# Patient Record
Sex: Male | Born: 1935
Health system: Southern US, Community
[De-identification: ages and names within clinical notes are randomized; demographics above are authoritative.]

## PROBLEM LIST (undated history)

## (undated) DIAGNOSIS — I509 Heart failure, unspecified: Secondary | ICD-10-CM

## (undated) DIAGNOSIS — E7800 Pure hypercholesterolemia, unspecified: Secondary | ICD-10-CM

## (undated) DIAGNOSIS — I639 Cerebral infarction, unspecified: Secondary | ICD-10-CM

## (undated) DIAGNOSIS — R7989 Other specified abnormal findings of blood chemistry: Secondary | ICD-10-CM

## (undated) DIAGNOSIS — C801 Malignant (primary) neoplasm, unspecified: Secondary | ICD-10-CM

## (undated) DIAGNOSIS — I10 Essential (primary) hypertension: Secondary | ICD-10-CM

## (undated) DIAGNOSIS — I2510 Atherosclerotic heart disease of native coronary artery without angina pectoris: Secondary | ICD-10-CM

## (undated) DIAGNOSIS — E119 Type 2 diabetes mellitus without complications: Secondary | ICD-10-CM

## (undated) DIAGNOSIS — E785 Hyperlipidemia, unspecified: Secondary | ICD-10-CM

## (undated) DIAGNOSIS — J189 Pneumonia, unspecified organism: Secondary | ICD-10-CM

## (undated) DIAGNOSIS — J449 Chronic obstructive pulmonary disease, unspecified: Secondary | ICD-10-CM

## (undated) DIAGNOSIS — M4800 Spinal stenosis, site unspecified: Secondary | ICD-10-CM

## (undated) DIAGNOSIS — I739 Peripheral vascular disease, unspecified: Secondary | ICD-10-CM

## (undated) DIAGNOSIS — R911 Solitary pulmonary nodule: Secondary | ICD-10-CM

## (undated) DIAGNOSIS — IMO0002 Reserved for concepts with insufficient information to code with codable children: Secondary | ICD-10-CM

## (undated) DIAGNOSIS — I4891 Unspecified atrial fibrillation: Secondary | ICD-10-CM

## (undated) DIAGNOSIS — E1121 Type 2 diabetes mellitus with diabetic nephropathy: Secondary | ICD-10-CM

## (undated) HISTORY — DX: Solitary pulmonary nodule: R91.1

## (undated) HISTORY — PX: TRIGGER FINGER RELEASE: SHX641

## (undated) HISTORY — DX: Malignant (primary) neoplasm, unspecified: C80.1

## (undated) HISTORY — DX: Spinal stenosis, site unspecified: M48.00

## (undated) HISTORY — DX: Chronic obstructive pulmonary disease, unspecified: J44.9

## (undated) HISTORY — DX: Heart failure, unspecified: I50.9

## (undated) HISTORY — PX: OTHER SURGICAL HISTORY: SHX169

## (undated) HISTORY — PX: KNEE ARTHROSCOPY: SUR90

## (undated) HISTORY — DX: Type 2 diabetes mellitus with diabetic nephropathy: E11.21

## (undated) HISTORY — DX: Unspecified atrial fibrillation: I48.91

## (undated) HISTORY — PX: CORONARY ARTERY BYPASS GRAFT: SHX141

---
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
## 2007-12-12 ENCOUNTER — Ambulatory Visit: Payer: Self-pay | Admitting: Infectious Diseases

## 2007-12-12 ENCOUNTER — Inpatient Hospital Stay (HOSPITAL_COMMUNITY): Admission: EM | Admit: 2007-12-12 | Discharge: 2007-12-14 | Payer: Self-pay | Admitting: Emergency Medicine

## 2007-12-14 ENCOUNTER — Ambulatory Visit: Payer: Self-pay | Admitting: Vascular Surgery

## 2007-12-14 ENCOUNTER — Encounter: Payer: Self-pay | Admitting: Infectious Diseases

## 2008-11-25 DIAGNOSIS — I639 Cerebral infarction, unspecified: Secondary | ICD-10-CM

## 2008-11-25 HISTORY — DX: Cerebral infarction, unspecified: I63.9

## 2011-04-09 NOTE — Consult Note (Signed)
NAME:  Gary, Odonnell NO.:  [id number]   MEDICAL RECORD NO.:  [id number]          PATIENT TYPE:  INP   LOCATION:  3019                         FACILITY:  [facility]   PHYSICIAN:  Gustavus Messing. Orlin Odonnell, M.D.DATE OF BIRTH:  1932-03-30   DATE OF CONSULTATION:  12/14/2007  DATE OF DISCHARGE:                                 CONSULTATION   CHIEF COMPLAINT:  Stroke   HISTORY OF PRESENT ILLNESS:  Gary Odonnell a 77 year old, right-handed  white male with a history of previous stroke in August 2008 affecting  his speech. According to the patient, he was seen at [facility]  in [place], admitted, had an MRI scan, was told that it showed a stroke,  and he was discharged on Plavix. The extent of the workup there is  unknown.  He says he did not have carotid Dopplers are 2-D  echocardiogram. However, he stopped the Plavix shortly after that  because of it making him black and blue.  A few days prior to  admission here, he again noticed that he had language problems,  specifically trouble finding words.  He called his primary physician who  recommended he come to the emergency room, but he was several days out  by that time. MRI to confirm an acute left MCA infarct with multiple  small infarctions like an embolic shower in the peri-insular region.  He  denied any vision changes, weakness, numbness, slurred speech but did  have some clumsiness on the right.   REVIEW OF SYSTEMS:  On a 13-system review, the patient has no specific  complaints at this time other than the speech difficulties. Does use  glasses. Was otherwise negative.  Please also see as outlined in the  History of Present Illness.   PAST MEDICAL HISTORY:  Significant for some mild hypercholesterolemia,  previous stroke, coronary artery disease status post a remote cavity.  Surgery in his knee and his hand.  He has some hypertension in addition  to the hyperlipidemia.   MEDICATIONS AS AN OUTPATIENT:  Included  Caduet.  He was not taking any  antiplatelet therapy, aspirin or Plavix at that time.  He was on  metoprolol   ALLERGIES:  No known drug allergies.   SOCIAL HISTORY:  He is married, has grown children who are healthy.  He  is retired. He is a smoker, smoked a pack a day up until this admission.  Rare alcohol.  No recreational drug use.   FAMILY HISTORY:  Positive for what sounds like CHF and pancreatic  cancer.   OBJECTIVE:  VITAL SIGNS:  Temperature is 97.6, pulse 65, respirations  20, blood pressure 126/73.  NECK:  Without bruits.  HEART:  Regular rate and rhythm.  NEUROLOGIC:  Exam normal except for a mild aphasia. On the stroke scale,  he scores 2.  He is alert, answers 1/2 questions correctly, follows 2/2  commands correctly. Has normal visual fields, normal gaze. No facial  weakness.  No drift in the upper extremities or lower extremities on  either side.  No ataxia on finger-to-nose or heel-to-shin. Normal  sensation. Mild  anomia on language testing.  He does well at reading  sentences but had some problems with naming pictures and describing a  scene in the picture. No dysarthria.  No extinction.   MRI of the brain shows multiple small acute infarcts in roughly 1 or 2  MCA branches of the insular region which are highly suspicious for  embolic events, either cardiac or carotid arteries uncertain. Also  unrelated vertebral disease.   Homocysteine level was normal 12.6. LDL was 77 with total cholesterol  137. Hemoglobin A1c was 6.3, just slightly high with some random glucose  mildly elevated.   A 2-D echocardiogram was done.  Results are pending.   Carotid Dopplers were done.  Preliminary shows no ICA stenosis.   MRA of the neck showed some vertebral disease and some mild left carotid  disease but no surgically significant stenosis.   IMPRESSION:  Left middle cerebral artery branch peri-insular infarct  with mild aphasia; otherwise without significant deficit. The  MRI  appearance is most consistent with an embolic event; for example,  cardiac or artery-to-artery.  It does not look like small vessel  disease.   RECOMMENDATIONS:  Need to follow up on the 2-D echocardiogram and the  formal carotid Doppler report. In the absence of a clear cardiac source  or carotid disease, which would indicate need for surgery, I would  recommend Aggrenox twice a day.  However, given that this certainly does  not look like small vessel event but rather an embolic branch pattern  type event, would look very closely for an embolic source. Stroke team  to follow.      Gary Odonnell, M.D.  Electronically Signed     CAW/MEDQ  D:  12/14/2007  T:  12/14/2007  Job:  [id number]

## 2011-04-12 NOTE — Discharge Summary (Signed)
NAME:  JEFFREY, GRAEFE NO.:  [id number]   MEDICAL RECORD NO.:  [id number]          PATIENT TYPE:  INP   LOCATION:  3019                         FACILITY:  [facility]   PHYSICIAN:  Zara Council, MD      DATE OF BIRTH:  08-26-1932   DATE OF ADMISSION:  12/12/2007  DATE OF DISCHARGE:  12/14/2007                               DISCHARGE SUMMARY   PRIMARY CARE PHYSICIAN:  Delaney Meigs, M.D.   DISCHARGE DIAGNOSES:  1. Dysphasia secondary to left periopercular and subinsular acute      nonhemorrhagic infarct.  2. History of hypertension.  3. Hyperlipidemia.  4. Coronary artery disease status post coronary artery bypass graft.  5. Right knee arthroscopic surgery.  6. History of trigger finger.   DISCHARGE MEDICATIONS:  1. Aggrenox 1 tablet twice a day.  2. Zocor 40 mg once a day at bedtime.  3. Please note that the patient's medications metoprolol and Caduet      have been placed on hold because of the patient's blood pressure.   DISPOSITION AND FOLLOWUP:  The patient is to follow up with Dr. Lysbeth Galas,  phone number [phone] on December 30, 2007, at 3:15 p.m.  Dr. Lysbeth Galas is  to address the patient's problem from primary care perspective.  Basically, his blood pressure needs monitoring and if it is too high, it  can be reinstituted.  The primary care physician is also requested to  coordinate with the patient's neurologist on any further intervention he  might need.   The patient will follow up with Dr. Orlin Hilding, phone number [phone] on  January 29, 2008, at 10 a.m.  The patient is to be reviewed for resolution  of his dysphasia as well as any intervention he might benefit from.   PROCEDURES:  1. Chest x-ray on December 12, 2007.  Impression:  The patient is      status post CABG.  Heart size is normal.  There is some mild      bibasilar atelectasis.  No pleural effusion or focal bony      abnormalities.  2. CT of the head without contrast on December 12, 2007.   Impression:      Findings worrisome for acute or subacute left frontal or temporal      infarct, acute right maxillary sinusitis with scattered ethmoid air      cell disease, and mild left maxillary sinus disease.  3. MRI brain without contrast.  Impression: Non-hemorrhagic acute      infarcts found across the left subinsular and left periopercular      region.  Moderate white-matter type changes suggestive of a result      of small-vessel disease.  Left vertebral artery appears abnormal,      which may be related to occlusion or atherosclerotic type changes,      paranasal sinus opacification.  4. MR angiogram head and neck without and with contrast, medications.      Impression:  Markedly diseased left vertebral artery suggestive of      proximal occlusal and distal narrowing and irregularity and  narrowing of the internal carotid arteries bilaterally, but without      evidence of hemodynamically significant stenosis.  Ulceration of      the proximal left internal carotid artery distal bulb level may be      present.  Focal moderate kink and narrowing of proximal right      subclavian artery.  Decreased number of left middle cerebral artery      branch vessels are visualized in the present exam consistent with      occlusion and narrowing leading to the patient's acute infarct.      Moderate narrowing of petrous segment of left internal carotid      artery, some atherosclerotic-type changes.  No aneurysm noted.  5. A 2D echo date December 14, 2007.  Impression:  Left ventricular      ejection fraction was estimated to range between 50% to 55%.  There      was mild paradoxical motion of the intraventricular septum      consistent with conduction abnormality of paced rhythm.  The aortic      valve was mildly to moderately calcified.  There was low normal      aortic valve regurgitation.  There was moderate mitral annular      calcification.  Left atrial size was at the upper limit of  normal.      The patient's echocardiography results were discussed with      cardiologist Dr. Eldridge Dace, and the patient did not have any obvious      cardiac source of embolism.   CONSULTATIONS:  Neurology Dr. Orlin Hilding.   ADMISSION HISTORY:  Mr. Legrand is a 77 year old gentleman with a history  of hypertension, hyperlipidemia, and active smoking with status post  CABG.  He also had a history of CVA in the past but was not on any  antiplatelet agents.  He started to have difficulty with his speech  about 4 days prior to admission.  He described it as slowness in his  speech and difficulty finding the right words with some slurring.  He  knew what he wanted to say but had difficulty speaking the word.  He  also mentioned of dropping things from his hand but denied any numbness  or weakness.  He also thought he was having some difficulty swallowing,  but he did not have any choking or aspiration.  Of note, he had had  similar episodes when he had a stroke in August 2008.  He denied any  headache, nausea, vomiting, visual, or hearing problem, trauma to head,  seizures, loss of consciousness, fever, chest pain, hypertension,  shortness of breath, or any other vague complaints.   ADMISSION PHYSICAL:  VITAL SIGNS:  Temperature 97.3, blood pressure  126/61, pulse 60, respiratory rate 16.  O2 sat 97% on room air.  GENERAL:  NAD.  HEENT:  Eyes, no icterus.  No pallor.  EOMI.  PERRLA.  ENT; moist mucous  membrane.  NECK:  Supple.  No organomegaly.  CHEST:  Clear to auscultation bilaterally.  CARDIOVASCULAR:  Regular rate and rhythm.  Normal heart sounds.  No  murmur, rubs, or gallops.  No carotid bruits.  GASTROINTESTINAL:  Soft,  nontender, and nondistended abdomen with positive bowel sounds.  EXTREMITIES:  No edema.  No calf swelling.  GENITOURINARY:  No CVA tenderness.  SKIN:  No rashes.  Normal turgor.  LYMPH:  No lymphadenopathy.  MUSCULOSKELETAL:  No spine or joint tenderness.   NEUROLOGIC:  Alert and oriented x3.  Cranial nerves II through XII  intact.  Power 5/5 on all 4 limbs.  Sensory was intact.  Cerebral  function was intact.  Reading, writing, and repetition was intact.  PSYCHIATRIC:  Appropriate.   ADMISSION LABS:  Sodium 139, potassium 4.1, chloride 105, bicarbonate  26.4, BUN 14, creatinine 0.9, blood glucose 112, hemoglobin 15.9, MCV  90.6, ANC 4.2, white cell 6.7, and platelets 194,000.      Zara Council, MD  Electronically Signed     AS/MEDQ  D:  12/15/2007  T:  12/16/2007  Job:  [id number]   cc:   Santina Evans A. Orlin Hilding, M.D.  Delaney Meigs, M.D.

## 2011-04-12 NOTE — Discharge Summary (Signed)
NAME:  Gary Odonnell, Gary Odonnell NO.:  [id number]   MEDICAL RECORD NO.:  [id number]          PATIENT TYPE:  INP   LOCATION:  3019                         FACILITY:  [facility]   PHYSICIAN:  Zara Council, MD      DATE OF BIRTH:  25-Apr-1932   DATE OF ADMISSION:  12/12/2007  DATE OF DISCHARGE:  12/14/2007                               DISCHARGE SUMMARY   ADDENDUM TO JOB ID: [id number]   HOSPITAL COURSE:  1. Dysphasia.  The patient was admitted for observation and further      workup on the cause of his speech problem.  He was placed on our      tele floor and a CAT scan as well as MRI examination of his brain      was done, the results of which are mentioned as above.  We started      the patient on aspirin as well as statin.  Audiological      consultation was done.  Dr. Cristy Friedlander recommended that the patient will      be placed on Aggrenox and advised to seek any cause leading to      thromboembolic event.  The patient also underwent carotid Doppler      and echocardiography examination, the results of which are      mentioned above.  Upon Dr. Rondel Oh recommendation, the patient is      sent home on Aggrenox and statin.  The patient had an unremarkable      hospital stay and his speech was near about his baseline on      discharge.  2. Hypertension.  The patient's blood pressure was within normal      limits when he came into the hospital.  We have held his blood      pressure medications for the time being, which is to be reviewed on      an outpatient basis.   DISCHARGE DAY VITALS:  Temperature 99 degrees Fahrenheit, blood pressure  136/70, pulse 62, and respiratory rate 18.   DISCHARGE DAY LABS:  Sodium 138, potassium 4, chloride 103, bicarbonate  26, BUN 11, creatinine 0.95, and blood glucose 111.  Hemoglobin 14.3,  white cell 7.6, and platelet 167,000.      Zara Council, MD  Electronically Signed     AS/MEDQ  D:  12/15/2007  T:  12/16/2007  Job:  [id number]   cc:    Delaney Meigs, M.D.  Catherine A. Orlin Hilding, M.D.

## 2011-08-16 LAB — I-STAT 8, (EC8 V) (CONVERTED LAB)
BUN: 14
Bicarbonate: 26.4 — ABNORMAL HIGH
Hemoglobin: 16.7
Operator id: 294521
Sodium: 139
TCO2: 28

## 2011-08-16 LAB — COMPREHENSIVE METABOLIC PANEL
ALT: 19
AST: 19
Albumin: 3.7
Alkaline Phosphatase: 54
GFR calc Af Amer: >60
Potassium: 4
Sodium: 138
Total Protein: 6.7

## 2011-08-16 LAB — BASIC METABOLIC PANEL
BUN: 13
CO2: 28
Chloride: 103
Creatinine, Ser: 1
GFR calc Af Amer: >60
Potassium: 4.1

## 2011-08-16 LAB — CBC
HCT: 41.8
HCT: 46
Hemoglobin: 15.9
MCHC: 34.2
MCHC: 34.5
MCV: 90.6
MCV: 91.1
RBC: 4.59
RDW: 13.9

## 2011-08-16 LAB — APTT: aPTT: 34

## 2011-08-16 LAB — CK TOTAL AND CKMB (NOT AT ARMC): Relative Index: 3.6 — ABNORMAL HIGH

## 2011-08-16 LAB — CARDIAC PANEL(CRET KIN+CKTOT+MB+TROPI)
CK, MB: 3
Total CK: 89

## 2011-08-16 LAB — DRUGS OF ABUSE SCREEN W/O ALC, ROUTINE URINE
Amphetamine Screen, Ur: NEGATIVE
Barbiturate Quant, Ur: NEGATIVE
Benzodiazepines.: NEGATIVE
Marijuana Metabolite: NEGATIVE
Phencyclidine (PCP): NEGATIVE

## 2011-08-16 LAB — LIPID PANEL
Cholesterol: 137
LDL Cholesterol: 77

## 2011-08-16 LAB — DIFFERENTIAL
Basophils Absolute: 0.1
Basophils Relative: 1
Eosinophils Relative: 4
Lymphocytes Relative: 27
Monocytes Absolute: 0.3

## 2011-08-16 LAB — PROTIME-INR: INR: 1

## 2011-08-16 LAB — POCT CARDIAC MARKERS: Troponin i, poc: <0.05

## 2013-08-06 ENCOUNTER — Encounter (HOSPITAL_COMMUNITY): Payer: Self-pay | Admitting: *Deleted

## 2013-08-06 ENCOUNTER — Emergency Department (HOSPITAL_COMMUNITY)
Admission: EM | Admit: 2013-08-06 | Discharge: 2013-08-06 | Disposition: A | Discharge status: Home / Self Care | Payer: Medicare Other | Attending: Emergency Medicine | Admitting: Emergency Medicine

## 2013-08-06 ENCOUNTER — Emergency Department (HOSPITAL_COMMUNITY): Payer: Medicare Other

## 2013-08-06 DIAGNOSIS — I2510 Atherosclerotic heart disease of native coronary artery without angina pectoris: Secondary | ICD-10-CM | POA: Insufficient documentation

## 2013-08-06 DIAGNOSIS — I10 Essential (primary) hypertension: Secondary | ICD-10-CM | POA: Insufficient documentation

## 2013-08-06 DIAGNOSIS — Z7982 Long term (current) use of aspirin: Secondary | ICD-10-CM | POA: Insufficient documentation

## 2013-08-06 DIAGNOSIS — F17200 Nicotine dependence, unspecified, uncomplicated: Secondary | ICD-10-CM | POA: Insufficient documentation

## 2013-08-06 DIAGNOSIS — Z951 Presence of aortocoronary bypass graft: Secondary | ICD-10-CM | POA: Insufficient documentation

## 2013-08-06 DIAGNOSIS — M792 Neuralgia and neuritis, unspecified: Secondary | ICD-10-CM

## 2013-08-06 DIAGNOSIS — IMO0002 Reserved for concepts with insufficient information to code with codable children: Secondary | ICD-10-CM | POA: Insufficient documentation

## 2013-08-06 DIAGNOSIS — Z8673 Personal history of transient ischemic attack (TIA), and cerebral infarction without residual deficits: Secondary | ICD-10-CM | POA: Insufficient documentation

## 2013-08-06 HISTORY — DX: Atherosclerotic heart disease of native coronary artery without angina pectoris: I25.10

## 2013-08-06 HISTORY — DX: Cerebral infarction, unspecified: I63.9

## 2013-08-06 HISTORY — DX: Essential (primary) hypertension: I10

## 2013-08-06 MED ORDER — NAPROXEN 500 MG PO TABS: 500.0000 mg | ORAL_TABLET | Freq: Two times a day (BID) | ORAL | Status: DC

## 2013-08-06 NOTE — ED Notes (Signed)
Instructions, prescriptions, and f/u information given/reviewed - verbalizes understanding.  

## 2013-08-06 NOTE — ED Provider Notes (Signed)
CSN: [id number]     Arrival date & time 08/06/13  9147 History  This chart was scribed for Gary Lennert, MD by Quintella Reichert, ED scribe.  This patient was seen in room APA05/APA05 and the patient's care was started at 10:06 AM.  Chief Complaint  Patient presents with  . Numbness    Patient is a 79 y.o. male presenting with neurologic complaint. The history is provided by the patient. No language interpreter was used.  Neurologic Problem This is a new problem. The current episode started more than 1 week ago. The problem occurs constantly. Pertinent negatives include no chest pain, no abdominal pain, no headaches and no shortness of breath. Associated symptoms comments: Denies weakness. Nothing aggravates the symptoms. Nothing relieves the symptoms. He has tried nothing for the symptoms.    HPI Comments: Gary Odonnell is a 79 y.o. male with h/o stroke, HTN and CAD who presents to the Emergency Department complaining of 7-8 weeks of persistent numbness to his left hand and left cheek.  Pt states that the day before his symptoms began he was riding his lawn mower while holding his dog on a leash with his left hand and his dog pulled on the leash hard enough to stand pt up in the lawn mower and almost turn the mower over.  Numbness is localized to the left cheek and the left arm only from the left wrist down.  It is described as "like someone took a shot of Novocain and put it in my cheek."  He denies weakness in the arm, hand or fingers.  He states he has full ROM to the hand and has been able to drive and do yard work without difficulty.   Past Medical History  Diagnosis Date  . Stroke   . Coronary artery disease   . Hypertension     Past Surgical History  Procedure Laterality Date  . Coronary artery bypass graft    . Knee arthroscopy      No family history on file.   History  Substance Use Topics  . Smoking status: Current Every Day Smoker -- 1.00 packs/day    Types: Cigars   . Smokeless tobacco: Not on file  . Alcohol Use: No     Review of Systems  Constitutional: Negative for appetite change and fatigue.  HENT: Negative for congestion, sinus pressure and ear discharge.   Eyes: Negative for discharge.  Respiratory: Negative for cough and shortness of breath.   Cardiovascular: Negative for chest pain.  Gastrointestinal: Negative for abdominal pain and diarrhea.  Genitourinary: Negative for frequency and hematuria.  Musculoskeletal: Negative for back pain.  Skin: Negative for rash.  Neurological: Positive for numbness. Negative for seizures, weakness and headaches.  Psychiatric/Behavioral: Negative for hallucinations.    Allergies  Review of patient's allergies indicates no known allergies.  Home Medications   Current Outpatient Rx  Name  Route  Sig  Dispense  Refill  . aspirin 325 MG tablet   Oral   Take 650 mg by mouth daily.          BP 184/90  Temp(Src) 97.9 F (36.6 C) (Oral)  Resp 20  Ht 6' (1.829 m)  Wt 188 lb (85.276 kg)  BMI 25.49 kg/m2  SpO2 99%  Physical Exam  Nursing note and vitals reviewed. Constitutional: He is oriented to person, place, and time. He appears well-developed.  HENT:  Head: Normocephalic.  Eyes: Conjunctivae and EOM are normal. No scleral icterus.  Neck: Neck supple.  No thyromegaly present.  Cardiovascular: Normal rate and regular rhythm.  Exam reveals no gallop and no friction rub.   No murmur heard. Pulmonary/Chest: No stridor. He has no wheezes. He has no rales. He exhibits no tenderness.  Abdominal: He exhibits no distension. There is no tenderness. There is no rebound.  Musculoskeletal: Normal range of motion. He exhibits no edema.  Lymphadenopathy:    He has no cervical adenopathy.  Neurological: He is alert and oriented to person, place, and time. A sensory deficit is present. Coordination normal.  Mildly decreased sensation to left side of face and to 2nd and 3rd fingers on left hand Minimal  decreased strength to left hand  Skin: No rash noted. No erythema.  Psychiatric: He has a normal mood and affect. His behavior is normal.    ED Course  Procedures (including critical care time)  DIAGNOSTIC STUDIES: Oxygen Saturation is 99% on room air, normal by my interpretation.    COORDINATION OF CARE: 10:16 AM: Discussed treatment plan which includes head and neck imaging.  Pt expressed understanding and agreed to plan.   Labs Review Labs Reviewed - No data to display  Imaging Review Ct Head Wo Contrast  08/06/2013   *RADIOLOGY REPORT*  Clinical Data:  Left arm numbness  CT HEAD WITHOUT CONTRAST CT CERVICAL SPINE WITHOUT CONTRAST  Technique:  Multidetector CT imaging of the head and cervical spine was performed following the standard protocol without intravenous contrast.  Multiplanar CT image reconstructions of the cervical spine were also generated.  Comparison:  MRI head 12/12/2007  CT HEAD  Findings: Generalized atrophy.  Chronic left frontal lobe infarct. Chronic microvascular ischemic change in the white matter.  Negative for acute infarct.  Negative for hemorrhage or mass lesion.  Mucosal edema left sphenoid sinus.  No acute skull lesion.  IMPRESSION: Chronic left frontal infarct.  No acute abnormality.  CT CERVICAL SPINE  Findings: Negative for fracture or mass in the cervical spine. Carotid atherosclerotic disease is present bilaterally.  No soft tissue mass or adenopathy.  C2-3:  Mild disc bulging.  Mild uncinate spurring on the left with left-sided facet hypertrophy.  No significant foraminal encroachment  C3-4:  Disc degeneration and spondylosis.  Marked facet hypertrophy on the right causing right foraminal encroachment.  Mild spinal stenosis  C4-5:  Disc degeneration and spondylosis.  Posterior ligament ossification.  Moderate facet hypertrophy on the right with right foraminal encroachment.  Mild spinal stenosis  C5-6:  Disc degeneration and spondylosis causing moderate spinal  stenosis.  There is bilateral facet hypertrophy and moderate foraminal encroachment bilaterally.  C6-7:  Mild facet degeneration without stenosis  C7-T1:  Negative  IMPRESSION: Negative for fracture or mass.  Chronic cervical spondylosis and facet degeneration as above.   Original Report Authenticated By: Janeece Riggers, M.D.   Ct Cervical Spine Wo Contrast  08/06/2013   *RADIOLOGY REPORT*  Clinical Data:  Left arm numbness  CT HEAD WITHOUT CONTRAST CT CERVICAL SPINE WITHOUT CONTRAST  Technique:  Multidetector CT imaging of the head and cervical spine was performed following the standard protocol without intravenous contrast.  Multiplanar CT image reconstructions of the cervical spine were also generated.  Comparison:  MRI head 12/12/2007  CT HEAD  Findings: Generalized atrophy.  Chronic left frontal lobe infarct. Chronic microvascular ischemic change in the white matter.  Negative for acute infarct.  Negative for hemorrhage or mass lesion.  Mucosal edema left sphenoid sinus.  No acute skull lesion.  IMPRESSION: Chronic left frontal infarct.  No  acute abnormality.  CT CERVICAL SPINE  Findings: Negative for fracture or mass in the cervical spine. Carotid atherosclerotic disease is present bilaterally.  No soft tissue mass or adenopathy.  C2-3:  Mild disc bulging.  Mild uncinate spurring on the left with left-sided facet hypertrophy.  No significant foraminal encroachment  C3-4:  Disc degeneration and spondylosis.  Marked facet hypertrophy on the right causing right foraminal encroachment.  Mild spinal stenosis  C4-5:  Disc degeneration and spondylosis.  Posterior ligament ossification.  Moderate facet hypertrophy on the right with right foraminal encroachment.  Mild spinal stenosis  C5-6:  Disc degeneration and spondylosis causing moderate spinal stenosis.  There is bilateral facet hypertrophy and moderate foraminal encroachment bilaterally.  C6-7:  Mild facet degeneration without stenosis  C7-T1:  Negative   IMPRESSION: Negative for fracture or mass.  Chronic cervical spondylosis and facet degeneration as above.   Original Report Authenticated By: Janeece Riggers, M.D.    MDM  Neuritits,  tx with NSAD  And follow up   The chart was scribed for me under my direct supervision.  I personally performed the history, physical, and medical decision making and all procedures in the evaluation of this patient.Gary Lennert, MD 08/06/13 [phone]

## 2013-08-06 NOTE — ED Notes (Signed)
Reports left hand numbness and left cheek numbness x 7-8 weeks.  States was riding his [employer] when mower almost turned over - c/o numbness and decreased use of left hand and left cheek numbness since that time.

## 2013-09-13 ENCOUNTER — Encounter (HOSPITAL_COMMUNITY): Payer: Self-pay | Admitting: Emergency Medicine

## 2013-09-13 ENCOUNTER — Emergency Department (HOSPITAL_COMMUNITY)
Admission: EM | Admit: 2013-09-13 | Discharge: 2013-09-13 | Disposition: A | Discharge status: Home / Self Care | Payer: Medicare Other | Attending: Emergency Medicine | Admitting: Emergency Medicine

## 2013-09-13 ENCOUNTER — Emergency Department (HOSPITAL_COMMUNITY): Payer: Medicare Other

## 2013-09-13 DIAGNOSIS — I10 Essential (primary) hypertension: Secondary | ICD-10-CM | POA: Insufficient documentation

## 2013-09-13 DIAGNOSIS — I2510 Atherosclerotic heart disease of native coronary artery without angina pectoris: Secondary | ICD-10-CM | POA: Insufficient documentation

## 2013-09-13 DIAGNOSIS — Z8673 Personal history of transient ischemic attack (TIA), and cerebral infarction without residual deficits: Secondary | ICD-10-CM | POA: Insufficient documentation

## 2013-09-13 DIAGNOSIS — R209 Unspecified disturbances of skin sensation: Secondary | ICD-10-CM | POA: Insufficient documentation

## 2013-09-13 DIAGNOSIS — J189 Pneumonia, unspecified organism: Secondary | ICD-10-CM | POA: Insufficient documentation

## 2013-09-13 DIAGNOSIS — R0989 Other specified symptoms and signs involving the circulatory and respiratory systems: Secondary | ICD-10-CM | POA: Insufficient documentation

## 2013-09-13 DIAGNOSIS — Z951 Presence of aortocoronary bypass graft: Secondary | ICD-10-CM | POA: Insufficient documentation

## 2013-09-13 DIAGNOSIS — Z7982 Long term (current) use of aspirin: Secondary | ICD-10-CM | POA: Insufficient documentation

## 2013-09-13 DIAGNOSIS — F17200 Nicotine dependence, unspecified, uncomplicated: Secondary | ICD-10-CM | POA: Insufficient documentation

## 2013-09-13 LAB — CBC WITH DIFFERENTIAL/PLATELET
Basophils Relative: 0 % (ref 0–1)
Eosinophils Absolute: 0.2 10*3/uL (ref 0.0–0.7)
HCT: 44.2 % (ref 39.0–52.0)
Hemoglobin: 14.9 g/dL (ref 13.0–17.0)
Lymphs Abs: 1.4 10*3/uL (ref 0.7–4.0)
MCH: 31 pg (ref 26.0–34.0)
MCHC: 33.7 g/dL (ref 30.0–36.0)
Monocytes Absolute: 0.3 10*3/uL (ref 0.1–1.0)
Monocytes Relative: 5 % (ref 3–12)

## 2013-09-13 LAB — BASIC METABOLIC PANEL
BUN: 16 mg/dL (ref 6–23)
CO2: 28 mEq/L (ref 19–32)
Chloride: 105 mEq/L (ref 96–112)
Glucose, Bld: 129 mg/dL — ABNORMAL HIGH (ref 70–99)
Potassium: 3.7 mEq/L (ref 3.5–5.1)

## 2013-09-13 MED ORDER — AZITHROMYCIN 250 MG PO TABS: ORAL_TABLET | ORAL | Status: DC

## 2013-09-13 MED ORDER — ALBUTEROL SULFATE (5 MG/ML) 0.5% IN NEBU
5.0000 mg | INHALATION_SOLUTION | Freq: Once | RESPIRATORY_TRACT | Status: AC
  Administered 2013-09-13: 5 mg via RESPIRATORY_TRACT
  Filled 2013-09-13: qty 1

## 2013-09-13 MED ORDER — DEXTROSE 5 % IV SOLN
500.0000 mg | Freq: Once | INTRAVENOUS | Status: AC
  Administered 2013-09-13: 500 mg via INTRAVENOUS

## 2013-09-13 MED ORDER — IPRATROPIUM BROMIDE 0.02 % IN SOLN
0.5000 mg | Freq: Once | RESPIRATORY_TRACT | Status: AC
  Administered 2013-09-13: 0.5 mg via RESPIRATORY_TRACT
  Filled 2013-09-13: qty 2.5

## 2013-09-13 MED ORDER — ALBUTEROL SULFATE HFA 108 (90 BASE) MCG/ACT IN AERS: 1.0000 | INHALATION_SPRAY | Freq: Four times a day (QID) | RESPIRATORY_TRACT | Status: DC | PRN

## 2013-09-13 MED ORDER — DEXTROSE 5 % IV SOLN
1.0000 g | Freq: Once | INTRAVENOUS | Status: AC
  Administered 2013-09-13: 1 g via INTRAVENOUS
  Filled 2013-09-13: qty 10

## 2013-09-13 NOTE — ED Notes (Signed)
Chest congestion x 1 week.  Short of breath which is amplified by lying on L side.  Denies productive cough, chills, n/v/d.

## 2013-09-13 NOTE — ED Provider Notes (Signed)
CSN: [id number]     Arrival date & time 09/13/13  0932 History  This chart was scribed for Donnetta Hutching, MD by Bennett Scrape, ED Scribe. This patient was seen in room APA19/APA19 and the patient's care was started at 11:35 AM.     Chief Complaint  Patient presents with  . Shortness of Breath    The history is provided by the patient. No language interpreter was used.   HPI Comments: Gary Odonnell is a 79 y.o. male who presents to the Emergency Department complaining of persistent SOB with associated chest congestion and NP cough for the past week. The SOB is worsened by laying on his left side. He states that his breathing has been normal but he just subjectively feels like he can't catch his breath. He states that the SOB is triggered with exertion over short distances. Currently he is unable to walk from his recliner to the bathroom without becoming SOB. He denies any recent fevers, chills, nausea, emesis and diarrhea. He is a 1ppd "little" cigar smoker. He denies being on any oxygen at home currently.    He also c/o secondary complaint of left hand numbness for the past three months. He states that he has been seen for the same with the original onset at [facility] and was prescribed prednisone and antibiotics. He states that he disagreed with the diagnosis and threw all of the medications away. He was seen at [facility] 3 weeks ago as well and was diagnosed with nerve damage. He was prescribed Naprosyn without improvement. He states that he works with his hands on a daily basis and believes that the nerve damage diagnosis is more congruent with his symptoms.  PCP is Dr. Lysbeth Galas  Past Medical History  Diagnosis Date  . Stroke   . Coronary artery disease   . Hypertension    Past Surgical History  Procedure Laterality Date  . Coronary artery bypass graft    . Knee arthroscopy    . Trigger finger release Bilateral    History reviewed. No pertinent family history. History  Substance Use Topics  .  Smoking status: Current Every Day Smoker -- 1.00 packs/day    Types: Cigars  . Smokeless tobacco: Not on file  . Alcohol Use: No    Review of Systems  A complete 10 system review of systems was obtained and all systems are negative except as noted in the HPI and PMH.   Allergies  Review of patient's allergies indicates no known allergies.  Home Medications   Current Outpatient Rx  Name  Route  Sig  Dispense  Refill  . aspirin 325 MG tablet   Oral   Take 650 mg by mouth daily.         Marland Kitchen guaiFENesin (MUCINEX) 600 MG 12 hr tablet   Oral   Take 600 mg by mouth daily.          Triage Vitals: BP 169/75  Pulse 78  Temp(Src) 98.2 F (36.8 C) (Oral)  Resp 17  Ht 5\' 11"  (1.803 m)  Wt 188 lb (85.276 kg)  BMI 26.23 kg/m2  SpO2 97%  Physical Exam  Nursing note and vitals reviewed. Constitutional: He is oriented to person, place, and time. He appears well-developed and well-nourished.  HENT:  Head: Normocephalic and atraumatic.  Eyes: Conjunctivae and EOM are normal. Pupils are equal, round, and reactive to light.  Neck: Normal range of motion. Neck supple.  Cardiovascular: Normal rate, regular rhythm and normal heart sounds.  Pulmonary/Chest: Effort normal and breath sounds normal.  Abdominal: Soft. Bowel sounds are normal.  Musculoskeletal: Normal range of motion.  Neurological: He is alert and oriented to person, place, and time.  Skin: Skin is warm and dry.  Psychiatric: He has a normal mood and affect.    ED Course  Procedures (including critical care time)  DIAGNOSTIC STUDIES: Oxygen Saturation is 97% on room air, normal by my interpretation.    COORDINATION OF CARE: 11:43 AM-Discussed treatment plan which includes CXR and breathing treatment with pt at bedside and pt agreed to plan.   Labs Review Labs Reviewed  BASIC METABOLIC PANEL - Abnormal; Notable for the following:    Glucose, Bld 129 (*)    GFR calc non Af Amer 77 (*)    GFR calc Af Amer 89 (*)     All other components within normal limits  CBC WITH DIFFERENTIAL   Imaging Review Dg Chest 2 View  09/13/2013   CLINICAL DATA:  Shortness of breath. Weakness.  EXAM: CHEST  2 VIEW  COMPARISON:  12/12/2007  FINDINGS: Left pleural effusion noted with adjacent airspace opacity. mild cardiomegaly. Right costophrenic angle omitted on the lateral projection.  Prior median sternotomy. No overt edema. Mild lingular airspace opacity.  IMPRESSION: 1. Left pleural effusion with adjacent airspace opacity in the left lower lobe and lingula which may reflect atelectasis or pneumonia. Cannot exclude a trace right pleural effusion; the right lateral costophrenic angle was omitted. 2. Mild cardiomegaly, without edema.   Electronically Signed   By: Herbie Baltimore M.D.   On: 09/13/2013 12:25    EKG Interpretation     Ventricular Rate:  82 PR Interval:  214 QRS Duration: 124 QT Interval:  400 QTC Calculation: 467 R Axis:   -55 Text Interpretation:  Sinus rhythm with sinus arrhythmia with 1st degree A-V block Left axis deviation Left ventricular hypertrophy with QRS widening and repolarization abnormality Abnormal ECG When compared with ECG of 13-Dec-2007 06:03, Significant changes have occurred            MDM  No diagnosis found. Patient is oxygenating well. Chest x-ray reveals a left pleural effusion with adjacent airspace opacity in the left lower lobe and lingula. Rx IV Rocephin, IV Zithromax. Discharge home on by mouth Zithromax and inhaler. Patient understands needs to get a repeat chest x-ray in 3 weeks to rule out cancer   I personally performed the services described in this documentation, which was scribed in my presence. The recorded information has been reviewed and is accurate.    Donnetta Hutching, MD 09/13/13 [phone]

## 2013-09-21 ENCOUNTER — Emergency Department (HOSPITAL_COMMUNITY)
Admission: EM | Admit: 2013-09-21 | Discharge: 2013-09-21 | Disposition: A | Discharge status: Home / Self Care | Payer: Medicare Other | Attending: Emergency Medicine | Admitting: Emergency Medicine

## 2013-09-21 ENCOUNTER — Emergency Department (HOSPITAL_COMMUNITY): Payer: Medicare Other

## 2013-09-21 ENCOUNTER — Encounter (HOSPITAL_COMMUNITY): Payer: Self-pay | Admitting: Emergency Medicine

## 2013-09-21 DIAGNOSIS — Z79899 Other long term (current) drug therapy: Secondary | ICD-10-CM | POA: Insufficient documentation

## 2013-09-21 DIAGNOSIS — J449 Chronic obstructive pulmonary disease, unspecified: Secondary | ICD-10-CM

## 2013-09-21 DIAGNOSIS — I2510 Atherosclerotic heart disease of native coronary artery without angina pectoris: Secondary | ICD-10-CM | POA: Insufficient documentation

## 2013-09-21 DIAGNOSIS — F17200 Nicotine dependence, unspecified, uncomplicated: Secondary | ICD-10-CM | POA: Insufficient documentation

## 2013-09-21 DIAGNOSIS — R209 Unspecified disturbances of skin sensation: Secondary | ICD-10-CM | POA: Insufficient documentation

## 2013-09-21 DIAGNOSIS — I10 Essential (primary) hypertension: Secondary | ICD-10-CM | POA: Insufficient documentation

## 2013-09-21 DIAGNOSIS — J441 Chronic obstructive pulmonary disease with (acute) exacerbation: Secondary | ICD-10-CM | POA: Insufficient documentation

## 2013-09-21 DIAGNOSIS — R911 Solitary pulmonary nodule: Secondary | ICD-10-CM | POA: Insufficient documentation

## 2013-09-21 DIAGNOSIS — Z951 Presence of aortocoronary bypass graft: Secondary | ICD-10-CM | POA: Insufficient documentation

## 2013-09-21 DIAGNOSIS — J90 Pleural effusion, not elsewhere classified: Secondary | ICD-10-CM | POA: Insufficient documentation

## 2013-09-21 DIAGNOSIS — Z8673 Personal history of transient ischemic attack (TIA), and cerebral infarction without residual deficits: Secondary | ICD-10-CM | POA: Insufficient documentation

## 2013-09-21 DIAGNOSIS — Z7982 Long term (current) use of aspirin: Secondary | ICD-10-CM | POA: Insufficient documentation

## 2013-09-21 DIAGNOSIS — J189 Pneumonia, unspecified organism: Secondary | ICD-10-CM | POA: Insufficient documentation

## 2013-09-21 HISTORY — DX: Pneumonia, unspecified organism: J18.9

## 2013-09-21 LAB — CBC WITH DIFFERENTIAL/PLATELET
Basophils Absolute: 0 10*3/uL (ref 0.0–0.1)
Basophils Relative: 1 % (ref 0–1)
Eosinophils Absolute: 0.1 10*3/uL (ref 0.0–0.7)
Eosinophils Relative: 2 % (ref 0–5)
HCT: 43.6 % (ref 39.0–52.0)
Hemoglobin: 14.4 g/dL (ref 13.0–17.0)
Lymphocytes Relative: 16 % (ref 12–46)
Lymphs Abs: 1 10*3/uL (ref 0.7–4.0)
MCH: 30.4 pg (ref 26.0–34.0)
MCHC: 33 g/dL (ref 30.0–36.0)
MCV: 92 fL (ref 78.0–100.0)
Monocytes Absolute: 0.3 10*3/uL (ref 0.1–1.0)
Monocytes Relative: 6 % (ref 3–12)
Neutro Abs: 4.7 10*3/uL (ref 1.7–7.7)
Neutrophils Relative %: 76 % (ref 43–77)
Platelets: 173 10*3/uL (ref 150–400)
RBC: 4.74 MIL/uL (ref 4.22–5.81)
RDW: 14.3 % (ref 11.5–15.5)
WBC: 6.2 10*3/uL (ref 4.0–10.5)

## 2013-09-21 LAB — BASIC METABOLIC PANEL
BUN: 16 mg/dL (ref 6–23)
CO2: 29 mEq/L (ref 19–32)
Calcium: 9.9 mg/dL (ref 8.4–10.5)
Chloride: 102 mEq/L (ref 96–112)
Creatinine, Ser: 1.04 mg/dL (ref 0.50–1.35)
GFR calc Af Amer: 77 mL/min — ABNORMAL LOW (ref >90)
GFR calc non Af Amer: 67 mL/min — ABNORMAL LOW (ref >90)
Glucose, Bld: 195 mg/dL — ABNORMAL HIGH (ref 70–99)
Potassium: 3.8 mEq/L (ref 3.5–5.1)
Sodium: 140 mEq/L (ref 135–145)

## 2013-09-21 MED ORDER — ALBUTEROL SULFATE HFA 108 (90 BASE) MCG/ACT IN AERS: 1.0000 | INHALATION_SPRAY | Freq: Four times a day (QID) | RESPIRATORY_TRACT | Status: DC | PRN

## 2013-09-21 MED ORDER — SODIUM CHLORIDE 0.9 % IV BOLUS (SEPSIS)
250.0000 mL | Freq: Once | INTRAVENOUS | Status: AC
  Administered 2013-09-21: 250 mL via INTRAVENOUS

## 2013-09-21 MED ORDER — IPRATROPIUM BROMIDE 0.02 % IN SOLN
0.5000 mg | Freq: Once | RESPIRATORY_TRACT | Status: AC
  Administered 2013-09-21: 0.5 mg via RESPIRATORY_TRACT
  Filled 2013-09-21: qty 2.5

## 2013-09-21 MED ORDER — ALBUTEROL SULFATE (5 MG/ML) 0.5% IN NEBU
2.5000 mg | INHALATION_SOLUTION | Freq: Once | RESPIRATORY_TRACT | Status: AC
  Administered 2013-09-21: 2.5 mg via RESPIRATORY_TRACT
  Filled 2013-09-21: qty 0.5

## 2013-09-21 MED ORDER — IOHEXOL 350 MG/ML SOLN
100.0000 mL | Freq: Once | INTRAVENOUS | Status: AC | PRN
  Administered 2013-09-21: 100 mL via INTRAVENOUS

## 2013-09-21 MED ORDER — SODIUM CHLORIDE 0.9 % IV SOLN
INTRAVENOUS | Status: DC
  Administered 2013-09-21: 12:00:00 via INTRAVENOUS

## 2013-09-21 NOTE — ED Notes (Signed)
Pt maintained O2% of at least 95% on room air while ambulating in hallway. Pt did not become SOB and tolerated ambulation well. MD notified.

## 2013-09-21 NOTE — ED Notes (Signed)
Here last week and dx with PNA. States has finished antibiotics and feels worse. States sob is worse also. Pt appears less sob and weak than last visit but states he is worse. No resp distress or sob noted. Pt states to bathroom and back wears him out. Intermittent cough with white phlegm. Alert/oriented.

## 2013-09-21 NOTE — ED Notes (Signed)
Pt c/o SOB x1-2 weeks. Pt states he was seen here last week and dx with pneumonia. Pt was given abx for tx but denies any relief of symptoms. Pt denies chest pain, headache, NVD.

## 2013-09-21 NOTE — ED Provider Notes (Signed)
CSN: [id number]     Arrival date & time 09/21/13  4098 History   This chart was scribed for Shelda Jakes, MD, by Yevette Edwards, ED Scribe. This patient was seen in room APA06/APA06 and the patient's care was started at 9:23 AM. First MD Initiated Contact with Patient 09/21/13 0848     Chief Complaint  Patient presents with  . Shortness of Breath    Patient is a 79 y.o. male presenting with shortness of breath. The history is provided by the patient. No language interpreter was used.  Shortness of Breath Severity:  Moderate Onset quality:  Gradual Duration:  2 weeks Timing:  Intermittent Progression:  Worsening Chronicity:  Recurrent Context: activity and URI   Worsened by:  Activity, exertion and coughing Associated symptoms: chest pain and cough   Associated symptoms: no abdominal pain, no fever, no headaches, no neck pain, no rash and no vomiting   Risk factors: tobacco use    HPI Comments: Lenwood Balsam is a 79 y.o. male, with a h/o pneumonia and CAD, who presents to the Emergency Department complaining of gradually-increasing dyspnea which began two weeks ago. The pt was diagnosed with pneumonia 8 days ago; he was prescribed antibiotics, but he has not had any improvement. As associated symptoms, he has experienced a productive cough, congestion, and post-nasal drip. He used Mucinex this morning with some relief to his cough and congestion. He reports that the congestion increase the dyspnea. He does not use oxygen at home, but he reports that he would use oxygen at home if he had it.  He is a daily smoker.   Pt reports that his left hand and left side of his face have been numb for several months since an accident in which his dog's leash tightened around his left wrist.   The pt stopped taking coumadin a year ago because of increased bruising.   Dr. Denzil Magnuson is the pt's PCP.   Past Medical History  Diagnosis Date  . Stroke   . Coronary artery disease   . Hypertension   .  Pneumonia    Past Surgical History  Procedure Laterality Date  . Coronary artery bypass graft    . Knee arthroscopy    . Trigger finger release Bilateral    History reviewed. No pertinent family history. History  Substance Use Topics  . Smoking status: Current Every Day Smoker -- 1.00 packs/day    Types: Cigars  . Smokeless tobacco: Not on file  . Alcohol Use: No    Review of Systems  Constitutional: Negative for fever and chills.  HENT: Positive for congestion and postnasal drip. Negative for sinus pressure.   Eyes: Negative for visual disturbance.  Respiratory: Positive for cough and shortness of breath.   Cardiovascular: Positive for chest pain. Negative for leg swelling.  Gastrointestinal: Negative for nausea, vomiting, abdominal pain and diarrhea.  Genitourinary: Negative for dysuria.  Musculoskeletal: Negative for back pain and neck pain.  Skin: Negative for rash.  Neurological: Positive for numbness (Left hand and left face. ). Negative for headaches.  Hematological: Does not bruise/bleed easily.    Allergies  Review of patient's allergies indicates no known allergies.  Home Medications   Current Outpatient Rx  Name  Route  Sig  Dispense  Refill  . albuterol (PROVENTIL HFA;VENTOLIN HFA) 108 (90 BASE) MCG/ACT inhaler   Inhalation   Inhale 1-2 puffs into the lungs every 6 (six) hours as needed for wheezing.   1 Inhaler   0   .  aspirin 325 MG tablet   Oral   Take 650 mg by mouth daily.         Marland Kitchen guaiFENesin (MUCINEX) 600 MG 12 hr tablet   Oral   Take 600 mg by mouth daily.         Marland Kitchen albuterol (PROVENTIL HFA;VENTOLIN HFA) 108 (90 BASE) MCG/ACT inhaler   Inhalation   Inhale 1-2 puffs into the lungs every 6 (six) hours as needed for wheezing.   1 Inhaler   0   . azithromycin (ZITHROMAX Z-PAK) 250 MG tablet      2 po day one, then 1 daily x 4 days   5 tablet   0    Triage Vitals: BP 151/88  Pulse 87  Temp(Src) 97.8 F (36.6 C) (Oral)  Resp 23   SpO2 91%  Physical Exam  Nursing note and vitals reviewed. Constitutional: He is oriented to person, place, and time. He appears well-developed and well-nourished. No distress.  HENT:  Head: Normocephalic and atraumatic.  Eyes: EOM are normal.  Neck: Neck supple. No tracheal deviation present.  Cardiovascular: Normal rate, regular rhythm and normal heart sounds.   No murmur heard. Pulmonary/Chest: Effort normal. No respiratory distress. He has wheezes (Right sided).  Left lung clear.   Abdominal: Soft. Bowel sounds are normal. There is no tenderness.  Musculoskeletal: Normal range of motion. He exhibits no edema.  Neurological: He is alert and oriented to person, place, and time. No cranial nerve deficit.  Skin: Skin is warm and dry.  Psychiatric: He has a normal mood and affect. His behavior is normal.    ED Course  Procedures (including critical care time)  DIAGNOSTIC STUDIES: Oxygen Saturation is 91% on room air, low by my interpretation.    COORDINATION OF CARE:  9:28 AM- Discussed treatment plan with patient, and the patient agreed to the plan.   Labs Review Labs Reviewed  BASIC METABOLIC PANEL - Abnormal; Notable for the following:    Glucose, Bld 195 (*)    GFR calc non Af Amer 67 (*)    GFR calc Af Amer 77 (*)    All other components within normal limits  D-DIMER, QUANTITATIVE - Abnormal; Notable for the following:    D-Dimer, Quant 2.23 (*)    All other components within normal limits  TROPONIN I  CBC WITH DIFFERENTIAL   Results for orders placed during the hospital encounter of 09/21/13  TROPONIN I      Result Value Range   Troponin I <0.30  <0.30 ng/mL  CBC WITH DIFFERENTIAL      Result Value Range   WBC 6.2  4.0 - 10.5 K/uL   RBC 4.74  4.22 - 5.81 MIL/uL   Hemoglobin 14.4  13.0 - 17.0 g/dL   HCT 16.1  09.6 - 04.5 %   MCV 92.0  78.0 - 100.0 fL   MCH 30.4  26.0 - 34.0 pg   MCHC 33.0  30.0 - 36.0 g/dL   RDW 40.9  81.1 - 91.4 %   Platelets 173  150 -  400 K/uL   Neutrophils Relative % 76  43 - 77 %   Neutro Abs 4.7  1.7 - 7.7 K/uL   Lymphocytes Relative 16  12 - 46 %   Lymphs Abs 1.0  0.7 - 4.0 K/uL   Monocytes Relative 6  3 - 12 %   Monocytes Absolute 0.3  0.1 - 1.0 K/uL   Eosinophils Relative 2  0 - 5 %  Eosinophils Absolute 0.1  0.0 - 0.7 K/uL   Basophils Relative 1  0 - 1 %   Basophils Absolute 0.0  0.0 - 0.1 K/uL  BASIC METABOLIC PANEL      Result Value Range   Sodium 140  135 - 145 mEq/L   Potassium 3.8  3.5 - 5.1 mEq/L   Chloride 102  96 - 112 mEq/L   CO2 29  19 - 32 mEq/L   Glucose, Bld 195 (*) 70 - 99 mg/dL   BUN 16  6 - 23 mg/dL   Creatinine, Ser 1.61  0.50 - 1.35 mg/dL   Calcium 9.9  8.4 - 09.6 mg/dL   GFR calc non Af Amer 67 (*) >90 mL/min   GFR calc Af Amer 77 (*) >90 mL/min  D-DIMER, QUANTITATIVE      Result Value Range   D-Dimer, Quant 2.23 (*) 0.00 - 0.48 ug/mL-FEU    Imaging Review Dg Chest 2 View  09/21/2013   CLINICAL DATA:  Shortness of breath.  EXAM: CHEST  2 VIEW  COMPARISON:  09/13/2013.  FINDINGS: There is slight enlargement of the cardiac silhouette. The patient has undergone previous median sternotomy and coronary artery bypass grafting. There is an overall hyperinflation COPD configuration. There are small bilateral pleural effusions with associated basilar atelectasis and infiltrative densities. There is central peribronchial thickening. There is osteopenic appearance of bones with changes of degenerative spondylosis. There is a chronic compression fracture of lower thoracic vertebral body unchanged. This is probably T12 vertebral body.  IMPRESSION: Slight cardiac silhouette enlargement. Post CABG. Overall hyperinflation COPD configuration. Small bilateral pleural effusions with associated basilar atelectasis and infiltrative densities. Central peribronchial thickening. Osteopenic appearance of bones. Degenerative spondylosis. Stable chronic compression fracture of lower thoracic body.   Electronically  Signed   By: Onalee Hua  Call M.D.   On: 09/21/2013 09:28   Ct Angio Chest Pe W/cm &/or Wo Cm  09/21/2013   CLINICAL DATA:  Shortness of breath.  EXAM: CT ANGIOGRAPHY CHEST WITH CONTRAST  TECHNIQUE: Multidetector CT imaging of the chest was performed using the standard protocol during bolus administration of intravenous contrast. Multiplanar CT image reconstructions including MIPs were obtained to evaluate the vascular anatomy.  CONTRAST:  OMNIPAQUE IOHEXOL 350 MG/ML SOLN  COMPARISON:  09/21/2013 chest x-ray  FINDINGS: No filling defects in the pulmonary arteries to suggest pulmonary emboli. Prior CABG. Mild cardiomegaly. Aorta is normal caliber. Mildly enlarged mediastinal lymph nodes. Left AP window lymph node has a short axis diameter of 15 mm on image 35. Right paratracheal lymph node has a short axis diameter of 11 mm on image 23. No axillary or hilar adenopathy.  Moderate bilateral pleural effusions. Areas of bibasilar compressive atelectasis. Ground-glass opacities noted dependently in the lungs could reflect edema or atelectasis. There is a small nodule in the superior segment of the left lower lobe measuring 5 mm which warrants followup. Mild COPD changes within the lungs.  Chest wall soft tissues are unremarkable. Imaging into the upper abdomen shows no acute findings. Calcifications in the spleen compatible with old granulomatous disease.  Review of the MIP images confirms the above findings.  IMPRESSION: No evidence of pulmonary embolus.  Moderate bilateral pleural effusions with compressive atelectasis in the lower lobes. Cardiomegaly. Ground-glass opacities dependently could reflect atelectasis or edema.  Mildly prominent mediastinal lymph nodes. These are likely reactive or related to congestion.  5 mm nodule in the superior segment of the left lower lobe. If the patient is at high risk  for bronchogenic carcinoma, follow-up chest CT at 6-12 months is recommended. If the patient is at low risk for  bronchogenic carcinoma, follow-up chest CT at 12 months is recommended. This recommendation follows the consensus statement: Guidelines for Management of Small Pulmonary Nodules Detected on CT Scans: A Statement from the Fleischner Society as published in Radiology 2005;237:395-400.   Electronically Signed   By: Charlett Nose M.D.   On: 09/21/2013 12:16    EKG Interpretation     Ventricular Rate:  77 PR Interval:  204 QRS Duration: 126 QT Interval:  422 QTC Calculation: 477 R Axis:   -60 Text Interpretation:  Normal sinus rhythm with sinus arrhythmia Left axis deviation Left ventricular hypertrophy with QRS widening and repolarization abnormality Abnormal ECG When compared with ECG of 13-Sep-2013 10:31, No significant change was found            MDM   1. COPD (chronic obstructive pulmonary disease)   2. Pleural effusion   3. Pulmonary nodule    Patient presented today with complaint of shortness of breath. Recently treated for pneumonia patient felt that he did not get better. Today's chest x-ray does not show any evidence of persistent or worsening pneumonia. Does show some bilateral pleural effusions. Also shows changes consistent with COPD. Also shows a pulmonary nodule that will require followup. Patient was wheezing mildly upon presentation patient was given albuterol and Atrovent nebulizer patient was then able to walk around the emergency department with sats remaining above 90% no severe shortness of breath overall patient was improved.  Patient's d-dimer was elevated so he had CT angios the chest showed no evidence of pulmonary embolism. Do show some findings mentioned above. Patient's basic chemistries without any significant abnormalities no leukocytosis. Patient has albuterol inhaler at home but was not using it. Patient appears to have COPD probably undiagnosed and on workup. Will discharge home with albuterol inhaler close followup with primary care Dr. due to the pleural  effusions in followup for the pulmonary nodule with CT scans in the future by his primary care Dr. Patient's cardiac workup was negative EKG without any acute findings troponin was negative.  I personally performed the services described in this documentation, which was scribed in my presence. The recorded information has been reviewed and is accurate.      Shelda Jakes, MD 09/21/13 [phone]

## 2013-09-21 NOTE — ED Notes (Signed)
Pt reports mild relief from breathing tx. MD notified.

## 2013-09-25 DIAGNOSIS — J189 Pneumonia, unspecified organism: Secondary | ICD-10-CM

## 2013-09-25 HISTORY — DX: Pneumonia, unspecified organism: J18.9

## 2013-09-26 ENCOUNTER — Encounter (HOSPITAL_COMMUNITY): Payer: Self-pay | Admitting: Emergency Medicine

## 2013-09-26 ENCOUNTER — Inpatient Hospital Stay (HOSPITAL_COMMUNITY)
Admission: EM | Admit: 2013-09-26 | Discharge: 2013-10-01 | DRG: 286 | Disposition: A | Discharge status: Home / Self Care | Payer: Medicare Other | Attending: Internal Medicine | Admitting: Internal Medicine

## 2013-09-26 ENCOUNTER — Emergency Department (HOSPITAL_COMMUNITY): Payer: Medicare Other

## 2013-09-26 DIAGNOSIS — Z720 Tobacco use: Secondary | ICD-10-CM

## 2013-09-26 DIAGNOSIS — E785 Hyperlipidemia, unspecified: Secondary | ICD-10-CM | POA: Diagnosis present

## 2013-09-26 DIAGNOSIS — T502X5A Adverse effect of carbonic-anhydrase inhibitors, benzothiadiazides and other diuretics, initial encounter: Secondary | ICD-10-CM | POA: Diagnosis not present

## 2013-09-26 DIAGNOSIS — I639 Cerebral infarction, unspecified: Secondary | ICD-10-CM | POA: Diagnosis present

## 2013-09-26 DIAGNOSIS — F17200 Nicotine dependence, unspecified, uncomplicated: Secondary | ICD-10-CM | POA: Diagnosis present

## 2013-09-26 DIAGNOSIS — J189 Pneumonia, unspecified organism: Secondary | ICD-10-CM | POA: Diagnosis present

## 2013-09-26 DIAGNOSIS — Z951 Presence of aortocoronary bypass graft: Secondary | ICD-10-CM

## 2013-09-26 DIAGNOSIS — I5023 Acute on chronic systolic (congestive) heart failure: Principal | ICD-10-CM | POA: Diagnosis present

## 2013-09-26 DIAGNOSIS — Z8673 Personal history of transient ischemic attack (TIA), and cerebral infarction without residual deficits: Secondary | ICD-10-CM

## 2013-09-26 DIAGNOSIS — I2589 Other forms of chronic ischemic heart disease: Secondary | ICD-10-CM | POA: Diagnosis present

## 2013-09-26 DIAGNOSIS — I6350 Cerebral infarction due to unspecified occlusion or stenosis of unspecified cerebral artery: Secondary | ICD-10-CM

## 2013-09-26 DIAGNOSIS — I5042 Chronic combined systolic (congestive) and diastolic (congestive) heart failure: Secondary | ICD-10-CM | POA: Diagnosis present

## 2013-09-26 DIAGNOSIS — I2510 Atherosclerotic heart disease of native coronary artery without angina pectoris: Secondary | ICD-10-CM | POA: Diagnosis present

## 2013-09-26 DIAGNOSIS — I10 Essential (primary) hypertension: Secondary | ICD-10-CM | POA: Diagnosis present

## 2013-09-26 DIAGNOSIS — N179 Acute kidney failure, unspecified: Secondary | ICD-10-CM | POA: Diagnosis not present

## 2013-09-26 DIAGNOSIS — I509 Heart failure, unspecified: Secondary | ICD-10-CM

## 2013-09-26 DIAGNOSIS — I739 Peripheral vascular disease, unspecified: Secondary | ICD-10-CM | POA: Diagnosis present

## 2013-09-26 HISTORY — DX: Peripheral vascular disease, unspecified: I73.9

## 2013-09-26 LAB — BASIC METABOLIC PANEL
CO2: 26 mEq/L (ref 19–32)
Chloride: 104 mEq/L (ref 96–112)
Creatinine, Ser: 1.1 mg/dL (ref 0.50–1.35)
Glucose, Bld: 125 mg/dL — ABNORMAL HIGH (ref 70–99)
Potassium: 4.5 mEq/L (ref 3.5–5.1)
Sodium: 142 mEq/L (ref 135–145)

## 2013-09-26 LAB — CBC WITH DIFFERENTIAL/PLATELET
Basophils Absolute: 0 10*3/uL (ref 0.0–0.1)
HCT: 46.9 % (ref 39.0–52.0)
Hemoglobin: 15 g/dL (ref 13.0–17.0)
Lymphocytes Relative: 21 % (ref 12–46)
Lymphs Abs: 1.5 10*3/uL (ref 0.7–4.0)
Monocytes Absolute: 0.5 10*3/uL (ref 0.1–1.0)
Monocytes Relative: 7 % (ref 3–12)
Neutro Abs: 5.2 10*3/uL (ref 1.7–7.7)
Neutrophils Relative %: 70 % (ref 43–77)
RDW: 14.6 % (ref 11.5–15.5)
WBC: 7.4 10*3/uL (ref 4.0–10.5)

## 2013-09-26 LAB — PRO B NATRIURETIC PEPTIDE: Pro B Natriuretic peptide (BNP): 12096 pg/mL — ABNORMAL HIGH (ref 0–450)

## 2013-09-26 LAB — TSH: TSH: 3.915 u[IU]/mL (ref 0.350–4.500)

## 2013-09-26 LAB — TROPONIN I
Troponin I: <0.3 ng/mL (ref <0.30)
Troponin I: <0.3 ng/mL (ref <0.30)

## 2013-09-26 MED ORDER — ALBUTEROL SULFATE (5 MG/ML) 0.5% IN NEBU
5.0000 mg | INHALATION_SOLUTION | Freq: Once | RESPIRATORY_TRACT | Status: AC
  Administered 2013-09-26: 5 mg via RESPIRATORY_TRACT
  Filled 2013-09-26: qty 1

## 2013-09-26 MED ORDER — ONDANSETRON HCL 4 MG/2ML IJ SOLN: 4.0000 mg | Freq: Four times a day (QID) | INTRAMUSCULAR | Status: DC | PRN

## 2013-09-26 MED ORDER — METHYLPREDNISOLONE SODIUM SUCC 125 MG IJ SOLR
125.0000 mg | Freq: Once | INTRAMUSCULAR | Status: AC
  Administered 2013-09-26: 125 mg via INTRAVENOUS
  Filled 2013-09-26: qty 2

## 2013-09-26 MED ORDER — LEVOFLOXACIN IN D5W 250 MG/50ML IV SOLN
250.0000 mg | Freq: Once | INTRAVENOUS | Status: AC
  Administered 2013-09-26: 250 mg via INTRAVENOUS
  Filled 2013-09-26: qty 50

## 2013-09-26 MED ORDER — SODIUM CHLORIDE 0.9 % IV BOLUS (SEPSIS)
500.0000 mL | Freq: Once | INTRAVENOUS | Status: AC
  Administered 2013-09-26: 500 mL via INTRAVENOUS

## 2013-09-26 MED ORDER — FUROSEMIDE 10 MG/ML IJ SOLN
40.0000 mg | Freq: Every day | INTRAMUSCULAR | Status: DC
  Administered 2013-09-27: 40 mg via INTRAVENOUS
  Filled 2013-09-26: qty 4

## 2013-09-26 MED ORDER — BIOTENE DRY MOUTH MT LIQD: 15.0000 mL | OROMUCOSAL | Status: DC | PRN

## 2013-09-26 MED ORDER — ASPIRIN EC 81 MG PO TBEC
81.0000 mg | DELAYED_RELEASE_TABLET | Freq: Every day | ORAL | Status: DC
  Administered 2013-09-26 – 2013-10-01 (×5): 81 mg via ORAL
  Filled 2013-09-26 (×5): qty 1

## 2013-09-26 MED ORDER — IPRATROPIUM BROMIDE 0.02 % IN SOLN
0.5000 mg | Freq: Once | RESPIRATORY_TRACT | Status: AC
  Administered 2013-09-26: 0.5 mg via RESPIRATORY_TRACT
  Filled 2013-09-26: qty 2.5

## 2013-09-26 MED ORDER — MORPHINE SULFATE 2 MG/ML IJ SOLN: 2.0000 mg | INTRAMUSCULAR | Status: DC | PRN

## 2013-09-26 MED ORDER — METOPROLOL TARTRATE 50 MG PO TABS
50.0000 mg | ORAL_TABLET | Freq: Two times a day (BID) | ORAL | Status: DC
  Administered 2013-09-26 – 2013-09-27 (×4): 50 mg via ORAL
  Filled 2013-09-26 (×4): qty 1

## 2013-09-26 MED ORDER — LEVOFLOXACIN IN D5W 750 MG/150ML IV SOLN
750.0000 mg | INTRAVENOUS | Status: DC
  Filled 2013-09-26: qty 150

## 2013-09-26 MED ORDER — ACETAMINOPHEN 650 MG RE SUPP: 650.0000 mg | Freq: Four times a day (QID) | RECTAL | Status: DC | PRN

## 2013-09-26 MED ORDER — CHLORHEXIDINE GLUCONATE 0.12 % MT SOLN
15.0000 mL | Freq: Two times a day (BID) | OROMUCOSAL | Status: DC
  Administered 2013-09-26 – 2013-09-27 (×3): 15 mL via OROMUCOSAL
  Filled 2013-09-26 (×3): qty 15

## 2013-09-26 MED ORDER — ACETAMINOPHEN 325 MG PO TABS: 650.0000 mg | ORAL_TABLET | Freq: Four times a day (QID) | ORAL | Status: DC | PRN

## 2013-09-26 MED ORDER — IPRATROPIUM BROMIDE 0.02 % IN SOLN
0.5000 mg | RESPIRATORY_TRACT | Status: DC | PRN
  Administered 2013-09-26: 0.5 mg via RESPIRATORY_TRACT
  Filled 2013-09-26: qty 2.5

## 2013-09-26 MED ORDER — ONDANSETRON HCL 4 MG PO TABS: 4.0000 mg | ORAL_TABLET | Freq: Four times a day (QID) | ORAL | Status: DC | PRN

## 2013-09-26 MED ORDER — PREDNISONE 20 MG PO TABS
60.0000 mg | ORAL_TABLET | Freq: Every day | ORAL | Status: DC
  Administered 2013-09-27 – 2013-09-28 (×2): 60 mg via ORAL
  Filled 2013-09-26: qty 3

## 2013-09-26 MED ORDER — FUROSEMIDE 40 MG PO TABS
40.0000 mg | ORAL_TABLET | Freq: Once | ORAL | Status: AC
  Administered 2013-09-26: 40 mg via ORAL
  Filled 2013-09-26: qty 1

## 2013-09-26 MED ORDER — ALBUTEROL SULFATE (5 MG/ML) 0.5% IN NEBU
2.5000 mg | INHALATION_SOLUTION | RESPIRATORY_TRACT | Status: DC | PRN
  Administered 2013-09-26: 2.5 mg via RESPIRATORY_TRACT
  Filled 2013-09-26: qty 0.5

## 2013-09-26 MED ORDER — LEVOFLOXACIN IN D5W 500 MG/100ML IV SOLN
500.0000 mg | Freq: Once | INTRAVENOUS | Status: AC
  Administered 2013-09-26: 500 mg via INTRAVENOUS
  Filled 2013-09-26: qty 100

## 2013-09-26 MED ORDER — HEPARIN SODIUM (PORCINE) 5000 UNIT/ML IJ SOLN
5000.0000 [IU] | Freq: Three times a day (TID) | INTRAMUSCULAR | Status: DC
  Administered 2013-09-26 – 2013-09-28 (×6): 5000 [IU] via SUBCUTANEOUS
  Filled 2013-09-26 (×8): qty 1

## 2013-09-26 MED ORDER — LISINOPRIL 10 MG PO TABS
10.0000 mg | ORAL_TABLET | Freq: Every day | ORAL | Status: DC
  Administered 2013-09-26 – 2013-09-27 (×2): 10 mg via ORAL
  Filled 2013-09-26 (×2): qty 1

## 2013-09-26 NOTE — ED Notes (Signed)
Pt reports having increased sob over the last 3 days. +smoker, denies any cp, dry cough, no fever.  Describes  As "something on his chest"

## 2013-09-26 NOTE — Progress Notes (Signed)
Pt came up to floor from ED. In NAD will continue to monitor.  

## 2013-09-26 NOTE — ED Notes (Signed)
Report given to [facility] , RN unit 300. Ready to receive patient.

## 2013-09-26 NOTE — Progress Notes (Signed)
ANTIBIOTIC CONSULT NOTE - INITIAL  Pharmacy Consult for Levaquin Indication: pneumonia  No Known Allergies  Patient Measurements: Height: 5\' 8"  (172.7 cm) Weight: 188 lb (85.276 kg) IBW/kg (Calculated) : 68.4 Adjusted Body Weight:   Vital Signs: Temp: 99.1 F (37.3 C) (11/02 0719) Temp src: Rectal (11/02 0719) BP: 181/112 mmHg (11/02 1030) Pulse Rate: 94 (11/02 1030) Intake/Output from previous day:   Intake/Output from this shift:    Labs:  Recent Labs  09/26/13 0840  WBC 7.4  HGB 15.0  PLT 187  CREATININE 1.10   Estimated Creatinine Clearance: 58.9 ml/min (by C-G formula based on Cr of 1.1). No results found for this basename: VANCOTROUGH, VANCOPEAK, VANCORANDOM, GENTTROUGH, GENTPEAK, GENTRANDOM, TOBRATROUGH, TOBRAPEAK, TOBRARND, AMIKACINPEAK, AMIKACINTROU, AMIKACIN,  in the last 72 hours   Microbiology: No results found for this or any previous visit (from the past 720 hour(s)).  Medical History: Past Medical History  Diagnosis Date  . Stroke   . Coronary artery disease   . Hypertension   . Pneumonia     Medications:  Scheduled:  . aspirin EC  81 mg Oral Daily  . [START ON 09/27/2013] furosemide  40 mg Intravenous Daily  . heparin  5,000 Units Subcutaneous Q8H  . levofloxacin (LEVAQUIN) IV  250 mg Intravenous Once  . [START ON 09/27/2013] levofloxacin (LEVAQUIN) IV  750 mg Intravenous Q24H  . lisinopril  10 mg Oral Daily  . metoprolol tartrate  50 mg Oral BID  . [START ON 09/27/2013] predniSONE  60 mg Oral Q breakfast   Assessment: Empiric abx for CAP Levaquin 500 mg IV given in ED CrCl 58.9 ml/min  Goal of Therapy:  Eradicate infection  Plan:  Additional Levaquin 250 mg IV today, then 750 mg IV every 24 hours starting tomorrow Monitor renal function Labs per protocol   Raquel Diallo, Angle Karel Bennett 09/26/2013,11:58 AM

## 2013-09-26 NOTE — H&P (Signed)
[facility] History and Physical  Carnie Bruemmer WUJ:[id number] DOB: 1932-07-15 DOA: 09/26/2013  Referring physician: Emergency department PCP: [name], MD  Specialists:   Chief Complaint: SOB  HPI: Gary Odonnell is a 79 y.o. male  With a hx of htn, prior CVA, and CAD who presents to the ED with complaints of worsening SOB over the past several days. In the ED, the patient was found to have o2 sats in the low 90's on RA and a cxr with findings suggestive of pulm edema. A BNP was noted to be over 12,000 The patient was given one dose of IV lasix and started on empiric abx for CAP. The hospitalist service was consulted for admission.  Review of Systems:  Per above, the remainder of the 10pt ros reviewed and are neg  Past Medical History  Diagnosis Date  . Stroke   . Coronary artery disease   . Hypertension   . Pneumonia    Past Surgical History  Procedure Laterality Date  . Coronary artery bypass graft    . Knee arthroscopy    . Trigger finger release Bilateral    Social History:  reports that he has been smoking Cigars.  He does not have any smokeless tobacco history on file. He reports that he does not drink alcohol or use illicit drugs.  where does patient live--home, ALF, SNF? and with whom if at home?  Can patient participate in ADLs?  No Known Allergies  No family history on file.  (be sure to complete)  Prior to Admission medications   Medication Sig Start Date End Date Taking? Authorizing Provider  albuterol (PROVENTIL HFA;VENTOLIN HFA) 108 (90 BASE) MCG/ACT inhaler Inhale 1-2 puffs into the lungs every 6 (six) hours as needed for wheezing. 09/21/13  Yes Shelda Jakes, MD  aspirin 325 MG tablet Take 650 mg by mouth daily.   Yes Historical Provider, MD  guaiFENesin (MUCINEX) 600 MG 12 hr tablet Take 600 mg by mouth daily.   Yes Historical Provider, MD   Physical Exam: Filed Vitals:   09/26/13 0719 09/26/13 0800 09/26/13 0933 09/26/13 0945  BP:  160/95  173/99 179/91  Pulse: 94  90 91  Temp: 99.1 F (37.3 C)     TempSrc: Rectal     Resp: 26  16 21   Height: 5\' 8"  (1.727 m)     Weight: 85.276 kg (188 lb)     SpO2: 91% 95% 100% 98%     General:  Awake, in nad  Eyes: PERRL B  ENT: membranes moist, dentition fair  Neck: trachea midline, neck supple  Cardiovascular: regular, s1, s2, post-sternotomy scar  Respiratory: normal resp effort, no wheezing anteriorly  Abdomen: soft, nondistneded  Skin: normal skin turgor, no abnormal skin lesions seen  Musculoskeletal: perfused, no clubbing, no LE edema  Psychiatric: mood/affect normal // no auditory/visual hallucinations  Neurologic: cn2-12 grossly intact, strength/sensation intact  Labs on Admission:  Basic Metabolic Panel:  Recent Labs Lab 09/21/13 1004 09/26/13 0840  NA 140 142  K 3.8 4.5  CL 102 104  CO2 29 26  GLUCOSE 195* 125*  BUN 16 14  CREATININE 1.04 1.10  CALCIUM 9.9 10.7*   Liver Function Tests: No results found for this basename: AST, ALT, ALKPHOS, BILITOT, PROT, ALBUMIN,  in the last 168 hours No results found for this basename: LIPASE, AMYLASE,  in the last 168 hours No results found for this basename: AMMONIA,  in the last 168 hours CBC:  Recent Labs Lab 09/21/13  1004 09/26/13 0840  WBC 6.2 7.4  NEUTROABS 4.7 5.2  HGB 14.4 15.0  HCT 43.6 46.9  MCV 92.0 94.4  PLT 173 187   Cardiac Enzymes:  Recent Labs Lab 09/21/13 1004 09/26/13 0840  TROPONINI <0.30 <0.30    BNP (last 3 results)  Recent Labs  09/26/13 0840  PROBNP 12096.0*   CBG: No results found for this basename: GLUCAP,  in the last 168 hours  Radiological Exams on Admission: Dg Chest 2 View  09/26/2013   CLINICAL DATA:  Shortness of breath.  EXAM: CHEST  2 VIEW  COMPARISON:  09/21/2013  FINDINGS: Two views of the chest were obtained. Again noted are bilateral pleural effusions, left side greater than right. Heart size is grossly stable. Slightly increased right  basilar densities may represent increased atelectasis or mild dependent edema. Old compression fracture in the lower thoracic spine.  IMPRESSION: Persistent bilateral pleural effusions. Slightly increased right basilar densities could represent atelectasis or mild edema. Minimal change from the recent comparison examination.   Electronically Signed   By: Richarda Overlie M.D.   On: 09/26/2013 08:26    Assessment/Plan Principal Problem:   CHF (congestive heart failure) Active Problems:   HTN (hypertension)   CVA (cerebral infarction)   1. Suspected CHF exacerbation 1. Would continue lasix as tolerated 2. Follow daily weights and i/o closely 3. Follow renal panel and lytes, correct as needed 4. Will check 2D echo. Last was done several years ago, demonstrating EF 50-55% 2. Possible acute bronchitis 1. Given empiric abx in the ED 2. Cont on steroids and bronchodilators 3. O2 as needed 3. HTN 1. Poorly controlled currently 2. No BP meds per home med list - pt elected to stop them, unknown what he had been taking before 3. BP in the stage 2 range so will start on 2 bp meds 4. Given concerns for CHF, will start on ACEI 4. Hx CVA 1. Stable 5. DVT prophylaxis 1. Heparin subQ  Code Status: Ful (must indicate code status--if unknown or must be presumed, indicate so) Family Communication: Pt in room (indicate person spoken with, if applicable, with phone number if by telephone) Disposition Plan: Pending (indicate anticipated LOS)  Time spent:  Shaylee Stanislawski K [facility] Pager [phone]  If 7PM-7AM, please contact night-coverage [email or web address] Password [facility] 09/26/2013, 10:34 AM

## 2013-09-26 NOTE — ED Notes (Signed)
Attempted to call report. No answer.

## 2013-09-26 NOTE — ED Provider Notes (Signed)
CSN: [id number]     Arrival date & time 09/26/13  0707 History  This chart was scribed for Gary Quarry, MD by Valera Castle, ED Scribe. This patient was seen in room APA06/APA06 and the patient's care was started at 7:17 AM.    Chief Complaint  Patient presents with  . Shortness of Breath   Patient is a 79 y.o. male presenting with shortness of breath. The history is provided by the patient. No language interpreter was used.  Shortness of Breath Severity:  Moderate Onset quality:  Sudden Duration:  3 days Timing:  Constant Progression:  Worsening Chronicity:  New Relieved by:  Nothing Worsened by:  Deep breathing Associated symptoms: no chest pain, no cough, no fever and no sore throat    HPI Comments: Arnold Kester is a 79 y.o. male who presents to the Emergency Department complaining of sudden, moderate, constant SOB, onset 3 weeks ago, but that it has gotten worse the past 3 days. He reports associated drainage from his sinuses. He reports being here 2 weeks ago and diagnosed with pneumonia, being put on Z-Pak, and reports taking them regularly, without improvement. He states the xray showed that he had fluid in his lungs. He denies being on O2 at home. He denies currently being on any blood thinners. He reports using an inhaler and a vaporizer at home for his symptoms, with little relief. He denies being on any steroids for his symptoms. He denies having much of an appetite. He denies chest pain, fever, dry coughing, cold symptoms, sore throat, leg swelling, and any other associated symptoms. He reports h/o high blood pressure, bypass, MI, and stroke.   PCP - [name], MD  Past Medical History  Diagnosis Date  . Stroke   . Coronary artery disease   . Hypertension   . Pneumonia    Past Surgical History  Procedure Laterality Date  . Coronary artery bypass graft    . Knee arthroscopy    . Trigger finger release Bilateral    No family history on file. History   Substance Use Topics  . Smoking status: Current Every Day Smoker -- 1.00 packs/day    Types: Cigars  . Smokeless tobacco: Not on file  . Alcohol Use: No    Review of Systems  Constitutional: Negative for fever.  HENT: Negative for sore throat.   Respiratory: Positive for shortness of breath. Negative for cough.   Cardiovascular: Negative for chest pain.  All other systems reviewed and are negative.    Allergies  Review of patient's allergies indicates no known allergies.  Home Medications   Current Outpatient Rx  Name  Route  Sig  Dispense  Refill  . albuterol (PROVENTIL HFA;VENTOLIN HFA) 108 (90 BASE) MCG/ACT inhaler   Inhalation   Inhale 1-2 puffs into the lungs every 6 (six) hours as needed for wheezing.   1 Inhaler   0   . albuterol (PROVENTIL HFA;VENTOLIN HFA) 108 (90 BASE) MCG/ACT inhaler   Inhalation   Inhale 1-2 puffs into the lungs every 6 (six) hours as needed for wheezing.   1 Inhaler   0   . aspirin 325 MG tablet   Oral   Take 650 mg by mouth daily.         Marland Kitchen azithromycin (ZITHROMAX Z-PAK) 250 MG tablet      2 po day one, then 1 daily x 4 days   5 tablet   0   . guaiFENesin (MUCINEX) 600 MG 12 hr  tablet   Oral   Take 600 mg by mouth daily.          BP 160/95  Pulse 94  Temp(Src) 99.1 F (37.3 C) (Rectal)  Resp 26  Ht 5\' 8"  (1.727 m)  Wt 188 lb (85.276 kg)  BMI 28.59 kg/m2  SpO2 91%  Physical Exam  Nursing note and vitals reviewed. Constitutional: He is oriented to person, place, and time. He appears well-developed and well-nourished. No distress.  HENT:  Head: Normocephalic and atraumatic.  Right Ear: External ear normal.  Left Ear: External ear normal.  Nose: Nose normal.  Mouth/Throat: Oropharynx is clear and moist.  Eyes: EOM are normal. Pupils are equal, round, and reactive to light.  Neck: Neck supple. No tracheal deviation present. No thyromegaly present.  Cardiovascular: Normal rate, regular rhythm and normal heart  sounds.   Pulmonary/Chest: Effort normal. He has wheezes.  Decreased breath sounds. Expiratory wheezing. Coughing on exam.   Neurological: He is alert and oriented to person, place, and time.  Skin: Skin is warm and dry. He is not diaphoretic.  Psychiatric: He has a normal mood and affect. His behavior is normal.    ED Course  Procedures (including critical care time)  DIAGNOSTIC STUDIES: Oxygen Saturation is 91% on room air, adequate by my interpretation.    COORDINATION OF CARE: 7:24 AM-Discussed treatment plan which includes CBC panel, BMP, CXR, EKG, troponin with pt at bedside and pt agreed to plan.    Labs Review Labs Reviewed  CBC WITH DIFFERENTIAL  BASIC METABOLIC PANEL  TROPONIN I  PRO B NATRIURETIC PEPTIDE   Imaging Review Dg Chest 2 View  09/26/2013   CLINICAL DATA:  Shortness of breath.  EXAM: CHEST  2 VIEW  COMPARISON:  09/21/2013  FINDINGS: Two views of the chest were obtained. Again noted are bilateral pleural effusions, left side greater than right. Heart size is grossly stable. Slightly increased right basilar densities may represent increased atelectasis or mild dependent edema. Old compression fracture in the lower thoracic spine.  IMPRESSION: Persistent bilateral pleural effusions. Slightly increased right basilar densities could represent atelectasis or mild edema. Minimal change from the recent comparison examination.   Electronically Signed   By: Richarda Overlie M.D.   On: 09/26/2013 08:26    EKG Interpretation     Ventricular Rate:  87 PR Interval:  222 QRS Duration: 128 QT Interval:  408 QTC Calculation: 490 R Axis:   -53 Text Interpretation:  Sinus rhythm with 1st degree A-V block Left axis deviation Left ventricular hypertrophy with QRS widening and repolarization abnormality Abnormal ECG When compared with ECG of 21-Sep-2013 09:50, T wave inversion more evident in Lateral leads           Meds ordered this encounter  Medications  . sodium chloride  0.9 % bolus 500 mL    Sig:   . methylPREDNISolone sodium succinate (SOLU-MEDROL) 125 mg/2 mL injection 125 mg    Sig:   . albuterol (PROVENTIL) (5 MG/ML) 0.5% nebulizer solution 5 mg    Sig:   . ipratropium (ATROVENT) nebulizer solution 0.5 mg    Sig:      MDM  No diagnosis found.  Results for orders placed during the hospital encounter of 09/26/13  CBC WITH DIFFERENTIAL      Result Value Range   WBC 7.4  4.0 - 10.5 K/uL   RBC 4.97  4.22 - 5.81 MIL/uL   Hemoglobin 15.0  13.0 - 17.0 g/dL   HCT 46.9  39.0 - 52.0 %   MCV 94.4  78.0 - 100.0 fL   MCH 30.2  26.0 - 34.0 pg   MCHC 32.0  30.0 - 36.0 g/dL   RDW 19.1  47.8 - 29.5 %   Platelets 187  150 - 400 K/uL   Neutrophils Relative % 70  43 - 77 %   Neutro Abs 5.2  1.7 - 7.7 K/uL   Lymphocytes Relative 21  12 - 46 %   Lymphs Abs 1.5  0.7 - 4.0 K/uL   Monocytes Relative 7  3 - 12 %   Monocytes Absolute 0.5  0.1 - 1.0 K/uL   Eosinophils Relative 2  0 - 5 %   Eosinophils Absolute 0.1  0.0 - 0.7 K/uL   Basophils Relative 0  0 - 1 %   Basophils Absolute 0.0  0.0 - 0.1 K/uL  BASIC METABOLIC PANEL      Result Value Range   Sodium 142  135 - 145 mEq/L   Potassium 4.5  3.5 - 5.1 mEq/L   Chloride 104  96 - 112 mEq/L   CO2 26  19 - 32 mEq/L   Glucose, Bld 125 (*) 70 - 99 mg/dL   BUN 14  6 - 23 mg/dL   Creatinine, Ser 6.21  0.50 - 1.35 mg/dL   Calcium 30.8 (*) 8.4 - 10.5 mg/dL   GFR calc non Af Amer 62 (*) >90 mL/min   GFR calc Af Amer 72 (*) >90 mL/min  TROPONIN I      Result Value Range   Troponin I <0.30  <0.30 ng/mL  PRO B NATRIURETIC PEPTIDE      Result Value Range   Pro B Natriuretic peptide (BNP) 12096.0 (*) 0 - 450 pg/mL   Dg Chest 2 View  09/26/2013   CLINICAL DATA:  Shortness of breath.  EXAM: CHEST  2 VIEW  COMPARISON:  09/21/2013  FINDINGS: Two views of the chest were obtained. Again noted are bilateral pleural effusions, left side greater than right. Heart size is grossly stable. Slightly increased right basilar  densities may represent increased atelectasis or mild dependent edema. Old compression fracture in the lower thoracic spine.  IMPRESSION: Persistent bilateral pleural effusions. Slightly increased right basilar densities could represent atelectasis or mild edema. Minimal change from the recent comparison examination.   Electronically Signed   By: Richarda Overlie M.D.   On: 09/26/2013 08:26   Dg Chest 2 View   79 y.o. smoker presents today with to 3 weeks of ongoing dyspnea and expiratory wheezes. He was treated for pneumonia here with Zithromax but continues to feel dyspneic. He denies chest pain or peripheral edema. He does have diffuse expiratory wheezes and rhonchi on exam. Chest X. Jeriah Corkum reveals increasing bilateral pleural effusions. Patient also has an elevated BNP at greater than 12,000. He is given 40 mg of Lasix here. He is also covered for pneumonia with Levaquin. Patient's sats have been approximately 90% on room air and he is not on home oxygen. Given patient's multiple is indications for dyspnea a will advise hospitalization at this time with treatment for both COPD and failure.  I personally performed the services described in this documentation, which was scribed in my presence. The recorded information has been reviewed and considered.   Gary Quarry, MD 09/26/13 1037

## 2013-09-27 DIAGNOSIS — J189 Pneumonia, unspecified organism: Secondary | ICD-10-CM

## 2013-09-27 DIAGNOSIS — N179 Acute kidney failure, unspecified: Secondary | ICD-10-CM | POA: Diagnosis present

## 2013-09-27 DIAGNOSIS — I059 Rheumatic mitral valve disease, unspecified: Secondary | ICD-10-CM

## 2013-09-27 LAB — COMPREHENSIVE METABOLIC PANEL
ALT: 19 U/L (ref 0–53)
AST: 24 U/L (ref 0–37)
Alkaline Phosphatase: 82 U/L (ref 39–117)
CO2: 27 mEq/L (ref 19–32)
Calcium: 10.1 mg/dL (ref 8.4–10.5)
Chloride: 102 mEq/L (ref 96–112)
GFR calc Af Amer: 53 mL/min — ABNORMAL LOW (ref >90)
GFR calc non Af Amer: 45 mL/min — ABNORMAL LOW (ref >90)
Glucose, Bld: 171 mg/dL — ABNORMAL HIGH (ref 70–99)
Potassium: 3.9 mEq/L (ref 3.5–5.1)
Sodium: 140 mEq/L (ref 135–145)
Total Bilirubin: 1.2 mg/dL (ref 0.3–1.2)
Total Protein: 6.7 g/dL (ref 6.0–8.3)

## 2013-09-27 LAB — CBC
HCT: 44.1 % (ref 39.0–52.0)
MCH: 30 pg (ref 26.0–34.0)
MCHC: 32.2 g/dL (ref 30.0–36.0)
Platelets: 182 10*3/uL (ref 150–400)
RDW: 14.6 % (ref 11.5–15.5)
WBC: 9.2 10*3/uL (ref 4.0–10.5)

## 2013-09-27 LAB — TROPONIN I: Troponin I: <0.3 ng/mL (ref <0.30)

## 2013-09-27 MED ORDER — FUROSEMIDE 20 MG PO TABS
20.0000 mg | ORAL_TABLET | Freq: Every day | ORAL | Status: DC
  Administered 2013-09-28: 20 mg via ORAL
  Filled 2013-09-27: qty 1

## 2013-09-27 MED ORDER — LEVOFLOXACIN IN D5W 750 MG/150ML IV SOLN
750.0000 mg | INTRAVENOUS | Status: DC
  Administered 2013-09-27: 750 mg via INTRAVENOUS
  Filled 2013-09-27: qty 150

## 2013-09-27 MED ORDER — ALBUTEROL SULFATE (5 MG/ML) 0.5% IN NEBU
2.5000 mg | INHALATION_SOLUTION | RESPIRATORY_TRACT | Status: DC
  Administered 2013-09-27 – 2013-10-01 (×17): 2.5 mg via RESPIRATORY_TRACT
  Filled 2013-09-27 (×19): qty 0.5

## 2013-09-27 MED ORDER — IPRATROPIUM BROMIDE 0.02 % IN SOLN
0.5000 mg | RESPIRATORY_TRACT | Status: DC
  Administered 2013-09-27 – 2013-10-01 (×17): 0.5 mg via RESPIRATORY_TRACT
  Filled 2013-09-27 (×19): qty 2.5

## 2013-09-27 NOTE — Progress Notes (Signed)
[facility] PROGRESS NOTE  Gary Odonnell ZOX:[id number] DOB: 1932-05-14 DOA: 09/26/2013 PCP: [name], MD  Assessment/Plan: 1. Suspected CHF exacerbation  1.  volume status -60. Weight 86.3kg up from 85.2kg yesterday. con ntinue lasix cautiously as creatinine trending upward. Await echo results 2. Last was done several years ago, demonstrating EF 50-55% 2. Possible acute bronchitis  1. Empiric Levaqin day #2.  2. Cont on steroids and bronchodilators 3. O2 as needed; will wean oxygen  3. HTN  1. Improved control 2. No BP meds per home med list - pt elected to stop them, unknown what he had been taking before 3. BP in the stage 2 range so started on Lisinopril and lopressor   4. Hx CVA  1. Stable 5. DVT prophylaxis  1. Heparin subQ  Acute renal failure: related to lasix. Will discontinue IV lasix and continue with lasix po. monitor    Code Status: full Family Communication: wife at bedside position Plan: home when ready hopefully 24-36 hours   Consultants:  none  Procedures:  none  Antibiotics:  Levaquin 09/26/13>>>  HPI/Subjective: Sitting up in bed. Denies pain /discomfort/sob.   Objective: Filed Vitals:   09/27/13 0427  BP: 132/81  Pulse: 64  Temp: 97.4 F (36.3 C)  Resp: 20    Intake/Output Summary (Last 24 hours) at 09/27/13 1102 Last data filed at 09/27/13 0923  Gross per 24 hour  Intake    480 ml  Output    300 ml  Net    180 ml   Filed Weights   09/26/13 0719 09/27/13 0427  Weight: 85.276 kg (188 lb) 86.3 kg (190 lb 4.1 oz)    Exam:   General:  Well nourished NAD  Cardiovascular: RRR No MGR No LE edema  Respiratory: normal effort with conversation. Diminished BS throughout. Fine crackles bilateral bases particularly on left. Faint expiratory wheeze upper lobes  Abdomen: soft +BS non-tender to palpation no mass/organomegaly  Musculoskeletal: good muscle tone. No clubbing no cyanosis   Data Reviewed: Basic  Metabolic Panel:  Recent Labs Lab 09/21/13 1004 09/26/13 0840 09/27/13 0238  NA 140 142 140  K 3.8 4.5 3.9  CL 102 104 102  CO2 29 26 27   GLUCOSE 195* 125* 171*  BUN 16 14 23   CREATININE 1.04 1.10 1.43*  CALCIUM 9.9 10.7* 10.1   Liver Function Tests:  Recent Labs Lab 09/27/13 0238  AST 24  ALT 19  ALKPHOS 82  BILITOT 1.2  PROT 6.7  ALBUMIN 3.3*   No results found for this basename: LIPASE, AMYLASE,  in the last 168 hours No results found for this basename: AMMONIA,  in the last 168 hours CBC:  Recent Labs Lab 09/21/13 1004 09/26/13 0840 09/27/13 0238  WBC 6.2 7.4 9.2  NEUTROABS 4.7 5.2  --   HGB 14.4 15.0 14.2  HCT 43.6 46.9 44.1  MCV 92.0 94.4 93.2  PLT 173 187 182   Cardiac Enzymes:  Recent Labs Lab 09/21/13 1004 09/26/13 0840 09/26/13 1425 09/26/13 2039 09/27/13 0238  TROPONINI <0.30 <0.30 <0.30 <0.30 <0.30   BNP (last 3 results)  Recent Labs  09/26/13 0840  PROBNP 12096.0*   CBG: No results found for this basename: GLUCAP,  in the last 168 hours  No results found for this or any previous visit (from the past 240 hour(s)).   Studies: Dg Chest 2 View  09/26/2013   CLINICAL DATA:  Shortness of breath.  EXAM: CHEST  2 VIEW  COMPARISON:  09/21/2013  FINDINGS:  Two views of the chest were obtained. Again noted are bilateral pleural effusions, left side greater than right. Heart size is grossly stable. Slightly increased right basilar densities may represent increased atelectasis or mild dependent edema. Old compression fracture in the lower thoracic spine.  IMPRESSION: Persistent bilateral pleural effusions. Slightly increased right basilar densities could represent atelectasis or mild edema. Minimal change from the recent comparison examination.   Electronically Signed   By: Richarda Overlie M.D.   On: 09/26/2013 08:26    Scheduled Meds: . ipratropium  0.5 mg Nebulization Q4H WA   And  . albuterol  2.5 mg Nebulization Q4H WA  . aspirin EC  81 mg  Oral Daily  . chlorhexidine  15 mL Mouth/Throat BID  . furosemide  40 mg Intravenous Daily  . heparin  5,000 Units Subcutaneous Q8H  . levofloxacin (LEVAQUIN) IV  750 mg Intravenous Q48H  . lisinopril  10 mg Oral Daily  . metoprolol tartrate  50 mg Oral BID  . predniSONE  60 mg Oral Q breakfast   Continuous Infusions:   Principal Problem:   CHF (congestive heart failure) Active Problems:   HTN (hypertension)   CVA (cerebral infarction)   CAP (community acquired pneumonia)   CAD (coronary atherosclerotic disease)   S/P CABG x 4   Acute renal failure    Time spent: 30 minutes    [facility] M  [facility] Pager [phone]. If 7PM-7AM, please contact night-coverage at [email or web address], password [facility] 09/27/2013, 11:02 AM  LOS: 1 day

## 2013-09-27 NOTE — Progress Notes (Signed)
ANTIBIOTIC CONSULT NOTE - follow up  Pharmacy Consult for Levaquin Indication: pneumonia  No Known Allergies  Patient Measurements: Height: 5\' 8"  (172.7 cm) Weight: 190 lb 4.1 oz (86.3 kg) IBW/kg (Calculated) : 68.4  Vital Signs: Temp: 97.4 F (36.3 C) (11/03 0427) Temp src: Oral (11/03 0427) BP: 132/81 mmHg (11/03 0427) Pulse Rate: 64 (11/03 0427) Intake/Output from previous day: 11/02 0701 - 11/03 0700 In: 240 [P.O.:240] Out: 300 [Urine:300] Intake/Output from this shift:    Labs:  Recent Labs  09/26/13 0840 09/27/13 0238  WBC 7.4 9.2  HGB 15.0 14.2  PLT 187 182  CREATININE 1.10 1.43*   Estimated Creatinine Clearance: 45.5 ml/min (by C-G formula based on Cr of 1.43). No results found for this basename: VANCOTROUGH, VANCOPEAK, VANCORANDOM, GENTTROUGH, GENTPEAK, GENTRANDOM, TOBRATROUGH, TOBRAPEAK, TOBRARND, AMIKACINPEAK, AMIKACINTROU, AMIKACIN,  in the last 72 hours   Microbiology: No results found for this or any previous visit (from the past 720 hour(s)).  Medical History: Past Medical History  Diagnosis Date  . Stroke   . Coronary artery disease   . Hypertension   . Pneumonia    Medications:  Scheduled:  . ipratropium  0.5 mg Nebulization Q4H WA   And  . albuterol  2.5 mg Nebulization Q4H WA  . aspirin EC  81 mg Oral Daily  . chlorhexidine  15 mL Mouth/Throat BID  . furosemide  40 mg Intravenous Daily  . heparin  5,000 Units Subcutaneous Q8H  . levofloxacin (LEVAQUIN) IV  750 mg Intravenous Q48H  . lisinopril  10 mg Oral Daily  . metoprolol tartrate  50 mg Oral BID  . predniSONE  60 mg Oral Q breakfast   Assessment: Empiric abx for CAP.  SCr has bumped up today Estimated Creatinine Clearance: 45.5 ml/min (by C-G formula based on Cr of 1.43).  Levaquin 11/2 >>  Goal of Therapy:  Eradicate infection  Plan:  Levaquin 750mg  IV q48hrs Monitor renal function Labs per protocol  Valrie Hart A 09/27/2013,8:59 AM

## 2013-09-27 NOTE — Progress Notes (Signed)
Pt seen and examined. Agree with assessement and plan per above. Pt with suspected CHF vs possible bronchitis. Currently with mild elevation of Cr so lasix decreased to PO. Cont steroids, nebs and O2 as needed. Currently on minimal O2 support. Vitals stable. Cont with lasix and abx.

## 2013-09-27 NOTE — Care Management Note (Addendum)
    Page 1 of 1   09/29/2013     8:06:20 AM   CARE MANAGEMENT NOTE 09/29/2013  Patient:  Gary Odonnell, Gary Odonnell   Account Number:  [id number]  Date Initiated:  09/26/2013  Documentation initiated by:  Lanier Clam  Subjective/Objective Assessment:   79 y/o m admitted w/chf.     Action/Plan:   From home.   Anticipated DC Date:  09/29/2013   Anticipated DC Plan:  HOME/SELF CARE      DC Planning Services  CM consult      Choice offered to / List presented to:             Status of service:  Completed, signed off Medicare Important Message given?   (If response is "NO", the following Medicare IM given date fields will be blank) Date Medicare IM given:   Date Additional Medicare IM given:    Discharge Disposition:  ACUTE TO ACUTE TRANS  Per UR Regulation:  Reviewed for med. necessity/level of care/duration of stay  If discussed at Long Length of Stay Meetings, dates discussed:    Comments:  09/29/13 0805 Arlyss Queen, RN BSN CM Pt transferring to [facility] for cath.  09/27/13 1055 Arlyss Queen, RN BSN CM Pt from home with wife and will return home at discharge. Pt is independent with ADL's. Will monitor for need for home O2. Will continue to follow.

## 2013-09-27 NOTE — Progress Notes (Signed)
*  PRELIMINARY RESULTS* Echocardiogram 2D Echocardiogram has been performed.  Gary Odonnell 09/27/2013, 12:24 PM

## 2013-09-27 NOTE — Clinical Social Work Note (Signed)
Patient wants to vote in tomorrow's election.  Board of Elections member will bring ballot to patient in hospital, will probably be tomorrow.  They will call to set up time.  Santa Genera, LCSW Clinical Social Worker [phone])

## 2013-09-27 NOTE — Clinical Social Work Note (Signed)
CSW received call from Board of [place] office. Pt must have close relative come to office and request emergency absentee ballot in order for someone to bring to hospital for pt to complete. Pt made aware.   Derenda Fennel, [place] [phone]

## 2013-09-28 ENCOUNTER — Encounter (HOSPITAL_COMMUNITY): Payer: Self-pay | Admitting: Adult Health

## 2013-09-28 DIAGNOSIS — I5023 Acute on chronic systolic (congestive) heart failure: Principal | ICD-10-CM

## 2013-09-28 DIAGNOSIS — N179 Acute kidney failure, unspecified: Secondary | ICD-10-CM

## 2013-09-28 DIAGNOSIS — I509 Heart failure, unspecified: Secondary | ICD-10-CM

## 2013-09-28 DIAGNOSIS — I2510 Atherosclerotic heart disease of native coronary artery without angina pectoris: Secondary | ICD-10-CM

## 2013-09-28 DIAGNOSIS — F17200 Nicotine dependence, unspecified, uncomplicated: Secondary | ICD-10-CM

## 2013-09-28 DIAGNOSIS — I10 Essential (primary) hypertension: Secondary | ICD-10-CM

## 2013-09-28 LAB — BASIC METABOLIC PANEL
BUN: 33 mg/dL — ABNORMAL HIGH (ref 6–23)
Calcium: 10.4 mg/dL (ref 8.4–10.5)
Chloride: 101 mEq/L (ref 96–112)
GFR calc non Af Amer: 39 mL/min — ABNORMAL LOW (ref >90)
Glucose, Bld: 128 mg/dL — ABNORMAL HIGH (ref 70–99)
Potassium: 4.1 mEq/L (ref 3.5–5.1)
Sodium: 141 mEq/L (ref 135–145)

## 2013-09-28 MED ORDER — SODIUM CHLORIDE 0.9 % IV SOLN: INTRAVENOUS | Status: DC

## 2013-09-28 MED ORDER — SODIUM CHLORIDE 0.9 % IV SOLN: 250.0000 mL | INTRAVENOUS | Status: DC | PRN

## 2013-09-28 MED ORDER — PREDNISONE 20 MG PO TABS
40.0000 mg | ORAL_TABLET | Freq: Every day | ORAL | Status: DC
  Administered 2013-09-30 – 2013-10-01 (×2): 40 mg via ORAL
  Filled 2013-09-28 (×4): qty 2

## 2013-09-28 MED ORDER — SODIUM CHLORIDE 0.9 % IJ SOLN
3.0000 mL | Freq: Two times a day (BID) | INTRAMUSCULAR | Status: DC
  Administered 2013-09-28: 3 mL via INTRAVENOUS

## 2013-09-28 MED ORDER — CARVEDILOL 12.5 MG PO TABS
12.5000 mg | ORAL_TABLET | Freq: Two times a day (BID) | ORAL | Status: DC
  Administered 2013-09-28 – 2013-10-01 (×5): 12.5 mg via ORAL
  Filled 2013-09-28 (×7): qty 1

## 2013-09-28 MED ORDER — SODIUM CHLORIDE 0.45 % IV SOLN
INTRAVENOUS | Status: DC
  Administered 2013-09-29: 05:00:00 via INTRAVENOUS

## 2013-09-28 MED ORDER — SODIUM CHLORIDE 0.9 % IJ SOLN: 3.0000 mL | INTRAMUSCULAR | Status: DC | PRN

## 2013-09-28 MED ORDER — LEVOFLOXACIN 750 MG PO TABS
750.0000 mg | ORAL_TABLET | ORAL | Status: DC
  Administered 2013-10-01: 750 mg via ORAL
  Filled 2013-09-28 (×2): qty 1

## 2013-09-28 NOTE — Clinical Documentation Improvement (Signed)
Possible Clinical Conditions?  Chronic Systolic Congestive Heart Failure Chronic Diastolic Congestive Heart Failure Chronic Systolic & Diastolic Congestive Heart Failure Acute Systolic Congestive Heart Failure Acute Diastolic Congestive Heart Failure Acute Systolic & Diastolic Congestive Heart Failure Acute on Chronic Systolic Congestive Heart Failure Acute on Chronic Diastolic Congestive Heart Failure Acute on Chronic Systolic & Diastolic Congestive Heart Failure Other Condition________________________________________ Cannot Clinically Determine  Supporting Information:  Risk Factors: Chief complaint Shortness of breath History of CAD Per H&P:  Principal Problem: CHF (congestive heart failure)--Suspected CHF exacerbation Advanced age: 79  Signs & Symptoms: Shortness of breath Wheezing Rhonchi  Diagnostics: BNP 12096 CXR: IMPRESSION: Persistent bilateral pleural effusions. Slightly increased right basilar densities could represent atelectasis or mild edema "Last was done several years ago, demonstrating EF 50-55%" Current Echo:  EF 15-20%  Treatment: Per H&P: Would continue lasix as tolerated Follow daily weights and i/o closely Follow renal panel and lytes, correct as needed Will check 2D echo Solumedrol, Atrovent neb and 40mg  Lasix given in ED Cardiology consult  Thank You, Harless Litten ,RN Clinical Documentation Specialist:  [phone]  [facility]- Health Information Management

## 2013-09-28 NOTE — Progress Notes (Signed)
ANTIBIOTIC CONSULT NOTE - follow up  Pharmacy Consult for Levaquin Indication: pneumonia  No Known Allergies  Patient Measurements: Height: 5\' 8"  (172.7 cm) Weight: 189 lb 9.5 oz (86 kg) IBW/kg (Calculated) : 68.4  Vital Signs: Temp: 97.7 F (36.5 C) (11/03 2230) Temp src: Oral (11/03 2230) BP: 137/92 mmHg (11/03 2230) Pulse Rate: 70 (11/03 2230) Intake/Output from previous day: 11/03 0701 - 11/04 0700 In: 480 [P.O.:480] Out: -  Intake/Output from this shift:    Labs:  Recent Labs  09/26/13 0840 09/27/13 0238 09/28/13 0450  WBC 7.4 9.2  --   HGB 15.0 14.2  --   PLT 187 182  --   CREATININE 1.10 1.43* 1.61*   Estimated Creatinine Clearance: 40.3 ml/min (by C-G formula based on Cr of 1.61). No results found for this basename: VANCOTROUGH, VANCOPEAK, VANCORANDOM, GENTTROUGH, GENTPEAK, GENTRANDOM, TOBRATROUGH, TOBRAPEAK, TOBRARND, AMIKACINPEAK, AMIKACINTROU, AMIKACIN,  in the last 72 hours   Microbiology: No results found for this or any previous visit (from the past 720 hour(s)).  Medical History: Past Medical History  Diagnosis Date  . Stroke   . Coronary artery disease   . Hypertension   . Pneumonia    Medications:  Scheduled:  . ipratropium  0.5 mg Nebulization Q4H WA   And  . albuterol  2.5 mg Nebulization Q4H WA  . aspirin EC  81 mg Oral Daily  . chlorhexidine  15 mL Mouth/Throat BID  . furosemide  20 mg Oral Daily  . heparin  5,000 Units Subcutaneous Q8H  . levofloxacin (LEVAQUIN) IV  750 mg Intravenous Q48H  . lisinopril  10 mg Oral Daily  . metoprolol tartrate  50 mg Oral BID  . predniSONE  60 mg Oral Q breakfast   Assessment: Empiric abx for CAP.  SCr continues to rise.  Pt is afebrile. Estimated Creatinine Clearance: 40.3 ml/min (by C-G formula based on Cr of 1.61).  Levaquin 11/2 >>  Goal of Therapy:  Eradicate infection  Plan:  Switch Levaquin to PO: 750mg  PO q48hrs (renally adjusted) Monitor renal function Labs per  protocol  Valrie Hart A 09/28/2013,8:29 AM

## 2013-09-28 NOTE — Progress Notes (Signed)
Pt is in a-fib. MD aware. EKG ordered.

## 2013-09-28 NOTE — Consult Note (Addendum)
CARDIOLOGY CONSULT NOTE   Patient ID: Gary Odonnell MRN: [id number] DOB/AGE: 06-10-1932 79 y.o.  Admit Date: 09/26/2013 Referring Physician: PTH Primary Physician: [name], MD Consulting Cardiologist: Dietrich Pates MD Primary Cardiologist: New  Reason for Consultation: Severe Systolic Dysfunction with known CAD  Clinical Summary Gary Odonnell is a 79 y.o.male with known history of coronary artery disease status post four-vessel coronary artery bypass grafting 14 years ago at [facility] in [facility], with a PAD stent to the right femoral artery and angioplasty to the left femoral artery 12 years ago, with known history of ongoing tobacco abuse, not hemorrhagic CVA, who has not been followed by a cardiologist since initial bypass grafting 14 years ago. The patient has moved to [place] approximately 7 years ago and is followed by primary care physician, Gary Odonnell.    The patient was admitted with increasing shortness of breath, for 3 weeks, PND and orthopnea. After coming to the ER x3 prior to this admission for similar symptoms he was admitted for CHF.    The patient states that he did not have any swelling, weight gain, or abdominal distention. In fact, he states he has been losing weight. On arrival to the emergency room the patient's blood pressure is 160/95 with a heart rate of 94 and O2 sat of 90 1%. Temperature was 99.1. Chest x-ray demonstrated bilateral pleural effusions, right basilar densities with possibility of atelectasis or mild edema. History with IV Lasix, Solu-Medrol, and started on Levaquin and nebulizer treatments in the setting of CAP. Pro BNP was elevated at 12,096, troponins were negative x4. Diuresis, the patient is feeling some better concerning his breathing status, and is now on room air.    The patient had echocardiogram completed revealing an EF of 15%, with severely dilated RV and LA. We are asked for cardiology recommendations in this  setting. He is since been placed on Coreg 12.5 twice a day, aspirin 81 mg daily and Lasix 20 mg daily. Creatinine is 1.61, and the patient has not been started on ACE or ARB. Blood pressure range 115 to 137 systolically. He denies any chest pain.   No Known Allergies  Medications Scheduled Medications: . ipratropium  0.5 mg Nebulization Q4H WA   And  . albuterol  2.5 mg Nebulization Q4H WA  . aspirin EC  81 mg Oral Daily  . carvedilol  12.5 mg Oral BID WC  . chlorhexidine  15 mL Mouth/Throat BID  . furosemide  20 mg Oral Daily  . heparin  5,000 Units Subcutaneous Q8H  . [START ON 09/29/2013] levofloxacin  750 mg Oral Q48H  . [START ON 09/29/2013] predniSONE  40 mg Oral Q breakfast    Infusions:    PRN Medications: acetaminophen, acetaminophen, antiseptic oral rinse, morphine injection, ondansetron (ZOFRAN) IV, ondansetron   Past Medical History  Diagnosis Date  . Stroke   . Coronary artery disease   . Hypertension   . Pneumonia   . PAD (peripheral artery disease)     Past Surgical History  Procedure Laterality Date  . Coronary artery bypass graft    . Knee arthroscopy    . Trigger finger release Bilateral   . Stent to right femoral artery    . Angioplasty to left femoral artery      Family History  Problem Relation Age of Onset  . Heart attack Father   . Diabetes Mother     Social History Gary Odonnell reports that he has been smoking Cigars.  He does not have any smokeless tobacco history on file. Gary Odonnell reports that he does not drink alcohol.  Review of Systems Otherwise reviewed and negative except as outlined.  Physical Examination Blood pressure 137/92, pulse 70, temperature 97.7 F (36.5 C), temperature source Oral, resp. rate 18, height 5\' 8"  (1.727 m), weight 189 lb 9.5 oz (86 kg), SpO2 98.00%.  Intake/Output Summary (Last 24 hours) at 09/28/13 1156 Last data filed at 09/27/13 1250  Gross per 24 hour  Intake    240 ml  Output      0 ml  Net     240 ml    Telemetry:  HEENT: Conjunctiva and lids normal, oropharynx clear with moist mucosa. Neck: Supple,  JVP increased at 9 cm or carotid bruits, no thyromegaly. Lungs: Crackles at L base   Cardiac: Regular rate and rhythm, no S3 or significant systolic murmur, no pericardial rub. Abdomen: Soft, nontender, no hepatomegaly, bowel sounds present, no guarding or rebound. Extremities: No pitting edema, distal pulses 2+. Skin: Warm and dry. Musculoskeletal: No kyphosis. Neuropsychiatric: Alert and oriented x3, affect grossly appropriate.  Prior Cardiac Testing/Procedures 1. Echocardiogram Study data: Technically adequate study - Left ventricle: The cavity size was normal. Wall thickness was increased in a pattern of moderate LVH. Systolic function was severely reduced. The estimated ejection fraction was in the range of 15% to 20%. Severe diffuse hypokinesis. Doppler parameters are consistent with restrictive physiology, indicative of decreased left ventricular diastolic compliance and/or increased left atrial pressure. - Regional wall motion abnormality: Akinesis of the mid anterior and mid anteroseptal myocardium. - Aortic valve: Mildly to moderately calcified annulus. Probably trileaflet; moderately thickened leaflets. Valve area: 1.79cm^2(VTI). Valve area: 1.72cm^2 (Vmax). - Mitral valve: Calcified annulus. Mildly thickened leaflets . Moderateregurgitation. The MR vena contracta is 0.4 cm. - Left atrium: The atrium was severely dilated. - Right ventricle: The cavity size was mildly dilated. Systolic function was severely reduced. RV TAPSE is 0.7 cm. - Right atrium: The atrium was severely dilated. - Pulmonary arteries: Systolic pressure was moderately increased. PA peak pressure: 61mm Hg (S).   Lab Results  Basic Metabolic Panel:  Recent Labs Lab 09/26/13 0840 09/27/13 0238 09/28/13 0450  NA 142 140 141  K 4.5 3.9 4.1  CL 104 102 101  CO2 26 27 31   GLUCOSE  125* 171* 128*  BUN 14 23 33*  CREATININE 1.10 1.43* 1.61*  CALCIUM 10.7* 10.1 10.4    Liver Function Tests:  Recent Labs Lab 09/27/13 0238  AST 24  ALT 19  ALKPHOS 82  BILITOT 1.2  PROT 6.7  ALBUMIN 3.3*    CBC:  Recent Labs Lab 09/26/13 0840 09/27/13 0238  WBC 7.4 9.2  NEUTROABS 5.2  --   HGB 15.0 14.2  HCT 46.9 44.1  MCV 94.4 93.2  PLT 187 182    Cardiac Enzymes:  Recent Labs Lab 09/26/13 0840 09/26/13 1425 09/26/13 2039 09/27/13 0238  TROPONINI <0.30 <0.30 <0.30 <0.30    BNP: 12,096 on admission.   Radiology: CHEST 2 VIEW FINDINGS: Two views of the chest were obtained. Again noted are bilateral pleural effusions, left side greater than right. Heart size is grossly stable. Slightly increased right basilar densities may represent increased atelectasis or mild dependent edema. Old compression fracture in the lower thoracic spine.  IMPRESSION: Persistent bilateral pleural effusions. Slightly increased right basilar densities could represent atelectasis or mild edema. Minimal change from the recent comparison examination.   ECG: NSR with 1st degree AV block LVH,  rate of 87 bpm.   Impression and Recommendations:  1. CAD: Patient is status post four-vessel coronary artery bypass grafting in [facility] in [facility], approximately 14 years ago with no cardiology followup.   No records  He said he had artery grafts to heart  No vein grafts.  He has been in the regional area x7 years and is being followed by his primary care physician. The patient has denied any recurrent chest pain palpitations or dizziness. Prior to admission he was only on aspirin. He was not a  on a beta blocker or ACE inhibitor at that time. Cardiac markers are negative x4 artery arguing against ACS.  2. Severe Systolic Dysfunction: Echocardiogram is very abnormal with an ejection fraction of 15%-20%, with associated dyspnea. LA was also severely dilated. This is  new He should have a R and L heart cath to redefine coronary anatomy and define pressures.  H He is now placed on Coreg, aspirin, and diuretic. Hold ACE I for now with planned cath [facility] today and O/N for optimizing renal function.  3. PAD: The patient has a history of femoral artery disease. He has had a stent to the right femoral artery, and angioplasty to the left femoral artery partially 12 years ago. He denies any recurrent discomfort in his legs. He is complaining of some left hip pain. He is ambulatory.  4. History of Non-hemorrhagic CVA: The patient had been on Aggrenox. But stopped this on his own due to excessive bruising. He just remains on aspirin. His recent CT scan was completed in September 2014, this demonstrated chronic left frontal infarct but no acute abnormality.  5. Ongoing tobacco abuse: The patient has a smoking since age of 7, with cigarettes and cigars. He states he has cut down over the last few years. Now less than a 1/2 ppd  .  6. Abnormal CT: CT scan demonstrated a 5 mm nodule in the superior segment of the left lower lobe. The patient has been losing weight. Has been having dyspnea on exertion for the last 3 weeks. It is recommended the patient have a repeat CT scan in 6-12 months, to rule out bronchiogenic carcinoma.      Signed: Bettey Mare. Lyman Bishop NP [facility] 09/28/2013, 11:56 AM   Patient seen and examined  I agree with findings as noted by Harriet Pho  I have amended note to reflect my findings.  I would recomm R and L heart cath to redefine anatomy  Hydrate today Will then optimize meds.   Check lipids in AM Follow BMET.    Dietrich Pates  Contacted hospital in [place] [facility] [place] Med Records fax there is [phone].  WIll send release of information  Dietrich Pates

## 2013-09-28 NOTE — Progress Notes (Signed)
Pt seen and examined. Agree with note above. Also had discussed case with Cardiology. Pt stable. Cards recs for heart cath tomorrow. Gentle hydration in preparation for cath. Plan transfer to [facility] tomorrow.

## 2013-09-28 NOTE — Progress Notes (Signed)
[facility] PROGRESS NOTE  Rogers Ditter WUJ:[id number] DOB: 15-Jan-1932 DOA: 09/26/2013 PCP: [name], MD  Assessment/Plan: 1. Suspected CHF exacerbation  1. volume status +420. Weight 86.0kg down from 86.3kg yesterday. conntinue lasix cautiously as creatinine continues to trend upward. CT yields EF 15%-20% with severe hypokinesis and moderate LVH suggestive systolic CHF. No hx of same. 2. Will request cards consult. Will likely need OP follow up as well 2. Possible acute bronchitis  1. Empiric Levaqin day #3.  2. Will taper steroids and continue bronchodilators 3. sats >90% on room air. Will ambulate and check sats  3. HTN  1. Improved control 2. No BP meds per home med list - pt elected to stop them, unknown what he had been taking before 3. BP in the stage 2 range. Change lopressor to coreg given #1. Hold home lisinopril due to renal function. Monitor closely  4. Hx CVA  1. Stable 5. DVT prophylaxis  1. Heparin subQ Acute renal failure: related to lasix. Continues to trend upward. Will hold lisinopril for now. Will need close op follow up.      Code Status: full Family Communication: wife at bedside Disposition Plan: home when ready   Consultants:  cardiology  Procedures:  none  Antibiotics:  levaquin 09/26/13>>>  HPI/Subjective: Sitting in chair. Reports feeling much better and want to go home.   Objective: Filed Vitals:   09/27/13 2230  BP: 137/92  Pulse: 70  Temp: 97.7 F (36.5 C)  Resp: 18    Intake/Output Summary (Last 24 hours) at 09/28/13 1117 Last data filed at 09/27/13 1250  Gross per 24 hour  Intake    240 ml  Output      0 ml  Net    240 ml   Filed Weights   09/26/13 0719 09/27/13 0427 09/28/13 0500  Weight: 85.276 kg (188 lb) 86.3 kg (190 lb 4.1 oz) 86 kg (189 lb 9.5 oz)    Exam:   General:  Well nourished NAD  Cardiovascular: RRR No MGR No LE edema  Respiratory: normal effort few crackles right base otherwise  clear bilaterally   Abdomen: soft +BS non-tender to palpation  Musculoskeletal: no clubbing no cyanosis   Data Reviewed: Basic Metabolic Panel:  Recent Labs Lab 09/26/13 0840 09/27/13 0238 09/28/13 0450  NA 142 140 141  K 4.5 3.9 4.1  CL 104 102 101  CO2 26 27 31   GLUCOSE 125* 171* 128*  BUN 14 23 33*  CREATININE 1.10 1.43* 1.61*  CALCIUM 10.7* 10.1 10.4   Liver Function Tests:  Recent Labs Lab 09/27/13 0238  AST 24  ALT 19  ALKPHOS 82  BILITOT 1.2  PROT 6.7  ALBUMIN 3.3*   No results found for this basename: LIPASE, AMYLASE,  in the last 168 hours No results found for this basename: AMMONIA,  in the last 168 hours CBC:  Recent Labs Lab 09/26/13 0840 09/27/13 0238  WBC 7.4 9.2  NEUTROABS 5.2  --   HGB 15.0 14.2  HCT 46.9 44.1  MCV 94.4 93.2  PLT 187 182   Cardiac Enzymes:  Recent Labs Lab 09/26/13 0840 09/26/13 1425 09/26/13 2039 09/27/13 0238  TROPONINI <0.30 <0.30 <0.30 <0.30   BNP (last 3 results)  Recent Labs  09/26/13 0840  PROBNP 12096.0*   CBG: No results found for this basename: GLUCAP,  in the last 168 hours  No results found for this or any previous visit (from the past 240 hour(s)).   Studies: No results found.  Scheduled Meds: . ipratropium  0.5 mg Nebulization Q4H WA   And  . albuterol  2.5 mg Nebulization Q4H WA  . aspirin EC  81 mg Oral Daily  . chlorhexidine  15 mL Mouth/Throat BID  . furosemide  20 mg Oral Daily  . heparin  5,000 Units Subcutaneous Q8H  . [START ON 09/29/2013] levofloxacin  750 mg Oral Q48H  . lisinopril  10 mg Oral Daily  . metoprolol tartrate  50 mg Oral BID  . predniSONE  60 mg Oral Q breakfast   Continuous Infusions:   Principal Problem:   CHF (congestive heart failure) Active Problems:   HTN (hypertension)   CVA (cerebral infarction)   CAP (community acquired pneumonia)   CAD (coronary atherosclerotic disease)   S/P CABG x 4   Acute renal failure    Time spent: 35  minutes    [facility] M  [facility] Pager [phone]. If 7PM-7AM, please contact night-coverage at [email or web address], password [facility] 09/28/2013, 11:17 AM  LOS: 2 days

## 2013-09-29 ENCOUNTER — Ambulatory Visit (HOSPITAL_COMMUNITY): Admit: 2013-09-29 | Payer: Self-pay | Admitting: Cardiovascular Disease

## 2013-09-29 ENCOUNTER — Encounter (HOSPITAL_COMMUNITY)
Admission: EM | Disposition: A | Discharge status: Home / Self Care | Payer: Self-pay | Source: Home / Self Care | Attending: Internal Medicine

## 2013-09-29 DIAGNOSIS — I2510 Atherosclerotic heart disease of native coronary artery without angina pectoris: Secondary | ICD-10-CM

## 2013-09-29 HISTORY — PX: LEFT AND RIGHT HEART CATHETERIZATION WITH CORONARY ANGIOGRAM: SHX5449

## 2013-09-29 LAB — POCT I-STAT 3, VENOUS BLOOD GAS (G3P V)
Acid-Base Excess: 4 mmol/L — ABNORMAL HIGH (ref 0.0–2.0)
O2 Saturation: 50 %
TCO2: 32 mmol/L (ref 0–100)
pH, Ven: 7.364 — ABNORMAL HIGH (ref 7.250–7.300)

## 2013-09-29 LAB — LIPID PANEL
HDL: 31 mg/dL — ABNORMAL LOW (ref >39)
LDL Cholesterol: 126 mg/dL — ABNORMAL HIGH (ref 0–99)
Total CHOL/HDL Ratio: 5.8 RATIO

## 2013-09-29 LAB — PROTIME-INR
INR: 1.31 (ref 0.00–1.49)
Prothrombin Time: 16 seconds — ABNORMAL HIGH (ref 11.6–15.2)

## 2013-09-29 LAB — POCT I-STAT 3, ART BLOOD GAS (G3+)
Acid-Base Excess: 2 mmol/L (ref 0.0–2.0)
pO2, Arterial: 63 mmHg — ABNORMAL LOW (ref 80.0–100.0)

## 2013-09-29 LAB — BASIC METABOLIC PANEL
CO2: 32 mEq/L (ref 19–32)
Calcium: 10.3 mg/dL (ref 8.4–10.5)
Chloride: 102 mEq/L (ref 96–112)
GFR calc non Af Amer: 45 mL/min — ABNORMAL LOW (ref >90)
Sodium: 142 mEq/L (ref 135–145)

## 2013-09-29 LAB — MRSA PCR SCREENING: MRSA by PCR: NEGATIVE

## 2013-09-29 LAB — CBC
Hemoglobin: 13.7 g/dL (ref 13.0–17.0)
MCHC: 32 g/dL (ref 30.0–36.0)
RBC: 4.56 MIL/uL (ref 4.22–5.81)
RDW: 14.7 % (ref 11.5–15.5)

## 2013-09-29 SURGERY — LEFT AND RIGHT HEART CATHETERIZATION WITH CORONARY ANGIOGRAM
Anesthesia: LOCAL

## 2013-09-29 MED ORDER — SODIUM CHLORIDE 0.9 % IV SOLN
INTRAVENOUS | Status: AC
  Administered 2013-09-29: 11:00:00 via INTRAVENOUS

## 2013-09-29 MED ORDER — FUROSEMIDE 10 MG/ML IJ SOLN
INTRAMUSCULAR | Status: AC
  Filled 2013-09-29: qty 4

## 2013-09-29 MED ORDER — FUROSEMIDE 10 MG/ML IJ SOLN
40.0000 mg | Freq: Two times a day (BID) | INTRAMUSCULAR | Status: DC
  Administered 2013-09-29 (×2): 40 mg via INTRAVENOUS
  Filled 2013-09-29 (×3): qty 4

## 2013-09-29 MED ORDER — HEPARIN (PORCINE) IN NACL 2-0.9 UNIT/ML-% IJ SOLN
INTRAMUSCULAR | Status: AC
Start: 2013-09-29 — End: 2013-09-29
  Filled 2013-09-29: qty 1000

## 2013-09-29 MED ORDER — LIDOCAINE HCL (PF) 1 % IJ SOLN
INTRAMUSCULAR | Status: AC
  Filled 2013-09-29: qty 30

## 2013-09-29 MED ORDER — NITROGLYCERIN 0.2 MG/ML ON CALL CATH LAB
INTRAVENOUS | Status: AC
  Filled 2013-09-29: qty 1

## 2013-09-29 MED ORDER — ATORVASTATIN CALCIUM 40 MG PO TABS
40.0000 mg | ORAL_TABLET | Freq: Every day | ORAL | Status: DC
  Filled 2013-09-29 (×2): qty 1

## 2013-09-29 MED ORDER — FENTANYL CITRATE 0.05 MG/ML IJ SOLN
INTRAMUSCULAR | Status: AC
  Filled 2013-09-29: qty 2

## 2013-09-29 MED ORDER — MIDAZOLAM HCL 2 MG/2ML IJ SOLN
INTRAMUSCULAR | Status: AC
  Filled 2013-09-29: qty 2

## 2013-09-29 NOTE — H&P (View-Only) (Signed)
  CARDIOLOGY CONSULT NOTE   Patient ID: Gary Odonnell MRN: [id number] DOB/AGE: 77/21/1933 78 y.o.  Admit Date: 09/26/2013 Referring Physician: PTH Primary Physician: Gary Odonnell,Gary ROBERT, MD Consulting Cardiologist: Ross, Paula MD Primary Cardiologist: New  Reason for Consultation: Severe Systolic Dysfunction with known CAD  Clinical Summary Gary Odonnell is a 80 y.o.male with known history of coronary artery disease status post four-vessel coronary artery bypass grafting 14 years ago at [facility] in [place], with a PAD stent to the right femoral artery and angioplasty to the left femoral artery 12 years ago, with known history of ongoing tobacco abuse, not hemorrhagic CVA, who has not been followed by a cardiologist since initial bypass grafting 14 years ago. The patient has moved to [place] approximately 7 years ago and is followed by primary care physician, Gary Odonnell.    The patient was admitted with increasing shortness of breath, for 3 weeks, PND and orthopnea. After coming to the ER x3 prior to this admission for similar symptoms he was admitted for CHF.    The patient states that he did not have any swelling, weight gain, or abdominal distention. In fact, he states he has been losing weight. On arrival to the emergency room the patient's blood pressure is 160/95 with a heart rate of 94 and O2 sat of 90 1%. Temperature was 99.1. Chest x-ray demonstrated bilateral pleural effusions, right basilar densities with possibility of atelectasis or mild edema. History with IV Lasix, Solu-Medrol, and started on Levaquin and nebulizer treatments in the setting of CAP. Pro BNP was elevated at 12,096, troponins were negative x4. Diuresis, the patient is feeling some better concerning his breathing status, and is now on room air.    The patient had echocardiogram completed revealing an EF of 15%, with severely dilated RV and LA. We are asked for cardiology recommendations in this  setting. He is since been placed on Coreg 12.5 twice a day, aspirin 81 mg daily and Lasix 20 mg daily. Creatinine is 1.61, and the patient has not been started on ACE or ARB. Blood pressure range 115 to 137 systolically. He denies any chest pain.   No Known Allergies  Medications Scheduled Medications: . ipratropium  0.5 mg Nebulization Q4H WA   And  . albuterol  2.5 mg Nebulization Q4H WA  . aspirin EC  81 mg Oral Daily  . carvedilol  12.5 mg Oral BID WC  . chlorhexidine  15 mL Mouth/Throat BID  . furosemide  20 mg Oral Daily  . heparin  5,000 Units Subcutaneous Q8H  . [START ON 09/29/2013] levofloxacin  750 mg Oral Q48H  . [START ON 09/29/2013] predniSONE  40 mg Oral Q breakfast    Infusions:    PRN Medications: acetaminophen, acetaminophen, antiseptic oral rinse, morphine injection, ondansetron (ZOFRAN) IV, ondansetron   Past Medical History  Diagnosis Date  . Stroke   . Coronary artery disease   . Hypertension   . Pneumonia   . PAD (peripheral artery disease)     Past Surgical History  Procedure Laterality Date  . Coronary artery bypass graft    . Knee arthroscopy    . Trigger finger release Bilateral   . Stent to right femoral artery    . Angioplasty to left femoral artery      Family History  Problem Relation Age of Onset  . Heart attack Father   . Diabetes Mother     Social History [name] reports that he has been smoking Cigars.    He does not have any smokeless tobacco history on file. [name] reports that he does not drink alcohol.  Review of Systems Otherwise reviewed and negative except as outlined.  Physical Examination Blood pressure 137/92, pulse 70, temperature 97.7 F (36.5 C), temperature source Oral, resp. rate 18, height 5' 8" (1.727 m), weight 189 lb 9.5 oz (86 kg), SpO2 98.00%.  Intake/Output Summary (Last 24 hours) at 09/28/13 1156 Last data filed at 09/27/13 1250  Gross per 24 hour  Intake    240 ml  Output      0 ml  Net     240 ml    Telemetry:  HEENT: Conjunctiva and lids normal, oropharynx clear with moist mucosa. Neck: Supple,  JVP increased at 9 cm or carotid bruits, no thyromegaly. Lungs: Crackles at L base   Cardiac: Regular rate and rhythm, no S3 or significant systolic murmur, no pericardial rub. Abdomen: Soft, nontender, no hepatomegaly, bowel sounds present, no guarding or rebound. Extremities: No pitting edema, distal pulses 2+. Skin: Warm and dry. Musculoskeletal: No kyphosis. Neuropsychiatric: Alert and oriented x3, affect grossly appropriate.  Prior Cardiac Testing/Procedures 1. Echocardiogram Study data: Technically adequate study - Left ventricle: The cavity size was normal. Wall thickness was increased in a pattern of moderate LVH. Systolic function was severely reduced. The estimated ejection fraction was in the range of 15% to 20%. Severe diffuse hypokinesis. Doppler parameters are consistent with restrictive physiology, indicative of decreased left ventricular diastolic compliance and/or increased left atrial pressure. - Regional wall motion abnormality: Akinesis of the mid anterior and mid anteroseptal myocardium. - Aortic valve: Mildly to moderately calcified annulus. Probably trileaflet; moderately thickened leaflets. Valve area: 1.79cm^2(VTI). Valve area: 1.72cm^2 (Vmax). - Mitral valve: Calcified annulus. Mildly thickened leaflets . Moderateregurgitation. The MR vena contracta is 0.4 cm. - Left atrium: The atrium was severely dilated. - Right ventricle: The cavity size was mildly dilated. Systolic function was severely reduced. RV TAPSE is 0.7 cm. - Right atrium: The atrium was severely dilated. - Pulmonary arteries: Systolic pressure was moderately increased. PA peak pressure: 61mm Hg (S).   Lab Results  Basic Metabolic Panel:  Recent Labs Lab 09/26/13 0840 09/27/13 0238 09/28/13 0450  NA 142 140 141  K 4.5 3.9 4.1  CL 104 102 101  CO2 26 27 31  GLUCOSE  125* 171* 128*  BUN 14 23 33*  CREATININE 1.10 1.43* 1.61*  CALCIUM 10.7* 10.1 10.4    Liver Function Tests:  Recent Labs Lab 09/27/13 0238  AST 24  ALT 19  ALKPHOS 82  BILITOT 1.2  PROT 6.7  ALBUMIN 3.3*    CBC:  Recent Labs Lab 09/26/13 0840 09/27/13 0238  WBC 7.4 9.2  NEUTROABS 5.2  --   HGB 15.0 14.2  HCT 46.9 44.1  MCV 94.4 93.2  PLT 187 182    Cardiac Enzymes:  Recent Labs Lab 09/26/13 0840 09/26/13 1425 09/26/13 2039 09/27/13 0238  TROPONINI <0.30 <0.30 <0.30 <0.30    BNP: 12,096 on admission.   Radiology: CHEST 2 VIEW FINDINGS: Two views of the chest were obtained. Again noted are bilateral pleural effusions, left side greater than right. Heart size is grossly stable. Slightly increased right basilar densities may represent increased atelectasis or mild dependent edema. Old compression fracture in the lower thoracic spine.  IMPRESSION: Persistent bilateral pleural effusions. Slightly increased right basilar densities could represent atelectasis or mild edema. Minimal change from the recent comparison examination.   ECG: NSR with 1st degree AV block LVH,   rate of 87 bpm.   Impression and Recommendations:  1. CAD: Patient is status post four-vessel coronary artery bypass grafting in [facility] in [place], approximately 14 years ago with no cardiology followup.   No records  He said he had artery grafts to heart  No vein grafts.  He has been in the regional area x7 years and is being followed by his primary care physician. The patient has denied any recurrent chest pain palpitations or dizziness. Prior to admission he was only on aspirin. He was not a  on a beta blocker or ACE inhibitor at that time. Cardiac markers are negative x4 artery arguing against ACS.  2. Severe Systolic Dysfunction: Echocardiogram is very abnormal with an ejection fraction of 15%-20%, with associated dyspnea. LA was also severely dilated. This is  new He should have a R and L heart cath to redefine coronary anatomy and define pressures.  H He is now placed on Coreg, aspirin, and diuretic. Hold ACE I for now with planned cath Hydrate today and O/N for optimizing renal function.  3. PAD: The patient has a history of femoral artery disease. He has had a stent to the right femoral artery, and angioplasty to the left femoral artery partially 12 years ago. He denies any recurrent discomfort in his legs. He is complaining of some left hip pain. He is ambulatory.  4. History of Non-hemorrhagic CVA: The patient had been on Aggrenox. But stopped this on his own due to excessive bruising. He just remains on aspirin. His recent CT scan was completed in September 2014, this demonstrated chronic left frontal infarct but no acute abnormality.  5. Ongoing tobacco abuse: The patient has a smoking since age of 16, with cigarettes and cigars. He states he has cut down over the last few years. Now less than a 1/2 ppd  .  6. Abnormal CT: CT scan demonstrated a 5 mm nodule in the superior segment of the left lower lobe. The patient has been losing weight. Has been having dyspnea on exertion for the last 3 weeks. It is recommended the patient have a repeat CT scan in 6-12 months, to rule out bronchiogenic carcinoma.      Signed: Kathryn M. Lawrence NP [facility] 09/28/2013, 11:56 AM   Patient seen and examined  I agree with findings as noted by K Lawrence  I have amended note to reflect my findings.  I would recomm R and L heart cath to redefine anatomy  Hydrate today Will then optimize meds.   Check lipids in AM Follow BMET.    Odonnell Ross  Contacted hospital in [place] ([facility]) Med Records fax there is [phone].  WIll send release of information  Odonnell Ross   

## 2013-09-29 NOTE — Interval H&P Note (Signed)
History and Physical Interval Note:  09/29/2013 9:49 AM  Ozzie Hoyle  has presented today for cardiac cath with the diagnosis of CAD, acute CHF, cardiomyopathy. The various methods of treatment have been discussed with the patient and family. After consideration of risks, benefits and other options for treatment, the patient has consented to  Procedure(s): LEFT AND RIGHT HEART CATHETERIZATION WITH CORONARY ANGIOGRAM (N/A) as a surgical intervention .  The patient's history has been reviewed, patient examined, no change in status, stable for surgery.  I have reviewed the patient's chart and labs.  Questions were answered to the patient's satisfaction.    Cath Lab Visit (complete for each Cath Lab visit)  Clinical Evaluation Leading to the Procedure:   ACS: no  Non-ACS:    Anginal Classification: CCS II  Anti-ischemic medical therapy: Minimal Therapy (1 class of medications)  Non-Invasive Test Results: No non-invasive testing performed  Prior CABG: Previous CABG        MCALHANY,CHRISTOPHER

## 2013-09-29 NOTE — CV Procedure (Signed)
      Cardiac Catheterization Operative Report  Gary Odonnell [id number] 11/5/201410:26 AM [name], MD  Primary Cardiologist: Dr. Dietrich Pates  Procedure Performed:  1. Left Heart Catheterization 2. Selective Coronary Angiography 3. Right Heart Catheterization 4. LIMA graft angiography  Operator: Verne Carrow, MD  Indication:   79 yo male with history of tobacco abuse, CAD s/p 4 V CABG 2003 admitted to [facility] with SOB, found to have LVEF=15%. Transferred to [facility] by Dr. Tenny Craw for right and left heart cath.                              Procedure Details: The risks, benefits, complications, treatment options, and expected outcomes were discussed with the patient. The patient and/or family concurred with the proposed plan, giving informed consent. The patient was brought to the cath lab after IV hydration was begun and oral premedication was given. The patient was further sedated with Versed and Fentanyl. The right groin was prepped and draped in the usual manner. Using the modified Seldinger access technique, a 5 French sheath was placed in the femoral artery. A 6 French sheath was inserted into the right femoral vein. A multi-purpose catheter was used to perform a right heart catheterization. Standard diagnostic catheters were used to perform selective coronary angiography. An IMA catheter was used to engage the LIMA graft. A pigtail catheter was used to measure LV pressures. There were no immediate complications. The patient was taken to the recovery area in stable condition.   Hemodynamic Findings: Ao:  162/70              LV: 163/19/29 RA: 12          RV: 66/7/16 PA:   67/26 (mean 43) PCWP: 22 Fick Cardiac Output: 3.5 L/min Fick Cardiac Index: 1.74 L/min/m2 Central Aortic Saturation: 91% Pulmonary Artery Saturation: 50%  Angiographic Findings:  Left main: Distal 30% stenosis.   Left Anterior Descending Artery: 100% proximal occlusion. Proximal, mid and distal  vessel fills from the patent IMA graft.   Circumflex Artery: Large caliber vessel with large intermediate branch. Diffuse non-obstructive plaque in the large bifurcating intermediate branch. The ostium of the Circumflex has 50% stenosis.   Right Coronary Artery: Large dominant vessel with 100% mid occlusion. The distal vessel fills right to right bridging collaterals and left to right collaterals.   Graft Anatomy:  LIMA to LAD is patent Free Radial (? Y graft from LIMA) to OM1, OM2, PDA is occluded.  Left Ventricular Angiogram: Deferred.   Impression: 1. Triple vessel CAD with patent LIMA to LAD, occluded RCA with good collateral filling, moderate disease in Circumflex 2. No focal targets for PCI 3. Ischemic cardiomyopathy with acute systolic CHF  Recommendations: Continue medical management. He will need further diuresis.        Complications:  None; patient tolerated the procedure well.

## 2013-09-30 ENCOUNTER — Inpatient Hospital Stay (HOSPITAL_COMMUNITY): Payer: Medicare Other

## 2013-09-30 LAB — BASIC METABOLIC PANEL
BUN: 23 mg/dL (ref 6–23)
BUN: 27 mg/dL — ABNORMAL HIGH (ref 6–23)
Chloride: 100 mEq/L (ref 96–112)
Chloride: 99 mEq/L (ref 96–112)
Creatinine, Ser: 1.29 mg/dL (ref 0.50–1.35)
GFR calc Af Amer: 60 mL/min — ABNORMAL LOW (ref >90)
GFR calc Af Amer: 52 mL/min — ABNORMAL LOW (ref >90)
GFR calc non Af Amer: 51 mL/min — ABNORMAL LOW (ref >90)
GFR calc non Af Amer: 45 mL/min — ABNORMAL LOW (ref >90)
Potassium: 3.6 mEq/L (ref 3.5–5.1)
Potassium: 3.8 mEq/L (ref 3.5–5.1)
Sodium: 143 mEq/L (ref 135–145)
Sodium: 142 mEq/L (ref 135–145)

## 2013-09-30 LAB — PRO B NATRIURETIC PEPTIDE: Pro B Natriuretic peptide (BNP): 7713 pg/mL — ABNORMAL HIGH (ref 0–450)

## 2013-09-30 MED ORDER — FUROSEMIDE 10 MG/ML IJ SOLN
60.0000 mg | Freq: Two times a day (BID) | INTRAMUSCULAR | Status: DC
  Administered 2013-09-30: 60 mg via INTRAVENOUS
  Filled 2013-09-30 (×2): qty 6

## 2013-09-30 MED ORDER — HYDRALAZINE HCL 25 MG PO TABS
25.0000 mg | ORAL_TABLET | Freq: Two times a day (BID) | ORAL | Status: DC
  Administered 2013-09-30 – 2013-10-01 (×3): 25 mg via ORAL
  Filled 2013-09-30 (×4): qty 1

## 2013-09-30 MED ORDER — HEPARIN SODIUM (PORCINE) 5000 UNIT/ML IJ SOLN
5000.0000 [IU] | Freq: Three times a day (TID) | INTRAMUSCULAR | Status: DC
  Administered 2013-09-30 – 2013-10-01 (×3): 5000 [IU] via SUBCUTANEOUS
  Filled 2013-09-30 (×7): qty 1

## 2013-09-30 MED ORDER — FUROSEMIDE 10 MG/ML IJ SOLN: 60.0000 mg | Freq: Two times a day (BID) | INTRAMUSCULAR | Status: DC

## 2013-09-30 MED ORDER — EZETIMIBE 10 MG PO TABS
10.0000 mg | ORAL_TABLET | Freq: Every day | ORAL | Status: DC
  Administered 2013-09-30 – 2013-10-01 (×2): 10 mg via ORAL
  Filled 2013-09-30 (×2): qty 1

## 2013-09-30 MED ORDER — ISOSORBIDE MONONITRATE ER 30 MG PO TB24
30.0000 mg | ORAL_TABLET | Freq: Every day | ORAL | Status: DC
  Administered 2013-09-30 – 2013-10-01 (×2): 30 mg via ORAL
  Filled 2013-09-30 (×2): qty 1

## 2013-09-30 MED ORDER — POTASSIUM CHLORIDE CRYS ER 20 MEQ PO TBCR
20.0000 meq | EXTENDED_RELEASE_TABLET | Freq: Two times a day (BID) | ORAL | Status: DC
  Administered 2013-09-30 (×2): 20 meq via ORAL
  Filled 2013-09-30 (×4): qty 1

## 2013-09-30 NOTE — Progress Notes (Signed)
Utilization review completed.  

## 2013-09-30 NOTE — Progress Notes (Signed)
Subjective: No CP  No SOB Objective: Filed Vitals:   09/29/13 1738 09/29/13 1940 09/29/13 2105 09/30/13 0429  BP: 154/86 123/69  153/74  Pulse: 66 57  65  Temp:  97.8 F (36.6 C)  98 F (36.7 C)  TempSrc:    Oral  Resp:  18  18  Height:      Weight:    187 lb 12.8 oz (85.186 kg)  SpO2:  90% 93%    Weight change: -3 lb 6.4 oz (-1.542 kg)  Intake/Output Summary (Last 24 hours) at 09/30/13 0758 Last data filed at 09/29/13 2145  Gross per 24 hour  Intake      0 ml  Output   1450 ml  Net  -1450 ml    General: Alert, awake, oriented x3, in no acute distress Neck:  JVP is normal Heart: Regular rate and rhythm, without murmurs, rubs, gallops.  Lungs: rhonchi at L base Exemities:  No edema.   Neuro: Grossly intact, nonfocal.  Tele:  SR  Lab Results: Results for orders placed during the hospital encounter of 09/26/13 (from the past 24 hour(s))  POCT I-STAT 3, BLOOD GAS (G3+)     Status: Abnormal   Collection Time    09/29/13 10:04 AM      Result Value Range   pH, Arterial 7.375  7.350 - 7.450   pCO2 arterial 47.1 (*) 35.0 - 45.0 mmHg   pO2, Arterial 63.0 (*) 80.0 - 100.0 mmHg   Bicarbonate 27.5 (*) 20.0 - 24.0 mEq/L   TCO2 29  0 - 100 mmol/L   O2 Saturation 91.0     Acid-Base Excess 2.0  0.0 - 2.0 mmol/L   Sample type ARTERIAL    POCT I-STAT 3, BLOOD GAS (G3P V)     Status: Abnormal   Collection Time    09/29/13 10:07 AM      Result Value Range   pH, Ven 7.364 (*) 7.250 - 7.300   pCO2, Ven 52.9 (*) 45.0 - 50.0 mmHg   pO2, Ven 29.0 (*) 30.0 - 45.0 mmHg   Bicarbonate 30.2 (*) 20.0 - 24.0 mEq/L   TCO2 32  0 - 100 mmol/L   O2 Saturation 50.0     Acid-Base Excess 4.0 (*) 0.0 - 2.0 mmol/L   Sample type VENOUS     Comment NOTIFIED PHYSICIAN      Studies/Results: @RISRSLT24 @  Medications: Reviewed   @PROBHOSP @  1.  CAD  Cath yesterday showed 100: LAD  LIMA to LAD patient; LC with 50^ ostial; RCA large , dominant.  100% mid  Distal vessel with R to R and L to R  collaterals  INcreased R sided pressuress.  LVgram not done  LVEF 15 to 20% by echo  CLinically improved  Will check labs  Hold lasix for now Add hydralazine and imdur  2.  Acute on chronic systolic CHF  As above  3.  Lipid  Refuses statin  "makes my hip hurt"  Will add Zetia 10  LOS: 4 days   Dietrich Pates 09/30/2013, 7:58 AM

## 2013-09-30 NOTE — Progress Notes (Signed)
Nutrition Education Note  RD consulted for nutrition education regarding history of CHF. Patient reports that he has had low sodium education in the past and he has cut back on the amount of sodium that he uses.   RD provided "Low Sodium Nutrition Therapy" handout from the Academy of [employer]. Reviewed patient's dietary recall. Provided examples on ways to decrease sodium intake in diet. Discouraged intake of processed foods and use of salt shaker. Encouraged fresh fruits and vegetables as well as whole grain sources of carbohydrates to maximize fiber intake.   RD discussed why it is important for patient to adhere to diet recommendations, and emphasized the role of fluids, foods to avoid, and importance of weighing self daily. Teach back method used.  Expect good compliance.  Body mass index is 26.2 kg/(m^2). Pt meets criteria for normal weight based on current BMI.  Current diet order is heart healthy, patient is consuming approximately 100% of meals at this time. Labs and medications reviewed. No further nutrition interventions warranted at this time. RD contact information provided. If additional nutrition issues arise, please re-consult RD.   Joaquin Courts, RD, LDN, CNSC Pager [phone] After Hours Pager [phone]

## 2013-10-01 DIAGNOSIS — Z951 Presence of aortocoronary bypass graft: Secondary | ICD-10-CM

## 2013-10-01 LAB — PRO B NATRIURETIC PEPTIDE: Pro B Natriuretic peptide (BNP): 7371 pg/mL — ABNORMAL HIGH (ref 0–450)

## 2013-10-01 MED ORDER — LEVOFLOXACIN 750 MG PO TABS: 750.0000 mg | ORAL_TABLET | ORAL | Status: DC

## 2013-10-01 MED ORDER — EZETIMIBE 10 MG PO TABS: 10.0000 mg | ORAL_TABLET | Freq: Every day | ORAL | Status: DC

## 2013-10-01 MED ORDER — CARVEDILOL 12.5 MG PO TABS: 12.5000 mg | ORAL_TABLET | Freq: Two times a day (BID) | ORAL | Status: DC

## 2013-10-01 MED ORDER — ASPIRIN 81 MG PO TABS: 81.0000 mg | ORAL_TABLET | Freq: Every day | ORAL | Status: DC

## 2013-10-01 MED ORDER — PREDNISONE 10 MG PO TABS: 10.0000 mg | ORAL_TABLET | Freq: Every day | ORAL | Status: DC

## 2013-10-01 MED ORDER — HYDRALAZINE HCL 25 MG PO TABS: 25.0000 mg | ORAL_TABLET | Freq: Two times a day (BID) | ORAL | Status: DC

## 2013-10-01 MED ORDER — ISOSORBIDE MONONITRATE ER 30 MG PO TB24: 30.0000 mg | ORAL_TABLET | Freq: Every day | ORAL | Status: DC

## 2013-10-01 MED ORDER — POTASSIUM CHLORIDE CRYS ER 10 MEQ PO TBCR
10.0000 meq | EXTENDED_RELEASE_TABLET | Freq: Every day | ORAL | Status: DC
  Administered 2013-10-01: 10 meq via ORAL
  Filled 2013-10-01: qty 1

## 2013-10-01 MED ORDER — POTASSIUM CHLORIDE CRYS ER 10 MEQ PO TBCR: 10.0000 meq | EXTENDED_RELEASE_TABLET | Freq: Every day | ORAL | Status: DC

## 2013-10-01 MED ORDER — FUROSEMIDE 20 MG PO TABS: 60.0000 mg | ORAL_TABLET | Freq: Every day | ORAL | Status: DC

## 2013-10-01 NOTE — Discharge Summary (Signed)
CARDIOLOGY DISCHARGE SUMMARY   Patient ID: Renwick Asman MRN: [id number] DOB/AGE: 1932/10/08 79 y.o.  Admit date: 09/26/2013 Discharge date: 10/01/2013  Primary Discharge Diagnosis:    CHF (congestive heart failure) Secondary Discharge Diagnosis:    HTN (hypertension)   CVA (cerebral infarction)   CAP (community acquired pneumonia)   CAD (coronary atherosclerotic disease)   S/P CABG x 4   Acute renal failure   Hyperlipidemia  Procedure Performed:  1. Left Heart Catheterization 2. Selective Coronary Angiography 3. Right Heart Catheterization 4. LIMA graft angiography 5. 2-D echocardiogram  Hospital Course: Graeme Menees is a 79 y.o. male with a history of CAD. He initially went to [facility] emergency room with worsening shortness of breath. His oxygen saturation was decreased tenderness chest x-ray showed pulmonary edema. His BNP was elevated. He was given IV Lasix and admitted. On 09/28/2013, cardiology consult was called because an echocardiogram showed his EF to be 15%. He was seen by cardiology and transferred to [facility].  Prior to transfer, he was started on aspirin and Coreg 12.5 mg twice a day. He was on Lasix but his creatinine was elevated so he was not started on an ACE inhibitor or an ARB. He was not having any chest pain. Cardiac catheterization was indicated to further define his anatomy. This was performed on 09/29/2013.  Cardiac catheterization results are below. He was shown to have three-vessel coronary artery disease with a patent LIMA-LAD. He had moderate disease in the circumflex and his RCA was occluded but had collaterals. Medical management was recommended. His LVEDP was elevated so he was felt to need further diuresis.  He was diuresed as needed with IV Lasix but this was done very carefully because of his renal insufficiency. On admission, his BUN was 23 with creatinine 1.43. His creatinine increased to 1.61 with diuresis but as his diuretics were  adjusted, his BUN was 27 with creatinine 1.45 at discharge. This will be followed closely. He was started on hydralazine and Imdur, and is on a beta blocker, but will not be on an ACE or ARB because of his kidney function.  His chest x-ray showed community acquired pneumonia so he was started on antibiotics. Initially they were given IV but changed to oral medication as he improved. He will complete antibiotic therapy as an outpatient.  A lipid profile was performed and reviewed. It showed a low HDL and elevated LDL. He stated he has not tolerated statins in the past because they caused body aches. He was started on set it 10 mg daily.  He was seen by a dietitian for nutrition education regarding his CHF. The patient was receptive to the information and seems willing to make changes.  On 10/01/2013, Mr. Levit was seen by Dr. Tenny Craw. No further inpatient workup was indicated. Dr. Tenny Craw evaluated the patient and reviewed all data. He is considered stable for discharge, in improved condition, to followup in [place].  Labs:  Lab Results  Component Value Date   WBC 6.1 09/29/2013   HGB 13.7 09/29/2013   HCT 42.8 09/29/2013   MCV 93.9 09/29/2013   PLT 179 09/29/2013    Recent Labs Lab 09/27/13 0238  09/30/13 1419  NA 140  < > 142  K 3.9  < > 3.8  CL 102  < > 99  CO2 27  < > 33*  BUN 23  < > 27*  CREATININE 1.43*  < > 1.45*  CALCIUM 10.1  < > 10.4  PROT 6.7  --   --  BILITOT 1.2  --   --   ALKPHOS 82  --   --   ALT 19  --   --   AST 24  --   --   GLUCOSE 171*  < > 189*  < > = values in this interval not displayed.  Lab Results  Component Value Date   TROPONINI <0.30 09/27/2013   Lipid Panel     Component Value Date/Time   CHOL 179 09/29/2013 0458   TRIG 109 09/29/2013 0458   HDL 31* 09/29/2013 0458   CHOLHDL 5.8 09/29/2013 0458   VLDL 22 09/29/2013 0458   LDLCALC 126* 09/29/2013 0458    Pro B Natriuretic peptide (BNP)  Date/Time Value Range Status  10/01/2013 12:03 AM 7371.0* 0 - 450  pg/mL Final  09/30/2013  9:41 AM 7713.0* 0 - 450 pg/mL Final    Recent Labs  09/29/13 0458  INR 1.31      Radiology: Dg Chest 2 View 09/30/2013   CLINICAL DATA:  Shortness of breath  EXAM: CHEST  2 VIEW  COMPARISON:  09/26/2013  FINDINGS: Postsurgical changes are again noted. The cardiac shadow is stable. Persistent left basilar changes are seen. A small right-sided pleural effusion is again noted. Chronic compression deformity is noted at the lower thoracic spine. No acute infiltrate is seen.  IMPRESSION: Changes in the bases bilaterally stable from the prior exam. No acute abnormality is noted.   Electronically Signed   By: Alcide Clever M.D.   On: 09/30/2013 15:55   Dg Chest 2 View 09/26/2013   CLINICAL DATA:  Shortness of breath.  EXAM: CHEST  2 VIEW  COMPARISON:  09/21/2013  FINDINGS: Two views of the chest were obtained. Again noted are bilateral pleural effusions, left side greater than right. Heart size is grossly stable. Slightly increased right basilar densities may represent increased atelectasis or mild dependent edema. Old compression fracture in the lower thoracic spine.  IMPRESSION: Persistent bilateral pleural effusions. Slightly increased right basilar densities could represent atelectasis or mild edema. Minimal change from the recent comparison examination.   Electronically Signed   By: Richarda Overlie M.D.   On: 09/26/2013 08:26    Cardiac Cath:  09/29/2013 Left main: Distal 30% stenosis.  Left Anterior Descending Artery: 100% proximal occlusion. Proximal, mid and distal vessel fills from the patent IMA graft.  Circumflex Artery: Large caliber vessel with large intermediate branch. Diffuse non-obstructive plaque in the large bifurcating intermediate branch. The ostium of the Circumflex has 50% stenosis.  Right Coronary Artery: Large dominant vessel with 100% mid occlusion. The distal vessel fills right to right bridging collaterals and left to right collaterals.  Graft Anatomy:  LIMA  to LAD is patent  Free Radial (? Y graft from LIMA) to OM1, OM2, PDA is occluded.  Left Ventricular Angiogram: Deferred.  Impression:  1. Triple vessel CAD with patent LIMA to LAD, occluded RCA with good collateral filling, moderate disease in Circumflex  2. No focal targets for PCI  3. Ischemic cardiomyopathy with acute systolic CHF  EKG: 09/28/2013 Sinus rhythm Vent. rate 62 BPM PR interval 200 ms QRS duration 128 ms QT/QTc 452/458 ms P-R-T axes 76 -54 92  Echo: 09/27/2013 Conclusions - Study data: Technically adequate study - Left ventricle: The cavity size was normal. Wall thickness was increased in a pattern of moderate LVH. Systolic function was severely reduced. The estimated ejection fraction was in the range of 15% to 20%. Severe diffuse hypokinesis. Doppler parameters are consistent with restrictive physiology,  indicative of decreased left ventricular diastolic compliance and/or increased left atrial pressure. - Regional wall motion abnormality: Akinesis of the mid anterior and mid anteroseptal myocardium. - Aortic valve: Mildly to moderately calcified annulus. Probably trileaflet; moderately thickened leaflets. Valve area: 1.79cm^2(VTI). Valve area: 1.72cm^2 (Vmax). - Mitral valve: Calcified annulus. Mildly thickened leaflets . Moderateregurgitation. The MR vena contracta is 0.4 cm. - Left atrium: The atrium was severely dilated. - Right ventricle: The cavity size was mildly dilated. Systolic function was severely reduced. RV TAPSE is 0.7 cm. - Right atrium: The atrium was severely dilated. - Pulmonary arteries: Systolic pressure was moderately increased. PA peak pressure: 61mm Hg (S).   FOLLOW UP PLANS AND APPOINTMENTS No Known Allergies   Medication List         albuterol 108 (90 BASE) MCG/ACT inhaler  Commonly known as:  PROVENTIL HFA;VENTOLIN HFA  Inhale 1-2 puffs into the lungs every 6 (six) hours as needed for wheezing.     aspirin 81 MG tablet    Take 1 tablet (81 mg total) by mouth daily.     carvedilol 12.5 MG tablet  Commonly known as:  COREG  Take 1 tablet (12.5 mg total) by mouth 2 (two) times daily with a meal.     ezetimibe 10 MG tablet  Commonly known as:  ZETIA  Take 1 tablet (10 mg total) by mouth daily.     furosemide 20 MG tablet  Commonly known as:  LASIX  Take 3 tablets (60 mg total) by mouth daily.     guaiFENesin 600 MG 12 hr tablet  Commonly known as:  MUCINEX  Take 600 mg by mouth daily.     hydrALAZINE 25 MG tablet  Commonly known as:  APRESOLINE  Take 1 tablet (25 mg total) by mouth 2 (two) times daily.     isosorbide mononitrate 30 MG 24 hr tablet  Commonly known as:  IMDUR  Take 1 tablet (30 mg total) by mouth daily.     levofloxacin 750 MG tablet  Commonly known as:  LEVAQUIN  Take 1 tablet (750 mg total) by mouth every other day.     potassium chloride 10 MEQ tablet  Commonly known as:  K-DUR,KLOR-CON  Take 1 tablet (10 mEq total) by mouth daily.     predniSONE 10 MG tablet  Commonly known as:  DELTASONE  Take 1-3 tablets (10-30 mg total) by mouth daily with breakfast. Take 3 tabs for 3 days, 2 tabs for 3 days and 1 tab for 3 days and stop.        Discharge Orders   Future Appointments Provider Department Dept Phone   10/11/2013 10:20 AM Antoine Poche, MD [facility] [phone]   Future Orders Complete By Expires   Diet - low sodium heart healthy  As directed    Increase activity slowly  As directed      Follow-up Information   Follow up with [name], MD On 10/07/2013.   Specialty:  Family Medicine   Contact information:   [address] [place] [address] [phone]       Follow up with Antoine Poche, MD On 10/11/2013. (at 10:20 am, please arrive a little early for paperwork.)    Specialty:  Cardiology   Contact information:   [address] [place] [address] [phone]       BRING ALL  MEDICATIONS WITH YOU TO FOLLOW UP APPOINTMENTS  Time spent with patient to  include physician time: 42 min Signed: Theodore Demark, PA-C 10/01/2013, 1:44 PM Co-Sign MD

## 2013-10-01 NOTE — Progress Notes (Signed)
Subjective: Breathing good  No CP  Walked halls yesterday  Did ok Objective: Filed Vitals:   09/30/13 1752 09/30/13 1944 10/01/13 0452 10/01/13 0801  BP: 142/68 129/76 151/67 163/70  Pulse: 76 78 63 57  Temp:  98.2 F (36.8 C) 97.1 F (36.2 C)   TempSrc:  Oral Oral   Resp:  18 18   Height:      Weight:      SpO2:  93% 91%    Weight change:   Intake/Output Summary (Last 24 hours) at 10/01/13 0804 Last data filed at 10/01/13 0454  Gross per 24 hour  Intake    600 ml  Output   1175 ml  Net   -575 ml    General: Alert, awake, oriented x3, in no acute distress Neck:  JVP is normal Heart: Regular rate and rhythm, without murmurs, rubs, gallops.  Lungs: Clear to auscultation.  No rales or wheezes. Exemities:  No edema.   Neuro: Grossly intact, nonfocal.  SR/SB Lab Results: Results for orders placed during the hospital encounter of 09/26/13 (from the past 24 hour(s))  BASIC METABOLIC PANEL     Status: Abnormal   Collection Time    09/30/13  9:41 AM      Result Value Range   Sodium 143  135 - 145 mEq/L   Potassium 3.6  3.5 - 5.1 mEq/L   Chloride 100  96 - 112 mEq/L   CO2 34 (*) 19 - 32 mEq/L   Glucose, Bld 185 (*) 70 - 99 mg/dL   BUN 23  6 - 23 mg/dL   Creatinine, Ser 4.69  0.50 - 1.35 mg/dL   Calcium 62.9  8.4 - 52.8 mg/dL   GFR calc non Af Amer 51 (*) >90 mL/min   GFR calc Af Amer 60 (*) >90 mL/min  PRO B NATRIURETIC PEPTIDE     Status: Abnormal   Collection Time    09/30/13  9:41 AM      Result Value Range   Pro B Natriuretic peptide (BNP) 7713.0 (*) 0 - 450 pg/mL  BASIC METABOLIC PANEL     Status: Abnormal   Collection Time    09/30/13  2:19 PM      Result Value Range   Sodium 142  135 - 145 mEq/L   Potassium 3.8  3.5 - 5.1 mEq/L   Chloride 99  96 - 112 mEq/L   CO2 33 (*) 19 - 32 mEq/L   Glucose, Bld 189 (*) 70 - 99 mg/dL   BUN 27 (*) 6 - 23 mg/dL   Creatinine, Ser 4.13 (*) 0.50 - 1.35 mg/dL   Calcium 24.4  8.4 - 01.0 mg/dL   GFR calc non Af Amer 45 (*)  >90 mL/min   GFR calc Af Amer 52 (*) >90 mL/min  PRO B NATRIURETIC PEPTIDE     Status: Abnormal   Collection Time    10/01/13 12:03 AM      Result Value Range   Pro B Natriuretic peptide (BNP) 7371.0 (*) 0 - 450 pg/mL    Studies/Results: @RISRSLT24 @  Medications: Reviewed   @PROBHOS   1.  Acute on chronic systolic CHF  Volume status is improved from admit  BNP is still elevated but clinicially he is doing well  OK to d/c  Would arrange f/u for 1 wk from [place] or Tues in [place]  Needs to est with [facility] or [facility]  2.  CAD  Cath shows disease not amenable to intervention  Continue medical Rx  3.  HL  Patient refuses statin  Have started Zetia  Follow as outpatient  Refuses statin.  4.  CKD  Will need to follow as outpatient.    5.  COPD  Placed on Prednisone and Levaquin at [facility] for URI  Would finish course Levaquin for 7 days.  Would taper prednisone.    6.  HTN  Will need to be followed as outpatinetn   LOS: 5 days   Dietrich Pates 10/01/2013, 8:04 AM

## 2013-10-11 ENCOUNTER — Encounter: Payer: Self-pay | Admitting: Cardiology

## 2013-10-11 ENCOUNTER — Ambulatory Visit (INDEPENDENT_AMBULATORY_CARE_PROVIDER_SITE_OTHER): Payer: Medicare Other | Admitting: Cardiology

## 2013-10-11 VITALS — BP 116/55 | HR 72 | Ht 71.0 in | Wt 179.0 lb

## 2013-10-11 DIAGNOSIS — R0989 Other specified symptoms and signs involving the circulatory and respiratory systems: Secondary | ICD-10-CM

## 2013-10-11 DIAGNOSIS — I255 Ischemic cardiomyopathy: Secondary | ICD-10-CM

## 2013-10-11 DIAGNOSIS — I2589 Other forms of chronic ischemic heart disease: Secondary | ICD-10-CM

## 2013-10-11 DIAGNOSIS — I2510 Atherosclerotic heart disease of native coronary artery without angina pectoris: Secondary | ICD-10-CM

## 2013-10-11 MED ORDER — CARVEDILOL 12.5 MG PO TABS: 18.7500 mg | ORAL_TABLET | Freq: Two times a day (BID) | ORAL | Status: DC

## 2013-10-11 NOTE — Progress Notes (Signed)
Clinical Summary [name] is a 79 y.o.male  1. CAD/ICM - history of prior CABG 2003 in [place] - recent admission to [facility] with decompensated heart failure. Workup included an echo which showed an LVEF of 15%. He was transferred to [facility] for further management.  - cath showed LM 30%, LAD 100% proximal, LCX 50% ostial, RCA 100% mid with distal vessel filling with right to right and left to right collaterals. LIMA-LAD patent, graft from LIMI to OM1, OM2, PDA occluded. No PCI targets, recommendations for medical management.  RHC mean PA 43, wedge 22, CI 1.74. - management was affected by poor renal function, he was carefully diuresed. ACE-I/ARB and spironolactone were not started due to renal function.   - no chest pain. No DOE or SOB. Fairly sedentary lifestyle, tolerating his daily activities. No orthopnea, no PND, no edema - compliant with low salt, avoiding NSAIDs - compliant with meds: ASA, coreg, zetia (statin allergy), lasix, hydral/imdur   2. Hyperlipidemia - compliant with zetia - lipitor and crestor have caused prior muscle aches, not on statin    Past Medical History  Diagnosis Date  . Stroke   . Coronary artery disease   . Hypertension   . Pneumonia   . PAD (peripheral artery disease)      No Known Allergies   Current Outpatient Prescriptions  Medication Sig Dispense Refill  . albuterol (PROVENTIL HFA;VENTOLIN HFA) 108 (90 BASE) MCG/ACT inhaler Inhale 1-2 puffs into the lungs every 6 (six) hours as needed for wheezing.  1 Inhaler  0  . aspirin 81 MG tablet Take 1 tablet (81 mg total) by mouth daily.      . carvedilol (COREG) 12.5 MG tablet Take 1 tablet (12.5 mg total) by mouth 2 (two) times daily with a meal.  60 tablet  11  . ezetimibe (ZETIA) 10 MG tablet Take 1 tablet (10 mg total) by mouth daily.  30 tablet  11  . furosemide (LASIX) 20 MG tablet Take 3 tablets (60 mg total) by mouth daily.  90 tablet  11  . guaiFENesin (MUCINEX) 600 MG 12  hr tablet Take 600 mg by mouth daily.      . hydrALAZINE (APRESOLINE) 25 MG tablet Take 1 tablet (25 mg total) by mouth 2 (two) times daily.  60 tablet  11  . isosorbide mononitrate (IMDUR) 30 MG 24 hr tablet Take 1 tablet (30 mg total) by mouth daily.  30 tablet  11  . levofloxacin (LEVAQUIN) 750 MG tablet Take 1 tablet (750 mg total) by mouth every other day.  5 tablet  0  . potassium chloride (K-DUR,KLOR-CON) 10 MEQ tablet Take 1 tablet (10 mEq total) by mouth daily.  30 tablet  11  . predniSONE (DELTASONE) 10 MG tablet Take 1-3 tablets (10-30 mg total) by mouth daily with breakfast. Take 3 tabs for 3 days, 2 tabs for 3 days and 1 tab for 3 days and stop.  18 tablet  0   No current facility-administered medications for this visit.     Past Surgical History  Procedure Laterality Date  . Coronary artery bypass graft    . Knee arthroscopy    . Trigger finger release Bilateral   . Stent to right femoral artery    . Angioplasty to left femoral artery       No Known Allergies    Family History  Problem Relation Age of Onset  . Heart attack Father   . Diabetes Mother  Social History [name] reports that he has been smoking Cigars.  He does not have any smokeless tobacco history on file. [name] reports that he does not drink alcohol.   Review of Systems CONSTITUTIONAL: No weight loss, fever, chills, weakness or fatigue.  HEENT: Eyes: No visual loss, blurred vision, double vision or yellow sclerae.No hearing loss, sneezing, congestion, runny nose or sore throat.  SKIN: No rash or itching.  CARDIOVASCULAR: per HPI RESPIRATORY: per HPI.  GASTROINTESTINAL: No anorexia, nausea, vomiting or diarrhea. No abdominal pain or blood.  GENITOURINARY: No burning on urination, no polyuria NEUROLOGICAL: No headache, dizziness, syncope, paralysis, ataxia, numbness or tingling in the extremities. No change in bowel or bladder control.  MUSCULOSKELETAL: No muscle, back pain, joint  pain or stiffness.  LYMPHATICS: No enlarged nodes. No history of splenectomy.  PSYCHIATRIC: No history of depression or anxiety.  ENDOCRINOLOGIC: No reports of sweating, cold or heat intolerance. No polyuria or polydipsia.  Marland Kitchen   Physical Examination p 72 bp 116/55 Wt 179 lbs BMI 25 Gen: resting comfortably, no acute distress HEENT: no scleral icterus, pupils equal round and reactive, no palptable cervical adenopathy,  CV: RRR, no m/r/g, no JVD, bilateral carotid bruits. Resp: Clear to auscultation bilaterally GI: abdomen is soft, non-tender, non-distended, normal bowel sounds, no hepatosplenomegaly MSK: extremities are warm, no edema.  Skin: warm, no rash Neuro:  no focal deficits Psych: appropriate affect   Diagnostic Studies 09/29/13 Cath Hemodynamic Findings:  Ao: 162/70  LV: 163/19/29  RA: 12  RV: 66/7/16  PA: 67/26 (mean 43)  PCWP: 22  Fick Cardiac Output: 3.5 L/min  Fick Cardiac Index: 1.74 L/min/m2  Central Aortic Saturation: 91%  Pulmonary Artery Saturation: 50%  Angiographic Findings:  Left main: Distal 30% stenosis.  Left Anterior Descending Artery: 100% proximal occlusion. Proximal, mid and distal vessel fills from the patent IMA graft.  Circumflex Artery: Large caliber vessel with large intermediate Sadeen Wiegel. Diffuse non-obstructive plaque in the large bifurcating intermediate Shakeitha Umbaugh. The ostium of the Circumflex has 50% stenosis.  Right Coronary Artery: Large dominant vessel with 100% mid occlusion. The distal vessel fills right to right bridging collaterals and left to right collaterals.  Graft Anatomy:  LIMA to LAD is patent  Free Radial (? Y graft from LIMA) to OM1, OM2, PDA is occluded.  Left Ventricular Angiogram: Deferred.  Impression:  1. Triple vessel CAD with patent LIMA to LAD, occluded RCA with good collateral filling, moderate disease in Circumflex  2. No focal targets for PCI  3. Ischemic cardiomyopathy with acute systolic CHF   09/27/13 Echo LVEF  15-20%, moderate LVH, severe diffuse hypokinesis, restrictive diastolic dysfunction, multiple WMAs, moderate MR, severe RV dysfunction RV TAPSE 0.7 cm, PASP 61 mmHg.   Assessment and Plan  1. CAD/ICM - severe LV systolic dysfunction, he is LVEF 15-20%, he does not have an ICD as we are working to optimize his medications. Last cath described above, severe native disease with patent LIMA-LAD and occluded radial graft. - will increase coreg to 18.75mg  bid, cautious titration due to low normal blood pressure. Counseled on side effects to monitor for and if experienced to cut dose back down to 12.5mg  bid - he is not on ACE-I/ARB or spironolactone due to poor renal function. Will continue hydral and imdur.   2. Hyperlipidemia - prior myalgias on crestor and lipitor before, currently on zetia. Discussed trying low dose pravastatin as this is one of the better tolerated statins, however patient does not want to try at this  time. -continue current therapy  3. Bilateral carotid bruit - will check carotid US - no specific neurological symptoms at this time, complains of occasional left hand numbness that's non-specific.      Antoine Poche, M.D., F.A.C.C.

## 2013-10-11 NOTE — Patient Instructions (Signed)
Your physician recommends that you schedule a follow-up appointment in: 1 month with Dr. Wyline Mood. This appointment will be scheduled today before you leave.  Your physician has recommended you make the following change in your medication:  Increase Carvedilol 18.75 MG twice daily. (1 1/2 tablets twice daily)   Your physician has requested that you have a carotid duplex. This test is an ultrasound of the carotid arteries in your neck. It looks at blood flow through these arteries that supply the brain with blood. Allow one hour for this exam. There are no restrictions or special instructions.

## 2013-10-20 ENCOUNTER — Encounter (INDEPENDENT_AMBULATORY_CARE_PROVIDER_SITE_OTHER): Payer: Medicare Other

## 2013-10-20 DIAGNOSIS — I6529 Occlusion and stenosis of unspecified carotid artery: Secondary | ICD-10-CM

## 2013-10-20 DIAGNOSIS — R0989 Other specified symptoms and signs involving the circulatory and respiratory systems: Secondary | ICD-10-CM

## 2013-11-02 ENCOUNTER — Telehealth: Payer: Self-pay | Admitting: Cardiology

## 2013-11-02 NOTE — Telephone Encounter (Signed)
Informed pt of results and pt verbalized understanding. 

## 2013-11-02 NOTE — Telephone Encounter (Signed)
Message copied by Burnice Logan on Tue Nov 02, 2013 12:11 PM ------      Message from: [place] F      Created: Mon Nov 01, 2013  4:35 PM       Please let patient know there is only mild blockages in the arteries of the neck, we will continue to monitor them. Nothing to do for now.                   Dina Rich MD ------

## 2013-11-11 ENCOUNTER — Ambulatory Visit (INDEPENDENT_AMBULATORY_CARE_PROVIDER_SITE_OTHER): Payer: Medicare Other | Admitting: Cardiology

## 2013-11-11 ENCOUNTER — Encounter: Payer: Self-pay | Admitting: Cardiology

## 2013-11-11 ENCOUNTER — Other Ambulatory Visit: Payer: Self-pay | Admitting: Cardiology

## 2013-11-11 VITALS — BP 112/55 | HR 65 | Ht 71.0 in | Wt 183.1 lb

## 2013-11-11 DIAGNOSIS — I255 Ischemic cardiomyopathy: Secondary | ICD-10-CM

## 2013-11-11 DIAGNOSIS — I2589 Other forms of chronic ischemic heart disease: Secondary | ICD-10-CM

## 2013-11-11 MED ORDER — FUROSEMIDE 20 MG PO TABS: 60.0000 mg | ORAL_TABLET | Freq: Every day | ORAL | Status: DC

## 2013-11-11 MED ORDER — CARVEDILOL 25 MG PO TABS: 25.0000 mg | ORAL_TABLET | Freq: Two times a day (BID) | ORAL | Status: DC

## 2013-11-11 NOTE — Patient Instructions (Signed)
Your physician recommends that you schedule a follow-up appointment in: 6 weeks with Dr. Wyline Mood. This appointment will be scheduled today before you leave.  Your physician has recommended you make the following change in your medication:  Increase Carvedilol 25 MG twice daily  Continue all other medications the same.

## 2013-11-11 NOTE — Progress Notes (Signed)
Clinical Summary Gary Odonnell is a 79 y.o.male seen today for follow up of the following medical problems. This is a focused follow up on his CAD/ischemic cardiomyopathy.   1. CAD/ICM  - history of prior CABG 2003 in [place]  - recent admission to [facility] with decompensated heart failure. Workup included an echo which showed an LVEF of 15%. He was transferred to [facility] for further management.  - cath showed LM 30%, LAD 100% proximal, LCX 50% ostial, RCA 100% mid with distal vessel filling with right to right and left to right collaterals. LIMA-LAD patent, graft from LIMI to OM1, OM2, PDA occluded. No PCI targets, recommendations for medical management. RHC mean PA 43, wedge 22, CI 1.74.  - management was affected by poor renal function, he was carefully diuresed. ACE-I/ARB and spironolactone were not started due to renal function.   - since last visit denies SOB or DOE. No orthopnea, no LE edema. No chest pain.  - lasts visit increased coreg to 18.75 mg bid which he has tolerated without significant side effects.         Past Medical History  Diagnosis Date  . Stroke   . Coronary artery disease   . Hypertension   . Pneumonia   . PAD (peripheral artery disease)      No Known Allergies   Current Outpatient Prescriptions  Medication Sig Dispense Refill  . albuterol (PROVENTIL HFA;VENTOLIN HFA) 108 (90 BASE) MCG/ACT inhaler Inhale 1-2 puffs into the lungs every 6 (six) hours as needed for wheezing.  1 Inhaler  0  . aspirin 81 MG tablet Take 1 tablet (81 mg total) by mouth daily.      . carvedilol (COREG) 12.5 MG tablet Take 1.5 tablets (18.75 mg total) by mouth 2 (two) times daily with a meal.  90 tablet  6  . ezetimibe (ZETIA) 10 MG tablet Take 1 tablet (10 mg total) by mouth daily.  30 tablet  11  . furosemide (LASIX) 20 MG tablet Take 3 tablets (60 mg total) by mouth daily.  90 tablet  11  . guaiFENesin (MUCINEX) 600 MG 12 hr tablet Take 600 mg by mouth 2 (two)  times daily as needed.       . hydrALAZINE (APRESOLINE) 25 MG tablet Take 1 tablet (25 mg total) by mouth 2 (two) times daily.  60 tablet  11  . isosorbide mononitrate (IMDUR) 30 MG 24 hr tablet Take 1 tablet (30 mg total) by mouth daily.  30 tablet  11  . potassium chloride (K-DUR,KLOR-CON) 10 MEQ tablet Take 1 tablet (10 mEq total) by mouth daily.  30 tablet  11   No current facility-administered medications for this visit.     Past Surgical History  Procedure Laterality Date  . Coronary artery bypass graft    . Knee arthroscopy    . Trigger finger release Bilateral   . Stent to right femoral artery    . Angioplasty to left femoral artery       No Known Allergies    Family History  Problem Relation Age of Onset  . Heart attack Father   . Diabetes Mother      Social History Gary Odonnell reports that he has been smoking Cigars.  He does not have any smokeless tobacco history on file. Gary Odonnell reports that he does not drink alcohol.   Review of Systems CONSTITUTIONAL: No weight loss, fever, chills, weakness or fatigue.  HEENT: Eyes: No visual loss,  blurred vision, double vision or yellow sclerae.No hearing loss, sneezing, congestion, runny nose or sore throat.  SKIN: No rash or itching.  CARDIOVASCULAR: per HPI RESPIRATORY: No shortness of breath, cough or sputum.  GASTROINTESTINAL: No anorexia, nausea, vomiting or diarrhea. No abdominal pain or blood.  GENITOURINARY: No burning on urination, no polyuria NEUROLOGICAL: No headache, dizziness, syncope, paralysis, ataxia, numbness or tingling in the extremities. No change in bowel or bladder control.  MUSCULOSKELETAL: No muscle, back pain, joint pain or stiffness.  LYMPHATICS: No enlarged nodes. No history of splenectomy.  PSYCHIATRIC: No history of depression or anxiety.  ENDOCRINOLOGIC: No reports of sweating, cold or heat intolerance. No polyuria or polydipsia.  Marland Kitchen   Physical Examination p 65 bp 112/55 Wt 183 lbs BMI  26 Gen: resting comfortably, no acute distress HEENT: no scleral icterus, pupils equal round and reactive, no palptable cervical adenopathy,  CV: RRR, no m/r/g, no JVD, mild bilateral carotid bruits Resp: Clear to auscultation bilaterally GI: abdomen is soft, non-tender, non-distended, normal bowel sounds, no hepatosplenomegaly MSK: extremities are warm, no edema.  Skin: warm, no rash Neuro:  no focal deficits Psych: appropriate affect   Diagnostic Studies 09/29/13 Cath  Hemodynamic Findings:  Ao: 162/70  LV: 163/19/29  RA: 12  RV: 66/7/16  PA: 67/26 (mean 43)  PCWP: 22  Fick Cardiac Output: 3.5 L/min  Fick Cardiac Index: 1.74 L/min/m2  Central Aortic Saturation: 91%  Pulmonary Artery Saturation: 50%  Angiographic Findings:  Left main: Distal 30% stenosis.  Left Anterior Descending Artery: 100% proximal occlusion. Proximal, mid and distal vessel fills from the patent IMA graft.  Circumflex Artery: Large caliber vessel with large intermediate Gary Odonnell. Diffuse non-obstructive plaque in the large bifurcating intermediate Gary Odonnell. The ostium of the Circumflex has 50% stenosis.  Right Coronary Artery: Large dominant vessel with 100% mid occlusion. The distal vessel fills right to right bridging collaterals and left to right collaterals.  Graft Anatomy:  LIMA to LAD is patent  Free Radial (? Y graft from LIMA) to OM1, OM2, PDA is occluded.  Left Ventricular Angiogram: Deferred.  Impression:  1. Triple vessel CAD with patent LIMA to LAD, occluded RCA with good collateral filling, moderate disease in Circumflex  2. No focal targets for PCI  3. Ischemic cardiomyopathy with acute systolic CHF  09/27/13 Echo  LVEF 15-20%, moderate LVH, severe diffuse hypokinesis, restrictive diastolic dysfunction, multiple WMAs, moderate MR, severe RV dysfunction RV TAPSE 0.7 cm, PASP 61 mmHg.    10/22/13 Carotid US Mild 1-39% stenosis  Assessment and Plan  1. CAD/ICM  - severe LV systolic  dysfunction, he is LVEF 15-20%, he does not have an ICD as we are working to optimize his medications. Last cath described above, severe native disease with patent LIMA-LAD and occluded radial graft.  - will increase coreg to 25 mg bid, cautious titration due to low normal blood pressure. Counseled on side effects to monitor for and if experienced to cut dose back down  - he is not on ACE-I/ARB or spironolactone due to poor renal function. Will continue hydral and imdur at current doses, potential further titration at follow up.   2. Bilateral carotid bruit  - no neurological symptoms - no significant disease on recent US   F/u 6 weeks for further medication titration in setting of systolic heart failure    Antoine Poche, M.D., F.A.C.C.

## 2013-12-09 ENCOUNTER — Other Ambulatory Visit (HOSPITAL_COMMUNITY): Payer: Self-pay | Admitting: Family Medicine

## 2013-12-09 DIAGNOSIS — Z09 Encounter for follow-up examination after completed treatment for conditions other than malignant neoplasm: Secondary | ICD-10-CM

## 2013-12-09 DIAGNOSIS — R222 Localized swelling, mass and lump, trunk: Secondary | ICD-10-CM

## 2013-12-13 ENCOUNTER — Ambulatory Visit (HOSPITAL_COMMUNITY)
Admission: RE | Admit: 2013-12-13 | Discharge: 2013-12-13 | Disposition: A | Discharge status: Home / Self Care | Payer: Medicare Other | Source: Ambulatory Visit | Attending: Family Medicine | Admitting: Family Medicine

## 2013-12-13 DIAGNOSIS — R222 Localized swelling, mass and lump, trunk: Secondary | ICD-10-CM

## 2013-12-13 DIAGNOSIS — Z09 Encounter for follow-up examination after completed treatment for conditions other than malignant neoplasm: Secondary | ICD-10-CM | POA: Insufficient documentation

## 2013-12-13 DIAGNOSIS — R911 Solitary pulmonary nodule: Secondary | ICD-10-CM | POA: Insufficient documentation

## 2013-12-30 ENCOUNTER — Ambulatory Visit (INDEPENDENT_AMBULATORY_CARE_PROVIDER_SITE_OTHER): Payer: Medicare Other | Admitting: Cardiology

## 2013-12-30 ENCOUNTER — Encounter: Payer: Self-pay | Admitting: Cardiology

## 2013-12-30 VITALS — BP 102/67 | HR 97 | Ht 71.0 in | Wt 186.0 lb

## 2013-12-30 DIAGNOSIS — I2589 Other forms of chronic ischemic heart disease: Secondary | ICD-10-CM

## 2013-12-30 DIAGNOSIS — I2510 Atherosclerotic heart disease of native coronary artery without angina pectoris: Secondary | ICD-10-CM

## 2013-12-30 DIAGNOSIS — I255 Ischemic cardiomyopathy: Secondary | ICD-10-CM

## 2013-12-30 NOTE — Patient Instructions (Signed)
Continue all current medications. Follow up in  1 month

## 2013-12-30 NOTE — Progress Notes (Signed)
Clinical Summary [name] is a 80 y.o.male seen today for follow up of the following medical problems. This is a focused follow up on his CAD/ischemic cardiomyopathy.   1. CAD/ICM  - history of prior CABG 2003 in [place]  - recent admission to [facility] with decompensated heart failure. Workup included an echo which showed an LVEF of 15%. He was transferred to [facility] for further management.  - cath showed LM 30%, LAD 100% proximal, LCX 50% ostial, RCA 100% mid with distal vessel filling with right to right and left to right collaterals. LIMA-LAD patent, graft from [place] to OM1, OM2, PDA occluded. No PCI targets, recommendations for medical management. RHC mean PA 43, wedge 22, CI 1.74.  - management has been affected by poor renal function, ACE-I/ARB and spironolactone have not been initiated.  Statins have caused myalgias in the past.   - since last visit denies SOB or DOE. No chest pain. No orthopnea, no LE edema. No chest pain.  - lasts visit increased coreg to 25 mg bid, rare lightheadedness/dizziness, but overall tolerating well.   Past Medical History  Diagnosis Date  . Stroke   . Coronary artery disease   . Hypertension   . Pneumonia   . PAD (peripheral artery disease)      No Known Allergies   Current Outpatient Prescriptions  Medication Sig Dispense Refill  . albuterol (PROVENTIL HFA;VENTOLIN HFA) 108 (90 BASE) MCG/ACT inhaler Inhale 1-2 puffs into the lungs every 6 (six) hours as needed for wheezing.  1 Inhaler  0  . aspirin 81 MG tablet Take 1 tablet (81 mg total) by mouth daily.      . carvedilol (COREG) 25 MG tablet Take 1 tablet (25 mg total) by mouth 2 (two) times daily with a meal.  180 tablet  3  . ezetimibe (ZETIA) 10 MG tablet Take 1 tablet (10 mg total) by mouth daily.  30 tablet  11  . furosemide (LASIX) 20 MG tablet Take 3 tablets (60 mg total) by mouth daily.  270 tablet  3  . hydrALAZINE (APRESOLINE) 25 MG tablet Take 1 tablet (25 mg total)  by mouth 2 (two) times daily.  60 tablet  11  . isosorbide mononitrate (IMDUR) 30 MG 24 hr tablet Take 1 tablet (30 mg total) by mouth daily.  30 tablet  11  . potassium chloride (K-DUR,KLOR-CON) 10 MEQ tablet Take 1 tablet (10 mEq total) by mouth daily.  30 tablet  11   No current facility-administered medications for this visit.     Past Surgical History  Procedure Laterality Date  . Coronary artery bypass graft    . Knee arthroscopy    . Trigger finger release Bilateral   . Stent to right femoral artery    . Angioplasty to left femoral artery       No Known Allergies    Family History  Problem Relation Age of Onset  . Heart attack Father   . Diabetes Mother      Social History Mr. Luffman reports that he has been smoking Cigars.  He does not have any smokeless tobacco history on file. Mr. Bose reports that he does not drink alcohol.   Review of Systems CONSTITUTIONAL: No weight loss, fever, chills, weakness or fatigue.  HEENT: Eyes: No visual loss, blurred vision, double vision or yellow sclerae.No hearing loss, sneezing, congestion, runny nose or sore throat.  SKIN: No rash or itching.  CARDIOVASCULAR: per HPI RESPIRATORY: No  shortness of breath, cough or sputum.  GASTROINTESTINAL: No anorexia, nausea, vomiting or diarrhea. No abdominal pain or blood.  GENITOURINARY: No burning on urination, no polyuria NEUROLOGICAL: No headache, dizziness, syncope, paralysis, ataxia, numbness or tingling in the extremities. No change in bowel or bladder control.  MUSCULOSKELETAL: No muscle, back pain, joint pain or stiffness.  LYMPHATICS: No enlarged nodes. No history of splenectomy.  PSYCHIATRIC: No history of depression or anxiety.  ENDOCRINOLOGIC: No reports of sweating, cold or heat intolerance. No polyuria or polydipsia.  Marland Kitchen   Physical Examination p 97 bp 102/67 Wt 186 lbs BMI 26 Gen: resting comfortably, no acute distress HEENT: no scleral icterus, pupils equal round  and reactive, no palptable cervical adenopathy,  CV: RRR, no m/r/g, no JVD, no carotid bruits Resp: Clear to auscultation bilaterally GI: abdomen is soft, non-tender, non-distended, normal bowel sounds, no hepatosplenomegaly MSK: extremities are warm, no edema.  Skin: warm, no rash Neuro:  no focal deficits Psych: appropriate affect   Diagnostic Studies 09/29/13 Cath  Hemodynamic Findings:  Ao: 162/70  LV: 163/19/29  RA: 12  RV: 66/7/16  PA: 67/26 (mean 43)  PCWP: 22  Fick Cardiac Output: 3.5 L/min  Fick Cardiac Index: 1.74 L/min/m2  Central Aortic Saturation: 91%  Pulmonary Artery Saturation: 50%  Angiographic Findings:  Left main: Distal 30% stenosis.  Left Anterior Descending Artery: 100% proximal occlusion. Proximal, mid and distal vessel fills from the patent IMA graft.  Circumflex Artery: Large caliber vessel with large intermediate Iliyah Bui. Diffuse non-obstructive plaque in the large bifurcating intermediate Hrishikesh Hoeg. The ostium of the Circumflex has 50% stenosis.  Right Coronary Artery: Large dominant vessel with 100% mid occlusion. The distal vessel fills right to right bridging collaterals and left to right collaterals.  Graft Anatomy:  LIMA to LAD is patent  Free Radial (? Y graft from LIMA) to OM1, OM2, PDA is occluded.  Left Ventricular Angiogram: Deferred.  Impression:  1. Triple vessel CAD with patent LIMA to LAD, occluded RCA with good collateral filling, moderate disease in Circumflex  2. No focal targets for PCI  3. Ischemic cardiomyopathy with acute systolic CHF   36/1/44 Echo  LVEF 15-20%, moderate LVH, severe diffuse hypokinesis, restrictive diastolic dysfunction, multiple WMAs, moderate MR, severe RV dysfunction RV TAPSE 0.7 cm, PASP 61 mmHg.      Assessment and Plan   1. CAD/ICM  - severe LV systolic dysfunction, LVEF 15-20%, NYHA II, he does not have an ICD as we are working to optimize his medications. Last cath described above, severe native disease  with patent LIMA-LAD and occluded radial graft.  - will continue current medications, follow up recent BMET, if renal function stable likely initiate low dose ACE-I at next visit.   Follow up 1 month for further medication titration in the setting of systolic heart failuire     Arnoldo Lenis, M.D., F.A.C.C.

## 2014-01-27 ENCOUNTER — Ambulatory Visit (INDEPENDENT_AMBULATORY_CARE_PROVIDER_SITE_OTHER): Payer: Medicare Other | Admitting: Cardiology

## 2014-01-27 ENCOUNTER — Encounter: Payer: Self-pay | Admitting: Cardiology

## 2014-01-27 VITALS — BP 124/54 | HR 48 | Ht 71.0 in | Wt 187.0 lb

## 2014-01-27 DIAGNOSIS — I2589 Other forms of chronic ischemic heart disease: Secondary | ICD-10-CM

## 2014-01-27 DIAGNOSIS — I255 Ischemic cardiomyopathy: Secondary | ICD-10-CM

## 2014-01-27 DIAGNOSIS — I10 Essential (primary) hypertension: Secondary | ICD-10-CM

## 2014-01-27 DIAGNOSIS — E785 Hyperlipidemia, unspecified: Secondary | ICD-10-CM

## 2014-01-27 DIAGNOSIS — I2510 Atherosclerotic heart disease of native coronary artery without angina pectoris: Secondary | ICD-10-CM

## 2014-01-27 MED ORDER — LISINOPRIL 5 MG PO TABS: 5.0000 mg | ORAL_TABLET | Freq: Every day | ORAL | Status: DC

## 2014-01-27 NOTE — Progress Notes (Signed)
Clinical Summary Mr. Colee is a 80 y.o.male seen today for follow up of the following medical problems.   1. CAD/ICM  - history of prior CABG 2003 in [place]  - recent admission to [facility] with decompensated heart failure. Workup included an echo which showed an LVEF of 15%. He was transferred to [facility] for further management.  - cath showed LM 30%, LAD 100% proximal, LCX 50% ostial, RCA 100% mid with distal vessel filling with right to right and left to right collaterals. LIMA-LAD patent, graft from [place] to OM1, OM2, PDA occluded. No PCI targets, recommendations for medical management. RHC mean PA 43, wedge 22, CI 1.74.  - management has been affected by poor renal function, ACE-I/ARB and spironolactone have not been initiated. Statins have caused myalgias in the past.  - last visit increased coreg to 25 mg bid, rare lightheadedness/dizziness, but overall tolerating well.   - denies any chest pain or SOB - repeat labs not done yet - low heart rate today, bud denies any symptoms.   2. Hyperlipidemia  - compliant with zetia  - lipitor and crestor have caused prior muscle aches, not on statin  3. Hypothyroid - reports PCP wanted to start medications but he is hesitant   Past Medical History  Diagnosis Date  . Stroke   . Coronary artery disease   . Hypertension   . Pneumonia   . PAD (peripheral artery disease)      No Known Allergies   Current Outpatient Prescriptions  Medication Sig Dispense Refill  . albuterol (PROVENTIL HFA;VENTOLIN HFA) 108 (90 BASE) MCG/ACT inhaler Inhale 1-2 puffs into the lungs every 6 (six) hours as needed for wheezing.  1 Inhaler  0  . aspirin 81 MG tablet Take 1 tablet (81 mg total) by mouth daily.      . carvedilol (COREG) 25 MG tablet Take 1 tablet (25 mg total) by mouth 2 (two) times daily with a meal.  180 tablet  3  . ezetimibe (ZETIA) 10 MG tablet Take 1 tablet (10 mg total) by mouth daily.  30 tablet  11  . furosemide  (LASIX) 20 MG tablet Take 3 tablets (60 mg total) by mouth daily.  270 tablet  3  . hydrALAZINE (APRESOLINE) 25 MG tablet Take 25 mg by mouth daily.      . isosorbide mononitrate (IMDUR) 30 MG 24 hr tablet Take 1 tablet (30 mg total) by mouth daily.  30 tablet  11  . potassium chloride (K-DUR,KLOR-CON) 10 MEQ tablet Take 1 tablet (10 mEq total) by mouth daily.  30 tablet  11   No current facility-administered medications for this visit.     Past Surgical History  Procedure Laterality Date  . Coronary artery bypass graft    . Knee arthroscopy    . Trigger finger release Bilateral   . Stent to right femoral artery    . Angioplasty to left femoral artery       No Known Allergies    Family History  Problem Relation Age of Onset  . Heart attack Father   . Diabetes Mother      Social History Mr. Greenawalt reports that he has been smoking Cigars.  He has never used smokeless tobacco. Mr. Neis reports that he does not drink alcohol.   Review of Systems CONSTITUTIONAL: No weight loss, fever, chills, weakness or fatigue.  HEENT: Eyes: No visual loss, blurred vision, double vision or yellow sclerae.No hearing loss, sneezing, congestion,  runny nose or sore throat.  SKIN: No rash or itching.  CARDIOVASCULAR: per HPI RESPIRATORY: No shortness of breath, cough or sputum.  GASTROINTESTINAL: No anorexia, nausea, vomiting or diarrhea. No abdominal pain or blood.  GENITOURINARY: No burning on urination, no polyuria NEUROLOGICAL: No headache, dizziness, syncope, paralysis, ataxia, numbness or tingling in the extremities. No change in bowel or bladder control.  MUSCULOSKELETAL: No muscle, back pain, joint pain or stiffness.  LYMPHATICS: No enlarged nodes. No history of splenectomy.  PSYCHIATRIC: No history of depression or anxiety.  ENDOCRINOLOGIC: No reports of sweating, cold or heat intolerance. No polyuria or polydipsia.  Marland Kitchen   Physical Examination p 48 bp 124/54 Wt 187 lbs BMI  26 Gen: resting comfortably, no acute distress HEENT: no scleral icterus, pupils equal round and reactive, no palptable cervical adenopathy,  CV: RRR, no m/r/g, no JVD, no carotid bruits Resp: Clear to auscultation bilaterally GI: abdomen is soft, non-tender, non-distended, normal bowel sounds, no hepatosplenomegaly MSK: extremities are warm, no edema.  Skin: warm, no rash Neuro:  no focal deficits Psych: appropriate affect   Diagnostic Studies 09/29/13 Cath  Hemodynamic Findings:  Ao: 162/70  LV: 163/19/29  RA: 12  RV: 66/7/16  PA: 67/26 (mean 43)  PCWP: 22  Fick Cardiac Output: 3.5 L/min  Fick Cardiac Index: 1.74 L/min/m2  Central Aortic Saturation: 91%  Pulmonary Artery Saturation: 50%  Angiographic Findings:  Left main: Distal 30% stenosis.  Left Anterior Descending Artery: 100% proximal occlusion. Proximal, mid and distal vessel fills from the patent IMA graft.  Circumflex Artery: Large caliber vessel with large intermediate branch. Diffuse non-obstructive plaque in the large bifurcating intermediate branch. The ostium of the Circumflex has 50% stenosis.  Right Coronary Artery: Large dominant vessel with 100% mid occlusion. The distal vessel fills right to right bridging collaterals and left to right collaterals.  Graft Anatomy:  LIMA to LAD is patent  Free Radial (? Y graft from LIMA) to OM1, OM2, PDA is occluded.  Left Ventricular Angiogram: Deferred.  Impression:  1. Triple vessel CAD with patent LIMA to LAD, occluded RCA with good collateral filling, moderate disease in Circumflex  2. No focal targets for PCI  3. Ischemic cardiomyopathy with acute systolic CHF   89/3/81 Echo  LVEF 15-20%, moderate LVH, severe diffuse hypokinesis, restrictive diastolic dysfunction, multiple WMAs, moderate MR, severe RV dysfunction RV TAPSE 0.7 cm, PASP 61 mmHg.    12/2013 Lab TC 175 TG 111 LDL 116 HDL 37  TSH 7.7 HgbA1c 6.6 Na 143 K 4.5 Cr 1.22 GFR56   Assessment and Plan  1.  CAD/ICM  - severe LV systolic dysfunction, LVEF 15-20%, NYHA II, he does not have an ICD as we are working to optimize his medications. Last cath described above, severe native disease with patent LIMA-LAD and occluded radial graft.  - renal function remains stable, will stop hydral/imdur to allow room with blood pressure to start higher priority therapy with lisinopril 5mg  daily with repeat BMET in 3 weeks.  - noted low heart rate in clinic today that is asymptomatic, will continue current beta blocker dose at this time. He is also staring tx for low thyroid. Follow closely   2. Hyperlipidemia - intolerant to statins, continue zetia.     Follow up 2 months   Arnoldo Lenis, M.D., F.A.C.C.

## 2014-01-27 NOTE — Patient Instructions (Signed)
Your physician recommends that you schedule a follow-up appointment in: 2 months with Dr. Harl Bowie. This appointment will be scheduled today before you leave.  Your physician has recommended you make the following change in your medication:  Stop: Isosorbide Mononitrate (IMDUR) Stop: Hydralazine  Start: Lisinopril 5 MG take 1 tablet by mouth once daily.  Continue all other medications the same.   Your physician recommends that you return for lab work in: 3 weeks (Around 02-17-2014) for BMET  You can go to the following locations to get lab work done: [facility] [employer] [address] Dr. Edrick Oh office in [facility].

## 2014-01-28 ENCOUNTER — Telehealth: Payer: Self-pay | Admitting: *Deleted

## 2014-01-28 NOTE — Telephone Encounter (Signed)
Quantity changed to #90 3 refills.

## 2014-01-31 ENCOUNTER — Emergency Department (HOSPITAL_COMMUNITY)
Admission: EM | Admit: 2014-01-31 | Discharge: 2014-01-31 | Disposition: A | Discharge status: Home / Self Care | Payer: Medicare Other | Attending: Emergency Medicine | Admitting: Emergency Medicine

## 2014-01-31 ENCOUNTER — Emergency Department (HOSPITAL_COMMUNITY): Payer: Medicare Other

## 2014-01-31 ENCOUNTER — Encounter (HOSPITAL_COMMUNITY): Payer: Self-pay | Admitting: Emergency Medicine

## 2014-01-31 DIAGNOSIS — Z7982 Long term (current) use of aspirin: Secondary | ICD-10-CM | POA: Insufficient documentation

## 2014-01-31 DIAGNOSIS — M79643 Pain in unspecified hand: Secondary | ICD-10-CM

## 2014-01-31 DIAGNOSIS — Z8673 Personal history of transient ischemic attack (TIA), and cerebral infarction without residual deficits: Secondary | ICD-10-CM | POA: Insufficient documentation

## 2014-01-31 DIAGNOSIS — Z8701 Personal history of pneumonia (recurrent): Secondary | ICD-10-CM | POA: Insufficient documentation

## 2014-01-31 DIAGNOSIS — I10 Essential (primary) hypertension: Secondary | ICD-10-CM | POA: Insufficient documentation

## 2014-01-31 DIAGNOSIS — R209 Unspecified disturbances of skin sensation: Secondary | ICD-10-CM | POA: Insufficient documentation

## 2014-01-31 DIAGNOSIS — R609 Edema, unspecified: Secondary | ICD-10-CM | POA: Insufficient documentation

## 2014-01-31 DIAGNOSIS — M79609 Pain in unspecified limb: Secondary | ICD-10-CM | POA: Insufficient documentation

## 2014-01-31 DIAGNOSIS — F17200 Nicotine dependence, unspecified, uncomplicated: Secondary | ICD-10-CM | POA: Insufficient documentation

## 2014-01-31 DIAGNOSIS — Z79899 Other long term (current) drug therapy: Secondary | ICD-10-CM | POA: Insufficient documentation

## 2014-01-31 DIAGNOSIS — I2510 Atherosclerotic heart disease of native coronary artery without angina pectoris: Secondary | ICD-10-CM | POA: Insufficient documentation

## 2014-01-31 DIAGNOSIS — Z951 Presence of aortocoronary bypass graft: Secondary | ICD-10-CM | POA: Insufficient documentation

## 2014-01-31 DIAGNOSIS — M6281 Muscle weakness (generalized): Secondary | ICD-10-CM | POA: Insufficient documentation

## 2014-01-31 MED ORDER — NAPROXEN 500 MG PO TABS: 500.0000 mg | ORAL_TABLET | Freq: Two times a day (BID) | ORAL | Status: DC

## 2014-01-31 NOTE — ED Provider Notes (Signed)
CSN: [id number]     Arrival date & time 01/31/14  4098 History  This chart was scribed for Maudry Diego, MD,  by Stacy Gardner, ED Scribe. The patient was seen in room APA16A/APA16A and the patient's care was started at 10:59 AM.   First MD Initiated Contact with Patient 01/31/14 1052     Chief Complaint  Patient presents with  . Hand Pain     (Consider location/radiation/quality/duration/timing/severity/associated sxs/prior Treatment) Patient is a 80 y.o. male presenting with hand pain. The history is provided by medical records and the patient.  Hand Pain This is a new problem. The current episode started more than 1 week ago. The problem occurs constantly. The problem has been gradually worsening. Pertinent negatives include no chest pain, no abdominal pain and no headaches. The symptoms are aggravated by bending and exertion. Nothing relieves the symptoms.   HPI Comments: Gary Odonnell is a 80 y.o. male who presents to the Emergency Department complaining of left hand pain for the past eight months which has gotten progressively worse over the past four days. Pt mentions that he has been unable grip items with his left hand for the past four days due to pain and weakness.  His left wrist has mild edema. Denies any known injury.  His wrist is painful with flexion and extension. The pain radiates to the palm of his hand and he experiences pain with the movement of his thumb. He denies hx of fractured bones. He has an appointment with a neurologist for numbness to his face and hands for the past seven months.Nothing seems to resolve his symptoms.  Past Medical History  Diagnosis Date  . Stroke   . Coronary artery disease   . Hypertension   . Pneumonia   . PAD (peripheral artery disease)    Past Surgical History  Procedure Laterality Date  . Coronary artery bypass graft    . Knee arthroscopy    . Trigger finger release Bilateral   . Stent to right femoral artery    . Angioplasty  to left femoral artery     Family History  Problem Relation Age of Onset  . Heart attack Father   . Diabetes Mother    History  Substance Use Topics  . Smoking status: Current Every Day Smoker -- 1.00 packs/day for 60 years    Types: Cigars  . Smokeless tobacco: Never Used     Comment: former cigarette smoker/smoked cigarettes for 30 years  . Alcohol Use: No    Review of Systems  Cardiovascular: Negative for chest pain.  Gastrointestinal: Negative for abdominal pain.  Musculoskeletal: Positive for arthralgias and myalgias.  Neurological: Positive for numbness. Negative for headaches.  All other systems reviewed and are negative.      Allergies  Review of patient's allergies indicates no known allergies.  Home Medications   Current Outpatient Rx  Name  Route  Sig  Dispense  Refill  . albuterol (PROVENTIL HFA;VENTOLIN HFA) 108 (90 BASE) MCG/ACT inhaler   Inhalation   Inhale 1-2 puffs into the lungs every 6 (six) hours as needed for wheezing.   1 Inhaler   0   . aspirin 81 MG tablet   Oral   Take 1 tablet (81 mg total) by mouth daily.         . carvedilol (COREG) 25 MG tablet   Oral   Take 1 tablet (25 mg total) by mouth 2 (two) times daily with a meal.   180 tablet  3     Dose Increase 11-11-2013   . ezetimibe (ZETIA) 10 MG tablet   Oral   Take 1 tablet (10 mg total) by mouth daily.   30 tablet   11   . furosemide (LASIX) 20 MG tablet   Oral   Take 3 tablets (60 mg total) by mouth daily.   270 tablet   3   . levothyroxine (SYNTHROID, LEVOTHROID) 25 MCG tablet   Oral   Take 25 mcg by mouth daily.          Marland Kitchen lisinopril (PRINIVIL,ZESTRIL) 5 MG tablet   Oral   Take 1 tablet (5 mg total) by mouth daily.   30 tablet   6     New 01-27-14   . potassium chloride (K-DUR,KLOR-CON) 10 MEQ tablet   Oral   Take 1 tablet (10 mEq total) by mouth daily.   30 tablet   11    BP 141/47  Pulse 62  Temp(Src) 98.6 F (37 C) (Oral)  Resp 20  Ht 6'  (1.829 m)  Wt 180 lb (81.647 kg)  BMI 24.41 kg/m2  SpO2 98% Physical Exam  Nursing note and vitals reviewed. Constitutional: He is oriented to person, place, and time. He appears well-developed.  HENT:  Head: Normocephalic.  Eyes: Conjunctivae are normal.  Neck: No tracheal deviation present.  Cardiovascular:  No murmur heard. Musculoskeletal: He exhibits tenderness.   Pain with flexion and extension of his left wrist Mild weakness with grasping objects.   Neurological: He is oriented to person, place, and time.  Skin: Skin is warm.  Psychiatric: He has a normal mood and affect.    ED Course  Procedures (including critical care time) DIAGNOSTIC STUDIES: Oxygen Saturation is 98% on room air, normal by my interpretation.    COORDINATION OF CARE:  11:04 AM Discussed course of care with pt which includes follow up with a orthopedist and . Pt understands and agrees.   Labs Review Labs Reviewed - No data to display Imaging Review Dg Hand Complete Left  01/31/2014   CLINICAL DATA:  Pain.  Injury July 2014.  EXAM: LEFT HAND - COMPLETE 3+ VIEW  COMPARISON:  07/14/2013  FINDINGS: There are mild degenerative changes over the radiocarpal joint, carpal bones and interphalangeal joints. There is no acute fracture or dislocation. Surgical clips are present over the anterior lateral soft tissues of the wrist. There is a tiny 1.5 mm density within the soft tissues between the fourth and fifth MCP joints unchanged likely foreign body.  IMPRESSION: No acute findings.  Mild degenerative changes as well as surgical clips over the soft tissues of the anterior lateral wrist and 1.5 mm foreign body between the fourth and fifth MCP joints unchanged.   Electronically Signed   By: Marin Olp M.D.   On: 01/31/2014 10:24     EKG Interpretation None      MDM   Final diagnoses:  None    The chart was scribed for me under my direct supervision.  I personally performed the history, physical, and  medical decision making and all procedures in the evaluation of this patient.Maudry Diego, MD 01/31/14 1126

## 2014-01-31 NOTE — ED Notes (Signed)
Pt states his left hand had been numb for a while. Complain of pain when attempting to close his hand. No known injury

## 2014-01-31 NOTE — ED Notes (Signed)
Pt reports injury to left hand a year ago.  Pt reports numbness to his left hand since the injury but states that he started having severe pain to his left hand and wrist last Thursday.

## 2014-01-31 NOTE — Discharge Instructions (Signed)
Follow up with dr. Luna Glasgow the end of this week or next week

## 2014-02-22 ENCOUNTER — Encounter: Payer: Self-pay | Admitting: Cardiology

## 2014-02-25 ENCOUNTER — Telehealth: Payer: Self-pay | Admitting: Cardiology

## 2014-02-25 NOTE — Telephone Encounter (Signed)
Patient states that his is still having a lot of pain in his wrist.  Wants to know what he can take for pain that will not interfere with his other medications.  Also, patient states that he had lab work done on 02/17/14 and would like a copy mailed to him.

## 2014-02-25 NOTE — Telephone Encounter (Signed)
Left message informing pt that tylenol is good for pain and that if pt had any questions to return call at [phone]

## 2014-03-29 ENCOUNTER — Ambulatory Visit (INDEPENDENT_AMBULATORY_CARE_PROVIDER_SITE_OTHER): Payer: Medicare Other | Admitting: Cardiology

## 2014-03-29 ENCOUNTER — Encounter: Payer: Self-pay | Admitting: Cardiology

## 2014-03-29 VITALS — BP 138/64 | HR 60 | Ht 71.0 in | Wt 183.0 lb

## 2014-03-29 DIAGNOSIS — I2510 Atherosclerotic heart disease of native coronary artery without angina pectoris: Secondary | ICD-10-CM

## 2014-03-29 DIAGNOSIS — I509 Heart failure, unspecified: Secondary | ICD-10-CM

## 2014-03-29 DIAGNOSIS — E785 Hyperlipidemia, unspecified: Secondary | ICD-10-CM

## 2014-03-29 NOTE — Progress Notes (Signed)
Clinical Summary Gary Odonnell is a 80 y.o.male seen today for follow up of the following medical problems.   1. CAD/ICM  - history of prior CABG 2003 in [place]  - recent admission to [facility] with decompensated heart failure. Workup included an echo which showed an LVEF of 15%. He was transferred to [facility] for further management.  - cath showed LM 30%, LAD 100% proximal, LCX 50% ostial, RCA 100% mid with distal vessel filling with right to right and left to right collaterals. LIMA-LAD patent, graft from [place] to OM1, OM2, PDA occluded. No PCI targets, recommendations for medical management. RHC mean PA 43, wedge 22, CI 1.74.   - last visit started low dose lisinopril at 5mg  daily. Repeat labs Cr 1.20 and K 4.1 - exertion is limited by leg weakness, denies DOE. No LE edema, no orthopnea, no PND - limiting Na intake, avoiding NSAIDs  2. Hyperlipidemia  - compliant with zetia  - lipitor and crestor have caused prior muscle aches, not on statin      Past Medical History  Diagnosis Date  . Stroke   . Coronary artery disease   . Hypertension   . Pneumonia   . PAD (peripheral artery disease)      No Known Allergies   Current Outpatient Prescriptions  Medication Sig Dispense Refill  . albuterol (PROVENTIL HFA;VENTOLIN HFA) 108 (90 BASE) MCG/ACT inhaler Inhale 1-2 puffs into the lungs every 6 (six) hours as needed for wheezing.  1 Inhaler  0  . aspirin 81 MG tablet Take 1 tablet (81 mg total) by mouth daily.      . carvedilol (COREG) 25 MG tablet Take 1 tablet (25 mg total) by mouth 2 (two) times daily with a meal.  180 tablet  3  . ezetimibe (ZETIA) 10 MG tablet Take 1 tablet (10 mg total) by mouth daily.  30 tablet  11  . furosemide (LASIX) 20 MG tablet Take 3 tablets (60 mg total) by mouth daily.  270 tablet  3  . levothyroxine (SYNTHROID, LEVOTHROID) 25 MCG tablet Take 25 mcg by mouth daily.       Marland Kitchen lisinopril (PRINIVIL,ZESTRIL) 5 MG tablet Take 1 tablet (5 mg  total) by mouth daily.  30 tablet  6  . naproxen (NAPROSYN) 500 MG tablet Take 1 tablet (500 mg total) by mouth 2 (two) times daily.  30 tablet  0  . potassium chloride (K-DUR,KLOR-CON) 10 MEQ tablet Take 1 tablet (10 mEq total) by mouth daily.  30 tablet  11   No current facility-administered medications for this visit.     Past Surgical History  Procedure Laterality Date  . Coronary artery bypass graft    . Knee arthroscopy    . Trigger finger release Bilateral   . Stent to right femoral artery    . Angioplasty to left femoral artery       No Known Allergies    Family History  Problem Relation Age of Onset  . Heart attack Father   . Diabetes Mother      Social History Mr. Briggs reports that he has been smoking Cigars.  He has never used smokeless tobacco. Mr. Dentremont reports that he does not drink alcohol.   Review of Systems CONSTITUTIONAL: No weight loss, fever, chills, weakness or fatigue.  HEENT: Eyes: No visual loss, blurred vision, double vision or yellow sclerae.No hearing loss, sneezing, congestion, runny nose or sore throat.  SKIN: No rash or itching.  CARDIOVASCULAR: per HPI RESPIRATORY: No shortness of breath, cough or sputum.  GASTROINTESTINAL: No anorexia, nausea, vomiting or diarrhea. No abdominal pain or blood.  GENITOURINARY: No burning on urination, no polyuria NEUROLOGICAL: No headache, dizziness, syncope, paralysis, ataxia, numbness or tingling in the extremities. No change in bowel or bladder control.  MUSCULOSKELETAL: No muscle, back pain, joint pain or stiffness.  LYMPHATICS: No enlarged nodes. No history of splenectomy.  PSYCHIATRIC: No history of depression or anxiety.  ENDOCRINOLOGIC: No reports of sweating, cold or heat intolerance. No polyuria or polydipsia.  Marland Kitchen   Physical Examination p 60 bp 138/64 Wt 183 lbs BMI 26 Gen: resting comfortably, no acute distress HEENT: no scleral icterus, pupils equal round and reactive, no palptable  cervical adenopathy,  CV: RRR, no m/r/g, no JVD, no carotid bruits Resp: Clear to auscultation bilaterally GI: abdomen is soft, non-tender, non-distended, normal bowel sounds, no hepatosplenomegaly MSK: extremities are warm, no edema.  Skin: warm, no rash Neuro:  no focal deficits Psych: appropriate affect   Diagnostic Studies 09/29/13 Cath  Hemodynamic Findings:  Ao: 162/70  LV: 163/19/29  RA: 12  RV: 66/7/16  PA: 67/26 (mean 43)  PCWP: 22  Fick Cardiac Output: 3.5 L/min  Fick Cardiac Index: 1.74 L/min/m2  Central Aortic Saturation: 91%  Pulmonary Artery Saturation: 50%  Angiographic Findings:  Left main: Distal 30% stenosis.  Left Anterior Descending Artery: 100% proximal occlusion. Proximal, mid and distal vessel fills from the patent IMA graft.  Circumflex Artery: Large caliber vessel with large intermediate branch. Diffuse non-obstructive plaque in the large bifurcating intermediate branch. The ostium of the Circumflex has 50% stenosis.  Right Coronary Artery: Large dominant vessel with 100% mid occlusion. The distal vessel fills right to right bridging collaterals and left to right collaterals.  Graft Anatomy:  LIMA to LAD is patent  Free Radial (? Y graft from LIMA) to OM1, OM2, PDA is occluded.  Left Ventricular Angiogram: Deferred.  Impression:  1. Triple vessel CAD with patent LIMA to LAD, occluded RCA with good collateral filling, moderate disease in Circumflex  2. No focal targets for PCI  3. Ischemic cardiomyopathy with acute systolic CHF   06/01/66 Echo  LVEF 15-20%, moderate LVH, severe diffuse hypokinesis, restrictive diastolic dysfunction, multiple WMAs, moderate MR, severe RV dysfunction RV TAPSE 0.7 cm, PASP 61 mmHg.   12/2013 Lab  TC 175 TG 111 LDL 116 HDL 37  TSH 7.7 HgbA1c 6.6 Na 143 K 4.5 Cr 1.22 GFR56      Assessment and Plan  1. CAD/ICM  - severe LV systolic dysfunction, LVEF 15-20%, NYHA II, he does not have an ICD as we are working to optimize  his medications. Last cath described above.  - discussed possible ICD with patient today, he would like to discuss with his family and readdress at next visit - will repeat echo to reeval LVEF as he has been on medical therapy >3 months   2. Hyperlipidemia  - intolerant to statins, continue zetia.     F/u 2 months   Arnoldo Lenis, M.D., F.A.C.C.

## 2014-03-29 NOTE — Patient Instructions (Signed)
Your physician has requested that you have an echocardiogram. Echocardiography is a painless test that uses sound waves to create images of your heart. It provides your doctor with information about the size and shape of your heart and how well your heart's chambers and valves are working. This procedure takes approximately one hour. There are no restrictions for this procedure. Office will contact with results via phone or letter.   Follow up in  2 months

## 2014-03-31 ENCOUNTER — Ambulatory Visit: Payer: Medicare Other | Admitting: Cardiology

## 2014-04-07 ENCOUNTER — Other Ambulatory Visit: Payer: Medicare Other

## 2014-05-03 ENCOUNTER — Ambulatory Visit (INDEPENDENT_AMBULATORY_CARE_PROVIDER_SITE_OTHER): Payer: Medicare Other | Admitting: Orthopedic Surgery

## 2014-05-03 VITALS — Ht 71.0 in | Wt 183.0 lb

## 2014-05-03 DIAGNOSIS — I2510 Atherosclerotic heart disease of native coronary artery without angina pectoris: Secondary | ICD-10-CM

## 2014-05-03 DIAGNOSIS — G5600 Carpal tunnel syndrome, unspecified upper limb: Secondary | ICD-10-CM

## 2014-05-03 DIAGNOSIS — G5602 Carpal tunnel syndrome, left upper limb: Secondary | ICD-10-CM | POA: Insufficient documentation

## 2014-05-03 NOTE — Patient Instructions (Addendum)
Left Carpal Tunnel Release, nurse will call you with date   Carpal Tunnel Surgery The carpal tunnel is a narrow hollow area in the wrist. It is formed by the wrist bones and ligaments. Nerves, blood vessels, and tendons on the palm side of your hand pass through the carpal tunnel. (The palm side is the side of your hand in the direction your fingers bend.) Repeated wrist motion or certain diseases may cause swelling within the tunnel. That is why these are sometimes called repetitive trauma disorders. It is also a common problem in late pregnancy because of water retention. This swelling pinches the main nerve in the wrist (median nerve). It causes the painful condition called carpal tunnel syndrome. A feeling of "pins and needles" may be noticed in the fingers or hand. The entire arm may ache from this condition. Carpal tunnel syndrome may clear up by itself. Cortisone injections may help. An electromyogram may be needed to confirm this diagnosis. This is a test which measures nerve conduction. The nerve conduction is usually slowed in a carpal tunnel syndrome. Sometimes, an operation may be needed to free the pinched nerve.  LET YOUR CAREGIVER KNOW ABOUT:  Allergies  Medications taken including herbs, eye drops, over the counter medications, and creams  Use of steroids (by mouth or creams)  Previous problems with anesthetics or novocaine  Possibility of pregnancy, if this applies  History of blood clots (thrombophlebitis)  History of bleeding or blood problems  Previous surgery  Other health problems RISKS AND COMPLICATIONS  Infection: A germ starts growing in the wound. This can usually be treated with antibiotics.  Damage to other organs may occur.  Bleeding following surgery can be a complication of almost all surgeries. Your surgeon takes every precaution to keep this from happening.  Recurrence (return) of carpal tunnel syndrome following treatment is rare. BEFORE THE  PROCEDURE  Stop smoking at least two weeks prior to surgery. This lowers risk during surgery.  Stop non steroidal medicine for ten days prior to surgery. Also, do not take aspirin unless OK'd by your surgeon.  Your caregiver may tell you to stop taking certain medicine that may affect the outcome of the surgery and your ability to heal. For example, you may need to stop taking anti-inflammatories, such as aspirin, because of possible bleeding problems. Other medicine may have interactions with anesthesia.  BE SURE TO LET YOUR CAREGIVER KNOW IF YOU HAVE BEEN ON STEROIDS (INCLUDING CREAMS) FOR LONG PERIODS OF TIME. THIS IS CRITICAL.  Your caregiver will discuss possible risks and complications with you before surgery. In addition to the usual risks of anesthesia, other common risks and complications include:  Temporary increase in pain due to surgery.  Uncorrected pain.  Infection. You should be present 60 minutes before your procedure or as directed.  PROCEDURE  Carpal tunnel release is generally recommended if symptoms last for 6 months. Surgery involves severing the band of tissue around the wrist to reduce pressure on the median nerve. Surgery is done under local anesthesia and does not require an overnight hospital stay. Many patients require surgery on both hands. The following are types of carpal tunnel release surgery:  Open release surgery, the traditional procedure used to correct carpal tunnel syndrome, consists of making an incision up to 2 inches in the wrist and then cutting the carpal ligament to enlarge the carpal tunnel. The procedure is generally done under local anesthesia on an outpatient basis, unless there are unusual medical considerations.  Endoscopic surgery  may allow faster functional recovery and less post-operative discomfort than traditional open release surgery. The surgeon makes two incisions (cuts) (about 1/2 inch each) in the wrist and palm, inserts a camera  attached to a tube, looks at the tissue on a screen, and cuts the carpal ligament (the tissue that holds joints together). This two-portal endoscopic surgery, generally performed under local anesthesia, is effective and minimizes scarring and scar tenderness, if any. One-portal endoscopic surgery for carpal tunnel syndrome is also available. Although symptoms may be better right after surgery, full recovery from carpal tunnel surgery can take months. Some patients may have infection, nerve damage, stiffness, and pain at the scar. Sometimes the wrist loses strength because the carpal ligament is cut. Patients should take part in physical therapy after surgery to restore wrist strength. Some patients may need to adjust job duties or even change jobs after recovery from surgery. The majority of patients recover completely without complications (additional problems). AFTER THE PROCEDURE After surgery, you will be taken to the recovery area where a nurse will watch and check your progress. Once you're awake, stable, and taking fluids well, without other problems you will be allowed to go home. HOME CARE INSTRUCTIONS   Once at home, an ice pack applied to your operative site may help with discomfort and keep the swelling down.  Follow your caregiver's instructions as to activities, exercises, physical therapy, and driving a car.  Maintain strength and range of motion as instructed.  If you were given a splint to keep your wrist from bending, use it as instructed. It is important to wear the splint at night. Use the splint for as long as you have pain or numbness in your hand, arm or wrist. This may take 1 to 2 months.  If you have pain at night, it may help to elevate your hand above the level of your heart (the center of your chest).  It is important to avoid activities which originally caused your carpal tunnel syndrome for a couple weeks following surgery, or as directed by your surgeon. If your  symptoms are work-related, you may need to talk to your employer about changing to a job that does not require using your wrist.  Only take over-the-counter or prescription medicines for pain, discomfort, or fever as directed by your caregiver.  Following periods of extended use, particularly hard (strenuous) use, apply an ice pack wrapped in a towel to the palm (anterior) side of the affected wrist for 20 to 30 minutes. Repeat as needed three to four times per day. This will help reduce swelling following surgery. SEEK MEDICAL CARE IF:   There is increased bleeding (more than a small spot) from the wound.  You notice redness, swelling, or increasing pain in the wound.  Pus is coming from wound.  An unexplained oral temperature above 102 F (38.9 C) develops.  You notice a foul smell coming from the wound or dressing. SEEK IMMEDIATE MEDICAL CARE IF:   You develop a rash.  You have difficulty breathing.  You have any problems you think are related to allergies. Document Released: 06/25/2004 Document Revised: 02/03/2012 Document Reviewed: 09/17/2007 [facility] Patient Information 2014 [place].

## 2014-05-03 NOTE — Progress Notes (Signed)
Patient ID: Gary Odonnell, male   DOB: 10/03/1932, 80 y.o.   MRN: [id number]  Chief Complaint  Patient presents with  . Hand Pain    left hand pain and numbness.Referred by Dr. Merlene Laughter    HISTORY:  80 year old male previously evaluated by the emergency room on 2 occasions and saw another orthopedist presents with weakness pain and numbness in his left hand.  Throbbing Pain at night 10 out of 10 pain X-rays of the hand normal Carpal tunnel injection which helped for 2-3 weeks Did not tolerate bracing  Past Medical History  Diagnosis Date  . Stroke   . Coronary artery disease   . Hypertension   . Pneumonia   . PAD (peripheral artery disease)    Past Surgical History  Procedure Laterality Date  . Coronary artery bypass graft    . Knee arthroscopy    . Trigger finger release Bilateral   . Stent to right femoral artery    . Angioplasty to left femoral artery     History  Substance Use Topics  . Smoking status: Current Every Day Smoker -- 1.00 packs/day for 60 years    Types: Cigars  . Smokeless tobacco: Never Used     Comment: former cigarette smoker/smoked cigarettes for 30 years  . Alcohol Use: No    No drug allergies  Family history of hypertension stroke heart attack osteoporosis  System review difficulty sleeping visual disturbances otherwise normal addendum dental oral hygiene issues.  Nerve test shows severe left median neuropathy moderate severe right median Vital signs: Ht 5\' 11"  (1.803 m)  Wt 183 lb (83.008 kg)  BMI 25.53 kg/m2   General the patient is well-developed and well-nourished grooming and hygiene are normal Oriented x3 Mood and affect normal Ambulation normal Inspection of the has full range of motion in the right hand adequate grip strength no instability in the wrist and the skin is normal  Left hand has weakened grip strength incomplete flexion and fist formation numbness in the thumb index and long finger and ring finger skin is without  rash or lesion pulses are good  Encounter Diagnosis  Name Primary?  . Carpal tunnel syndrome, left Yes     He has some vague intermittent symptoms of possible altered or other pathology but he does have a significant test and adequate trial of nonoperative treatment. He understands that he may not get relief but he wants to proceed with a left carpal tunnel release.

## 2014-05-04 ENCOUNTER — Other Ambulatory Visit: Payer: Self-pay

## 2014-05-04 ENCOUNTER — Encounter (HOSPITAL_COMMUNITY): Payer: Self-pay | Admitting: Pharmacy Technician

## 2014-05-04 ENCOUNTER — Other Ambulatory Visit: Payer: Self-pay | Admitting: *Deleted

## 2014-05-04 ENCOUNTER — Other Ambulatory Visit (INDEPENDENT_AMBULATORY_CARE_PROVIDER_SITE_OTHER): Payer: Medicare Other

## 2014-05-04 DIAGNOSIS — I359 Nonrheumatic aortic valve disorder, unspecified: Secondary | ICD-10-CM

## 2014-05-04 DIAGNOSIS — I509 Heart failure, unspecified: Secondary | ICD-10-CM

## 2014-05-05 NOTE — Patient Instructions (Addendum)
Your procedure is scheduled on: 05/11/2014  Report to [facility] at   10:30  AM.  Call this number if you have problems the morning of surgery: [phone]   Remember:   Do not drink or eat food:After Midnight.  :  Take these medicines the morning of surgery with A SIP OF WATER: Coreg, Levothyroxine, Lisinopril   Do not wear jewelry, make-up or nail polish.  Do not wear lotions, powders, or perfumes. You may wear deodorant.  Do not shave 48 hours prior to surgery. Men may shave face and neck.  Do not bring valuables to the hospital.  Contacts, dentures or bridgework may not be worn into surgery.  Leave suitcase in the car. After surgery it may be brought to your room.  For patients admitted to the hospital, checkout time is 11:00 AM the day of discharge.   Patients discharged the day of surgery will not be allowed to drive home.    Special Instructions: Shower using CHG night before surgery and shower the day of surgery use CHG.  Use special wash - you have one bottle of CHG for all showers.  You should use approximately 1/2 of the bottle for each shower.   Please read over the following fact sheets that you were given: Pain Booklet, MRSA Information, Surgical Site Infection Prevention and Care and Recovery After Surgery  Carpal Tunnel Release (Repair), Care After Refer to this sheet in the next few weeks. These discharge instructions provide you with general information on caring for yourself after you leave the hospital. Your caregiver may also give you specific instructions. Your treatment has been planned according to the most current medical practices available, but unavoidable complications sometimes occur. If you have any problems or questions after discharge, please call your caregiver. HOME CARE INSTRUCTIONS   Have a responsible person with you for 24 hours.  Do not drive a car or take public transportation for 24 hours.  Only take over-the-counter or prescription medicines for  pain, discomfort, or fever as directed by your caregiver. Take them as directed.  You may put ice on the palm side of the affected wrist.  Put ice in a plastic bag.  Place a towel between your skin and the bag.  Leave the ice on for 15-20 minutes, 03-04 times per day.  If you were given a splint to keep your wrist from bending, use it as directed. It is important to wear the splint at night or as directed. Use the splint for as long as you have pain or numbness in your hand, arm, or wrist. This may take 1 to 2 months.  Keep your hand raised (elevated) above the level of your heart as much as possible. This keeps swelling down and helps with discomfort.  Change bandages (dressings) as directed.  Keep the wound clean and dry. SEEK MEDICAL CARE IF:   You develop pain not relieved with medicines.  You develop numbness of your hand.  You develop bleeding from your surgical site.  You have an oral temperature above 102 F (38.9 C).  You develop redness or swelling of the surgical site.  You develop new, unexplained problems. SEEK IMMEDIATE MEDICAL CARE IF:   You develop a rash.  You have difficulty breathing.  You develop any reaction or side effects to medicines given. MAKE SURE YOU:   Understand these instructions.  Will watch your condition.  Will get help right away if you are not doing well or get worse. Document  Released: 05/31/2005 Document Revised: 09/01/2013 Document Reviewed: 09/17/2007 [facility] Patient Information 2014 [place], [place]. PATIENT INSTRUCTIONS POST-ANESTHESIA  IMMEDIATELY FOLLOWING SURGERY:  Do not drive or operate machinery for the first twenty four hours after surgery.  Do not make any important decisions for twenty four hours after surgery or while taking narcotic pain medications or sedatives.  If you develop intractable nausea and vomiting or a severe headache please notify your doctor immediately.  FOLLOW-UP:  Please make an appointment with  your surgeon as instructed. You do not need to follow up with anesthesia unless specifically instructed to do so.  WOUND CARE INSTRUCTIONS (if applicable):  Keep a dry clean dressing on the anesthesia/puncture wound site if there is drainage.  Once the wound has quit draining you may leave it open to air.  Generally you should leave the bandage intact for twenty four hours unless there is drainage.  If the epidural site drains for more than 36-48 hours please call the anesthesia department.  QUESTIONS?:  Please feel free to call your physician or the hospital operator if you have any questions, and they will be happy to assist you.

## 2014-05-06 ENCOUNTER — Encounter (HOSPITAL_COMMUNITY)
Admission: RE | Admit: 2014-05-06 | Discharge: 2014-05-06 | Disposition: A | Discharge status: Home / Self Care | Payer: Medicare Other | Source: Ambulatory Visit | Attending: Orthopedic Surgery | Admitting: Orthopedic Surgery

## 2014-05-06 ENCOUNTER — Telehealth: Payer: Self-pay | Admitting: Orthopedic Surgery

## 2014-05-06 ENCOUNTER — Encounter (HOSPITAL_COMMUNITY): Payer: Self-pay

## 2014-05-06 ENCOUNTER — Telehealth: Payer: Self-pay | Admitting: *Deleted

## 2014-05-06 DIAGNOSIS — Z01812 Encounter for preprocedural laboratory examination: Secondary | ICD-10-CM | POA: Insufficient documentation

## 2014-05-06 HISTORY — DX: Pure hypercholesterolemia, unspecified: E78.00

## 2014-05-06 LAB — BASIC METABOLIC PANEL
BUN: 29 mg/dL — AB (ref 6–23)
CHLORIDE: 102 meq/L (ref 96–112)
CO2: 29 mEq/L (ref 19–32)
CREATININE: 1.31 mg/dL (ref 0.50–1.35)
Calcium: 10.1 mg/dL (ref 8.4–10.5)
GFR calc non Af Amer: 50 mL/min — ABNORMAL LOW (ref >90)
GFR, EST AFRICAN AMERICAN: 58 mL/min — AB (ref >90)
GLUCOSE: 98 mg/dL (ref 70–99)
Potassium: 5 mEq/L (ref 3.7–5.3)
Sodium: 142 mEq/L (ref 137–147)

## 2014-05-06 LAB — HEMOGLOBIN AND HEMATOCRIT, BLOOD
HCT: 42.7 % (ref 39.0–52.0)
HEMOGLOBIN: 14.6 g/dL (ref 13.0–17.0)

## 2014-05-06 MED ORDER — CHLORHEXIDINE GLUCONATE 4 % EX LIQD: 60.0000 mL | Freq: Once | CUTANEOUS | Status: DC

## 2014-05-06 NOTE — Telephone Encounter (Signed)
Message copied by Laurine Blazer on Fri May 06, 2014  2:42 PM ------      Message from: [place] F      Created: Thu May 05, 2014  8:47 AM       Echo shows that his heart is much stronger from previous studies. It is still not quite normal, but the overall function is improved. He will not need an ICD.             Zandra Abts MD ------

## 2014-05-06 NOTE — Telephone Encounter (Signed)
Regarding out-patient surgery scheduled 05/11/16, CPT 64721, ICD9 354.0, per Medicare guidelines, no pre-authorization required; per [employer], secondary, online referral/authorization tool indicates no pre-authorization required for secondary payor coverage.  [employer] ph# [phone]

## 2014-05-06 NOTE — Progress Notes (Signed)
05/06/14 0959  OBSTRUCTIVE SLEEP APNEA  Have you ever been diagnosed with sleep apnea through a sleep study? No  Do you snore loudly (loud enough to be heard through closed doors)?  1  Do you often feel tired, fatigued, or sleepy during the daytime? 0  Has anyone observed you stop breathing during your sleep? 0  Do you have, or are you being treated for high blood pressure? 1  BMI more than 35 kg/m2? 0  Age over 78 years old? 1  Neck circumference greater than 40 cm/16 inches? 0  Gender: 1  Obstructive Sleep Apnea Score 4

## 2014-05-06 NOTE — Telephone Encounter (Signed)
Notes Recorded by Laurine Blazer, LPN on 1/44/3601 at 6:58 PM Patient notified. Follow up scheduled for 05/31/2014 with Dr. Harl Bowie.

## 2014-05-10 NOTE — H&P (Signed)
  Chief Complaint   Patient presents with   .  Hand Pain       left hand pain and numbness.Referred by Dr. Merlene Laughter     HISTORY:  80 year old male previously evaluated by the emergency room on 2 occasions and saw another orthopedist presents with weakness pain and numbness in his left hand.  Throbbing Pain at night 10 out of 10 pain X-rays of the hand normal Carpal tunnel injection which helped for 2-3 weeks Did not tolerate bracing    Past Medical History   Diagnosis  Date   .  Stroke     .  Coronary artery disease     .  Hypertension     .  Pneumonia     .  PAD (peripheral artery disease)        Past Surgical History   Procedure  Laterality  Date   .  Coronary artery bypass graft       .  Knee arthroscopy       .  Trigger finger release  Bilateral     .  Stent to right femoral artery       .  Angioplasty to left femoral artery          History   Substance Use Topics   .  Smoking status:  Current Every Day Smoker -- 1.00 packs/day for 60 years       Types:  Cigars   .  Smokeless tobacco:  Never Used         Comment: former cigarette smoker/smoked cigarettes for 30 years   .  Alcohol Use:  No     No drug allergies  Family history of hypertension stroke heart attack osteoporosis  System review difficulty sleeping visual disturbances otherwise normal addendum dental oral hygiene issues.  Nerve test shows severe left median neuropathy moderate severe right median Vital signs: Ht 5\' 11"  (1.803 m)  Wt 183 lb (83.008 kg)  BMI 25.53 kg/m2   General the patient is well-developed and well-nourished grooming and hygiene are normal Oriented x3 Mood and affect normal Ambulation normal Inspection of the has full range of motion in the right hand adequate grip strength no instability in the wrist and the skin is normal  Left hand has weakened grip strength incomplete flexion and fist formation numbness in the thumb index and long finger and ring finger skin is without  rash or lesion pulses are good    Encounter Diagnosis   Name  Primary?   .  Carpal tunnel syndrome, left  Yes      He has some vague intermittent symptoms of possible altered or other pathology but he does have a significant test and adequate trial of nonoperative treatment. He understands that he may not get relief but he wants to proceed with a   left carpal tunnel release.

## 2014-05-11 ENCOUNTER — Encounter (HOSPITAL_COMMUNITY): Payer: Self-pay | Admitting: *Deleted

## 2014-05-11 ENCOUNTER — Encounter (HOSPITAL_COMMUNITY)
Admission: RE | Disposition: A | Discharge status: Home / Self Care | Payer: Self-pay | Source: Ambulatory Visit | Attending: Orthopedic Surgery

## 2014-05-11 ENCOUNTER — Encounter (HOSPITAL_COMMUNITY): Payer: Medicare Other | Admitting: Anesthesiology

## 2014-05-11 ENCOUNTER — Ambulatory Visit (HOSPITAL_COMMUNITY)
Admission: RE | Admit: 2014-05-11 | Discharge: 2014-05-11 | Disposition: A | Discharge status: Home / Self Care | Payer: Medicare Other | Source: Ambulatory Visit | Attending: Orthopedic Surgery | Admitting: Orthopedic Surgery

## 2014-05-11 ENCOUNTER — Ambulatory Visit (HOSPITAL_COMMUNITY): Payer: Medicare Other | Admitting: Anesthesiology

## 2014-05-11 DIAGNOSIS — I2510 Atherosclerotic heart disease of native coronary artery without angina pectoris: Secondary | ICD-10-CM | POA: Insufficient documentation

## 2014-05-11 DIAGNOSIS — I509 Heart failure, unspecified: Secondary | ICD-10-CM | POA: Insufficient documentation

## 2014-05-11 DIAGNOSIS — Z8673 Personal history of transient ischemic attack (TIA), and cerebral infarction without residual deficits: Secondary | ICD-10-CM | POA: Insufficient documentation

## 2014-05-11 DIAGNOSIS — G5600 Carpal tunnel syndrome, unspecified upper limb: Secondary | ICD-10-CM | POA: Insufficient documentation

## 2014-05-11 DIAGNOSIS — F17200 Nicotine dependence, unspecified, uncomplicated: Secondary | ICD-10-CM | POA: Insufficient documentation

## 2014-05-11 DIAGNOSIS — Z951 Presence of aortocoronary bypass graft: Secondary | ICD-10-CM | POA: Insufficient documentation

## 2014-05-11 DIAGNOSIS — J45909 Unspecified asthma, uncomplicated: Secondary | ICD-10-CM | POA: Insufficient documentation

## 2014-05-11 DIAGNOSIS — I10 Essential (primary) hypertension: Secondary | ICD-10-CM | POA: Insufficient documentation

## 2014-05-11 DIAGNOSIS — I739 Peripheral vascular disease, unspecified: Secondary | ICD-10-CM | POA: Insufficient documentation

## 2014-05-11 DIAGNOSIS — E039 Hypothyroidism, unspecified: Secondary | ICD-10-CM | POA: Insufficient documentation

## 2014-05-11 HISTORY — PX: CARPAL TUNNEL RELEASE: SHX101

## 2014-05-11 LAB — GLUCOSE, CAPILLARY
GLUCOSE-CAPILLARY: 124 mg/dL — AB (ref 70–99)
Glucose-Capillary: 111 mg/dL — ABNORMAL HIGH (ref 70–99)

## 2014-05-11 SURGERY — CARPAL TUNNEL RELEASE
Anesthesia: Monitor Anesthesia Care | Site: Wrist | Laterality: Left

## 2014-05-11 MED ORDER — FENTANYL CITRATE 0.05 MG/ML IJ SOLN
INTRAMUSCULAR | Status: AC
  Filled 2014-05-11: qty 2

## 2014-05-11 MED ORDER — SODIUM CHLORIDE 0.9 % IR SOLN
Status: DC | PRN
  Administered 2014-05-11: 1000 mL

## 2014-05-11 MED ORDER — ONDANSETRON HCL 4 MG/2ML IJ SOLN
INTRAMUSCULAR | Status: AC
  Filled 2014-05-11: qty 2

## 2014-05-11 MED ORDER — FENTANYL CITRATE 0.05 MG/ML IJ SOLN
25.0000 ug | INTRAMUSCULAR | Status: AC
  Administered 2014-05-11 (×2): 25 ug via INTRAVENOUS

## 2014-05-11 MED ORDER — CEFAZOLIN SODIUM-DEXTROSE 2-3 GM-% IV SOLR
INTRAVENOUS | Status: AC
  Filled 2014-05-11: qty 50

## 2014-05-11 MED ORDER — FENTANYL CITRATE 0.05 MG/ML IJ SOLN
INTRAMUSCULAR | Status: DC | PRN
  Administered 2014-05-11: 25 ug via INTRAVENOUS

## 2014-05-11 MED ORDER — PROPOFOL INFUSION 10 MG/ML OPTIME
INTRAVENOUS | Status: DC | PRN
  Administered 2014-05-11: 30 ug/kg/min via INTRAVENOUS

## 2014-05-11 MED ORDER — ARTIFICIAL TEARS OP OINT
TOPICAL_OINTMENT | OPHTHALMIC | Status: AC
  Filled 2014-05-11: qty 3.5

## 2014-05-11 MED ORDER — MIDAZOLAM HCL 2 MG/2ML IJ SOLN
1.0000 mg | INTRAMUSCULAR | Status: DC | PRN
  Administered 2014-05-11: 2 mg via INTRAVENOUS

## 2014-05-11 MED ORDER — LACTATED RINGERS IV SOLN
INTRAVENOUS | Status: DC
  Administered 2014-05-11: 1000 mL via INTRAVENOUS

## 2014-05-11 MED ORDER — HYDROCODONE-ACETAMINOPHEN 7.5-325 MG PO TABS: 1.0000 | ORAL_TABLET | ORAL | Status: DC | PRN

## 2014-05-11 MED ORDER — LIDOCAINE HCL (PF) 0.5 % IJ SOLN
INTRAMUSCULAR | Status: DC | PRN
  Administered 2014-05-11: 50 mL via INTRAVENOUS

## 2014-05-11 MED ORDER — ONDANSETRON HCL 4 MG/2ML IJ SOLN
4.0000 mg | Freq: Once | INTRAMUSCULAR | Status: AC
  Administered 2014-05-11: 4 mg via INTRAVENOUS

## 2014-05-11 MED ORDER — FENTANYL CITRATE 0.05 MG/ML IJ SOLN: 25.0000 ug | INTRAMUSCULAR | Status: DC | PRN

## 2014-05-11 MED ORDER — PROPOFOL 10 MG/ML IV BOLUS
INTRAVENOUS | Status: AC
  Filled 2014-05-11: qty 20

## 2014-05-11 MED ORDER — ONDANSETRON HCL 4 MG/2ML IJ SOLN: 4.0000 mg | Freq: Once | INTRAMUSCULAR | Status: DC | PRN

## 2014-05-11 MED ORDER — BUPIVACAINE HCL (PF) 0.5 % IJ SOLN
INTRAMUSCULAR | Status: DC | PRN
  Administered 2014-05-11: 10 mL

## 2014-05-11 MED ORDER — MIDAZOLAM HCL 2 MG/2ML IJ SOLN
INTRAMUSCULAR | Status: AC
  Filled 2014-05-11: qty 2

## 2014-05-11 MED ORDER — CEFAZOLIN SODIUM-DEXTROSE 2-3 GM-% IV SOLR
2.0000 g | INTRAVENOUS | Status: AC
  Administered 2014-05-11: 2 g via INTRAVENOUS

## 2014-05-11 MED ORDER — SODIUM CHLORIDE 0.9 % IJ SOLN
INTRAMUSCULAR | Status: AC
  Filled 2014-05-11: qty 10

## 2014-05-11 MED ORDER — BUPIVACAINE HCL (PF) 0.5 % IJ SOLN
INTRAMUSCULAR | Status: AC
  Filled 2014-05-11: qty 30

## 2014-05-11 SURGICAL SUPPLY — 40 items
BAG HAMPER (MISCELLANEOUS) ×3 IMPLANT
BANDAGE ELASTIC 3 VELCRO NS (GAUZE/BANDAGES/DRESSINGS) ×3 IMPLANT
BANDAGE ESMARK 4X12 BL STRL LF (DISPOSABLE) ×1 IMPLANT
BANDAGE GAUZE ELAST BULKY 4 IN (GAUZE/BANDAGES/DRESSINGS) ×3 IMPLANT
BLADE 15 SAFETY STRL DISP (BLADE) ×3 IMPLANT
BLADE SURG 15 STRL LF DISP TIS (BLADE) ×1 IMPLANT
BLADE SURG 15 STRL SS (BLADE) ×2
BNDG COHESIVE 4X5 TAN STRL (GAUZE/BANDAGES/DRESSINGS) ×3 IMPLANT
BNDG ESMARK 4X12 BLUE STRL LF (DISPOSABLE) ×3
CHLORAPREP W/TINT 26ML (MISCELLANEOUS) ×3 IMPLANT
CLOTH BEACON ORANGE TIMEOUT ST (SAFETY) ×3 IMPLANT
COVER LIGHT HANDLE STERIS (MISCELLANEOUS) ×6 IMPLANT
CUFF TOURNIQUET SINGLE 18IN (TOURNIQUET CUFF) ×3 IMPLANT
DECANTER SPIKE VIAL GLASS SM (MISCELLANEOUS) ×3 IMPLANT
DRSG XEROFORM 1X8 (GAUZE/BANDAGES/DRESSINGS) ×3 IMPLANT
ELECT NEEDLE TIP 2.8 STRL (NEEDLE) ×3 IMPLANT
ELECT REM PT RETURN 9FT ADLT (ELECTROSURGICAL) ×3
ELECTRODE REM PT RTRN 9FT ADLT (ELECTROSURGICAL) ×1 IMPLANT
GAUZE SPONGE 4X4 12PLY STRL (GAUZE/BANDAGES/DRESSINGS) IMPLANT
GLOVE BIOGEL PI IND STRL 7.0 (GLOVE) ×1 IMPLANT
GLOVE BIOGEL PI INDICATOR 7.0 (GLOVE) ×2
GLOVE EXAM NITRILE MD LF STRL (GLOVE) ×3 IMPLANT
GLOVE OPTIFIT SS 6.5 STRL BRWN (GLOVE) ×3 IMPLANT
GLOVE SKINSENSE NS SZ8.0 LF (GLOVE) ×2
GLOVE SKINSENSE STRL SZ8.0 LF (GLOVE) ×1 IMPLANT
GLOVE SS N UNI LF 8.5 STRL (GLOVE) ×3 IMPLANT
GOWN STRL REUS W/ TWL LRG LVL3 (GOWN DISPOSABLE) ×1 IMPLANT
GOWN STRL REUS W/TWL LRG LVL3 (GOWN DISPOSABLE) ×2
GOWN STRL REUS W/TWL XL LVL3 (GOWN DISPOSABLE) ×3 IMPLANT
HAND ALUMI XLG (SOFTGOODS) ×3 IMPLANT
KIT ROOM TURNOVER APOR (KITS) ×3 IMPLANT
MANIFOLD NEPTUNE II (INSTRUMENTS) ×3 IMPLANT
NEEDLE HYPO 21X1.5 SAFETY (NEEDLE) ×3 IMPLANT
NS IRRIG 1000ML POUR BTL (IV SOLUTION) ×3 IMPLANT
PACK BASIC LIMB (CUSTOM PROCEDURE TRAY) ×3 IMPLANT
PAD ARMBOARD 7.5X6 YLW CONV (MISCELLANEOUS) ×3 IMPLANT
SET BASIN LINEN APH (SET/KITS/TRAYS/PACK) ×3 IMPLANT
SPONGE GAUZE 4X4 12PLY (GAUZE/BANDAGES/DRESSINGS) ×3 IMPLANT
SUT ETHILON 3 0 FSL (SUTURE) ×3 IMPLANT
SYR CONTROL 10ML LL (SYRINGE) ×3 IMPLANT

## 2014-05-11 NOTE — Brief Op Note (Signed)
05/11/2014  11:40 AM  PATIENT:  Gary Odonnell  80 y.o. male  PRE-OPERATIVE DIAGNOSIS:  Left Carpal Tunnel Syndrome  POST-OPERATIVE DIAGNOSIS:  Left Carpal Tunnel Syndrome  PROCEDURE:  Procedure(s): LEFT CARPAL TUNNEL RELEASE (Left)  SURGEON:  Surgeon(s) and Role:    * Carole Civil, MD - Primary  PHYSICIAN ASSISTANT:   ASSISTANTS: none   ANESTHESIA:   regional  EBL:     BLOOD ADMINISTERED:none  DRAINS: none   LOCAL MEDICATIONS USED:  MARCAINE     SPECIMEN:  No Specimen  DISPOSITION OF SPECIMEN:  N/A  COUNTS:  YES  TOURNIQUET:   Total Tourniquet Time Documented: Upper Arm (Left) - 27 minutes Total: Upper Arm (Left) - 27 minutes   DICTATION: .Viviann Spare Dictation  PLAN OF CARE: Discharge to home after PACU  PATIENT DISPOSITION:  PACU - hemodynamically stable.   Delay start of Pharmacological VTE agent (>24hrs) due to surgical blood loss or risk of bleeding: not applicable  Preop diagnosis carpal tunnel syndrome LEFT wrist Postop diagnosis carpal tunnel syndrome LEFT wrist Procedure open RIGHT carpal tunnel release  Details of procedure: The patient was identified in the preop holding area and the surgical site LEFT WRIST AND HAND  was marked. The history and physical update was completed.  Antibiotics were ordered and  given IN THE SURGICAL SUITE.  In the surgical suite a Bier block was done and then the left arm was prepped and draped in sterile technique.  At this point the timeout procedure was initiated and completed.  The incision was made over the LEFT carpal tunnel and extended to the palmar fascia.  The palmar fascia was divided bluntly and dissection was carried out to the distal aspect of the transverse carpal ligament.  Blunt dissection was carried out beneath the ligament and sharp dissection was used to release the ligament.  The contents of the carpal tunnel were inspected and found to be free of any other pathology.   Special notation: The median  nerve was severely distorted without core appearance with the nerve actually adherent to the undersurface of the transverse carpal ligament. Blunt dissection was needed to carry out release from the overlying ligament.    Irrigation was performed.  Closure was with interrupted 3-0 nylon suture.  20 cc of plain Sensorcaine was injected around the incision edges.  A sterile bandage was applied.  The tourniquet was released the color of the fingers was normal and the capillary refill was normal.

## 2014-05-11 NOTE — Op Note (Signed)
05/11/2014  11:40 AM  PATIENT:  Gary Odonnell  80 y.o. male  PRE-OPERATIVE DIAGNOSIS:  Left Carpal Tunnel Syndrome  POST-OPERATIVE DIAGNOSIS:  Left Carpal Tunnel Syndrome  PROCEDURE:  Procedure(s): LEFT CARPAL TUNNEL RELEASE (Left)  SURGEON:  Surgeon(s) and Role:    * Carole Civil, MD - Primary  PHYSICIAN ASSISTANT:   ASSISTANTS: none   ANESTHESIA:   regional  EBL:     BLOOD ADMINISTERED:none  DRAINS: none   LOCAL MEDICATIONS USED:  MARCAINE     SPECIMEN:  No Specimen  DISPOSITION OF SPECIMEN:  N/A  COUNTS:  YES  TOURNIQUET:   Total Tourniquet Time Documented: Upper Arm (Left) - 27 minutes Total: Upper Arm (Left) - 27 minutes   DICTATION: .Viviann Spare Dictation  PLAN OF CARE: Discharge to home after PACU  PATIENT DISPOSITION:  PACU - hemodynamically stable.   Delay start of Pharmacological VTE agent (>24hrs) due to surgical blood loss or risk of bleeding: not applicable  Preop diagnosis carpal tunnel syndrome LEFT wrist Postop diagnosis carpal tunnel syndrome LEFT wrist Procedure open RIGHT carpal tunnel release  Details of procedure: The patient was identified in the preop holding area and the surgical site LEFT WRIST AND HAND  was marked. The history and physical update was completed.  Antibiotics were ordered and  given IN THE SURGICAL SUITE.  In the surgical suite a Bier block was done and then the left arm was prepped and draped in sterile technique.  At this point the timeout procedure was initiated and completed.  The incision was made over the LEFT carpal tunnel and extended to the palmar fascia.  The palmar fascia was divided bluntly and dissection was carried out to the distal aspect of the transverse carpal ligament.  Blunt dissection was carried out beneath the ligament and sharp dissection was used to release the ligament.  The contents of the carpal tunnel were inspected and found to be free of any other pathology.   Special notation: The median  nerve was severely distorted without core appearance with the nerve actually adherent to the undersurface of the transverse carpal ligament. Blunt dissection was needed to carry out release from the overlying ligament.    Irrigation was performed.  Closure was with interrupted 3-0 nylon suture.  20 cc of plain Sensorcaine was injected around the incision edges.  A sterile bandage was applied.  The tourniquet was released the color of the fingers was normal and the capillary refill was normal.

## 2014-05-11 NOTE — Anesthesia Postprocedure Evaluation (Signed)
  Anesthesia Post-op Note  Patient: Gary Odonnell  Procedure(s) Performed: Procedure(s): LEFT CARPAL TUNNEL RELEASE (Left)  Patient Location: PACU  Anesthesia Type:MAC  Level of Consciousness: awake, alert  and oriented  Airway and Oxygen Therapy: Patient Spontanous Breathing  Post-op Pain: none  Post-op Assessment: Post-op Vital signs reviewed, Patient's Cardiovascular Status Stable, Respiratory Function Stable, Patent Airway and No signs of Nausea or vomiting  Post-op Vital Signs: Reviewed and stable  Last Vitals:  Filed Vitals:   05/11/14 1055  BP: 143/51  Temp:   Resp: 22    Complications: No apparent anesthesia complications

## 2014-05-11 NOTE — Discharge Instructions (Signed)
Carpal Tunnel Surgery The carpal tunnel is a narrow hollow area in the wrist. It is formed by the wrist bones and ligaments. Nerves, blood vessels, and tendons on the palm side of your hand pass through the carpal tunnel. (The palm side is the side of your hand in the direction your fingers bend.) Repeated wrist motion or certain diseases may cause swelling within the tunnel. That is why these are sometimes called repetitive trauma disorders. It is also a common problem in late pregnancy because of water retention. This swelling pinches the main nerve in the wrist (median nerve). It causes the painful condition called carpal tunnel syndrome. A feeling of pins and needles may be noticed in the fingers or hand. The entire arm may ache from this condition. Carpal tunnel syndrome may clear up by itself. Cortisone injections may help. An electromyogram may be needed to confirm this diagnosis. This is a test which measures nerve conduction. The nerve conduction is usually slowed in a carpal tunnel syndrome. Sometimes, an operation may be needed to free the pinched nerve.  HOME CARE INSTRUCTIONS  If you were given a splint to keep your wrist from bending, use it as instructed. It is important to wear the splint at night. Use the splint for as long as you have pain or numbness in your hand, arm or wrist. This may take 1 to 2 months.   If you have pain at night, it may help to elevate your hand above the level of your heart (the center of your chest).   It is important to avoid activities which originally caused your carpal tunnel syndrome for a couple weeks following surgery, or as directed by your surgeon. If your symptoms are work-related, you may need to talk to your employer about changing to a job that does not require using your wrist.   Only take over-the-counter or prescription medicines for pain, discomfort, or fever as directed by your caregiver.   Following periods of extended use, particularly hard  (strenuous) use, apply an ice pack wrapped in a towel to the palm (anterior) side of the affected wrist for 20 to 30 minutes. Repeat as needed three to four times per day. This will help reduce swelling following surgery.  SEEK MEDICAL CARE IF:  There is increased bleeding (more than a small spot) from the wound.   You notice redness, swelling, or increasing pain in the wound.   Pus is coming from wound.   An unexplained oral temperature above 101.5 develops.   You notice a foul smell coming from the wound or dressing.  SEEK IMMEDIATE MEDICAL CARE IF:  You develop a rash.   You have difficulty breathing.   You have any problems you think are related to allergies.  Document Released: 06/25/2004 Document Re-Released: 02/07/2009 [facility] Patient Information 2011 [place].

## 2014-05-11 NOTE — Transfer of Care (Signed)
Immediate Anesthesia Transfer of Care Note  Patient: Gary Odonnell  Procedure(s) Performed: Procedure(s): LEFT CARPAL TUNNEL RELEASE (Left)  Patient Location: PACU  Anesthesia Type:MAC  Level of Consciousness: awake, alert  and oriented  Airway & Oxygen Therapy: Patient Spontanous Breathing  Post-op Assessment: Report given to PACU RN  Post vital signs: Reviewed and stable  Complications: No apparent anesthesia complications

## 2014-05-11 NOTE — Anesthesia Procedure Notes (Signed)
Anesthesia Regional Block:  Bier block (IV Regional)  Pre-Anesthetic Checklist: ,, timeout performed, Correct Patient, Correct Site, Correct Laterality, Correct Procedure,, site marked, surgical consent,, at surgeon's request Needles:  Injection technique: Single-shot  Needle Type: Other      Needle Gauge: 22 and 22 G    Additional Needles: Bier block (IV Regional)  Nerve Stimulator or Paresthesia:   Additional Responses:  Pulse checked post tourniquet inflation. IV NSL discontinued post injection. Narrative:   Performed by: Personally

## 2014-05-11 NOTE — Interval H&P Note (Signed)
History and Physical Interval Note:  05/11/2014 10:48 AM  Gary Odonnell  has presented today for surgery, with the diagnosis of Left Carpal Tunnel Syndrome  The various methods of treatment have been discussed with the patient and family. After consideration of risks, benefits and other options for treatment, the patient has consented to  Procedure(s): CARPAL TUNNEL RELEASE (Left) as a surgical intervention .  The patient's history has been reviewed, patient examined, no change in status, stable for surgery.  I have reviewed the patient's chart and labs.  Questions were answered to the patient's satisfaction.     Arther Abbott

## 2014-05-11 NOTE — Anesthesia Preprocedure Evaluation (Signed)
Anesthesia Evaluation  Patient identified by MRN, date of birth, ID band Patient awake    Reviewed: Allergy & Precautions, H&P , NPO status , Patient's Chart, lab work & pertinent test results, reviewed documented beta blocker date and time   Airway Mallampati: II TM Distance: >3 FB     Dental  (+) Edentulous Upper, Partial Lower   Pulmonary asthma , Current Smoker,  breath sounds clear to auscultation        Cardiovascular hypertension, Pt. on medications and Pt. on home beta blockers + CAD, + CABG, + Peripheral Vascular Disease and +CHF Rhythm:Regular Rate:Normal     Neuro/Psych  Neuromuscular disease CVA, Residual Symptoms    GI/Hepatic   Endo/Other  Hypothyroidism   Renal/GU Renal disease     Musculoskeletal   Abdominal   Peds  Hematology   Anesthesia Other Findings   Reproductive/Obstetrics                           Anesthesia Physical Anesthesia Plan  ASA: III  Anesthesia Plan: MAC and Bier Block   Post-op Pain Management:    Induction: Intravenous  Airway Management Planned: Nasal Cannula  Additional Equipment:   Intra-op Plan:   Post-operative Plan:   Informed Consent: I have reviewed the patients History and Physical, chart, labs and discussed the procedure including the risks, benefits and alternatives for the proposed anesthesia with the patient or authorized representative who has indicated his/her understanding and acceptance.     Plan Discussed with:   Anesthesia Plan Comments:         Anesthesia Quick Evaluation

## 2014-05-12 ENCOUNTER — Encounter (HOSPITAL_COMMUNITY): Payer: Self-pay | Admitting: Orthopedic Surgery

## 2014-05-13 ENCOUNTER — Other Ambulatory Visit: Payer: Self-pay | Admitting: Orthopedic Surgery

## 2014-05-13 MED ORDER — HYDROCODONE-ACETAMINOPHEN 5-325 MG PO TABS: 1.0000 | ORAL_TABLET | ORAL | Status: DC | PRN

## 2014-05-23 ENCOUNTER — Ambulatory Visit (INDEPENDENT_AMBULATORY_CARE_PROVIDER_SITE_OTHER): Payer: Medicare Other | Admitting: Orthopedic Surgery

## 2014-05-23 VITALS — BP 107/47 | Ht 71.0 in | Wt 183.0 lb

## 2014-05-23 DIAGNOSIS — G5602 Carpal tunnel syndrome, left upper limb: Secondary | ICD-10-CM

## 2014-05-23 DIAGNOSIS — Z9889 Other specified postprocedural states: Secondary | ICD-10-CM

## 2014-05-23 DIAGNOSIS — G5600 Carpal tunnel syndrome, unspecified upper limb: Secondary | ICD-10-CM

## 2014-05-23 NOTE — Progress Notes (Signed)
Patient ID: Gary Odonnell, male   DOB: [birth year], 80 y.o.   MRN: [id number]  Chief Complaint  Patient presents with  . Follow-up    Post op 31, left CTR. DOS 05-11-14.   BP 107/47  Ht 5\' 11"  (1.803 m)  Wt 183 lb (83.008 kg)  BMI 25.53 kg/m2  Carpal tunnel release left with partial resolution of symptoms. He is very happy with the result because he was in so much pain. Every other suture was removed, Steri-Strips applied. Tegaderm applied. Okay to shower. Return for suture removal.

## 2014-05-23 NOTE — Patient Instructions (Signed)
Return Thursday for suture removal

## 2014-05-26 ENCOUNTER — Ambulatory Visit (INDEPENDENT_AMBULATORY_CARE_PROVIDER_SITE_OTHER): Payer: Medicare Other | Admitting: Orthopedic Surgery

## 2014-05-26 ENCOUNTER — Encounter: Payer: Self-pay | Admitting: Orthopedic Surgery

## 2014-05-26 DIAGNOSIS — Z9889 Other specified postprocedural states: Secondary | ICD-10-CM

## 2014-05-26 DIAGNOSIS — G5600 Carpal tunnel syndrome, unspecified upper limb: Secondary | ICD-10-CM

## 2014-05-26 DIAGNOSIS — G5602 Carpal tunnel syndrome, left upper limb: Secondary | ICD-10-CM

## 2014-05-26 NOTE — Patient Instructions (Signed)
Start exercises with your hands. Open and close the hand several times during the day. Make sure you are fully opening the hand and fully closing the hand into a tight fist  Continue to use ice for swelling. You may find you have some scar tenderness which may last up to 6 months.  Followup as arranged

## 2014-05-26 NOTE — Progress Notes (Signed)
Patient ID: Gary Odonnell, male   DOB: [birth year], 80 y.o.   MRN: [id number]  Postop visit status post carpal tunnel release on June 17 left wrist. He still has some numbness but his pain is mostly controlled. All sutures are removed Steri-Strips are applied he has good motion wound looks clean he'll follow up in 6 weeks is advised to start flexion exercises and range of motion exercises of the hand and wrist

## 2014-05-31 ENCOUNTER — Ambulatory Visit (INDEPENDENT_AMBULATORY_CARE_PROVIDER_SITE_OTHER): Payer: Medicare Other | Admitting: Cardiology

## 2014-05-31 ENCOUNTER — Encounter: Payer: Self-pay | Admitting: Cardiology

## 2014-05-31 VITALS — BP 132/62 | HR 59 | Ht 71.0 in | Wt 180.0 lb

## 2014-05-31 DIAGNOSIS — E785 Hyperlipidemia, unspecified: Secondary | ICD-10-CM

## 2014-05-31 DIAGNOSIS — I2510 Atherosclerotic heart disease of native coronary artery without angina pectoris: Secondary | ICD-10-CM

## 2014-05-31 NOTE — Progress Notes (Signed)
Clinical Summary [name] is a 80 y.o.male seen today for follow up of the following medical problems.   1. CAD/ICM  - history of prior CABG 2003 in [place]  - recent admission to [facility] with decompensated heart failure. Workup included an echo which showed an LVEF of 15%. He was transferred to [facility] for further management.  - cath showed LM 30%, LAD 100% proximal, LCX 50% ostial, RCA 100% mid with distal vessel filling with right to right and left to right collaterals. LIMA-LAD patent, graft from [place] to OM1, OM2, PDA occluded. No PCI targets, recommendations for medical management. RHC mean PA 43, wedge 22, CI 1.74.  - repeat echo 04/2014 shows LVEF improved to 40-45%.   - exertion is limited by leg weakness and lower back pain, denies DOE. No LE edema, no orthopnea, no PND  - limiting Na intake, he is taking naproxen bid.   2. Hyperlipidemia  - compliant with zetia  - lipitor and crestor have caused prior muscle aches, not on statin   Past Medical History  Diagnosis Date  . Coronary artery disease   . Hypertension   . PAD (peripheral artery disease)   . Hypothyroidism   . Stroke 11/2008  . Pneumonia 09/2013  . Hypercholesteremia      No Known Allergies   Current Outpatient Prescriptions  Medication Sig Dispense Refill  . albuterol (PROVENTIL HFA;VENTOLIN HFA) 108 (90 BASE) MCG/ACT inhaler Inhale 1-2 puffs into the lungs every 6 (six) hours as needed for wheezing.  1 Inhaler  0  . aspirin 81 MG tablet Take 1 tablet (81 mg total) by mouth daily.      . carvedilol (COREG) 25 MG tablet Take 1 tablet (25 mg total) by mouth 2 (two) times daily with a meal.  180 tablet  3  . ezetimibe (ZETIA) 10 MG tablet Take 1 tablet (10 mg total) by mouth daily.  30 tablet  11  . furosemide (LASIX) 20 MG tablet Take 3 tablets (60 mg total) by mouth daily.  270 tablet  3  . HYDROcodone-acetaminophen (NORCO) 7.5-325 MG per tablet Take 1 tablet by mouth every 4 (four) hours as  needed for moderate pain.  42 tablet  0  . HYDROcodone-acetaminophen (NORCO/VICODIN) 5-325 MG per tablet Take 1 tablet by mouth every 4 (four) hours as needed for moderate pain.  42 tablet  0  . levothyroxine (SYNTHROID, LEVOTHROID) 25 MCG tablet Take 25 mcg by mouth daily.       Marland Kitchen lisinopril (PRINIVIL,ZESTRIL) 5 MG tablet Take 1 tablet (5 mg total) by mouth daily.  30 tablet  6  . naproxen (NAPROSYN) 500 MG tablet Take 1 tablet (500 mg total) by mouth 2 (two) times daily.  30 tablet  0  . potassium chloride (K-DUR,KLOR-CON) 10 MEQ tablet Take 1 tablet (10 mEq total) by mouth daily.  30 tablet  11   No current facility-administered medications for this visit.     Past Surgical History  Procedure Laterality Date  . Knee arthroscopy Right   . Trigger finger release Bilateral   . Stent to right femoral artery    . Angioplasty to left femoral artery    . Coronary artery bypass graft      x4  . Carpal tunnel release Left 05/11/2014    Procedure: LEFT CARPAL TUNNEL RELEASE;  Surgeon: Gary Civil, MD;  Location: [facility];  Service: Orthopedics;  Laterality: Left;     No Known Allergies  Family History  Problem Relation Age of Onset  . Heart attack Father   . Diabetes Mother      Social History [name] reports that he has been smoking Cigars.  He has never used smokeless tobacco. [name] reports that he does not drink alcohol.   Review of Systems CONSTITUTIONAL: No weight loss, fever, chills, weakness or fatigue.  HEENT: Eyes: No visual loss, blurred vision, double vision or yellow sclerae.No hearing loss, sneezing, congestion, runny nose or sore throat.  SKIN: No rash or itching.  CARDIOVASCULAR: per HPI RESPIRATORY: No shortness of breath, cough or sputum.  GASTROINTESTINAL: No anorexia, nausea, vomiting or diarrhea. No abdominal pain or blood.  GENITOURINARY: No burning on urination, no polyuria NEUROLOGICAL: No headache, dizziness, syncope, paralysis, ataxia,  numbness or tingling in the extremities. No change in bowel or bladder control.  MUSCULOSKELETAL: chronic joint pain LYMPHATICS: No enlarged nodes. No history of splenectomy.  PSYCHIATRIC: No history of depression or anxiety.  ENDOCRINOLOGIC: No reports of sweating, cold or heat intolerance. No polyuria or polydipsia.  Marland Kitchen   Physical Examination p 59 bp 132/62 Wt 180 lbs BMI 25 Gen: resting comfortably, no acute distress HEENT: no scleral icterus, pupils equal round and reactive, no palptable cervical adenopathy,  CV: RRR, 2/6 systolic murmur at apex, no JVD Resp: Clear to auscultation bilaterally GI: abdomen is soft, non-tender, non-distended, normal bowel sounds, no hepatosplenomegaly MSK: extremities are warm, no edema.  Skin: warm, no rash Neuro:  no focal deficits Psych: appropriate affect   Diagnostic Studies 09/29/13 Cath  Hemodynamic Findings:  Ao: 162/70  LV: 163/19/29  RA: 12  RV: 66/7/16  PA: 67/26 (mean 43)  PCWP: 22  Fick Cardiac Output: 3.5 L/min  Fick Cardiac Index: 1.74 L/min/m2  Central Aortic Saturation: 91%  Pulmonary Artery Saturation: 50%  Angiographic Findings:  Left main: Distal 30% stenosis.  Left Anterior Descending Artery: 100% proximal occlusion. Proximal, mid and distal vessel fills from the patent IMA graft.  Circumflex Artery: Large caliber vessel with large intermediate Gary Odonnell. Diffuse non-obstructive plaque in the large bifurcating intermediate Gary Odonnell. The ostium of the Circumflex has 50% stenosis.  Right Coronary Artery: Large dominant vessel with 100% mid occlusion. The distal vessel fills right to right bridging collaterals and left to right collaterals.  Graft Anatomy:  LIMA to LAD is patent  Free Radial (? Y graft from LIMA) to OM1, OM2, PDA is occluded.  Left Ventricular Angiogram: Deferred.  Impression:  1. Triple vessel CAD with patent LIMA to LAD, occluded RCA with good collateral filling, moderate disease in Circumflex  2. No focal  targets for PCI  3. Ischemic cardiomyopathy with acute systolic CHF   61/6/07 Echo  LVEF 15-20%, moderate LVH, severe diffuse hypokinesis, restrictive diastolic dysfunction, multiple WMAs, moderate MR, severe RV dysfunction RV TAPSE 0.7 cm, PASP 61 mmHg.   12/2013 Lab  TC 175 TG 111 LDL 116 HDL 37  TSH 7.7 HgbA1c 6.6 Na 143 K 4.5 Cr 1.22 GFR56   04/2014 Echo Study Conclusions  - Left ventricle: The cavity size was normal. Wall thickness was increased in a pattern of mild LVH. Systolic function was mildly to moderately reduced. The estimated ejection fraction was in the range of 40% to 45%. There is diastolic dysfunction, indeterminant grade. - Regional wall motion abnormality: Hypokinesis of the mid anterior and basal-mid anteroseptal myocardium. - Aortic valve: Moderately calcified annulus. Trileaflet; moderately thickened leaflets. Valve area (VTI): 2.48 cm^2. Valve area (Vmax): 2.23 cm^2. - Mitral valve: Moderately calcified  annulus. Mildly thickened leaflets . - Left atrium: The atrium was moderately dilated. - Right atrium: The atrium was mildly dilated. - Technically adequate study.     Assessment and Plan  1. CAD/ICM  - previously severe LV systolic dysfunction, LVEF 15-20%. Repeat echo shows improved function at 40-45%. He is NYHA II. Last cath described above.  - he has had somewhat labile renal function, have not been aggressive in increasing ACE or starting spironolactone. - continue current medical therapy   2. Hyperlipidemia  - intolerant to statins, continue zetia.    F/u 6 months     Arnoldo Lenis, M.D., F.A.C.C.

## 2014-05-31 NOTE — Patient Instructions (Signed)
   Stop Naproxen Continue all other medications.   Your physician wants you to follow up in: 6 months.  You will receive a reminder letter in the mail one-two months in advance.  If you don't receive a letter, please call our office to schedule the follow up appointment

## 2014-06-01 ENCOUNTER — Telehealth: Payer: Self-pay | Admitting: *Deleted

## 2014-06-01 NOTE — Telephone Encounter (Signed)
Message left on voice mail - questions about medication.  Returned call - had questions about his Hydralazine & Imdur.  Stated he & his wife were going through his supply of bottles and found those 2 were not on his list.  Informed patient that these 2 medications had been stopped 01/27/2014 by Dr. Harl Bowie.  Patient verbalized understanding.

## 2014-07-07 ENCOUNTER — Ambulatory Visit (INDEPENDENT_AMBULATORY_CARE_PROVIDER_SITE_OTHER): Payer: Self-pay | Admitting: Orthopedic Surgery

## 2014-07-07 ENCOUNTER — Encounter: Payer: Self-pay | Admitting: Orthopedic Surgery

## 2014-07-07 VITALS — BP 112/51 | Ht 71.0 in | Wt 180.0 lb

## 2014-07-07 DIAGNOSIS — G5602 Carpal tunnel syndrome, left upper limb: Secondary | ICD-10-CM

## 2014-07-07 DIAGNOSIS — Z9889 Other specified postprocedural states: Secondary | ICD-10-CM

## 2014-07-07 DIAGNOSIS — G5600 Carpal tunnel syndrome, unspecified upper limb: Secondary | ICD-10-CM

## 2014-07-07 NOTE — Progress Notes (Signed)
Postop visit  Chief Complaint  Patient presents with  . Follow-up    6 week recheck on left CTR, DOS 05-11-14.    BP 112/51  Ht 5\' 11"  (1.803 m)  Wt 180 lb (81.647 kg)  BMI 25.12 kg/m2  Patient has numbness in the thumb index long and part of the ring finger. However his pain is gone and his range of motion is returning has good enough grip strength to start a weed eater  I would give him at least till December to see if his feeling comes back. For some reason he had a severe scarring of his median nerve to his transverse carpal ligament.

## 2014-08-24 ENCOUNTER — Telehealth: Payer: Self-pay | Admitting: *Deleted

## 2014-08-24 NOTE — Telephone Encounter (Signed)
Pt notified that it is okay to take 2 tablets. States that he "is as skinny as ever". Pt informed that is he notices swelling or weight gain to return to taking 3 tablets a day. Pt verbalized understanding. He will call back if he notes any changes.

## 2014-08-24 NOTE — Telephone Encounter (Signed)
If he is not having any significant swelling or weight again continuing twice a day is fine. If notes swelling or weight gain would increase to 3 times a day until resolved   Zandra Abts MD

## 2014-08-24 NOTE — Telephone Encounter (Signed)
Pt called regarding Lasix refills. Pt states he has atleast 60 tablets left. Although his bottle states take 3 times a day he hs only been taking 2 a day. He could not recall who told him to only take 2 a day. At last OV pt was to be taking 3. Pt states that he hasn't notice any weight gain.  Will fwd to Dr. Harl Bowie to see if it's okay to continue taking two a day or if he should return to 3.

## 2014-09-16 ENCOUNTER — Other Ambulatory Visit: Payer: Self-pay | Admitting: Cardiology

## 2014-09-29 ENCOUNTER — Other Ambulatory Visit: Payer: Self-pay | Admitting: Cardiology

## 2014-11-03 ENCOUNTER — Encounter (HOSPITAL_COMMUNITY): Payer: Self-pay | Admitting: Cardiovascular Disease

## 2014-11-10 ENCOUNTER — Ambulatory Visit (INDEPENDENT_AMBULATORY_CARE_PROVIDER_SITE_OTHER): Payer: Medicare Other | Admitting: Orthopedic Surgery

## 2014-11-10 ENCOUNTER — Other Ambulatory Visit (HOSPITAL_COMMUNITY): Payer: Self-pay | Admitting: Adult Health Nurse Practitioner

## 2014-11-10 DIAGNOSIS — R911 Solitary pulmonary nodule: Secondary | ICD-10-CM

## 2014-11-10 DIAGNOSIS — G5602 Carpal tunnel syndrome, left upper limb: Secondary | ICD-10-CM

## 2014-11-10 DIAGNOSIS — Z9889 Other specified postprocedural states: Secondary | ICD-10-CM

## 2014-11-10 DIAGNOSIS — I2510 Atherosclerotic heart disease of native coronary artery without angina pectoris: Secondary | ICD-10-CM

## 2014-11-10 NOTE — Progress Notes (Signed)
Patient ID: Gary Odonnell, male   DOB: 1931-12-11, 80 y.o.   MRN: [id number] 6 month follow-up visit status post carpal tunnel release in June 2015. However his carpal tunnels brought on by a posttraumatic event. It was difficult to diagnose presented to me after several months to a year symptoms and it was expressed that we may not get a good result  He did get pain relief however he still has numbness in the carpal tunnel distribution and decreased range of motion  He has mild tenderness over his incision he has decreased flexion of his index, long, thumb difficult grip.  He is aware of his situation but since he is not having pain he does not wish to pursue any further.  He has been released

## 2014-11-15 ENCOUNTER — Ambulatory Visit (HOSPITAL_COMMUNITY)
Admission: RE | Admit: 2014-11-15 | Discharge: 2014-11-15 | Disposition: A | Discharge status: Home / Self Care | Payer: Medicare Other | Source: Ambulatory Visit | Attending: Adult Health Nurse Practitioner | Admitting: Adult Health Nurse Practitioner

## 2014-11-15 DIAGNOSIS — M8448XS Pathological fracture, other site, sequela: Secondary | ICD-10-CM | POA: Diagnosis not present

## 2014-11-15 DIAGNOSIS — J439 Emphysema, unspecified: Secondary | ICD-10-CM | POA: Diagnosis not present

## 2014-11-15 DIAGNOSIS — I700 Atherosclerosis of aorta: Secondary | ICD-10-CM | POA: Diagnosis not present

## 2014-11-15 DIAGNOSIS — K8020 Calculus of gallbladder without cholecystitis without obstruction: Secondary | ICD-10-CM | POA: Insufficient documentation

## 2014-11-15 DIAGNOSIS — D7389 Other diseases of spleen: Secondary | ICD-10-CM | POA: Diagnosis not present

## 2014-11-15 DIAGNOSIS — R911 Solitary pulmonary nodule: Secondary | ICD-10-CM | POA: Diagnosis present

## 2014-11-30 ENCOUNTER — Encounter: Payer: Self-pay | Admitting: *Deleted

## 2014-11-30 ENCOUNTER — Encounter: Payer: Self-pay | Admitting: Cardiothoracic Surgery

## 2014-11-30 ENCOUNTER — Institutional Professional Consult (permissible substitution) (INDEPENDENT_AMBULATORY_CARE_PROVIDER_SITE_OTHER): Payer: Medicare Other | Admitting: Cardiothoracic Surgery

## 2014-11-30 ENCOUNTER — Other Ambulatory Visit: Payer: Self-pay | Admitting: *Deleted

## 2014-11-30 VITALS — BP 122/60 | HR 62 | Resp 20 | Ht 71.0 in | Wt 180.0 lb

## 2014-11-30 DIAGNOSIS — R911 Solitary pulmonary nodule: Secondary | ICD-10-CM

## 2014-11-30 NOTE — Progress Notes (Signed)
PCP is Sherrie Mustache, MD Referring Provider is Lissa Hoard Karie Schwalbe, RN  Chief Complaint  Patient presents with  . Lung Lesion    Surgical eval on lt lower lobe lung nodule, Chest CT 11/15/14  patient examined, CT scan of chest reviewed, previous coronary arteriogram and echocardiogram reviewed  HPI 81 year old Caucasian male ex-smoker with hypertension, peripheral vascular disease and claudication, status post CABG x2 in [place] 10 years ago -using left IMA and left radial artery grafts- now with ischemic cardiomyopathy followed by Dr. Ursula Beath 2-D echocardiogram demonstrates EF 45%. . Now presents for evaluation of a recently diagnosed pulmonary nodule in the superior segment of the left lower lobe. A year ago he had pneumonia and a CT scan was performed showing a pulmonary nodule measuring 4-5 mm. Followup CT scan almost year later shows it now measuring 7-8 mm with spiculated margins. No mediastinal adenopathy notedThe patient continues to smoke one pack per day of small cigars.the patient has limited exercise tolerance mainly related to his lower extremity claudication but also shortness of breath.  The patient denies any systemic symptoms of weight loss or fever coarseness headache or bone pain.  An initial review the  patient appears to be at increased risk for thoracotomy-pulmonary resection.  Past Medical History  Diagnosis Date  . Coronary artery disease   . Hypertension   . PAD (peripheral artery disease)   . Hypothyroidism   . Stroke 11/2008  . Pneumonia 09/2013  . Hypercholesteremia   . Solitary pulmonary nodule on lung CT 12/13/13   11/15/14  . Diabetes mellitus without complication     prediabedtic x 10 years...Marland KitchenMarland KitchenA1C 6.4%    Past Surgical History  Procedure Laterality Date  . Knee arthroscopy Right   . Trigger finger release Bilateral   . Stent to right femoral artery    . Angioplasty to left femoral artery    . Coronary artery bypass graft      x4   . Carpal tunnel release Left 05/11/2014    Procedure: LEFT CARPAL TUNNEL RELEASE;  Surgeon: Carole Civil, MD;  Location: [facility];  Service: Orthopedics;  Laterality: Left;  . Left and right heart catheterization with coronary angiogram N/A 09/29/2013    Procedure: LEFT AND RIGHT HEART CATHETERIZATION WITH CORONARY ANGIOGRAM;  Surgeon: Burnell Blanks, MD;  Location: [facility] CATH LAB;  Service: Cardiovascular;  Laterality: N/A;    Family History  Problem Relation Age of Onset  . Heart attack Father   . Diabetes Mother     Social History History  Substance Use Topics  . Smoking status: Current Every Day Smoker -- 60 years    Types: Cigars  . Smokeless tobacco: Never Used     Comment: former cigarette smoker/smoked cigarettes for 30 years  . Alcohol Use: No    Current Outpatient Prescriptions  Medication Sig Dispense Refill  . aspirin 325 MG EC tablet Take 325 mg by mouth daily.    . carvedilol (COREG) 12.5 MG tablet Take 12.5 mg by mouth 2 (two) times daily with a meal.    . ezetimibe (ZETIA) 10 MG tablet Take 1 tablet (10 mg total) by mouth daily. 30 tablet 11  . furosemide (LASIX) 40 MG tablet Take 40 mg by mouth 2 (two) times daily.    . isosorbide mononitrate (IMDUR) 30 MG 24 hr tablet Take 30 mg by mouth daily.    Marland Kitchen levothyroxine (SYNTHROID, LEVOTHROID) 25 MCG tablet Take 25 mcg by mouth daily.     . potassium  chloride (K-DUR,KLOR-CON) 10 MEQ tablet Take 1 tablet (10 mEq total) by mouth daily. 30 tablet 11   No current facility-administered medications for this visit.    No Known Allergies  Review of Systems   The patient is right-hand dominant The patient recently had a left carpal tunnel operation without any cardiac or pulmonary problems. The patient is not hospitalized for pneumonia or COPD problems in last 10 months. Patient still remains fairly active doing yard and lawn maintenance for his own properly This patient smokes 20 small cigarettes-cigars  daily  BP 122/60 mmHg  Pulse 62  Resp 20  Ht 5\' 11"  (1.803 m)  Wt 180 lb (81.647 kg)  BMI 25.12 kg/m2  SpO2 96%   Physical Exam General appearance-elderly Caucasian male appears chronically ill with COPD HEENT-normocephalic pupils equal-several missing teeth and severe, gum recession on remaining lower mandibular teeth Neck without carotid bruit or JVD, no cervical adenopathy Chest with increased AP diameter and scattered rhonchi bilaterally Cardiac with regular rhythm no murmur or gallop Abdomen soft nontender without pulsatile mass Extremities with significant clubbing of the nailbeds without cyanosis Vascular-nonpalpable pedal pulses bilaterally with atrophic skin changes of the tibial region bilaterally Neuro-normal gait no focal motor deficit   Impression: 8 mm spiculated nodular density in the superior segment of left lower lobe suspicious for early stage bronchogenic carcinoma-PET scan pending  Severe coronary and peripheral vascular disease with claudication, ischemic cardiomyopathy with moderate LV dysfunction  Probable moderate-severe COPD with active smoking, PFTs pending  Plan:we'll proceed with PET scan and formal PFTs. If the nodule is hypermetabolic then he will need either resection if PFTs are adequate or needle biopsy and SB RT -radiation therapy.  He'll return in 2 weeks following his PET scan and PFTs to review the results and to decide therapeutic options. He was strongly counseled to stop smoking. He would not be a candidate for surgical resection unless he stopped smoking for 3 weeks preoperatively

## 2014-12-05 ENCOUNTER — Ambulatory Visit (INDEPENDENT_AMBULATORY_CARE_PROVIDER_SITE_OTHER): Payer: Medicare Other | Admitting: Cardiology

## 2014-12-05 ENCOUNTER — Encounter: Payer: Self-pay | Admitting: Cardiology

## 2014-12-05 VITALS — BP 110/65 | HR 59 | Ht 71.0 in | Wt 191.0 lb

## 2014-12-05 DIAGNOSIS — I255 Ischemic cardiomyopathy: Secondary | ICD-10-CM

## 2014-12-05 DIAGNOSIS — E785 Hyperlipidemia, unspecified: Secondary | ICD-10-CM

## 2014-12-05 DIAGNOSIS — I6523 Occlusion and stenosis of bilateral carotid arteries: Secondary | ICD-10-CM

## 2014-12-05 DIAGNOSIS — I2510 Atherosclerotic heart disease of native coronary artery without angina pectoris: Secondary | ICD-10-CM

## 2014-12-05 NOTE — Patient Instructions (Signed)
Your physician recommends that you schedule a follow-up appointment in: 3 months with Dr. Branch  Your physician recommends that you continue on your current medications as directed. Please refer to the Current Medication list given to you today.  Thank you for choosing [facility]!!    

## 2014-12-05 NOTE — Progress Notes (Signed)
Clinical Summary Gary Odonnell is a 81 y.o.male seen today for follow up of the following medical problems.   1. CAD/ICM  - history of prior CABG 2003 in [place]  - 09/2013 admission to [facility] with decompensated heart failure. Workup included an echo which showed an LVEF of 15%. He was transferred to [facility] for further management.  - cath 09/2013 showed LM 30%, LAD 100% proximal, LCX 50% ostial, RCA 100% mid with distal vessel filling with right to right and left to right collaterals. LIMA-LAD patent, graft from [facility] [place] to OM1, OM2, PDA occluded. No PCI targets, recommendations for medical management. RHC mean PA 43, wedge 22, CI 1.74.  - repeat echo 04/2014 shows LVEF improved to 40-45%.   - denies any chest pain. Denies any significant SOB. No LE edema, no orthopnea, no PND   2. Hyperlipidemia  - compliant with zetia  - lipitor and crestor have caused prior muscle aches, not on statin   3. Lung lesion - being worked up by CT surgery Dr Prescott Gum, has upcoming PET scan.   4. Leg pain - 100 yards pain bilateral shin bones, left hip. No significant pain in calf or posterior thighs - no sores on feet  Past Medical History  Diagnosis Date  . Coronary artery disease   . Hypertension   . PAD (peripheral artery disease)   . Hypothyroidism   . Stroke 11/2008  . Pneumonia 09/2013  . Hypercholesteremia   . Solitary pulmonary nodule on lung CT 12/13/13   11/15/14  . Diabetes mellitus without complication     prediabedtic x 10 years...Marland KitchenMarland KitchenA1C 6.4%     No Known Allergies   Current Outpatient Prescriptions  Medication Sig Dispense Refill  . aspirin 325 MG EC tablet Take 325 mg by mouth daily.    . carvedilol (COREG) 12.5 MG tablet Take 12.5 mg by mouth 2 (two) times daily with a meal.    . ezetimibe (ZETIA) 10 MG tablet Take 1 tablet (10 mg total) by mouth daily. 30 tablet 11  . furosemide (LASIX) 40 MG tablet Take 40 mg by mouth 2 (two) times daily.    . isosorbide  mononitrate (IMDUR) 30 MG 24 hr tablet Take 30 mg by mouth daily.    Marland Kitchen levothyroxine (SYNTHROID, LEVOTHROID) 25 MCG tablet Take 25 mcg by mouth daily.     . potassium chloride (K-DUR,KLOR-CON) 10 MEQ tablet Take 1 tablet (10 mEq total) by mouth daily. 30 tablet 11   No current facility-administered medications for this visit.     Past Surgical History  Procedure Laterality Date  . Knee arthroscopy Right   . Trigger finger release Bilateral   . Stent to right femoral artery    . Angioplasty to left femoral artery    . Coronary artery bypass graft      x4  . Carpal tunnel release Left 05/11/2014    Procedure: LEFT CARPAL TUNNEL RELEASE;  Surgeon: Carole Civil, MD;  Location: [facility];  Service: Orthopedics;  Laterality: Left;  . Left and right heart catheterization with coronary angiogram N/A 09/29/2013    Procedure: LEFT AND RIGHT HEART CATHETERIZATION WITH CORONARY ANGIOGRAM;  Surgeon: Burnell Blanks, MD;  Location: [facility] CATH LAB;  Service: Cardiovascular;  Laterality: N/A;     No Known Allergies    Family History  Problem Relation Age of Onset  . Heart attack Father   . Diabetes Mother      Social History Mr. Gola  reports that he has been smoking Cigars.  He has never used smokeless tobacco. Mr. Heidinger reports that he does not drink alcohol.   Review of Systems CONSTITUTIONAL: No weight loss, fever, chills, weakness or fatigue.  HEENT: Eyes: No visual loss, blurred vision, double vision or yellow sclerae.No hearing loss, sneezing, congestion, runny nose or sore throat.  SKIN: No rash or itching.  CARDIOVASCULAR: per HPI RESPIRATORY: No shortness of breath, cough or sputum.  GASTROINTESTINAL: No anorexia, nausea, vomiting or diarrhea. No abdominal pain or blood.  GENITOURINARY: No burning on urination, no polyuria NEUROLOGICAL: No headache, dizziness, syncope, paralysis, ataxia, numbness or tingling in the extremities. No change in bowel or bladder control.    MUSCULOSKELETAL: leg pain LYMPHATICS: No enlarged nodes. No history of splenectomy.  PSYCHIATRIC: No history of depression or anxiety.  ENDOCRINOLOGIC: No reports of sweating, cold or heat intolerance. No polyuria or polydipsia.  Marland Kitchen   Physical Examination p 59 bp 110/65 Wt 191 lbs BMI 27 Gen: resting comfortably, no acute distress HEENT: no scleral icterus, pupils equal round and reactive, no palptable cervical adenopathy,  CV: RRR. No m/r/g,.  Right carotid bruit. Decreased bilateral DP/PT pulses Resp: Clear to auscultation bilaterally GI: abdomen is soft, non-tender, non-distended, normal bowel sounds, no hepatosplenomegaly MSK: extremities are warm, no edema.  Skin: warm, no rash Neuro:  no focal deficits Psych: appropriate affect   Diagnostic Studies 09/29/13 Cath  Hemodynamic Findings:  Ao: 162/70  LV: 163/19/29  RA: 12  RV: 66/7/16  PA: 67/26 (mean 43)  PCWP: 22  Fick Cardiac Output: 3.5 L/min  Fick Cardiac Index: 1.74 L/min/m2  Central Aortic Saturation: 91%  Pulmonary Artery Saturation: 50%  Angiographic Findings:  Left main: Distal 30% stenosis.  Left Anterior Descending Artery: 100% proximal occlusion. Proximal, mid and distal vessel fills from the patent IMA graft.  Circumflex Artery: Large caliber vessel with large intermediate Angeleah Labrake. Diffuse non-obstructive plaque in the large bifurcating intermediate Samyrah Bruster. The ostium of the Circumflex has 50% stenosis.  Right Coronary Artery: Large dominant vessel with 100% mid occlusion. The distal vessel fills right to right bridging collaterals and left to right collaterals.  Graft Anatomy:  LIMA to LAD is patent  Free Radial (? Y graft from LIMA) to OM1, OM2, PDA is occluded.  Left Ventricular Angiogram: Deferred.  Impression:  1. Triple vessel CAD with patent LIMA to LAD, occluded RCA with good collateral filling, moderate disease in Circumflex  2. No focal targets for PCI  3. Ischemic  cardiomyopathy with acute systolic CHF   17/6/16 Echo  LVEF 15-20%, moderate LVH, severe diffuse hypokinesis, restrictive diastolic dysfunction, multiple WMAs, moderate MR, severe RV dysfunction RV TAPSE 0.7 cm, PASP 61 mmHg.   12/2013 Lab  TC 175 TG 111 LDL 116 HDL 37  TSH 7.7 HgbA1c 6.6 Na 143 K 4.5 Cr 1.22 GFR56   04/2014 Echo Study Conclusions  - Left ventricle: The cavity size was normal. Wall thickness was increased in a pattern of mild LVH. Systolic function was mildly to moderately reduced. The estimated ejection fraction was in the range of 40% to 45%. There is diastolic dysfunction, indeterminant grade. - Regional wall motion abnormality: Hypokinesis of the mid anterior and basal-mid anteroseptal myocardium. - Aortic valve: Moderately calcified annulus. Trileaflet; moderately thickened leaflets. Valve area (VTI): 2.48 cm^2. Valve area (Vmax): 2.23 cm^2. - Mitral valve: Moderately calcified annulus. Mildly thickened leaflets . - Left atrium: The atrium was moderately dilated. - Right atrium: The atrium was mildly dilated. - Technically adequate  study.      Assessment and Plan  1. CAD/ICM  - previously severe LV systolic dysfunction, LVEF 15-20%. Repeat echo shows improved function at 40-45%. He is NYHA II. Last cath described above.  - he was on lisionpril at last visit, somehow unclear to me it has dropped off his med list. He is not sure either or if pcp may have stopped. Has upcoming appt with pcp, recommend restarting if no contraindication that pcp has detected as I did not stop his ACE-I. Borderline high K on last check, would need repeat BMET shortly after starting.    2. Hyperlipidemia  - intolerant to statins, continue zetia.   3. Leg pain - fairly atypical for claudication, he does have a history of PAD and decreased pulses on exam - may consider ABIs in near future, he has asked to defer until lung nodule workup complete  4. Carotid stenosis -  time for repeat US, will order after workup for lung nodule complete per patient request   F/u 3 months    Arnoldo Lenis, M.D.

## 2014-12-08 ENCOUNTER — Ambulatory Visit (HOSPITAL_COMMUNITY)
Admission: RE | Admit: 2014-12-08 | Discharge: 2014-12-08 | Disposition: A | Discharge status: Home / Self Care | Payer: Medicare Other | Source: Ambulatory Visit | Attending: Cardiothoracic Surgery | Admitting: Cardiothoracic Surgery

## 2014-12-08 DIAGNOSIS — R911 Solitary pulmonary nodule: Secondary | ICD-10-CM

## 2014-12-08 LAB — PULMONARY FUNCTION TEST
DL/VA % pred: 54 %
DL/VA: 2.54 ml/min/mmHg/L
DLCO unc % pred: 44 %
DLCO unc: 15.1 ml/min/mmHg
FEF 25-75 Post: 1.24 L/sec
FEF 25-75 Pre: 1.32 L/sec
FEF2575-%Change-Post: -5 %
FEF2575-%Pred-Post: 60 %
FEF2575-%Pred-Pre: 63 %
FEV1-%Change-Post: -1 %
FEV1-%Pred-Post: 74 %
FEV1-%Pred-Pre: 75 %
FEV1-Post: 2.23 L
FEV1-Pre: 2.27 L
FEV1FVC-%Change-Post: -2 %
FEV1FVC-%Pred-Pre: 92 %
FEV6-%Change-Post: 2 %
FEV6-%Pred-Post: 84 %
FEV6-%Pred-Pre: 82 %
FEV6-Post: 3.32 L
FEV6-Pre: 3.25 L
FEV6FVC-%Change-Post: 1 %
FEV6FVC-%Pred-Post: 103 %
FEV6FVC-%Pred-Pre: 102 %
FVC-%Change-Post: 0 %
FVC-%Pred-Post: 82 %
FVC-%Pred-Pre: 81 %
FVC-Post: 3.45 L
FVC-Pre: 3.41 L
Post FEV1/FVC ratio: 65 %
Post FEV6/FVC ratio: 96 %
Pre FEV1/FVC ratio: 67 %
Pre FEV6/FVC Ratio: 95 %
RV % pred: 33 %
RV: 0.91 L
TLC % pred: 57 %
TLC: 4.15 L

## 2014-12-08 LAB — GLUCOSE, CAPILLARY: Glucose-Capillary: 88 mg/dL (ref 70–99)

## 2014-12-08 MED ORDER — ALBUTEROL SULFATE (2.5 MG/3ML) 0.083% IN NEBU
2.5000 mg | INHALATION_SOLUTION | Freq: Once | RESPIRATORY_TRACT | Status: AC
  Administered 2014-12-08: 2.5 mg via RESPIRATORY_TRACT

## 2014-12-08 MED ORDER — FLUDEOXYGLUCOSE F - 18 (FDG) INJECTION
8.9900 | Freq: Once | INTRAVENOUS | Status: AC | PRN
  Administered 2014-12-08: 8.99 via INTRAVENOUS

## 2014-12-14 ENCOUNTER — Ambulatory Visit (INDEPENDENT_AMBULATORY_CARE_PROVIDER_SITE_OTHER): Payer: Medicare Other | Admitting: Cardiothoracic Surgery

## 2014-12-14 ENCOUNTER — Other Ambulatory Visit: Payer: Self-pay | Admitting: *Deleted

## 2014-12-14 ENCOUNTER — Encounter: Payer: Self-pay | Admitting: Cardiothoracic Surgery

## 2014-12-14 VITALS — BP 120/60 | HR 58 | Resp 20 | Ht 71.0 in | Wt 190.0 lb

## 2014-12-14 DIAGNOSIS — R911 Solitary pulmonary nodule: Secondary | ICD-10-CM

## 2014-12-14 DIAGNOSIS — I255 Ischemic cardiomyopathy: Secondary | ICD-10-CM

## 2014-12-14 DIAGNOSIS — R918 Other nonspecific abnormal finding of lung field: Secondary | ICD-10-CM

## 2014-12-14 NOTE — Progress Notes (Signed)
PCP is Sherrie Mustache, MD Referring Provider is Dione Housekeeper, MD  Chief Complaint  Patient presents with  . Lung Lesion    f/u to discuss PFT's and PET Scan    Gary Odonnell returns for discussion of a recently diagnosed 9 mm density superior segment left lower lobe . He continues to smoke one pack of small cigars daily.  Since last visit he has had PFTs and a PET scan.I.  reviewed the images PET scan shows hypermetabolic activity of the small nodule in the severe segment left lower lobe consistent with malignancy. There are no mediastinal nodes with hypermetabolic activity.there are no other pulmonary nodules with hypermetabolic activity. The abdomen on PET scan is negative except for some hypermetabolic activity in the mid rectum and colonoscopy is recommended  PFTs show adequate mechanics however diffusion capacity is only 44% of predicted. This is consistent with advanced emphysema.  I reviewed the most recent cardiac catheterization -2014-the patient had following his multivessel CABG 10 years ago in [place]. There is a patent LIMA to the LAD. The radial artery graft to the circumflex flexes occluded. The circumflex has moderate native disease. The native right coronary is totally occluded. A ventriculogram was not performed. Right heart catheterization demonstrates significant pulmonary hypertension with PA pressures of 65/30.   Past Medical History  Diagnosis Date  . Coronary artery disease   . Hypertension   . PAD (peripheral artery disease)   . Hypothyroidism   . Stroke 11/2008  . Pneumonia 09/2013  . Hypercholesteremia   . Solitary pulmonary nodule on lung CT 12/13/13   11/15/14  . Diabetes mellitus without complication     prediabedtic x 10 years...Marland KitchenMarland KitchenA1C 6.4%    Past Surgical History  Procedure Laterality Date  . Knee arthroscopy Right   . Trigger finger release Bilateral   . Stent to right femoral artery    . Angioplasty to left femoral artery    .  Coronary artery bypass graft      x4  . Carpal tunnel release Left 05/11/2014    Procedure: LEFT CARPAL TUNNEL RELEASE;  Surgeon: Carole Civil, MD;  Location: [facility];  Service: Orthopedics;  Laterality: Left;  . Left and right heart catheterization with coronary angiogram N/A 09/29/2013    Procedure: LEFT AND RIGHT HEART CATHETERIZATION WITH CORONARY ANGIOGRAM;  Surgeon: Burnell Blanks, MD;  Location: [facility] CATH LAB;  Service: Cardiovascular;  Laterality: N/A;    Family History  Problem Relation Age of Onset  . Heart attack Father   . Diabetes Mother     Social History History  Substance Use Topics  . Smoking status: Current Every Day Smoker -- 1.00 packs/day for 60 years    Types: Cigars, Cigarettes    Start date: 11/21/1949  . Smokeless tobacco: Never Used     Comment: former cigarette smoker/smoked cigarettes for 30 years  . Alcohol Use: No    Current Outpatient Prescriptions  Medication Sig Dispense Refill  . aspirin EC 81 MG tablet Take 81 mg by mouth daily.    . carvedilol (COREG) 12.5 MG tablet Take 12.5 mg by mouth 2 (two) times daily with a meal.    . ezetimibe (ZETIA) 10 MG tablet Take 1 tablet (10 mg total) by mouth daily. 30 tablet 11  . furosemide (LASIX) 40 MG tablet Take 40 mg by mouth 2 (two) times daily.    . isosorbide mononitrate (IMDUR) 30 MG 24 hr tablet Take 30 mg by mouth daily.    Marland Kitchen levothyroxine (  SYNTHROID, LEVOTHROID) 25 MCG tablet Take 25 mcg by mouth daily.     . potassium chloride (K-DUR,KLOR-CON) 10 MEQ tablet Take 1 tablet (10 mEq total) by mouth daily. 30 tablet 11   No current facility-administered medications for this visit.    No Known Allergies  Review of Systems  He continues to smoke.one pack per day. He denies hemoptysis or shortness of breath. He denies angina. He states he is medically compliant with his cardiac meds. No headache or bone pain He states he had a stroke 3-4 years ago. 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 Hypermetabolic activity in rectum will need colonoscopy as the patient has never had previous screening colonoscopy.  Patient is not a candidate for resection of his lung nodule do to his severe COPD with reduced diffusion capacity, active smoking, and prior history of significant ischemic cardiomyopathy  Plan: Plan schedule the patient for colonoscopy  Plan to schedule the patient for transthoracic CT directed needle biopsy of left lung nodule to establish diagnosis for later SBRT.   Patient still needs a brain scan as well to rule out metastases and finish clinical staging

## 2014-12-22 ENCOUNTER — Other Ambulatory Visit: Payer: Self-pay | Admitting: Radiology

## 2014-12-26 ENCOUNTER — Other Ambulatory Visit: Payer: Self-pay | Admitting: Radiology

## 2014-12-27 ENCOUNTER — Emergency Department (HOSPITAL_COMMUNITY)
Admission: EM | Admit: 2014-12-27 | Discharge: 2014-12-27 | Disposition: A | Discharge status: Home / Self Care | Payer: Self-pay

## 2014-12-27 ENCOUNTER — Encounter (HOSPITAL_COMMUNITY): Payer: Self-pay

## 2014-12-27 ENCOUNTER — Ambulatory Visit (HOSPITAL_COMMUNITY)
Admission: RE | Admit: 2014-12-27 | Discharge: 2014-12-27 | Disposition: A | Discharge status: Home / Self Care | Payer: Medicare Other | Source: Ambulatory Visit | Attending: Diagnostic Radiology | Admitting: Diagnostic Radiology

## 2014-12-27 ENCOUNTER — Ambulatory Visit (HOSPITAL_COMMUNITY)
Admission: RE | Admit: 2014-12-27 | Discharge: 2014-12-27 | Disposition: A | Discharge status: Home / Self Care | Payer: Medicare Other | Source: Ambulatory Visit | Attending: Cardiothoracic Surgery | Admitting: Cardiothoracic Surgery

## 2014-12-27 DIAGNOSIS — R918 Other nonspecific abnormal finding of lung field: Secondary | ICD-10-CM | POA: Diagnosis not present

## 2014-12-27 DIAGNOSIS — Z9889 Other specified postprocedural states: Secondary | ICD-10-CM

## 2014-12-27 DIAGNOSIS — Z8673 Personal history of transient ischemic attack (TIA), and cerebral infarction without residual deficits: Secondary | ICD-10-CM | POA: Diagnosis not present

## 2014-12-27 LAB — CBC
HCT: 41.2 % (ref 39.0–52.0)
Hemoglobin: 14.1 g/dL (ref 13.0–17.0)
MCH: 32.3 pg (ref 26.0–34.0)
MCHC: 34.2 g/dL (ref 30.0–36.0)
MCV: 94.5 fL (ref 78.0–100.0)
Platelets: 116 10*3/uL — ABNORMAL LOW (ref 150–400)
RBC: 4.36 MIL/uL (ref 4.22–5.81)
RDW: 13.1 % (ref 11.5–15.5)
WBC: 10.5 10*3/uL (ref 4.0–10.5)

## 2014-12-27 LAB — PROTIME-INR
INR: 1.19 (ref 0.00–1.49)
Prothrombin Time: 15.2 seconds (ref 11.6–15.2)

## 2014-12-27 LAB — APTT: aPTT: 34 seconds (ref 24–37)

## 2014-12-27 MED ORDER — FENTANYL CITRATE 0.05 MG/ML IJ SOLN
INTRAMUSCULAR | Status: AC | PRN
  Administered 2014-12-27 (×2): 25 ug via INTRAVENOUS

## 2014-12-27 MED ORDER — LIDOCAINE HCL (PF) 1 % IJ SOLN
INTRAMUSCULAR | Status: AC
  Filled 2014-12-27: qty 30

## 2014-12-27 MED ORDER — MIDAZOLAM HCL 2 MG/2ML IJ SOLN
INTRAMUSCULAR | Status: AC
  Filled 2014-12-27: qty 4

## 2014-12-27 MED ORDER — MIDAZOLAM HCL 2 MG/2ML IJ SOLN
INTRAMUSCULAR | Status: AC | PRN
  Administered 2014-12-27 (×2): 1 mg via INTRAVENOUS

## 2014-12-27 MED ORDER — FENTANYL CITRATE 0.05 MG/ML IJ SOLN
INTRAMUSCULAR | Status: AC
  Filled 2014-12-27: qty 4

## 2014-12-27 MED ORDER — SODIUM CHLORIDE 0.9 % IV SOLN
INTRAVENOUS | Status: DC
  Administered 2014-12-27: 1000 mL via INTRAVENOUS

## 2014-12-27 NOTE — Sedation Documentation (Signed)
Patient is resting comfortably. 

## 2014-12-27 NOTE — H&P (Signed)
Chief Complaint: Left  Lung mass  Referring Physician(s): Lucianne Lei Trigt,Peter  History of Present Illness: Gary Odonnell is a 81 y.o. male   Known L lung mass since 08/2013 Jan 2015--stable Dec 2015-- enlarging +PET 10/2014 Noted hypermetabolic area at rectum also--for colonoscopy soon Pt daily smoker--cigars No known Ca Poor surgical candidate secondary lung disease and Hx cardiac disease Now scheduled for bx of left lung mass  Past Medical History  Diagnosis Date  . Coronary artery disease   . Hypertension   . PAD (peripheral artery disease)   . Hypothyroidism   . Stroke 11/2008  . Pneumonia 09/2013  . Hypercholesteremia   . Solitary pulmonary nodule on lung CT 12/13/13   11/15/14  . Diabetes mellitus without complication     prediabedtic x 10 years...Marland KitchenMarland KitchenA1C 6.4%    Past Surgical History  Procedure Laterality Date  . Knee arthroscopy Right   . Trigger finger release Bilateral   . Stent to right femoral artery    . Angioplasty to left femoral artery    . Coronary artery bypass graft      x4  . Carpal tunnel release Left 05/11/2014    Procedure: LEFT CARPAL TUNNEL RELEASE;  Surgeon: Carole Civil, MD;  Location: [facility];  Service: Orthopedics;  Laterality: Left;  . Left and right heart catheterization with coronary angiogram N/A 09/29/2013    Procedure: LEFT AND RIGHT HEART CATHETERIZATION WITH CORONARY ANGIOGRAM;  Surgeon: Burnell Blanks, MD;  Location: [facility] CATH LAB;  Service: Cardiovascular;  Laterality: N/A;    Allergies: Review of patient's allergies indicates no known allergies.  Medications: Prior to Admission medications   Medication Sig Start Date End Date Taking? Authorizing Provider  aspirin EC 81 MG tablet Take 81 mg by mouth daily.   Yes Historical Provider, MD  carvedilol (COREG) 12.5 MG tablet Take 12.5 mg by mouth 2 (two) times daily with a meal.   Yes Historical Provider, MD  ezetimibe (ZETIA) 10 MG tablet Take 1 tablet (10 mg total) by  mouth daily. 10/01/13  Yes Rhonda G Barrett, PA-C  furosemide (LASIX) 40 MG tablet Take 40 mg by mouth 2 (two) times daily.   Yes Historical Provider, MD  isosorbide mononitrate (IMDUR) 30 MG 24 hr tablet Take 30 mg by mouth daily.   Yes Historical Provider, MD  levothyroxine (SYNTHROID, LEVOTHROID) 25 MCG tablet Take 25 mcg by mouth daily.    Yes Historical Provider, MD  potassium chloride (K-DUR,KLOR-CON) 10 MEQ tablet Take 1 tablet (10 mEq total) by mouth daily. 10/01/13  Yes Rhonda G Barrett, PA-C    Family History  Problem Relation Age of Onset  . Heart attack Father   . Diabetes Mother     History   Social History  . Marital Status: Married    Spouse Name: N/A    Number of Children: N/A  . Years of Education: N/A   Social History Main Topics  . Smoking status: Current Every Day Smoker -- 1.00 packs/day for 60 years    Types: Cigars, Cigarettes    Start date: 11/21/1949  . Smokeless tobacco: Never Used     Comment: former cigarette smoker/smoked cigarettes for 30 years  . Alcohol Use: No  . Drug Use: No  . Sexual Activity: None   Other Topics Concern  . None   Social History Narrative     Review of Systems: A 12 point ROS discussed and pertinent positives are indicated in the HPI above.  All other systems are  negative.  Review of Systems  Constitutional: Negative for fever, activity change, appetite change and fatigue.  Respiratory: Positive for cough and shortness of breath. Negative for chest tightness.   Cardiovascular: Negative for chest pain.  Gastrointestinal: Negative for abdominal pain.  Musculoskeletal: Positive for gait problem.  Neurological: Negative for weakness.  Psychiatric/Behavioral: Negative for behavioral problems and confusion.     Vital Signs: BP 114/42 mmHg  Pulse 73  Temp(Src) 97.4 F (36.3 C) (Oral)  Resp 20  Ht 5' 11.5" (1.816 m)  Wt 81.647 kg (180 lb)  BMI 24.76 kg/m2  SpO2 98%  Physical Exam  Constitutional: He is oriented to  person, place, and time. He appears well-nourished.  Cardiovascular: Normal rate, regular rhythm and normal heart sounds.   No murmur heard. Pulmonary/Chest: Effort normal. He has rales.  Abdominal: Soft. Bowel sounds are normal. There is no tenderness.  Musculoskeletal: Normal range of motion.  Uses cane long distance  Neurological: He is alert and oriented to person, place, and time.  Skin: Skin is warm and dry.  Psychiatric: He has a normal mood and affect. His behavior is normal. Judgment and thought content normal.  Nursing note and vitals reviewed.   Imaging: Nm Pet Image Initial (pi) Skull Base To Thigh  12/08/2014   CLINICAL DATA:  Initial treatment strategy for left lower lobe lung nodule.  EXAM: NUCLEAR MEDICINE PET SKULL BASE TO THIGH  TECHNIQUE: 9.0 MCi F-18 FDG was injected intravenously. Full-ring PET imaging was performed from the skull base to thigh after the radiotracer. CT data was obtained and used for attenuation correction and anatomic localization.  FASTING BLOOD GLUCOSE:  Value: 88 mg/dl  COMPARISON:  None.  FINDINGS: NECK  No hypermetabolic lymph nodes in the neck.  CHEST  Within the superior segment of the left lower lobe, 7 mm pulmonary nodule has associated metabolic activity with SUV max 1.7. No additional metabolic nodules are present. No nodules identified on CT. Centrilobular emphysema noted the upper lobes.  There is no hypermetabolic mediastinal lymph nodes.  ABDOMEN/PELVIS  No abnormal hypermetabolic activity within the liver, pancreas, adrenal glands, or spleen. No hypermetabolic lymph nodes in the abdomen or pelvis.  Discrete focus of hypermetabolic activity associated with the rectal wall on image 165 of fused data set. There is a asymmetric nodular thickening at this level measuring 10 mm x 15 mm on image 187 of the CT data set. This activity is intense and focal with SUV max 7.0.  Additional diverticular disease of the sigmoid colon.  There are gallstones within  the gallbladder. Large low-density right renal cyst noted.  SKELETON  No focal hypermetabolic activity to suggest skeletal metastasis.  IMPRESSION: 1. Hypermetabolic left lower lobe pulmonary nodule is concerning for malignancy with with primary differential of bronchogenic carcinoma. 2. No evidence of mediastinal nodal metastasis. 3. Focus of hypermetabolic activity within the mid rectum with associated asymmetric mucosal thickening. Recommend colonoscopy to exclude a neoplasm or pre malignant lesion.   Electronically Signed   By: Suzy Bouchard M.D.   On: 12/08/2014 12:54    Labs:  CBC:  Recent Labs  05/06/14 1003 12/27/14 0909  WBC  --  10.5  HGB 14.6 14.1  HCT 42.7 41.2  PLT  --  PENDING    COAGS: No results for input(s): INR, APTT in the last 8760 hours.  BMP:  Recent Labs  05/06/14 1003  NA 142  K 5.0  CL 102  CO2 29  GLUCOSE 98  BUN 29*  CALCIUM 10.1  CREATININE 1.31  GFRNONAA 50*  GFRAA 58*    LIVER FUNCTION TESTS: No results for input(s): BILITOT, AST, ALT, ALKPHOS, PROT, ALBUMIN in the last 8760 hours.  TUMOR MARKERS: No results for input(s): AFPTM, CEA, CA199, CHROMGRNA in the last 8760 hours.  Assessment and Plan:  Left lung mass +PET; enlarging Smoker Now scheduled for bx of same Pt aware of procedure benefits and risks including but not limited: Infection; bleeding; pneumothorax Agreeable to proceed Consent signed andin chart  Thank you for this interesting consult.  I greatly enjoyed meeting Derreon Consalvo and look forward to participating in their care.  Signed: Louan Base A 12/27/2014, 9:45 AM   I spent a total of 20 minutes face to face in clinical consultation, greater than 50% of which was counseling/coordinating care for left lung mass bx

## 2014-12-27 NOTE — Sedation Documentation (Signed)
Pt awake, stated he did not feel the sedation meds yet.

## 2014-12-27 NOTE — Sedation Documentation (Signed)
Dr. Anselm Pancoast obtaining samples. Pt tolerating well

## 2014-12-27 NOTE — Sedation Documentation (Signed)
Pt moved rolled back to stretcher on back, tolerated well.

## 2014-12-27 NOTE — Sedation Documentation (Signed)
Another sample obtained. No change in patient status. VSS

## 2014-12-27 NOTE — Sedation Documentation (Signed)
Patient denies pain and is resting comfortably.  

## 2014-12-27 NOTE — Sedation Documentation (Signed)
Wife at side awaiting transport to p16 short stay room

## 2014-12-27 NOTE — Discharge Instructions (Signed)
Needle Biopsy of Lung, Care After °Refer to this sheet in the next few weeks. These instructions provide you with information on caring for yourself after your procedure. Your health care provider may also give you more specific instructions. Your treatment has been planned according to current medical practices, but problems sometimes occur. Call your health care provider if you have any problems or questions after your procedure. °WHAT TO EXPECT AFTER THE PROCEDURE °· A bandage will be applied over the area where the needle was inserted. You may be asked to apply pressure to the bandage for several minutes to ensure there is minimal bleeding. °· In most cases, you can leave when your needle biopsy procedure is completed. Do not drive yourself home. Someone else should take you home. °· If you received an IV sedative or general anesthetic, you will be taken to a comfortable place to relax while the medicine wears off. °· If you have upcoming travel scheduled, talk to your health care provider about when it is safe to travel by air after the procedure. °HOME CARE INSTRUCTIONS °· Expect to take it easy for the rest of the day. °· Protect the area where you received the needle biopsy by keeping the bandage in place for as long as instructed. °· You may feel some mild pain or discomfort in the area, but this should stop in a day or two. °· Take medicines only as directed by your health care provider. °SEEK MEDICAL CARE IF:  °· You have pain at the biopsy site that worsens or is not helped by medicine. °· You have swelling or drainage at the needle biopsy site. °· You have a fever. °SEEK IMMEDIATE MEDICAL CARE IF:  °· You have new or worsening shortness of breath. °· You have chest pain. °· You are coughing up blood. °· You have bleeding that does not stop with pressure or a bandage. °· You develop light-headedness or fainting. °Document Released: 09/08/2007 Document Revised: 03/28/2014 Document Reviewed:  04/05/2013 °[employer]® Patient Information ©2015 [employer]. This information is not intended to replace advice given to you by your health care provider. Make sure you discuss any questions you have with your health care provider. ° °

## 2014-12-27 NOTE — Procedures (Signed)
CT guided core biopsies of left lower lobe nodule.  2 cores obtained.  No immediate complication.

## 2015-01-03 ENCOUNTER — Ambulatory Visit: Payer: Medicare Other | Admitting: Nurse Practitioner

## 2015-01-04 ENCOUNTER — Encounter: Payer: Self-pay | Admitting: Nurse Practitioner

## 2015-01-04 ENCOUNTER — Ambulatory Visit (INDEPENDENT_AMBULATORY_CARE_PROVIDER_SITE_OTHER): Payer: Medicare Other | Admitting: Nurse Practitioner

## 2015-01-04 ENCOUNTER — Ambulatory Visit: Payer: Medicare Other | Admitting: Cardiothoracic Surgery

## 2015-01-04 VITALS — BP 125/58 | HR 62 | Temp 97.4°F | Ht 71.5 in | Wt 190.2 lb

## 2015-01-04 DIAGNOSIS — R948 Abnormal results of function studies of other organs and systems: Secondary | ICD-10-CM | POA: Insufficient documentation

## 2015-01-04 DIAGNOSIS — I255 Ischemic cardiomyopathy: Secondary | ICD-10-CM

## 2015-01-04 NOTE — Patient Instructions (Signed)
1. We will call you in 1 week to see what your decision is regarding moving forward with a colonoscopy. 2. In the meantime, call us if you have any questions.

## 2015-01-04 NOTE — Progress Notes (Signed)
Primary Care Physician:  Sherrie Mustache, MD Primary Gastroenterologist:  Dr. Oneida Alar  Chief Complaint  Patient presents with  . Colonoscopy    HPI:   81 year old male presents on referral from cardiothoracic surgery. Was seen for recently diagnosed 9 mm superior segment left lower lobe density.PET scan revealed hypermetabolic activity in the lung nodule, however, also showed hypermetabolic activity in the mid-rectum. CVTS is recommending colonoscopy to further evaluate.   Patient states he has never had a colonoscopy before. Denies abdominal pain, N/V. Denies hematochezia and melena. Has a bowel movement about once a day which is typically normal/formed consistent with a 4 on the bristol stool scale. Denies fever, chills, unintended weight loss. Has had decreased appetite and decreased activity tolerance due to leg problems. Denies overt/new fatigue or weakness. Denies dysphagia. Has some difficulty swallowing/chewing which neurology has attributed to CVA 4 years ago. Denies GERD symptoms. Denies any other upper or lower GI symptoms.  Past Medical History  Diagnosis Date  . Coronary artery disease   . Hypertension   . PAD (peripheral artery disease)   . Hypothyroidism   . Stroke 11/2008  . Pneumonia 09/2013  . Hypercholesteremia   . Solitary pulmonary nodule on lung CT 12/13/13   11/15/14  . Diabetes mellitus without complication     prediabedtic x 10 years...Marland KitchenMarland KitchenA1C 6.4%    Past Surgical History  Procedure Laterality Date  . Knee arthroscopy Right   . Trigger finger release Bilateral   . Stent to right femoral artery    . Angioplasty to left femoral artery    . Coronary artery bypass graft      x4  . Carpal tunnel release Left 05/11/2014    Procedure: LEFT CARPAL TUNNEL RELEASE;  Surgeon: Carole Civil, MD;  Location: [facility];  Service: Orthopedics;  Laterality: Left;  . Left and right heart catheterization with coronary angiogram N/A 09/29/2013    Procedure: LEFT AND  RIGHT HEART CATHETERIZATION WITH CORONARY ANGIOGRAM;  Surgeon: Burnell Blanks, MD;  Location: [facility] CATH LAB;  Service: Cardiovascular;  Laterality: N/A;    Current Outpatient Prescriptions  Medication Sig Dispense Refill  . aspirin EC 81 MG tablet Take 81 mg by mouth daily.    . carvedilol (COREG) 12.5 MG tablet Take 12.5 mg by mouth 2 (two) times daily with a meal.    . ezetimibe (ZETIA) 10 MG tablet Take 1 tablet (10 mg total) by mouth daily. 30 tablet 11  . furosemide (LASIX) 40 MG tablet Take 40 mg by mouth 2 (two) times daily.    . isosorbide mononitrate (IMDUR) 30 MG 24 hr tablet Take 30 mg by mouth daily.    Marland Kitchen levothyroxine (SYNTHROID, LEVOTHROID) 25 MCG tablet Take 25 mcg by mouth daily.     . potassium chloride (K-DUR,KLOR-CON) 10 MEQ tablet Take 1 tablet (10 mEq total) by mouth daily. 30 tablet 11   No current facility-administered medications for this visit.    Allergies as of 01/04/2015  . (No Known Allergies)    Family History  Problem Relation Age of Onset  . Heart attack Father   . Diabetes Mother     History   Social History  . Marital Status: Married    Spouse Name: N/A  . Number of Children: N/A  . Years of Education: N/A   Occupational History  . Not on file.   Social History Main Topics  . Smoking status: Current Every Day Smoker -- 1.00 packs/day for 60 years  Types: Cigars, Cigarettes    Start date: 11/21/1949  . Smokeless tobacco: Never Used     Comment: former cigarette smoker/smoked cigarettes for 30 years  . Alcohol Use: No  . Drug Use: No  . Sexual Activity: Not on file   Other Topics Concern  . Not on file   Social History Narrative    Review of Systems: All negative except for HPI.  Physical Exam: BP 125/58 mmHg  Pulse 62  Temp(Src) 97.4 F (36.3 C) (Oral)  Ht 5' 11.5" (1.816 m)  Wt 190 lb 3.2 oz (86.274 kg)  BMI 26.16 kg/m2 General:   Alert and oriented. Pleasant and cooperative. Well-nourished and well-developed.    Head:  Normocephalic and atraumatic. Eyes:  Without icterus, sclera clear and conjunctiva pink.  Ears:  Normal auditory acuity. Mouth:  No deformity or lesions, oral mucosa pink. Missing several teeth. Neck:  Supple, without mass or thyromegaly. Lungs:  Rhonchi noted left lower lobe, otherwise clear. No wheezes. No distress.  Heart:  S1, S2 present without murmurs appreciated.  Abdomen:  +BS, soft, non-tender and non-distended. No HSM noted. No guarding or rebound. RUQ subcutaneous mass appreciated with is non-tender to palpation. No other masses appreciated.  Rectal:  Deferred  Msk:  Symmetrical without gross deformities. Normal posture. Extremities:  Without clubbing or edema. Neurologic:  Alert and  oriented x4;  grossly normal neurologically. Skin:  Intact without significant lesions or rashes. Cervical Nodes:  No significant cervical adenopathy. Psych:  Alert and cooperative. Normal mood and affect.     01/04/2015 1:40 PM

## 2015-01-04 NOTE — Assessment & Plan Note (Signed)
PET scan done after recent dx of singular pulmonary nodule which showed hypermetabolic region in the rectum. Also, a small mass was appreciated RUQ just under subcutaneous tissue. Patient has never had a colonoscopy before. At this time does not want to have a colonoscopy. Has agreed to discuss with his wife and allow [place] to call him in 1 week to follow-up on their final decision. Will enter note to request staff follow-up in 1 week.

## 2015-01-06 ENCOUNTER — Other Ambulatory Visit: Payer: Self-pay | Admitting: *Deleted

## 2015-01-06 DIAGNOSIS — C3431 Malignant neoplasm of lower lobe, right bronchus or lung: Secondary | ICD-10-CM

## 2015-01-09 ENCOUNTER — Ambulatory Visit (HOSPITAL_COMMUNITY): Payer: Medicare Other

## 2015-01-10 ENCOUNTER — Ambulatory Visit (HOSPITAL_COMMUNITY)
Admission: RE | Admit: 2015-01-10 | Discharge: 2015-01-10 | Disposition: A | Discharge status: Home / Self Care | Payer: Medicare Other | Source: Ambulatory Visit | Attending: Cardiothoracic Surgery | Admitting: Cardiothoracic Surgery

## 2015-01-10 ENCOUNTER — Telehealth: Payer: Self-pay | Admitting: Gastroenterology

## 2015-01-10 DIAGNOSIS — C3431 Malignant neoplasm of lower lobe, right bronchus or lung: Secondary | ICD-10-CM | POA: Diagnosis present

## 2015-01-10 LAB — POCT I-STAT CREATININE: Creatinine, Ser: 1.1 mg/dL (ref 0.50–1.35)

## 2015-01-10 MED ORDER — SODIUM CHLORIDE 0.9 % IJ SOLN
INTRAMUSCULAR | Status: AC
  Filled 2015-01-10: qty 500

## 2015-01-10 MED ORDER — IOHEXOL 300 MG/ML  SOLN
80.0000 mL | Freq: Once | INTRAMUSCULAR | Status: AC | PRN
  Administered 2015-01-10: 80 mL via INTRAVENOUS

## 2015-01-10 NOTE — Telephone Encounter (Signed)
PATIENT CALLED TO SCHEDULE COLONOSCOPY

## 2015-01-11 ENCOUNTER — Other Ambulatory Visit: Payer: Self-pay

## 2015-01-11 DIAGNOSIS — R948 Abnormal results of function studies of other organs and systems: Secondary | ICD-10-CM

## 2015-01-11 MED ORDER — PEG-KCL-NACL-NASULF-NA ASC-C 100 G PO SOLR: 1.0000 | ORAL | Status: DC

## 2015-01-11 NOTE — Telephone Encounter (Signed)
Pt was seen here on 01/04/2015. Routing to Clinical Pool to schedule.

## 2015-01-11 NOTE — Telephone Encounter (Signed)
Pt is set up for 01/24/15 @ 1:30. He is aware of date and time. Instructions are in the mail.

## 2015-01-17 NOTE — Progress Notes (Signed)
cc'ed to pcp °

## 2015-01-24 ENCOUNTER — Encounter (HOSPITAL_COMMUNITY): Payer: Self-pay | Admitting: *Deleted

## 2015-01-24 ENCOUNTER — Encounter (HOSPITAL_COMMUNITY)
Admission: RE | Disposition: A | Discharge status: Home / Self Care | Payer: Self-pay | Source: Ambulatory Visit | Attending: Gastroenterology

## 2015-01-24 ENCOUNTER — Ambulatory Visit (HOSPITAL_COMMUNITY)
Admission: RE | Admit: 2015-01-24 | Discharge: 2015-01-24 | Disposition: A | Discharge status: Home / Self Care | Payer: Medicare Other | Source: Ambulatory Visit | Attending: Gastroenterology | Admitting: Gastroenterology

## 2015-01-24 DIAGNOSIS — E780 Pure hypercholesterolemia: Secondary | ICD-10-CM | POA: Insufficient documentation

## 2015-01-24 DIAGNOSIS — D125 Benign neoplasm of sigmoid colon: Secondary | ICD-10-CM | POA: Diagnosis not present

## 2015-01-24 DIAGNOSIS — I739 Peripheral vascular disease, unspecified: Secondary | ICD-10-CM | POA: Insufficient documentation

## 2015-01-24 DIAGNOSIS — Z79899 Other long term (current) drug therapy: Secondary | ICD-10-CM | POA: Insufficient documentation

## 2015-01-24 DIAGNOSIS — R948 Abnormal results of function studies of other organs and systems: Secondary | ICD-10-CM

## 2015-01-24 DIAGNOSIS — E039 Hypothyroidism, unspecified: Secondary | ICD-10-CM | POA: Insufficient documentation

## 2015-01-24 DIAGNOSIS — J189 Pneumonia, unspecified organism: Secondary | ICD-10-CM | POA: Insufficient documentation

## 2015-01-24 DIAGNOSIS — Z86010 Personal history of colonic polyps: Secondary | ICD-10-CM | POA: Diagnosis present

## 2015-01-24 DIAGNOSIS — Z8673 Personal history of transient ischemic attack (TIA), and cerebral infarction without residual deficits: Secondary | ICD-10-CM | POA: Diagnosis not present

## 2015-01-24 DIAGNOSIS — D127 Benign neoplasm of rectosigmoid junction: Secondary | ICD-10-CM | POA: Insufficient documentation

## 2015-01-24 DIAGNOSIS — Z951 Presence of aortocoronary bypass graft: Secondary | ICD-10-CM | POA: Insufficient documentation

## 2015-01-24 DIAGNOSIS — I10 Essential (primary) hypertension: Secondary | ICD-10-CM | POA: Diagnosis not present

## 2015-01-24 DIAGNOSIS — D128 Benign neoplasm of rectum: Secondary | ICD-10-CM | POA: Insufficient documentation

## 2015-01-24 DIAGNOSIS — D123 Benign neoplasm of transverse colon: Secondary | ICD-10-CM | POA: Diagnosis not present

## 2015-01-24 DIAGNOSIS — K5730 Diverticulosis of large intestine without perforation or abscess without bleeding: Secondary | ICD-10-CM | POA: Diagnosis not present

## 2015-01-24 DIAGNOSIS — D122 Benign neoplasm of ascending colon: Secondary | ICD-10-CM | POA: Diagnosis not present

## 2015-01-24 DIAGNOSIS — Z7982 Long term (current) use of aspirin: Secondary | ICD-10-CM | POA: Diagnosis not present

## 2015-01-24 DIAGNOSIS — I2510 Atherosclerotic heart disease of native coronary artery without angina pectoris: Secondary | ICD-10-CM | POA: Insufficient documentation

## 2015-01-24 DIAGNOSIS — D124 Benign neoplasm of descending colon: Secondary | ICD-10-CM

## 2015-01-24 DIAGNOSIS — R911 Solitary pulmonary nodule: Secondary | ICD-10-CM | POA: Diagnosis not present

## 2015-01-24 HISTORY — PX: COLONOSCOPY: SHX5424

## 2015-01-24 SURGERY — COLONOSCOPY
Anesthesia: Moderate Sedation

## 2015-01-24 MED ORDER — STERILE WATER FOR IRRIGATION IR SOLN
Status: DC | PRN
  Administered 2015-01-24: 14:00:00

## 2015-01-24 MED ORDER — SODIUM CHLORIDE 0.9 % IV SOLN
INTRAVENOUS | Status: DC
  Administered 2015-01-24: 13:00:00 via INTRAVENOUS

## 2015-01-24 MED ORDER — MIDAZOLAM HCL 5 MG/5ML IJ SOLN
INTRAMUSCULAR | Status: AC
  Filled 2015-01-24: qty 10

## 2015-01-24 MED ORDER — MIDAZOLAM HCL 5 MG/5ML IJ SOLN
INTRAMUSCULAR | Status: DC | PRN
  Administered 2015-01-24 (×2): 2 mg via INTRAVENOUS

## 2015-01-24 MED ORDER — MEPERIDINE HCL 100 MG/ML IJ SOLN
INTRAMUSCULAR | Status: DC | PRN
  Administered 2015-01-24 (×2): 25 mg via INTRAVENOUS

## 2015-01-24 MED ORDER — EPINEPHRINE HCL 0.1 MG/ML IJ SOSY
PREFILLED_SYRINGE | INTRAMUSCULAR | Status: AC
  Filled 2015-01-24: qty 10

## 2015-01-24 MED ORDER — MEPERIDINE HCL 100 MG/ML IJ SOLN
INTRAMUSCULAR | Status: DC
Start: 2015-01-24 — End: 2015-01-24
  Filled 2015-01-24: qty 2

## 2015-01-24 NOTE — H&P (Signed)
Primary Care Physician:  Sherrie Mustache, MD Primary Gastroenterologist:  Dr. Oneida Alar  Pre-Procedure History & Physical: HPI:  Gary Odonnell is a 81 y.o. male here for Abnormal PET scan-ACTIVITY IN RECTUM.  Past Medical History  Diagnosis Date  . Coronary artery disease   . Hypertension   . PAD (peripheral artery disease)   . Hypothyroidism   . Stroke 11/2008  . Pneumonia 09/2013  . Hypercholesteremia   . Solitary pulmonary nodule on lung CT 12/13/13   11/15/14    Past Surgical History  Procedure Laterality Date  . Knee arthroscopy Right   . Trigger finger release Bilateral   . Stent to right femoral artery    . Angioplasty to left femoral artery    . Coronary artery bypass graft      x4  . Carpal tunnel release Left 05/11/2014    Procedure: LEFT CARPAL TUNNEL RELEASE;  Surgeon: Carole Civil, MD;  Location: [facility];  Service: Orthopedics;  Laterality: Left;  . Left and right heart catheterization with coronary angiogram N/A 09/29/2013    Procedure: LEFT AND RIGHT HEART CATHETERIZATION WITH CORONARY ANGIOGRAM;  Surgeon: Burnell Blanks, MD;  Location: [facility] CATH LAB;  Service: Cardiovascular;  Laterality: N/A;    Prior to Admission medications   Medication Sig Start Date End Date Taking? Authorizing Provider  aspirin EC 81 MG tablet Take 81 mg by mouth daily.   Yes Historical Provider, MD  carvedilol (COREG) 12.5 MG tablet Take 12.5 mg by mouth 2 (two) times daily with a meal.   Yes Historical Provider, MD  ezetimibe (ZETIA) 10 MG tablet Take 1 tablet (10 mg total) by mouth daily. 10/01/13  Yes Rhonda G Barrett, PA-C  furosemide (LASIX) 40 MG tablet Take 40 mg by mouth 2 (two) times daily.   Yes Historical Provider, MD  isosorbide mononitrate (IMDUR) 30 MG 24 hr tablet Take 30 mg by mouth daily.   Yes Historical Provider, MD  levothyroxine (SYNTHROID, LEVOTHROID) 100 MCG tablet Take 100 mcg by mouth daily. 12/19/14  Yes Historical Provider, MD  peg 3350 powder  (MOVIPREP) 100 G SOLR Take 1 kit (200 g total) by mouth as directed. 01/11/15  Yes Danie Binder, MD  potassium chloride (K-DUR,KLOR-CON) 10 MEQ tablet Take 1 tablet (10 mEq total) by mouth daily. 10/01/13  Yes Rhonda G Barrett, PA-C    Allergies as of 01/11/2015  . (No Known Allergies)    Family History  Problem Relation Age of Onset  . Heart attack Father   . Diabetes Mother   . Colon cancer Neg Hx     History   Social History  . Marital Status: Married    Spouse Name: N/A  . Number of Children: N/A  . Years of Education: N/A   Occupational History  . Not on file.   Social History Main Topics  . Smoking status: Current Every Day Smoker -- 1.00 packs/day for 60 years    Types: Cigars, Cigarettes    Start date: 11/21/1949  . Smokeless tobacco: Never Used     Comment: former cigarette smoker/smoked cigarettes for 30 years  . Alcohol Use: No  . Drug Use: No  . Sexual Activity: Not on file   Other Topics Concern  . Not on file   Social History Narrative    Review of Systems: See HPI, otherwise negative ROS   Physical Exam: BP 153/62 mmHg  Pulse 56  Temp(Src) 98 F (36.7 C) (Oral)  Ht 5' 11.5" (1.816 m)  Wt 190 lb (86.183 kg)  BMI 26.13 kg/m2  SpO2 97% General:   Alert,  pleasant and cooperative in NAD Head:  Normocephalic and atraumatic. Neck:  Supple; Lungs:  Clear throughout to auscultation.    Heart:  Regular rate and rhythm. Abdomen:  Soft, nontender and nondistended. Normal bowel sounds, without guarding, and without rebound.   Neurologic:  Alert and  oriented x4;  grossly normal neurologically.  Impression/Plan:    Abnormal PET scan  Plan:  TCS TODAY

## 2015-01-24 NOTE — Progress Notes (Signed)
REVIEWED-NO ADDITIONAL RECOMMENDATIONS. 

## 2015-01-24 NOTE — Discharge Instructions (Signed)
You had 11 polyps removed. THE LARGE POLYP PICKED UP ON THE PET SCAN WAS REMOVED. You have small internal hemorrhoids and diverticulosis IN YOUR RIGHT AND LEFT COLON.   HOLD ASPIRIN FOR 7 DAYS.  FOLLOW A HIGH FIBER DIET. AVOID ITEMS THAT CAUSE BLOATING. SEE INFO BELOW.  YOUR BIOPSY RESULTS WILL BE AVAILABLE IN MY CHART AFTER MAR 3  OR MY OFFICE WILL CONTACT YOU IN 10-14 DAYS WITH YOUR RESULTS.   Next colonoscopy in 1-3 years.   Colonoscopy Care After Read the instructions outlined below and refer to this sheet in the next week. These discharge instructions provide you with general information on caring for yourself after you leave the hospital. While your treatment has been planned according to the most current medical practices available, unavoidable complications occasionally occur. If you have any problems or questions after discharge, call DR. Honour Schwieger, [phone].  ACTIVITY  You may resume your regular activity, but move at a slower pace for the next 24 hours.   Take frequent rest periods for the next 24 hours.   Walking will help get rid of the air and reduce the bloated feeling in your belly (abdomen).   No driving for 24 hours (because of the medicine (anesthesia) used during the test).   You may shower.   Do not sign any important legal documents or operate any machinery for 24 hours (because of the anesthesia used during the test).    NUTRITION  Drink plenty of fluids.   You may resume your normal diet as instructed by your doctor.   Begin with a light meal and progress to your normal diet. Heavy or fried foods are harder to digest and may make you feel sick to your stomach (nauseated).   Avoid alcoholic beverages for 24 hours or as instructed.    MEDICATIONS  You may resume your normal medications.   WHAT YOU CAN EXPECT TODAY  Some feelings of bloating in the abdomen.   Passage of more gas than usual.   Spotting of blood in your stool or on the toilet  paper  .  IF YOU HAD POLYPS REMOVED DURING THE COLONOSCOPY:  Eat a soft diet IF YOU HAVE NAUSEA, BLOATING, ABDOMINAL PAIN, OR VOMITING.    FINDING OUT THE RESULTS OF YOUR TEST Not all test results are available during your visit. DR. Oneida Alar WILL CALL YOU WITHIN 14 DAYS OF YOUR PROCEDUE WITH YOUR RESULTS. Do not assume everything is normal if you have not heard from DR. Ryman Rathgeber, CALL HER OFFICE AT [phone].  SEEK IMMEDIATE MEDICAL ATTENTION AND CALL THE OFFICE: [phone] IF:  You have more than a spotting of blood in your stool.   Your belly is swollen (abdominal distention).   You are nauseated or vomiting.   You have a temperature over 101F.   You have abdominal pain or discomfort that is severe or gets worse throughout the day.  Polyps, Colon  A polyp is extra tissue that grows inside your body. Colon polyps grow in the large intestine. The large intestine, also called the colon, is part of your digestive system. It is a long, hollow tube at the end of your digestive tract where your body makes and stores stool. Most polyps are not dangerous. They are benign. This means they are not cancerous. But over time, some types of polyps can turn into cancer. Polyps that are smaller than a pea are usually not harmful. But larger polyps could someday become or may already be cancerous. To be  safe, doctors remove all polyps and test them.   WHO GETS POLYPS? Anyone can get polyps, but certain people are more likely than others. You may have a greater chance of getting polyps if:  You are over 50.   You have had polyps before.   Someone in your family has had polyps.   Someone in your family has had cancer of the large intestine.   Find out if someone in your family has had polyps. You may also be more likely to get polyps if you:   Eat a lot of fatty foods   Smoke   Drink alcohol   Do not exercise  Eat too much   TREATMENT  The caregiver will remove the polyp during  sigmoidoscopy or colonoscopy.  PREVENTION There is not one sure way to prevent polyps. You might be able to lower your risk of getting them if you:  Eat more fruits and vegetables and less fatty food.   Do not smoke.   Avoid alcohol.   Exercise every day.   Lose weight if you are overweight.   Eating more calcium and folate can also lower your risk of getting polyps. Some foods that are rich in calcium are milk, cheese, and broccoli. Some foods that are rich in folate are chickpeas, kidney beans, and spinach.   High-Fiber Diet A high-fiber diet changes your normal diet to include more whole grains, legumes, fruits, and vegetables. Changes in the diet involve replacing refined carbohydrates with unrefined foods. The calorie level of the diet is essentially unchanged. The Dietary Reference Intake (recommended amount) for adult males is 38 grams per day. For adult females, it is 25 grams per day. Pregnant and lactating women should consume 28 grams of fiber per day. Fiber is the intact part of a plant that is not broken down during digestion. Functional fiber is fiber that has been isolated from the plant to provide a beneficial effect in the body. PURPOSE  Increase stool bulk.   Ease and regulate bowel movements.   Lower cholesterol.  INDICATIONS THAT YOU NEED MORE FIBER  Constipation and hemorrhoids.   Uncomplicated diverticulosis (intestine condition) and irritable bowel syndrome.   Weight management.   As a protective measure against hardening of the arteries (atherosclerosis), diabetes, and cancer.   GUIDELINES FOR INCREASING FIBER IN THE DIET  Start adding fiber to the diet slowly. A gradual increase of about 5 more grams (2 slices of whole-wheat bread, 2 servings of most fruits or vegetables, or 1 bowl of high-fiber cereal) per day is best. Too rapid an increase in fiber may result in constipation, flatulence, and bloating.   Drink enough water and fluids to keep your  urine clear or pale yellow. Water, juice, or caffeine-free drinks are recommended. Not drinking enough fluid may cause constipation.   Eat a variety of high-fiber foods rather than one type of fiber.   Try to increase your intake of fiber through using high-fiber foods rather than fiber pills or supplements that contain small amounts of fiber.   The goal is to change the types of food eaten. Do not supplement your present diet with high-fiber foods, but replace foods in your present diet.  INCLUDE A VARIETY OF FIBER SOURCES  Replace refined and processed grains with whole grains, canned fruits with fresh fruits, and incorporate other fiber sources. White rice, white breads, and most bakery goods contain little or no fiber.   Brown whole-grain rice, buckwheat oats, and many fruits and vegetables  are all good sources of fiber. These include: broccoli, Brussels sprouts, cabbage, cauliflower, beets, sweet potatoes, white potatoes (skin on), carrots, tomatoes, eggplant, squash, berries, fresh fruits, and dried fruits.   Cereals appear to be the richest source of fiber. Cereal fiber is found in whole grains and bran. Bran is the fiber-rich outer coat of cereal grain, which is largely removed in refining. In whole-grain cereals, the bran remains. In breakfast cereals, the largest amount of fiber is found in those with "bran" in their names. The fiber content is sometimes indicated on the label.   You may need to include additional fruits and vegetables each day.   In baking, for 1 cup white flour, you may use the following substitutions:   1 cup whole-wheat flour minus 2 tablespoons.   1/2 cup white flour plus 1/2 cup whole-wheat flour.   Diverticulosis Diverticulosis is a common condition that develops when small pouches (diverticula) form in the wall of the colon. The risk of diverticulosis increases with age. It happens more often in people who eat a low-fiber diet. Most individuals with  diverticulosis have no symptoms. Those individuals with symptoms usually experience belly (abdominal) pain, constipation, or loose stools (diarrhea).  HOME CARE INSTRUCTIONS  Increase the amount of fiber in your diet as directed by your caregiver or dietician. This may reduce symptoms of diverticulosis.   Drink at least 6 to 8 glasses of water each day to prevent constipation.   Try not to strain when you have a bowel movement.   Avoiding nuts and seeds to prevent complications is still an uncertain benefit.       FOODS HAVING HIGH FIBER CONTENT INCLUDE:  Fruits. Apple, peach, pear, tangerine, raisins, prunes.   Vegetables. Brussels sprouts, asparagus, broccoli, cabbage, carrot, cauliflower, romaine lettuce, spinach, summer squash, tomato, winter squash, zucchini.   Starchy Vegetables. Baked beans, kidney beans, lima beans, split peas, lentils, potatoes (with skin).   Grains. Whole wheat bread, brown rice, bran flake cereal, plain oatmeal, white rice, shredded wheat, bran muffins.    SEEK IMMEDIATE MEDICAL CARE IF:  You develop increasing pain or severe bloating.   You have an oral temperature above 101F.   You develop vomiting or bowel movements that are bloody or black.   Hemorrhoids Hemorrhoids are dilated (enlarged) veins around the rectum. Sometimes clots will form in the veins. This makes them swollen and painful. These are called thrombosed hemorrhoids. Causes of hemorrhoids include:  Constipation.   Straining to have a bowel movement.   HEAVY LIFTING HOME CARE INSTRUCTIONS  Eat a well balanced diet and drink 6 to 8 glasses of water every day to avoid constipation. You may also use a bulk laxative.   Avoid straining to have bowel movements.   Keep anal area dry and clean.   Do not use a donut shaped pillow or sit on the toilet for long periods. This increases blood pooling and pain.   Move your bowels when your body has the urge; this will require less  straining and will decrease pain and pressure.

## 2015-01-25 ENCOUNTER — Telehealth: Payer: Self-pay | Admitting: Cardiology

## 2015-01-25 NOTE — Telephone Encounter (Signed)
Called Gary Odonnell unable to get an answer in reference to 1 year Cartoid Doppler study follow up per Vascular report of study done 10/20/2013 Please see report. Mailed recall letter to patient.

## 2015-01-25 NOTE — Op Note (Signed)
NAME:  ANSEL, FERRALL NO.:  [id number]  MEDICAL RECORD NO.:  [id number]  LOCATION:  [facility]                          FACILITY:  [facility]  PHYSICIAN:  Barney Drain, M.D.     DATE OF BIRTH:  07/24/1932  DATE OF PROCEDURE:  01/24/2015 DATE OF DISCHARGE:  01/24/2015                              OPERATIVE REPORT  FINAL PATH REPORT: SIMPLE ADENOMAS(10), NEXT FLEX SIG OR TCS IN 3 YEARS    REFERRING PROVIDERS:  Margarita Rana, M.D., Ivin Poot, M.D.  PROCEDURE:  Colonoscopy with cold forceps and snare cautery polypectomy with submucosal injection of epinephrine (2 mL) and Spot (5 mL).  INDICATION FOR EXAM:  Mr. Craton is an 81 year old male who has never had a colonoscopy.  He presented to Dr. Kerby Less with a lung nodule.  His PET scan in January 2016, showed a lesion in the rectum.  FINDINGS: 1. Redundant left colon. 2. Three 6 mm sessile ascending colon polyps removed via snare     cautery.  Two 6 mm sessile transverse colon polyps removed via     snare cautery.  One 3 mm sessile descending colon polyp removed via     cold forceps.  Two 6 mm sessile rectosigmoid polyps removed via     snare cautery.  One slightly pedunculated rectosigmoid polyp     removed via snare cautery. 3. One sessile 6 mm rectal polyp removed via snare cautery.  One 1.2     cm sessile rectal polyp removed via snare cautery.  Prior to     removal, 2 mL of epinephrine was placed to prevent bleeding.  The     site was marked with Spot tattoo (5 mL). 4. Rare diverticula seen in the transverse colon.  Frequent     diverticula seen in the sigmoid colon with tortuosity, angulation,     and redundant and muscular hypertrophy with luminal narrowing. 5. Normal retroflexed view of the rectum. 6. Normal terminal ileum, approximately 5 cm visualized.  IMPRESSION: 1. Eleven colon polyps removed.  The large rectal polyp seen on PET     scan has been removed. 2. Mild diverticulosis.  CECAL WITHDRAWAL  TIME:  59 minutes.  RECOMMENDATIONS: 1. Hold aspirin for 7 days. 2. Follow a high-fiber diet.  Avoid items that cause bloating. 3. Await biopsies. 4. Colonoscopy in 1 to 3 years.  Would consider flexible sigmoidoscopy     if no advanced polyp seen in the proximal colon.  MEDICATIONS: 1. Demerol 50 mg IV. 2. Versed 4 mg IV.  PROCEDURE TECHNIQUE:  Physical exam was performed.  Informed consent was obtained with the patient, explained the benefits, risks, and alternatives to the procedure.  The patient was connected to monitor and placed in the left lateral position.  Continuous oxygen was provided by nasal cannula.  IV medicine administered through an indwelling cannula. After administration of sedation and rectal exam, the patient's rectum was intubated, scope was advanced under direct visualization to the cecum.  The scope was removed slowly by careful examining the color, texture, anatomy, and integrity of the mucosa on the way out.  The patient was recovered in endoscopy and discharged home in  satisfactory condition.     Barney Drain, M.D.     SF/MEDQ  D:  01/24/2015  T:  01/25/2015  Job:  [id number]  cc:   Ivin Poot, M.D. [address] [place] [address]

## 2015-01-27 ENCOUNTER — Other Ambulatory Visit: Payer: Self-pay | Admitting: *Deleted

## 2015-01-28 ENCOUNTER — Telehealth: Payer: Self-pay | Admitting: Gastroenterology

## 2015-01-28 NOTE — Telephone Encounter (Signed)
Please call pt. HE had TEN simple adenomas removed. FOLLOW A HIGH FIBER DIET. NEXT TCS IN 3 YEARS.

## 2015-01-30 ENCOUNTER — Ambulatory Visit: Payer: Medicare Other | Admitting: Cardiothoracic Surgery

## 2015-01-30 ENCOUNTER — Telehealth: Payer: Self-pay | Admitting: Gastroenterology

## 2015-01-30 NOTE — Telephone Encounter (Signed)
See results note. Pt is aware of results.

## 2015-01-30 NOTE — Telephone Encounter (Signed)
L/M to call.

## 2015-01-30 NOTE — Telephone Encounter (Signed)
PATIENT RETURNED CALL, PLEASE CALL BACK  °

## 2015-01-30 NOTE — Telephone Encounter (Signed)
Pt is aware of results. 

## 2015-01-30 NOTE — Telephone Encounter (Signed)
ON RECALL FOR TCS

## 2015-02-02 ENCOUNTER — Encounter (HOSPITAL_COMMUNITY): Payer: Self-pay | Admitting: Gastroenterology

## 2015-02-02 ENCOUNTER — Ambulatory Visit: Payer: Medicare Other | Attending: Radiation Oncology | Admitting: Physical Therapy

## 2015-02-02 ENCOUNTER — Other Ambulatory Visit: Payer: Self-pay | Admitting: *Deleted

## 2015-02-02 ENCOUNTER — Ambulatory Visit
Admission: RE | Admit: 2015-02-02 | Discharge: 2015-02-02 | Disposition: A | Discharge status: Home / Self Care | Payer: Medicare Other | Source: Ambulatory Visit | Attending: Radiation Oncology | Admitting: Radiation Oncology

## 2015-02-02 ENCOUNTER — Encounter: Payer: Self-pay | Admitting: *Deleted

## 2015-02-02 VITALS — BP 124/59 | HR 61 | Temp 98.2°F | Resp 19 | Wt 187.2 lb

## 2015-02-02 DIAGNOSIS — C3432 Malignant neoplasm of lower lobe, left bronchus or lung: Secondary | ICD-10-CM | POA: Insufficient documentation

## 2015-02-02 DIAGNOSIS — R293 Abnormal posture: Secondary | ICD-10-CM | POA: Diagnosis present

## 2015-02-02 DIAGNOSIS — Z7409 Other reduced mobility: Secondary | ICD-10-CM | POA: Diagnosis not present

## 2015-02-02 DIAGNOSIS — R911 Solitary pulmonary nodule: Secondary | ICD-10-CM

## 2015-02-02 HISTORY — DX: Malignant neoplasm of lower lobe, left bronchus or lung: C34.32

## 2015-02-02 NOTE — Therapy (Signed)
[place], [place], [address] Phone: [phone]   Fax:  [phone]  Physical Therapy Evaluation  Patient Details  Name: Gary Odonnell MRN: [id number] Date of Birth: 1932/07/27 Referring Provider:  Dione Housekeeper, MD  Encounter Date: 02/02/2015      PT End of Session - 02/02/15 1518    Visit Number 1   Number of Visits 1   Date for PT Re-Evaluation 04/03/15   PT Start Time 1440   PT Stop Time 1500   PT Time Calculation (min) 20 min   Activity Tolerance Patient tolerated treatment well   Behavior During Therapy [name] for tasks assessed/performed      Past Medical History  Diagnosis Date  . Coronary artery disease   . Hypertension   . PAD (peripheral artery disease)   . Hypothyroidism   . Stroke 11/2008  . Pneumonia 09/2013  . Hypercholesteremia   . Solitary pulmonary nodule on lung CT 12/13/13   11/15/14    Past Surgical History  Procedure Laterality Date  . Knee arthroscopy Right   . Trigger finger release Bilateral   . Stent to right femoral artery    . Angioplasty to left femoral artery    . Coronary artery bypass graft      x4  . Carpal tunnel release Left 05/11/2014    Procedure: LEFT CARPAL TUNNEL RELEASE;  Surgeon: Carole Civil, MD;  Location: [facility] ORS;  Service: Orthopedics;  Laterality: Left;  . Left and right heart catheterization with coronary angiogram N/A 09/29/2013    Procedure: LEFT AND RIGHT HEART CATHETERIZATION WITH CORONARY ANGIOGRAM;  Surgeon: Burnell Blanks, MD;  Location: [facility] CATH LAB;  Service: Cardiovascular;  Laterality: N/A;  . Colonoscopy N/A 01/24/2015    Procedure: COLONOSCOPY;  Surgeon: Danie Binder, MD;  Location: [facility] ENDO SUITE;  Service: Endoscopy;  Laterality: N/A;  130    There were no vitals filed for this visit.  Visit Diagnosis:  Posture abnormality - Plan: PT plan of care cert/re-cert  Reduced mobility - Plan: PT plan of care  cert/re-cert      Subjective Assessment - 02/02/15 1506    Symptoms Pt. presented earlier with pneumonia.    Pertinent History Workup showed left lower lobe mass.  01/24/15 had colonoscopy due to hypermetabolic area in rectum on PET, which was found to be negative for cancer.  Current smoker; CABG x 2; CHF, HTN.  Stage I adenocarcinoma of lung; patient expected to have SBRT. 12-14 year h/o leg pain; had right leg stented and left leg angioplasty; says he needs this repeated; thighs and legs get tired after 3 minutes.                                                                  [name] PT Assessment - 02/02/15 0001    Assessment   Medical Diagnosis left lower lobe adenocarcinoma, stage I   Onset Date 11/20/14   Precautions   Precautions Other (comment)  cancer precautions   Restrictions   Weight Bearing Restrictions No   Balance Screen   Has the patient fallen in the past 6 months No   Has the patient had a decrease in activity level because of a fear of falling?  No  Is the patient reluctant to leave their home because of a fear of falling?  No   Home Environment   Living Enviornment Private residence   Living Arrangements Spouse/significant other  one of his patient's daughters was with him today   Type of [name] One level   Prior Function   Level of Independence Independent with basic ADLs;Independent with homemaking with ambulation;Independent with gait   Observation/Other Assessments   Observations gregarious older man   Sensation   Light Touch Not tested  denies foot numbness or tingling; hand and left face numbnes   Functional Tests   Functional tests Sit to Stand  8 times in 30 seconds, with slight leg discomfort afterward   Posture/Postural Control   Posture/Postural Control Postural limitations   Postural Limitations Flexed trunk  slight   ROM / Strength   AROM / PROM / Strength AROM;Strength   AROM   Overall AROM Comments standing trunk  AROM WFL except in extension, where patient only moved through approx. 5-10% of normal range from neutral erect position   Strength   Overall Strength Other (comment)   Overall Strength Comments reports good strength and function   Ambulation/Gait   Ambulation/Gait Yes   Ambulation/Gait Assistance 6: Modified independent (Device/Increase time)   Ambulation Distance (Feet) --  reports he is limited to 50-100 feet due to leg discomfort   Assistive device None   Balance   Balance Assessed Yes   Dynamic Standing Balance   Dynamic Standing - Comments reaches 10 inches forward in standing, then had slight loss of balance                           PT Education - 02-10-2015 1518    Education provided Yes   Education Details posture, breathing, walking, energy conservation   Person(s) Educated Patient;Child(ren)   Methods Explanation;Handout   Comprehension Verbalized understanding               Lung Clinic Goals - Feb 10, 2015 1525    Patient will be able to verbalize understanding of the benefit of exercise to decrease fatigue.   Status Achieved   Patient will be able to verbalize the importance of posture.   Status Achieved   Patient will be able to demonstrate diaphragmatic breathing for improved lung function.   Status Achieved   Patient will be able to verbalize understanding of the role of physical therapy to prevent functional decline and who to contact if physical therapy is needed.   Status Achieved             Plan - Feb 10, 2015 1520    Clinical Impression Statement Patient with mobility limitations from leg claudication; also has postural impairment.   Pt will benefit from skilled therapeutic intervention in order to improve on the following deficits Decreased endurance;Difficulty walking;Decreased range of motion   Rehab Potential Fair   PT Frequency One time visit   PT Treatment/Interventions Patient/family education   PT Next Visit Plan None at  this time.   PT Home Exercise Plan See education section.   Consulted and Agree with Plan of Care Patient;Family member/caregiver          G-Codes - 02/10/2015 1526    Functional Assessment Tool Used clinical judgement   Functional Limitation Mobility: Walking and moving around   Mobility: Walking and Moving Around Current Status ([id number]) At least 60 percent but less than 80 percent impaired, limited  or restricted   Mobility: Walking and Moving Around Goal Status [phone]) At least 40 percent but less than 60 percent impaired, limited or restricted       Problem List Patient Active Problem List   Diagnosis Date Noted  . Abnormal PET scan of colon 01/04/2015  . Solitary pulmonary nodule on lung CT   . Carpal tunnel syndrome, left 05/03/2014  . Acute renal failure 09/27/2013  . HTN (hypertension) 09/26/2013  . CHF (congestive heart failure) 09/26/2013  . CVA (cerebral infarction) 09/26/2013  . CAP (community acquired pneumonia) 09/26/2013  . CAD (coronary atherosclerotic disease) 09/26/2013  . S/P CABG x 4 09/26/2013    SALISBURY,DONNA 02/02/2015, 3:28 PM  [place], [place], [address] Phone: [phone]   Fax:  [place], PT 02/02/2015 3:28 PM

## 2015-02-02 NOTE — [facility] Oncology Navigator Note (Unsigned)
   Thoracic Treatment Summary Name:Caide Bellucci Date:02/02/2015 DOB:[birth year] Your Medical Team Radiation Oncologist:Dr. Kinard   Type and Stage of Lung Cancer Non-Small Cell Carcinoma: Adenocarcinoma   Clinical Stage: Stage I No matching staging information was found for the patient.   Clinical stage is based on radiology exams.  Pathological stage will be determined after surgery.  Staging is based on the size of the tumor, involvement of lymph nodes or not, and whether or not the cancer center has spread. Recommendations Recommendations: Treatment plan radiation therapy (SBRT)  These recommendations are based on information available as of today's consult.  This is subject to change depending further testing or exams. Next Steps Next Step: Radiation Oncology will set up follow up appointments  3/22 Radiation therapy Barriers to Care What do you perceive as a potential barrier that may prevent you from receiving your treatment plan? Education-materials given and explained  Resources Given: NCI Booklet on [employer] at [employer]org [phone] What to expect at [facility] information Patient safety information   Questions Norton Blizzard, RN BSN Thoracic Oncology Nurse Navigator at [place] is a nurse navigator that is available to assist you through your cancer journey.  She can answer your questions and/or provide resources regarding your treatment plan, emotional support, or financial concerns.

## 2015-02-02 NOTE — Progress Notes (Signed)
Radiation Oncology         [phone] ________________________________  Initial outpatient Consultation  Name: Gary Odonnell MRN: [id number]  Date: 02/02/2015  DOB: 08-Jan-1932  KN:[id number],ALPFXTK Herbie Baltimore, MD  Prescott Gum, Collier [place]ina, MD   REFERRING PHYSICIAN: Prescott Gum, Collier [place], MD  DIAGNOSIS: Clinical stage I adenocarcinoma of the lung presenting in the superior segment of the left lower lobe    HISTORY OF PRESENT ILLNESS::Gary Odonnell is a 81 y.o. male who is seen out of the courtesy of Dr. Prescott Gum for an opinion concerning radiation therapy as part of the management of patient's recently diagnosed adenocarcinoma of the left lung. The patient is a ex-smoker with history of hypertension, peripheral vascular disease and claudication. He is status post CABG 2. Patient presented with pneumonia and chest x-ray showed a pulmonary nodule in the left lower lung measuring approximately 4-5 mm. A subsequent chest CT scan approximately a year later showed this lesion to be enlarged at 7-8 mm with spiculated margins. Patient proceeded to undergo a PET scan which showed uptake in this lesion area to a biopsy was performed which revealed adenocarcinoma. Patient was seen by cardiothoracic surgery and given his health issues was felt to be a poor candidate for surgical resection. He is now referred to radiation oncology for definitive treatment.  PREVIOUS RADIATION THERAPY: No  PAST MEDICAL HISTORY:  has a past medical history of Coronary artery disease; Hypertension; PAD (peripheral artery disease); Hypothyroidism; Stroke (11/2008); Pneumonia (09/2013); Hypercholesteremia; and Solitary pulmonary nodule on lung CT (12/13/13   11/15/14).    PAST SURGICAL HISTORY: Past Surgical History  Procedure Laterality Date  . Knee arthroscopy Right   . Trigger finger release Bilateral   . Stent to right femoral artery    . Angioplasty to left femoral artery    . Coronary artery bypass graft      x4  . Carpal tunnel  release Left 05/11/2014    Procedure: LEFT CARPAL TUNNEL RELEASE;  Surgeon: Carole Civil, MD;  Location: [facility] ORS;  Service: Orthopedics;  Laterality: Left;  . Left and right heart catheterization with coronary angiogram N/A 09/29/2013    Procedure: LEFT AND RIGHT HEART CATHETERIZATION WITH CORONARY ANGIOGRAM;  Surgeon: Burnell Blanks, MD;  Location: [facility] CATH LAB;  Service: Cardiovascular;  Laterality: N/A;  . Colonoscopy N/A 01/24/2015    Procedure: COLONOSCOPY;  Surgeon: Danie Binder, MD;  Location: [facility] ENDO SUITE;  Service: Endoscopy;  Laterality: N/A;  130    FAMILY HISTORY: family history includes Diabetes in his mother; Heart attack in his father. There is no history of Colon cancer.  SOCIAL HISTORY:  reports that he has been smoking Cigars and Cigarettes.  He started smoking about 65 years ago. He has a 60 pack-year smoking history. He has never used smokeless tobacco. He reports that he does not drink alcohol or use illicit drugs.  ALLERGIES: Review of patient's allergies indicates no known allergies.  MEDICATIONS:  Current Outpatient Prescriptions  Medication Sig Dispense Refill  . carvedilol (COREG) 12.5 MG tablet Take 12.5 mg by mouth 2 (two) times daily with a meal.    . ezetimibe (ZETIA) 10 MG tablet Take 1 tablet (10 mg total) by mouth daily. 30 tablet 11  . furosemide (LASIX) 40 MG tablet Take 40 mg by mouth 2 (two) times daily.    . isosorbide mononitrate (IMDUR) 30 MG 24 hr tablet Take 30 mg by mouth daily.    Marland Kitchen levothyroxine (SYNTHROID, LEVOTHROID) 100 MCG tablet Take 100 mcg  by mouth daily.    . potassium chloride (K-DUR,KLOR-CON) 10 MEQ tablet Take 1 tablet (10 mEq total) by mouth daily. 30 tablet 11   No current facility-administered medications for this encounter.    REVIEW OF SYSTEMS:  A 15 point review of systems is documented in the electronic medical record. This was obtained by the nursing staff. However, I reviewed this with the patient to discuss  relevant findings and make appropriate changes. He denies any significant cough or hemoptysis or pain within the chest. He denies any new bony pain headaches dizziness or blurred vision.    PHYSICAL EXAM:  weight is 187 lb 3.2 oz (84.913 kg). His temperature is 98.2 F (36.8 C). His blood pressure is 124/59 and his pulse is 61. His respiration is 19 and oxygen saturation is 99%.   BP 124/59 mmHg  Pulse 61  Temp(Src) 98.2 F (36.8 C)  Resp 19  Wt 187 lb 3.2 oz (84.913 kg)  SpO2 99%  General Appearance:    Alert, cooperative, no distress, appears stated age, accompanied by daughter on evaluation today   Head:    Normocephalic, without obvious abnormality, atraumatic  Eyes:    PERRL, conjunctiva/corneas clear, EOM's intact,           Nose:   Nares normal, septum midline, mucosa normal, no drainage    or sinus tenderness  Throat:   Lips, mucosa, and tongue normal; dentures,gums normal  Neck:   Supple, symmetrical, trachea midline, no adenopathy;       thyroid:  No enlargement/tenderness/nodules; no carotid   bruit or JVD  Back:     Symmetric, no curvature, ROM normal, no CVA tenderness  Lungs:     Clear to auscultation bilaterally, respirations unlabored  Chest wall:    No tenderness or deformity, vertical scar from prior CABG   Heart:    Regular rate and rhythm, S1 and S2 normal, no murmur, rub   or gallop  Abdomen:     Soft, non-tender, bowel sounds active all four quadrants,    no masses, no organomegaly        Extremities:   Extremities normal, atraumatic, no cyanosis or edema  Pulses:   2+ and symmetric all extremities  Skin:   Skin color, texture, turgor normal, no rashes or lesions  Lymph nodes:   Cervical, supraclavicular, and axillary nodes normal  Neurologic:    Normal strength, sensation and reflexes      throughout     ECOG = 1    1 - Symptomatic but completely ambulatory (Restricted in physically strenuous activity but ambulatory and able to carry out work of a  light or sedentary nature. For example, light housework, office work)  LABORATORY DATA:  Lab Results  Component Value Date   WBC 10.5 12/27/2014   HGB 14.1 12/27/2014   HCT 41.2 12/27/2014   MCV 94.5 12/27/2014   PLT 116* 12/27/2014   NEUTROABS 5.2 09/26/2013   Lab Results  Component Value Date   NA 142 05/06/2014   K 5.0 05/06/2014   CL 102 05/06/2014   CO2 29 05/06/2014   GLUCOSE 98 05/06/2014   CREATININE 1.10 01/10/2015   CALCIUM 10.1 05/06/2014      RADIOGRAPHY: Ct Head W Wo Contrast  01/10/2015   CLINICAL DATA:  Lung cancer.  Evaluate for metastases.  EXAM: CT HEAD WITHOUT AND WITH CONTRAST  TECHNIQUE: Contiguous axial images were obtained from the base of the skull through the vertex without and with intravenous contrast  CONTRAST:  18mL OMNIPAQUE IOHEXOL 300 MG/ML  SOLN  COMPARISON:  08/06/2013 head CT  FINDINGS: Skull and Sinuses:No evidence of bone metastasis.  Chronic left sphenoid sinusitis with wall thickening and mild retention of secretions.  Orbits: Negative.  Brain: No abnormal intracranial enhancement to suggest metastasis. No evidence of acute infarction, hemorrhage, hydrocephalus, or shift. Stable appearance of a small, remote high left frontal cortical infarct.  IMPRESSION: 1. No evidence of brain metastasis. 2. Small, remote left frontal infarct.   Electronically Signed   By: Monte Fantasia M.D.   On: 01/10/2015 16:46      IMPRESSION: Cinical stage I adenocarcinoma of the lung presenting in the superior segment of the left lower lobe. The patient would be a good candidate for a definitive course of radiation therapy. Given the size of his lesion and location he would be a candidate for stereotactic body radiation therapy. I discussed this treatment course side effects and potential expected outcomes as well as toxicities. The patient appears to understand and wishes to proceed with planned course of treatment.   Plan:Simulation and planning in approximately 2  weeks I anticipate approximately 3-5 radiation treatments  to complete his stereotactic body radiation therapy.  ------------------------------------------------  Blair Promise, PhD, MD

## 2015-02-05 ENCOUNTER — Other Ambulatory Visit: Payer: Self-pay | Admitting: Cardiology

## 2015-02-06 ENCOUNTER — Other Ambulatory Visit: Payer: Self-pay | Admitting: Radiology

## 2015-02-06 DIAGNOSIS — I6523 Occlusion and stenosis of bilateral carotid arteries: Secondary | ICD-10-CM

## 2015-02-08 ENCOUNTER — Encounter (INDEPENDENT_AMBULATORY_CARE_PROVIDER_SITE_OTHER): Payer: Medicare Other

## 2015-02-08 DIAGNOSIS — I6523 Occlusion and stenosis of bilateral carotid arteries: Secondary | ICD-10-CM | POA: Diagnosis not present

## 2015-02-14 ENCOUNTER — Ambulatory Visit
Admission: RE | Admit: 2015-02-14 | Discharge: 2015-02-14 | Disposition: A | Discharge status: Home / Self Care | Payer: Medicare Other | Source: Ambulatory Visit | Attending: Radiation Oncology | Admitting: Radiation Oncology

## 2015-02-14 VITALS — BP 146/60 | HR 42 | Temp 98.1°F | Resp 20 | Ht 71.5 in | Wt 185.6 lb

## 2015-02-14 DIAGNOSIS — C3432 Malignant neoplasm of lower lobe, left bronchus or lung: Secondary | ICD-10-CM | POA: Diagnosis not present

## 2015-02-14 DIAGNOSIS — Z720 Tobacco use: Secondary | ICD-10-CM | POA: Insufficient documentation

## 2015-02-14 NOTE — Progress Notes (Signed)
  Radiation Oncology         [phone] ________________________________  Name: Gary Odonnell MRN: [id number]  Date: 02/14/2015  DOB: [birth year]  RESPIRATORY MOTION MANAGEMENT SIMULATION  NARRATIVE:  In order to account for effect of respiratory motion on target structures and other organs in the planning and delivery of radiotherapy, this patient underwent respiratory motion management simulation.  To accomplish this, when the patient was brought to the CT simulation planning suite, 4D respiratoy motion management CT images were obtained.  The CT images were loaded into the planning software.  Then, using a variety of tools including Cine, MIP, and standard views, the target volume and planning target volumes (PTV) were delineated.  Avoidance structures were contoured.  Treatment planning then occurred.  Dose volume histograms were generated and reviewed for each of the requested structure.  The resulting plan was carefully reviewed and approved today. -----------------------------------  Blair Promise, PhD, MD

## 2015-02-14 NOTE — Progress Notes (Signed)
  Radiation Oncology         [phone] ________________________________  Name: Gary Odonnell MRN: [id number]  Date: 02/14/2015  DOB: 01/11/1932  SIMULATION AND TREATMENT PLANNING NOTE    ICD-9-CM ICD-10-CM   1. Primary cancer of left lower lobe of lung 162.5 C34.32     DIAGNOSIS:  Clinical stage I adenocarcinoma of the lung presenting in the superior segment of the left lower lobe   NARRATIVE:  The patient was brought to the [facility].  Identity was confirmed.  All relevant records and images related to the planned course of therapy were reviewed.  The patient freely provided informed written consent to proceed with treatment after reviewing the details related to the planned course of therapy. The consent form was witnessed and verified by the simulation staff.  Then, the patient was set-up in a stable reproducible  supine position for radiation therapy.  CT images were obtained.  Surface markings were placed.  The CT images were loaded into the planning software.  Then the target and avoidance structures were contoured.  Treatment planning then occurred.  The radiation prescription was entered and confirmed.  Then, I designed and supervised the construction of a total of 2 medically necessary complex treatment devices.  I have requested : This treatment constitutes a Special Treatment Procedure for the following reason: [ High dose per fraction requiring special monitoring for increased toxicities of treatment including daily imaging..  I have ordered:dose calc.  PLAN:  The patient will receive 54 Gy in 3 fractions using SBRT treatment techniques.  ________________________________ -----------------------------------  Blair Promise, PhD, MD

## 2015-02-14 NOTE — Progress Notes (Signed)
Gary Odonnell and his wife are here for CT SIM.  Patient denies pain.  His heart rate is low at 42 and 51 when retaken.  He denies feeling lightheaded and dizzy.  Dr. Sondra Come notified.

## 2015-02-16 ENCOUNTER — Telehealth: Payer: Self-pay | Admitting: *Deleted

## 2015-02-16 ENCOUNTER — Encounter: Payer: Self-pay | Admitting: *Deleted

## 2015-02-16 NOTE — Telephone Encounter (Signed)
Called to follow up with patient from thoracic clinic.  I spoke to his family and they stated he was doing well.  I stated they can call the cancer center if needed.

## 2015-02-17 ENCOUNTER — Telehealth: Payer: Self-pay | Admitting: *Deleted

## 2015-02-17 NOTE — Telephone Encounter (Signed)
Pt made aware, forwarded to Dr. Edrick Oh

## 2015-02-17 NOTE — Telephone Encounter (Signed)
-----   Message from Arnoldo Lenis, MD sent at 02/17/2015 11:23 AM EDT ----- Carotid US shows only mild blockages in the arteries of the neck, we will continue to monitor  Zandra Abts MD

## 2015-02-20 DIAGNOSIS — C3432 Malignant neoplasm of lower lobe, left bronchus or lung: Secondary | ICD-10-CM | POA: Diagnosis not present

## 2015-02-23 ENCOUNTER — Ambulatory Visit
Admission: RE | Admit: 2015-02-23 | Discharge: 2015-02-23 | Disposition: A | Discharge status: Home / Self Care | Payer: Medicare Other | Source: Ambulatory Visit | Attending: Radiation Oncology | Admitting: Radiation Oncology

## 2015-02-23 DIAGNOSIS — C3432 Malignant neoplasm of lower lobe, left bronchus or lung: Secondary | ICD-10-CM | POA: Diagnosis not present

## 2015-02-23 NOTE — Progress Notes (Signed)
  Radiation Oncology         [phone] ________________________________  Name: Gary Odonnell MRN: [id number]  Date: 02/23/2015  DOB: 27-Nov-1931  Stereotactic Body Radiotherapy Treatment Procedure Note  DIAGNOSIS: Clinical stage I adenocarcinoma of the lung presenting in the superior segment of the left lower lobe   Dose: 18 Gy of planned 54 Gy  NARRATIVE:  Gary Odonnell was brought to the stereotactic radiation treatment machine and placed supine on the CT couch. The patient was set up for stereotactic body radiotherapy on the body fix pillow.  3D TREATMENT PLANNING AND DOSIMETRY:  The patient's radiation plan was reviewed and approved prior to starting treatment.  It showed 3-dimensional radiation distributions overlaid onto the planning CT.  The [facility] for the target structures as well as the organs at risk were reviewed. The documentation of this is filed in the radiation oncology EMR.  SIMULATION VERIFICATION:  The patient underwent CT imaging on the treatment unit.  These were carefully aligned to document that the ablative radiation dose would cover the target volume and maximally spare the nearby organs at risk according to the planned distribution.  SPECIAL TREATMENT PROCEDURE: Gary Odonnell received high dose ablative stereotactic body radiotherapy to the planned target volume without unforeseen complications. Treatment was delivered uneventfully. The high doses associated with stereotactic body radiotherapy and the significant potential risks require careful treatment set up and patient monitoring constituting a special treatment procedure   STEREOTACTIC TREATMENT MANAGEMENT:  Following delivery, the patient was evaluated clinically. The patient tolerated treatment without significant acute effects, and was discharged to home in stable condition.    PLAN: Continue treatment as planned.  ________________________________  Blair Promise, PhD, MD

## 2015-02-24 DIAGNOSIS — IMO0001 Reserved for inherently not codable concepts without codable children: Secondary | ICD-10-CM

## 2015-02-24 HISTORY — DX: Reserved for inherently not codable concepts without codable children: IMO0001

## 2015-02-27 ENCOUNTER — Ambulatory Visit
Admission: RE | Admit: 2015-02-27 | Discharge: 2015-02-27 | Disposition: A | Discharge status: Home / Self Care | Payer: Medicare Other | Source: Ambulatory Visit | Attending: Radiation Oncology | Admitting: Radiation Oncology

## 2015-02-27 ENCOUNTER — Encounter: Payer: Self-pay | Admitting: Radiation Oncology

## 2015-02-27 DIAGNOSIS — C3432 Malignant neoplasm of lower lobe, left bronchus or lung: Secondary | ICD-10-CM | POA: Diagnosis not present

## 2015-02-27 NOTE — Progress Notes (Signed)
  Radiation Oncology         [phone] ________________________________  Name: Gary Odonnell MRN: [id number]  Date: 02/27/2015  DOB: 02/05/1932  Stereotactic Body Radiotherapy Treatment Procedure Note  NARRATIVE:  Gary Odonnell was brought to the stereotactic radiation treatment machine and placed supine on the CT couch. The patient was set up for stereotactic body radiotherapy on the body fix pillow.  3D TREATMENT PLANNING AND DOSIMETRY:  The patient's radiation plan was reviewed and approved prior to starting treatment.  It showed 3-dimensional radiation distributions overlaid onto the planning CT.  The [facility] for the target structures as well as the organs at risk were reviewed. The documentation of this is filed in the radiation oncology EMR.  SIMULATION VERIFICATION:  The patient underwent CT imaging on the treatment unit.  These were carefully aligned to document that the ablative radiation dose would cover the left lung target volume and maximally spare the nearby organs at risk according to the planned distribution.  SPECIAL TREATMENT PROCEDURE: Gary Odonnell received high dose ablative stereotactic body radiotherapy to the planned target volume without unforeseen complications. Treatment was delivered uneventfully.  Gary Odonnell received an additional 1800 cGy for a cumulative dose of 5400 cGy in 3 sessions of a prescribed 5400 cGy The high doses associated with stereotactic body radiotherapy and the significant potential risks require careful treatment set up and patient monitoring constituting a special treatment procedure   STEREOTACTIC TREATMENT MANAGEMENT:  Following delivery, the patient was evaluated clinically. The patient tolerated treatment without significant acute effects, and was discharged to home in stable condition.    PLAN: Continue treatment as planned.  ------------------------------------------------        Rexene Edison, MD

## 2015-03-02 ENCOUNTER — Encounter: Payer: Self-pay | Admitting: Radiation Oncology

## 2015-03-02 ENCOUNTER — Encounter: Payer: Self-pay | Admitting: *Deleted

## 2015-03-02 ENCOUNTER — Ambulatory Visit
Admission: RE | Admit: 2015-03-02 | Discharge: 2015-03-02 | Disposition: A | Discharge status: Home / Self Care | Payer: Medicare Other | Source: Ambulatory Visit | Attending: Radiation Oncology | Admitting: Radiation Oncology

## 2015-03-02 ENCOUNTER — Ambulatory Visit: Payer: Medicare Other | Admitting: Cardiology

## 2015-03-02 VITALS — BP 121/41 | HR 59 | Temp 98.4°F | Resp 20 | Ht 71.5 in | Wt 186.0 lb

## 2015-03-02 DIAGNOSIS — C3432 Malignant neoplasm of lower lobe, left bronchus or lung: Secondary | ICD-10-CM

## 2015-03-02 NOTE — Progress Notes (Signed)
Spoke with patient today before his radiation therapy.  He states he is feeling well.  He has no concerns nor questions at this time.

## 2015-03-02 NOTE — Progress Notes (Signed)
Gary Odonnell has completed SBRT to his left lower lung.  He denies pain.  He denies shortness of breath.  He reports a cough from sinus drainage.  He reports coughing up clear liquid.  He reports numbness on the left side of his face and left hand from nerve damage.  He reports he has trouble swallowing from a past stroke.  He denies fatigue.  He has been given a one month follow up appointment.    BP 121/41 mmHg  Pulse 59  Temp(Src) 98.4 F (36.9 C) (Oral)  Resp 20  Ht 5' 11.5" (1.816 m)  Wt 186 lb (84.369 kg)  BMI 25.58 kg/m2  SpO2 100%

## 2015-03-02 NOTE — Progress Notes (Signed)
  Radiation Oncology         [phone] ________________________________  Name: Gary Odonnell MRN: [id number]  Date: 03/02/2015  DOB: January 05, 1932  Weekly Radiation Therapy Management  DIAGNOSIS: Clinical stage I adenocarcinoma of the lung presenting in the superior segment of the left lower lobe   Current Dose: 54 Gy     Planned Dose:  54 Gy  Narrative . . . . . . . . The patient presents for routine under treatment assessment.                                   The patient is without complaint.                                 Set-up films were reviewed.                                 The chart was checked. Physical Findings. . Vital signs stable, the lungs are clear. The heart has regular rhythm and rate.   Weight essentially stable.   Impression . . . . . . . The patient is tolerating radiation. Plan . . . . . . . . . . . .   Routine follow-up in 4-6 weeks   ________________________________   Blair Promise, PhD, MD

## 2015-03-02 NOTE — Progress Notes (Signed)
  Radiation Oncology         [phone] ________________________________  Name: Gary Odonnell MRN: [id number]  Date: 03/02/2015  DOB: 1932-02-18  End of Treatment Note   DIAGNOSIS: Clinical stage I adenocarcinoma of the lung presenting in the superior segment of the left lower lobe   Indication for treatment:  Definitive/curative treatment       Radiation treatment dates:   March 31, April 4, April 7  Site/dose:   Superior segment of left lower lobe pulmonary nodule, 54 Gy in 3 fractions  Beams/energy:   SBRT/SRT- 3D, 6 FFF  Narrative: The patient tolerated radiation treatment relatively well.   No side effects during treatment  Plan: The patient has completed radiation treatment. The patient will return to radiation oncology clinic for routine followup in one month. I advised them to call or return sooner if they have any questions or concerns related to their recovery or treatment.  -----------------------------------  Blair Promise, PhD, MD

## 2015-03-02 NOTE — Progress Notes (Signed)
  Radiation Oncology         [phone] ________________________________  Name: Gary Odonnell MRN: [id number]  Date: 03/02/2015  DOB: 1932/09/29  Stereotactic Body Radiotherapy Treatment Procedure Note (88 GY of planned 110 GY)  NARRATIVE:  Gary Odonnell was brought to the stereotactic radiation treatment machine and placed supine on the CT couch. The patient was set up for stereotactic body radiotherapy on the body fix pillow.  3D TREATMENT PLANNING AND DOSIMETRY:  The patient's radiation plan was reviewed and approved prior to starting treatment.  It showed 3-dimensional radiation distributions overlaid onto the planning CT.  The [facility] for the target structures as well as the organs at risk were reviewed. The documentation of this is filed in the radiation oncology EMR.  SIMULATION VERIFICATION:  The patient underwent CT imaging on the treatment unit.  These were carefully aligned to document that the ablative radiation dose would cover the target volume and maximally spare the nearby organs at risk according to the planned distribution.  SPECIAL TREATMENT PROCEDURE: Gary Odonnell received high dose ablative stereotactic body radiotherapy to the planned target volume without unforeseen complications. Treatment was delivered uneventfully. The high doses associated with stereotactic body radiotherapy and the significant potential risks require careful treatment set up and patient monitoring constituting a special treatment procedure   STEREOTACTIC TREATMENT MANAGEMENT:  Following delivery, the patient was evaluated clinically. The patient tolerated treatment without significant acute effects, and was discharged to home in stable condition.    PLAN: Continue treatment as planned.  ________________________________  Blair Promise, PhD, MD

## 2015-03-06 ENCOUNTER — Ambulatory Visit (INDEPENDENT_AMBULATORY_CARE_PROVIDER_SITE_OTHER): Payer: Medicare Other | Admitting: Cardiology

## 2015-03-06 ENCOUNTER — Encounter: Payer: Self-pay | Admitting: Cardiology

## 2015-03-06 VITALS — BP 152/70 | HR 60 | Ht 71.0 in | Wt 187.0 lb

## 2015-03-06 DIAGNOSIS — E785 Hyperlipidemia, unspecified: Secondary | ICD-10-CM | POA: Diagnosis not present

## 2015-03-06 DIAGNOSIS — I255 Ischemic cardiomyopathy: Secondary | ICD-10-CM | POA: Diagnosis not present

## 2015-03-06 DIAGNOSIS — I6523 Occlusion and stenosis of bilateral carotid arteries: Secondary | ICD-10-CM

## 2015-03-06 DIAGNOSIS — I2510 Atherosclerotic heart disease of native coronary artery without angina pectoris: Secondary | ICD-10-CM | POA: Diagnosis not present

## 2015-03-06 DIAGNOSIS — I10 Essential (primary) hypertension: Secondary | ICD-10-CM

## 2015-03-06 DIAGNOSIS — M79606 Pain in leg, unspecified: Secondary | ICD-10-CM

## 2015-03-06 NOTE — Progress Notes (Signed)
Clinical Summary [name] is a 81 y.o.male seen today for follow up of the following medical problems.   1. CAD/ICM  - history of prior CABG 2003 in [place]  - 09/2013 admission to [facility] with decompensated heart failure. Workup included an echo which showed an LVEF of 15%. He was transferred to [facility] for further management.  - cath 09/2013 showed LM 30%, LAD 100% proximal, LCX 50% ostial, RCA 100% mid with distal vessel filling with right to right and left to right collaterals. LIMA-LAD patent, graft from [place] to OM1, OM2, PDA occluded. No PCI targets, recommendations for medical management. RHC mean PA 43, wedge 22, CI 1.74.  - repeat echo 04/2014 shows LVEF improved to 40-45%.  - no significant symptoms since last visit. No chest pain, no SOB, no orthopnea or PND.    2. Hyperlipidemia  - compliant with zetia  - lipitor and crestor have caused prior muscle aches, not on statin   3. Lung CA - has just completed radiation treatment.   4. Leg pain - bilateral leg pain with exertion. Tends to feel it most in bilateral shins, better with rest.  - hx of prior interventions - no sores on feet  5. Carotid stenosis - repeat US 01/2015 with only mild bilateral disease.  - denies any neuro symptoms Past Medical History  Diagnosis Date  . Coronary artery disease   . Hypertension   . PAD (peripheral artery disease)   . Hypothyroidism   . Stroke 11/2008  . Pneumonia 09/2013  . Hypercholesteremia   . Solitary pulmonary nodule on lung CT 12/13/13   11/15/14     No Known Allergies   Current Outpatient Prescriptions  Medication Sig Dispense Refill  . aspirin 81 MG tablet Take 81 mg by mouth daily.    . carvedilol (COREG) 12.5 MG tablet Take 12.5 mg by mouth 2 (two) times daily with a meal.    . ezetimibe (ZETIA) 10 MG tablet Take 1 tablet (10 mg total) by mouth daily. 30 tablet 11  . furosemide (LASIX) 40 MG tablet Take 40 mg by mouth 2 (two) times daily.      . isosorbide mononitrate (IMDUR) 30 MG 24 hr tablet Take 30 mg by mouth daily.    Marland Kitchen levothyroxine (SYNTHROID, LEVOTHROID) 100 MCG tablet Take 100 mcg by mouth daily.    . potassium chloride (K-DUR,KLOR-CON) 10 MEQ tablet Take 1 tablet (10 mEq total) by mouth daily. 30 tablet 11   No current facility-administered medications for this visit.     Past Surgical History  Procedure Laterality Date  . Knee arthroscopy Right   . Trigger finger release Bilateral   . Stent to right femoral artery    . Angioplasty to left femoral artery    . Coronary artery bypass graft      x4  . Carpal tunnel release Left 05/11/2014    Procedure: LEFT CARPAL TUNNEL RELEASE;  Surgeon: Carole Civil, MD;  Location: [facility] ORS;  Service: Orthopedics;  Laterality: Left;  . Left and right heart catheterization with coronary angiogram N/A 09/29/2013    Procedure: LEFT AND RIGHT HEART CATHETERIZATION WITH CORONARY ANGIOGRAM;  Surgeon: Burnell Blanks, MD;  Location: [facility] CATH LAB;  Service: Cardiovascular;  Laterality: N/A;  . Colonoscopy N/A 01/24/2015    Procedure: COLONOSCOPY;  Surgeon: Danie Binder, MD;  Location: [facility] ENDO SUITE;  Service: Endoscopy;  Laterality: N/A;  130     No Known Allergies  Family History  Problem Relation Age of Onset  . Heart attack Father   . Diabetes Mother   . Colon cancer Neg Hx      Social History Gary Odonnell reports that he has been smoking Cigars and Cigarettes.  He started smoking about 65 years ago. He has a 60 pack-year smoking history. He has never used smokeless tobacco. Gary Odonnell reports that he does not drink alcohol.   Review of Systems CONSTITUTIONAL: No weight loss, fever, chills, weakness or fatigue.  HEENT: Eyes: No visual loss, blurred vision, double vision or yellow sclerae.No hearing loss, sneezing, congestion, runny nose or sore throat.  SKIN: No rash or itching.  CARDIOVASCULAR: per HPI RESPIRATORY: No shortness of breath, cough or sputum.   GASTROINTESTINAL: No anorexia, nausea, vomiting or diarrhea. No abdominal pain or blood.  GENITOURINARY: No burning on urination, no polyuria NEUROLOGICAL: No headache, dizziness, syncope, paralysis, ataxia, numbness or tingling in the extremities. No change in bowel or bladder control.  MUSCULOSKELETAL: No muscle, back pain, joint pain or stiffness.  LYMPHATICS: No enlarged nodes. No history of splenectomy.  PSYCHIATRIC: No history of depression or anxiety.  ENDOCRINOLOGIC: No reports of sweating, cold or heat intolerance. No polyuria or polydipsia.  Marland Kitchen   Physical Examination p 60 bp 140/70 Wt 187 lbs BMI 26 Gen: resting comfortably, no acute distress HEENT: no scleral icterus, pupils equal round and reactive, no palptable cervical adenopathy,  CV: RRR, no m/r/g, no JVD. Bilateral carotid bruits. Decreased bilateral DP/PT pulses Resp: Clear to auscultation bilaterally GI: abdomen is soft, non-tender, non-distended, normal bowel sounds, no hepatosplenomegaly MSK: extremities are warm, no edema.  Skin: warm, no rash Neuro:  no focal deficits Psych: appropriate affect   Diagnostic Studies 09/29/13 Cath  Hemodynamic Findings:  Ao: 162/70  LV: 163/19/29  RA: 12  RV: 66/7/16  PA: 67/26 (mean 43)  PCWP: 22  Fick Cardiac Output: 3.5 L/min  Fick Cardiac Index: 1.74 L/min/m2  Central Aortic Saturation: 91%  Pulmonary Artery Saturation: 50%  Angiographic Findings:  Left main: Distal 30% stenosis.  Left Anterior Descending Artery: 100% proximal occlusion. Proximal, mid and distal vessel fills from the patent IMA graft.  Circumflex Artery: Large caliber vessel with large intermediate Gary Odonnell. Diffuse non-obstructive plaque in the large bifurcating intermediate Gary Odonnell. The ostium of the Circumflex has 50% stenosis.  Right Coronary Artery: Large dominant vessel with 100% mid occlusion. The distal vessel fills right to right bridging collaterals and left to right collaterals.   Graft Anatomy:  LIMA to LAD is patent  Free Radial (? Y graft from LIMA) to OM1, OM2, PDA is occluded.  Left Ventricular Angiogram: Deferred.  Impression:  1. Triple vessel CAD with patent LIMA to LAD, occluded RCA with good collateral filling, moderate disease in Circumflex  2. No focal targets for PCI  3. Ischemic cardiomyopathy with acute systolic CHF   59/5/63 Echo  LVEF 15-20%, moderate LVH, severe diffuse hypokinesis, restrictive diastolic dysfunction, multiple WMAs, moderate MR, severe RV dysfunction RV TAPSE 0.7 cm, PASP 61 mmHg.   12/2013 Lab  TC 175 TG 111 LDL 116 HDL 37  TSH 7.7 HgbA1c 6.6 Na 143 K 4.5 Cr 1.22 GFR56   04/2014 Echo Study Conclusions  - Left ventricle: The cavity size was normal. Wall thickness was increased in a pattern of mild LVH. Systolic function was mildly to moderately reduced. The estimated ejection fraction was in the range of 40% to 45%. There is diastolic dysfunction, indeterminant grade. - Regional wall motion abnormality: Hypokinesis  of the mid anterior and basal-mid anteroseptal myocardium. - Aortic valve: Moderately calcified annulus. Trileaflet; moderately thickened leaflets. Valve area (VTI): 2.48 cm^2. Valve area (Vmax): 2.23 cm^2. - Mitral valve: Moderately calcified annulus. Mildly thickened leaflets . - Left atrium: The atrium was moderately dilated. - Right atrium: The atrium was mildly dilated. - Technically adequate study.   01/2015 Carotid US bilaterall 1-39%  Assessment and Plan  1. CAD/ICM  - previously severe LV systolic dysfunction, LVEF 15-20%. Repeat echo shows improved function at 40-45%. He is NYHA II. Last cath described above.  - continue current meds. At some point his ACE-I was stopped by another provider, it is not clear why. Will request most recent clinic notes from his pcp Dr Edrick Oh as well as labs.    2. Hyperlipidemia  - intolerant to statins, continue zetia.   3. Leg pain - hx of PAD,  currently having exertional symptoms. Has diminished pulses on exam. Will obtain ABI and LE arterial US.   4. Carotid stenosis - mild bilateral disease by [place] 01/2015, continue to follow  4. HTN - at goal, continue current meds   F/u 6 months       Arnoldo Lenis, M.D.

## 2015-03-06 NOTE — Patient Instructions (Signed)
Your physician has requested that you have an ankle brachial index (ABI). During this test an ultrasound and blood pressure cuff are used to evaluate the arteries that supply the arms and legs with blood. Allow thirty minutes for this exam. There are no restrictions or special instructions. Office will contact with results via phone or letter.   Continue all current medications. Your physician wants you to follow up in: 6 months.  You will receive a reminder letter in the mail one-two months in advance.  If you don't receive a letter, please call our office to schedule the follow up appointment

## 2015-03-07 ENCOUNTER — Encounter: Payer: Self-pay | Admitting: Radiation Oncology

## 2015-03-07 NOTE — Progress Notes (Signed)
  Radiation Oncology         [phone] ________________________________  Name: Gary Odonnell MRN: [id number]  Date: 03/07/2015  DOB: June 29, 1932  End of Treatment Note  Diagnosis:  Clinical stage I adenocarcinoma of the lung presenting in the superior segment of the left lower lobe      Indication for treatment:  Definitive/curative treatment       Radiation treatment dates:   March 31, April 4, April 7  Site/dose:  Superior Segment left lower lobe nodule, 54 gray in 3 fractions  Beams/energy:   Stereotactic body radiation therapy (SBRT)  Narrative: The patient tolerated radiation treatment relatively well.   No complaints of cough shortness of breath or chest pain during the course of treatment  Plan: The patient has completed radiation treatment. The patient will return to radiation oncology clinic for routine followup in one month. I advised them to call or return sooner if they have any questions or concerns related to their recovery or treatment.  -----------------------------------  Blair Promise, PhD, MD

## 2015-03-15 ENCOUNTER — Encounter (INDEPENDENT_AMBULATORY_CARE_PROVIDER_SITE_OTHER): Payer: Medicare Other

## 2015-03-15 ENCOUNTER — Other Ambulatory Visit: Payer: Self-pay | Admitting: Radiology

## 2015-03-15 DIAGNOSIS — M79606 Pain in leg, unspecified: Secondary | ICD-10-CM | POA: Diagnosis not present

## 2015-03-15 DIAGNOSIS — I739 Peripheral vascular disease, unspecified: Secondary | ICD-10-CM

## 2015-03-23 ENCOUNTER — Telehealth: Payer: Self-pay | Admitting: *Deleted

## 2015-03-23 DIAGNOSIS — R0989 Other specified symptoms and signs involving the circulatory and respiratory systems: Secondary | ICD-10-CM

## 2015-03-23 NOTE — Telephone Encounter (Signed)
Pt made aware, would like vascular Dr closer to [facility] area, told pt that he would most likely have to see Dr. [place] but would add in note. Orders in for referral, forwarded to Dr. Edrick Oh and schedulers.

## 2015-03-23 NOTE — Telephone Encounter (Signed)
-----   Message from Laurine Blazer, LPN sent at 6/60/6004 10:04 AM EDT -----   ----- Message -----    From: Arnoldo Lenis, MD    Sent: 03/17/2015  12:42 PM      To: Laurine Blazer, LPN  Please let patient know that the US of the legs shows very poor circulation, and this is likely the cause of his leg pains. Please refer him to vacular for leg pain.  Zandra Abts MD

## 2015-04-11 ENCOUNTER — Encounter: Payer: Self-pay | Admitting: Vascular Surgery

## 2015-04-12 ENCOUNTER — Inpatient Hospital Stay: Admission: RE | Admit: 2015-04-12 | Payer: Medicare Other | Source: Ambulatory Visit | Admitting: Radiation Oncology

## 2015-04-12 ENCOUNTER — Ambulatory Visit: Payer: Medicare Other | Admitting: Radiation Oncology

## 2015-04-13 ENCOUNTER — Other Ambulatory Visit: Payer: Self-pay

## 2015-04-13 ENCOUNTER — Encounter: Payer: Self-pay | Admitting: Vascular Surgery

## 2015-04-13 ENCOUNTER — Ambulatory Visit (INDEPENDENT_AMBULATORY_CARE_PROVIDER_SITE_OTHER): Payer: Medicare Other | Admitting: Vascular Surgery

## 2015-04-13 VITALS — BP 125/41 | HR 55 | Ht 71.0 in | Wt 186.0 lb

## 2015-04-13 DIAGNOSIS — R1011 Right upper quadrant pain: Secondary | ICD-10-CM | POA: Diagnosis not present

## 2015-04-13 DIAGNOSIS — I255 Ischemic cardiomyopathy: Secondary | ICD-10-CM | POA: Diagnosis not present

## 2015-04-13 NOTE — Progress Notes (Signed)
[facility] [place] HISTORY AND PHYSICAL   History of Present Illness:  Patient is a 81 y.o. year old male who presents for evaluation of bilateral lower extremity peripheral arterial disease. The patient has claudication symptoms in both legs that began after walking one to 200 feet. He describes pain in the calf in both legs. However he also states that he does have a history of chronic back pain and a T12 compression fracture that may be contributing to some of his pain. He previously had a stent placed in his right leg and a balloon angioplasty in his left leg. Both procedures were performed in [place] 2003. He denies rest pain. He denies nonhealing wounds. He currently smokes half a pack cigars a day. He is not interested in quitting. He was recently treated for lung cancer with radiation therapy in April of this year. He was turned down for resection of the cancer due to severe COPD with 44% reduced lung function by pulmonary function testing.  He recently has intermittently had some right upper quadrant abdominal pain especially after eating meals such as ice cream. He denies any pain in this area today. Other medical problems include coronary artery disease, hypertension, hypothyroidism, prior stroke, elevated cholesterol all of which are currently stable.  Past Medical History  Diagnosis Date  . Coronary artery disease   . Hypertension   . PAD (peripheral artery disease)   . Hypothyroidism   . Stroke 11/2008  . Pneumonia 09/2013  . Hypercholesteremia   . Solitary pulmonary nodule on lung CT 12/13/13   11/15/14  . Cancer     L lung    Past Surgical History  Procedure Laterality Date  . Knee arthroscopy Right   . Trigger finger release Bilateral   . Stent to right femoral artery    . Angioplasty to left femoral artery    . Coronary artery bypass graft      x4  . Carpal tunnel release Left 05/11/2014    Procedure: LEFT CARPAL TUNNEL RELEASE;  Surgeon: Carole Civil, MD;  Location: [facility] ORS;  Service: Orthopedics;  Laterality: Left;  . Left and right heart catheterization with coronary angiogram N/A 09/29/2013    Procedure: LEFT AND RIGHT HEART CATHETERIZATION WITH CORONARY ANGIOGRAM;  Surgeon: Burnell Blanks, MD;  Location: [facility] CATH LAB;  Service: Cardiovascular;  Laterality: N/A;  . Colonoscopy N/A 01/24/2015    Procedure: COLONOSCOPY;  Surgeon: Danie Binder, MD;  Location: [facility] ENDO SUITE;  Service: Endoscopy;  Laterality: N/A;  130    Social History History  Substance Use Topics  . Smoking status: Current Every Day Smoker -- 1.00 packs/day for 60 years    Types: Cigars, Cigarettes    Start date: 11/21/1949  . Smokeless tobacco: Never Used     Comment: former cigarette smoker/smoked cigarettes for 30 years  . Alcohol Use: No    Family History Family History  Problem Relation Age of Onset  . Heart attack Father   . Hyperlipidemia Father   . Diabetes Mother   . Colon cancer Neg Hx   . Diabetes Brother     Allergies  No Known Allergies   Current Outpatient Prescriptions  Medication Sig Dispense Refill  . aspirin 81 MG tablet Take 81 mg by mouth daily.    . carvedilol (COREG) 12.5 MG tablet Take 12.5 mg by mouth 2 (two) times daily with a meal.    . ezetimibe (ZETIA) 10 MG tablet Take 1 tablet (  10 mg total) by mouth daily. 30 tablet 11  . furosemide (LASIX) 40 MG tablet Take 40 mg by mouth 2 (two) times daily.    . isosorbide mononitrate (IMDUR) 30 MG 24 hr tablet Take 30 mg by mouth daily.    Marland Kitchen levothyroxine (SYNTHROID, LEVOTHROID) 50 MCG tablet Take 50 mcg by mouth daily before breakfast.    . potassium chloride (K-DUR,KLOR-CON) 10 MEQ tablet Take 1 tablet (10 mEq total) by mouth daily. 30 tablet 11   No current facility-administered medications for this visit.    ROS:   General:  No weight loss, Fever, chills  HEENT: No recent headaches, no nasal bleeding, no visual changes, no sore throat  Neurologic: No  dizziness, blackouts, seizures. No recent symptoms of stroke or mini- stroke. No recent episodes of slurred speech, or temporary blindness.  Cardiac: No recent episodes of chest pain/pressure, no shortness of breath at rest.  + shortness of breath with exertion.  Denies history of atrial fibrillation or irregular heartbeat  Vascular: No history of rest pain in feet.  + history of claudication.  No history of non-healing ulcer, No history of DVT   Pulmonary: No home oxygen, no productive cough, no hemoptysis,  No asthma or wheezing  Musculoskeletal:  '[ ]'$  Arthritis, [x ] Low back pain,  '[ ]'$  Joint pain  Hematologic:No history of hypercoagulable state.  No history of easy bleeding.  No history of anemia  Gastrointestinal: No hematochezia or melena,  No gastroesophageal reflux, no trouble swallowing  Urinary: '[ ]'$  chronic Kidney disease, '[ ]'$  on HD - '[ ]'$  MWF or '[ ]'$  TTHS, '[ ]'$  Burning with urination, '[ ]'$  Frequent urination, '[ ]'$  Difficulty urinating;   Skin: No rashes  Psychological: No history of anxiety,  No history of depression   Physical Examination  Filed Vitals:   04/13/15 0908  BP: 125/41  Pulse: 55  Height: '5\' 11"'$  (1.803 m)  Weight: 186 lb (84.369 kg)  SpO2: 99%    Body mass index is 25.95 kg/(m^2).  General:  Alert and oriented, no acute distress HEENT: Normal Neck: No bruit or JVD Pulmonary: Clear to auscultation bilaterally Cardiac: Regular Rate and Rhythm without murmur Abdomen: Soft, non-tender, non-distended, palpable mass right upper quadrant of abdomen consistent with possible calcified gallbladder, no scars Skin: No rash Extremity Pulses:  2+ radial, brachial, 1-2+ femoral bilateral, absent popliteal dorsalis pedis, posterior tibial pulses bilaterally Musculoskeletal: No deformity or edema  Neurologic: Upper and lower extremity motor 5/5 and symmetric  DATA:  She had bilateral ABIs performed at [facility] lab on 03/15/2015. ABI on the right was 0.29 left  was 0.44. He also had bilateral superficial femoral artery occlusions.   ASSESSMENT:  Patient with bilateral lower extremity claudication mild-to-moderate symptoms. He has known bilateral superficial femoral artery occlusions. He also has multiple known medical comorbidities.   PLAN:  #1 ultrasound right upper quadrant to further define right upper quadrant mass #2 aortogram bilateral extremity runoff possible intervention scheduled for May 27. Risks benefits possible complications and procedure details were discussed with the patient today including but not limited to bleeding infection vessel injury contrast reaction he understands and agrees to proceed.  Ruta Hinds, MD [facility] [place] Office: [phone] Pager: [phone]

## 2015-04-17 ENCOUNTER — Telehealth: Payer: Self-pay | Admitting: Vascular Surgery

## 2015-04-17 NOTE — Addendum Note (Signed)
Addended by: Mena Goes on: 04/17/2015 10:43 AM   Modules accepted: Orders

## 2015-04-17 NOTE — Telephone Encounter (Signed)
Spoke with pt's wife - appt with [facility] for RUQ [place] - kf Verbalized understanding

## 2015-04-19 ENCOUNTER — Ambulatory Visit (HOSPITAL_COMMUNITY)
Admission: RE | Admit: 2015-04-19 | Discharge: 2015-04-19 | Disposition: A | Discharge status: Home / Self Care | Payer: Medicare Other | Source: Ambulatory Visit | Attending: Vascular Surgery | Admitting: Vascular Surgery

## 2015-04-19 DIAGNOSIS — R1901 Right upper quadrant abdominal swelling, mass and lump: Secondary | ICD-10-CM | POA: Insufficient documentation

## 2015-04-19 DIAGNOSIS — K8020 Calculus of gallbladder without cholecystitis without obstruction: Secondary | ICD-10-CM | POA: Diagnosis not present

## 2015-04-19 DIAGNOSIS — R1011 Right upper quadrant pain: Secondary | ICD-10-CM | POA: Insufficient documentation

## 2015-04-19 DIAGNOSIS — Z85118 Personal history of other malignant neoplasm of bronchus and lung: Secondary | ICD-10-CM | POA: Diagnosis not present

## 2015-04-19 DIAGNOSIS — I10 Essential (primary) hypertension: Secondary | ICD-10-CM | POA: Insufficient documentation

## 2015-04-21 ENCOUNTER — Encounter (HOSPITAL_COMMUNITY)
Admission: RE | Disposition: A | Discharge status: Home / Self Care | Payer: Medicare Other | Source: Ambulatory Visit | Attending: Vascular Surgery

## 2015-04-21 ENCOUNTER — Encounter (HOSPITAL_COMMUNITY): Payer: Self-pay | Admitting: Vascular Surgery

## 2015-04-21 ENCOUNTER — Ambulatory Visit (HOSPITAL_COMMUNITY)
Admission: RE | Admit: 2015-04-21 | Discharge: 2015-04-22 | Disposition: A | Discharge status: Home / Self Care | Payer: Medicare Other | Source: Ambulatory Visit | Attending: Vascular Surgery | Admitting: Vascular Surgery

## 2015-04-21 ENCOUNTER — Other Ambulatory Visit: Payer: Self-pay | Admitting: *Deleted

## 2015-04-21 ENCOUNTER — Inpatient Hospital Stay (HOSPITAL_COMMUNITY): Payer: Medicare Other

## 2015-04-21 DIAGNOSIS — I739 Peripheral vascular disease, unspecified: Secondary | ICD-10-CM

## 2015-04-21 DIAGNOSIS — R1900 Intra-abdominal and pelvic swelling, mass and lump, unspecified site: Secondary | ICD-10-CM

## 2015-04-21 DIAGNOSIS — F17210 Nicotine dependence, cigarettes, uncomplicated: Secondary | ICD-10-CM | POA: Diagnosis not present

## 2015-04-21 DIAGNOSIS — I2510 Atherosclerotic heart disease of native coronary artery without angina pectoris: Secondary | ICD-10-CM | POA: Diagnosis not present

## 2015-04-21 DIAGNOSIS — Z8673 Personal history of transient ischemic attack (TIA), and cerebral infarction without residual deficits: Secondary | ICD-10-CM | POA: Diagnosis not present

## 2015-04-21 DIAGNOSIS — I70212 Atherosclerosis of native arteries of extremities with intermittent claudication, left leg: Secondary | ICD-10-CM

## 2015-04-21 DIAGNOSIS — I70213 Atherosclerosis of native arteries of extremities with intermittent claudication, bilateral legs: Secondary | ICD-10-CM | POA: Diagnosis present

## 2015-04-21 DIAGNOSIS — Z9889 Other specified postprocedural states: Secondary | ICD-10-CM

## 2015-04-21 DIAGNOSIS — E780 Pure hypercholesterolemia: Secondary | ICD-10-CM | POA: Insufficient documentation

## 2015-04-21 DIAGNOSIS — Z7982 Long term (current) use of aspirin: Secondary | ICD-10-CM | POA: Diagnosis not present

## 2015-04-21 DIAGNOSIS — E039 Hypothyroidism, unspecified: Secondary | ICD-10-CM | POA: Diagnosis not present

## 2015-04-21 DIAGNOSIS — I771 Stricture of artery: Secondary | ICD-10-CM | POA: Diagnosis present

## 2015-04-21 HISTORY — PX: PERIPHERAL VASCULAR CATHETERIZATION: SHX172C

## 2015-04-21 LAB — POCT I-STAT, CHEM 8
BUN: 22 mg/dL — ABNORMAL HIGH (ref 6–20)
CHLORIDE: 104 mmol/L (ref 101–111)
Calcium, Ion: 1.23 mmol/L (ref 1.13–1.30)
Creatinine, Ser: 1.3 mg/dL — ABNORMAL HIGH (ref 0.61–1.24)
GLUCOSE: 121 mg/dL — AB (ref 65–99)
HEMATOCRIT: 45 % (ref 39.0–52.0)
HEMOGLOBIN: 15.3 g/dL (ref 13.0–17.0)
POTASSIUM: 4.3 mmol/L (ref 3.5–5.1)
SODIUM: 143 mmol/L (ref 135–145)
TCO2: 27 mmol/L (ref 0–100)

## 2015-04-21 LAB — POCT ACTIVATED CLOTTING TIME
ACTIVATED CLOTTING TIME: 183 s
Activated Clotting Time: 270 seconds
Activated Clotting Time: 233 seconds
Activated Clotting Time: 159 seconds

## 2015-04-21 SURGERY — ABDOMINAL AORTOGRAM W/LOWER EXTREMITY
Anesthesia: LOCAL

## 2015-04-21 MED ORDER — LABETALOL HCL 5 MG/ML IV SOLN
10.0000 mg | INTRAVENOUS | Status: DC | PRN
  Administered 2015-04-21: 10 mg via INTRAVENOUS
  Filled 2015-04-21: qty 4

## 2015-04-21 MED ORDER — ACETAMINOPHEN 650 MG RE SUPP: 325.0000 mg | RECTAL | Status: DC | PRN

## 2015-04-21 MED ORDER — MORPHINE SULFATE 2 MG/ML IJ SOLN: 2.0000 mg | INTRAMUSCULAR | Status: DC | PRN

## 2015-04-21 MED ORDER — SODIUM CHLORIDE 0.45 % IV SOLN
INTRAVENOUS | Status: DC
  Administered 2015-04-21: 13:00:00 via INTRAVENOUS

## 2015-04-21 MED ORDER — DOCUSATE SODIUM 100 MG PO CAPS
100.0000 mg | ORAL_CAPSULE | Freq: Every day | ORAL | Status: DC
  Filled 2015-04-21: qty 1

## 2015-04-21 MED ORDER — ONDANSETRON HCL 4 MG/2ML IJ SOLN: 4.0000 mg | Freq: Four times a day (QID) | INTRAMUSCULAR | Status: DC | PRN

## 2015-04-21 MED ORDER — MORPHINE SULFATE 10 MG/ML IJ SOLN: 2.0000 mg | INTRAMUSCULAR | Status: DC | PRN

## 2015-04-21 MED ORDER — HEPARIN (PORCINE) IN NACL 2-0.9 UNIT/ML-% IJ SOLN
INTRAMUSCULAR | Status: AC
  Filled 2015-04-21: qty 1000

## 2015-04-21 MED ORDER — METOPROLOL TARTRATE 1 MG/ML IV SOLN: 2.0000 mg | INTRAVENOUS | Status: DC | PRN

## 2015-04-21 MED ORDER — ACETAMINOPHEN 325 MG PO TABS: 325.0000 mg | ORAL_TABLET | ORAL | Status: DC | PRN

## 2015-04-21 MED ORDER — LABETALOL HCL 5 MG/ML IV SOLN
INTRAVENOUS | Status: AC
  Filled 2015-04-21: qty 4

## 2015-04-21 MED ORDER — HEPARIN SODIUM (PORCINE) 1000 UNIT/ML IJ SOLN
INTRAMUSCULAR | Status: AC
  Filled 2015-04-21: qty 1

## 2015-04-21 MED ORDER — GUAIFENESIN-DM 100-10 MG/5ML PO SYRP
15.0000 mL | ORAL_SOLUTION | ORAL | Status: DC | PRN
Start: 2015-04-21 — End: 2015-04-22

## 2015-04-21 MED ORDER — CARVEDILOL 12.5 MG PO TABS
12.5000 mg | ORAL_TABLET | Freq: Two times a day (BID) | ORAL | Status: DC
  Administered 2015-04-21: 12.5 mg via ORAL
  Filled 2015-04-21 (×4): qty 1

## 2015-04-21 MED ORDER — HYDRALAZINE HCL 20 MG/ML IJ SOLN
5.0000 mg | INTRAMUSCULAR | Status: AC | PRN
  Administered 2015-04-21 (×2): 5 mg via INTRAVENOUS

## 2015-04-21 MED ORDER — SODIUM CHLORIDE 0.9 % IV SOLN: 500.0000 mL | Freq: Once | INTRAVENOUS | Status: AC | PRN

## 2015-04-21 MED ORDER — GADOBENATE DIMEGLUMINE 529 MG/ML IV SOLN
20.0000 mL | Freq: Once | INTRAVENOUS | Status: AC | PRN
  Administered 2015-04-21: 20 mL via INTRAVENOUS

## 2015-04-21 MED ORDER — SODIUM CHLORIDE 0.9 % IV SOLN: INTRAVENOUS | Status: DC

## 2015-04-21 MED ORDER — MORPHINE SULFATE 2 MG/ML IJ SOLN: 1.0000 mg | INTRAMUSCULAR | Status: DC | PRN

## 2015-04-21 MED ORDER — ALUM & MAG HYDROXIDE-SIMETH 200-200-20 MG/5ML PO SUSP: 15.0000 mL | ORAL | Status: DC | PRN

## 2015-04-21 MED ORDER — LIDOCAINE HCL (PF) 1 % IJ SOLN
INTRAMUSCULAR | Status: DC | PRN
  Administered 2015-04-21: 20 mL via INTRADERMAL

## 2015-04-21 MED ORDER — HEPARIN SODIUM (PORCINE) 1000 UNIT/ML IJ SOLN
INTRAMUSCULAR | Status: DC | PRN
  Administered 2015-04-21: 7000 [IU] via INTRAVENOUS

## 2015-04-21 MED ORDER — ASPIRIN 81 MG PO TABS
81.0000 mg | ORAL_TABLET | Freq: Every day | ORAL | Status: DC
  Filled 2015-04-21: qty 1

## 2015-04-21 MED ORDER — PANTOPRAZOLE SODIUM 40 MG PO TBEC
40.0000 mg | DELAYED_RELEASE_TABLET | Freq: Every day | ORAL | Status: DC
  Administered 2015-04-21: 40 mg via ORAL
  Filled 2015-04-21: qty 1

## 2015-04-21 MED ORDER — ISOSORBIDE MONONITRATE ER 30 MG PO TB24
30.0000 mg | ORAL_TABLET | Freq: Every day | ORAL | Status: DC
  Administered 2015-04-21: 30 mg via ORAL
  Filled 2015-04-21 (×2): qty 1

## 2015-04-21 MED ORDER — LABETALOL HCL 5 MG/ML IV SOLN
10.0000 mg | INTRAVENOUS | Status: DC | PRN
  Filled 2015-04-21: qty 4

## 2015-04-21 MED ORDER — HYDRALAZINE HCL 20 MG/ML IJ SOLN: 5.0000 mg | INTRAMUSCULAR | Status: DC | PRN

## 2015-04-21 MED ORDER — EZETIMIBE 10 MG PO TABS
10.0000 mg | ORAL_TABLET | Freq: Every day | ORAL | Status: DC
  Administered 2015-04-21: 10 mg via ORAL
  Filled 2015-04-21 (×2): qty 1

## 2015-04-21 MED ORDER — IODIXANOL 320 MG/ML IV SOLN
INTRAVENOUS | Status: DC | PRN
  Administered 2015-04-21: 175 mL via INTRA_ARTERIAL

## 2015-04-21 MED ORDER — CLOPIDOGREL BISULFATE 75 MG PO TABS: 75.0000 mg | ORAL_TABLET | Freq: Every day | ORAL | Status: DC

## 2015-04-21 MED ORDER — LEVOTHYROXINE SODIUM 50 MCG PO TABS
50.0000 ug | ORAL_TABLET | Freq: Every day | ORAL | Status: DC
  Administered 2015-04-22: 50 ug via ORAL
  Filled 2015-04-21 (×2): qty 1

## 2015-04-21 MED ORDER — LIDOCAINE HCL (PF) 1 % IJ SOLN
INTRAMUSCULAR | Status: AC
  Filled 2015-04-21: qty 30

## 2015-04-21 MED ORDER — PHENOL 1.4 % MT LIQD
1.0000 | OROMUCOSAL | Status: DC | PRN
  Filled 2015-04-21: qty 177

## 2015-04-21 MED ORDER — POTASSIUM CHLORIDE CRYS ER 10 MEQ PO TBCR
10.0000 meq | EXTENDED_RELEASE_TABLET | Freq: Every day | ORAL | Status: DC
  Administered 2015-04-21: 10 meq via ORAL
  Filled 2015-04-21 (×2): qty 1

## 2015-04-21 MED ORDER — OXYCODONE HCL 5 MG PO TABS: 5.0000 mg | ORAL_TABLET | Freq: Four times a day (QID) | ORAL | Status: DC | PRN

## 2015-04-21 MED ORDER — FUROSEMIDE 40 MG PO TABS
40.0000 mg | ORAL_TABLET | Freq: Two times a day (BID) | ORAL | Status: DC
  Administered 2015-04-21: 40 mg via ORAL
  Filled 2015-04-21 (×4): qty 1

## 2015-04-21 MED ORDER — HYDRALAZINE HCL 20 MG/ML IJ SOLN
INTRAMUSCULAR | Status: AC
  Filled 2015-04-21: qty 1

## 2015-04-21 SURGICAL SUPPLY — 11 items
CATH ANGIO 5F PIGTAIL 65CM (CATHETERS) ×2 IMPLANT
KIT ENCORE 26 ADVANTAGE (KITS) ×2 IMPLANT
KIT PV (KITS) ×2 IMPLANT
SHEATH BRITE TIP 7FR 35CM (SHEATH) ×2 IMPLANT
SHEATH PINNACLE 5F 10CM (SHEATH) ×2 IMPLANT
STENT GENESIS OPTA 7X18X80 (Permanent Stent) ×2 IMPLANT
SYR MEDRAD MARK V 150ML (SYRINGE) IMPLANT
TRANSDUCER W/STOPCOCK (MISCELLANEOUS) ×2 IMPLANT
TRAY PV CATH (CUSTOM PROCEDURE TRAY) ×2 IMPLANT
WIRE HITORQ VERSACORE ST 145CM (WIRE) ×2 IMPLANT
WIRE VERSACORE LOC 115CM (WIRE) ×2 IMPLANT

## 2015-04-21 NOTE — Progress Notes (Signed)
Site area: lt groin Site Prior to Removal:  Level 0 Pressure Applied For:  20 minutes Manual:   yes Patient Status During Pull:  stable Post Pull Site:  Level  0 Post Pull Instructions Given:  yes Post Pull Pulses Present:  Yes   Dressing Applied:  tegaderm Bedrest begins @ 1520 Comments:

## 2015-04-21 NOTE — H&P (View-Only) (Signed)
[facility] [place] HISTORY AND PHYSICAL   History of Present Illness:  Patient is a 81 y.o. year old male who presents for evaluation of bilateral lower extremity peripheral arterial disease. The patient has claudication symptoms in both legs that began after walking one to 200 feet. He describes pain in the calf in both legs. However he also states that he does have a history of chronic back pain and a T12 compression fracture that may be contributing to some of his pain. He previously had a stent placed in his right leg and a balloon angioplasty in his left leg. Both procedures were performed in [place] 2003. He denies rest pain. He denies nonhealing wounds. He currently smokes half a pack cigars a day. He is not interested in quitting. He was recently treated for lung cancer with radiation therapy in April of this year. He was turned down for resection of the cancer due to severe COPD with 44% reduced lung function by pulmonary function testing.  He recently has intermittently had some right upper quadrant abdominal pain especially after eating meals such as ice cream. He denies any pain in this area today. Other medical problems include coronary artery disease, hypertension, hypothyroidism, prior stroke, elevated cholesterol all of which are currently stable.  Past Medical History  Diagnosis Date  . Coronary artery disease   . Hypertension   . PAD (peripheral artery disease)   . Hypothyroidism   . Stroke 11/2008  . Pneumonia 09/2013  . Hypercholesteremia   . Solitary pulmonary nodule on lung CT 12/13/13   11/15/14  . Cancer     L lung    Past Surgical History  Procedure Laterality Date  . Knee arthroscopy Right   . Trigger finger release Bilateral   . Stent to right femoral artery    . Angioplasty to left femoral artery    . Coronary artery bypass graft      x4  . Carpal tunnel release Left 05/11/2014    Procedure: LEFT CARPAL TUNNEL RELEASE;  Surgeon: Carole Civil, MD;  Location: [facility]S;  Service: Orthopedics;  Laterality: Left;  . Left and right heart catheterization with coronary angiogram N/A 09/29/2013    Procedure: LEFT AND RIGHT HEART CATHETERIZATION WITH CORONARY ANGIOGRAM;  Surgeon: Burnell Blanks, MD;  Location: [facility] CATH LAB;  Service: Cardiovascular;  Laterality: N/A;  . Colonoscopy N/A 01/24/2015    Procedure: COLONOSCOPY;  Surgeon: Danie Binder, MD;  Location: [facility] ENDO SUITE;  Service: Endoscopy;  Laterality: N/A;  130    Social History History  Substance Use Topics  . Smoking status: Current Every Day Smoker -- 1.00 packs/day for 60 years    Types: Cigars, Cigarettes    Start date: 11/21/1949  . Smokeless tobacco: Never Used     Comment: former cigarette smoker/smoked cigarettes for 30 years  . Alcohol Use: No    Family History Family History  Problem Relation Age of Onset  . Heart attack Father   . Hyperlipidemia Father   . Diabetes Mother   . Colon cancer Neg Hx   . Diabetes Brother     Allergies  No Known Allergies   Current Outpatient Prescriptions  Medication Sig Dispense Refill  . aspirin 81 MG tablet Take 81 mg by mouth daily.    . carvedilol (COREG) 12.5 MG tablet Take 12.5 mg by mouth 2 (two) times daily with a meal.    . ezetimibe (ZETIA) 10 MG tablet Take 1 tablet (  10 mg total) by mouth daily. 30 tablet 11  . furosemide (LASIX) 40 MG tablet Take 40 mg by mouth 2 (two) times daily.    . isosorbide mononitrate (IMDUR) 30 MG 24 hr tablet Take 30 mg by mouth daily.    Marland Kitchen levothyroxine (SYNTHROID, LEVOTHROID) 50 MCG tablet Take 50 mcg by mouth daily before breakfast.    . potassium chloride (K-DUR,KLOR-CON) 10 MEQ tablet Take 1 tablet (10 mEq total) by mouth daily. 30 tablet 11   No current facility-administered medications for this visit.    ROS:   General:  No weight loss, Fever, chills  HEENT: No recent headaches, no nasal bleeding, no visual changes, no sore throat  Neurologic: No  dizziness, blackouts, seizures. No recent symptoms of stroke or mini- stroke. No recent episodes of slurred speech, or temporary blindness.  Cardiac: No recent episodes of chest pain/pressure, no shortness of breath at rest.  + shortness of breath with exertion.  Denies history of atrial fibrillation or irregular heartbeat  Vascular: No history of rest pain in feet.  + history of claudication.  No history of non-healing ulcer, No history of DVT   Pulmonary: No home oxygen, no productive cough, no hemoptysis,  No asthma or wheezing  Musculoskeletal:  '[ ]'$  Arthritis, [x ] Low back pain,  '[ ]'$  Joint pain  Hematologic:No history of hypercoagulable state.  No history of easy bleeding.  No history of anemia  Gastrointestinal: No hematochezia or melena,  No gastroesophageal reflux, no trouble swallowing  Urinary: '[ ]'$  chronic Kidney disease, '[ ]'$  on HD - '[ ]'$  MWF or '[ ]'$  TTHS, '[ ]'$  Burning with urination, '[ ]'$  Frequent urination, '[ ]'$  Difficulty urinating;   Skin: No rashes  Psychological: No history of anxiety,  No history of depression   Physical Examination  Filed Vitals:   04/13/15 0908  BP: 125/41  Pulse: 55  Height: '5\' 11"'$  (1.803 m)  Weight: 186 lb (84.369 kg)  SpO2: 99%    Body mass index is 25.95 kg/(m^2).  General:  Alert and oriented, no acute distress HEENT: Normal Neck: No bruit or JVD Pulmonary: Clear to auscultation bilaterally Cardiac: Regular Rate and Rhythm without murmur Abdomen: Soft, non-tender, non-distended, palpable mass right upper quadrant of abdomen consistent with possible calcified gallbladder, no scars Skin: No rash Extremity Pulses:  2+ radial, brachial, 1-2+ femoral bilateral, absent popliteal dorsalis pedis, posterior tibial pulses bilaterally Musculoskeletal: No deformity or edema  Neurologic: Upper and lower extremity motor 5/5 and symmetric  DATA:  She had bilateral ABIs performed at [facility] lab on 03/15/2015. ABI on the right was 0.29 left  was 0.44. He also had bilateral superficial femoral artery occlusions.   ASSESSMENT:  Patient with bilateral lower extremity claudication mild-to-moderate symptoms. He has known bilateral superficial femoral artery occlusions. He also has multiple known medical comorbidities.   PLAN:  #1 ultrasound right upper quadrant to further define right upper quadrant mass #2 aortogram bilateral extremity runoff possible intervention scheduled for May 27. Risks benefits possible complications and procedure details were discussed with the patient today including but not limited to bleeding infection vessel injury contrast reaction he understands and agrees to proceed.  Ruta Hinds, MD [facility] [place] Office: [phone] Pager: [phone]

## 2015-04-21 NOTE — Interval H&P Note (Signed)
History and Physical Interval Note:  04/21/2015 8:24 AM  Gary Odonnell  has presented today for surgery, with the diagnosis of pad  The various methods of treatment have been discussed with the patient and family. After consideration of risks, benefits and other options for treatment, the patient has consented to  Procedure(s): Abdominal Aortogram w/Lower Extremity (N/A) as a surgical intervention .  The patient's history has been reviewed, patient examined, no change in status, stable for surgery.  I have reviewed the patient's chart and labs.  Questions were answered to the patient's satisfaction.     Ruta Hinds

## 2015-04-21 NOTE — Discharge Summary (Signed)
Discharge Summary    Gary Odonnell 11-11-1932 81 y.o. male  [id number]  Admission Date: 04/21/2015  Discharge Date: 04/22/15  Physician: [name], MD  Admission Diagnosis: pad   HPI:   This is a 81 y.o. male who presents for evaluation of bilateral lower extremity peripheral arterial disease. The patient has claudication symptoms in both legs that began after walking one to 200 feet. He describes pain in the calf in both legs. However he also states that he does have a history of chronic back pain and a T12 compression fracture that may be contributing to some of his pain. He previously had a stent placed in his right leg and a balloon angioplasty in his left leg. Both procedures were performed in [place] 2003. He denies rest pain. He denies nonhealing wounds. He currently smokes half a pack cigars a day. He is not interested in quitting. He was recently treated for lung cancer with radiation therapy in April of this year. He was turned down for resection of the cancer due to severe COPD with 44% reduced lung function by pulmonary function testing. He recently has intermittently had some right upper quadrant abdominal pain especially after eating meals such as ice cream. He denies any pain in this area today. Other medical problems include coronary artery disease, hypertension, hypothyroidism, prior stroke, elevated cholesterol all of which are currently stable.  Hospital Course:  The patient was admitted to the hospital and taken to the [facility] lab on 04/21/2015 and underwent: Left EIA PTA+S    The pt tolerated the procedure well and was transported to the PACU in good condition.  The pt is started on Plavix.  He was admitted to the hospital b/c he did not have a ride home.    By POD 1, he was doing well and discharged home.  There was no evidence of access complication.  The remainder of the hospital course consisted of increasing mobilization and increasing intake of  solids without difficulty.  CBC    Component Value Date/Time   WBC 10.5 12/27/2014 0909   RBC 4.36 12/27/2014 0909   HGB 15.3 04/21/2015 0847   HCT 45.0 04/21/2015 0847   PLT 116* 12/27/2014 0909   MCV 94.5 12/27/2014 0909   MCH 32.3 12/27/2014 0909   MCHC 34.2 12/27/2014 0909   RDW 13.1 12/27/2014 0909   LYMPHSABS 1.5 09/26/2013 0840   MONOABS 0.5 09/26/2013 0840   EOSABS 0.1 09/26/2013 0840   BASOSABS 0.0 09/26/2013 0840    BMET    Component Value Date/Time   NA 143 04/21/2015 0847   K 4.3 04/21/2015 0847   CL 104 04/21/2015 0847   CO2 29 05/06/2014 1003   GLUCOSE 121* 04/21/2015 0847   BUN 22* 04/21/2015 0847   CREATININE 1.30* 04/21/2015 0847   CALCIUM 10.1 05/06/2014 1003   GFRNONAA 50* 05/06/2014 1003   GFRAA 58* 05/06/2014 1003      Discharge Instructions    Call MD for:  redness, tenderness, or signs of infection (pain, swelling, bleeding, redness, odor or green/yellow discharge around incision site)    Complete by:  As directed      Call MD for:  severe or increased pain, loss or decreased feeling  in affected limb(s)    Complete by:  As directed      Call MD for:  temperature >100.5    Complete by:  As directed      Discharge wound care:    Complete by:  As  directed   Shower daily with soap and water starting 04/22/15     Driving Restrictions    Complete by:  As directed   No driving for 24 hours and while taking pain medication.     Lifting restrictions    Complete by:  As directed   No lifting for 2 weeks     Resume previous diet    Complete by:  As directed            Discharge Diagnosis:  pad  Secondary Diagnosis: Patient Active Problem List   Diagnosis Date Noted  . Primary cancer of left lower lobe of lung 02/02/2015  . Abnormal PET scan of colon 01/04/2015  . Solitary pulmonary nodule on lung CT   . Carpal tunnel syndrome, left 05/03/2014  . Acute renal failure 09/27/2013  . HTN (hypertension) 09/26/2013  . CHF (congestive heart  failure) 09/26/2013  . CVA (cerebral infarction) 09/26/2013  . CAP (community acquired pneumonia) 09/26/2013  . CAD (coronary atherosclerotic disease) 09/26/2013  . S/P CABG x 4 09/26/2013   Past Medical History  Diagnosis Date  . Coronary artery disease   . Hypertension   . PAD (peripheral artery disease)   . Hypothyroidism   . Stroke 11/2008  . Pneumonia 09/2013  . Hypercholesteremia   . Solitary pulmonary nodule on lung CT 12/13/13   11/15/14  . Cancer     L lung       Medication List    TAKE these medications        aspirin 81 MG tablet  Take 81 mg by mouth daily.     carvedilol 12.5 MG tablet  Commonly known as:  COREG  Take 12.5 mg by mouth 2 (two) times daily with a meal.     clopidogrel 75 MG tablet  Commonly known as:  PLAVIX  Take 1 tablet (75 mg total) by mouth daily.     ezetimibe 10 MG tablet  Commonly known as:  ZETIA  Take 1 tablet (10 mg total) by mouth daily.     furosemide 40 MG tablet  Commonly known as:  LASIX  Take 40 mg by mouth 2 (two) times daily.     isosorbide mononitrate 30 MG 24 hr tablet  Commonly known as:  IMDUR  Take 30 mg by mouth daily.     levothyroxine 50 MCG tablet  Commonly known as:  SYNTHROID, LEVOTHROID  Take 50 mcg by mouth daily before breakfast.     potassium chloride 10 MEQ tablet  Commonly known as:  K-DUR,KLOR-CON  Take 1 tablet (10 mEq total) by mouth daily.        Prescriptions given: Plavix  Instructions: 1.  No driving x 24 hrs  Disposition: home  Patient's condition: is Good  Follow up: 1. Dr. Dereck Ligas will call to arrange appt    Leontine Locket, PA-C [facility] [phone] 04/21/2015  2:44 PM

## 2015-04-22 DIAGNOSIS — I70213 Atherosclerosis of native arteries of extremities with intermittent claudication, bilateral legs: Secondary | ICD-10-CM | POA: Diagnosis not present

## 2015-04-22 DIAGNOSIS — I771 Stricture of artery: Secondary | ICD-10-CM | POA: Diagnosis present

## 2015-04-22 NOTE — Progress Notes (Signed)
   Daily Progress Note  Assessment/Planning: POD #1 s/p L EIA PTA+S   No evidence of access complication  D/C to home  Subjective  - 1 Day Post-Op  No complaints  Objective Filed Vitals:   04/21/15 2002 04/21/15 2017 04/21/15 2202 04/22/15 0356  BP: 151/56 145/46 143/55 129/49  Pulse: 63 65 54 57  Temp: 97.8 F (36.6 C)  97.8 F (36.6 C) 97.8 F (36.6 C)  TempSrc: Oral  Oral Oral  Resp: '18  16 18  '$ Height:      Weight:      SpO2: 100%  99% 99%    Intake/Output Summary (Last 24 hours) at 04/22/15 0749 Last data filed at 04/22/15 4496  Gross per 24 hour  Intake 503.33 ml  Output    800 ml  Net -296.67 ml    PULM  CTAB CV  RRR GI  soft, NTND VASC  L groin without hematoma, no echymosis, L foot warm  Laboratory CBC    Component Value Date/Time   WBC 10.5 12/27/2014 0909   HGB 15.3 04/21/2015 0847   HCT 45.0 04/21/2015 0847   PLT 116* 12/27/2014 0909    BMET    Component Value Date/Time   NA 143 04/21/2015 0847   K 4.3 04/21/2015 0847   CL 104 04/21/2015 0847   CO2 29 05/06/2014 1003   GLUCOSE 121* 04/21/2015 0847   BUN 22* 04/21/2015 0847   CREATININE 1.30* 04/21/2015 0847   CALCIUM 10.1 05/06/2014 1003   GFRNONAA 50* 05/06/2014 1003   GFRAA 58* 05/06/2014 1003    Adele Barthel, MD [facility] [place] Office: [phone] Pager: [phone]  04/22/2015, 7:49 AM

## 2015-04-22 NOTE — Progress Notes (Signed)
Pt was discharged home with wife.  Alert and oriented x4.  No c/o pain.  IV D/Cd. Tele D/Cd.  Education given on diet, activity, meds, and follow-up care and instructions.  Pt verbalized understanding.

## 2015-04-25 MED FILL — Heparin Sodium (Porcine) 2 Unit/ML in Sodium Chloride 0.9%: INTRAMUSCULAR | Qty: 1000 | Status: AC

## 2015-04-26 ENCOUNTER — Telehealth: Payer: Self-pay | Admitting: Vascular Surgery

## 2015-04-26 NOTE — Telephone Encounter (Addendum)
-----   Message from Mena Goes, RN sent at 04/21/2015 12:19 PM EDT ----- Regarding: Schedule   ----- Message -----    From: [name], MD    Sent: 04/21/2015  12:00 PM      To: [facility] Charge Pool  Aortogram with bilat runoff [place] groin Left external iliac stent  Pt needs follow up appt in 6 weeks with bilat ABI  Ruta Hinds  04/25/15: spoke with pt, dpm

## 2015-05-19 ENCOUNTER — Other Ambulatory Visit: Payer: Self-pay | Admitting: Cardiology

## 2015-05-26 ENCOUNTER — Encounter: Payer: Self-pay | Admitting: Vascular Surgery

## 2015-06-01 ENCOUNTER — Ambulatory Visit (HOSPITAL_COMMUNITY)
Admission: RE | Admit: 2015-06-01 | Discharge: 2015-06-01 | Disposition: A | Discharge status: Home / Self Care | Payer: Medicare Other | Source: Ambulatory Visit | Attending: Vascular Surgery | Admitting: Vascular Surgery

## 2015-06-01 ENCOUNTER — Ambulatory Visit (INDEPENDENT_AMBULATORY_CARE_PROVIDER_SITE_OTHER): Payer: Medicare Other | Admitting: Vascular Surgery

## 2015-06-01 ENCOUNTER — Encounter: Payer: Self-pay | Admitting: Vascular Surgery

## 2015-06-01 VITALS — BP 112/54 | HR 60 | Ht 71.0 in | Wt 182.0 lb

## 2015-06-01 DIAGNOSIS — Z9889 Other specified postprocedural states: Secondary | ICD-10-CM

## 2015-06-01 DIAGNOSIS — I739 Peripheral vascular disease, unspecified: Secondary | ICD-10-CM

## 2015-06-01 DIAGNOSIS — I255 Ischemic cardiomyopathy: Secondary | ICD-10-CM | POA: Diagnosis not present

## 2015-06-01 NOTE — Progress Notes (Signed)
POST OPERATIVE OFFICE NOTE    CC:  F/u for surgery  HPI:  This is a 81 y.o. male who is s/p Abdominal aortogram with bilateral lower extremity runoff.   Operative findings: 80% stenosis left external iliac artery primary stented to 0% stenosis #2 bilateral superficial femoral artery occlusions with reconstitution of the above-knee popliteal artery and three-vessel runoff #3 patent right common iliac stent.  He states his left leg feels a little better.  He has more trouble with pain in the right LE with activity more than 10 min and he has to rest.  He doesn't have any wounds on either LE or history of wounds.     No Known Allergies  Current Outpatient Prescriptions  Medication Sig Dispense Refill  . aspirin 81 MG tablet Take 81 mg by mouth daily.    . carvedilol (COREG) 12.5 MG tablet Take 12.5 mg by mouth 2 (two) times daily with a meal.    . carvedilol (COREG) 25 MG tablet TAKE 1 TABLET TWICE A DAY WITH MEALS (DOSE INCREASE 11/11/2013) 180 tablet 1  . clopidogrel (PLAVIX) 75 MG tablet Take 1 tablet (75 mg total) by mouth daily. 42 tablet 0  . ezetimibe (ZETIA) 10 MG tablet Take 1 tablet (10 mg total) by mouth daily. 30 tablet 11  . furosemide (LASIX) 20 MG tablet TAKE 3 TABLETS DAILY 270 tablet 1  . furosemide (LASIX) 40 MG tablet Take 40 mg by mouth 2 (two) times daily.    . isosorbide mononitrate (IMDUR) 30 MG 24 hr tablet Take 30 mg by mouth daily.    Marland Kitchen levothyroxine (SYNTHROID, LEVOTHROID) 50 MCG tablet Take 50 mcg by mouth daily before breakfast.    . potassium chloride (K-DUR,KLOR-CON) 10 MEQ tablet Take 1 tablet (10 mEq total) by mouth daily. 30 tablet 11   No current facility-administered medications for this visit.     ROS:  See HPI  Physical Exam:  Filed Vitals:   06/01/15 1457  BP: 112/54  Pulse: 60    Incision:  Well healed  Extremities:  Palpable femoral pulses bilaterally.  Feet inspected clean and dry without any open wounds.  Warm to touch.  ABI's bilateral  LE monophasic flow Right 0.37 last ABI 0.29 pre-op Left 0.46 last ABI 0.44 pre-op   Assessment/Plan:  This is a 81 y.o. male who is s/p prior right common iliac stent recent left external iliac stent with both widely patent. He has bilateral superficial femoral artery occlusions with three-vessel runoff.  He is able to do things he wants like mow the grass and run the weed eater with several rest breaks.  He doe not want surgery at this time.  He will continue to do activities as tolerates.    We will see him back for repeat ABI's in 6 months if he has trouble or concerns he will call sooner.    Theda Sers, Ata Pecha MAUREEN PA-C [facility]   Clinic MD:  Pt seen and examined with Dr. Oneida Alar  History and exam findings as above. The patient still has bilateral lower extremity claudication. This is most likely due to his bilateral superficial femoral artery occlusions. These will not be amenable to percutaneous repair. The patient could be a candidate for bilateral femoral to popliteal bypasses if he wished to have his claudication symptoms improved more. He is currently satisfied with his current walking distance. He will follow-up with [place] in 6 months time for repeat ABIs.  Ruta Hinds, MD [facility] [place] Office: [phone] Pager: [phone]

## 2015-08-28 ENCOUNTER — Ambulatory Visit (INDEPENDENT_AMBULATORY_CARE_PROVIDER_SITE_OTHER): Payer: Medicare Other | Admitting: Cardiology

## 2015-08-28 ENCOUNTER — Encounter: Payer: Self-pay | Admitting: Cardiology

## 2015-08-28 VITALS — BP 136/78 | HR 56 | Ht 71.0 in | Wt 185.0 lb

## 2015-08-28 DIAGNOSIS — R739 Hyperglycemia, unspecified: Secondary | ICD-10-CM | POA: Diagnosis not present

## 2015-08-28 DIAGNOSIS — I5022 Chronic systolic (congestive) heart failure: Secondary | ICD-10-CM

## 2015-08-28 DIAGNOSIS — E039 Hypothyroidism, unspecified: Secondary | ICD-10-CM

## 2015-08-28 DIAGNOSIS — I255 Ischemic cardiomyopathy: Secondary | ICD-10-CM

## 2015-08-28 DIAGNOSIS — I10 Essential (primary) hypertension: Secondary | ICD-10-CM | POA: Diagnosis not present

## 2015-08-28 DIAGNOSIS — I2510 Atherosclerotic heart disease of native coronary artery without angina pectoris: Secondary | ICD-10-CM | POA: Diagnosis not present

## 2015-08-28 MED ORDER — FUROSEMIDE 20 MG PO TABS: 20.0000 mg | ORAL_TABLET | Freq: Every day | ORAL | Status: DC

## 2015-08-28 NOTE — Patient Instructions (Signed)
   Change your Lasix to '20mg'$  DAILY   Call office back to confirm dose on your Coreg.  Labs for CMET, Magnesium, CBC, FLP, TSH, free T4, HgA1C - Reminder:  Nothing to eat or drink after 12 midnight prior to labs.  Office will contact with results via phone or letter.    Your physician wants you to follow up in: 6 months.  You will receive a reminder letter in the mail one-two months in advance.  If you don't receive a letter, please call our office to schedule the follow up appointment

## 2015-08-28 NOTE — Progress Notes (Addendum)
Patient ID: Gary Odonnell, male   DOB: [birth year], 81 y.o.   MRN: [id number]     Clinical Summary Gary Odonnell is a 81 y.o.male seen today for follow up of the following medical problems.   1. CAD/ICM  - history of prior CABG 2003 in [place]  - 09/2013 admission to [facility] with decompensated heart failure. Workup included an echo which showed an LVEF of 15%. He was transferred to [facility] for further management.  - cath 09/2013 showed LM 30%, LAD 100% proximal, LCX 50% ostial, RCA 100% mid with distal vessel filling with right to right and left to right collaterals. LIMA-LAD patent, graft from [place] to OM1, OM2, PDA occluded. No PCI targets, recommendations for medical management. RHC mean PA 43, wedge 22, CI 1.74.  - repeat echo 04/2014 shows LVEF improved to 40-45%.    - denies any chest pain. No SOB or DOE. No LE edema - compliant with meds  2. Hyperlipidemia  - compliant with zetia  - lipitor and crestor have caused prior muscle aches, not on statin   3. Lung CA - has just completed radiation treatment.  - followed by Dr Dwana Curd  4. PAD - followed by vascular, previous stenting to left external iliac and right common iliac.  - being considered for possible lower extremity bypass, however he is not currently in favor  5. Carotid stenosis - repeat US 01/2015 with only mild bilateral disease.  - denies any neuro symptoms  6. Hypothyroid -stopped synthroid on his own, he was worried about the dosing of the pill and decided to stop it   Past Medical History  Diagnosis Date  . Coronary artery disease   . Hypertension   . PAD (peripheral artery disease)   . Hypothyroidism   . Stroke 11/2008  . Pneumonia 09/2013  . Hypercholesteremia   . Solitary pulmonary nodule on lung CT 12/13/13   11/15/14  . Cancer     L lung     No Known Allergies   Current Outpatient Prescriptions  Medication Sig Dispense Refill  . aspirin 81 MG tablet Take 81 mg by mouth daily.     . carvedilol (COREG) 12.5 MG tablet Take 12.5 mg by mouth 2 (two) times daily with a meal.    . carvedilol (COREG) 25 MG tablet TAKE 1 TABLET TWICE A DAY WITH MEALS (DOSE INCREASE 11/11/2013) 180 tablet 1  . clopidogrel (PLAVIX) 75 MG tablet Take 1 tablet (75 mg total) by mouth daily. 42 tablet 0  . ezetimibe (ZETIA) 10 MG tablet Take 1 tablet (10 mg total) by mouth daily. 30 tablet 11  . furosemide (LASIX) 20 MG tablet TAKE 3 TABLETS DAILY 270 tablet 1  . furosemide (LASIX) 40 MG tablet Take 40 mg by mouth 2 (two) times daily.    . isosorbide mononitrate (IMDUR) 30 MG 24 hr tablet Take 30 mg by mouth daily.    Marland Kitchen levothyroxine (SYNTHROID, LEVOTHROID) 50 MCG tablet Take 50 mcg by mouth daily before breakfast.    . potassium chloride (K-DUR,KLOR-CON) 10 MEQ tablet Take 1 tablet (10 mEq total) by mouth daily. 30 tablet 11   No current facility-administered medications for this visit.     Past Surgical History  Procedure Laterality Date  . Knee arthroscopy Right   . Trigger finger release Bilateral   . Stent to right femoral artery    . Angioplasty to left femoral artery    . Coronary artery bypass graft  x4  . Carpal tunnel release Left 05/11/2014    Procedure: LEFT CARPAL TUNNEL RELEASE;  Surgeon: Carole Civil, MD;  Location: [facility] ORS;  Service: Orthopedics;  Laterality: Left;  . Left and right heart catheterization with coronary angiogram N/A 09/29/2013    Procedure: LEFT AND RIGHT HEART CATHETERIZATION WITH CORONARY ANGIOGRAM;  Surgeon: Burnell Blanks, MD;  Location: [facility] CATH LAB;  Service: Cardiovascular;  Laterality: N/A;  . Colonoscopy N/A 01/24/2015    Procedure: COLONOSCOPY;  Surgeon: Danie Binder, MD;  Location: [facility] ENDO SUITE;  Service: Endoscopy;  Laterality: N/A;  130  . Peripheral vascular catheterization N/A 04/21/2015    Procedure: Abdominal Aortogram w/Lower Extremity;  Surgeon: [name], MD;  Location: [facility] CV LAB;  Service: Cardiovascular;   Laterality: N/A;     No Known Allergies    Family History  Problem Relation Age of Onset  . Heart attack Father   . Hyperlipidemia Father   . Diabetes Mother   . Colon cancer Neg Hx   . Diabetes Brother      Social History Gary Odonnell reports that he has been smoking Cigars and Cigarettes.  He started smoking about 65 years ago. He has a 60 pack-year smoking history. He has never used smokeless tobacco. Gary Odonnell reports that he does not drink alcohol.   Review of Systems CONSTITUTIONAL: No weight loss, fever, chills, weakness or fatigue.  HEENT: Eyes: No visual loss, blurred vision, double vision or yellow sclerae.No hearing loss, sneezing, congestion, runny nose or sore throat.  SKIN: No rash or itching.  CARDIOVASCULAR: per hpi RESPIRATORY: No shortness of breath, cough or sputum.  GASTROINTESTINAL: No anorexia, nausea, vomiting or diarrhea. No abdominal pain or blood.  GENITOURINARY: No burning on urination, no polyuria NEUROLOGICAL: No headache, dizziness, syncope, paralysis, ataxia, numbness or tingling in the extremities. No change in bowel or bladder control.  MUSCULOSKELETAL: No muscle, back pain, joint pain or stiffness.  LYMPHATICS: No enlarged nodes. No history of splenectomy.  PSYCHIATRIC: No history of depression or anxiety.  ENDOCRINOLOGIC: No reports of sweating, cold or heat intolerance. No polyuria or polydipsia.  Marland Kitchen   Physical Examination Filed Vitals:   08/28/15 0945  BP: 136/78  Pulse: 56   Filed Vitals:   08/28/15 0945  Height: '5\' 11"'$  (1.803 m)  Weight: 185 lb (83.915 kg)    Gen: resting comfortably, no acute distress HEENT: no scleral icterus, pupils equal round and reactive, no palptable cervical adenopathy,  CV: RRR, no m/r/g, no jvd Resp: Clear to auscultation bilaterally GI: abdomen is soft, non-tender, non-distended, normal bowel sounds, no hepatosplenomegaly MSK: extremities are warm, no edema.  Skin: warm, no rash Neuro:  no focal  deficits Psych: appropriate affect   Diagnostic Studies  09/29/13 Cath  Hemodynamic Findings:  Ao: 162/70  LV: 163/19/29  RA: 12  RV: 66/7/16  PA: 67/26 (mean 43)  PCWP: 22  Fick Cardiac Output: 3.5 L/min  Fick Cardiac Index: 1.74 L/min/m2  Central Aortic Saturation: 91%  Pulmonary Artery Saturation: 50%  Angiographic Findings:  Left main: Distal 30% stenosis.  Left Anterior Descending Artery: 100% proximal occlusion. Proximal, mid and distal vessel fills from the patent IMA graft.  Circumflex Artery: Large caliber vessel with large intermediate Emory Leaver. Diffuse non-obstructive plaque in the large bifurcating intermediate Johany Hansman. The ostium of the Circumflex has 50% stenosis.  Right Coronary Artery: Large dominant vessel with 100% mid occlusion. The distal vessel fills right to right bridging collaterals and left to right collaterals.  Graft Anatomy:  LIMA to LAD is patent  Free Radial (? Y graft from LIMA) to OM1, OM2, PDA is occluded.  Left Ventricular Angiogram: Deferred.  Impression:  1. Triple vessel CAD with patent LIMA to LAD, occluded RCA with good collateral filling, moderate disease in Circumflex  2. No focal targets for PCI  3. Ischemic cardiomyopathy with acute systolic CHF   23/9/53 Echo  LVEF 15-20%, moderate LVH, severe diffuse hypokinesis, restrictive diastolic dysfunction, multiple WMAs, moderate MR, severe RV dysfunction RV TAPSE 0.7 cm, PASP 61 mmHg.   12/2013 Lab  TC 175 TG 111 LDL 116 HDL 37  TSH 7.7 HgbA1c 6.6 Na 143 K 4.5 Cr 1.22 GFR56   04/2014 Echo Study Conclusions  - Left ventricle: The cavity size was normal. Wall thickness was increased in a pattern of mild LVH. Systolic function was mildly to moderately reduced. The estimated ejection fraction was in the range of 40% to 45%. There is diastolic dysfunction, indeterminant grade. - Regional wall motion abnormality: Hypokinesis of the mid anterior and basal-mid  anteroseptal myocardium. - Aortic valve: Moderately calcified annulus. Trileaflet; moderately thickened leaflets. Valve area (VTI): 2.48 cm^2. Valve area (Vmax): 2.23 cm^2. - Mitral valve: Moderately calcified annulus. Mildly thickened leaflets . - Left atrium: The atrium was moderately dilated. - Right atrium: The atrium was mildly dilated. - Technically adequate study.   01/2015 Carotid US bilaterall 1-39%   Assessment and Plan  1. CAD/ICM  - previously severe LV systolic dysfunction, LVEF 15-20%. Repeat echo shows improved function at 40-45%. He is NYHA II. Last cath described above.  - continue current meds. Still unclear why his ACE-I was stopped at some point, if repeat labs ok likely will restart.  - he also is unsure of how he is taking his coreg at home, he is to call to update our office on his dosing. - he is taking lasix only '20mg'$  daily, cut himself down from '60mg'$  daily. Seems to be doing well, will change his Rx to '20mg'$  daily, instructed ok to take extra on days he develops swelling or weight gain.   2. Hyperlipidemia  - intolerant to statins, continue zetia.  - follow up lipid panel  3. PAD - continue to follow with vascular   4. Carotid stenosis - mild bilateral disease by [place] 01/2015, continue to follow  5. HTN - at goal, continue current meds  6. Hypothyroid - will repeat studies, he stopped his thyroid meds on his own recently due to confusion about the dosing.    F/u 6 months  Arnoldo Lenis, M.D.   10/02/15 Addendum Patient being considered for LE bypass surgery. From cardiac standpoint he has no active acute conditions. Cath 2014 as reported above with no pci targets. LVEF has significantly improved and is now just mildly decreased. Recommend proceeding with surgery as planned. May hold ASA if needed, continue other cardiac meds in periop period.    Zandra Abts MD

## 2015-08-30 ENCOUNTER — Telehealth: Payer: Self-pay | Admitting: *Deleted

## 2015-08-30 NOTE — Telephone Encounter (Signed)
Patient was seen on 08/28/2015 by Dr. Harl Bowie.  Was told to call office back regarding his dose on the Coreg.  Stated that his Coreg is '25mg'$  - 1/2 tab twice a day.  Also, stated that he is not taking Plavix (thought Dr. Harl Bowie stopped this) and Imdur (not sure what happened to this one).

## 2015-08-31 NOTE — Telephone Encounter (Signed)
Confirmed with pt that he is taking ASA 81 mg daily. Updated medication list.

## 2015-08-31 NOTE — Telephone Encounter (Signed)
Please update his med list. Current doses are fine. Ok not to be on plavix and imdur. Please varify however that he is taking a daily aspirin '81mg'$  daily   Zandra Abts MD

## 2015-09-04 ENCOUNTER — Telehealth: Payer: Self-pay

## 2015-09-04 NOTE — Telephone Encounter (Signed)
Phone call from pt.  Requested to try and schedule his leg bypass surgery between Thanksgiving and Christmas; voiced concern about icy weather, if he waits until January to be reevaluated.  Advised that he will need to f/u with Dr. Oneida Alar, first, since his last office visit was July 2016.  Advised will have an appt. scheduler to call back, regarding moving his January appt. for ABI's and OV with Dr. Oneida Alar, to an earlier date.  Agreed with plan.

## 2015-09-15 ENCOUNTER — Encounter: Payer: Self-pay | Admitting: *Deleted

## 2015-09-19 ENCOUNTER — Other Ambulatory Visit: Payer: Self-pay | Admitting: *Deleted

## 2015-09-19 ENCOUNTER — Telehealth: Payer: Self-pay | Admitting: *Deleted

## 2015-09-19 DIAGNOSIS — I739 Peripheral vascular disease, unspecified: Secondary | ICD-10-CM

## 2015-09-19 NOTE — Telephone Encounter (Signed)
Pt aware, will come to office for copy of lab results

## 2015-09-19 NOTE — Telephone Encounter (Signed)
-----   Message from Arnoldo Lenis, MD sent at 09/19/2015 11:25 AM EDT ----- Labs look good, submitted to be scanned in  Zandra Abts MD

## 2015-09-20 ENCOUNTER — Ambulatory Visit (HOSPITAL_COMMUNITY)
Admission: RE | Admit: 2015-09-20 | Discharge: 2015-09-20 | Disposition: A | Discharge status: Home / Self Care | Payer: Medicare Other | Source: Ambulatory Visit | Attending: Vascular Surgery | Admitting: Vascular Surgery

## 2015-09-20 DIAGNOSIS — I739 Peripheral vascular disease, unspecified: Secondary | ICD-10-CM | POA: Diagnosis present

## 2015-09-26 ENCOUNTER — Encounter: Payer: Self-pay | Admitting: Vascular Surgery

## 2015-09-28 ENCOUNTER — Other Ambulatory Visit: Payer: Self-pay

## 2015-09-28 ENCOUNTER — Ambulatory Visit (INDEPENDENT_AMBULATORY_CARE_PROVIDER_SITE_OTHER): Payer: Medicare Other | Admitting: Vascular Surgery

## 2015-09-28 ENCOUNTER — Encounter: Payer: Self-pay | Admitting: Vascular Surgery

## 2015-09-28 VITALS — BP 171/70 | HR 62 | Ht 71.0 in | Wt 190.4 lb

## 2015-09-28 DIAGNOSIS — I255 Ischemic cardiomyopathy: Secondary | ICD-10-CM | POA: Diagnosis not present

## 2015-09-28 DIAGNOSIS — I70213 Atherosclerosis of native arteries of extremities with intermittent claudication, bilateral legs: Secondary | ICD-10-CM | POA: Diagnosis not present

## 2015-09-28 NOTE — Progress Notes (Signed)
CC:  F/u for surgery  HPI:  This is a 81 y.o. male who is s/p  Left external iliac stent. He has known bilateral superficial femoral artery occlusions. He has bilateral reconstitution of the below-knee popliteal artery with three-vessel runoff. His claudication symptoms have improved slightly in the left leg with he is still fairly debilitated overall by right leg claudication symptoms. 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   He states his left leg feels a little better.  He has more trouble with pain in the right LE with activity more than 10 min and he has to rest.  He doesn't have any wounds on either LE or history of wounds.    Unfortunately he continues to smoke. I counseled him against this today. I also discussed with him that the durability of any revascularization would be limited if he continues to smoke.  No Known Allergies    Current Outpatient Prescriptions   Medication  Sig  Dispense  Refill   .  aspirin 81 MG tablet  Take 81 mg by mouth daily.       .  carvedilol (COREG) 12.5 MG tablet  Take 12.5 mg by mouth 2 (two) times daily with a meal.       .  carvedilol (COREG) 25 MG tablet  TAKE 1 TABLET TWICE A DAY WITH MEALS (DOSE INCREASE 11/11/2013)  180 tablet  1   .  clopidogrel (PLAVIX) 75 MG tablet  Take 1 tablet (75 mg total) by mouth daily.  42 tablet  0   .  ezetimibe (ZETIA) 10 MG tablet  Take 1 tablet (10 mg total) by mouth daily.  30 tablet  11   .  furosemide (LASIX) 20 MG tablet  TAKE 3 TABLETS DAILY  270 tablet  1   .  furosemide (LASIX) 40 MG tablet  Take 40 mg by mouth 2 (two) times daily.       .  isosorbide mononitrate (IMDUR) 30 MG 24 hr tablet  Take 30 mg by mouth daily.       Marland Kitchen  levothyroxine (SYNTHROID, LEVOTHROID) 50 MCG tablet  Take 50 mcg by mouth daily before breakfast.       .  potassium chloride (K-DUR,KLOR-CON) 10 MEQ tablet  Take 1 tablet (10 mEq total) by mouth daily.  30 tablet  11      No current  facility-administered medications for this visit.      ROS:  See HPI  Physical Exam:    Filed Vitals:   09/28/15 1559  BP: 171/70  Pulse: 62  Height: '5\' 11"'$  (1.803 m)  Weight: 190 lb 6.4 oz (86.365 kg)  SpO2: 95%   Incision:  Well healed   Extremities:  Palpable femoral pulses bilaterally.  Feet inspected clean and dry without any open wounds.  Warm to touch.  Chest: Clear to auscultation bilaterally Cardiac: Regular rate and rhythm  ABI's bilateral LE monophasic flow Right 0.37 last ABI 0.29 pre-op Left 0.46 last ABI 0.44 pre-op   Assessment/Plan:  This is an 81 y.o. male who is s/p prior right common iliac stent recent left external iliac stent with both widely patent. He has bilateral superficial femoral artery occlusions with three-vessel runoff. He is limited overall by his claudication.    At this point he wishes a right femoral to popliteal bypass. This  will be a right femoral to below-knee popliteal bypass. We will  obtain cardiac risk stratification prior to his procedure. Again he was counseled to quit smoking. He will also get bilateral lower extremity vein mapping prior to his procedure. Tentatively right femoral to below-knee popliteal bypass on 10/23/2015. Plan will be to stop his Plavix 5-7 days prior to the procedure.  Ruta Hinds, MD [facility] Office: [phone] Pager: [phone]

## 2015-09-29 ENCOUNTER — Telehealth: Payer: Self-pay | Admitting: *Deleted

## 2015-09-29 NOTE — Addendum Note (Signed)
Addended by: Dorthula Rue L on: 09/29/2015 09:42 AM   Modules accepted: Orders

## 2015-09-29 NOTE — Telephone Encounter (Signed)
Dr. Oneida Alar requesting cardiac clearance for right fem-pop on 10/23/15. Pt was seen 08/28/15, does this pt need to be seen again?

## 2015-10-02 MED ORDER — LISINOPRIL 2.5 MG PO TABS: 2.5000 mg | ORAL_TABLET | Freq: Every day | ORAL | Status: DC

## 2015-10-02 NOTE — Telephone Encounter (Signed)
Patient is ok for surgery, I will add addendum to my last clinic note. Please forward to his surgeon. Patient should also start lisionpril 2.'5mg'$  daily, his last labs look good and this medication helps strengthen his heart.  Zandra Abts MD

## 2015-10-02 NOTE — Telephone Encounter (Signed)
Office note sent to Dr. Oneida Alar, lisinopril 2.5 sent to [facility] as requested by pt.

## 2015-10-10 ENCOUNTER — Other Ambulatory Visit: Payer: Self-pay

## 2015-10-11 ENCOUNTER — Encounter (HOSPITAL_COMMUNITY): Payer: Self-pay

## 2015-10-11 ENCOUNTER — Encounter (HOSPITAL_COMMUNITY)
Admission: RE | Admit: 2015-10-11 | Discharge: 2015-10-11 | Disposition: A | Discharge status: Home / Self Care | Payer: Medicare Other | Source: Ambulatory Visit | Attending: Vascular Surgery | Admitting: Vascular Surgery

## 2015-10-11 ENCOUNTER — Ambulatory Visit (HOSPITAL_COMMUNITY)
Admission: RE | Admit: 2015-10-11 | Discharge: 2015-10-11 | Disposition: A | Discharge status: Home / Self Care | Payer: Medicare Other | Source: Ambulatory Visit | Attending: Vascular Surgery | Admitting: Vascular Surgery

## 2015-10-11 ENCOUNTER — Other Ambulatory Visit: Payer: Self-pay | Admitting: Vascular Surgery

## 2015-10-11 DIAGNOSIS — I739 Peripheral vascular disease, unspecified: Secondary | ICD-10-CM | POA: Diagnosis not present

## 2015-10-11 DIAGNOSIS — E039 Hypothyroidism, unspecified: Secondary | ICD-10-CM | POA: Insufficient documentation

## 2015-10-11 DIAGNOSIS — I70213 Atherosclerosis of native arteries of extremities with intermittent claudication, bilateral legs: Secondary | ICD-10-CM | POA: Insufficient documentation

## 2015-10-11 DIAGNOSIS — Z01810 Encounter for preprocedural cardiovascular examination: Secondary | ICD-10-CM

## 2015-10-11 DIAGNOSIS — I2510 Atherosclerotic heart disease of native coronary artery without angina pectoris: Secondary | ICD-10-CM | POA: Insufficient documentation

## 2015-10-11 DIAGNOSIS — Z8673 Personal history of transient ischemic attack (TIA), and cerebral infarction without residual deficits: Secondary | ICD-10-CM | POA: Diagnosis not present

## 2015-10-11 DIAGNOSIS — E7800 Pure hypercholesterolemia, unspecified: Secondary | ICD-10-CM | POA: Insufficient documentation

## 2015-10-11 DIAGNOSIS — I10 Essential (primary) hypertension: Secondary | ICD-10-CM | POA: Insufficient documentation

## 2015-10-11 HISTORY — DX: Reserved for concepts with insufficient information to code with codable children: IMO0002

## 2015-10-11 LAB — CBC
HCT: 45 % (ref 39.0–52.0)
HEMOGLOBIN: 14.9 g/dL (ref 13.0–17.0)
MCH: 31.5 pg (ref 26.0–34.0)
MCHC: 33.1 g/dL (ref 30.0–36.0)
MCV: 95.1 fL (ref 78.0–100.0)
Platelets: 148 10*3/uL — ABNORMAL LOW (ref 150–400)
RBC: 4.73 MIL/uL (ref 4.22–5.81)
RDW: 13.2 % (ref 11.5–15.5)
WBC: 6.8 10*3/uL (ref 4.0–10.5)

## 2015-10-11 LAB — COMPREHENSIVE METABOLIC PANEL
ALBUMIN: 4 g/dL (ref 3.5–5.0)
ALK PHOS: 62 U/L (ref 38–126)
ALT: 18 U/L (ref 17–63)
ANION GAP: 8 (ref 5–15)
AST: 18 U/L (ref 15–41)
BILIRUBIN TOTAL: 0.9 mg/dL (ref 0.3–1.2)
BUN: 15 mg/dL (ref 6–20)
CALCIUM: 10.1 mg/dL (ref 8.9–10.3)
CO2: 27 mmol/L (ref 22–32)
Chloride: 104 mmol/L (ref 101–111)
Creatinine, Ser: 1.25 mg/dL — ABNORMAL HIGH (ref 0.61–1.24)
GFR calc non Af Amer: 53 mL/min — ABNORMAL LOW (ref >60)
Glucose, Bld: 85 mg/dL (ref 65–99)
POTASSIUM: 4.5 mmol/L (ref 3.5–5.1)
SODIUM: 139 mmol/L (ref 135–145)
TOTAL PROTEIN: 7.3 g/dL (ref 6.5–8.1)

## 2015-10-11 LAB — URINALYSIS, ROUTINE W REFLEX MICROSCOPIC
BILIRUBIN URINE: NEGATIVE
Glucose, UA: NEGATIVE mg/dL
HGB URINE DIPSTICK: NEGATIVE
KETONES UR: NEGATIVE mg/dL
Leukocytes, UA: NEGATIVE
Nitrite: NEGATIVE
PH: 5.5 (ref 5.0–8.0)
Protein, ur: NEGATIVE mg/dL
SPECIFIC GRAVITY, URINE: 1.007 (ref 1.005–1.030)

## 2015-10-11 LAB — SURGICAL PCR SCREEN
MRSA, PCR: POSITIVE — AB
STAPHYLOCOCCUS AUREUS: POSITIVE — AB

## 2015-10-11 LAB — APTT: APTT: 34 s (ref 24–37)

## 2015-10-11 LAB — PROTIME-INR
INR: 1.07 (ref 0.00–1.49)
Prothrombin Time: 14.1 seconds (ref 11.6–15.2)

## 2015-10-11 LAB — ABO/RH: ABO/RH(D): O NEG
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
## 2015-10-11 NOTE — Progress Notes (Signed)
Mupirocin Ointment Rx called into [facility] in [place] for positive PCR of Staph and MRSA. Pt notified and voiced understanding.

## 2015-10-11 NOTE — Pre-Procedure Instructions (Signed)
    Gary Odonnell  10/11/2015      [facility] - [place], [place] [address] [place] [address] Phone: [phone] Fax: [phone]  [facility] [place], [place], [place] [address] Phone: [phone] Fax: [phone]  [facility] [address] [place], [place] [address] [place] [address] Phone: [phone] Fax: [phone]    Your procedure is scheduled on Monday, Nov.28  Report to [facility] Admitting at 5:30A.M.  Call this number if you have problems the morning of surgery:  [phone]              Any questions  prior to surgery call [phone] Monday-Friday 8am-4pm   Remember:  Do not eat food or drink liquids after midnight Sunday , Nov 27  Take these medicines the morning of surgery with A SIP OF WATER: carvedilol (coreg), aspirin -per DR.Scot Dock             Stop nsaids: advil, motrin, ibuprofen, aleve 1 week prior to surgery.   Do not wear jewelry.  Do not wear lotions, powders, or perfumes.  You may not wear deodorant.  Do not shave 48 hours prior to surgery.  Men may shave face and neck.  Do not bring valuables to the hospital.  [facility] is not responsible for any belongings or valuables.  Contacts, dentures or bridgework may not be worn into surgery.  Leave your suitcase in the car.  After surgery it may be brought to your room.  For patients admitted to the hospital, discharge time will be determined by your treatment team.  Patients discharged the day of surgery will not be allowed to drive home.   Name and phone number of your driver:   Special instructions: review preparing for surgery  Please read over the following fact sheets that you were given. Pain Booklet, Coughing and Deep Breathing, Blood Transfusion Information and Surgical Site Infection Prevention

## 2015-10-12 LAB — TYPE AND SCREEN
ABO/RH(D): O NEG
ANTIBODY SCREEN: NEGATIVE

## 2015-10-12 NOTE — Progress Notes (Signed)
Anesthesia Chart Review:  Pt is 81 year old male scheduled for R fem-pop bypass graft on 10/23/2015 with Dr. Oneida Alar.   Cardiologist is Dr. Carlyle Dolly, last office visit 08/28/15.  PMH includes:  CAD (s/p CABG 2003), HTN, PAD, hypothyroidism, lung cancer, stroke (2010). Current smoker. BMI 25.5. S/p L carpal tunnel release 05/11/14.   Medications include: ASA, carvedilol, zetia, lasix, lisinopril, potassium  Preoperative labs reviewed.    1 view CXR 12/27/14: No acute chest findings  EKG 12/05/14: Sinus  Rhythm -First degree A-V block  -With rate variation. Nonspecific QRS widening and anterior fascicular block. Poor R-wave progression -nonspecific -consider old anterior infarct. Negative T-waves  -Possible  Anterolateral  ischemia.   Carotid duplex US 02/08/15: Heterogeneous plaque, bilaterally. 1-39% bilateral ICA stenosis.  Echo 05/04/14:  - Left ventricle: The cavity size was normal. Wall thickness was increased in a pattern of mild LVH. Systolic function was mildly to moderately reduced. The estimated ejection fraction was in the range of 40% to 45%. There is diastolic dysfunction, indeterminant grade. - Regional wall motion abnormality: Hypokinesis of the mid anterior and basal-mid anteroseptal myocardium. - Aortic valve: Moderately calcified annulus. Trileaflet; moderately thickened leaflets. Valve area (VTI): 2.48 cm^2. Valve area (Vmax): 2.23 cm^2. - Mitral valve: Moderately calcified annulus. Mildly thickened leaflets . - Left atrium: The atrium was moderately dilated. - Right atrium: The atrium was mildly dilated. - Technically adequate study.  Cardiac cath 09/29/13:  1. Triple vessel CAD with patent LIMA to LAD, occluded RCA with good collateral filling, moderate disease in Circumflex (50%) 2. No focal targets for PCI 3. Ischemic cardiomyopathy with acute systolic CHF Recommendations: Continue medical management.  Pt has cardiac clearance from Dr. Harl Bowie in [place] note dated  08/28/15.   If no changes, I anticipate pt can proceed with surgery as scheduled.   Willeen Cass, FNP-BC [facility]/Anesthesiology Phone: [phone] 10/12/2015 2:23 PM

## 2015-10-21 DIAGNOSIS — I70211 Atherosclerosis of native arteries of extremities with intermittent claudication, right leg: Secondary | ICD-10-CM

## 2015-10-22 MED ORDER — DEXTROSE 5 % IV SOLN
1.5000 g | INTRAVENOUS | Status: AC
  Administered 2015-10-23: 1.5 g via INTRAVENOUS
  Filled 2015-10-22: qty 1.5

## 2015-10-23 ENCOUNTER — Inpatient Hospital Stay (HOSPITAL_COMMUNITY)
Admission: RE | Admit: 2015-10-23 | Discharge: 2015-10-25 | DRG: 253 | Disposition: A | Discharge status: Home / Self Care | Payer: Medicare Other | Source: Ambulatory Visit | Attending: Vascular Surgery | Admitting: Vascular Surgery

## 2015-10-23 ENCOUNTER — Encounter (HOSPITAL_COMMUNITY)
Admission: RE | Disposition: A | Discharge status: Home / Self Care | Payer: Self-pay | Source: Ambulatory Visit | Attending: Vascular Surgery

## 2015-10-23 ENCOUNTER — Inpatient Hospital Stay (HOSPITAL_COMMUNITY): Payer: Medicare Other | Admitting: Vascular Surgery

## 2015-10-23 ENCOUNTER — Encounter (HOSPITAL_COMMUNITY): Payer: Self-pay | Admitting: *Deleted

## 2015-10-23 ENCOUNTER — Inpatient Hospital Stay (HOSPITAL_COMMUNITY): Payer: Medicare Other | Admitting: Certified Registered Nurse Anesthetist

## 2015-10-23 ENCOUNTER — Inpatient Hospital Stay (HOSPITAL_COMMUNITY): Payer: Medicare Other

## 2015-10-23 DIAGNOSIS — Z7982 Long term (current) use of aspirin: Secondary | ICD-10-CM | POA: Diagnosis not present

## 2015-10-23 DIAGNOSIS — Z951 Presence of aortocoronary bypass graft: Secondary | ICD-10-CM | POA: Diagnosis not present

## 2015-10-23 DIAGNOSIS — Z8673 Personal history of transient ischemic attack (TIA), and cerebral infarction without residual deficits: Secondary | ICD-10-CM

## 2015-10-23 DIAGNOSIS — Z419 Encounter for procedure for purposes other than remedying health state, unspecified: Secondary | ICD-10-CM

## 2015-10-23 DIAGNOSIS — F17200 Nicotine dependence, unspecified, uncomplicated: Secondary | ICD-10-CM | POA: Diagnosis present

## 2015-10-23 DIAGNOSIS — I743 Embolism and thrombosis of arteries of the lower extremities: Secondary | ICD-10-CM | POA: Diagnosis present

## 2015-10-23 DIAGNOSIS — Z85118 Personal history of other malignant neoplasm of bronchus and lung: Secondary | ICD-10-CM

## 2015-10-23 DIAGNOSIS — E039 Hypothyroidism, unspecified: Secondary | ICD-10-CM | POA: Diagnosis present

## 2015-10-23 DIAGNOSIS — Z7902 Long term (current) use of antithrombotics/antiplatelets: Secondary | ICD-10-CM

## 2015-10-23 DIAGNOSIS — Z9889 Other specified postprocedural states: Secondary | ICD-10-CM

## 2015-10-23 DIAGNOSIS — I70213 Atherosclerosis of native arteries of extremities with intermittent claudication, bilateral legs: Secondary | ICD-10-CM | POA: Diagnosis present

## 2015-10-23 DIAGNOSIS — E7800 Pure hypercholesterolemia, unspecified: Secondary | ICD-10-CM | POA: Diagnosis present

## 2015-10-23 DIAGNOSIS — I2510 Atherosclerotic heart disease of native coronary artery without angina pectoris: Secondary | ICD-10-CM | POA: Diagnosis present

## 2015-10-23 DIAGNOSIS — N289 Disorder of kidney and ureter, unspecified: Secondary | ICD-10-CM | POA: Diagnosis present

## 2015-10-23 DIAGNOSIS — I10 Essential (primary) hypertension: Secondary | ICD-10-CM | POA: Diagnosis present

## 2015-10-23 DIAGNOSIS — Z923 Personal history of irradiation: Secondary | ICD-10-CM | POA: Diagnosis not present

## 2015-10-23 DIAGNOSIS — I739 Peripheral vascular disease, unspecified: Secondary | ICD-10-CM | POA: Diagnosis present

## 2015-10-23 HISTORY — PX: FEMORAL-POPLITEAL BYPASS GRAFT: SHX937

## 2015-10-23 HISTORY — PX: INTRAOPERATIVE ARTERIOGRAM: SHX5157

## 2015-10-23 HISTORY — PX: VEIN HARVEST: SHX6363

## 2015-10-23 LAB — CREATININE, SERUM
CREATININE: 1.29 mg/dL — AB (ref 0.61–1.24)
GFR, EST AFRICAN AMERICAN: 59 mL/min — AB (ref >60)
GFR, EST NON AFRICAN AMERICAN: 51 mL/min — AB (ref >60)

## 2015-10-23 LAB — CBC
HEMATOCRIT: 38.9 % — AB (ref 39.0–52.0)
Hemoglobin: 12.9 g/dL — ABNORMAL LOW (ref 13.0–17.0)
MCH: 31.2 pg (ref 26.0–34.0)
MCHC: 33.2 g/dL (ref 30.0–36.0)
MCV: 94.2 fL (ref 78.0–100.0)
PLATELETS: 125 10*3/uL — AB (ref 150–400)
RBC: 4.13 MIL/uL — ABNORMAL LOW (ref 4.22–5.81)
RDW: 13.6 % (ref 11.5–15.5)
WBC: 8.3 10*3/uL (ref 4.0–10.5)

## 2015-10-23 SURGERY — BYPASS GRAFT FEMORAL-POPLITEAL ARTERY
Anesthesia: General | Laterality: Right

## 2015-10-23 MED ORDER — CHLORHEXIDINE GLUCONATE CLOTH 2 % EX PADS: 6.0000 | MEDICATED_PAD | Freq: Once | CUTANEOUS | Status: DC

## 2015-10-23 MED ORDER — FENTANYL CITRATE (PF) 250 MCG/5ML IJ SOLN
INTRAMUSCULAR | Status: AC
  Filled 2015-10-23: qty 5

## 2015-10-23 MED ORDER — ACETAMINOPHEN 325 MG PO TABS: 325.0000 mg | ORAL_TABLET | ORAL | Status: DC | PRN

## 2015-10-23 MED ORDER — ARTIFICIAL TEARS OP OINT
TOPICAL_OINTMENT | OPHTHALMIC | Status: AC
  Filled 2015-10-23: qty 3.5

## 2015-10-23 MED ORDER — LIDOCAINE HCL (CARDIAC) 20 MG/ML IV SOLN
INTRAVENOUS | Status: AC
  Filled 2015-10-23: qty 5

## 2015-10-23 MED ORDER — DOCUSATE SODIUM 100 MG PO CAPS
100.0000 mg | ORAL_CAPSULE | Freq: Every day | ORAL | Status: DC
  Administered 2015-10-24 – 2015-10-25 (×2): 100 mg via ORAL
  Filled 2015-10-23 (×2): qty 1

## 2015-10-23 MED ORDER — LIDOCAINE HCL (CARDIAC) 20 MG/ML IV SOLN
INTRAVENOUS | Status: DC | PRN
  Administered 2015-10-23: 20 mg via INTRAVENOUS

## 2015-10-23 MED ORDER — VECURONIUM BROMIDE 10 MG IV SOLR
INTRAVENOUS | Status: DC | PRN
  Administered 2015-10-23: 2 mg via INTRAVENOUS
  Administered 2015-10-23: 3 mg via INTRAVENOUS

## 2015-10-23 MED ORDER — MORPHINE SULFATE (PF) 2 MG/ML IV SOLN
2.0000 mg | INTRAVENOUS | Status: DC | PRN
Start: 2015-10-23 — End: 2015-10-25

## 2015-10-23 MED ORDER — SUCCINYLCHOLINE CHLORIDE 20 MG/ML IJ SOLN
INTRAMUSCULAR | Status: AC
  Filled 2015-10-23: qty 1

## 2015-10-23 MED ORDER — CARVEDILOL 12.5 MG PO TABS
12.5000 mg | ORAL_TABLET | Freq: Two times a day (BID) | ORAL | Status: DC
  Administered 2015-10-24 – 2015-10-25 (×3): 12.5 mg via ORAL
  Filled 2015-10-23 (×5): qty 1

## 2015-10-23 MED ORDER — SODIUM CHLORIDE 0.9 % IV SOLN: 500.0000 mL | Freq: Once | INTRAVENOUS | Status: DC | PRN

## 2015-10-23 MED ORDER — THROMBIN 20000 UNITS EX SOLR
CUTANEOUS | Status: AC
  Filled 2015-10-23: qty 20000

## 2015-10-23 MED ORDER — ROCURONIUM BROMIDE 100 MG/10ML IV SOLN
INTRAVENOUS | Status: DC | PRN
  Administered 2015-10-23: 50 mg via INTRAVENOUS

## 2015-10-23 MED ORDER — POTASSIUM CHLORIDE CRYS ER 20 MEQ PO TBCR: 20.0000 meq | EXTENDED_RELEASE_TABLET | Freq: Every day | ORAL | Status: DC | PRN

## 2015-10-23 MED ORDER — HEPARIN SODIUM (PORCINE) 1000 UNIT/ML IJ SOLN
INTRAMUSCULAR | Status: AC
  Filled 2015-10-23: qty 1

## 2015-10-23 MED ORDER — ACETAMINOPHEN 650 MG RE SUPP: 325.0000 mg | RECTAL | Status: DC | PRN

## 2015-10-23 MED ORDER — EZETIMIBE 10 MG PO TABS
10.0000 mg | ORAL_TABLET | Freq: Every day | ORAL | Status: DC
Start: 2015-10-23 — End: 2015-10-25
  Administered 2015-10-23 – 2015-10-25 (×3): 10 mg via ORAL
  Filled 2015-10-23 (×3): qty 1

## 2015-10-23 MED ORDER — PROPOFOL 10 MG/ML IV BOLUS
INTRAVENOUS | Status: AC
  Filled 2015-10-23: qty 40

## 2015-10-23 MED ORDER — ENOXAPARIN SODIUM 40 MG/0.4ML ~~LOC~~ SOLN
40.0000 mg | SUBCUTANEOUS | Status: DC
  Administered 2015-10-24 – 2015-10-25 (×2): 40 mg via SUBCUTANEOUS
  Filled 2015-10-23 (×3): qty 0.4

## 2015-10-23 MED ORDER — SENNOSIDES-DOCUSATE SODIUM 8.6-50 MG PO TABS: 1.0000 | ORAL_TABLET | Freq: Every evening | ORAL | Status: DC | PRN

## 2015-10-23 MED ORDER — GLYCOPYRROLATE 0.2 MG/ML IJ SOLN
INTRAMUSCULAR | Status: AC
  Filled 2015-10-23: qty 1

## 2015-10-23 MED ORDER — VECURONIUM BROMIDE 10 MG IV SOLR
INTRAVENOUS | Status: AC
  Filled 2015-10-23: qty 10

## 2015-10-23 MED ORDER — 0.9 % SODIUM CHLORIDE (POUR BTL) OPTIME
TOPICAL | Status: DC | PRN
  Administered 2015-10-23: 2000 mL

## 2015-10-23 MED ORDER — MIDAZOLAM HCL 2 MG/2ML IJ SOLN: 0.5000 mg | Freq: Once | INTRAMUSCULAR | Status: DC | PRN

## 2015-10-23 MED ORDER — EPHEDRINE SULFATE 50 MG/ML IJ SOLN
INTRAMUSCULAR | Status: AC
  Filled 2015-10-23: qty 1

## 2015-10-23 MED ORDER — FENTANYL CITRATE (PF) 100 MCG/2ML IJ SOLN
INTRAMUSCULAR | Status: DC | PRN
  Administered 2015-10-23 (×2): 25 ug via INTRAVENOUS
  Administered 2015-10-23: 50 ug via INTRAVENOUS
  Administered 2015-10-23: 150 ug via INTRAVENOUS
  Administered 2015-10-23: 100 ug via INTRAVENOUS

## 2015-10-23 MED ORDER — ONDANSETRON HCL 4 MG/2ML IJ SOLN: 4.0000 mg | Freq: Four times a day (QID) | INTRAMUSCULAR | Status: DC | PRN

## 2015-10-23 MED ORDER — DEXTROSE 5 % IV SOLN
1.5000 g | Freq: Two times a day (BID) | INTRAVENOUS | Status: AC
  Administered 2015-10-23 – 2015-10-24 (×2): 1.5 g via INTRAVENOUS
  Filled 2015-10-23 (×2): qty 1.5

## 2015-10-23 MED ORDER — IOHEXOL 300 MG/ML  SOLN
INTRAMUSCULAR | Status: DC | PRN
  Administered 2015-10-23: 50 mL

## 2015-10-23 MED ORDER — NEOSTIGMINE METHYLSULFATE 10 MG/10ML IV SOLN
INTRAVENOUS | Status: DC | PRN
  Administered 2015-10-23: 4 mg via INTRAVENOUS

## 2015-10-23 MED ORDER — FUROSEMIDE 20 MG PO TABS
20.0000 mg | ORAL_TABLET | Freq: Every day | ORAL | Status: DC
  Administered 2015-10-23 – 2015-10-25 (×3): 20 mg via ORAL
  Filled 2015-10-23 (×3): qty 1

## 2015-10-23 MED ORDER — SODIUM CHLORIDE 0.9 % IV SOLN
INTRAVENOUS | Status: DC | PRN
  Administered 2015-10-23: 08:00:00

## 2015-10-23 MED ORDER — SODIUM CHLORIDE 0.9 % IV SOLN: INTRAVENOUS | Status: DC

## 2015-10-23 MED ORDER — PROMETHAZINE HCL 25 MG/ML IJ SOLN: 6.2500 mg | INTRAMUSCULAR | Status: DC | PRN

## 2015-10-23 MED ORDER — METOPROLOL TARTRATE 1 MG/ML IV SOLN: 2.0000 mg | INTRAVENOUS | Status: DC | PRN

## 2015-10-23 MED ORDER — STERILE WATER FOR INJECTION IJ SOLN
INTRAMUSCULAR | Status: AC
  Filled 2015-10-23: qty 10

## 2015-10-23 MED ORDER — ALUM & MAG HYDROXIDE-SIMETH 200-200-20 MG/5ML PO SUSP: 15.0000 mL | ORAL | Status: DC | PRN

## 2015-10-23 MED ORDER — POTASSIUM CHLORIDE IN NACL 20-0.9 MEQ/L-% IV SOLN
INTRAVENOUS | Status: DC
  Administered 2015-10-23 – 2015-10-24 (×2): via INTRAVENOUS
  Filled 2015-10-23 (×4): qty 1000

## 2015-10-23 MED ORDER — FENTANYL CITRATE (PF) 100 MCG/2ML IJ SOLN: 25.0000 ug | INTRAMUSCULAR | Status: DC | PRN

## 2015-10-23 MED ORDER — ASPIRIN EC 81 MG PO TBEC
81.0000 mg | DELAYED_RELEASE_TABLET | Freq: Every day | ORAL | Status: DC
  Administered 2015-10-24 – 2015-10-25 (×2): 81 mg via ORAL
  Filled 2015-10-23 (×2): qty 1

## 2015-10-23 MED ORDER — LABETALOL HCL 5 MG/ML IV SOLN: 10.0000 mg | INTRAVENOUS | Status: DC | PRN

## 2015-10-23 MED ORDER — HEPARIN SODIUM (PORCINE) 1000 UNIT/ML IJ SOLN
INTRAMUSCULAR | Status: DC | PRN
  Administered 2015-10-23: 8000 [IU] via INTRAVENOUS
  Administered 2015-10-23: 3000 [IU] via INTRAVENOUS

## 2015-10-23 MED ORDER — GUAIFENESIN-DM 100-10 MG/5ML PO SYRP: 15.0000 mL | ORAL_SOLUTION | ORAL | Status: DC | PRN

## 2015-10-23 MED ORDER — ALBUTEROL SULFATE HFA 108 (90 BASE) MCG/ACT IN AERS
INHALATION_SPRAY | RESPIRATORY_TRACT | Status: DC | PRN
  Administered 2015-10-23: 4 via RESPIRATORY_TRACT

## 2015-10-23 MED ORDER — PHENOL 1.4 % MT LIQD
1.0000 | OROMUCOSAL | Status: DC | PRN
Start: 2015-10-23 — End: 2015-10-25

## 2015-10-23 MED ORDER — PROTAMINE SULFATE 10 MG/ML IV SOLN
INTRAVENOUS | Status: AC
  Filled 2015-10-23: qty 5

## 2015-10-23 MED ORDER — NEOSTIGMINE METHYLSULFATE 10 MG/10ML IV SOLN
INTRAVENOUS | Status: AC
  Filled 2015-10-23: qty 1

## 2015-10-23 MED ORDER — ROCURONIUM BROMIDE 50 MG/5ML IV SOLN
INTRAVENOUS | Status: AC
  Filled 2015-10-23: qty 1

## 2015-10-23 MED ORDER — EPHEDRINE SULFATE 50 MG/ML IJ SOLN
INTRAMUSCULAR | Status: DC | PRN
  Administered 2015-10-23: 5 mg via INTRAVENOUS

## 2015-10-23 MED ORDER — GLYCOPYRROLATE 0.2 MG/ML IJ SOLN
INTRAMUSCULAR | Status: DC | PRN
  Administered 2015-10-23: 0.6 mg via INTRAVENOUS

## 2015-10-23 MED ORDER — PANTOPRAZOLE SODIUM 40 MG PO TBEC
40.0000 mg | DELAYED_RELEASE_TABLET | Freq: Every day | ORAL | Status: DC
  Administered 2015-10-23 – 2015-10-25 (×3): 40 mg via ORAL
  Filled 2015-10-23 (×3): qty 1

## 2015-10-23 MED ORDER — HYDRALAZINE HCL 20 MG/ML IJ SOLN: 5.0000 mg | INTRAMUSCULAR | Status: DC | PRN

## 2015-10-23 MED ORDER — GLYCOPYRROLATE 0.2 MG/ML IJ SOLN
INTRAMUSCULAR | Status: AC
  Filled 2015-10-23: qty 3

## 2015-10-23 MED ORDER — ASPIRIN EC 81 MG PO TBEC: 81.0000 mg | DELAYED_RELEASE_TABLET | Freq: Every day | ORAL | Status: DC

## 2015-10-23 MED ORDER — LISINOPRIL 2.5 MG PO TABS
2.5000 mg | ORAL_TABLET | Freq: Every day | ORAL | Status: DC
Start: 2015-10-23 — End: 2015-10-25
  Administered 2015-10-23 – 2015-10-25 (×3): 2.5 mg via ORAL
  Filled 2015-10-23 (×3): qty 1

## 2015-10-23 MED ORDER — BISACODYL 5 MG PO TBEC: 5.0000 mg | DELAYED_RELEASE_TABLET | Freq: Every day | ORAL | Status: DC | PRN

## 2015-10-23 MED ORDER — LACTATED RINGERS IV SOLN
INTRAVENOUS | Status: DC | PRN
  Administered 2015-10-23 (×3): via INTRAVENOUS

## 2015-10-23 MED ORDER — MEPERIDINE HCL 25 MG/ML IJ SOLN: 6.2500 mg | INTRAMUSCULAR | Status: DC | PRN

## 2015-10-23 MED ORDER — MAGNESIUM SULFATE 2 GM/50ML IV SOLN
2.0000 g | Freq: Every day | INTRAVENOUS | Status: DC | PRN
  Filled 2015-10-23: qty 50

## 2015-10-23 MED ORDER — ONDANSETRON HCL 4 MG/2ML IJ SOLN
INTRAMUSCULAR | Status: DC | PRN
  Administered 2015-10-23: 4 mg via INTRAVENOUS

## 2015-10-23 MED ORDER — OXYCODONE-ACETAMINOPHEN 5-325 MG PO TABS
1.0000 | ORAL_TABLET | ORAL | Status: DC | PRN
  Filled 2015-10-23: qty 1

## 2015-10-23 MED ORDER — PROPOFOL 10 MG/ML IV BOLUS
INTRAVENOUS | Status: DC | PRN
  Administered 2015-10-23: 80 mg via INTRAVENOUS

## 2015-10-23 SURGICAL SUPPLY — 42 items
CANISTER SUCTION 2500CC (MISCELLANEOUS) ×2 IMPLANT
CANNULA VESSEL 3MM 2 BLNT TIP (CANNULA) ×4 IMPLANT
CLIP TI MEDIUM 24 (CLIP) ×2 IMPLANT
CLIP TI WIDE RED SMALL 24 (CLIP) ×2 IMPLANT
DRAPE X-RAY CASS 24X20 (DRAPES) ×2 IMPLANT
ELECT REM PT RETURN 9FT ADLT (ELECTROSURGICAL) ×2
ELECTRODE REM PT RTRN 9FT ADLT (ELECTROSURGICAL) ×1 IMPLANT
GAUZE SPONGE 4X4 16PLY XRAY LF (GAUZE/BANDAGES/DRESSINGS) ×2 IMPLANT
GLOVE BIO SURGEON STRL SZ7.5 (GLOVE) ×2 IMPLANT
GLOVE BIOGEL PI IND STRL 6.5 (GLOVE) ×3 IMPLANT
GLOVE BIOGEL PI IND STRL 7.0 (GLOVE) ×2 IMPLANT
GLOVE BIOGEL PI IND STRL 7.5 (GLOVE) ×2 IMPLANT
GLOVE BIOGEL PI INDICATOR 6.5 (GLOVE) ×3
GLOVE BIOGEL PI INDICATOR 7.0 (GLOVE) ×2
GLOVE BIOGEL PI INDICATOR 7.5 (GLOVE) ×2
GLOVE ECLIPSE 7.0 STRL STRAW (GLOVE) ×2 IMPLANT
GLOVE ECLIPSE 7.5 STRL STRAW (GLOVE) ×2 IMPLANT
GOWN STRL REUS W/ TWL LRG LVL3 (GOWN DISPOSABLE) ×4 IMPLANT
GOWN STRL REUS W/TWL LRG LVL3 (GOWN DISPOSABLE) ×4
KIT BASIN OR (CUSTOM PROCEDURE TRAY) ×2 IMPLANT
KIT ROOM TURNOVER OR (KITS) ×2 IMPLANT
LIQUID BAND (GAUZE/BANDAGES/DRESSINGS) ×6 IMPLANT
NS IRRIG 1000ML POUR BTL (IV SOLUTION) ×6 IMPLANT
PACK PERIPHERAL VASCULAR (CUSTOM PROCEDURE TRAY) ×2 IMPLANT
PAD ARMBOARD 7.5X6 YLW CONV (MISCELLANEOUS) ×2 IMPLANT
SET COLLECT BLD 21X3/4 12 (NEEDLE) ×2 IMPLANT
STOPCOCK 4 WAY LG BORE MALE ST (IV SETS) ×2 IMPLANT
SUT PROLENE 5 0 C 1 24 (SUTURE) ×2 IMPLANT
SUT PROLENE 6 0 CC (SUTURE) ×2 IMPLANT
SUT PROLENE 7 0 BV 1 (SUTURE) ×6 IMPLANT
SUT SILK 2 0 SH (SUTURE) ×2 IMPLANT
SUT SILK 3 0 (SUTURE) ×2
SUT SILK 3-0 18XBRD TIE 12 (SUTURE) ×2 IMPLANT
SUT VIC AB 3-0 SH 27 (SUTURE) ×8
SUT VIC AB 3-0 SH 27X BRD (SUTURE) ×8 IMPLANT
SUT VIC AB 4-0 PS2 27 (SUTURE) ×4 IMPLANT
TAPE UMBILICAL COTTON 1/8X30 (MISCELLANEOUS) ×2 IMPLANT
TRAY FOLEY CATH SILVER 14FR (SET/KITS/TRAYS/PACK) ×2 IMPLANT
TRAY FOLEY W/METER SILVER 16FR (SET/KITS/TRAYS/PACK) IMPLANT
TUBING EXTENTION W/L.L. (IV SETS) ×2 IMPLANT
UNDERPAD 30X30 INCONTINENT (UNDERPADS AND DIAPERS) ×2 IMPLANT
WATER STERILE IRR 1000ML POUR (IV SOLUTION) ×2 IMPLANT

## 2015-10-23 NOTE — Anesthesia Postprocedure Evaluation (Signed)
Anesthesia Post Note  Patient: Gary Odonnell  Procedure(s) Performed: Procedure(s) (LRB): BYPASS GRAFT FEMORAL TO BELOW KNEE POPLITEAL ARTERY USING RIGHT NON-REVERSED GREATER SAPPHENOUS VEIN (Right) VEIN HARVEST - RIGHT GREATER SAPPHENOUS (Right) INTRA OPERATIVE ARTERIOGRAM - RIGHT LOWER LEG (Right)  Patient location during evaluation: PACU Anesthesia Type: General Level of consciousness: awake and alert, oriented and patient cooperative Pain management: pain level controlled Vital Signs Assessment: post-procedure vital signs reviewed and stable Respiratory status: spontaneous breathing, nonlabored ventilation, respiratory function stable and patient connected to nasal cannula oxygen Cardiovascular status: blood pressure returned to baseline and stable Postop Assessment: No signs of nausea or vomiting Anesthetic complications: no    Last Vitals:  Filed Vitals:   10/23/15 1330 10/23/15 1345  BP: 138/47 149/47  Pulse: 51 51  Temp:    Resp: 14 14    Last Pain: There were no vitals filed for this visit.  LLE Motor Response: Responds to commands LLE Sensation: Full sensation RLE Motor Response: Responds to commands RLE Sensation: Full sensation      Nichlos Kunzler,E. Modene Andy

## 2015-10-23 NOTE — Anesthesia Procedure Notes (Signed)
Procedure Name: Intubation Date/Time: 10/23/2015 7:46 AM Performed by: [place] Pink Pre-anesthesia Checklist: Patient identified, Emergency Drugs available, Suction available, Patient being monitored and Timeout performed Patient Re-evaluated:Patient Re-evaluated prior to inductionOxygen Delivery Method: Circle system utilized Preoxygenation: Pre-oxygenation with 100% oxygen Intubation Type: IV induction Ventilation: Oral airway inserted - appropriate to patient size and Two handed mask ventilation required Laryngoscope Size: Mac and 4 Grade View: Grade I Tube type: Oral Tube size: 7.5 mm Number of attempts: 1 Airway Equipment and Method: Stylet Placement Confirmation: ETT inserted through vocal cords under direct vision,  positive ETCO2 and breath sounds checked- equal and bilateral Secured at: 22 cm Tube secured with: Tape Dental Injury: Teeth and Oropharynx as per pre-operative assessment

## 2015-10-23 NOTE — Interval H&P Note (Signed)
History and Physical Interval Note:  10/23/2015 7:26 AM  Gary Odonnell  has presented today for surgery, with the diagnosis of Peripheral vascular disease with bilateral lower extremity claudication I70.213  The various methods of treatment have been discussed with the patient and family. After consideration of risks, benefits and other options for treatment, the patient has consented to  Procedure(s): BYPASS GRAFT FEMORAL-POPLITEAL ARTERY (Right) as a surgical intervention .  The patient's history has been reviewed, patient examined, no change in status, stable for surgery.  I have reviewed the patient's chart and labs.  Questions were answered to the patient's satisfaction.     Ruta Hinds

## 2015-10-23 NOTE — Progress Notes (Signed)
  [facility] Day of Surgery Note  Subjective:  No complaints.   Filed Vitals:   10/23/15 1422 10/23/15 1457  BP:  143/48  Pulse: 50   Temp:  98.1 F (36.7 C)  Resp: 13    Incisions: right leg incisions clean and intact. Minimal oozing at distal incision mid thigh incisions. No hematoma.  Extremities: strong biphasic doppler flow right DP and PT  Assessment/Plan:  This is a 81 y.o. male who is s/p femoral to below knee popliteal bypass with right non-reversed greater saphenous vein.   Bypass is patent.  Stable post-op.    Virgina Jock, [place] Pager: [phone] 10/23/2015 3:12 PM

## 2015-10-23 NOTE — Op Note (Signed)
Procedure: Right femoral to below knee popliteal bypass with non-reversed ipsilateral great saphenous vein, intraop angio  Preoperative diagnosis: claudication right leg  Postoperative diagnosis: Same  Anesthesia: General  Asst.: Gerri Lins PA-C  Operative findings:     Good quality saphenous vein 3 mm  Operative details: After obtaining informed consent, the patient was taken to the operating room. The patient was placed in supine position on the operating room table. After induction of general anesthesia and endotracheal intubation, a Foley catheter was placed. Next, the patient's entire right lower extremity was prepped and draped in the usual sterile fashion. A longitudinal incision was then made in the right groin and carried down through the subcutaneous tissues to expose the right common femoral artery.   The common femoral artery was dissected free circumferentially. There was a pulse within the common femoral artery it was thickened anteriorly with some posterior calcified plaque. The distal external iliac artery was dissected free circumferentially underneath the inguinal ligament. There was a good pulse within this.   A vessel loop was also placed around the distal external iliac artery. The profunda was dissected free circumferentially.  The SFA was also dissected free circumferentially and a vessel loop placed around this.  Next the saphenofemoral junction was identified in the medial portion of the groin incision and this was harvested through several skip incisions on the medial aspect of the leg.  Side branches were ligated and divided between silk ties or clips.  The vein was fairly uniform 3 mm diameter.  The vein harvest incision was deepened into the fascia at the below knee segment and the below knee popliteal space was entered.  The popliteal artery was dissected free circumferentially.  It was soft on palpation.  A tunnel was then created between the heads of the  gastrocnemius muscle subsartorial up to the groin.  The vein was ligated distally and at the saphenofemoral junction with a 2 0 silk tie.  The vein was gently distended with heparinized saline and inspected for hemostasis.    The patient was given 8000 units of heparin.  After appropriate circulation time, the distal right external iliac artery was controlled with a vessel loop as well as the profunda and SFA.  The vein was placed in a non reversed configuration.  A longitudinal opening was made in the common femoral artery.  There was friable plaque on the anterior wall and calcified plaque distally.  The vein was spatulated and sewn end to side to the artery using a running 6 0 Prolene.  Just prior to completion of the anastomosis everything was forebled backbled and thoroughly flushed. Proximal clamp and distal clamps were removed and there was good pulsatile flow in the profunda femoris artery immediately. The SFA was chronically occluded. The vein valves were lysed with a valvulotome.   The graft was then brought through the subsartorial tunnel down to the below-knee popliteal artery after marking for orientation. The below-knee popliteal artery was controlled proximally and distally with a fine bulldog clamp. A longitudinal opening was made in the distal below-knee popliteal artery in an area that was fairly free of calcification. The graft was then cut to length and spatulated and sewn end of graft to side of artery using running 6-0 Prolene suture.   At completion of the anastomosis everything was forebled backbled and thoroughly flushed. The remainder of the anastomosis was completed and all clamps were removed restoring pulsatile flow to the below-knee popliteal artery. An intraoperative arteriogram was obtained with  a 21 gauge butterfly in the proximal vein graft.  This showed a patent distal anastomosis with 3 vessel runoff in the proximal visualized segments. The patient had biphasic Doppler flow in  the posterior tibial and monophasic flow in the dorsalis pedis areas of the foot.  One repair stitch was placed in the lateral wall of the proximal anastomosis of the patch on the profunda and at the posterior wall of the common femoral at a prior area of bleeding.  The patient had been given an additional 3000 units of heparin during the case.    After hemostasis was obtained, the deep layers and subcutaneous layers of the below-knee popliteal incision were closed with running 3-0 Vicryl suture. The skin was closed with a 4 0 vicryl subcuticular stitch.   The saphenectomy incisions were closed with running 3 0 vicryl follow by 4 0 vicryl subcuticular.  The groin was inspected and found to be hemostatic. This was then closed in multiple layers of running 2 0 and 3-0 Vicryl suture and 4-0 subcuticular stitch. The patient tolerated the procedure well and there were no complications. Instrument sponge and needle counts were correct at the end of the case. Patient was taken to the recovery room in stable condition.  Ruta Hinds, MD [facility] Office: [phone] Pager: [phone]

## 2015-10-23 NOTE — H&P (View-Only) (Signed)
CC:  F/u for surgery  HPI:  This is a 81 y.o. male who is s/p  Left external iliac stent. He has known bilateral superficial femoral artery occlusions. He has bilateral reconstitution of the below-knee popliteal artery with three-vessel runoff. His claudication symptoms have improved slightly in the left leg with he is still fairly debilitated overall by right leg claudication symptoms. 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   He states his left leg feels a little better.  He has more trouble with pain in the right LE with activity more than 10 min and he has to rest.  He doesn't have any wounds on either LE or history of wounds.    Unfortunately he continues to smoke. I counseled him against this today. I also discussed with him that the durability of any revascularization would be limited if he continues to smoke.  No Known Allergies    Current Outpatient Prescriptions   Medication  Sig  Dispense  Refill   .  aspirin 81 MG tablet  Take 81 mg by mouth daily.       .  carvedilol (COREG) 12.5 MG tablet  Take 12.5 mg by mouth 2 (two) times daily with a meal.       .  carvedilol (COREG) 25 MG tablet  TAKE 1 TABLET TWICE A DAY WITH MEALS (DOSE INCREASE 11/11/2013)  180 tablet  1   .  clopidogrel (PLAVIX) 75 MG tablet  Take 1 tablet (75 mg total) by mouth daily.  42 tablet  0   .  ezetimibe (ZETIA) 10 MG tablet  Take 1 tablet (10 mg total) by mouth daily.  30 tablet  11   .  furosemide (LASIX) 20 MG tablet  TAKE 3 TABLETS DAILY  270 tablet  1   .  furosemide (LASIX) 40 MG tablet  Take 40 mg by mouth 2 (two) times daily.       .  isosorbide mononitrate (IMDUR) 30 MG 24 hr tablet  Take 30 mg by mouth daily.       Marland Kitchen  levothyroxine (SYNTHROID, LEVOTHROID) 50 MCG tablet  Take 50 mcg by mouth daily before breakfast.       .  potassium chloride (K-DUR,KLOR-CON) 10 MEQ tablet  Take 1 tablet (10 mEq total) by mouth daily.  30 tablet  11      No current  facility-administered medications for this visit.      ROS:  See HPI  Physical Exam:    Filed Vitals:   09/28/15 1559  BP: 171/70  Pulse: 62  Height: '5\' 11"'$  (1.803 m)  Weight: 190 lb 6.4 oz (86.365 kg)  SpO2: 95%   Incision:  Well healed   Extremities:  Palpable femoral pulses bilaterally.  Feet inspected clean and dry without any open wounds.  Warm to touch.  Chest: Clear to auscultation bilaterally Cardiac: Regular rate and rhythm  ABI's bilateral LE monophasic flow Right 0.37 last ABI 0.29 pre-op Left 0.46 last ABI 0.44 pre-op   Assessment/Plan:  This is an 80 y.o. male who is s/p prior right common iliac stent recent left external iliac stent with both widely patent. He has bilateral superficial femoral artery occlusions with three-vessel runoff. He is limited overall by his claudication.    At this point he wishes a right femoral to popliteal bypass. This  will be a right femoral to below-knee popliteal bypass. We will  obtain cardiac risk stratification prior to his procedure. Again he was counseled to quit smoking. He will also get bilateral lower extremity vein mapping prior to his procedure. Tentatively right femoral to below-knee popliteal bypass on 10/23/2015. Plan will be to stop his Plavix 5-7 days prior to the procedure.  Ruta Hinds, MD [facility] Office: [phone] Pager: [phone]

## 2015-10-23 NOTE — Transfer of Care (Signed)
Immediate Anesthesia Transfer of Care Note  Patient: Gary Odonnell  Procedure(s) Performed: Procedure(s): BYPASS GRAFT FEMORAL TO BELOW KNEE POPLITEAL ARTERY USING RIGHT NON-REVERSED GREATER SAPPHENOUS VEIN (Right) VEIN HARVEST - RIGHT GREATER SAPPHENOUS (Right) INTRA OPERATIVE ARTERIOGRAM - RIGHT LOWER LEG (Right)  Patient Location: PACU  Anesthesia Type:General  Level of Consciousness: awake and alert   Airway & Oxygen Therapy: Patient Spontanous Breathing and Patient connected to face mask oxygen  Post-op Assessment: Report given to RN and Post -op Vital signs reviewed and stable  Post vital signs: Reviewed and stable  Last Vitals:  Filed Vitals:   10/23/15 0615 10/23/15 1245  BP: 185/62   Pulse: 54   Temp: 36.5 C 36.9 C  Resp: 16     Complications: No apparent anesthesia complications

## 2015-10-23 NOTE — Anesthesia Preprocedure Evaluation (Addendum)
Anesthesia Evaluation  Patient identified by MRN, date of birth, ID band Patient awake    Reviewed: Allergy & Precautions, NPO status , Patient's Chart, lab work & pertinent test results  History of Anesthesia Complications Negative for: history of anesthetic complications  Airway Mallampati: II  TM Distance: >3 FB Neck ROM: Full    Dental  (+) Edentulous Upper, Poor Dentition, Missing, Dental Advisory Given, Chipped   Pulmonary COPD, Current Smoker,  Lung nodule/cancer LLL   breath sounds clear to auscultation       Cardiovascular hypertension, Pt. on medications and Pt. on home beta blockers + CAD, + CABG and + Peripheral Vascular Disease   Rhythm:Regular Rate:Normal  6/15 ECHO: EF 40-45%, valves OK   Neuro/Psych CVA, No Residual Symptoms    GI/Hepatic negative GI ROS, Neg liver ROS,   Endo/Other  Hypothyroidism   Renal/GU Renal InsufficiencyRenal disease (creat 1.25)     Musculoskeletal   Abdominal   Peds  Hematology negative hematology ROS (+)   Anesthesia Other Findings   Reproductive/Obstetrics                          Anesthesia Physical Anesthesia Plan  ASA: III  Anesthesia Plan: General   Post-op Pain Management:    Induction: Intravenous  Airway Management Planned: Oral ETT  Additional Equipment:   Intra-op Plan:   Post-operative Plan: Extubation in OR  Informed Consent: I have reviewed the patients History and Physical, chart, labs and discussed the procedure including the risks, benefits and alternatives for the proposed anesthesia with the patient or authorized representative who has indicated his/her understanding and acceptance.   Dental advisory given  Plan Discussed with: Surgeon and CRNA  Anesthesia Plan Comments: (Plan routine monitors, GETA)        Anesthesia Quick Evaluation

## 2015-10-24 ENCOUNTER — Encounter (HOSPITAL_COMMUNITY): Payer: Self-pay | Admitting: Vascular Surgery

## 2015-10-24 LAB — BASIC METABOLIC PANEL
ANION GAP: 4 — AB (ref 5–15)
BUN: 13 mg/dL (ref 6–20)
CHLORIDE: 105 mmol/L (ref 101–111)
CO2: 30 mmol/L (ref 22–32)
Calcium: 9.1 mg/dL (ref 8.9–10.3)
Creatinine, Ser: 1.26 mg/dL — ABNORMAL HIGH (ref 0.61–1.24)
GFR calc non Af Amer: 52 mL/min — ABNORMAL LOW (ref >60)
Glucose, Bld: 125 mg/dL — ABNORMAL HIGH (ref 65–99)
Potassium: 4.5 mmol/L (ref 3.5–5.1)
Sodium: 139 mmol/L (ref 135–145)

## 2015-10-24 LAB — CBC
HEMATOCRIT: 35.2 % — AB (ref 39.0–52.0)
HEMOGLOBIN: 11.7 g/dL — AB (ref 13.0–17.0)
MCH: 31.6 pg (ref 26.0–34.0)
MCHC: 33.2 g/dL (ref 30.0–36.0)
MCV: 95.1 fL (ref 78.0–100.0)
Platelets: 118 10*3/uL — ABNORMAL LOW (ref 150–400)
RBC: 3.7 MIL/uL — ABNORMAL LOW (ref 4.22–5.81)
RDW: 13.6 % (ref 11.5–15.5)
WBC: 6.5 10*3/uL (ref 4.0–10.5)

## 2015-10-24 NOTE — Progress Notes (Addendum)
[facility]  Subjective  - Sitting up and comfortable   Objective 152/53 70 98.7 F (37.1 C) (Oral) 12 90%  Intake/Output Summary (Last 24 hours) at 10/24/15 0745 Last data filed at 10/24/15 0600  Gross per 24 hour  Intake 3944.17 ml  Output   1275 ml  Net 2669.17 ml    Doppler right DP/PT biphasic Incision intact. SS drainage distal LE incision dry 4 x 4 placed and on groin as well Heart RRR Lungs non labored breathing  Assessment/Planning: POD # 39fmoral to below knee popliteal bypass with right non-reversed greater saphenous vein.   Transfer to 2W PT for ambulation Saline lock IV after IV antibiotics completed  CLaurence SlateMChildren'S Hospital Of [facility]11/29/2016 7:45 AM -- Agree with above.  Brisk right foot doppler PT lesser DP.  Transfer to 2 w. Home next 1-2 days  CRuta Hinds MD [facility] [place] [phone]: [phone] Laboratory Lab Results:  Recent Labs  10/23/15 1705 10/24/15 0037  WBC 8.3 6.5  HGB 12.9* 11.7*  HCT 38.9* 35.2*  PLT 125* 118*   BMET  Recent Labs  10/23/15 1705 10/24/15 0037  NA  --  139  K  --  4.5  CL  --  105  CO2  --  30  GLUCOSE  --  125*  BUN  --  13  CREATININE 1.29* 1.26*  CALCIUM  --  9.1    COAG Lab Results  Component Value Date   INR 1.07 10/11/2015   INR 1.19 12/27/2014   INR 1.31 09/29/2013   No results found for: PTT

## 2015-10-24 NOTE — Progress Notes (Signed)
Report called to [facility], receiving RN on  2 west. VSS. Transferred to 2W14 via wheelchair with personal belongings.   [facility]

## 2015-10-24 NOTE — Progress Notes (Signed)
UR COMPLETED  

## 2015-10-24 NOTE — Evaluation (Signed)
Occupational Therapy Evaluation Patient Details Name: Gary Odonnell MRN: [id number] DOB: 1932-07-02 Today's Date: 10/24/2015    History of Present Illness 81 y.o. male who is s/p femoral to below knee popliteal bypass with right non-reversed greater saphenous vein.    Clinical Impression   Pt s/p above. Pt independent with ADLs, PTA. Feel pt will benefit from acute OT to increase independence prior to d/c.     Follow Up Recommendations  Home health OT;Supervision - Intermittent    Equipment Recommendations  None recommended by OT    Recommendations for Other Services       Precautions / Restrictions Precautions Precautions: Fall Restrictions Weight Bearing Restrictions: No      Mobility Bed Mobility Overal bed mobility: Needs Assistance Bed Mobility: Supine to Sit;Sit to Supine     Supine to sit: Supervision-cues to scoot to get feet on floor Sit to supine: Modified independent (Device/Increase time)   General bed mobility comments: cues for scooting HOB  Transfers Overall transfer level: Needs assistance Equipment used: Rolling walker (2 wheeled) Transfers: Sit to/from Stand Sit to Stand: Min assist              Balance    Min guard for ambulation with RW. Pt with decreased sitting balance sitting EOB.                                        ADL Overall ADL's : Needs assistance/impaired                     Lower Body Dressing: Moderate assistance;Sit to/from stand   Toilet Transfer: Minimal assistance;Ambulation;RW (Min assist-transfers and Min guard-ambulation)           Functional mobility during ADLs: Min guard;Rolling walker General ADL Comments: Educated on LB dressing technique and explained sockaid is available to assist with donning sock.     Vision     Perception     Praxis      Pertinent Vitals/Pain Pain Assessment: 0-10 Pain Score:  (1-2) Pain Location: RLE with ambulation Pain Descriptors /  Indicators: Sore Pain Intervention(s): Monitored during session     Hand Dominance     Extremity/Trunk Assessment Upper Extremity Assessment Upper Extremity Assessment: LUE deficits/detail LUE Sensation: decreased light touch   Lower Extremity Assessment Lower Extremity Assessment: Defer to PT evaluation       Communication Communication Communication: No difficulties   Cognition Arousal/Alertness: Awake/alert Behavior During Therapy: WFL for tasks assessed/performed Overall Cognitive Status: Within Functional Limits for tasks assessed                     General Comments       Exercises       Shoulder Instructions      Home Living Family/patient expects to be discharged to:: Private residence Living Arrangements: Spouse/significant other Available Help at Discharge: Family;Available 24 hours/day Type of Home: House             Bathroom Shower/Tub: Walk-in Psychologist, prison and probation services: Handicapped height (sink near)     Home Equipment: Shower seat - built in (pt reports he has walker but unsure how many wheels)   Additional Comments: refer to PT eval for additional home information      Prior Functioning/Environment Level of Independence: Independent with assistive device(s)  OT Diagnosis: Acute pain;Generalized weakness   OT Problem List: Decreased strength;Pain;Decreased knowledge of precautions;Decreased knowledge of use of DME or AE;Decreased range of motion;Impaired balance (sitting and/or standing)   OT Treatment/Interventions: Self-care/ADL training;DME and/or AE instruction;Therapeutic activities;Patient/family education;Balance training    OT Goals(Current goals can be found in the care plan section) Acute Rehab OT Goals Patient Stated Goal: go home tomorrow OT Goal Formulation: With patient Time For Goal Achievement: 10/31/15 Potential to Achieve Goals: Good ADL Goals Pt Will Perform Lower Body Dressing: with  supervision;with set-up;sit to/from stand;with adaptive equipment Pt Will Transfer to Toilet: ambulating;with supervision;grab bars (elevated toilet) Pt Will Perform Toileting - Clothing Manipulation and hygiene: with supervision;sit to/from stand Pt Will Perform Tub/Shower Transfer: Shower transfer;with supervision;ambulating;rolling walker;with set-up;shower seat  OT Frequency: Min 2X/week   Barriers to D/C:            Co-evaluation              End of Session Equipment Utilized During Treatment: Gait belt;Rolling walker  Activity Tolerance: Patient tolerated treatment well Patient left: in bed;with call bell/phone within reach;with bed alarm set   Time: 6314-9702 OT Time Calculation (min): 12 min Charges:  OT General Charges $OT Visit: 1 Procedure OT Evaluation $Initial OT Evaluation Tier I: 1 Procedure G-CodesBenito Mccreedy OTR/L [id number] 10/24/2015, 4:11 PM

## 2015-10-24 NOTE — Progress Notes (Signed)
Admission note:  Arrival Method: Pt arrived from 3s on a stretcher Mental Orientation: pt was alert and oriented x4 Telemetry: pt placed on cardiac monitor CCMD notified Assessment: see flow sheet  Skin: assesment completed by this RN and Lattie Haw. Skin dry and warm no pressure ulcer observed IV: pt has a left forearm IV saline locked Pain: no complaints of pain Tubes: N/A Safety Measures: Pt oriented to the room, safety measures discussed with pt, verbalizes understanding. Call light and phone within reach will continue to monitor Fall Prevention Safety Plan: non skid socks, low bed  Admission Screening:  6700 Orientation: Patient has been oriented to the unit, staff and to the room. Resting quietly in bed call lighjt within reach .Will continue to monitor. Oren Beckmann, RN

## 2015-10-24 NOTE — Evaluation (Signed)
Physical Therapy Evaluation Patient Details Name: Gary Odonnell MRN: [id number] DOB: [birth year] Today's Date: 10/24/2015   History of Present Illness  81 y.o. male who is s/p femoral to below knee popliteal bypass with right non-reversed greater saphenous vein.   Clinical Impression  Pt admitted with above diagnosis. Pt currently with functional limitations due to the deficits listed below (see PT Problem List). Mr. Ridings will have 24/7 assist from his wife at d/c but will need to complete stair training before doing so.  He currently requires min A for safe sit<>stand and ambulation and needs reminders to keep RW a safe distance from him. Pt will benefit from skilled PT to increase their independence and safety with mobility to allow discharge to the venue listed below.      Follow Up Recommendations Home health PT;Supervision for mobility/OOB    Equipment Recommendations  None recommended by PT    Recommendations for Other Services       Precautions / Restrictions Precautions Precautions: Fall Restrictions Weight Bearing Restrictions: No      Mobility  Bed Mobility Overal bed mobility: Needs Assistance Bed Mobility: Supine to Sit;Sit to Supine     Supine to sit: Supervision Sit to supine: Supervision   General bed mobility comments: Supervision for pt's safety.  Increased time  Transfers Overall transfer level: Needs assistance Equipment used: Rolling walker (2 wheeled) Transfers: Sit to/from Stand Sit to Stand: Min assist         General transfer comment: Min assist as pt demonstrates instability.  Cues for upright posture.  Cues for proper management of RW when sitting.  Ambulation/Gait Ambulation/Gait assistance: Min assist Ambulation Distance (Feet): 80 Feet Assistive device: Rolling walker (2 wheeled) Gait Pattern/deviations: Step-through pattern;Antalgic;Trunk flexed;Decreased stride length   Gait velocity interpretation: Below normal speed for  age/gender General Gait Details: Multiple reminders to take RW w/ him to sink, bathroom, and when sitting down.  Cues for upright posture.  Pt demonstrates mild instability w/ use of RW.  Stairs            Wheelchair Mobility    Modified Rankin (Stroke Patients Only)       Balance Overall balance assessment: Needs assistance Sitting-balance support: Bilateral upper extremity supported;Feet supported Sitting balance-Leahy Scale: Good     Standing balance support: Bilateral upper extremity supported;During functional activity Standing balance-Leahy Scale: Poor Standing balance comment: Relies on RW for support                             Pertinent Vitals/Pain Pain Assessment: 0-10 Pain Score: 2  Pain Location: Rt groin Pain Descriptors / Indicators: Sore Pain Intervention(s): Limited activity within patient's tolerance;Monitored during session;Repositioned    Home Living Family/patient expects to be discharged to:: Private residence Living Arrangements: Spouse/significant other Available Help at Discharge: Family;Available 24 hours/day Type of Home: House Home Access: Stairs to enter Entrance Stairs-Rails: Can reach both;Left;Right Entrance Stairs-Number of Steps: 5 Home Layout: One level Home Equipment: Walker - 2 wheels;Cane - single point Additional Comments: refer to PT eval for additional home information    Prior Function Level of Independence: Independent               Hand Dominance        Extremity/Trunk Assessment   Upper Extremity Assessment: Defer to OT evaluation           Lower Extremity Assessment: RLE deficits/detail RLE Deficits / Details: limited ROM and  strength s/p surgery    Cervical / Trunk Assessment: Kyphotic  Communication   Communication: No difficulties  Cognition Arousal/Alertness: Awake/alert Behavior During Therapy: WFL for tasks assessed/performed Overall Cognitive Status: Within Functional Limits  for tasks assessed                      General Comments      Exercises General Exercises - Lower Extremity Ankle Circles/Pumps: AROM;Both;10 reps;Seated Long Arc Quad: AROM;Both;10 reps;Seated      Assessment/Plan    PT Assessment Patient needs continued PT services  PT Diagnosis Difficulty walking;Acute pain   PT Problem List Decreased strength;Decreased range of motion;Decreased activity tolerance;Decreased balance;Decreased mobility;Decreased knowledge of use of DME;Decreased safety awareness;Decreased knowledge of precautions;Pain  PT Treatment Interventions DME instruction;Gait training;Stair training;Functional mobility training;Therapeutic activities;Therapeutic exercise;Balance training;Neuromuscular re-education;Patient/family education   PT Goals (Current goals can be found in the Care Plan section) Acute Rehab PT Goals Patient Stated Goal: go home tomorrow PT Goal Formulation: With patient Time For Goal Achievement: 11/07/15 Potential to Achieve Goals: Good    Frequency Min 3X/week   Barriers to discharge Inaccessible home environment 5 steps to enter home    Co-evaluation               End of Session Equipment Utilized During Treatment: Gait belt Activity Tolerance: Patient tolerated treatment well;Patient limited by fatigue Patient left: in bed;with call bell/phone within reach;with bed alarm set Nurse Communication: Mobility status;Precautions         Time: 1410-3013 PT Time Calculation (min) (ACUTE ONLY): 23 min   Charges:   PT Evaluation $Initial PT Evaluation Tier I: 1 Procedure PT Treatments $Gait Training: 8-22 mins   PT G CodesJoslyn Hy PT, DPT [phone] Pager: [phone] 10/24/2015, 4:19 PM

## 2015-10-25 ENCOUNTER — Ambulatory Visit (HOSPITAL_COMMUNITY): Payer: Medicare Other

## 2015-10-25 MED ORDER — OXYCODONE-ACETAMINOPHEN 5-325 MG PO TABS: 1.0000 | ORAL_TABLET | ORAL | Status: DC | PRN

## 2015-10-25 NOTE — Progress Notes (Signed)
Occupational Therapy Treatment Patient Details Name: Gary Odonnell MRN: [id number] DOB: Aug 04, 1932 Today's Date: 10/25/2015    History of present illness 80 y.o. male who is s/p femoral to below knee popliteal bypass with right non-reversed greater saphenous vein.    OT comments  Patient progressing towards OT goals, continue plan of care for now. Pt overall min guard to min assist with ADLs and functional mobility. Pt does best with RW for dynamic standing balance, however he requires multimodal cueing for safety with RW. Recommending 24/7 supervision for patient's overall safety, pt reports he lives with his wife who can provide this post acute d/c.    Follow Up Recommendations  Home health OT;Supervision/Assistance - 24 hour    Equipment Recommendations  None recommended by OT    Recommendations for Other Services  None at this time  Precautions / Restrictions Precautions Precautions: Fall Restrictions Weight Bearing Restrictions: No    Mobility Bed Mobility General bed mobility comments: Pt found seated in recliner upon OT entering/exiting room  Transfers Overall transfer level: Needs assistance Equipment used: Rolling walker (2 wheeled);None Transfers: Sit to/from Stand Sit to Stand: Min guard General transfer comment: Min guard as pt demonstrates instability with and without use of RW, worse without RW.  Cues for upright posture.  Cues for proper management of RW during all mobility.     Balance Overall balance assessment: Needs assistance Sitting-balance support: No upper extremity supported;Feet supported Sitting balance-Leahy Scale: Good     Standing balance support: Bilateral upper extremity supported;During functional activity;No upper extremity supported Standing balance-Leahy Scale: Poor   ADL Overall ADL's : Needs assistance/impaired Eating/Feeding: Set up;Sitting   Grooming: Min guard;Standing;Wash/dry hands;Oral care Grooming Details (indicate cue type  and reason): at sink Upper Body Bathing: Min guard;Sitting   Lower Body Bathing: Minimal assistance;Sit to/from stand   Upper Body Dressing : Min guard;Sitting   Lower Body Dressing: Minimal assistance;Sit to/from stand Lower Body Dressing Details (indicate cue type and reason): cues and education on most effective way to perform LB dressing Toilet Transfer: Min guard;RW;BSC;Grab bars;Ambulation General ADL Comments: Pt found seated in recliner. Pt stood without AD, pt reporting he did not need the RW. With minimal hand held assistance patient ambulated to BR. Pt with urine leakage all the way to BR, but did not mention this to therapist. Pt performed toilet transfer with min guard assist. Pt stood from [facility] and therapist insisted patient use RW, pt willing. Pt then ambulated to sink to brush teeth, then out in hallway. Pt with minimal bleeding/discharge from incision site, notified RN of this. Therapist assisted patient with changing of wet gown and socks. Elevated BLes at end of session.    Cognition   Behavior During Therapy: WFL for tasks assessed/performed Overall Cognitive Status: Within Functional Limits for tasks assessed                 Pertinent Vitals/ Pain       Pain Assessment: Faces Faces Pain Scale: Hurts a little bit Pain Location: RLE Pain Descriptors / Indicators: Sore Pain Intervention(s): Monitored during session;Repositioned   Frequency Min 2X/week     Progress Toward Goals  OT Goals(current goals can now befound in the care plan section)  Progress towards OT goals: Progressing toward goals     Plan Discharge plan remains appropriate    End of Session Equipment Utilized During Treatment: Rolling walker   Activity Tolerance Patient tolerated treatment well   Patient Left in chair;with call bell/phone within reach  Nurse Communication Mobility status;Other (comment) (pt with urine incontinence and minimal bleeding/discharge on right leg)     Time:  9702-6378 OT Time Calculation (min): 20 min  Charges: OT General Charges $OT Visit: 1 Procedure OT Treatments $Self Care/Home Management : 8-22 mins  Wayman Hoard , MS, OTR/L, CLT Pager: [phone]  10/25/2015, 9:43 AM

## 2015-10-25 NOTE — Progress Notes (Addendum)
[facility] [place]  Subjective  - Doing well he has walked, voided and ambulating   Objective 152/58 68 98.3 F (36.8 C) (Oral) 18 97%  Intake/Output Summary (Last 24 hours) at 10/25/15 0738 Last data filed at 10/25/15 0432  Gross per 24 hour  Intake    540 ml  Output    550 ml  Net    -10 ml    Doppler DP/PT signals min. Edema Incisions healing well, groin soft without hematoma Lungs non labored breathing Heart sinus rhythm   Assessment/Planning: POD # 2 femoral to below knee popliteal bypass with right non-reversed greater saphenous vein.   He wants to go home today He has ambulated, voided, and tolerating PO's well Disposition stable for discahrge  Laurence Slate [facility] 10/25/2015 7:38 AM -- Agree with above Will d/c home follow up in 2 weeks  Ruta Hinds, MD [facility] [place]: [phone] Pager: [phone]  Laboratory Lab Results:  Recent Labs  10/23/15 1705 10/24/15 0037  WBC 8.3 6.5  HGB 12.9* 11.7*  HCT 38.9* 35.2*  PLT 125* 118*   BMET  Recent Labs  10/23/15 1705 10/24/15 0037  NA  --  139  K  --  4.5  CL  --  105  CO2  --  30  GLUCOSE  --  125*  BUN  --  13  CREATININE 1.29* 1.26*  CALCIUM  --  9.1    COAG Lab Results  Component Value Date   INR 1.07 10/11/2015   INR 1.19 12/27/2014   INR 1.31 09/29/2013   No results found for: PTT

## 2015-10-25 NOTE — Progress Notes (Signed)
Pt in stable condition, IV taken out Cardiac monitor DC , CCMD notified belongings at bedside, priscription given to pt, discgharge instructions and education were discussed with pt and pt verbalized understanding of them. Oren Beckmann, RN

## 2015-10-25 NOTE — Care Management Note (Signed)
Case Management Note  Patient Details  Name: Gary Odonnell MRN: [id number] Date of Birth: [birth year]  Subjective/Objective:   Pt admitted with PAD, pt is s/p fempop bypass  Action/Plan:  Pt is independent from home with wife.  Pt states he already has a walker and cane but can still ambulate independently.  Pt refused HH as recommended, stated "I don't think I need it".  CM confirmed with bedside nurse that pt is ambulatory in room.  CM informed pt that once he is discharged home if he reconsiders [facility], he can ask him PCP.   Expected Discharge Date:                  Expected Discharge Plan:  Home/Self Care  In-House Referral:     Discharge planning Services  CM Consult  Post Acute Care Choice:    Choice offered to:     DME Arranged:    DME Agency:     HH Arranged:    [place] Agency:     Status of Service:  Completed, signed off  Medicare Important Message Given:    Date Medicare IM Given:    Medicare IM give by:    Date Additional Medicare IM Given:    Additional Medicare Important Message give by:     If discussed at [place] of Stay Meetings, dates discussed:    Additional Comments:  Maryclare Labrador, RN 10/25/2015, 9:29 AM

## 2015-10-25 NOTE — Progress Notes (Signed)
Physical Therapy Treatment Patient Details Name: Gary Odonnell MRN: [id number] DOB: Sep 09, 1932 Today's Date: 10/25/2015    History of Present Illness 81 y.o. male who is s/p femoral to below knee popliteal bypass with right non-reversed greater saphenous vein.     PT Comments    Has progressed well.  Education completed,  DME needs met.  Follow Up Recommendations  No PT follow up     Equipment Recommendations       Recommendations for Other Services       Precautions / Restrictions Precautions Precautions: Fall Restrictions Weight Bearing Restrictions: No    Mobility  Bed Mobility               General bed mobility comments: In reliner on arrival  Transfers Overall transfer level: Needs assistance Equipment used: Rolling walker (2 wheeled);None Transfers: Sit to/from Stand Sit to Stand: Min guard         General transfer comment: steady holding to AD or stationary objects  Ambulation/Gait Ambulation/Gait assistance: Min guard Ambulation Distance (Feet): 250 Feet Assistive device: Rolling walker (2 wheeled) Gait Pattern/deviations: Step-through pattern;Decreased step length - left;Decreased stance time - right     General Gait Details: postural cues and cues to stay more close to the [employer]            Wheelchair Mobility    Modified Rankin (Stroke Patients Only)       Balance Overall balance assessment: Needs assistance Sitting-balance support: No upper extremity supported;Feet supported Sitting balance-Leahy Scale: Good     Standing balance support: Single extremity supported;Bilateral upper extremity supported Standing balance-Leahy Scale: Fair Standing balance comment: can stand unassisted, but reliant on RW for longer distances                    Cognition Arousal/Alertness: Awake/alert Behavior During Therapy: WFL for tasks assessed/performed Overall Cognitive Status: Within Functional Limits for tasks  assessed                      Exercises General Exercises - Lower Extremity Ankle Circles/Pumps: AROM;Both;10 reps;Seated Long Arc Quad: AROM;Both;10 reps;Seated    General Comments        Pertinent Vitals/Pain Pain Assessment: Faces Pain Score: 2  Faces Pain Scale: Hurts a little bit Pain Location: R LE Pain Descriptors / Indicators: Sore Pain Intervention(s): Monitored during session    Home Living                      Prior Function            PT Goals (current goals can now be found in the care plan section) Acute Rehab PT Goals Patient Stated Goal: go home tomorrow PT Goal Formulation: With patient Time For Goal Achievement: 11/07/15 Potential to Achieve Goals: Good Progress towards PT goals: Progressing toward goals    Frequency  Min 3X/week    PT Plan Current plan remains appropriate    Co-evaluation             End of Session   Activity Tolerance: Patient tolerated treatment well;Patient limited by fatigue Patient left: in bed;with call bell/phone within reach;with bed alarm set     Time: 4098-1191 PT Time Calculation (min) (ACUTE ONLY): 17 min  Charges:  $Gait Training: 8-22 mins                    G Codes:  Zohan Shiflet, Tessie Fass 10/25/2015, 10:56 AM  10/25/2015  Donnella Sham, PT [phone]  (pager)

## 2015-10-26 ENCOUNTER — Telehealth: Payer: Self-pay | Admitting: Vascular Surgery

## 2015-10-26 NOTE — Telephone Encounter (Addendum)
-----   Message from Mena Goes, RN sent at 10/25/2015  9:42 AM EST ----- Regarding: schedule   ----- Message -----    From: Ulyses Amor, PA-C    Sent: 10/25/2015   7:47 AM      To: Vvs Charge Pool  S/P right fem-pop by pass f/u with Dr. Oneida Alar in 2 weeks  notified patient's spouse of post op appt. on 11-09-15 2:30 with dr. Oneida Alar

## 2015-10-27 ENCOUNTER — Other Ambulatory Visit: Payer: Self-pay | Admitting: *Deleted

## 2015-10-27 ENCOUNTER — Telehealth: Payer: Self-pay | Admitting: *Deleted

## 2015-10-27 DIAGNOSIS — I739 Peripheral vascular disease, unspecified: Secondary | ICD-10-CM

## 2015-10-27 MED ORDER — CLOPIDOGREL BISULFATE 75 MG PO TABS
75.0000 mg | ORAL_TABLET | Freq: Every day | ORAL | Status: DC
Start: 2015-10-27 — End: 2016-09-27

## 2015-10-27 NOTE — Telephone Encounter (Signed)
-----   Message from Ulyses Amor, [place] sent at 10/27/2015  7:53 AM EST ----- Would someone call and make sure he has restarted his Plavix thx [facility]

## 2015-10-27 NOTE — Discharge Summary (Signed)
[facility] Discharge Summary   Patient ID:  Gary Odonnell MRN: [id number] DOB/AGE: May 02, 1932 81 y.o.  Admit date: 10/23/2015 Discharge date: 10/27/2015 Date of Surgery: 10/23/2015 Surgeon: Surgeon(s): [name], MD  Admission Diagnosis: Peripheral vascular disease with bilateral lower extremity claudication I70.213  Discharge Diagnoses:  Peripheral vascular disease with bilateral lower extremity claudication I70.213  Secondary Diagnoses: Past Medical History  Diagnosis Date  . Coronary artery disease   . Hypertension   . PAD (peripheral artery disease) ([place])   . Hypothyroidism   . Stroke ([place]) 11/2008  . Pneumonia 09/2013  . Hypercholesteremia   . Solitary pulmonary nodule on lung CT 12/13/13   11/15/14  . Cancer ([place])     L lung  . Radiation april 2016    Procedure(s): BYPASS GRAFT FEMORAL TO BELOW KNEE POPLITEAL ARTERY USING RIGHT NON-REVERSED GREATER SAPPHENOUS VEIN VEIN HARVEST - RIGHT GREATER SAPPHENOUS INTRA OPERATIVE ARTERIOGRAM - RIGHT LOWER LEG  Discharged Condition: good  HPI: This is a 81 y.o. male who is s/p Left external iliac stent. He has known bilateral superficial femoral artery occlusions. He has bilateral reconstitution of the below-knee popliteal artery with three-vessel runoff. His claudication symptoms have improved slightly in the left leg with he is still fairly debilitated overall by right leg claudication symptoms. 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  He states his left leg feels a little better. He has more trouble with pain in the right LE with activity more than 10 min and he has to rest. He doesn't have any wounds on either LE or history of wounds. Unfortunately he continues to smoke. I counseled him against this today. I also discussed with him that the durability of any revascularization would be limited if he continues to smoke. This is an 82 y.o. male who  is s/p prior right common iliac stent recent left external iliac stent with both widely patent. He has bilateral superficial femoral artery occlusions with three-vessel runoff. He is limited overall by his claudication. At this point he wishes a right femoral to popliteal bypass. This will be a right femoral to below-knee popliteal bypass. We will obtain cardiac risk stratification prior to his procedure. Again he was counseled to quit smoking. He will also get bilateral lower extremity vein mapping prior to his procedure. Tentatively right femoral to below-knee popliteal bypass on 10/23/2015. Plan will be to stop his Plavix 5-7 days prior to the procedure.  Hospital Course:  [name] is a 81 y.o. male is S/P Right Procedure(s): BYPASS GRAFT FEMORAL TO BELOW KNEE POPLITEAL ARTERY USING RIGHT NON-REVERSED GREATER SAPPHENOUS VEIN VEIN HARVEST - RIGHT GREATER SAPPHENOUS INTRA OPERATIVE ARTERIOGRAM - RIGHT LOWER LEG  POD#1  POD # 46fmoral to below knee popliteal bypass with right non-reversed greater saphenous vein.  Doppler right DP/PT biphasic Incision intact. SS drainage distal LE incision dry 4 x 4 placed and on groin as well Heart RRR Lungs non labored breathing Transfer to 2W PT for ambulation Saline lock IV after IV antibiotics completed  POD#2 femoral to below knee popliteal bypass with right non-reversed greater saphenous vein.   He wants to go home today He has ambulated, voided, and tolerating PO's well Disposition stable for discahrge  Significant Diagnostic Studies: CBC Lab Results  Component Value Date   WBC 6.5 10/24/2015   HGB 11.7* 10/24/2015   HCT 35.2* 10/24/2015   MCV 95.1 10/24/2015   PLT 118*  10/24/2015    BMET    Component Value Date/Time   NA 139 10/24/2015 0037   K 4.5 10/24/2015 0037   CL 105 10/24/2015 0037   CO2 30 10/24/2015 0037   GLUCOSE 125* 10/24/2015 0037   BUN 13 10/24/2015 0037   CREATININE 1.26* 10/24/2015 0037   CALCIUM 9.1  10/24/2015 0037   GFRNONAA 52* 10/24/2015 0037   GFRAA >60 10/24/2015 0037   COAG Lab Results  Component Value Date   INR 1.07 10/11/2015   INR 1.19 12/27/2014   INR 1.31 09/29/2013     Disposition:  Discharge to :Home Discharge Instructions    Call MD for:  redness, tenderness, or signs of infection (pain, swelling, bleeding, redness, odor or green/yellow discharge around incision site)    Complete by:  As directed      Call MD for:  severe or increased pain, loss or decreased feeling  in affected limb(s)    Complete by:  As directed      Call MD for:  temperature >100.5    Complete by:  As directed      Discharge instructions    Complete by:  As directed   You may shower with soap and water daily.  Make sure you dry the right groin incision area well.     Driving Restrictions    Complete by:  As directed   No driving for 1 week     Lifting restrictions    Complete by:  As directed   No heavy lifting for 6 weeks     Resume previous diet    Complete by:  As directed             Medication List    TAKE these medications        aspirin 81 MG tablet  Take 81 mg by mouth daily.     carvedilol 12.5 MG tablet  Commonly known as:  COREG  Take 12.5 mg by mouth 2 (two) times daily with a meal.     ezetimibe 10 MG tablet  Commonly known as:  ZETIA  Take 1 tablet (10 mg total) by mouth daily.     furosemide 20 MG tablet  Commonly known as:  LASIX  Take 1 tablet (20 mg total) by mouth daily.     lisinopril 2.5 MG tablet  Commonly known as:  PRINIVIL,ZESTRIL  Take 1 tablet (2.5 mg total) by mouth daily.     oxyCODONE-acetaminophen 5-325 MG tablet  Commonly known as:  PERCOCET/ROXICET  Take 1-2 tablets by mouth every 4 (four) hours as needed for moderate pain.     potassium chloride 10 MEQ tablet  Commonly known as:  K-DUR,KLOR-CON  Take 1 tablet (10 mEq total) by mouth daily.       Verbal and written Discharge instructions given to the patient. Wound care per  Discharge AVS     Follow-up Information    Follow up with Ruta Hinds, MD In 2 weeks.   Specialties:  Vascular Surgery, Cardiology   Why:  Office will call you to arrange your appt (sent)   Contact information:   [address] [place] [address] [phone]       Signed: Laurence Slate [facility] 10/27/2015, 7:47 AM  - For VQI Registry use --- Instructions: Press F2 to tab through selections.  Delete question if not applicable.   Post-op:  Wound infection: No  Graft infection: No  Transfusion: No  If yes, 0 units given New Arrhythmia: No  Ipsilateral amputation: [x ] no, '[ ]'$  Minor, '[ ]'$  BKA, '[ ]'$  AKA Discharge patency: [x ] Primary, '[ ]'$  Primary assisted, '[ ]'$  Secondary, '[ ]'$  Occluded Patency judged by: [x ] Dopper only, '[ ]'$  Palpable graft pulse, '[ ]'$  Palpable distal pulse, '[ ]'$  ABI inc. > 0.15, '[ ]'$  Duplex  D/C Ambulatory Status: Ambulatory  Complications: MI: [ x] No, '[ ]'$  Troponin only, '[ ]'$  EKG or Clinical CHF: No Resp failure: [x ] none, '[ ]'$  Pneumonia, '[ ]'$  Ventilator Chg in renal function: [ x] none, '[ ]'$  Inc. Cr > 0.5, '[ ]'$  Temp. Dialysis, '[ ]'$  Permanent dialysis Stroke: [x ] None, '[ ]'$  Minor, '[ ]'$  Major Return to OR: No  Reason for return to OR: '[ ]'$  Bleeding, '[ ]'$  Infection, '[ ]'$  Thrombosis, '[ ]'$  Revision  Discharge medications: Statin use:  No  for medical reason   ASA use:  Yes Plavix use:  Yes Beta blocker use: Yes Coumadin use: No  for medical reason

## 2015-10-27 NOTE — Telephone Encounter (Signed)
Called patient's wife and she says that he did start back on Plavix. He is having no problems.

## 2015-10-31 ENCOUNTER — Telehealth: Payer: Self-pay

## 2015-10-31 NOTE — Telephone Encounter (Signed)
Phone call from pt.  Reported intermittent thin, bloody drainage from upper portion of lower leg incision.  Denied redness in surrounding tissue.  Reported "it's a little pink."  Stated the incisions are intact.  Denied fever/chills.  Reported the swelling of the right foot has resolved.  Stated there is still a little swelling in the right lower leg and knee area.  Encouraged to elevate the legs above level of heart minimum of 3-4 times / day, and to maintain walking short distances throughout day, as tolerated.  Encouraged to call if any signs of infection, or any worsening of symptoms.  Verb. Understanding.

## 2015-11-03 ENCOUNTER — Encounter: Payer: Self-pay | Admitting: Family

## 2015-11-09 ENCOUNTER — Encounter: Payer: Self-pay | Admitting: Vascular Surgery

## 2015-11-09 ENCOUNTER — Ambulatory Visit (INDEPENDENT_AMBULATORY_CARE_PROVIDER_SITE_OTHER): Payer: Medicare Other | Admitting: Vascular Surgery

## 2015-11-09 VITALS — BP 149/68 | HR 55 | Temp 98.4°F | Ht 71.5 in

## 2015-11-09 DIAGNOSIS — I739 Peripheral vascular disease, unspecified: Secondary | ICD-10-CM

## 2015-11-09 MED ORDER — CEPHALEXIN 500 MG PO CAPS: 500.0000 mg | ORAL_CAPSULE | Freq: Three times a day (TID) | ORAL | Status: DC

## 2015-11-09 NOTE — Progress Notes (Signed)
Patient is an 81 year old male who returns for postoperative follow-up today. He recently underwent right femoral to below-knee popliteal bypass with vein. He has had some drainage from the below-knee incision. He denies any fever or chills.  Physical exam:  Filed Vitals:   11/09/15 1451 11/09/15 1453  BP: 152/66 149/68  Pulse: 55   Temp: 98.4 F (36.9 C)   TempSrc: Oral   Height: 5' 11.5" (1.816 m)   SpO2: 96%     Right lower extremity healing incision some erythema in the mid thigh saphenectomy incision as well as some erythema and slight separation of the below-knee popliteal incision the right groin incision is well-healed Minimal swelling at this point biphasic PT monophasic DP Doppler  Assessment: Patent right femoral below-knee popliteal bypass with some erythema around several of the incisions on the right leg which appears to be primarily suture irritation. He does have some slight separation of the below-knee incision but overall he is healing.  Plan: Patient will follow-up with me in 2 weeks' time. I placed him on Keflex today 3 times daily for 10 days.  Ruta Hinds, MD [facility] [place] Office: [phone] Pager: [phone]

## 2015-11-13 ENCOUNTER — Other Ambulatory Visit: Payer: Self-pay | Admitting: Cardiology

## 2015-11-23 ENCOUNTER — Encounter: Payer: Self-pay | Admitting: Vascular Surgery

## 2015-11-30 ENCOUNTER — Ambulatory Visit (INDEPENDENT_AMBULATORY_CARE_PROVIDER_SITE_OTHER): Payer: Medicare Other | Admitting: Vascular Surgery

## 2015-11-30 ENCOUNTER — Encounter: Payer: Self-pay | Admitting: Vascular Surgery

## 2015-11-30 VITALS — BP 139/68 | HR 46 | Temp 97.9°F | Resp 14 | Ht 71.5 in | Wt 193.0 lb

## 2015-11-30 DIAGNOSIS — I739 Peripheral vascular disease, unspecified: Secondary | ICD-10-CM

## 2015-11-30 NOTE — Addendum Note (Signed)
Addended by: Dorthula Rue L on: 11/30/2015 01:20 PM   Modules accepted: Orders

## 2015-11-30 NOTE — Progress Notes (Signed)
Patient is an 82 year old male who returns for postoperative follow-up today. He recently underwent right femoral to below-knee popliteal bypass with vein 10/23/2015. He has had some drainage from the below-knee incision. He denies any fever or chills. He states his claudication symptoms in the right leg have completely resolved.  Physical exam:  Filed Vitals:   11/30/15 1039  BP: 139/68  Pulse: 46  Temp: 97.9 F (36.6 C)  TempSrc: Oral  Resp: 14  Height: 5' 11.5" (1.816 m)  Weight: 193 lb (87.544 kg)  SpO2: 95%    Right lower extremity healing incision erythema in the mid thigh saphenectomy incision  Is completely resolved as well as some erythema that was previously at the below-knee incision.   There is still some slight separation of the below-knee popliteal incision  wound is 3 cm x 2 cm less than 1 mm depth some old suture material was debrided from this today, the right groin incision is well-healed Minimal swelling at this point biphasic PT monophasic DP Doppler  Assessment: Patent right femoral below-knee popliteal bypass with some erythema around several of the incisions on the right leg is now resolved appears to be primarily suture irritation. He does have some slight separation of the below-knee incision but overall he is healing.  Plan: Patient will follow-up with me in one month's time to recheck her below-knee wound. He will continue local wound care on this. He will also have a follow-up duplex scan when he returns in 1 month.  Ruta Hinds, MD [facility] [place] Office: [phone] Pager: [phone]

## 2015-12-07 ENCOUNTER — Ambulatory Visit: Payer: Medicare Other | Admitting: Family

## 2015-12-07 ENCOUNTER — Encounter (HOSPITAL_COMMUNITY): Payer: Medicare Other

## 2016-01-05 DIAGNOSIS — C3492 Malignant neoplasm of unspecified part of left bronchus or lung: Secondary | ICD-10-CM | POA: Diagnosis not present

## 2016-01-09 ENCOUNTER — Encounter: Payer: Self-pay | Admitting: Vascular Surgery

## 2016-01-11 ENCOUNTER — Ambulatory Visit (INDEPENDENT_AMBULATORY_CARE_PROVIDER_SITE_OTHER): Payer: Self-pay | Admitting: Vascular Surgery

## 2016-01-11 ENCOUNTER — Encounter: Payer: Self-pay | Admitting: Vascular Surgery

## 2016-01-11 ENCOUNTER — Ambulatory Visit (HOSPITAL_COMMUNITY)
Admission: RE | Admit: 2016-01-11 | Discharge: 2016-01-11 | Disposition: A | Discharge status: Home / Self Care | Payer: Medicare Other | Source: Ambulatory Visit | Attending: Vascular Surgery | Admitting: Vascular Surgery

## 2016-01-11 ENCOUNTER — Ambulatory Visit (INDEPENDENT_AMBULATORY_CARE_PROVIDER_SITE_OTHER)
Admission: RE | Admit: 2016-01-11 | Discharge: 2016-01-11 | Disposition: A | Discharge status: Home / Self Care | Payer: Medicare Other | Source: Ambulatory Visit | Attending: Vascular Surgery | Admitting: Vascular Surgery

## 2016-01-11 VITALS — BP 138/60 | HR 54 | Temp 97.2°F | Resp 14 | Ht 71.5 in | Wt 189.0 lb

## 2016-01-11 DIAGNOSIS — I739 Peripheral vascular disease, unspecified: Secondary | ICD-10-CM | POA: Insufficient documentation

## 2016-01-11 DIAGNOSIS — I10 Essential (primary) hypertension: Secondary | ICD-10-CM | POA: Insufficient documentation

## 2016-01-11 DIAGNOSIS — E7800 Pure hypercholesterolemia, unspecified: Secondary | ICD-10-CM | POA: Diagnosis not present

## 2016-01-11 NOTE — Progress Notes (Signed)
Patient is an 82 year old male who returns for postoperative follow-up today. He recently underwent right femoral to below-knee popliteal bypass with vein 10/23/2015. He has had some drainage from the below-knee incision but this is now resolved. He denies any fever or chills. He states his claudication symptoms in the right leg have completely resolved. Reports no symptoms in the left leg. Unfortunately he continues to smoke. He was counseled against this today. Medical problems remain coronary artery disease hypertension elevated cholesterol. He is on Plavix and aspirin. He is also on Zetia.  Current Outpatient Prescriptions on File Prior to Visit  Medication Sig Dispense Refill  . aspirin 81 MG tablet Take 81 mg by mouth daily.    . carvedilol (COREG) 12.5 MG tablet Take 12.5 mg by mouth 2 (two) times daily with a meal.    . clopidogrel (PLAVIX) 75 MG tablet Take 1 tablet (75 mg total) by mouth daily. 30 tablet 11  . ezetimibe (ZETIA) 10 MG tablet Take 1 tablet (10 mg total) by mouth daily. 30 tablet 11  . furosemide (LASIX) 20 MG tablet Take 1 tablet (20 mg total) by mouth daily.    Marland Kitchen lisinopril (PRINIVIL,ZESTRIL) 2.5 MG tablet Take 1 tablet (2.5 mg total) by mouth daily. 90 tablet 3  . potassium chloride (K-DUR,KLOR-CON) 10 MEQ tablet Take 1 tablet (10 mEq total) by mouth daily. 30 tablet 11  . carvedilol (COREG) 25 MG tablet Take 0.5 tablets (12.5 mg total) by mouth 2 (two) times daily with a meal. (Patient not taking: Reported on 11/30/2015) 90 tablet 3  . cephALEXin (KEFLEX) 500 MG capsule Take 1 capsule (500 mg total) by mouth 3 (three) times daily. (Patient not taking: Reported on 11/09/2015) 30 capsule 0  . furosemide (LASIX) 20 MG tablet Take 1 tablet (20 mg total) by mouth daily. (Patient not taking: Reported on 11/30/2015) 90 tablet 3  . oxyCODONE-acetaminophen (PERCOCET/ROXICET) 5-325 MG tablet Take 1-2 tablets by mouth every 4 (four) hours as needed for moderate pain. (Patient not taking:  Reported on 11/09/2015) 30 tablet 0   No current facility-administered medications on file prior to visit.   Past Medical History  Diagnosis Date  . Coronary artery disease   . Hypertension   . PAD (peripheral artery disease) ([place])   . Hypothyroidism   . Stroke ([place]) 11/2008  . Pneumonia 09/2013  . Hypercholesteremia   . Solitary pulmonary nodule on lung CT 12/13/13   11/15/14  . Cancer ([place])     L lung  . Radiation april 2016   Review of systems: He has shortness of breath with exertion. He denies chest pain. He has chronic back pain from a chronic spine compression fracture.  Physical exam:  Filed Vitals:   01/11/16 1012  BP: 138/60  Pulse: 54  Temp: 97.2 F (36.2 C)  TempSrc: Oral  Resp: 14  Height: 5' 11.5" (1.816 m)  Weight: 189 lb (85.73 kg)  SpO2: 99%     Abdomen: Soft nontender nondistended no mass   Right lower extremity healed incisions.   Minimal swelling at this point biphasic PT monophasic DP Doppler 1+ PT pulse Left lower extremity: No ulcers no palpable pedal pulses  Data: Patient had a graft duplex exam today which showed no narrowing throughout the bypass graft although there was a fluid collection near the distal anastomosis. ABI on the right was 0.92. Left was 0.44.  Assessment: Patent right femoral below-knee popliteal bypass, now asymptomatic Plan: Patient will follow-up in 3 months with repeat graft  duplex and bilateral ABIs  Ruta Hinds, MD [facility] Office: [phone] Pager: [phone]

## 2016-01-11 NOTE — Addendum Note (Signed)
Addended by: Dorthula Rue L on: 01/11/2016 04:59 PM   Modules accepted: Orders

## 2016-01-23 DIAGNOSIS — H2513 Age-related nuclear cataract, bilateral: Secondary | ICD-10-CM | POA: Diagnosis not present

## 2016-02-27 ENCOUNTER — Ambulatory Visit (INDEPENDENT_AMBULATORY_CARE_PROVIDER_SITE_OTHER): Payer: Medicare Other | Admitting: Cardiology

## 2016-02-27 ENCOUNTER — Encounter: Payer: Self-pay | Admitting: Cardiology

## 2016-02-27 VITALS — BP 144/66 | HR 56 | Ht 71.0 in | Wt 190.0 lb

## 2016-02-27 DIAGNOSIS — I739 Peripheral vascular disease, unspecified: Secondary | ICD-10-CM

## 2016-02-27 DIAGNOSIS — E785 Hyperlipidemia, unspecified: Secondary | ICD-10-CM

## 2016-02-27 DIAGNOSIS — I2510 Atherosclerotic heart disease of native coronary artery without angina pectoris: Secondary | ICD-10-CM | POA: Diagnosis not present

## 2016-02-27 DIAGNOSIS — I255 Ischemic cardiomyopathy: Secondary | ICD-10-CM | POA: Diagnosis not present

## 2016-02-27 DIAGNOSIS — I10 Essential (primary) hypertension: Secondary | ICD-10-CM | POA: Diagnosis not present

## 2016-02-27 MED ORDER — LISINOPRIL 5 MG PO TABS: 5.0000 mg | ORAL_TABLET | Freq: Every day | ORAL | Status: DC

## 2016-02-27 NOTE — Patient Instructions (Signed)
   Increase Lisinopril to '5mg'$  daily - new sent to [facility] for 90 day supply today. Continue all other medications.   Lab for BMET - due in 2 weeks, order given today. Office will contact with results via phone or letter.   Your physician wants you to follow up in:  4 months.  You will receive a reminder letter in the mail one-two months in advance.  If you don't receive a letter, please call our office to schedule the follow up appointment

## 2016-02-27 NOTE — Progress Notes (Addendum)
Patient ID: WITTEN CERTAIN, male   DOB: 28-Nov-1931, 82 y.o.   MRN: [id number]     Clinical Summary Gary Odonnell is a 82 y.o.male seen today for follow up of the following medical problems.    1. CAD/ICM  - history of prior CABG 2003 in [place]  - 09/2013 admission to [facility] with decompensated heart failure. Workup included an echo which showed an LVEF of 15%. He was transferred to [facility] for further management.  - cath 09/2013 showed LM 30%, LAD 100% proximal, LCX 50% ostial, RCA 100% mid with distal vessel filling with right to right and left to right collaterals. LIMA-LAD patent, graft from [place] to OM1, OM2, PDA occluded. No PCI targets, recommendations for medical management. RHC mean PA 43, wedge 22, CI 1.74.  - repeat echo 04/2014 shows LVEF improved to 40-45%.    - denies any chest pain since last visit. No SOB or DOE. No LE edema, no orthopnea - compliant with meds - limiting sodium intake. Avoiding NSAIDs   2. Hyperlipidemia  - compliant with zetia  - lipitor and crestor have caused prior muscle aches, not on statin  - 08/2015 TC 183 TG 91 HDL 37 LDL 128  3. Lung CA - has completed radiation treatment.  - followed by Dr Dwana Curd  4. PAD - followed by vascular, previous stenting to left external iliac and right common iliac.  - s/p right femoral to below-knee popliteal bypass with vein 10/23/2015.  5. Carotid stenosis - repeat US 01/2015 with only mild bilateral disease.  - denies any recent neuro symptoms   Past Medical History  Diagnosis Date  . Coronary artery disease   . Hypertension   . PAD (peripheral artery disease) ([place])   . Hypothyroidism   . Stroke ([place]) 11/2008  . Pneumonia 09/2013  . Hypercholesteremia   . Solitary pulmonary nodule on lung CT 12/13/13   11/15/14  . Cancer ([place])     L lung  . Radiation april 2016     No Known Allergies   Current Outpatient Prescriptions  Medication Sig Dispense Refill  . aspirin 81 MG tablet  Take 81 mg by mouth daily.    . carvedilol (COREG) 12.5 MG tablet Take 12.5 mg by mouth 2 (two) times daily with a meal.    . carvedilol (COREG) 25 MG tablet Take 0.5 tablets (12.5 mg total) by mouth 2 (two) times daily with a meal. (Patient not taking: Reported on 11/30/2015) 90 tablet 3  . cephALEXin (KEFLEX) 500 MG capsule Take 1 capsule (500 mg total) by mouth 3 (three) times daily. (Patient not taking: Reported on 11/09/2015) 30 capsule 0  . clopidogrel (PLAVIX) 75 MG tablet Take 1 tablet (75 mg total) by mouth daily. 30 tablet 11  . ezetimibe (ZETIA) 10 MG tablet Take 1 tablet (10 mg total) by mouth daily. 30 tablet 11  . furosemide (LASIX) 20 MG tablet Take 1 tablet (20 mg total) by mouth daily.    . furosemide (LASIX) 20 MG tablet Take 1 tablet (20 mg total) by mouth daily. (Patient not taking: Reported on 11/30/2015) 90 tablet 3  . lisinopril (PRINIVIL,ZESTRIL) 2.5 MG tablet Take 1 tablet (2.5 mg total) by mouth daily. 90 tablet 3  . oxyCODONE-acetaminophen (PERCOCET/ROXICET) 5-325 MG tablet Take 1-2 tablets by mouth every 4 (four) hours as needed for moderate pain. (Patient not taking: Reported on 11/09/2015) 30 tablet 0  . potassium chloride (K-DUR,KLOR-CON) 10 MEQ tablet Take 1 tablet (10 mEq total)  by mouth daily. 30 tablet 11   No current facility-administered medications for this visit.     Past Surgical History  Procedure Laterality Date  . Knee arthroscopy Right   . Trigger finger release Bilateral   . Stent to right femoral artery    . Angioplasty to left femoral artery    . Coronary artery bypass graft      x4  . Carpal tunnel release Left 05/11/2014    Procedure: LEFT CARPAL TUNNEL RELEASE;  Surgeon: Carole Civil, MD;  Location: [facility] ORS;  Service: Orthopedics;  Laterality: Left;  . Left and right heart catheterization with coronary angiogram N/A 09/29/2013    Procedure: LEFT AND RIGHT HEART CATHETERIZATION WITH CORONARY ANGIOGRAM;  Surgeon: Burnell Blanks, MD;   Location: [facility] CATH LAB;  Service: Cardiovascular;  Laterality: N/A;  . Colonoscopy N/A 01/24/2015    Procedure: COLONOSCOPY;  Surgeon: Danie Binder, MD;  Location: [facility] ENDO SUITE;  Service: Endoscopy;  Laterality: N/A;  130  . Peripheral vascular catheterization N/A 04/21/2015    Procedure: Abdominal Aortogram w/Lower Extremity;  Surgeon: [name], MD;  Location: [place] CV LAB;  Service: Cardiovascular;  Laterality: N/A;  . Femoral-popliteal bypass graft Right 10/23/2015    Procedure: BYPASS GRAFT FEMORAL TO BELOW KNEE POPLITEAL ARTERY USING RIGHT NON-REVERSED GREATER SAPPHENOUS VEIN;  Surgeon: [name], MD;  Location: [place];  Service: Vascular;  Laterality: Right;  . Vein harvest Right 10/23/2015    Procedure: [place];  Surgeon: [name], MD;  Location: [place];  Service: Vascular;  Laterality: Right;  . Intraoperative arteriogram Right 10/23/2015    Procedure: INTRA OPERATIVE ARTERIOGRAM - RIGHT LOWER LEG;  Surgeon: [name], MD;  Location: [facility] OR;  Service: Vascular;  Laterality: Right;     No Known Allergies    Family History  Problem Relation Age of Onset  . Heart attack Father   . Hyperlipidemia Father   . Diabetes Mother   . Colon cancer Neg Hx   . Diabetes Brother      Social History Gary Odonnell reports that he has been smoking Cigars.  He started smoking about 66 years ago. He has never used smokeless tobacco. Gary Odonnell reports that he does not drink alcohol.   Review of Systems CONSTITUTIONAL: No weight loss, fever, chills, weakness or fatigue.  HEENT: Eyes: No visual loss, blurred vision, double vision or yellow sclerae.No hearing loss, sneezing, congestion, runny nose or sore throat.  SKIN: No rash or itching.  CARDIOVASCULAR: per HPI RESPIRATORY: No shortness of breath, cough or sputum.  GASTROINTESTINAL: No anorexia, nausea, vomiting or diarrhea. No abdominal pain or blood.  GENITOURINARY: No burning  on urination, no polyuria NEUROLOGICAL: No headache, dizziness, syncope, paralysis, ataxia, numbness or tingling in the extremities. No change in bowel or bladder control.  MUSCULOSKELETAL: No muscle, back pain, joint pain or stiffness.  LYMPHATICS: No enlarged nodes. No history of splenectomy.  PSYCHIATRIC: No history of depression or anxiety.  ENDOCRINOLOGIC: No reports of sweating, cold or heat intolerance. No polyuria or polydipsia.  Marland Kitchen   Physical Examination Filed Vitals:   02/27/16 0844  BP: 144/66  Pulse: 56   Filed Vitals:   02/27/16 0844  Height: '5\' 11"'$  (1.803 m)  Weight: 190 lb (86.183 kg)    Gen: resting comfortably, no acute distress HEENT: no scleral icterus, pupils equal round and reactive, no palptable cervical adenopathy,  CV: RRR, no m/r/g, no jvd Resp: Clear  to auscultation bilaterally GI: abdomen is soft, non-tender, non-distended, normal bowel sounds, no hepatosplenomegaly MSK: extremities are warm, no edema.  Skin: warm, no rash Neuro:  no focal deficits Psych: appropriate affect   Diagnostic Studies 09/29/13 Cath  Hemodynamic Findings:  Ao: 162/70  LV: 163/19/29  RA: 12  RV: 66/7/16  PA: 67/26 (mean 43)  PCWP: 22  Fick Cardiac Output: 3.5 L/min  Fick Cardiac Index: 1.74 L/min/m2  Central Aortic Saturation: 91%  Pulmonary Artery Saturation: 50%  Angiographic Findings:  Left main: Distal 30% stenosis.  Left Anterior Descending Artery: 100% proximal occlusion. Proximal, mid and distal vessel fills from the patent IMA graft.  Circumflex Artery: Large caliber vessel with large intermediate Teagon Kron. Diffuse non-obstructive plaque in the large bifurcating intermediate Angelica Wix. The ostium of the Circumflex has 50% stenosis.  Right Coronary Artery: Large dominant vessel with 100% mid occlusion. The distal vessel fills right to right bridging collaterals and left to right collaterals.  Graft Anatomy:  LIMA to LAD is patent  Free Radial (? Y  graft from LIMA) to OM1, OM2, PDA is occluded.  Left Ventricular Angiogram: Deferred.  Impression:  1. Triple vessel CAD with patent LIMA to LAD, occluded RCA with good collateral filling, moderate disease in Circumflex  2. No focal targets for PCI  3. Ischemic cardiomyopathy with acute systolic CHF   00/9/38 Echo  LVEF 15-20%, moderate LVH, severe diffuse hypokinesis, restrictive diastolic dysfunction, multiple WMAs, moderate MR, severe RV dysfunction RV TAPSE 0.7 cm, PASP 61 mmHg.   12/2013 Lab  TC 175 TG 111 LDL 116 HDL 37  TSH 7.7 HgbA1c 6.6 Na 143 K 4.5 Cr 1.22 GFR56   04/2014 Echo Study Conclusions  - Left ventricle: The cavity size was normal. Wall thickness was increased in a pattern of mild LVH. Systolic function was mildly to moderately reduced. The estimated ejection fraction was in the range of 40% to 45%. There is diastolic dysfunction, indeterminant grade. - Regional wall motion abnormality: Hypokinesis of the mid anterior and basal-mid anteroseptal myocardium. - Aortic valve: Moderately calcified annulus. Trileaflet; moderately thickened leaflets. Valve area (VTI): 2.48 cm^2. Valve area (Vmax): 2.23 cm^2. - Mitral valve: Moderately calcified annulus. Mildly thickened leaflets . - Left atrium: The atrium was moderately dilated. - Right atrium: The atrium was mildly dilated. - Technically adequate study.   01/2015 Carotid US bilaterall 1-39%     Assessment and Plan  1. CAD/ICM  - previously severe LV systolic dysfunction, LVEF 15-20%. Repeat echo shows improved function at 40-45%. He is NYHA II. Last cath described above.  - EKG in clinic shows no acute ischemic changes - will not titrate beta blocker due to bradycardia. Will increase lisionpril to '5mg'$  daily, check BMET in 2 weeks.   2. Hyperlipidemia  - intolerant to statins, continue zetia.   3. PAD - continue to follow with vascular   4. Carotid stenosis - mild bilateral disease by [place]  01/2015 - no neuro symptoms - continue to monitor  5. HTN - above goal, we will increase lisonpril to '5mg'$  daily.    F/u 4 months     Arnoldo Lenis, M.D.

## 2016-02-29 ENCOUNTER — Other Ambulatory Visit (HOSPITAL_COMMUNITY)
Admission: RE | Admit: 2016-02-29 | Discharge: 2016-02-29 | Disposition: A | Discharge status: Home / Self Care | Payer: Medicare Other | Source: Ambulatory Visit | Attending: Cardiology | Admitting: Cardiology

## 2016-02-29 DIAGNOSIS — I10 Essential (primary) hypertension: Secondary | ICD-10-CM | POA: Diagnosis not present

## 2016-02-29 LAB — BASIC METABOLIC PANEL
ANION GAP: 7 (ref 5–15)
BUN: 15 mg/dL (ref 6–20)
CHLORIDE: 104 mmol/L (ref 101–111)
CO2: 28 mmol/L (ref 22–32)
Calcium: 9.6 mg/dL (ref 8.9–10.3)
Creatinine, Ser: 1.12 mg/dL (ref 0.61–1.24)
GFR calc non Af Amer: 60 mL/min — ABNORMAL LOW (ref >60)
Glucose, Bld: 118 mg/dL — ABNORMAL HIGH (ref 65–99)
Potassium: 4.1 mmol/L (ref 3.5–5.1)
Sodium: 139 mmol/L (ref 135–145)

## 2016-04-05 ENCOUNTER — Encounter: Payer: Self-pay | Admitting: Family

## 2016-04-11 ENCOUNTER — Encounter: Payer: Self-pay | Admitting: Surgery

## 2016-04-11 ENCOUNTER — Ambulatory Visit (HOSPITAL_COMMUNITY)
Admission: RE | Admit: 2016-04-11 | Discharge: 2016-04-11 | Disposition: A | Discharge status: Home / Self Care | Payer: Medicare Other | Source: Ambulatory Visit | Attending: Family | Admitting: Family

## 2016-04-11 ENCOUNTER — Ambulatory Visit (INDEPENDENT_AMBULATORY_CARE_PROVIDER_SITE_OTHER): Payer: Medicare Other | Admitting: Family

## 2016-04-11 ENCOUNTER — Ambulatory Visit (INDEPENDENT_AMBULATORY_CARE_PROVIDER_SITE_OTHER)
Admission: RE | Admit: 2016-04-11 | Discharge: 2016-04-11 | Disposition: A | Discharge status: Home / Self Care | Payer: Medicare Other | Source: Ambulatory Visit | Attending: Family | Admitting: Family

## 2016-04-11 ENCOUNTER — Encounter: Payer: Self-pay | Admitting: Family

## 2016-04-11 VITALS — BP 157/64 | HR 51 | Temp 96.9°F | Resp 18 | Ht 71.5 in | Wt 183.0 lb

## 2016-04-11 DIAGNOSIS — I739 Peripheral vascular disease, unspecified: Secondary | ICD-10-CM | POA: Diagnosis not present

## 2016-04-11 DIAGNOSIS — I779 Disorder of arteries and arterioles, unspecified: Secondary | ICD-10-CM | POA: Diagnosis not present

## 2016-04-11 DIAGNOSIS — Z4889 Encounter for other specified surgical aftercare: Secondary | ICD-10-CM

## 2016-04-11 DIAGNOSIS — Z720 Tobacco use: Secondary | ICD-10-CM | POA: Diagnosis not present

## 2016-04-11 DIAGNOSIS — Z48812 Encounter for surgical aftercare following surgery on the circulatory system: Secondary | ICD-10-CM

## 2016-04-11 DIAGNOSIS — F17200 Nicotine dependence, unspecified, uncomplicated: Secondary | ICD-10-CM

## 2016-04-11 DIAGNOSIS — I771 Stricture of artery: Secondary | ICD-10-CM

## 2016-04-11 DIAGNOSIS — Z95828 Presence of other vascular implants and grafts: Secondary | ICD-10-CM | POA: Diagnosis not present

## 2016-04-11 NOTE — Progress Notes (Signed)
Filed Vitals:   04/11/16 1254 04/11/16 1259  BP: 146/62 157/64  Pulse: 54 51  Temp: 96.9 F (36.1 C)   Resp: 18   Height: 5' 11.5" (1.816 m)   Weight: 183 lb (83.008 kg)   SpO2: 94%

## 2016-04-11 NOTE — Patient Instructions (Signed)
Peripheral Vascular Disease Peripheral vascular disease (PVD) is a disease of the blood vessels that are not part of your heart and brain. A simple term for PVD is poor circulation. In most cases, PVD narrows the blood vessels that carry blood from your heart to the rest of your body. This can result in a decreased supply of blood to your arms, legs, and internal organs, like your stomach or kidneys. However, it most often affects a person's lower legs and feet. There are two types of PVD.  Organic PVD. This is the more common type. It is caused by damage to the structure of blood vessels.  Functional PVD. This is caused by conditions that make blood vessels contract and tighten (spasm). Without treatment, PVD tends to get worse over time. PVD can also lead to acute ischemic limb. This is when an arm or limb suddenly has trouble getting enough blood. This is a medical emergency. CAUSES Each type of PVD has many different causes. The most common cause of PVD is buildup of a fatty material (plaque) inside of your arteries (atherosclerosis). Small amounts of plaque can break off from the walls of the blood vessels and become lodged in a smaller artery. This blocks blood flow and can cause acute ischemic limb. Other common causes of PVD include:  Blood clots that form inside of blood vessels.  Injuries to blood vessels.  Diseases that cause inflammation of blood vessels or cause blood vessel spasms.  Health behaviors and health history that increase your risk of developing PVD. RISK FACTORS  You may have a greater risk of PVD if you:  Have a family history of PVD.  Have certain medical conditions, including:  High cholesterol.  Diabetes.  High blood pressure (hypertension).  Coronary heart disease.  Past problems with blood clots.  Past injury, such as burns or a broken bone. These may have damaged blood vessels in your limbs.  Buerger disease. This is caused by inflamed blood  vessels in your hands and feet.  Some forms of arthritis.  Rare birth defects that affect the arteries in your legs.  Use tobacco.  Do not get enough exercise.  Are obese.  Are age 50 or older. SIGNS AND SYMPTOMS  PVD may cause many different symptoms. Your symptoms depend on what part of your body is not getting enough blood. Some common signs and symptoms include:  Cramps in your lower legs. This may be a symptom of poor leg circulation (claudication).  Pain and weakness in your legs while you are physically active that goes away when you rest (intermittent claudication).  Leg pain when at rest.  Leg numbness, tingling, or weakness.  Coldness in a leg or foot, especially when compared with the other leg.  Skin or hair changes. These can include:  Hair loss.  Shiny skin.  Pale or bluish skin.  Thick toenails.  Inability to get or maintain an erection (erectile dysfunction). People with PVD are more prone to developing ulcers and sores on their toes, feet, or legs. These may take longer than normal to heal. DIAGNOSIS Your health care provider may diagnose PVD from your signs and symptoms. The health care provider will also do a physical exam. You may have tests to find out what is causing your PVD and determine its severity. Tests may include:  Blood pressure recordings from your arms and legs and measurements of the strength of your pulses (pulse volume recordings).  Imaging studies using sound waves to take pictures of   the blood flow through your blood vessels (Doppler ultrasound).  Injecting a dye into your blood vessels before having imaging studies using:  X-rays (angiogram or arteriogram).  Computer-generated X-rays (CT angiogram).  A powerful electromagnetic field and a computer (magnetic resonance angiogram or MRA). TREATMENT Treatment for PVD depends on the cause of your condition and the severity of your symptoms. It also depends on your age. Underlying  causes need to be treated and controlled. These include long-lasting (chronic) conditions, such as diabetes, high cholesterol, and high blood pressure. You may need to first try making lifestyle changes and taking medicines. Surgery may be needed if these do not work. Lifestyle changes may include:  Quitting smoking.  Exercising regularly.  Following a low-fat, low-cholesterol diet. Medicines may include:  Blood thinners to prevent blood clots.  Medicines to improve blood flow.  Medicines to improve your blood cholesterol levels. Surgical procedures may include:  A procedure that uses an inflated balloon to open a blocked artery and improve blood flow (angioplasty).  A procedure to put in a tube (stent) to keep a blocked artery open (stent implant).  Surgery to reroute blood flow around a blocked artery (peripheral bypass surgery).  Surgery to remove dead tissue from an infected wound on the affected limb.  Amputation. This is surgical removal of the affected limb. This may be necessary in cases of acute ischemic limb that are not improved through medical or surgical treatments. HOME CARE INSTRUCTIONS  Take medicines only as directed by your health care provider.  Do not use any tobacco products, including cigarettes, chewing tobacco, or electronic cigarettes. If you need help quitting, ask your health care provider.  Lose weight if you are overweight, and maintain a healthy weight as directed by your health care provider.  Eat a diet that is low in fat and cholesterol. If you need help, ask your health care provider.  Exercise regularly. Ask your health care provider to suggest some good activities for you.  Use compression stockings or other mechanical devices as directed by your health care provider.  Take good care of your feet.  Wear comfortable shoes that fit well.  Check your feet often for any cuts or sores. SEEK MEDICAL CARE IF:  You have cramps in your legs  while walking.  You have leg pain when you are at rest.  You have coldness in a leg or foot.  Your skin changes.  You have erectile dysfunction.  You have cuts or sores on your feet that are not healing. SEEK IMMEDIATE MEDICAL CARE IF:  Your arm or leg turns cold and blue.  Your arms or legs become red, warm, swollen, painful, or numb.  You have chest pain or trouble breathing.  You suddenly have weakness in your face, arm, or leg.  You become very confused or lose the ability to speak.  You suddenly have a very bad headache or lose your vision.   This information is not intended to replace advice given to you by your health care provider. Make sure you discuss any questions you have with your health care provider.   Document Released: 12/19/2004 Document Revised: 12/02/2014 Document Reviewed: 04/21/2014 Elsevier Interactive Patient Education 2016 Elsevier Inc.     Steps to Quit Smoking  Smoking tobacco can be harmful to your health and can affect almost every organ in your body. Smoking puts you, and those around you, at risk for developing many serious chronic diseases. Quitting smoking is difficult, but it is one of   the best things that you can do for your health. It is never too late to quit. WHAT ARE THE BENEFITS OF QUITTING SMOKING? When you quit smoking, you lower your risk of developing serious diseases and conditions, such as:  Lung cancer or lung disease, such as COPD.  Heart disease.  Stroke.  Heart attack.  Infertility.  Osteoporosis and bone fractures. Additionally, symptoms such as coughing, wheezing, and shortness of breath may get better when you quit. You may also find that you get sick less often because your body is stronger at fighting off colds and infections. If you are pregnant, quitting smoking can help to reduce your chances of having a baby of low birth weight. HOW DO I GET READY TO QUIT? When you decide to quit smoking, create a plan to  make sure that you are successful. Before you quit:  Pick a date to quit. Set a date within the next two weeks to give you time to prepare.  Write down the reasons why you are quitting. Keep this list in places where you will see it often, such as on your bathroom mirror or in your car or wallet.  Identify the people, places, things, and activities that make you want to smoke (triggers) and avoid them. Make sure to take these actions:  Throw away all cigarettes at home, at work, and in your car.  Throw away smoking accessories, such as ashtrays and lighters.  Clean your car and make sure to empty the ashtray.  Clean your home, including curtains and carpets.  Tell your family, friends, and coworkers that you are quitting. Support from your loved ones can make quitting easier.  Talk with your health care provider about your options for quitting smoking.  Find out what treatment options are covered by your health insurance. WHAT STRATEGIES CAN I USE TO QUIT SMOKING?  Talk with your healthcare provider about different strategies to quit smoking. Some strategies include:  Quitting smoking altogether instead of gradually lessening how much you smoke over a period of time. Research shows that quitting "cold turkey" is more successful than gradually quitting.  Attending in-person counseling to help you build problem-solving skills. You are more likely to have success in quitting if you attend several counseling sessions. Even short sessions of 10 minutes can be effective.  Finding resources and support systems that can help you to quit smoking and remain smoke-free after you quit. These resources are most helpful when you use them often. They can include:  Online chats with a counselor.  Telephone quitlines.  Printed self-help materials.  Support groups or group counseling.  Text messaging programs.  Mobile phone applications.  Taking medicines to help you quit smoking. (If you are  pregnant or breastfeeding, talk with your health care provider first.) Some medicines contain nicotine and some do not. Both types of medicines help with cravings, but the medicines that include nicotine help to relieve withdrawal symptoms. Your health care provider may recommend:  Nicotine patches, gum, or lozenges.  Nicotine inhalers or sprays.  Non-nicotine medicine that is taken by mouth. Talk with your health care provider about combining strategies, such as taking medicines while you are also receiving in-person counseling. Using these two strategies together makes you more likely to succeed in quitting than if you used either strategy on its own. If you are pregnant or breastfeeding, talk with your health care provider about finding counseling or other support strategies to quit smoking. Do not take medicine to help you   quit smoking unless told to do so by your health care provider. WHAT THINGS CAN I DO TO MAKE IT EASIER TO QUIT? Quitting smoking might feel overwhelming at first, but there is a lot that you can do to make it easier. Take these important actions:  Reach out to your family and friends and ask that they support and encourage you during this time. Call telephone quitlines, reach out to support groups, or work with a counselor for support.  Ask people who smoke to avoid smoking around you.  Avoid places that trigger you to smoke, such as bars, parties, or smoke-break areas at work.  Spend time around people who do not smoke.  Lessen stress in your life, because stress can be a smoking trigger for some people. To lessen stress, try:  Exercising regularly.  Deep-breathing exercises.  Yoga.  Meditating.  Performing a body scan. This involves closing your eyes, scanning your body from head to toe, and noticing which parts of your body are particularly tense. Purposefully relax the muscles in those areas.  Download or purchase mobile phone or tablet apps (applications)  that can help you stick to your quit plan by providing reminders, tips, and encouragement. There are many free apps, such as QuitGuide from the CDC (Centers for Disease Control and Prevention). You can find other support for quitting smoking (smoking cessation) through smokefree.gov and other websites. HOW WILL I FEEL WHEN I QUIT SMOKING? Within the first 24 hours of quitting smoking, you may start to feel some withdrawal symptoms. These symptoms are usually most noticeable 2-3 days after quitting, but they usually do not last beyond 2-3 weeks. Changes or symptoms that you might experience include:  Mood swings.  Restlessness, anxiety, or irritation.  Difficulty concentrating.  Dizziness.  Strong cravings for sugary foods in addition to nicotine.  Mild weight gain.  Constipation.  Nausea.  Coughing or a sore throat.  Changes in how your medicines work in your body.  A depressed mood.  Difficulty sleeping (insomnia). After the first 2-3 weeks of quitting, you may start to notice more positive results, such as:  Improved sense of smell and taste.  Decreased coughing and sore throat.  Slower heart rate.  Lower blood pressure.  Clearer skin.  The ability to breathe more easily.  Fewer sick days. Quitting smoking is very challenging for most people. Do not get discouraged if you are not successful the first time. Some people need to make many attempts to quit before they achieve long-term success. Do your best to stick to your quit plan, and talk with your health care provider if you have any questions or concerns.   This information is not intended to replace advice given to you by your health care provider. Make sure you discuss any questions you have with your health care provider.   Document Released: 11/05/2001 Document Revised: 03/28/2015 Document Reviewed: 03/28/2015 Elsevier Interactive Patient Education 2016 Elsevier Inc.  

## 2016-04-11 NOTE — Progress Notes (Signed)
[facility] [place]   CC: Follow up peripheral artery occlusive disease  History of Present Illness Gary Odonnell is a 82 y.o. male patient of Dr. Oneida Alar who is s/p right femoral to below-knee popliteal bypass with vein on 10/23/2015. He is also s/p left EIA stent placed on 04/11/15.  He returns today for 3 months follow up.  He states his claudication symptoms in the right leg have completely resolved. Reports no symptoms in the left leg. Unfortunately he continues to smoke. Medical problems remain coronary artery disease, hypertension, elevated cholesterol. He is on aspirin. He is also on Zetia.  Pt last saw Dr. Oneida Alar on 01/11/16. At that time the right lower extremity incisions were healed, minimal swelling of his right leg;  biphasic PT,  monophasic DP by Doppler; 1+ PT pulse, Pt's left lower extremity had no ulcers and no palpable pedal pulses. Graft duplex at that time showed no narrowing throughout the bypass graft although there was a fluid collection near the distal anastomosis. ABI on the right was 0.92. Left was 0.44. Asymptomatic patent right femoral below-knee popliteal bypass. The patient was to follow-up in 3 months with repeat graft duplex and bilateral ABIs.  Pt states he has a compression fracture in T12, states he only has pain when he strenuously uses his arms. He denies non healing wounds.  Pt states he had a stroke about 2006 as manifested by expressive aphasia, denies hemiparesis or vision difficulties, denies any subsequent stroke or TIA.  He had a CABG x 4 vessels in 2015, denies hx of MI. He had radiation tx in 2016 for a small lung cancer lesion, no surgery for this.  Pt Diabetic: No Pt smoker: smoker  (1 ppd of small cigars, started smoking at age 86 yrs)  Pt meds include: Statin :No, caused severe bilateral hip pain Betablocker: Yes ASA: Yes Other anticoagulants/antiplatelets: pt stopped Plavix about about April 2017, states he  informed his cardiologist of this, Dr. Harl Bowie, states Dr. Harl Bowie preferred that pt remain on Plavix but pt disagrees as he feels his bruising is excessive   Past Medical History  Diagnosis Date  . Coronary artery disease   . Hypertension   . PAD (peripheral artery disease) ([place])   . Hypothyroidism   . Stroke ([place]) 11/2008  . Pneumonia 09/2013  . Hypercholesteremia   . Solitary pulmonary nodule on lung CT 12/13/13   11/15/14  . Cancer ([place])     L lung  . Radiation april 2016    Social History Social History  Substance Use Topics  . Smoking status: Current Every Day Smoker -- 1.00 packs/day for 60 years    Types: Cigars    Start date: 11/21/1947  . Smokeless tobacco: Never Used     Comment: former cigarette smoker/smoked cigarettes for 30 years  . Alcohol Use: No    Family History Family History  Problem Relation Age of Onset  . Heart attack Father   . Hyperlipidemia Father   . Diabetes Mother   . Colon cancer Neg Hx   . Diabetes Brother     Past Surgical History  Procedure Laterality Date  . Knee arthroscopy Right   . Trigger finger release Bilateral   . Stent to right femoral artery    . Angioplasty to left femoral artery    . Coronary artery bypass graft      x4  . Carpal tunnel release Left 05/11/2014    Procedure: LEFT CARPAL TUNNEL RELEASE;  Surgeon:  Carole Civil, MD;  Location: [facility];  Service: Orthopedics;  Laterality: Left;  . Left and right heart catheterization with coronary angiogram N/A 09/29/2013    Procedure: LEFT AND RIGHT HEART CATHETERIZATION WITH CORONARY ANGIOGRAM;  Surgeon: Burnell Blanks, MD;  Location: [facility] CATH LAB;  Service: Cardiovascular;  Laterality: N/A;  . Colonoscopy N/A 01/24/2015    Procedure: COLONOSCOPY;  Surgeon: Danie Binder, MD;  Location: [facility] SU[facility];  Service: Endoscopy;  Laterality: N/A;  130  . Peripheral vascular catheterization N/A 04/21/2015    Procedure: Abdominal Aortogram w/Lower Extremity;  Surgeon: [name], MD;  Location: [facility] CV LAB;  Service: Cardiovascular;  Laterality: N/A;  . Femoral-popliteal bypass graft Right 10/23/2015    Procedure: BYPASS GRAFT FEMORAL TO BELOW KNEE POPLITEAL ARTERY USING RIGHT NON-REVERSED GREATER SAPPHENOUS VEIN;  Surgeon: [name], MD;  Location: [facility];  Service: Vascular;  Laterality: Right;  . Vein harvest Right 10/23/2015    Procedure: [place];  Surgeon: [name], MD;  Location: [facility];  Service: Vascular;  Laterality: Right;  . Intraoperative arteriogram Right 10/23/2015    Procedure: INTRA OPERATIVE ARTERIOGRAM - RIGHT LOWER LEG;  Surgeon: [name], MD;  Location: [facility] OR;  Service: Vascular;  Laterality: Right;    No Known Allergies  Current Outpatient Prescriptions  Medication Sig Dispense Refill  . aspirin 81 MG tablet Take 81 mg by mouth daily.    . carvedilol (COREG) 25 MG tablet Take 0.5 tablets (12.5 mg total) by mouth 2 (two) times daily with a meal. 90 tablet 3  . ezetimibe (ZETIA) 10 MG tablet Take 1 tablet (10 mg total) by mouth daily. 30 tablet 11  . furosemide (LASIX) 20 MG tablet Take 1 tablet (20 mg total) by mouth daily.    Marland Kitchen lisinopril (PRINIVIL,ZESTRIL) 5 MG tablet Take 1 tablet (5 mg total) by mouth daily. 90 tablet 3  . potassium chloride (K-DUR,KLOR-CON) 10 MEQ tablet Take 1 tablet (10 mEq total) by mouth daily. 30 tablet 11  . clopidogrel (PLAVIX) 75 MG tablet Take 1 tablet (75 mg total) by mouth daily. (Patient not taking: Reported on 02/27/2016) 30 tablet 11   No current facility-administered medications for this visit.    ROS: See HPI for pertinent positives and negatives.   Physical Examination  Filed Vitals:   04/11/16 1254 04/11/16 1259  BP: 146/62 157/64  Pulse: 54 51  Temp: 96.9 F (36.1 C)   Resp: 18   Height: 5' 11.5" (1.816 m)   Weight: 183 lb (83.008 kg)   SpO2: 94%    Body mass index is 25.17 kg/(m^2).  General: A&O x 3, WDWN. Gait:  normal Eyes: PERRLA. Pulmonary: Non labored respirations, limited air movement in all fields, no wheezes, rales or rhonchi. Cardiac: regular rhythm, no detected murmur.         Carotid Bruits Right Left   Negative Negative  Aorta is not palpable. Radial pulses: 2+ palpable                           VASCULAR EXAM: Extremities without ischemic changes, without Gangrene; without open wounds.  LE Pulses Right Left       FEMORAL  not palpable   palpable        POPLITEAL  not palpable   not palpable       POSTERIOR TIBIAL  not palpable   not palpable        DORSALIS PEDIS      ANTERIOR TIBIAL not palpable  not palpable    Abdomen: soft, NT, no palpable masses. Skin: no rashes, no ulcers. Musculoskeletal: no muscle wasting or atrophy.  Neurologic: A&O X 3; Appropriate Affect ; SENSATION: normal; MOTOR FUNCTION:  moving all extremities equally, motor strength 5/5 throughout. Speech is fluent/normal.  CN 2-12 intact except for some mild hearing loss.    Non-Invasive Vascular Imaging: DATE: 04/11/2016  Right LE Arterial Duplex: Patent right LE bypass graft with hyperplasia and elevated velocities in the native inflow artery and proximal anastomosis (305 cm/s). No focal stenosis noted in the proximal anastomosis. Elevated velocities most likely due to inflow disease.  ABI:  RIGHT: 0.69 (0.92, 01/11/16), Waveforms: biphasic PT, monophasic DP, TBI: 0.42;  LEFT: 0.39 (0.44), Waveforms: monophasic, TBI: 0.30   ASSESSMENT: STYLIANOS STRADLING is a 82 y.o. male who is s/p right femoral to below-knee popliteal bypass with vein on 10/23/2015. He is also s/p left EIA stent placed on 04/11/15.  Right LE arterial duplex today indicates a patent right LE bypass graft with hyperplasia and elevated velocities in the native inflow artery and proximal anastomosis (305 cm/s). No focal stenosis noted  in the proximal anastomosis. Elevated velocities most likely due to inflow disease.  He has a 25% drop in his operative (right) leg ABI in the past 3 months and an 11% drop in his left ABI in 3 months (left EIA stent placed 04/11/15). His atherosclerotic risk factors include active smoking (ppd), CAD, dyslipidemia, and htn. He is statin intolerant, takes Zetia and ASA. Fortunately he does not have DM and he is very active with no claudication symptoms, no signs of ischemia in his feet/legs.     PLAN:  The patient was counseled re smoking cessation and given several free resources re smoking cessation.  Graduated walking program discussed and how to achieve.    Based on the patient's vascular studies and examination,  And after discussing with Dr. Bridgett Larsson, pt will return to clinic as soon as possible, Dr. Nona Dell next available appointment, and discuss options to address   I discussed in depth with the patient the nature of atherosclerosis, and emphasized the importance of maximal medical management including strict control of blood pressure, blood glucose, and lipid levels, obtaining regular exercise, and cessation of smoking.  The patient is aware that without maximal medical management the underlying atherosclerotic disease process will progress, limiting the benefit of any interventions.  The patient was given information about PAD including signs, symptoms, treatment, what symptoms should prompt the patient to seek immediate medical care, and risk reduction measures to take.  Clemon Chambers, RN, MSN, FNP-C [facility] Phone: [phone]  Clinic MD: Bridgett Larsson  04/11/2016 1:14 PM

## 2016-04-15 ENCOUNTER — Ambulatory Visit: Payer: Medicare Other | Admitting: Surgery

## 2016-04-18 ENCOUNTER — Ambulatory Visit: Payer: Medicare Other | Admitting: Vascular Surgery

## 2016-06-26 ENCOUNTER — Encounter: Payer: Self-pay | Admitting: Cardiology

## 2016-06-26 ENCOUNTER — Ambulatory Visit (INDEPENDENT_AMBULATORY_CARE_PROVIDER_SITE_OTHER): Payer: Medicare Other | Admitting: Cardiology

## 2016-06-26 VITALS — BP 117/61 | HR 61 | Ht 71.0 in | Wt 186.2 lb

## 2016-06-26 DIAGNOSIS — E785 Hyperlipidemia, unspecified: Secondary | ICD-10-CM

## 2016-06-26 DIAGNOSIS — I779 Disorder of arteries and arterioles, unspecified: Secondary | ICD-10-CM | POA: Diagnosis not present

## 2016-06-26 DIAGNOSIS — I10 Essential (primary) hypertension: Secondary | ICD-10-CM

## 2016-06-26 DIAGNOSIS — R7309 Other abnormal glucose: Secondary | ICD-10-CM | POA: Diagnosis not present

## 2016-06-26 DIAGNOSIS — I5022 Chronic systolic (congestive) heart failure: Secondary | ICD-10-CM | POA: Diagnosis not present

## 2016-06-26 DIAGNOSIS — I739 Peripheral vascular disease, unspecified: Secondary | ICD-10-CM

## 2016-06-26 DIAGNOSIS — I2510 Atherosclerotic heart disease of native coronary artery without angina pectoris: Secondary | ICD-10-CM

## 2016-06-26 NOTE — Progress Notes (Signed)
Clinical Summary Mr. Castrillon is a 82 y.o.male seen today for follow up of the following medical problems.    1. CAD/ICM  - history of prior CABG 2003 in [place]  - 09/2013 admission to [facility] with decompensated heart failure. Workup included an echo which showed an LVEF of 15%. He was transferred to [facility] for further management.  - cath 09/2013 showed LM 30%, LAD 100% proximal, LCX 50% ostial, RCA 100% mid with distal vessel filling with right to right and left to right collaterals. LIMA-LAD patent, graft from [place] to OM1, OM2, PDA occluded. No PCI targets, recommendations for medical management. RHC mean PA 43, wedge 22, CI 1.74.  - repeat echo 04/2014 shows LVEF improved to 40-45%.    - no SOB or DOE. No recent chest pain. No recent LE edema.  - compliant with meds.    2. Hyperlipidemia  - compliant with zetia  - lipitor and crestor have caused prior muscle aches, not on statin  - 08/2015 TC 183 TG 91 HDL 37 LDL 128  3. Lung CA - has completed radiation treatment.  - followed by Dr Dwana Curd  4. PAD - followed by vascular, previous stenting to left external iliac and right common iliac.  - s/p right femoral to below-knee popliteal bypass with vein 10/23/2015.  5. Carotid stenosis - repeat US 01/2015 with only mild bilateral disease.  - denies any recent neuro symptoms since last visit Past Medical History:  Diagnosis Date  . Cancer ([place])    L lung  . Coronary artery disease   . Hypercholesteremia   . Hypertension   . Hypothyroidism   . PAD (peripheral artery disease) ([place])   . Pneumonia 09/2013  . Radiation april 2016  . Solitary pulmonary nodule on lung CT 12/13/13   11/15/14  . Stroke [facility]) 11/2008     No Known Allergies   Current Outpatient Prescriptions  Medication Sig Dispense Refill  . aspirin 81 MG tablet Take 81 mg by mouth daily.    . carvedilol (COREG) 25 MG tablet Take 0.5 tablets (12.5 mg total) by mouth 2 (two) times  daily with a meal. 90 tablet 3  . clopidogrel (PLAVIX) 75 MG tablet Take 1 tablet (75 mg total) by mouth daily. (Patient not taking: Reported on 02/27/2016) 30 tablet 11  . ezetimibe (ZETIA) 10 MG tablet Take 1 tablet (10 mg total) by mouth daily. 30 tablet 11  . furosemide (LASIX) 20 MG tablet Take 1 tablet (20 mg total) by mouth daily.    Marland Kitchen lisinopril (PRINIVIL,ZESTRIL) 5 MG tablet Take 1 tablet (5 mg total) by mouth daily. 90 tablet 3  . potassium chloride (K-DUR,KLOR-CON) 10 MEQ tablet Take 1 tablet (10 mEq total) by mouth daily. 30 tablet 11   No current facility-administered medications for this visit.      Past Surgical History:  Procedure Laterality Date  . Angioplasty to Left femoral artery    . CARPAL TUNNEL RELEASE Left 05/11/2014   Procedure: LEFT CARPAL TUNNEL RELEASE;  Surgeon: Carole Civil, MD;  Location: [facility] ORS;  Service: Orthopedics;  Laterality: Left;  . COLONOSCOPY N/A 01/24/2015   Procedure: COLONOSCOPY;  Surgeon: Danie Binder, MD;  Location: [facility] ENDO SUITE;  Service: Endoscopy;  Laterality: N/A;  130  . CORONARY ARTERY BYPASS GRAFT     x4  . FEMORAL-POPLITEAL BYPASS GRAFT Right 10/23/2015   Procedure: BYPASS GRAFT FEMORAL TO BELOW KNEE POPLITEAL ARTERY USING RIGHT NON-REVERSED GREATER SAPPHENOUS VEIN;  Surgeon: [name], MD;  Location: [place];  Service: Vascular;  Laterality: Right;  . INTRAOPERATIVE ARTERIOGRAM Right 10/23/2015   Procedure: INTRA OPERATIVE ARTERIOGRAM - RIGHT LOWER LEG;  Surgeon: [name], MD;  Location: [place];  Service: Vascular;  Laterality: Right;  . KNEE ARTHROSCOPY Right   . LEFT AND RIGHT HEART CATHETERIZATION WITH CORONARY ANGIOGRAM N/A 09/29/2013   Procedure: LEFT AND RIGHT HEART CATHETERIZATION WITH CORONARY ANGIOGRAM;  Surgeon: Burnell Blanks, MD;  Location: [facility] CATH LAB;  Service: Cardiovascular;  Laterality: N/A;  . PERIPHERAL VASCULAR CATHETERIZATION N/A 04/21/2015   Procedure: Abdominal Aortogram w/Lower Extremity;   Surgeon: [name], MD;  Location: [place] CV LAB;  Service: Cardiovascular;  Laterality: N/A;  . Stent to right femoral artery    . TRIGGER FINGER RELEASE Bilateral   . VEIN HARVEST Right 10/23/2015   Procedure: [place];  Surgeon: [name], MD;  Location: [facility] OR;  Service: Vascular;  Laterality: Right;     No Known Allergies    Family History  Problem Relation Age of Onset  . Heart attack Father   . Hyperlipidemia Father   . Diabetes Mother   . Colon cancer Neg Hx   . Diabetes Brother      Social History Mr. Henshaw reports that he has been smoking Cigars.  He started smoking about 66 years ago. He has a 60.00 pack-year smoking history. He has never used smokeless tobacco. Mr. Venus reports that he does not drink alcohol.   Review of Systems CONSTITUTIONAL: No weight loss, fever, chills, weakness or fatigue.  HEENT: Eyes: No visual loss, blurred vision, double vision or yellow sclerae.No hearing loss, sneezing, congestion, runny nose or sore throat.  SKIN: No rash or itching.  CARDIOVASCULAR: per HPI RESPIRATORY: No shortness of breath, cough or sputum.  GASTROINTESTINAL: No anorexia, nausea, vomiting or diarrhea. No abdominal pain or blood.  GENITOURINARY: No burning on urination, no polyuria NEUROLOGICAL: No headache, dizziness, syncope, paralysis, ataxia, numbness or tingling in the extremities. No change in bowel or bladder control.  MUSCULOSKELETAL: No muscle, back pain, joint pain or stiffness.  LYMPHATICS: No enlarged nodes. No history of splenectomy.  PSYCHIATRIC: No history of depression or anxiety.  ENDOCRINOLOGIC: No reports of sweating, cold or heat intolerance. No polyuria or polydipsia.  Marland Kitchen   Physical Examination Vitals:   06/26/16 0954  BP: 117/61  Pulse: 61   Vitals:   06/26/16 0954  Weight: 186 lb 3.2 oz (84.5 kg)  Height: '5\' 11"'$  (1.803 m)    Gen: resting comfortably, no acute distress HEENT:  no scleral icterus, pupils equal round and reactive, no palptable cervical adenopathy,  CV: RRR, no m/r/g, no jvd Resp: Clear to auscultation bilaterally GI: abdomen is soft, non-tender, non-distended, normal bowel sounds, no hepatosplenomegaly MSK: extremities are warm, no edema.  Skin: warm, no rash Neuro:  no focal deficits Psych: appropriate affect   Diagnostic Studies 09/29/13 Cath  Hemodynamic Findings:  Ao: 162/70  LV: 163/19/29  RA: 12  RV: 66/7/16  PA: 67/26 (mean 43)  PCWP: 22  Fick Cardiac Output: 3.5 L/min  Fick Cardiac Index: 1.74 L/min/m2  Central Aortic Saturation: 91%  Pulmonary Artery Saturation: 50%  Angiographic Findings:  Left main: Distal 30% stenosis.  Left Anterior Descending Artery: 100% proximal occlusion. Proximal, mid and distal vessel fills from the patent IMA graft.  Circumflex Artery: Large caliber vessel with large intermediate Cylus [place]. Diffuse non-obstructive plaque in the large bifurcating intermediate  Dave Mannes. The ostium of the Circumflex has 50% stenosis.  Right Coronary Artery: Large dominant vessel with 100% mid occlusion. The distal vessel fills right to right bridging collaterals and left to right collaterals.  Graft Anatomy:  LIMA to LAD is patent  Free Radial (? Y graft from LIMA) to OM1, OM2, PDA is occluded.  Left Ventricular Angiogram: Deferred.  Impression:  1. Triple vessel CAD with patent LIMA to LAD, occluded RCA with good collateral filling, moderate disease in Circumflex  2. No focal targets for PCI  3. Ischemic cardiomyopathy with acute systolic CHF   36/1/44 Echo  LVEF 15-20%, moderate LVH, severe diffuse hypokinesis, restrictive diastolic dysfunction, multiple WMAs, moderate MR, severe RV dysfunction RV TAPSE 0.7 cm, PASP 61 mmHg.   12/2013 Lab  TC 175 TG 111 LDL 116 HDL 37  TSH 7.7 HgbA1c 6.6 Na 143 K 4.5 Cr 1.22 GFR56   04/2014 Echo Study Conclusions  - Left ventricle: The cavity size was  normal. Wall thickness was increased in a pattern of mild LVH. Systolic function was mildly to moderately reduced. The estimated ejection fraction was in the range of 40% to 45%. There is diastolic dysfunction, indeterminant grade. - Regional wall motion abnormality: Hypokinesis of the mid anterior and basal-mid anteroseptal myocardium. - Aortic valve: Moderately calcified annulus. Trileaflet; moderately thickened leaflets. Valve area (VTI): 2.48 cm^2. Valve area (Vmax): 2.23 cm^2. - Mitral valve: Moderately calcified annulus. Mildly thickened leaflets . - Left atrium: The atrium was moderately dilated. - Right atrium: The atrium was mildly dilated. - Technically adequate study.   01/2015 Carotid US bilaterall 1-39%    Assessment and Plan  1. CAD/ICM  - previously severe LV systolic dysfunction, LVEF 15-20%. Repeat echo shows improved function at 40-45%. He is NYHA II. Last cath described above.  -no recent symptoms - medical therapy limited by soft bp's and low heart rates, continue current meds.   2. Hyperlipidemia  - intolerant to statins, we will continue zetia.   3. PAD - continue to follow with vascular   4. Carotid stenosis - mild bilateral disease by [place] 01/2015 - no neuro symptoms - we will continue to follow  5. HTN - at goal, continue current meds    F/u 6 months. Check annual labs.   Arnoldo Lenis, M.D.

## 2016-06-26 NOTE — Patient Instructions (Signed)
Your physician wants you to follow-up in: 6 months with Dr. Bryna Colander will receive a reminder letter in the mail two months in advance. If you don't receive a letter, please call our office to schedule the follow-up appointment.  Your physician recommends that you continue on your current medications as directed. Please refer to the Current Medication list given to you today.  Your physician recommends that you return for lab work PLEASE FAST PRIOR TO LAB WORK CBC.CMP.TSH.MG.LIPIDS.HGBA1C  Thank you for choosing [facility]!!

## 2016-07-03 ENCOUNTER — Other Ambulatory Visit (HOSPITAL_COMMUNITY)
Admission: RE | Admit: 2016-07-03 | Discharge: 2016-07-03 | Disposition: A | Discharge status: Home / Self Care | Payer: Medicare Other | Source: Ambulatory Visit | Attending: Cardiology | Admitting: Cardiology

## 2016-07-03 DIAGNOSIS — I2510 Atherosclerotic heart disease of native coronary artery without angina pectoris: Secondary | ICD-10-CM | POA: Diagnosis not present

## 2016-07-03 DIAGNOSIS — R7309 Other abnormal glucose: Secondary | ICD-10-CM | POA: Diagnosis not present

## 2016-07-03 DIAGNOSIS — I10 Essential (primary) hypertension: Secondary | ICD-10-CM | POA: Diagnosis not present

## 2016-07-03 LAB — CBC
HEMATOCRIT: 45.4 % (ref 39.0–52.0)
Hemoglobin: 15 g/dL (ref 13.0–17.0)
MCH: 30.4 pg (ref 26.0–34.0)
MCHC: 33 g/dL (ref 30.0–36.0)
MCV: 92.1 fL (ref 78.0–100.0)
PLATELETS: 166 10*3/uL (ref 150–400)
RBC: 4.93 MIL/uL (ref 4.22–5.81)
RDW: 15.1 % (ref 11.5–15.5)
WBC: 6.7 10*3/uL (ref 4.0–10.5)

## 2016-07-03 LAB — COMPREHENSIVE METABOLIC PANEL
ALT: 16 U/L — ABNORMAL LOW (ref 17–63)
ANION GAP: 6 (ref 5–15)
AST: 16 U/L (ref 15–41)
Albumin: 4 g/dL (ref 3.5–5.0)
Alkaline Phosphatase: 60 U/L (ref 38–126)
BILIRUBIN TOTAL: 0.8 mg/dL (ref 0.3–1.2)
BUN: 17 mg/dL (ref 6–20)
CHLORIDE: 100 mmol/L — AB (ref 101–111)
CO2: 31 mmol/L (ref 22–32)
Calcium: 9.8 mg/dL (ref 8.9–10.3)
Creatinine, Ser: 1.23 mg/dL (ref 0.61–1.24)
GFR, EST NON AFRICAN AMERICAN: 53 mL/min — AB (ref >60)
Glucose, Bld: 118 mg/dL — ABNORMAL HIGH (ref 65–99)
POTASSIUM: 4 mmol/L (ref 3.5–5.1)
Sodium: 137 mmol/L (ref 135–145)
TOTAL PROTEIN: 7.1 g/dL (ref 6.5–8.1)

## 2016-07-03 LAB — LIPID PANEL
CHOL/HDL RATIO: 5 ratio
Cholesterol: 186 mg/dL (ref 0–200)
HDL: 37 mg/dL — AB (ref >40)
LDL CALC: 124 mg/dL — AB (ref 0–99)
TRIGLYCERIDES: 126 mg/dL (ref <150)
VLDL: 25 mg/dL (ref 0–40)

## 2016-07-03 LAB — MAGNESIUM: MAGNESIUM: 1.9 mg/dL (ref 1.7–2.4)

## 2016-07-03 LAB — TSH: TSH: 9.429 u[IU]/mL — ABNORMAL HIGH (ref 0.350–4.500)

## 2016-07-04 ENCOUNTER — Telehealth: Payer: Self-pay | Admitting: *Deleted

## 2016-07-04 LAB — HEMOGLOBIN A1C
Hgb A1c MFr Bld: 6.3 % — ABNORMAL HIGH (ref 4.8–5.6)
MEAN PLASMA GLUCOSE: 134 mg/dL

## 2016-07-04 NOTE — Telephone Encounter (Signed)
Called patient with test results. No answer. Left message to call back.  

## 2016-07-04 NOTE — Telephone Encounter (Signed)
-----   Message from Arnoldo Lenis, MD sent at 07/04/2016  4:08 PM EDT ----- Labs show he is borderline diabetic, he needs to work on diet and exercise. Thyroid levels are low, please forward to pcp. Cholesterol is high but since he cannot tolerate other cholesterol meds levels are ok. We may discuss some other medication options at f/u.

## 2016-09-27 ENCOUNTER — Encounter (HOSPITAL_COMMUNITY): Payer: Self-pay | Admitting: Emergency Medicine

## 2016-09-27 ENCOUNTER — Emergency Department (HOSPITAL_COMMUNITY)
Admission: EM | Admit: 2016-09-27 | Discharge: 2016-09-27 | Disposition: A | Discharge status: Home / Self Care | Payer: Medicare Other | Attending: Emergency Medicine | Admitting: Emergency Medicine

## 2016-09-27 ENCOUNTER — Emergency Department (HOSPITAL_COMMUNITY): Payer: Medicare Other

## 2016-09-27 DIAGNOSIS — Y9301 Activity, walking, marching and hiking: Secondary | ICD-10-CM | POA: Insufficient documentation

## 2016-09-27 DIAGNOSIS — Y999 Unspecified external cause status: Secondary | ICD-10-CM | POA: Insufficient documentation

## 2016-09-27 DIAGNOSIS — W109XXA Fall (on) (from) unspecified stairs and steps, initial encounter: Secondary | ICD-10-CM | POA: Diagnosis not present

## 2016-09-27 DIAGNOSIS — Z79899 Other long term (current) drug therapy: Secondary | ICD-10-CM | POA: Diagnosis not present

## 2016-09-27 DIAGNOSIS — I110 Hypertensive heart disease with heart failure: Secondary | ICD-10-CM | POA: Diagnosis not present

## 2016-09-27 DIAGNOSIS — S79911A Unspecified injury of right hip, initial encounter: Secondary | ICD-10-CM | POA: Diagnosis not present

## 2016-09-27 DIAGNOSIS — Z85118 Personal history of other malignant neoplasm of bronchus and lung: Secondary | ICD-10-CM | POA: Diagnosis not present

## 2016-09-27 DIAGNOSIS — I509 Heart failure, unspecified: Secondary | ICD-10-CM | POA: Diagnosis not present

## 2016-09-27 DIAGNOSIS — M545 Low back pain: Secondary | ICD-10-CM | POA: Diagnosis not present

## 2016-09-27 DIAGNOSIS — F17290 Nicotine dependence, other tobacco product, uncomplicated: Secondary | ICD-10-CM | POA: Diagnosis not present

## 2016-09-27 DIAGNOSIS — Z7982 Long term (current) use of aspirin: Secondary | ICD-10-CM | POA: Insufficient documentation

## 2016-09-27 DIAGNOSIS — I2510 Atherosclerotic heart disease of native coronary artery without angina pectoris: Secondary | ICD-10-CM | POA: Insufficient documentation

## 2016-09-27 DIAGNOSIS — Y929 Unspecified place or not applicable: Secondary | ICD-10-CM | POA: Diagnosis not present

## 2016-09-27 DIAGNOSIS — Z951 Presence of aortocoronary bypass graft: Secondary | ICD-10-CM | POA: Insufficient documentation

## 2016-09-27 DIAGNOSIS — M25551 Pain in right hip: Secondary | ICD-10-CM | POA: Diagnosis not present

## 2016-09-27 DIAGNOSIS — S39012A Strain of muscle, fascia and tendon of lower back, initial encounter: Secondary | ICD-10-CM | POA: Diagnosis not present

## 2016-09-27 DIAGNOSIS — E039 Hypothyroidism, unspecified: Secondary | ICD-10-CM | POA: Diagnosis not present

## 2016-09-27 DIAGNOSIS — S3992XA Unspecified injury of lower back, initial encounter: Secondary | ICD-10-CM | POA: Diagnosis present

## 2016-09-27 LAB — URINALYSIS, ROUTINE W REFLEX MICROSCOPIC
Bilirubin Urine: NEGATIVE
GLUCOSE, UA: NEGATIVE mg/dL
KETONES UR: NEGATIVE mg/dL
Leukocytes, UA: NEGATIVE
Nitrite: NEGATIVE
PH: 6 (ref 5.0–8.0)
PROTEIN: NEGATIVE mg/dL
Specific Gravity, Urine: <1.005 — ABNORMAL LOW (ref 1.005–1.030)

## 2016-09-27 LAB — BASIC METABOLIC PANEL
Anion gap: 4 — ABNORMAL LOW (ref 5–15)
BUN: 17 mg/dL (ref 6–20)
CO2: 29 mmol/L (ref 22–32)
Calcium: 9.6 mg/dL (ref 8.9–10.3)
Chloride: 105 mmol/L (ref 101–111)
Creatinine, Ser: 1.16 mg/dL (ref 0.61–1.24)
GFR calc Af Amer: >60 mL/min (ref >60)
GFR, EST NON AFRICAN AMERICAN: 57 mL/min — AB (ref >60)
GLUCOSE: 88 mg/dL (ref 65–99)
POTASSIUM: 4.5 mmol/L (ref 3.5–5.1)
Sodium: 138 mmol/L (ref 135–145)

## 2016-09-27 LAB — CBC WITH DIFFERENTIAL/PLATELET
BASOS ABS: 0 10*3/uL (ref 0.0–0.1)
Basophils Relative: 0 %
EOS PCT: 6 %
Eosinophils Absolute: 0.4 10*3/uL (ref 0.0–0.7)
HCT: 40 % (ref 39.0–52.0)
Hemoglobin: 13.1 g/dL (ref 13.0–17.0)
LYMPHS PCT: 26 %
Lymphs Abs: 1.6 10*3/uL (ref 0.7–4.0)
MCH: 30.8 pg (ref 26.0–34.0)
MCHC: 32.8 g/dL (ref 30.0–36.0)
MCV: 94.1 fL (ref 78.0–100.0)
MONO ABS: 0.3 10*3/uL (ref 0.1–1.0)
MONOS PCT: 5 %
Neutro Abs: 3.9 10*3/uL (ref 1.7–7.7)
Neutrophils Relative %: 63 %
PLATELETS: 145 10*3/uL — AB (ref 150–400)
RBC: 4.25 MIL/uL (ref 4.22–5.81)
RDW: 14.1 % (ref 11.5–15.5)
WBC: 6.2 10*3/uL (ref 4.0–10.5)

## 2016-09-27 LAB — URINE MICROSCOPIC-ADD ON

## 2016-09-27 MED ORDER — NAPROXEN 500 MG PO TABS: 500.0000 mg | ORAL_TABLET | Freq: Two times a day (BID) | ORAL | 0 refills | Status: DC

## 2016-09-27 NOTE — ED Triage Notes (Signed)
Pt has back pain, but 3 weeks ago dog pulled him down four steps, back pain has increasing gotten worse since then.

## 2016-09-27 NOTE — ED Provider Notes (Signed)
[place] DEPT Provider Note   CSN: [id number] Arrival date & time: 09/27/16  1059     History   Chief Complaint Chief Complaint  Patient presents with  . Back Pain    HPI Gary Odonnell is a 82 y.o. male.  HPI   Gary Odonnell is a 82 y.o. male who presents to the Emergency Department complaining of Pain to his right lower back after a fall. He states that 3 weeks ago he was walking his dog and the dog pulled him down causing him to fall down approximately 4 steps. States that he landed on the right side of his back. He complains of right-sided low back pain that is worse with walking and standing. Pain improves with rest. He states that he has used topical analgesic patches without relief. He denies numbness or weakness of lower extremities, fever, chills, urinary or bowel changes, abdominal pain or other injuries.   Past Medical History:  Diagnosis Date  . Cancer ([place])    L lung  . Coronary artery disease   . Hypercholesteremia   . Hypertension   . Hypothyroidism   . PAD (peripheral artery disease) ([place])   . Pneumonia 09/2013  . Radiation april 2016  . Solitary pulmonary nodule on lung CT 12/13/13   11/15/14  . Stroke [facility]) 11/2008    Patient Active Problem List   Diagnosis Date Noted  . Iliac artery stenosis, left ([place]) 04/22/2015  . PAD (peripheral artery disease) ([place]) 04/21/2015  . Primary cancer of left lower lobe of lung ([place]) 02/02/2015  . Abnormal PET scan of colon 01/04/2015  . Solitary pulmonary nodule on lung CT   . Carpal tunnel syndrome, left 05/03/2014  . Acute renal failure ([place]) 09/27/2013  . HTN (hypertension) 09/26/2013  . CHF (congestive heart failure) ([place]) 09/26/2013  . CVA (cerebral infarction) 09/26/2013  . CAP (community acquired pneumonia) 09/26/2013  . CAD (coronary atherosclerotic disease) 09/26/2013  . S/P CABG x 4 09/26/2013    Past Surgical History:  Procedure Laterality Date  . Angioplasty to Left femoral artery    .  CARPAL TUNNEL RELEASE Left 05/11/2014   Procedure: LEFT CARPAL TUNNEL RELEASE;  Surgeon: Carole Civil, MD;  Location: [facility] ORS;  Service: Orthopedics;  Laterality: Left;  . COLONOSCOPY N/A 01/24/2015   Procedure: COLONOSCOPY;  Surgeon: Danie Binder, MD;  Location: [facility] ENDO SU[facility];  Service: Endoscopy;  Laterality: N/A;  130  . CORONARY ARTERY BYPASS GRAFT     x4  . FEMORAL-POPLITEAL BYPASS GRAFT Right 10/23/2015   Procedure: BYPASS GRAFT FEMORAL TO BELOW KNEE POPLITEAL ARTERY USING RIGHT NON-REVERSED GREATER SAPPHENOUS VEIN;  Surgeon: [name], MD;  Location: [place];  Service: Vascular;  Laterality: Right;  . INTRAOPERATIVE ARTERIOGRAM Right 10/23/2015   Procedure: INTRA OPERATIVE ARTERIOGRAM - RIGHT LOWER LEG;  Surgeon: [name], MD;  Location: [place];  Service: Vascular;  Laterality: Right;  . KNEE ARTHROSCOPY Right   . LEFT AND RIGHT HEART CATHETERIZATION WITH CORONARY ANGIOGRAM N/A 09/29/2013   Procedure: LEFT AND RIGHT HEART CATHETERIZATION WITH CORONARY ANGIOGRAM;  Surgeon: Burnell Blanks, MD;  Location: [facility] CATH LAB;  Service: Cardiovascular;  Laterality: N/A;  . PERIPHERAL VASCULAR CATHETERIZATION N/A 04/21/2015   Procedure: Abdominal Aortogram w/Lower Extremity;  Surgeon: [name], MD;  Location: [place] CV LAB;  Service: Cardiovascular;  Laterality: N/A;  . Stent to right femoral artery    . TRIGGER FINGER RELEASE Bilateral   . VEIN HARVEST Right 10/23/2015  Procedure: [place];  Surgeon: [name], MD;  Location: [place];  Service: Vascular;  Laterality: Right;       Home Medications    Prior to Admission medications   Medication Sig Start Date End Date Taking? Authorizing Provider  aspirin 81 MG EC tablet Take 81 mg by mouth daily.   Yes Historical Provider, MD  carvedilol (COREG) 25 MG tablet Take 0.5 tablets (12.5 mg total) by mouth 2 (two) times daily with a meal. 11/13/15  Yes Arnoldo Lenis, MD    ezetimibe (ZETIA) 10 MG tablet Take 1 tablet (10 mg total) by mouth daily. 10/01/13  Yes Rhonda G Barrett, PA-C  lisinopril (PRINIVIL,ZESTRIL) 5 MG tablet Take 1 tablet (5 mg total) by mouth daily. 02/27/16  Yes Arnoldo Lenis, MD  potassium chloride (K-DUR,KLOR-CON) 10 MEQ tablet Take 1 tablet (10 mEq total) by mouth daily. 10/01/13  Yes Rhonda G Barrett, PA-C    Family History Family History  Problem Relation Age of Onset  . Heart attack Father   . Hyperlipidemia Father   . Diabetes Mother   . Diabetes Brother   . Colon cancer Neg Hx     Social History Social History  Substance Use Topics  . Smoking status: Current Every Day Smoker    Packs/day: 1.00    Years: 60.00    Types: Cigars    Start date: 11/[birth year]/1949  . Smokeless tobacco: Never Used     Comment: former cigarette smoker/smoked cigarettes for 30 years  . Alcohol use No     Allergies   Review of patient's allergies indicates no known allergies.   Review of Systems Review of Systems  Constitutional: Negative for fever.  Respiratory: Negative for shortness of breath.   Gastrointestinal: Negative for abdominal pain, constipation and vomiting.  Genitourinary: Negative for decreased urine volume, difficulty urinating, dysuria, flank pain and hematuria.  Musculoskeletal: Positive for back pain. Negative for joint swelling.  Skin: Negative for rash.  Neurological: Negative for weakness and numbness.  All other systems reviewed and are negative.    Physical Exam Updated Vital Signs BP 174/73 (BP Location: Right Arm)   Pulse (!) 48   Temp 97.9 F (36.6 C) (Oral)   Resp 15   Ht 5' 11.5" (1.816 m)   Wt 83.9 kg   SpO2 100%   BMI 25.44 kg/m   Physical Exam  Constitutional: He is oriented to person, place, and time. He appears well-developed and well-nourished. No distress.  HENT:  Head: Normocephalic and atraumatic.  Neck: Normal range of motion. Neck supple.  Cardiovascular: Normal rate, regular rhythm and  intact distal pulses.   No murmur heard. Pulmonary/Chest: Effort normal and breath sounds normal. No respiratory distress.  Abdominal: Soft. He exhibits no distension. There is no tenderness.  Musculoskeletal: He exhibits tenderness. He exhibits no edema.       Lumbar back: He exhibits tenderness and pain. He exhibits normal range of motion, no swelling, no deformity, no laceration and normal pulse.  ttp of the right lower lumbar paraspinal muscles.  No spinal tenderness.  DP pulses are brisk and symmetrical.  Distal sensation intact.  Pt has 5/5 strength against resistance of bilateral lower extremities.     Neurological: He is alert and oriented to person, place, and time. He has normal strength. No sensory deficit. He exhibits normal muscle tone. Coordination and gait normal.  Reflex Scores:      Patellar reflexes are 2+ on the right  side and 2+ on the left side.      Achilles reflexes are 2+ on the right side and 2+ on the left side. Skin: Skin is warm and dry. No rash noted.  Nursing note and vitals reviewed.    ED Treatments / Results  Labs (all labs ordered are listed, but only abnormal results are displayed) Labs Reviewed  URINALYSIS, ROUTINE W REFLEX MICROSCOPIC (NOT AT [facility]) - Abnormal; Notable for the following:       Result Value   Specific Gravity, Urine <1.005 (*)    Hgb urine dipstick TRACE (*)    All other components within normal limits  BASIC METABOLIC PANEL - Abnormal; Notable for the following:    GFR calc non Af Amer 57 (*)    Anion gap 4 (*)    All other components within normal limits  CBC WITH DIFFERENTIAL/PLATELET - Abnormal; Notable for the following:    Platelets 145 (*)    All other components within normal limits  URINE MICROSCOPIC-ADD ON - Abnormal; Notable for the following:    Squamous Epithelial / LPF 0-5 (*)    Bacteria, UA RARE (*)    All other components within normal limits    EKG  EKG Interpretation None       Radiology Dg Lumbar  Spine Complete  Result Date: 09/27/2016 CLINICAL DATA:  Chronic low back pain 12/08/2014 EXAM: LUMBAR SPINE - COMPLETE 4+ VIEW COMPARISON:  12/08/2014 and 08/16/2010 FINDINGS: Five views of the lumbar spine submitted. No acute fracture or subluxation. Mild anterior spurring upper endplate of L4. Mild disc space flattening at L4-L5 level. Moderate disc space flattening with anterior spurring at L5-S1 level. Stable compression deformity upper endplate of Z30 vertebral body. IMPRESSION: No acute fracture or subluxation. Degenerative changes as described above. Stable compression deformity T12 vertebral body. Electronically Signed   By: Lahoma Crocker M.D.   On: 09/27/2016 12:40   Dg Hip Unilat W Or Wo Pelvis 2-3 Views Right  Result Date: 09/27/2016 CLINICAL DATA:  Fall 3 weeks ago with low back pain and right hip pain, initial encounter EXAM: DG HIP (WITH OR WITHOUT PELVIS) 2-3V RIGHT COMPARISON:  None. FINDINGS: The pelvic ring is intact. Left external iliac stent is noted. No acute fracture or dislocation is seen. No soft tissue abnormality is noted. Vascular calcifications are seen. IMPRESSION: No acute abnormality noted. Electronically Signed   By: Inez Catalina M.D.   On: 09/27/2016 12:39    Procedures Procedures (including critical care time)  Medications Ordered in ED Medications - No data to display   Initial Impression / Assessment and Plan / ED Course  I have reviewed the triage vital signs and the nursing notes.  Pertinent labs & imaging results that were available during my care of the patient were reviewed by me and considered in my medical decision making (see chart for details).  Clinical Course   Pt well appearing.  vitals stable.  Ambulates with steady gait.  No focal neuro deficits.  No concerning sx's for emergent neurological process. Likely musculoskeletal, although possible disc injury also considered and discussed with pt.    Pt also seen by Dr. Lita Mains and he appears stable  for d/c home with NSAID and muscle relaxers.    On recheck of vitals, no bradycardia.  Likely related to BP medications.   Final Clinical Impressions(s) / ED Diagnoses   Final diagnoses:  Strain of lumbar region, initial encounter    New Prescriptions New Prescriptions   No  medications on file     Kem Parkinson, Hershal Coria 09/30/16 0042    Julianne Rice, MD 09/30/16 2348

## 2016-09-27 NOTE — Discharge Instructions (Signed)
Continue to alternate ice and heat to your back.  Follow-up with your doctor if needed

## 2016-11-15 IMAGING — CT CT BIOPSY
1 of 2 series · 8 of 32 positions shown, 13 images · non-contrast
Comparison: none

CLINICAL DATA: 82-year-old with a suspicious pulmonary nodule in
the superior segment of the left lower lobe. Tissue diagnosis is
needed.

[Series 2: i-spiral 5.0 b40f · axial · 0.78mm/px · z∈[-132,-58]mm · 8 of 28 slices shown, 13 images]
[im 4/28  soft-tissue]
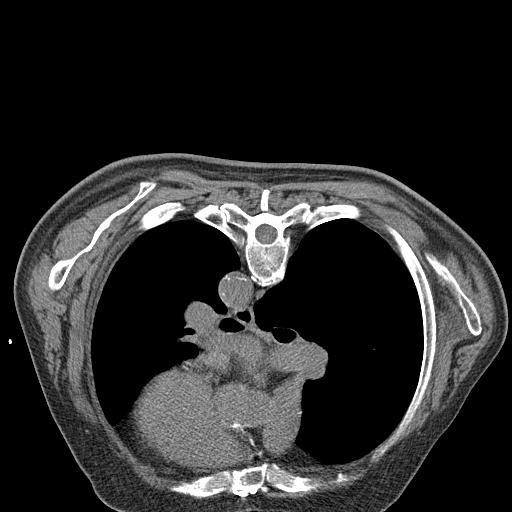
[im 4/28  bone]
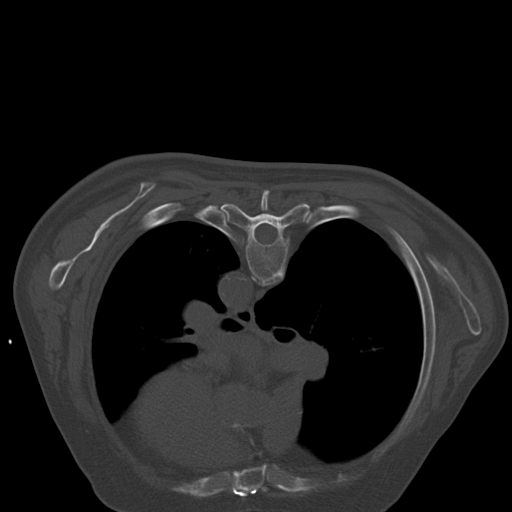
[im 7/28  soft-tissue]
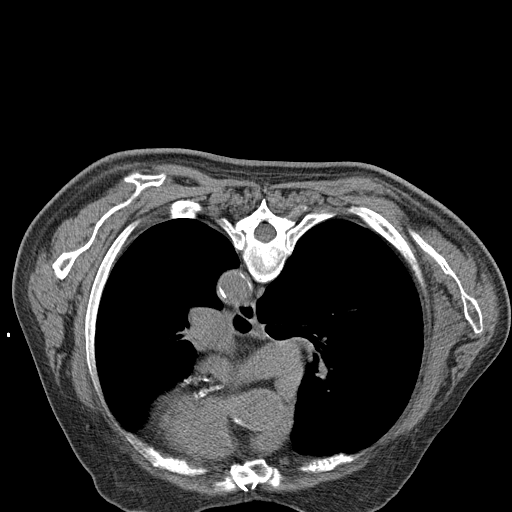
[im 10/28  soft-tissue]
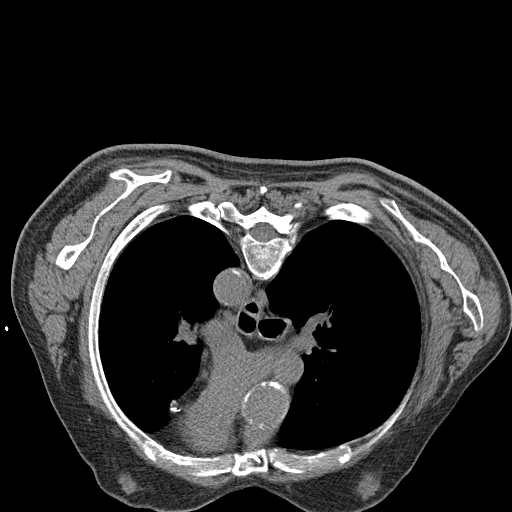
[im 13/28  soft-tissue]
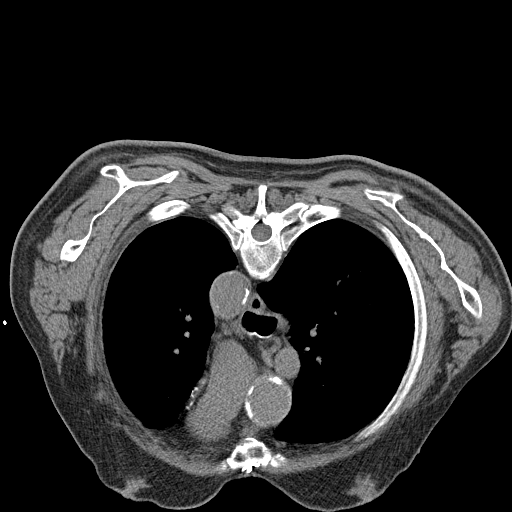
[im 16/28  soft-tissue]
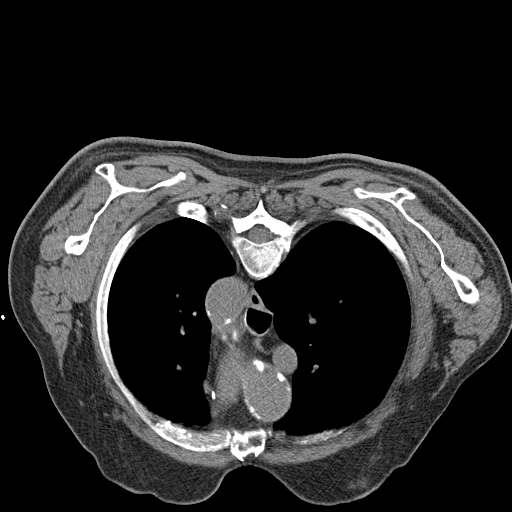
[im 16/28  lung]
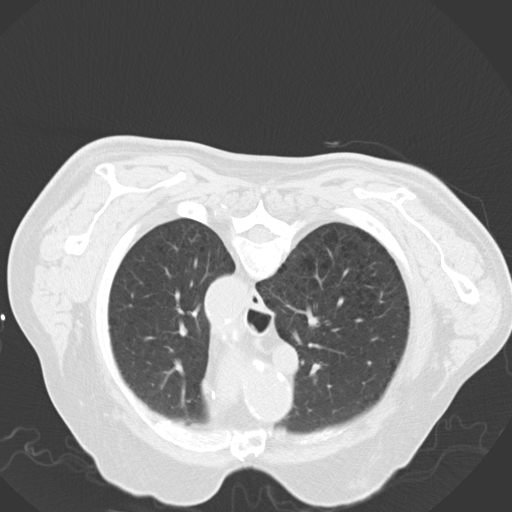
[im 19/28  soft-tissue]
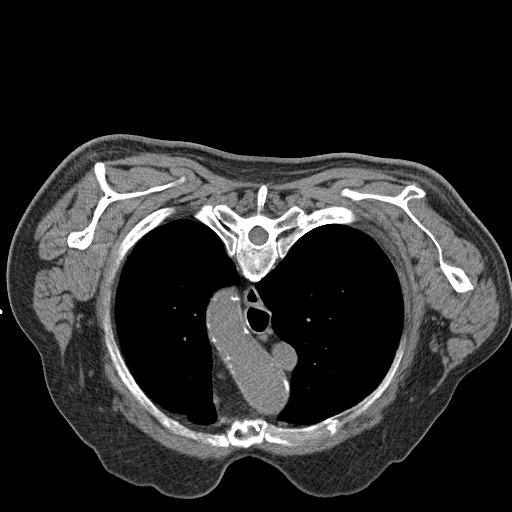
[im 19/28  lung]
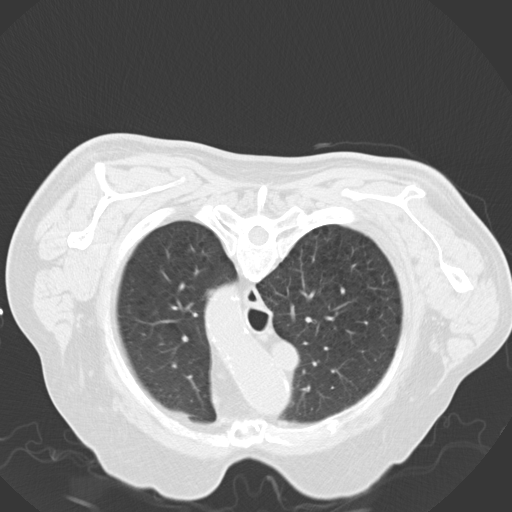
[im 22/28  soft-tissue]
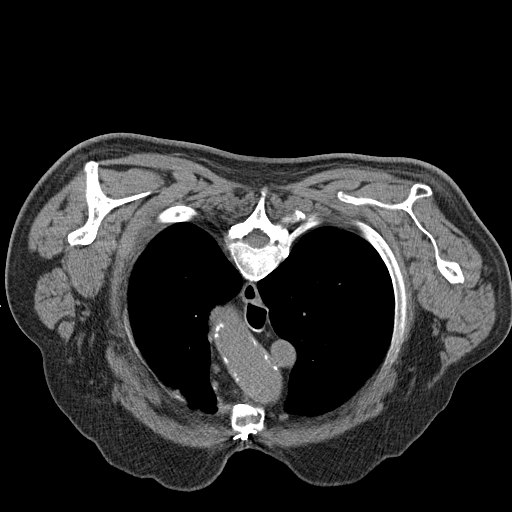
[im 22/28  lung]
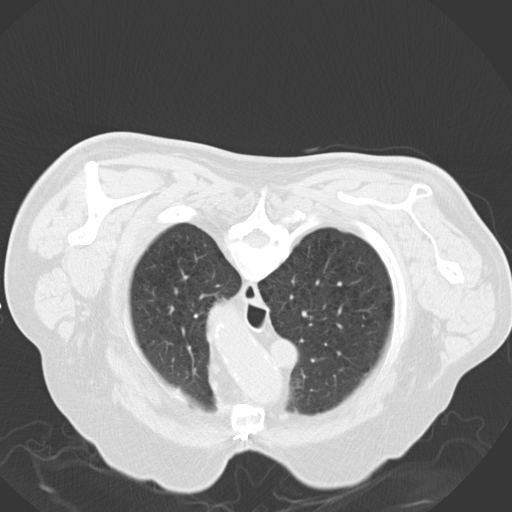
[im 25/28  soft-tissue]
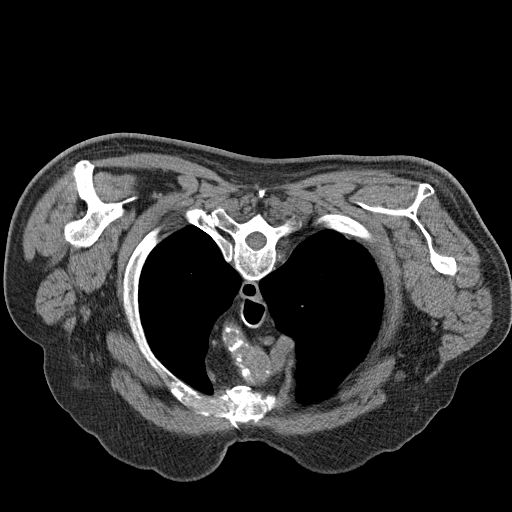
[im 25/28  lung]
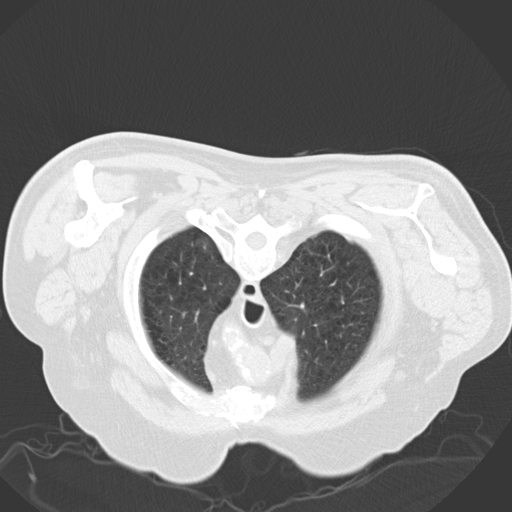

[8 of 32 positions shown; findings below may reference images not displayed]

EXAM:
CT GUIDED BIOPSY OF LEFT LOWER LOBE NODULE

MEDICATIONS:
2 mg versed, 50 mcg fentanyl. A radiology nurse monitored the
patient for moderate sedation.

ANESTHESIA/SEDATION:
Sedation time: 24 min

PROCEDURE:
The procedure was explained to the patient. The risks and benefits
of the procedure were discussed and the patient's questions were
addressed. Specifically, the risks of bleeding, pneumothorax and
even death were discussed with the patient. In addition, I explained
to the patient that a non-diagnostic biopsy was possible due to the
small size of this lesion. Informed consent was obtained from the
patient. The patient was placed prone on CT scan. CT images through
the chest were obtained. The small nodule in the superior segment of
the left lower lobe was identified. The left side of the back was
marked. The skin was prepped and draped in sterile fashion. 1%
lidocaine was used for local anesthetic. A 17 gauge coaxial needle
was directed into the small left lower lobe nodule with CT guidance.
Needle was placed along the posterior aspect of the lesion. Two core
biopsies were obtained with an 18 gauge core device. The tract was
plugged with the

[employer] Tract Sealant. 17 gauge needle was removed without
complication. Follow up CT images were obtained. Bandage placed over
the puncture site.

Patient recovered in [REDACTED]. Follow-up chest radiograph was
negative for a pneumothorax. Patient was discharged home.
FINDINGS: Emphysematous changes in the lungs. There is a 8 mm nodule in the
superior segment of the left lower lobe. Needle position confirmed
along the posterior aspect of the lesion. Small amount of bleeding
following the core biopsies. Minimal pleural air present after the
core biopsies.

COMPLICATIONS:
None
IMPRESSION: CT-guided core biopsies of the nodule in the left lower lobe
superior segment.

## 2016-11-29 IMAGING — CT CT HEAD WO/W CM
1 of 2 series · 13 of 30 positions shown, 17 images · IV contrast (Omnipaque 300)
Comparison: 08/06/2013 head CT

CLINICAL DATA: Lung cancer.  Evaluate for metastases.

EXAM:
CT HEAD WITHOUT AND WITH CONTRAST
TECHNIQUE: Contiguous axial images were obtained from the base of the skull
through the vertex without and with intravenous contrast
CONTRAST:  80mL OMNIPAQUE IOHEXOL 300 MG/ML  SOLN

[Series 2: head w/o 4.8 h37s · axial · non-contrast · 0.45mm/px · z∈[+116,+266]mm · 13 of 36 slices shown, 17 images]
[im 3/36  brain]
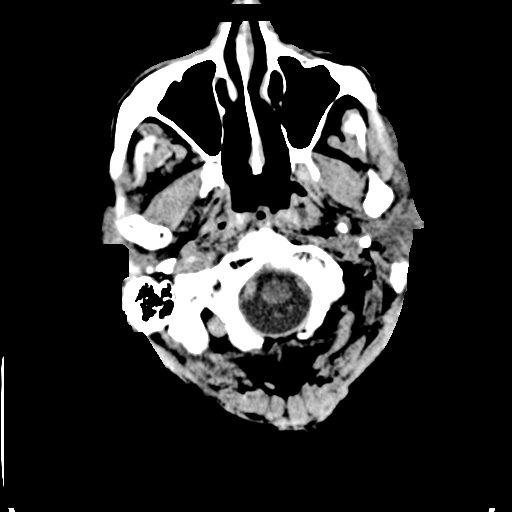
[im 3/36  bone]
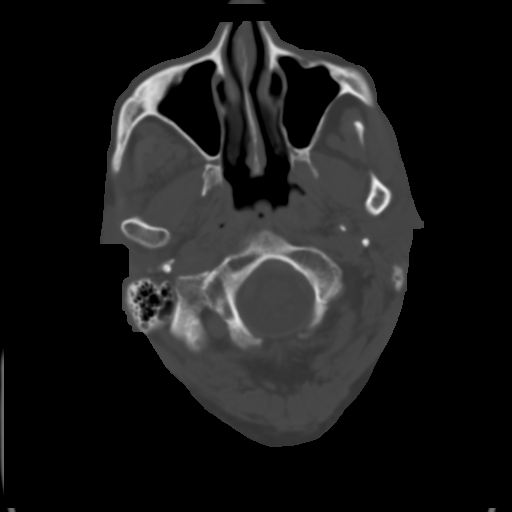
[im 6/36  brain]
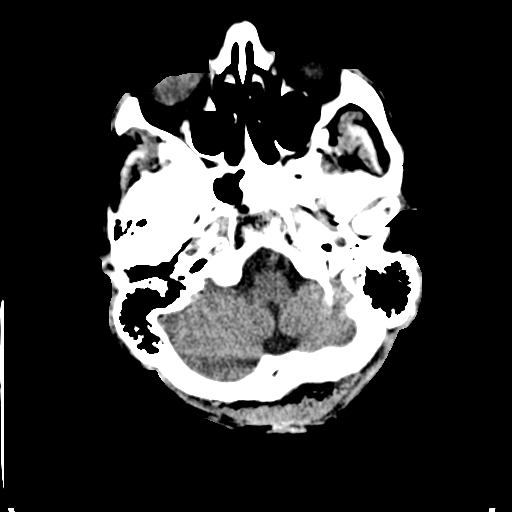
[im 8/36  brain]
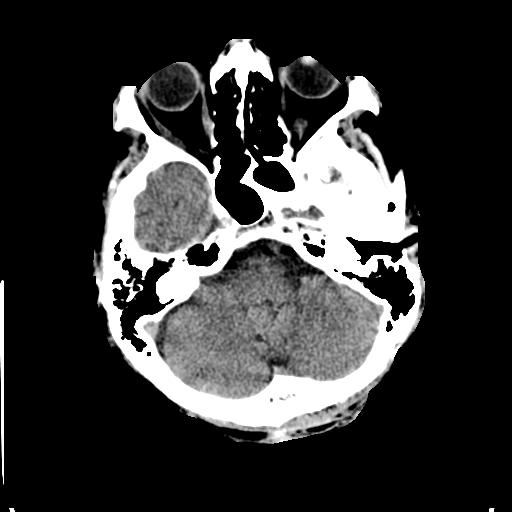
[im 11/36  brain]
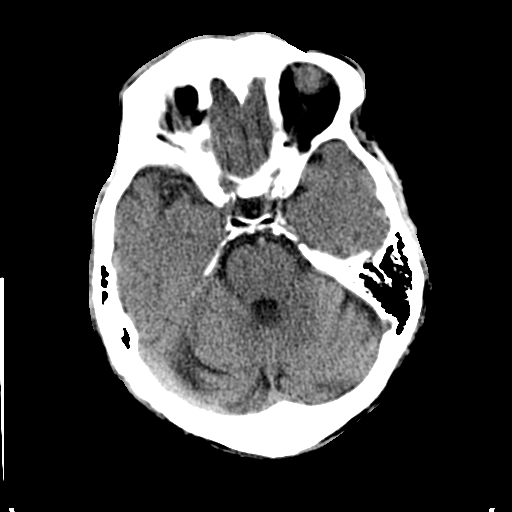
[im 13/36  brain]
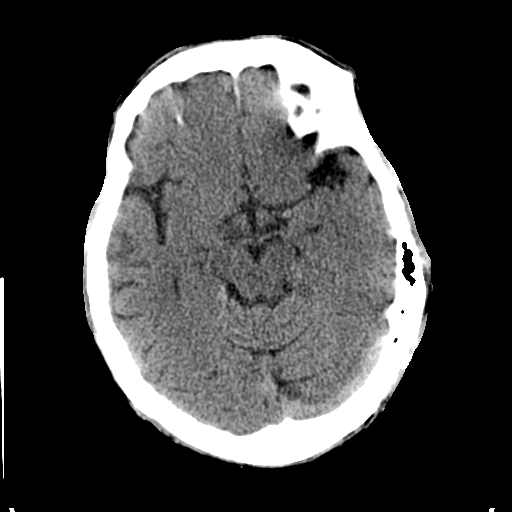
[im 13/36  bone]
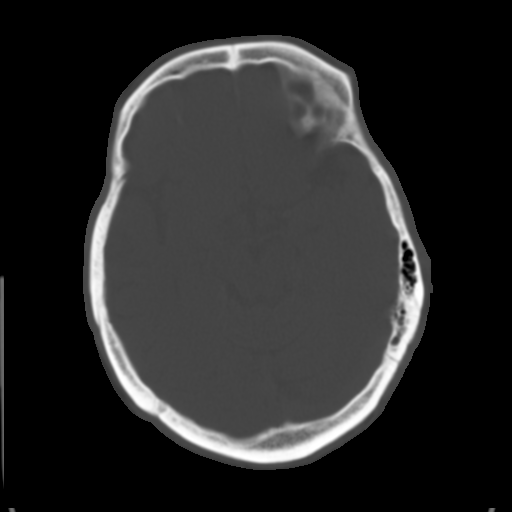
[im 16/36  brain]
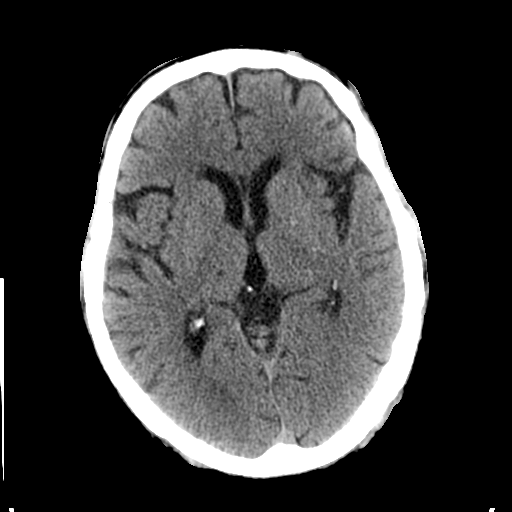
[im 18/36  brain]
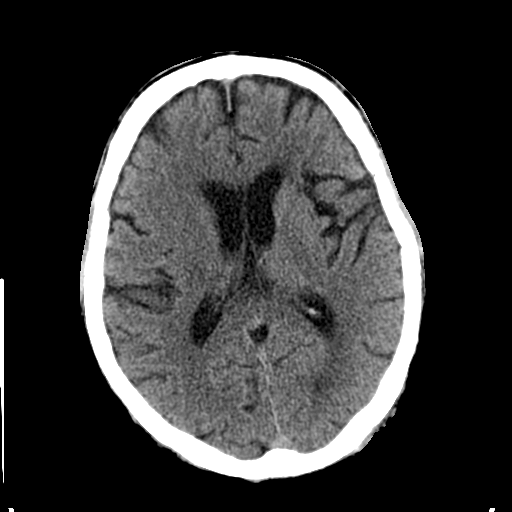
[im 21/36  brain]
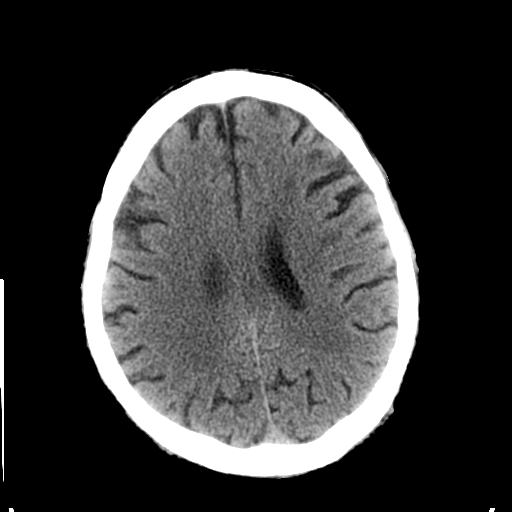
[im 23/36  brain]
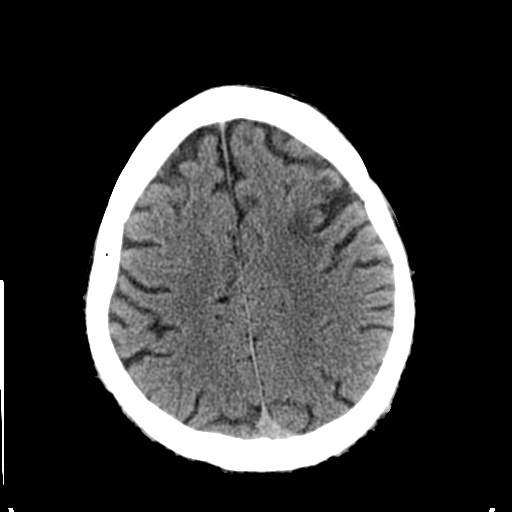
[im 23/36  bone]
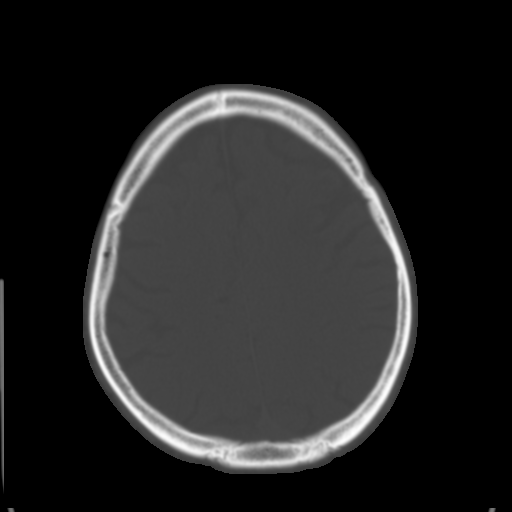
[im 26/36  brain]
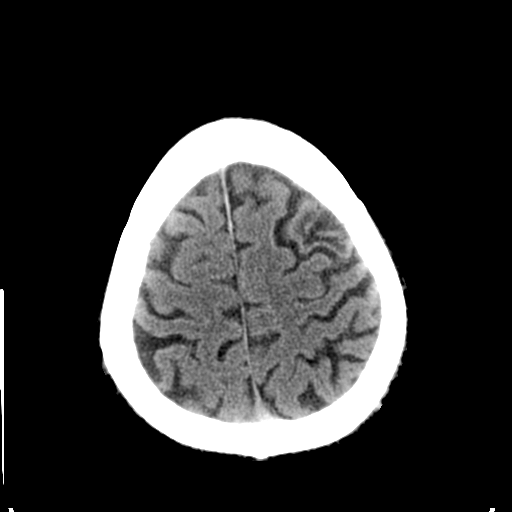
[im 28/36  brain]
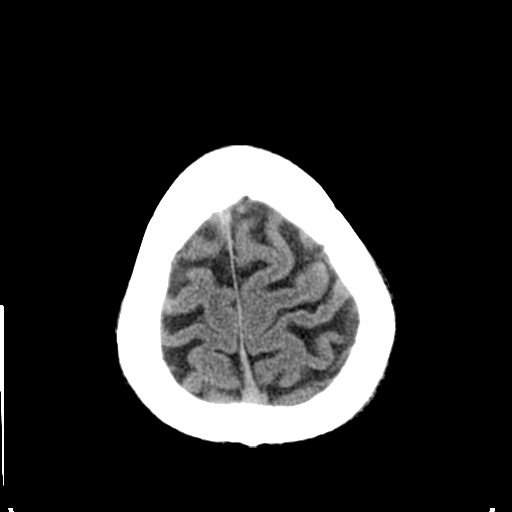
[im 31/36  brain]
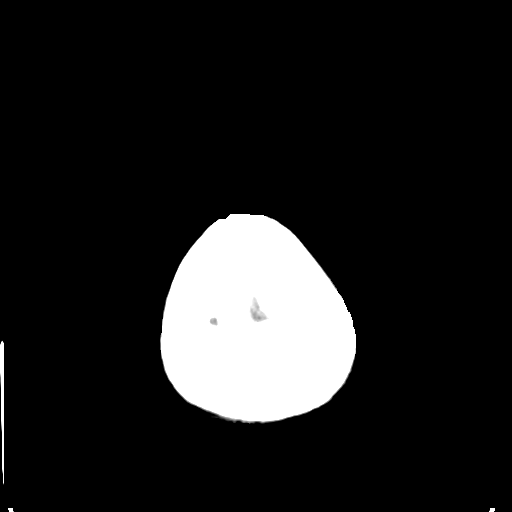
[im 33/36  brain]
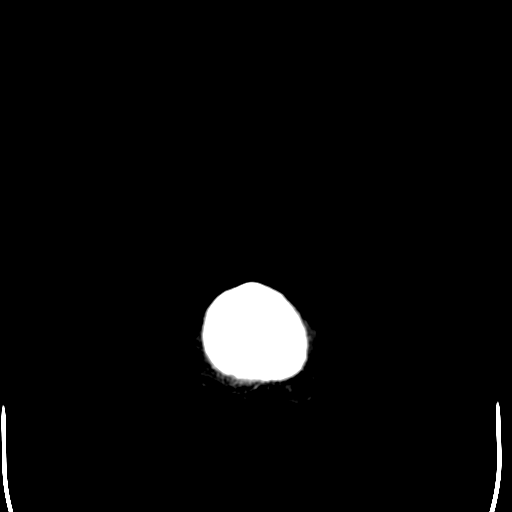
[im 33/36  bone]
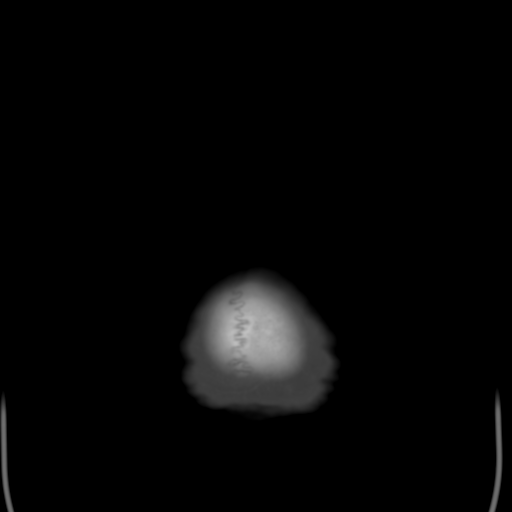

[13 of 30 positions shown; findings below may reference images not displayed]

FINDINGS: Skull and Sinuses:No evidence of bone metastasis.

Chronic left sphenoid sinusitis with wall thickening and mild
retention of secretions.

Orbits: Negative.

Brain: No abnormal intracranial enhancement to suggest metastasis.
No evidence of acute infarction, hemorrhage, hydrocephalus, or
shift. Stable appearance of a small, remote high left frontal
cortical infarct.
IMPRESSION: 1. No evidence of brain metastasis.
2. Small, remote left frontal infarct.

## 2016-12-24 ENCOUNTER — Ambulatory Visit (INDEPENDENT_AMBULATORY_CARE_PROVIDER_SITE_OTHER): Payer: Medicare Other | Admitting: Cardiology

## 2016-12-24 ENCOUNTER — Encounter: Payer: Self-pay | Admitting: Cardiology

## 2016-12-24 VITALS — BP 157/81 | HR 58 | Ht 71.0 in | Wt 190.0 lb

## 2016-12-24 DIAGNOSIS — I6523 Occlusion and stenosis of bilateral carotid arteries: Secondary | ICD-10-CM | POA: Diagnosis not present

## 2016-12-24 DIAGNOSIS — I10 Essential (primary) hypertension: Secondary | ICD-10-CM

## 2016-12-24 DIAGNOSIS — I5022 Chronic systolic (congestive) heart failure: Secondary | ICD-10-CM

## 2016-12-24 DIAGNOSIS — I2510 Atherosclerotic heart disease of native coronary artery without angina pectoris: Secondary | ICD-10-CM | POA: Diagnosis not present

## 2016-12-24 MED ORDER — EZETIMIBE 10 MG PO TABS: 10.0000 mg | ORAL_TABLET | Freq: Every day | ORAL | 3 refills | Status: DC

## 2016-12-24 NOTE — Progress Notes (Signed)
Clinical Summary Mr. Cupp is a 83 y.o.male seen today for follow up of the following medical problems.    1. CAD/ICM  - history of prior CABG 2003 in [place]  - 09/2013 admission to [facility] with decompensated heart failure. Workup included an echo which showed an LVEF of 15%. He was transferred to [facility] for further management.  - cath 09/2013 showed LM 30%, LAD 100% proximal, LCX 50% ostial, RCA 100% mid with distal vessel filling with right to right and left to right collaterals. LIMA-LAD patent, graft from [place] to OM1, OM2, PDA occluded. No PCI targets, recommendations for medical management. RHC mean PA 43, wedge 22, CI 1.74.  - repeat echo 04/2014 shows LVEF improved to 40-45%.    - no SOB or DOE. No recent edema. No recent chest pain.   2. Hyperlipidemia  - compliant with zetia  - lipitor and crestor have caused prior muscle aches, not on statin  - 06/2016 TC 186 TG 126 HDL 37 LDL 124  3. Lung CA - has completed radiation treatment.  - followed by Dr Dwana Curd  4. PAD - followed by vascular, previous stenting to left external iliac and right common iliac.  - s/p right femoral to below-knee popliteal bypass with vein 10/23/2015.  - no recent claudication symptoms.    5. Carotid stenosis - repeat US 01/2015 with only mild bilateral disease.  - denies any recent neuro symptoms     Past Medical History:  Diagnosis Date  . Cancer ([place])    L lung  . Coronary artery disease   . Hypercholesteremia   . Hypertension   . Hypothyroidism   . PAD (peripheral artery disease) ([place])   . Pneumonia 09/2013  . Radiation april 2016  . Solitary pulmonary nodule on lung CT 12/13/13   11/15/14  . Stroke [facility]) 11/2008     No Known Allergies   Current Outpatient Prescriptions  Medication Sig Dispense Refill  . aspirin 81 MG EC tablet Take 81 mg by mouth daily.    . carvedilol (COREG) 25 MG tablet Take 0.5 tablets (12.5 mg total) by mouth 2 (two)  times daily with a meal. 90 tablet 3  . ezetimibe (ZETIA) 10 MG tablet Take 1 tablet (10 mg total) by mouth daily. 30 tablet 11  . lisinopril (PRINIVIL,ZESTRIL) 5 MG tablet Take 1 tablet (5 mg total) by mouth daily. 90 tablet 3  . naproxen (NAPROSYN) 500 MG tablet Take 1 tablet (500 mg total) by mouth 2 (two) times daily. Take with food 14 tablet 0  . potassium chloride (K-DUR,KLOR-CON) 10 MEQ tablet Take 1 tablet (10 mEq total) by mouth daily. 30 tablet 11   No current facility-administered medications for this visit.      Past Surgical History:  Procedure Laterality Date  . Angioplasty to Left femoral artery    . CARPAL TUNNEL RELEASE Left 05/11/2014   Procedure: LEFT CARPAL TUNNEL RELEASE;  Surgeon: Carole Civil, MD;  Location: [facility];  Service: Orthopedics;  Laterality: Left;  . COLONOSCOPY N/A 01/24/2015   Procedure: COLONOSCOPY;  Surgeon: Danie Binder, MD;  Location: [facility];  Service: Endoscopy;  Laterality: N/A;  130  . CORONARY ARTERY BYPASS GRAFT     x4  . FEMORAL-POPLITEAL BYPASS GRAFT Right 10/23/2015   Procedure: BYPASS GRAFT FEMORAL TO BELOW KNEE POPLITEAL ARTERY USING RIGHT NON-REVERSED GREATER SAPPHENOUS VEIN;  Surgeon: [name], MD;  Location: [place];  Service: Vascular;  Laterality:  Right;  Marland Kitchen INTRAOPERATIVE ARTERIOGRAM Right 10/23/2015   Procedure: INTRA OPERATIVE ARTERIOGRAM - RIGHT LOWER LEG;  Surgeon: [name], MD;  Location: [place];  Service: Vascular;  Laterality: Right;  . KNEE ARTHROSCOPY Right   . LEFT AND RIGHT HEART CATHETERIZATION WITH CORONARY ANGIOGRAM N/A 09/29/2013   Procedure: LEFT AND RIGHT HEART CATHETERIZATION WITH CORONARY ANGIOGRAM;  Surgeon: Burnell Blanks, MD;  Location: [facility] CATH LAB;  Service: Cardiovascular;  Laterality: N/A;  . PERIPHERAL VASCULAR CATHETERIZATION N/A 04/21/2015   Procedure: Abdominal Aortogram w/Lower Extremity;  Surgeon: [name], MD;  Location: [place] CV LAB;  Service: Cardiovascular;   Laterality: N/A;  . Stent to right femoral artery    . TRIGGER FINGER RELEASE Bilateral   . VEIN HARVEST Right 10/23/2015   Procedure: [place];  Surgeon: [name], MD;  Location: [facility] OR;  Service: Vascular;  Laterality: Right;     No Known Allergies    Family History  Problem Relation Age of Onset  . Heart attack Father   . Hyperlipidemia Father   . Diabetes Mother   . Diabetes Brother   . Colon cancer Neg Hx      Social History Mr. Molden reports that he has been smoking Cigars.  He started smoking about 67 years ago. He has a 60.00 pack-year smoking history. He has never used smokeless tobacco. Mr. Harriman reports that he does not drink alcohol.   Review of Systems CONSTITUTIONAL: No weight loss, fever, chills, weakness or fatigue.  HEENT: Eyes: No visual loss, blurred vision, double vision or yellow sclerae.No hearing loss, sneezing, congestion, runny nose or sore throat.  SKIN: No rash or itching.  CARDIOVASCULAR: per hpi RESPIRATORY: No shortness of breath, cough or sputum.  GASTROINTESTINAL: No anorexia, nausea, vomiting or diarrhea. No abdominal pain or blood.  GENITOURINARY: No burning on urination, no polyuria NEUROLOGICAL: No headache, dizziness, syncope, paralysis, ataxia, numbness or tingling in the extremities. No change in bowel or bladder control.  MUSCULOSKELETAL: No muscle, back pain, joint pain or stiffness.  LYMPHATICS: No enlarged nodes. No history of splenectomy.  PSYCHIATRIC: No history of depression or anxiety.  ENDOCRINOLOGIC: No reports of sweating, cold or heat intolerance. No polyuria or polydipsia.  Marland Kitchen   Physical Examination Vitals:   12/24/16 0901  BP: (!) 157/81  Pulse: (!) 58   Vitals:   12/24/16 0901  Weight: 190 lb (86.2 kg)  Height: '5\' 11"'$  (1.803 m)    Gen: resting comfortably, no acute distress HEENT: no scleral icterus, pupils equal round and reactive, no palptable cervical adenopathy,    CV: RRR, no m/r/g, no jvd Resp: Clear to auscultation bilaterally GI: abdomen is soft, non-tender, non-distended, normal bowel sounds, no hepatosplenomegaly MSK: extremities are warm, no edema.  Skin: warm, no rash Neuro:  no focal deficits Psych: appropriate affect   Diagnostic Studies 09/29/13 Cath Hemodynamic Findings: Ao: 162/70  LV: 163/19/29  RA: 12 RV: 66/7/16  PA: 67/26 (mean 43)  PCWP: 22  Fick Cardiac Output: 3.5 L/min  Fick Cardiac Index: 1.74 L/min/m2  Central Aortic Saturation: 91%  Pulmonary Artery Saturation: 50%  Angiographic Findings:  Left main: Distal 30% stenosis.  Left Anterior Descending Artery: 100% proximal occlusion. Proximal, mid and distal vessel fills from the patent IMA graft.  Circumflex Artery: Large caliber vessel with large intermediate Makai Agostinelli. Diffuse non-obstructive plaque in the large bifurcating intermediate Amario Longmore. The ostium of the Circumflex has 50% stenosis.  Right Coronary Artery: Large dominant vessel  with 100% mid occlusion. The distal vessel fills right to right bridging collaterals and left to right collaterals.  Graft Anatomy:  LIMA to LAD is patent  Free Radial (? Y graft from LIMA) to OM1, OM2, PDA is occluded.  Left Ventricular Angiogram: Deferred.  Impression:  1. Triple vessel CAD with patent LIMA to LAD, occluded RCA with good collateral filling, moderate disease in Circumflex  2. No focal targets for PCI  3. Ischemic cardiomyopathy with acute systolic CHF   51/7/00 Echo LVEF 15-20%, moderate LVH, severe diffuse hypokinesis, restrictive diastolic dysfunction, multiple WMAs, moderate MR, severe RV dysfunction RV TAPSE 0.7 cm, PASP 61 mmHg.   12/2013 Lab TC 175 TG 111 LDL 116 HDL 37  TSH 7.7 HgbA1c 6.6 Na 143 K 4.5 Cr 1.22 GFR56   04/2014 Echo Study Conclusions  - Left ventricle: The cavity size was normal. Wall thickness was increased in a pattern of mild LVH. Systolic function was  mildly to moderately reduced. The estimated ejection fraction was in the range of 40% to 45%. There is diastolic dysfunction, indeterminant grade. - Regional wall motion abnormality: Hypokinesis of the mid anterior and basal-mid anteroseptal myocardium. - Aortic valve: Moderately calcified annulus. Trileaflet; moderately thickened leaflets. Valve area (VTI): 2.48 cm^2. Valve area (Vmax): 2.23 cm^2. - Mitral valve: Moderately calcified annulus. Mildly thickened leaflets . - Left atrium: The atrium was moderately dilated. - Right atrium: The atrium was mildly dilated. - Technically adequate study.   01/2015 Carotid US bilaterall 1-39%      Assessment and Plan   1. CAD/ICM  - no recent symptoms - continue current meds  2. Hyperlipidemia  - intolerant to statins,  continue zetia.   3. PAD - he will continue to follow with vascular   4. Carotid stenosis - mild bilateral disease by [place] 01/2015 - no neuro symptoms - continue to monitor  5. HTN - elevated in clinic. He has had previous troubles with low bp's and orthostatic dizziness. No med changes at thiis time, conitnue to monitor.    F/u 6 months.     Arnoldo Lenis, M.D.

## 2016-12-24 NOTE — Patient Instructions (Signed)

## 2017-01-20 DIAGNOSIS — I10 Essential (primary) hypertension: Secondary | ICD-10-CM | POA: Diagnosis not present

## 2017-01-20 DIAGNOSIS — I2510 Atherosclerotic heart disease of native coronary artery without angina pectoris: Secondary | ICD-10-CM | POA: Diagnosis not present

## 2017-01-20 DIAGNOSIS — F17210 Nicotine dependence, cigarettes, uncomplicated: Secondary | ICD-10-CM | POA: Diagnosis not present

## 2017-01-20 DIAGNOSIS — M545 Low back pain: Secondary | ICD-10-CM | POA: Diagnosis not present

## 2017-01-20 DIAGNOSIS — Z299 Encounter for prophylactic measures, unspecified: Secondary | ICD-10-CM | POA: Diagnosis not present

## 2017-01-20 DIAGNOSIS — I639 Cerebral infarction, unspecified: Secondary | ICD-10-CM | POA: Diagnosis not present

## 2017-01-20 DIAGNOSIS — Z6838 Body mass index (BMI) 38.0-38.9, adult: Secondary | ICD-10-CM | POA: Diagnosis not present

## 2017-01-20 DIAGNOSIS — Z713 Dietary counseling and surveillance: Secondary | ICD-10-CM | POA: Diagnosis not present

## 2017-01-20 DIAGNOSIS — E7800 Pure hypercholesterolemia, unspecified: Secondary | ICD-10-CM | POA: Diagnosis not present

## 2017-01-28 ENCOUNTER — Other Ambulatory Visit: Payer: Self-pay | Admitting: Cardiology

## 2017-01-28 NOTE — Telephone Encounter (Signed)
Refill:   carvedilol (COREG) 25 MG tablet   [facility]

## 2017-01-29 MED ORDER — CARVEDILOL 25 MG PO TABS: 12.5000 mg | ORAL_TABLET | Freq: Two times a day (BID) | ORAL | 3 refills | Status: DC

## 2017-01-29 NOTE — Telephone Encounter (Signed)
Medication sent to pharmacy  

## 2017-02-21 ENCOUNTER — Other Ambulatory Visit: Payer: Self-pay | Admitting: Cardiology

## 2017-03-12 ENCOUNTER — Encounter: Payer: Self-pay | Admitting: Cardiology

## 2017-03-20 DIAGNOSIS — Z6837 Body mass index (BMI) 37.0-37.9, adult: Secondary | ICD-10-CM | POA: Diagnosis not present

## 2017-03-20 DIAGNOSIS — F17210 Nicotine dependence, cigarettes, uncomplicated: Secondary | ICD-10-CM | POA: Diagnosis not present

## 2017-03-20 DIAGNOSIS — Z299 Encounter for prophylactic measures, unspecified: Secondary | ICD-10-CM | POA: Diagnosis not present

## 2017-03-20 DIAGNOSIS — I10 Essential (primary) hypertension: Secondary | ICD-10-CM | POA: Diagnosis not present

## 2017-03-20 DIAGNOSIS — I2510 Atherosclerotic heart disease of native coronary artery without angina pectoris: Secondary | ICD-10-CM | POA: Diagnosis not present

## 2017-03-26 DIAGNOSIS — H40013 Open angle with borderline findings, low risk, bilateral: Secondary | ICD-10-CM | POA: Diagnosis not present

## 2017-03-26 DIAGNOSIS — H43823 Vitreomacular adhesion, bilateral: Secondary | ICD-10-CM | POA: Diagnosis not present

## 2017-03-26 DIAGNOSIS — H2511 Age-related nuclear cataract, right eye: Secondary | ICD-10-CM | POA: Diagnosis not present

## 2017-03-26 DIAGNOSIS — H2513 Age-related nuclear cataract, bilateral: Secondary | ICD-10-CM | POA: Diagnosis not present

## 2017-03-26 DIAGNOSIS — H25013 Cortical age-related cataract, bilateral: Secondary | ICD-10-CM | POA: Diagnosis not present

## 2017-03-26 DIAGNOSIS — H25011 Cortical age-related cataract, right eye: Secondary | ICD-10-CM | POA: Diagnosis not present

## 2017-03-26 DIAGNOSIS — H35033 Hypertensive retinopathy, bilateral: Secondary | ICD-10-CM | POA: Diagnosis not present

## 2017-04-04 ENCOUNTER — Encounter (INDEPENDENT_AMBULATORY_CARE_PROVIDER_SITE_OTHER): Payer: Medicare Other | Admitting: Ophthalmology

## 2017-04-04 DIAGNOSIS — H43813 Vitreous degeneration, bilateral: Secondary | ICD-10-CM | POA: Diagnosis not present

## 2017-04-04 DIAGNOSIS — H2513 Age-related nuclear cataract, bilateral: Secondary | ICD-10-CM | POA: Diagnosis not present

## 2017-04-04 DIAGNOSIS — H35033 Hypertensive retinopathy, bilateral: Secondary | ICD-10-CM | POA: Diagnosis not present

## 2017-04-04 DIAGNOSIS — I10 Essential (primary) hypertension: Secondary | ICD-10-CM | POA: Diagnosis not present

## 2017-04-04 DIAGNOSIS — H43823 Vitreomacular adhesion, bilateral: Secondary | ICD-10-CM | POA: Diagnosis not present

## 2017-04-29 DIAGNOSIS — H25031 Anterior subcapsular polar age-related cataract, right eye: Secondary | ICD-10-CM | POA: Diagnosis not present

## 2017-04-29 DIAGNOSIS — H2511 Age-related nuclear cataract, right eye: Secondary | ICD-10-CM | POA: Diagnosis not present

## 2017-04-29 DIAGNOSIS — H25011 Cortical age-related cataract, right eye: Secondary | ICD-10-CM | POA: Diagnosis not present

## 2017-05-22 DIAGNOSIS — H25012 Cortical age-related cataract, left eye: Secondary | ICD-10-CM | POA: Diagnosis not present

## 2017-05-22 DIAGNOSIS — H2512 Age-related nuclear cataract, left eye: Secondary | ICD-10-CM | POA: Diagnosis not present

## 2017-06-03 DIAGNOSIS — H2512 Age-related nuclear cataract, left eye: Secondary | ICD-10-CM | POA: Diagnosis not present

## 2017-06-03 DIAGNOSIS — H25812 Combined forms of age-related cataract, left eye: Secondary | ICD-10-CM | POA: Diagnosis not present

## 2017-06-11 DIAGNOSIS — H2512 Age-related nuclear cataract, left eye: Secondary | ICD-10-CM | POA: Diagnosis not present

## 2017-06-19 ENCOUNTER — Encounter: Payer: Self-pay | Admitting: Cardiology

## 2017-06-19 ENCOUNTER — Ambulatory Visit (INDEPENDENT_AMBULATORY_CARE_PROVIDER_SITE_OTHER): Payer: Medicare Other | Admitting: Cardiology

## 2017-06-19 VITALS — BP 196/71 | HR 57 | Ht 71.5 in | Wt 190.0 lb

## 2017-06-19 DIAGNOSIS — I255 Ischemic cardiomyopathy: Secondary | ICD-10-CM | POA: Diagnosis not present

## 2017-06-19 DIAGNOSIS — R7309 Other abnormal glucose: Secondary | ICD-10-CM | POA: Diagnosis not present

## 2017-06-19 DIAGNOSIS — I2510 Atherosclerotic heart disease of native coronary artery without angina pectoris: Secondary | ICD-10-CM

## 2017-06-19 DIAGNOSIS — I10 Essential (primary) hypertension: Secondary | ICD-10-CM

## 2017-06-19 DIAGNOSIS — I739 Peripheral vascular disease, unspecified: Secondary | ICD-10-CM

## 2017-06-19 DIAGNOSIS — I6523 Occlusion and stenosis of bilateral carotid arteries: Secondary | ICD-10-CM

## 2017-06-19 MED ORDER — LISINOPRIL 10 MG PO TABS: 10.0000 mg | ORAL_TABLET | Freq: Every day | ORAL | 1 refills | Status: DC

## 2017-06-19 NOTE — Patient Instructions (Signed)
Your physician wants you to follow-up in: [place] will receive a reminder letter in the mail two months in advance. If you don't receive a letter, please call our office to schedule the follow-up appointment.  Your physician has recommended you make the following change in your medication:   INCREASE LISINOPRIL 10 MG DAILY  Your physician recommends that you return for lab work in: [place] TO LAB WORK - CBC/LIPIDS/HGBA1C/TSH/MG/BMP  Your physician has requested that you regularly monitor and record your blood pressure readings at home FOR 2 WEEKS AND CALL us WITH READINGS. Please use the same machine at the same time of day to check your readings and record them to bring to your follow-up visit.  Thank you for choosing [facility]!!

## 2017-06-19 NOTE — Progress Notes (Signed)
Clinical Summary Gary Odonnell is a 83 y.o.male seen today for follow up of the following medical problems.    1. CAD/ICM  - history of prior CABG 2003 in [place]  - 09/2013 admission to [facility] with decompensated heart failure. Workup included an echo which showed an LVEF of 15%. He was transferred to [facility] for further management.  - cath 09/2013 showed LM 30%, LAD 100% proximal, LCX 50% ostial, RCA 100% mid with distal vessel filling with right to right and left to right collaterals. LIMA-LAD patent, graft from [place] to OM1, OM2, PDA occluded. No PCI targets, recommendations for medical management. RHC mean PA 43, wedge 22, CI 1.74.  - repeat echo 04/2014 shows LVEF improved to 40-45%.     - denies and chest pain. No SOB/DOE - compliant with meds  2. Hyperlipidemia  - compliant with zetia  - lipitor and crestor have caused prior muscle aches, not on statin  - 06/2016 TC 186 TG 126 HDL 37 LDL 124  - has labs coming up with pcp  3. Lung CA - has completed radiation treatment.  - followed by Dr Dwana Curd  4. PAD - followed by vascular, previous stenting to left external iliac and right common iliac.  - s/p right femoral to below-knee popliteal bypass with vein 10/23/2015.  - no recent claudication symptoms.    5. Carotid stenosis - repeat US 01/2015 with only mild bilateral disease.  - no recent  recent neuro symptoms    6. HTN - he is compliant with medscompliant with meds  Past Medical History:  Diagnosis Date  . Cancer ([place])    L lung  . Coronary artery disease   . Hypercholesteremia   . Hypertension   . Hypothyroidism   . PAD (peripheral artery disease) ([place])   . Pneumonia 09/2013  . Radiation april 2016  . Solitary pulmonary nodule on lung CT 12/13/13   11/15/14  . Stroke [facility]) 11/2008     No Known Allergies   Current Outpatient Prescriptions  Medication Sig Dispense Refill  . aspirin 81 MG EC tablet Take 81 mg by mouth  daily.    . carvedilol (COREG) 25 MG tablet Take 0.5 tablets (12.5 mg total) by mouth 2 (two) times daily with a meal. 90 tablet 3  . ezetimibe (ZETIA) 10 MG tablet Take 1 tablet (10 mg total) by mouth daily. 90 tablet 3  . lisinopril (PRINIVIL,ZESTRIL) 5 MG tablet TAKE 1 TABLET DAILY (DOSE INCREASE) 90 tablet 3  . potassium chloride (K-DUR,KLOR-CON) 10 MEQ tablet Take 1 tablet (10 mEq total) by mouth daily. 30 tablet 11   No current facility-administered medications for this visit.      Past Surgical History:  Procedure Laterality Date  . Angioplasty to Left femoral artery    . CARPAL TUNNEL RELEASE Left 05/11/2014   Procedure: LEFT CARPAL TUNNEL RELEASE;  Surgeon: Carole Civil, MD;  Location: [facility];  Service: Orthopedics;  Laterality: Left;  . COLONOSCOPY N/A 01/24/2015   Procedure: COLONOSCOPY;  Surgeon: Danie Binder, MD;  [facility];  Service: Endoscopy;  Laterality: N/A;  130  . CORONARY ARTERY BYPASS GRAFT     x4  . FEMORAL-POPLITEAL BYPASS GRAFT Right 10/23/2015   Procedure: BYPASS GRAFT FEMORAL TO BELOW KNEE POPLITEAL ARTERY USING RIGHT NON-REVERSED GREATER SAPPHENOUS VEIN;  Surgeon: [name], MD;  Location: [place];  Service: Vascular;  Laterality: Right;  . INTRAOPERATIVE ARTERIOGRAM Right 10/23/2015   Procedure:  INTRA OPERATIVE ARTERIOGRAM - RIGHT LOWER LEG;  Surgeon: [name], MD;  Location: [place];  Service: Vascular;  Laterality: Right;  . KNEE ARTHROSCOPY Right   . LEFT AND RIGHT HEART CATHETERIZATION WITH CORONARY ANGIOGRAM N/A 09/29/2013   Procedure: LEFT AND RIGHT HEART CATHETERIZATION WITH CORONARY ANGIOGRAM;  Surgeon: Burnell Blanks, MD;  Location: [facility] CATH LAB;  Service: Cardiovascular;  Laterality: N/A;  . PERIPHERAL VASCULAR CATHETERIZATION N/A 04/21/2015   Procedure: Abdominal Aortogram w/Lower Extremity;  Surgeon: [name], MD;  Location: [place] CV LAB;  Service: Cardiovascular;  Laterality: N/A;  . Stent to right  femoral artery    . TRIGGER FINGER RELEASE Bilateral   . VEIN HARVEST Right 10/23/2015   Procedure: [place];  Surgeon: [name], MD;  Location: [facility] OR;  Service: Vascular;  Laterality: Right;     No Known Allergies    Family History  Problem Relation Age of Onset  . Heart attack Father   . Hyperlipidemia Father   . Diabetes Mother   . Diabetes Brother   . Colon cancer Neg Hx      Social History Gary Odonnell reports that he has been smoking Cigars.  He started smoking about 67 years ago. He has a 60.00 pack-year smoking history. He has never used smokeless tobacco. Gary Odonnell reports that he does not drink alcohol.   Review of Systems CONSTITUTIONAL: No weight loss, fever, chills, weakness or fatigue.  HEENT: Eyes: No visual loss, blurred vision, double vision or yellow sclerae.No hearing loss, sneezing, congestion, runny nose or sore throat.  SKIN: No rash or itching.  CARDIOVASCULAR: per hpi RESPIRATORY: pe rhpi GASTROINTESTINAL: No anorexia, nausea, vomiting or diarrhea. No abdominal pain or blood.  GENITOURINARY: No burning on urination, no polyuria NEUROLOGICAL: No headache, dizziness, syncope, paralysis, ataxia, numbness or tingling in the extremities. No change in bowel or bladder control.  MUSCULOSKELETAL: No muscle, back pain, joint pain or stiffness.  LYMPHATICS: No enlarged nodes. No history of splenectomy.  PSYCHIATRIC: No history of depression or anxiety.  ENDOCRINOLOGIC: No reports of sweating, cold or heat intolerance. No polyuria or polydipsia.  Marland Kitchen   Physical Examination Vitals:   06/19/17 1112  BP: (!) 196/71  Pulse: (!) 57   Vitals:   06/19/17 1112  Weight: 190 lb (86.2 kg)  Height: 5' 11.5" (1.816 m)    Gen: resting comfortably, no acute distress HEENT: no scleral icterus, pupils equal round and reactive, no palptable cervical adenopathy,  CV: RRR, no m/r/g, nojvd Resp: Clear to auscultation  bilaterally GI: abdomen is soft, non-tender, non-distended, normal bowel sounds, no hepatosplenomegaly MSK: extremities are warm, no edema.  Skin: warm, no rash Neuro:  no focal deficits Psych: appropriate affect   Diagnostic Studies 09/29/13 Cath Hemodynamic Findings: Ao: 162/70  LV: 163/19/29  RA: 12 RV: 66/7/16  PA: 67/26 (mean 43)  PCWP: 22  Fick Cardiac Output: 3.5 L/min  Fick Cardiac Index: 1.74 L/min/m2  Central Aortic Saturation: 91%  Pulmonary Artery Saturation: 50%  Angiographic Findings:  Left main: Distal 30% stenosis.  Left Anterior Descending Artery: 100% proximal occlusion. Proximal, mid and distal vessel fills from the patent IMA graft.  Circumflex Artery: Large caliber vessel with large intermediate Skylene Deremer. Diffuse non-obstructive plaque in the large bifurcating intermediate Jeff Frieden. The ostium of the Circumflex has 50% stenosis.  Right Coronary Artery: Large dominant vessel with 100% mid occlusion. The distal vessel fills right to right bridging collaterals and left to right collaterals.  Graft Anatomy:  LIMA to LAD is patent  Free Radial (? Y graft from LIMA) to OM1, OM2, PDA is occluded.  Left Ventricular Angiogram: Deferred.  Impression:  1. Triple vessel CAD with patent LIMA to LAD, occluded RCA with good collateral filling, moderate disease in Circumflex  2. No focal targets for PCI  3. Ischemic cardiomyopathy with acute systolic CHF   65/6/81 Echo LVEF 15-20%, moderate LVH, severe diffuse hypokinesis, restrictive diastolic dysfunction, multiple WMAs, moderate MR, severe RV dysfunction RV TAPSE 0.7 cm, PASP 61 mmHg.   12/2013 Lab TC 175 TG 111 LDL 116 HDL 37  TSH 7.7 HgbA1c 6.6 Na 143 K 4.5 Cr 1.22 GFR56   04/2014 Echo Study Conclusions  - Left ventricle: The cavity size was normal. Wall thickness was increased in a pattern of mild LVH. Systolic function was mildly to moderately reduced. The estimated ejection  fraction was in the range of 40% to 45%. There is diastolic dysfunction, indeterminant grade. - Regional wall motion abnormality: Hypokinesis of the mid anterior and basal-mid anteroseptal myocardium. - Aortic valve: Moderately calcified annulus. Trileaflet; moderately thickened leaflets. Valve area (VTI): 2.48 cm^2. Valve area (Vmax): 2.23 cm^2. - Mitral valve: Moderately calcified annulus. Mildly thickened leaflets . - Left atrium: The atrium was moderately dilated. - Right atrium: The atrium was mildly dilated. - Technically adequate study.   01/2015 Carotid US bilaterall 1-39%    Assessment and Plan  1. CAD/ICM  - no significant symptoms. EKG SR, no ischemic changes.  - he willcontinue current meds  2. Hyperlipidemia  - intolerant to statins,  he will continue zetia.   3. PAD - he will continue to follow with vascular   4. Carotid stenosis - mild bilateral disease by [place] 01/2015 - we will continue to monitor.   5. HTN - elevated in clinic. He has had previous troubles with low bp's and orthostatic dizziness. - we will try increasing lisionpril to 10mg  daily, check BMET in 2 weeks - submit bp log in 2 weeks    F/u 6 months.      Arnoldo Lenis, M.D.

## 2017-07-02 ENCOUNTER — Other Ambulatory Visit (HOSPITAL_COMMUNITY)
Admission: RE | Admit: 2017-07-02 | Discharge: 2017-07-02 | Disposition: A | Discharge status: Home / Self Care | Payer: Medicare Other | Source: Ambulatory Visit | Attending: Cardiology | Admitting: Cardiology

## 2017-07-02 DIAGNOSIS — R7309 Other abnormal glucose: Secondary | ICD-10-CM | POA: Insufficient documentation

## 2017-07-02 DIAGNOSIS — I255 Ischemic cardiomyopathy: Secondary | ICD-10-CM | POA: Insufficient documentation

## 2017-07-02 DIAGNOSIS — I10 Essential (primary) hypertension: Secondary | ICD-10-CM | POA: Diagnosis not present

## 2017-07-02 LAB — BASIC METABOLIC PANEL
ANION GAP: 9 (ref 5–15)
BUN: 20 mg/dL (ref 6–20)
CALCIUM: 10.3 mg/dL (ref 8.9–10.3)
CO2: 29 mmol/L (ref 22–32)
CREATININE: 1.32 mg/dL — AB (ref 0.61–1.24)
Chloride: 102 mmol/L (ref 101–111)
GFR, EST AFRICAN AMERICAN: 56 mL/min — AB (ref >60)
GFR, EST NON AFRICAN AMERICAN: 49 mL/min — AB (ref >60)
Glucose, Bld: 119 mg/dL — ABNORMAL HIGH (ref 65–99)
Potassium: 4.5 mmol/L (ref 3.5–5.1)
SODIUM: 140 mmol/L (ref 135–145)

## 2017-07-02 LAB — CBC
HEMATOCRIT: 40.7 % (ref 39.0–52.0)
Hemoglobin: 12.8 g/dL — ABNORMAL LOW (ref 13.0–17.0)
MCH: 28.7 pg (ref 26.0–34.0)
MCHC: 31.4 g/dL (ref 30.0–36.0)
MCV: 91.3 fL (ref 78.0–100.0)
Platelets: 198 10*3/uL (ref 150–400)
RBC: 4.46 MIL/uL (ref 4.22–5.81)
RDW: 15.1 % (ref 11.5–15.5)
WBC: 5.9 10*3/uL (ref 4.0–10.5)

## 2017-07-02 LAB — LIPID PANEL
CHOLESTEROL: 196 mg/dL (ref 0–200)
HDL: 36 mg/dL — AB (ref >40)
LDL CALC: 139 mg/dL — AB (ref 0–99)
TRIGLYCERIDES: 105 mg/dL (ref <150)
Total CHOL/HDL Ratio: 5.4 RATIO
VLDL: 21 mg/dL (ref 0–40)

## 2017-07-02 LAB — HEMOGLOBIN A1C
HEMOGLOBIN A1C: 6.3 % — AB (ref 4.8–5.6)
MEAN PLASMA GLUCOSE: 134.11 mg/dL

## 2017-07-02 LAB — MAGNESIUM: Magnesium: 2.1 mg/dL (ref 1.7–2.4)

## 2017-07-02 LAB — TSH: TSH: 15.554 u[IU]/mL — ABNORMAL HIGH (ref 0.350–4.500)

## 2017-07-04 ENCOUNTER — Telehealth: Payer: Self-pay

## 2017-07-04 MED ORDER — AMLODIPINE BESYLATE 5 MG PO TABS: 5.0000 mg | ORAL_TABLET | Freq: Every day | ORAL | 0 refills | Status: DC

## 2017-07-04 NOTE — Telephone Encounter (Signed)
BP's running too high. I would like to start norvasc 5mg  daily. Have him update [place] next Friday with bp's  J brancH MD

## 2017-07-04 NOTE — Telephone Encounter (Signed)
Patient called to report BP readings  7/27 BP 179/74 HR 58 7/28 BP 191/84 HR 66 7/29 BP 177/76 HR 59 7/30 BP 186/84 HR 64 7/31 BP 169/76 HR 56 8/1 BP 183/81 HR 64 8/2 BP 169/78 HR 59 8/4 BP 168/72 HR 61 8/5 BP 169/70 HR 59 8/6 BP 186/82 HR 64 8/7 BP 156/58 HR 65 8/8 BP 175/64 HR 53

## 2017-07-04 NOTE — Telephone Encounter (Signed)
Patient notified. New prescription sent to pharmacy

## 2017-07-18 ENCOUNTER — Telehealth: Payer: Self-pay | Admitting: *Deleted

## 2017-07-18 NOTE — Telephone Encounter (Signed)
-----   Message from Arnoldo Lenis, MD sent at 07/09/2017  4:27 PM EDT ----- Labs show thyroid level is very low, please make patient aware and also forward to his pcp. His cholesterol is mildly elevated, we may consider an alternative medication in the near future. Mild decrease in renal function, he needs to be aggressive in drinking water  Zandra Abts MD

## 2017-07-18 NOTE — Telephone Encounter (Signed)
Pt wife (DPR) made aware and voiced understanding - routed to pcp

## 2017-07-30 ENCOUNTER — Telehealth: Payer: Self-pay | Admitting: *Deleted

## 2017-07-30 NOTE — Telephone Encounter (Signed)
Pt aware & voiced understanding 

## 2017-07-30 NOTE — Telephone Encounter (Signed)
-----   Message from Laurine Blazer, LPN sent at 1/46/0479  5:54 PM EDT -----   ----- Message ----- From: Arnoldo Lenis, MD Sent: 07/25/2017   1:51 PM To: Laurine Blazer, LPN  BP log reviweed, numbers are improving, we will continue current meds at this time   Zandra Abts MD

## 2017-10-06 ENCOUNTER — Ambulatory Visit (INDEPENDENT_AMBULATORY_CARE_PROVIDER_SITE_OTHER): Payer: Medicare Other | Admitting: Ophthalmology

## 2017-10-28 DIAGNOSIS — J329 Chronic sinusitis, unspecified: Secondary | ICD-10-CM | POA: Diagnosis not present

## 2017-10-28 DIAGNOSIS — J069 Acute upper respiratory infection, unspecified: Secondary | ICD-10-CM | POA: Diagnosis not present

## 2017-11-19 ENCOUNTER — Other Ambulatory Visit: Payer: Self-pay

## 2017-11-19 MED ORDER — LISINOPRIL 10 MG PO TABS: 10.0000 mg | ORAL_TABLET | Freq: Every day | ORAL | 0 refills | Status: DC

## 2017-12-11 ENCOUNTER — Ambulatory Visit: Payer: Medicare Other | Admitting: Cardiology

## 2017-12-18 ENCOUNTER — Encounter: Payer: Self-pay | Admitting: Gastroenterology

## 2017-12-19 ENCOUNTER — Other Ambulatory Visit: Payer: Self-pay | Admitting: Cardiology

## 2018-01-20 ENCOUNTER — Other Ambulatory Visit: Payer: Self-pay

## 2018-01-20 ENCOUNTER — Ambulatory Visit (INDEPENDENT_AMBULATORY_CARE_PROVIDER_SITE_OTHER): Payer: Medicare Other | Admitting: Cardiology

## 2018-01-20 ENCOUNTER — Encounter: Payer: Self-pay | Admitting: Cardiology

## 2018-01-20 ENCOUNTER — Telehealth: Payer: Self-pay | Admitting: Cardiology

## 2018-01-20 VITALS — BP 162/69 | HR 57 | Ht 71.5 in | Wt 196.6 lb

## 2018-01-20 DIAGNOSIS — I10 Essential (primary) hypertension: Secondary | ICD-10-CM | POA: Diagnosis not present

## 2018-01-20 DIAGNOSIS — I5022 Chronic systolic (congestive) heart failure: Secondary | ICD-10-CM

## 2018-01-20 DIAGNOSIS — E782 Mixed hyperlipidemia: Secondary | ICD-10-CM

## 2018-01-20 DIAGNOSIS — I739 Peripheral vascular disease, unspecified: Secondary | ICD-10-CM | POA: Diagnosis not present

## 2018-01-20 DIAGNOSIS — I2510 Atherosclerotic heart disease of native coronary artery without angina pectoris: Secondary | ICD-10-CM | POA: Diagnosis not present

## 2018-01-20 NOTE — Progress Notes (Signed)
Clinical Summary Mr. Langlinais is a 84 y.o.male seen today for follow up of the following medical problems.    1. CAD/ICM  - history of prior CABG 2003 in [place]  - 09/2013 admission to [facility] with decompensated heart failure. Workup included an echo which showed an LVEF of 15%. He was transferred to [facility] for further management.  - cath 09/2013 showed LM 30%, LAD 100% proximal, LCX 50% ostial, RCA 100% mid with distal vessel filling with right to right and left to right collaterals. LIMA-LAD patent, graft from [place] to OM1, OM2, PDA occluded. No PCI targets, recommendations for medical management. RHC mean PA 43, wedge 22, CI 1.74.  - repeat echo 04/2014 shows LVEF improved to 40-45%.     - denies any chest pain. No SOB/DOE. No recent edema - compliant with meds.   2. Hyperlipidemia  - compliant with zetia  - lipitor and crestor have caused prior muscle aches, not on statin   - 06/2017 TC 196 TG 105 HDL 36 LDL 139  3. Lung CA - has completed radiation treatment.  - followed by Dr Dwana Curd  4. PAD - followed by vascular, previous stenting to left external iliac and right common iliac.  - s/p right femoral to below-knee popliteal bypass with vein 10/23/2015.  - no recent symptoms - not interested in following with vascular.    5. Carotid stenosis - repeat US 01/2015 with only mild bilateral disease.  - denies any recent symptoms.    6. HTN - compliant with meds   7. Hypothyroidism - patient has been reluctant for treatment.     Past Medical History:  Diagnosis Date  . Cancer ([place])    L lung  . Coronary artery disease   . Hypercholesteremia   . Hypertension   . Hypothyroidism   . PAD (peripheral artery disease) ([place])   . Pneumonia 09/2013  . Radiation april 2016  . Solitary pulmonary nodule on lung CT 12/13/13   11/15/14  . Stroke [facility]) 11/2008     No Known Allergies   Current Outpatient Medications  Medication Sig  Dispense Refill  . amLODipine (NORVASC) 5 MG tablet Take 1 tablet (5 mg total) by mouth daily. 90 tablet 0  . aspirin 81 MG EC tablet Take 81 mg by mouth daily.    . carvedilol (COREG) 25 MG tablet Take 0.5 tablets (12.5 mg total) by mouth 2 (two) times daily with a meal. 90 tablet 3  . ezetimibe (ZETIA) 10 MG tablet TAKE 1 TABLET DAILY 90 tablet 0  . lisinopril (PRINIVIL,ZESTRIL) 10 MG tablet Take 1 tablet (10 mg total) by mouth daily. 90 tablet 0  . potassium chloride (K-DUR,KLOR-CON) 10 MEQ tablet Take 1 tablet (10 mEq total) by mouth daily. 30 tablet 11   No current facility-administered medications for this visit.      Past Surgical History:  Procedure Laterality Date  . Angioplasty to Left femoral artery    . CARPAL TUNNEL RELEASE Left 05/11/2014   Procedure: LEFT CARPAL TUNNEL RELEASE;  Surgeon: Carole Civil, MD;  Location: [facility] OR[facility];  Service: Orthopedics;  Laterality: Left;  . COLONOSCOPY N/A 01/24/2015   Procedure: COLONOSCOPY;  Surgeon: Danie Binder, MD;  Location: [facility] ENDO SU[facility];  Service: Endoscopy;  Laterality: N/A;  130  . CORONARY ARTERY BYPASS GRAFT     x4  . FEMORAL-POPLITEAL BYPASS GRAFT Right 10/23/2015   Procedure: BYPASS GRAFT FEMORAL TO BELOW KNEE POPLITEAL ARTERY USING RIGHT NON-REVERSED  GREATER SAPPHENOUS VEIN;  Surgeon: [name], MD;  Location: [place];  Service: Vascular;  Laterality: Right;  . INTRAOPERATIVE ARTERIOGRAM Right 10/23/2015   Procedure: INTRA OPERATIVE ARTERIOGRAM - RIGHT LOWER LEG;  Surgeon: [name], MD;  Location: [place];  Service: Vascular;  Laterality: Right;  . KNEE ARTHROSCOPY Right   . LEFT AND RIGHT HEART CATHETERIZATION WITH CORONARY ANGIOGRAM N/A 09/29/2013   Procedure: LEFT AND RIGHT HEART CATHETERIZATION WITH CORONARY ANGIOGRAM;  Surgeon: Burnell Blanks, MD;  Location: [facility] CATH LAB;  Service: Cardiovascular;  Laterality: N/A;  . PERIPHERAL VASCULAR CATHETERIZATION N/A 04/21/2015   Procedure: Abdominal Aortogram  w/Lower Extremity;  Surgeon: [name], MD;  Location: [place] CV LAB;  Service: Cardiovascular;  Laterality: N/A;  . Stent to right femoral artery    . TRIGGER FINGER RELEASE Bilateral   . VEIN HARVEST Right 10/23/2015   Procedure: [place];  Surgeon: [name], MD;  Location: [facility] OR;  Service: Vascular;  Laterality: Right;     No Known Allergies    Family History  Problem Relation Age of Onset  . Heart attack Father   . Hyperlipidemia Father   . Diabetes Mother   . Diabetes Brother   . Colon cancer Neg Hx      Social History Mr. Nied reports that he has been smoking cigars.  He started smoking about 68 years ago. He has a 60.00 pack-year smoking history. he has never used smokeless tobacco. Mr. Brull reports that he does not drink alcohol.   Review of Systems CONSTITUTIONAL: No weight loss, fever, chills, weakness or fatigue.  HEENT: Eyes: No visual loss, blurred vision, double vision or yellow sclerae.No hearing loss, sneezing, congestion, runny nose or sore throat.  SKIN: No rash or itching.  CARDIOVASCULAR: per hpi RESPIRATORY: No shortness of breath, cough or sputum.  GASTROINTESTINAL: No anorexia, nausea, vomiting or diarrhea. No abdominal pain or blood.  GENITOURINARY: No burning on urination, no polyuria NEUROLOGICAL: No headache, dizziness, syncope, paralysis, ataxia, numbness or tingling in the extremities. No change in bowel or bladder control.  MUSCULOSKELETAL: No muscle, back pain, joint pain or stiffness.  LYMPHATICS: No enlarged nodes. No history of splenectomy.  PSYCHIATRIC: No history of depression or anxiety.  ENDOCRINOLOGIC: No reports of sweating, cold or heat intolerance. No polyuria or polydipsia.  Marland Kitchen   Physical Examination Vitals:   01/20/18 1114  BP: (!) 162/69  Pulse: (!) 57   Vitals:   01/20/18 1114  Weight: 196 lb 9.6 oz (89.2 kg)  Height: 5' 11.5" (1.816 m)    Gen: resting  comfortably, no acute distress HEENT: no scleral icterus, pupils equal round and reactive, no palptable cervical adenopathy,  CV: RRR, no m/r/g, no jvd Resp: Clear to auscultation bilaterally GI: abdomen is soft, non-tender, non-distended, normal bowel sounds, no hepatosplenomegaly MSK: extremities are warm, no edema.  Skin: warm, no rash Neuro:  no focal deficits Psych: appropriate affect   Diagnostic Studies 09/29/13 Cath Hemodynamic Findings: Ao: 162/70  LV: 163/19/29  RA: 12 RV: 66/7/16  PA: 67/26 (mean 43)  PCWP: 22  Fick Cardiac Output: 3.5 L/min  Fick Cardiac Index: 1.74 L/min/m2  Central Aortic Saturation: 91%  Pulmonary Artery Saturation: 50%  Angiographic Findings:  Left main: Distal 30% stenosis.  Left Anterior Descending Artery: 100% proximal occlusion. Proximal, mid and distal vessel fills from the patent IMA graft.  Circumflex Artery: Large caliber vessel with large intermediate Teya Otterson. Diffuse non-obstructive plaque in the  large bifurcating intermediate Darnel Mchan. The ostium of the Circumflex has 50% stenosis.  Right Coronary Artery: Large dominant vessel with 100% mid occlusion. The distal vessel fills right to right bridging collaterals and left to right collaterals.  Graft Anatomy:  LIMA to LAD is patent  Free Radial (? Y graft from LIMA) to OM1, OM2, PDA is occluded.  Left Ventricular Angiogram: Deferred.  Impression:  1. Triple vessel CAD with patent LIMA to LAD, occluded RCA with good collateral filling, moderate disease in Circumflex  2. No focal targets for PCI  3. Ischemic cardiomyopathy with acute systolic CHF   75/6/43 Echo LVEF 15-20%, moderate LVH, severe diffuse hypokinesis, restrictive diastolic dysfunction, multiple WMAs, moderate MR, severe RV dysfunction RV TAPSE 0.7 cm, PASP 61 mmHg.   12/2013 Lab TC 175 TG 111 LDL 116 HDL 37  TSH 7.7 HgbA1c 6.6 Na 143 K 4.5 Cr 1.22 GFR56   04/2014 Echo Study Conclusions  -  Left ventricle: The cavity size was normal. Wall thickness was increased in a pattern of mild LVH. Systolic function was mildly to moderately reduced. The estimated ejection fraction was in the range of 40% to 45%. There is diastolic dysfunction, indeterminant grade. - Regional wall motion abnormality: Hypokinesis of the mid anterior and basal-mid anteroseptal myocardium. - Aortic valve: Moderately calcified annulus. Trileaflet; moderately thickened leaflets. Valve area (VTI): 2.48 cm^2. Valve area (Vmax): 2.23 cm^2. - Mitral valve: Moderately calcified annulus. Mildly thickened leaflets . - Left atrium: The atrium was moderately dilated. - Right atrium: The atrium was mildly dilated. - Technically adequate study.   01/2015 Carotid US bilaterall 1-39%     Assessment and Plan  1. CAD/ICM/CAD - no recent symptoms. - we will repeat echo, pending function consider changing lisinopril to entresto along with further CHF med titration.   2. Hyperlipidemia  - intolerant to statins, continue zetia - we discussed in detail pcsk-9 inhibitors today, he wishes to think it over  3. PAD - no recent symptoms, continue to moniotor.   4. Carotid stenosis - mild bilateral disease by [place] 01/2015 - continue to monitor.   5. HTN - elevated in clinic. He has had previous troubles with low bp's and orthostatic dizziness. - we will continue current meds at this time  6. Hypothyroidism - forwarded most recent labs to pcp, asked patient again to discuss with pcp possible treatment.      F/u 4 months  Arnoldo Lenis, M.D.

## 2018-01-20 NOTE — Telephone Encounter (Signed)
Pre-cert Verification for the following procedure   Echo scheduled for 02/05/18

## 2018-01-20 NOTE — Patient Instructions (Addendum)
Your physician wants you to follow-up in: [place] will receive a reminder letter in the mail two months in advance. If you don't receive a letter, please call our office to schedule the follow-up appointment.  Your physician recommends that you continue on your current medications as directed. Please refer to the Current Medication list given to you today.  Your physician has requested that you have an echocardiogram. Echocardiography is a painless test that uses sound waves to create images of your heart. It provides your doctor with information about the size and shape of your heart and how well your heart's chambers and valves are working. This procedure takes approximately one hour. There are no restrictions for this procedure.  Thank you for choosing [facility]!!

## 2018-01-23 ENCOUNTER — Encounter: Payer: Self-pay | Admitting: Cardiology

## 2018-01-24 ENCOUNTER — Other Ambulatory Visit: Payer: Self-pay | Admitting: Cardiology

## 2018-02-05 ENCOUNTER — Ambulatory Visit (INDEPENDENT_AMBULATORY_CARE_PROVIDER_SITE_OTHER): Payer: Medicare Other

## 2018-02-05 ENCOUNTER — Other Ambulatory Visit: Payer: Self-pay

## 2018-02-05 DIAGNOSIS — I5022 Chronic systolic (congestive) heart failure: Secondary | ICD-10-CM

## 2018-02-11 ENCOUNTER — Telehealth: Payer: Self-pay | Admitting: *Deleted

## 2018-02-11 MED ORDER — SACUBITRIL-VALSARTAN 24-26 MG PO TABS: 1.0000 | ORAL_TABLET | Freq: Two times a day (BID) | ORAL | 1 refills | Status: DC

## 2018-02-11 MED ORDER — SACUBITRIL-VALSARTAN 24-26 MG PO TABS: 1.0000 | ORAL_TABLET | Freq: Two times a day (BID) | ORAL | 0 refills | Status: DC

## 2018-02-11 NOTE — Telephone Encounter (Signed)
Pt aware and voiced understanding - wants to go ahead and switch to [facility] - pt will hold lisinopril for 2 days before starting Entresto - will provide samples [facility] - routed results to pcp

## 2018-02-11 NOTE — Telephone Encounter (Signed)
-----   Message from Arnoldo Lenis, MD sent at 02/10/2018 12:22 PM EDT ----- Heart function remains weak but overall stable aroundd 40% (normal is 50--60%). There is a new medicine that would have greater benefit than the lisinopril he is currently on (lowers by additional 20% risk of dying or being admitted to hospital with heart failure). If interested we can go ahead and make the switch, if wants to wait can discuss further at our f/u in July. Would need to hold lisinopril 2 days, then start entresto 24/26mg  bid    Zandra Abts MD

## 2018-02-26 DIAGNOSIS — Z961 Presence of intraocular lens: Secondary | ICD-10-CM | POA: Diagnosis not present

## 2018-03-09 ENCOUNTER — Telehealth: Payer: Self-pay | Admitting: *Deleted

## 2018-03-09 MED ORDER — SACUBITRIL-VALSARTAN 24-26 MG PO TABS: 1.0000 | ORAL_TABLET | Freq: Two times a day (BID) | ORAL | 0 refills | Status: DC

## 2018-03-09 NOTE — Telephone Encounter (Signed)
Wife calling to state that husband has used all the samples of his new medication (Entresto 24/26mg ) & not received prescription yet.  Informed patient that #180 with 1 refill was sent to [facility] on 02/11/2018.  Will leave sample box up front for patient to pick up.  Advised to call [facility] to see what hold up is.  She verbalized understanding.

## 2018-03-10 NOTE — Telephone Encounter (Signed)
PA approved start 02/08/18 - PA # [id number]

## 2018-03-12 DIAGNOSIS — S300XXA Contusion of lower back and pelvis, initial encounter: Secondary | ICD-10-CM | POA: Diagnosis not present

## 2018-03-12 DIAGNOSIS — M545 Low back pain: Secondary | ICD-10-CM | POA: Diagnosis not present

## 2018-03-18 ENCOUNTER — Emergency Department (HOSPITAL_COMMUNITY): Payer: Medicare Other

## 2018-03-18 ENCOUNTER — Other Ambulatory Visit: Payer: Self-pay

## 2018-03-18 ENCOUNTER — Emergency Department (HOSPITAL_COMMUNITY)
Admission: EM | Admit: 2018-03-18 | Discharge: 2018-03-18 | Discharge status: Against Medical Advice | Payer: Medicare Other | Attending: Emergency Medicine | Admitting: Emergency Medicine

## 2018-03-18 ENCOUNTER — Encounter (HOSPITAL_COMMUNITY): Payer: Self-pay | Admitting: Emergency Medicine

## 2018-03-18 DIAGNOSIS — S300XXA Contusion of lower back and pelvis, initial encounter: Secondary | ICD-10-CM | POA: Diagnosis not present

## 2018-03-18 DIAGNOSIS — Z923 Personal history of irradiation: Secondary | ICD-10-CM | POA: Diagnosis not present

## 2018-03-18 DIAGNOSIS — Z79899 Other long term (current) drug therapy: Secondary | ICD-10-CM | POA: Insufficient documentation

## 2018-03-18 DIAGNOSIS — Z951 Presence of aortocoronary bypass graft: Secondary | ICD-10-CM | POA: Insufficient documentation

## 2018-03-18 DIAGNOSIS — I110 Hypertensive heart disease with heart failure: Secondary | ICD-10-CM | POA: Insufficient documentation

## 2018-03-18 DIAGNOSIS — Y92012 Bathroom of single-family (private) house as the place of occurrence of the external cause: Secondary | ICD-10-CM | POA: Insufficient documentation

## 2018-03-18 DIAGNOSIS — I509 Heart failure, unspecified: Secondary | ICD-10-CM | POA: Insufficient documentation

## 2018-03-18 DIAGNOSIS — Z5320 Procedure and treatment not carried out because of patient's decision for unspecified reasons: Secondary | ICD-10-CM | POA: Diagnosis not present

## 2018-03-18 DIAGNOSIS — Z8673 Personal history of transient ischemic attack (TIA), and cerebral infarction without residual deficits: Secondary | ICD-10-CM | POA: Insufficient documentation

## 2018-03-18 DIAGNOSIS — Y998 Other external cause status: Secondary | ICD-10-CM | POA: Diagnosis not present

## 2018-03-18 DIAGNOSIS — W19XXXA Unspecified fall, initial encounter: Secondary | ICD-10-CM | POA: Diagnosis not present

## 2018-03-18 DIAGNOSIS — I2510 Atherosclerotic heart disease of native coronary artery without angina pectoris: Secondary | ICD-10-CM | POA: Insufficient documentation

## 2018-03-18 DIAGNOSIS — F17210 Nicotine dependence, cigarettes, uncomplicated: Secondary | ICD-10-CM | POA: Insufficient documentation

## 2018-03-18 DIAGNOSIS — Z7982 Long term (current) use of aspirin: Secondary | ICD-10-CM | POA: Diagnosis not present

## 2018-03-18 DIAGNOSIS — Z85118 Personal history of other malignant neoplasm of bronchus and lung: Secondary | ICD-10-CM | POA: Diagnosis not present

## 2018-03-18 DIAGNOSIS — Y9389 Activity, other specified: Secondary | ICD-10-CM | POA: Insufficient documentation

## 2018-03-18 DIAGNOSIS — S3993XA Unspecified injury of pelvis, initial encounter: Secondary | ICD-10-CM | POA: Diagnosis present

## 2018-03-18 NOTE — ED Notes (Signed)
Left at 0154 via wheelchair

## 2018-03-18 NOTE — ED Notes (Signed)
Pt wife asked for a wheelchair and said pt was leaving. Tech attempted to find nurse, but they left before nurse was found. Pt left AMA.

## 2018-03-18 NOTE — ED Triage Notes (Signed)
Pt fell x 3 weeks ago trying to get into the bathroom. pts states his leg left became weak and he fell, hitting his back on the bed.  Pt c/o of continued pain in his lower back.

## 2018-03-18 NOTE — ED Provider Notes (Signed)
[facility] EMERGENCY DEPARTMENT Provider Note   CSN: [id number] Arrival date & time: 03/18/18  1243     History   Chief Complaint Chief Complaint  Patient presents with  . Fall    HPI Gary Odonnell is a 84 y.o. male.  Status post accidental mechanical fall in the bathroom approximately 12 days ago striking his left superior pelvis.  He has persistent pain in the same area.  He was seen in the local urgent care center recently and an x-ray was negative.  He is ambulatory.  No bowel or bladder incontinence.  No head or neck injury or extremity pain.  Severity of pain is mild to moderate.  Palpation makes pain worse.     Past Medical History:  Diagnosis Date  . Cancer ([place])    L lung  . Coronary artery disease   . Hypercholesteremia   . Hypertension   . PAD (peripheral artery disease) ([place])   . Pneumonia 09/2013  . Radiation april 2016  . Solitary pulmonary nodule on lung CT 12/13/13   11/15/14  . Stroke [facility]) 11/2008    Patient Active Problem List   Diagnosis Date Noted  . Iliac artery stenosis, left ([place]) 04/22/2015  . PAD (peripheral artery disease) ([place]) 04/21/2015  . Primary cancer of left lower lobe of lung ([place]) 02/02/2015  . Abnormal PET scan of colon 01/04/2015  . Solitary pulmonary nodule on lung CT   . Carpal tunnel syndrome, left 05/03/2014  . Acute renal failure ([place]) 09/27/2013  . HTN (hypertension) 09/26/2013  . CHF (congestive heart failure) ([place]) 09/26/2013  . CVA (cerebral infarction) 09/26/2013  . CAP (community acquired pneumonia) 09/26/2013  . CAD (coronary atherosclerotic disease) 09/26/2013  . S/P CABG x 4 09/26/2013    Past Surgical History:  Procedure Laterality Date  . Angioplasty to Left femoral artery    . CARPAL TUNNEL RELEASE Left 05/11/2014   Procedure: LEFT CARPAL TUNNEL RELEASE;  Surgeon: Carole Civil, MD;  Location: [facility] OR[facility];  Service: Orthopedics;  Laterality: Left;  . COLONOSCOPY N/A 01/24/2015   Procedure: COLONOSCOPY;   Surgeon: Danie Binder, MD;  [facility];  Service: Endoscopy;  Laterality: N/A;  130  . CORONARY ARTERY BYPASS GRAFT     x4  . FEMORAL-POPLITEAL BYPASS GRAFT Right 10/23/2015   Procedure: BYPASS GRAFT FEMORAL TO BELOW KNEE POPLITEAL ARTERY USING RIGHT NON-REVERSED GREATER SAPPHENOUS VEIN;  Surgeon: [name], MD;  Location: [place];  Service: Vascular;  Laterality: Right;  . INTRAOPERATIVE ARTERIOGRAM Right 10/23/2015   Procedure: INTRA OPERATIVE ARTERIOGRAM - RIGHT LOWER LEG;  Surgeon: [name], MD;  Location: [place];  Service: Vascular;  Laterality: Right;  . KNEE ARTHROSCOPY Right   . LEFT AND RIGHT HEART CATHETERIZATION WITH CORONARY ANGIOGRAM N/A 09/29/2013   Procedure: LEFT AND RIGHT HEART CATHETERIZATION WITH CORONARY ANGIOGRAM;  Surgeon: Burnell Blanks, MD;  Location: [facility] CATH LAB;  Service: Cardiovascular;  Laterality: N/A;  . PERIPHERAL VASCULAR CATHETERIZATION N/A 04/21/2015   Procedure: Abdominal Aortogram w/Lower Extremity;  Surgeon: [name], MD;  Location: [place] CV LAB;  Service: Cardiovascular;  Laterality: N/A;  . Stent to right femoral artery    . TRIGGER FINGER RELEASE Bilateral   . VEIN HARVEST Right 10/23/2015   Procedure: [place];  Surgeon: [name], MD;  Location: [place];  Service: Vascular;  Laterality: Right;        Home Medications    Prior to  Admission medications   Medication Sig Start Date End Date Taking? Authorizing Provider  aspirin 81 MG EC tablet Take 81 mg by mouth daily.   Yes [provider]  carvedilol (COREG) 25 MG tablet TAKE ONE-HALF (1/2) TABLET TWICE A DAY WITH MEALS 01/26/18  Yes Branch, Alphonse Guild, MD  ezetimibe (ZETIA) 10 MG tablet TAKE 1 TABLET DAILY 12/19/17  Yes Branch, Alphonse Guild, MD  methocarbamol (ROBAXIN) 500 MG tablet take two tablets every six hours as needed for muscle spasms 03/12/18  Yes [provider]  potassium chloride  (K-DUR,KLOR-CON) 10 MEQ tablet Take 1 tablet (10 mEq total) by mouth daily. 10/01/13  Yes Barrett, Evelene Croon, PA-C  sacubitril-valsartan (ENTRESTO) 24-26 MG Take 1 tablet by mouth 2 (two) times daily. 03/09/18  Yes Branch, Alphonse Guild, MD  amLODipine (NORVASC) 5 MG tablet Take 1 tablet (5 mg total) by mouth daily. Patient not taking: Reported on 01/20/2018 07/04/17 10/02/17  Arnoldo Lenis, MD    Family History Family History  Problem Relation Age of Onset  . Heart attack Father   . Hyperlipidemia Father   . Diabetes Mother   . Diabetes Brother   . Colon cancer Neg Hx     Social History Social History   Tobacco Use  . Smoking status: Current Every Day Smoker    Packs/day: 1.00    Years: 60.00    Pack years: 60.00    Types: Cigars    Start date: 11/[birth year]/1949  . Smokeless tobacco: Never Used  . Tobacco comment: former cigarette smoker/smoked cigarettes for 30 years  Substance Use Topics  . Alcohol use: No    Alcohol/week: 0.0 oz  . Drug use: No     Allergies   Patient has no known allergies.   Review of Systems Review of Systems  All other systems reviewed and are negative.    Physical Exam Updated Vital Signs BP (!) 165/74 (BP Location: Right Arm)   Pulse (!) 57   Temp 98.7 F (37.1 C) (Oral)   Resp 17   Ht 5\' 11"  (1.803 m)   Wt 86.2 kg (190 lb)   SpO2 97%   BMI 26.50 kg/m   Physical Exam  Constitutional: He is oriented to person, place, and time. He appears well-developed and well-nourished.  HENT:  Head: Normocephalic and atraumatic.  Eyes: Conjunctivae are normal.  Neck: Neck supple.  Cardiovascular: Normal rate and regular rhythm.  Pulmonary/Chest: Effort normal and breath sounds normal.  Abdominal: Soft. Bowel sounds are normal.  Musculoskeletal:  Tender left superior posterior pelvic area.  Neurological: He is alert and oriented to person, place, and time.  Skin: Skin is warm and dry.  Psychiatric: He has a normal mood and affect. His behavior is  normal.  Nursing note and vitals reviewed.    ED Treatments / Results  Labs (all labs ordered are listed, but only abnormal results are displayed) Labs Reviewed - No data to display  EKG None  Radiology No results found.  Procedures Procedures (including critical care time)  Medications Ordered in ED Medications - No data to display   Initial Impression / Assessment and Plan / ED Course  I have reviewed the triage vital signs and the nursing notes.  Pertinent labs & imaging results that were available during my care of the patient were reviewed by me and considered in my medical decision making (see chart for details).     Status post fall with left posterior pelvis pain.  Plain films of  the pelvis were ordered.  Patient was requesting an MRI, but I did not think this was clinically indicated initially.  I was respectful to the patient and his wife and answered all their questions.  Patient opted to leave AMA without my knowledge.  Final Clinical Impressions(s) / ED Diagnoses   Final diagnoses:  Contusion of pelvis, initial encounter    ED Discharge Orders    None       Nat Christen, MD 03/18/18 1401

## 2018-03-19 ENCOUNTER — Other Ambulatory Visit: Payer: Self-pay | Admitting: Cardiology

## 2018-05-12 ENCOUNTER — Encounter: Payer: Self-pay | Admitting: Cardiology

## 2018-05-12 ENCOUNTER — Other Ambulatory Visit: Payer: Self-pay

## 2018-05-12 ENCOUNTER — Ambulatory Visit (INDEPENDENT_AMBULATORY_CARE_PROVIDER_SITE_OTHER): Payer: Medicare Other | Admitting: Cardiology

## 2018-05-12 VITALS — BP 180/69 | HR 54 | Ht 71.0 in | Wt 193.0 lb

## 2018-05-12 DIAGNOSIS — I10 Essential (primary) hypertension: Secondary | ICD-10-CM | POA: Diagnosis not present

## 2018-05-12 DIAGNOSIS — I2510 Atherosclerotic heart disease of native coronary artery without angina pectoris: Secondary | ICD-10-CM | POA: Diagnosis not present

## 2018-05-12 DIAGNOSIS — E785 Hyperlipidemia, unspecified: Secondary | ICD-10-CM | POA: Diagnosis not present

## 2018-05-12 DIAGNOSIS — E039 Hypothyroidism, unspecified: Secondary | ICD-10-CM | POA: Diagnosis not present

## 2018-05-12 DIAGNOSIS — R7309 Other abnormal glucose: Secondary | ICD-10-CM

## 2018-05-12 DIAGNOSIS — I5022 Chronic systolic (congestive) heart failure: Secondary | ICD-10-CM

## 2018-05-12 MED ORDER — SACUBITRIL-VALSARTAN 49-51 MG PO TABS: 1.0000 | ORAL_TABLET | Freq: Two times a day (BID) | ORAL | 3 refills | Status: DC

## 2018-05-12 NOTE — Patient Instructions (Signed)
Medication Instructions:   Increase Entresto to 49/51 twice a day.  Continue all other medications.    Labwork:  CMET, CBC, TSH, Magnesium, Lipids, HgA1c - orders given today.  Office will contact with results via phone or letter.    Testing/Procedures: none  Follow-Up: Your physician wants you to follow up in: 6 months.  You will receive a reminder letter in the mail one-two months in advance.  If you don't receive a letter, please call our office to schedule the follow up appointment   Any Other Special Instructions Will Be Listed Below (If Applicable).  If you need a refill on your cardiac medications before your next appointment, please call your pharmacy.

## 2018-05-12 NOTE — Progress Notes (Signed)
Clinical Summary Gary Odonnell is a 84 y.o.male seen today for follow up of the following medical problems.    1. CAD/ICM/Chronic systolic heart failure - history of prior CABG 2003 in [place]  - 09/2013 admission to [facility] with decompensated heart failure. Workup included an echo which showed an LVEF of 15%. He was transferred to [facility] for further management.  - cath 09/2013 showed LM 30%, LAD 100% proximal, LCX 50% ostial, RCA 100% mid with distal vessel filling with right to right and left to right collaterals. LIMA-LAD patent, graft from [place] to OM1, OM2, PDA occluded. No PCI targets, recommendations for medical management. RHC mean PA 43, wedge 22, CI 1.74.  - repeat echo 04/2014 shows LVEF improved to 40-45%.    - 01/2018 echo LVEF 35-40%, grade II diastolic dysfunction.  - compliant with meds, no recent symptoms    2. Hyperlipidemia  - lipitor and crestor have caused prior muscle aches, not on statin  - 06/2017 TC 196 TG 105 HDL 36 LDL 139 - compliant with zetia   3. Lung CA - has completed radiation treatment.  - followed by Dr Dwana Curd  4. PAD - followed by vascular, previous stenting to left external iliac and right common iliac.  - s/p right femoral to below-knee popliteal bypass with vein 10/23/2015.  - not interested in following with vascular.  - no recent claudication  5. Carotid stenosis - repeat US 01/2015 with only mild bilateral disease.  - no recent neuro symptoms.    6. HTN - he is compliant with meds   7. Hypothyroidism - patient has been reluctant for treatment. - followed by pcp   Past Medical History:  Diagnosis Date  . Cancer ([place])    L lung  . Coronary artery disease   . Hypercholesteremia   . Hypertension   . PAD (peripheral artery disease) ([place])   . Pneumonia 09/2013  . Radiation april 2016  . Solitary pulmonary nodule on lung CT 12/13/13   11/15/14  . Stroke [facility]) 11/2008     No Known  Allergies   Current Outpatient Medications  Medication Sig Dispense Refill  . amLODipine (NORVASC) 5 MG tablet Take 5 mg by mouth daily.    Marland Kitchen aspirin 81 MG EC tablet Take 81 mg by mouth daily.    . carvedilol (COREG) 25 MG tablet TAKE ONE-HALF (1/2) TABLET TWICE A DAY WITH MEALS 90 tablet 3  . ezetimibe (ZETIA) 10 MG tablet TAKE 1 TABLET DAILY 90 tablet 1  . potassium chloride (K-DUR,KLOR-CON) 10 MEQ tablet Take 1 tablet (10 mEq total) by mouth daily. 30 tablet 11  . sacubitril-valsartan (ENTRESTO) 24-26 MG Take 1 tablet by mouth 2 (two) times daily. 28 tablet 0   No current facility-administered medications for this visit.      Past Surgical History:  Procedure Laterality Date  . Angioplasty to Left femoral artery    . CARPAL TUNNEL RELEASE Left 05/11/2014   Procedure: LEFT CARPAL TUNNEL RELEASE;  Surgeon: Carole Civil, MD;  Location: [facility];  Service: Orthopedics;  Laterality: Left;  . COLONOSCOPY N/A 01/24/2015   Procedure: COLONOSCOPY;  Surgeon: Danie Binder, MD;  Location[facility] ENDO [facility];  Service: Endoscopy;  Laterality: N/A;  130  . CORONARY ARTERY BYPASS GRAFT     x4  . FEMORAL-POPLITEAL BYPASS GRAFT Right 10/23/2015   Procedure: BYPASS GRAFT FEMORAL TO BELOW KNEE POPLITEAL ARTERY USING RIGHT NON-REVERSED GREATER SAPPHENOUS VEIN;  Surgeon: [name],  MD;  Location: [place];  Service: Vascular;  Laterality: Right;  . INTRAOPERATIVE ARTERIOGRAM Right 10/23/2015   Procedure: INTRA OPERATIVE ARTERIOGRAM - RIGHT LOWER LEG;  Surgeon: [name], MD;  Location: [place];  Service: Vascular;  Laterality: Right;  . KNEE ARTHROSCOPY Right   . LEFT AND RIGHT HEART CATHETERIZATION WITH CORONARY ANGIOGRAM N/A 09/29/2013   Procedure: LEFT AND RIGHT HEART CATHETERIZATION WITH CORONARY ANGIOGRAM;  Surgeon: Burnell Blanks, MD;  Location: [facility] CATH LAB;  Service: Cardiovascular;  Laterality: N/A;  . PERIPHERAL VASCULAR CATHETERIZATION N/A 04/21/2015   Procedure: Abdominal  Aortogram w/Lower Extremity;  Surgeon: [name], MD;  Location: [place] CV LAB;  Service: Cardiovascular;  Laterality: N/A;  . Stent to right femoral artery    . TRIGGER FINGER RELEASE Bilateral   . VEIN HARVEST Right 10/23/2015   Procedure: [place];  Surgeon: [name], MD;  Location[facility];  Service: Vascular;  Laterality: Right;     No Known Allergies    Family History  Problem Relation Age of Onset  . Heart attack Father   . Hyperlipidemia Father   . Diabetes Mother   . Diabetes Brother   . Colon cancer Neg Hx      Social History Gary Odonnell reports that he has been smoking cigars.  He started smoking about 68 years ago. He has a 60.00 pack-year smoking history. He has never used smokeless tobacco. Gary Odonnell reports that he does not drink alcohol.   Review of Systems CONSTITUTIONAL: No weight loss, fever, chills, weakness or fatigue.  HEENT: Eyes: No visual loss, blurred vision, double vision or yellow sclerae.No hearing loss, sneezing, congestion, runny nose or sore throat.  SKIN: No rash or itching.  CARDIOVASCULAR: per hpi RESPIRATORY: per hpi GASTROINTESTINAL: No anorexia, nausea, vomiting or diarrhea. No abdominal pain or blood.  GENITOURINARY: No burning on urination, no polyuria NEUROLOGICAL: No headache, dizziness, syncope, paralysis, ataxia, numbness or tingling in the extremities. No change in bowel or bladder control.  MUSCULOSKELETAL: No muscle, back pain, joint pain or stiffness.  LYMPHATICS: No enlarged nodes. No history of splenectomy.  PSYCHIATRIC: No history of depression or anxiety.  ENDOCRINOLOGIC: No reports of sweating, cold or heat intolerance. No polyuria or polydipsia.  Marland Kitchen   Physical Examination Vitals:   05/12/18 1058  BP: (!) 180/69  Pulse: (!) 54  SpO2: 97%   Filed Weights   05/12/18 1058  Weight: 193 lb (87.5 kg)    Gen: resting comfortably, no acute distress HEENT: no scleral  icterus, pupils equal round and reactive, no palptable cervical adenopathy,  CV: RRR, no m/r/g, no jvd Resp: Clear to auscultation bilaterally GI: abdomen is soft, non-tender, non-distended, normal bowel sounds, no hepatosplenomegaly MSK: extremities are warm, no edema.  Skin: warm, no rash Neuro:  no focal deficits Psych: appropriate affect   Diagnostic Studies 09/29/13 Cath Hemodynamic Findings: Ao: 162/70  LV: 163/19/29  RA: 12 RV: 66/7/16  PA: 67/26 (mean 43)  PCWP: 22  Fick Cardiac Output: 3.5 L/min  Fick Cardiac Index: 1.74 L/min/m2  Central Aortic Saturation: 91%  Pulmonary Artery Saturation: 50%  Angiographic Findings:  Left main: Distal 30% stenosis.  Left Anterior Descending Artery: 100% proximal occlusion. Proximal, mid and distal vessel fills from the patent IMA graft.  Circumflex Artery: Large caliber vessel with large intermediate branch. Diffuse non-obstructive plaque in the large bifurcating intermediate branch. The ostium of the Circumflex has 50% stenosis.  Right Coronary Artery: Large dominant  vessel with 100% mid occlusion. The distal vessel fills right to right bridging collaterals and left to right collaterals.  Graft Anatomy:  LIMA to LAD is patent  Free Radial (? Y graft from LIMA) to OM1, OM2, PDA is occluded.  Left Ventricular Angiogram: Deferred.  Impression:  1. Triple vessel CAD with patent LIMA to LAD, occluded RCA with good collateral filling, moderate disease in Circumflex  2. No focal targets for PCI  3. Ischemic cardiomyopathy with acute systolic CHF   86/7/61 Echo LVEF 15-20%, moderate LVH, severe diffuse hypokinesis, restrictive diastolic dysfunction, multiple WMAs, moderate MR, severe RV dysfunction RV TAPSE 0.7 cm, PASP 61 mmHg.   12/2013 Lab TC 175 TG 111 LDL 116 HDL 37  TSH 7.7 HgbA1c 6.6 Na 143 K 4.5 Cr 1.22 GFR56   04/2014 Echo Study Conclusions  - Left ventricle: The cavity size was normal. Wall  thickness was increased in a pattern of mild LVH. Systolic function was mildly to moderately reduced. The estimated ejection fraction was in the range of 40% to 45%. There is diastolic dysfunction, indeterminant grade. - Regional wall motion abnormality: Hypokinesis of the mid anterior and basal-mid anteroseptal myocardium. - Aortic valve: Moderately calcified annulus. Trileaflet; moderately thickened leaflets. Valve area (VTI): 2.48 cm^2. Valve area (Vmax): 2.23 cm^2. - Mitral valve: Moderately calcified annulus. Mildly thickened leaflets . - Left atrium: The atrium was moderately dilated. - Right atrium: The atrium was mildly dilated. - Technically adequate study.   01/2015 Carotid US bilaterall 1-39%    01/2018 echo Study Conclusions  - Left ventricle: The cavity size was normal. Wall thickness was   increased in a pattern of moderate LVH. Systolic function was   moderately reduced. The estimated ejection fraction was in the   range of 35% to 40%. Diffuse hypokinesis. Features are consistent   with a pseudonormal left ventricular filling pattern, with   concomitant abnormal relaxation and increased filling pressure   (grade 2 diastolic dysfunction). Doppler parameters are   consistent with high ventricular filling pressure. - Regional wall motion abnormality: Moderate hypokinesis of the   basal-mid anteroseptal, mid inferoseptal, and mid inferior   myocardium. - Aortic valve: Moderately calcified annulus. Mildly thickened,   mildly calcified leaflets. There appears to be at least mild (if   not mild to moderate) calcific aortic valvular stenosis. I   suspect peak velocity and mean gradient is underestimated due to   depressed LV function. Peak velocity (S): 159 cm/s. Mean gradient   (S): 6 mm Hg. Valve area (VTI): 1.66 cm^2. Valve area (Vmax):   1.73 cm^2. Valve area (Vmean): 1.52 cm^2. - Mitral valve: Calcified annulus. Restricted leaflet motion due to   depressed  LV function. There was mild regurgitation. Valve area   by continuity equation (using LVOT flow): 1.21 cm^2. - Left atrium: The atrium was mildly dilated. - Right ventricle: Systolic function was moderately reduced. - Tricuspid valve: There was mild regurgitation  Assessment and Plan  1. CAD/ICM/CAD - no recent symptoms - we will increase entresto to 49/51mg  bid, check labs in 2 weeks.  - EKG today shows SR with conduction delay  2. Hyperlipidemia  - intolerant to statins,continue zetia - we discussed in detail pcsk-9 inhibitors, he has been reluctant - repeat lipid panel.   3. PAD - no symptoms, continue to monitor at this time.  - continue medical therapy  4. Carotid stenosis - mild bilateral disease by [place] 01/2015 -no recent symptoms, continue medical therapy.   5. HTN -  above goal, increase entresto to 49/51mg  bid.     Obtain annual labs. F/u 6 months   Arnoldo Lenis, M.D.

## 2018-05-17 ENCOUNTER — Encounter: Payer: Self-pay | Admitting: Cardiology

## 2018-05-19 DIAGNOSIS — I2510 Atherosclerotic heart disease of native coronary artery without angina pectoris: Secondary | ICD-10-CM | POA: Diagnosis not present

## 2018-05-19 DIAGNOSIS — Z6837 Body mass index (BMI) 37.0-37.9, adult: Secondary | ICD-10-CM | POA: Diagnosis not present

## 2018-05-19 DIAGNOSIS — Z299 Encounter for prophylactic measures, unspecified: Secondary | ICD-10-CM | POA: Diagnosis not present

## 2018-05-19 DIAGNOSIS — F17210 Nicotine dependence, cigarettes, uncomplicated: Secondary | ICD-10-CM | POA: Diagnosis not present

## 2018-05-19 DIAGNOSIS — I10 Essential (primary) hypertension: Secondary | ICD-10-CM | POA: Diagnosis not present

## 2018-05-19 DIAGNOSIS — M545 Low back pain: Secondary | ICD-10-CM | POA: Diagnosis not present

## 2018-05-21 ENCOUNTER — Other Ambulatory Visit (HOSPITAL_COMMUNITY): Payer: Self-pay | Admitting: Nurse Practitioner

## 2018-05-21 DIAGNOSIS — G8929 Other chronic pain: Secondary | ICD-10-CM

## 2018-05-21 DIAGNOSIS — M545 Low back pain: Principal | ICD-10-CM

## 2018-05-25 ENCOUNTER — Ambulatory Visit (HOSPITAL_COMMUNITY)
Admission: RE | Admit: 2018-05-25 | Discharge: 2018-05-25 | Disposition: A | Discharge status: Home / Self Care | Payer: Medicare Other | Source: Ambulatory Visit | Attending: Nurse Practitioner | Admitting: Nurse Practitioner

## 2018-05-25 DIAGNOSIS — M5126 Other intervertebral disc displacement, lumbar region: Secondary | ICD-10-CM | POA: Insufficient documentation

## 2018-05-25 DIAGNOSIS — G8929 Other chronic pain: Secondary | ICD-10-CM

## 2018-05-25 DIAGNOSIS — M545 Low back pain: Secondary | ICD-10-CM | POA: Diagnosis not present

## 2018-05-25 DIAGNOSIS — M48061 Spinal stenosis, lumbar region without neurogenic claudication: Secondary | ICD-10-CM | POA: Insufficient documentation

## 2018-06-04 ENCOUNTER — Other Ambulatory Visit (HOSPITAL_COMMUNITY)
Admission: RE | Admit: 2018-06-04 | Discharge: 2018-06-04 | Disposition: A | Discharge status: Home / Self Care | Payer: Medicare Other | Source: Ambulatory Visit | Attending: Cardiology | Admitting: Cardiology

## 2018-06-04 DIAGNOSIS — I10 Essential (primary) hypertension: Secondary | ICD-10-CM | POA: Diagnosis not present

## 2018-06-04 DIAGNOSIS — I2510 Atherosclerotic heart disease of native coronary artery without angina pectoris: Secondary | ICD-10-CM | POA: Insufficient documentation

## 2018-06-04 DIAGNOSIS — F17210 Nicotine dependence, cigarettes, uncomplicated: Secondary | ICD-10-CM | POA: Diagnosis not present

## 2018-06-04 DIAGNOSIS — R7309 Other abnormal glucose: Secondary | ICD-10-CM | POA: Diagnosis not present

## 2018-06-04 DIAGNOSIS — E039 Hypothyroidism, unspecified: Secondary | ICD-10-CM | POA: Insufficient documentation

## 2018-06-04 DIAGNOSIS — E7800 Pure hypercholesterolemia, unspecified: Secondary | ICD-10-CM | POA: Diagnosis not present

## 2018-06-04 DIAGNOSIS — Z6836 Body mass index (BMI) 36.0-36.9, adult: Secondary | ICD-10-CM | POA: Diagnosis not present

## 2018-06-04 DIAGNOSIS — E785 Hyperlipidemia, unspecified: Secondary | ICD-10-CM | POA: Insufficient documentation

## 2018-06-04 DIAGNOSIS — Z299 Encounter for prophylactic measures, unspecified: Secondary | ICD-10-CM | POA: Diagnosis not present

## 2018-06-04 DIAGNOSIS — M4800 Spinal stenosis, site unspecified: Secondary | ICD-10-CM | POA: Diagnosis not present

## 2018-06-04 LAB — CBC
HEMATOCRIT: 47.2 % (ref 39.0–52.0)
Hemoglobin: 15 g/dL (ref 13.0–17.0)
MCH: 29 pg (ref 26.0–34.0)
MCHC: 31.8 g/dL (ref 30.0–36.0)
MCV: 91.1 fL (ref 78.0–100.0)
PLATELETS: 156 10*3/uL (ref 150–400)
RBC: 5.18 MIL/uL (ref 4.22–5.81)
RDW: 15.7 % — AB (ref 11.5–15.5)
WBC: 6.6 10*3/uL (ref 4.0–10.5)

## 2018-06-04 LAB — COMPREHENSIVE METABOLIC PANEL
ALT: 18 U/L (ref 0–44)
ANION GAP: 7 (ref 5–15)
AST: 16 U/L (ref 15–41)
Albumin: 3.8 g/dL (ref 3.5–5.0)
Alkaline Phosphatase: 63 U/L (ref 38–126)
BUN: 25 mg/dL — ABNORMAL HIGH (ref 8–23)
CALCIUM: 9.8 mg/dL (ref 8.9–10.3)
CHLORIDE: 100 mmol/L (ref 98–111)
CO2: 29 mmol/L (ref 22–32)
CREATININE: 1.23 mg/dL (ref 0.61–1.24)
GFR, EST NON AFRICAN AMERICAN: 52 mL/min — AB (ref >60)
Glucose, Bld: 106 mg/dL — ABNORMAL HIGH (ref 70–99)
Potassium: 4.3 mmol/L (ref 3.5–5.1)
SODIUM: 136 mmol/L (ref 135–145)
Total Bilirubin: 1.3 mg/dL — ABNORMAL HIGH (ref 0.3–1.2)
Total Protein: 7.6 g/dL (ref 6.5–8.1)

## 2018-06-04 LAB — LIPID PANEL
CHOLESTEROL: 195 mg/dL (ref 0–200)
HDL: 45 mg/dL (ref >40)
LDL Cholesterol: 127 mg/dL — ABNORMAL HIGH (ref 0–99)
Total CHOL/HDL Ratio: 4.3 RATIO
Triglycerides: 116 mg/dL (ref <150)
VLDL: 23 mg/dL (ref 0–40)

## 2018-06-04 LAB — MAGNESIUM: MAGNESIUM: 2.1 mg/dL (ref 1.7–2.4)

## 2018-06-04 LAB — HEMOGLOBIN A1C
HEMOGLOBIN A1C: 6.5 % — AB (ref 4.8–5.6)
MEAN PLASMA GLUCOSE: 139.85 mg/dL

## 2018-06-04 LAB — TSH: TSH: 9.125 u[IU]/mL — ABNORMAL HIGH (ref 0.350–4.500)

## 2018-06-07 DIAGNOSIS — R69 Illness, unspecified: Secondary | ICD-10-CM | POA: Diagnosis not present

## 2018-06-15 ENCOUNTER — Telehealth: Payer: Self-pay | Admitting: *Deleted

## 2018-06-15 NOTE — Telephone Encounter (Signed)
Pt voiced understanding but declines lipid clinic at this time. Says he back has been injured and is seeing neuro on 7/30 and will call us back after that for referral. Routed to pcp

## 2018-06-15 NOTE — Telephone Encounter (Signed)
-----   Message from Arnoldo Lenis, MD sent at 06/15/2018 12:30 PM EDT ----- Labs overall look good except thryoid remains low, please forward to pcp. Cholesterol remains elevated, does not look like zetia alone is brining it down. If he is willing to consider the cholesterol medicine injection I would recommend we refer him to lipid clinic to be considered for this   Zandra Abts MD

## 2018-06-23 DIAGNOSIS — Z6825 Body mass index (BMI) 25.0-25.9, adult: Secondary | ICD-10-CM | POA: Diagnosis not present

## 2018-06-23 DIAGNOSIS — I10 Essential (primary) hypertension: Secondary | ICD-10-CM | POA: Diagnosis not present

## 2018-06-23 DIAGNOSIS — M48062 Spinal stenosis, lumbar region with neurogenic claudication: Secondary | ICD-10-CM | POA: Diagnosis not present

## 2018-07-06 DIAGNOSIS — M48062 Spinal stenosis, lumbar region with neurogenic claudication: Secondary | ICD-10-CM | POA: Diagnosis not present

## 2018-07-06 DIAGNOSIS — Z6825 Body mass index (BMI) 25.0-25.9, adult: Secondary | ICD-10-CM | POA: Diagnosis not present

## 2018-07-06 DIAGNOSIS — I10 Essential (primary) hypertension: Secondary | ICD-10-CM | POA: Diagnosis not present

## 2018-07-28 DIAGNOSIS — M48062 Spinal stenosis, lumbar region with neurogenic claudication: Secondary | ICD-10-CM | POA: Diagnosis not present

## 2018-08-28 ENCOUNTER — Encounter (HOSPITAL_COMMUNITY): Payer: Self-pay | Admitting: Emergency Medicine

## 2018-08-28 ENCOUNTER — Other Ambulatory Visit: Payer: Self-pay

## 2018-08-28 ENCOUNTER — Emergency Department (HOSPITAL_COMMUNITY): Payer: Medicare Other

## 2018-08-28 ENCOUNTER — Emergency Department (HOSPITAL_COMMUNITY)
Admission: EM | Admit: 2018-08-28 | Discharge: 2018-08-28 | Disposition: A | Discharge status: Home / Self Care | Payer: Medicare Other | Attending: Emergency Medicine | Admitting: Emergency Medicine

## 2018-08-28 DIAGNOSIS — F17210 Nicotine dependence, cigarettes, uncomplicated: Secondary | ICD-10-CM | POA: Diagnosis not present

## 2018-08-28 DIAGNOSIS — S40012A Contusion of left shoulder, initial encounter: Secondary | ICD-10-CM | POA: Insufficient documentation

## 2018-08-28 DIAGNOSIS — I10 Essential (primary) hypertension: Secondary | ICD-10-CM | POA: Diagnosis not present

## 2018-08-28 DIAGNOSIS — Y93H2 Activity, gardening and landscaping: Secondary | ICD-10-CM | POA: Insufficient documentation

## 2018-08-28 DIAGNOSIS — I2510 Atherosclerotic heart disease of native coronary artery without angina pectoris: Secondary | ICD-10-CM | POA: Diagnosis not present

## 2018-08-28 DIAGNOSIS — W010XXA Fall on same level from slipping, tripping and stumbling without subsequent striking against object, initial encounter: Secondary | ICD-10-CM | POA: Diagnosis not present

## 2018-08-28 DIAGNOSIS — Z79899 Other long term (current) drug therapy: Secondary | ICD-10-CM | POA: Insufficient documentation

## 2018-08-28 DIAGNOSIS — Y929 Unspecified place or not applicable: Secondary | ICD-10-CM | POA: Insufficient documentation

## 2018-08-28 DIAGNOSIS — S4992XA Unspecified injury of left shoulder and upper arm, initial encounter: Secondary | ICD-10-CM | POA: Diagnosis not present

## 2018-08-28 DIAGNOSIS — Z7982 Long term (current) use of aspirin: Secondary | ICD-10-CM | POA: Diagnosis not present

## 2018-08-28 DIAGNOSIS — Y999 Unspecified external cause status: Secondary | ICD-10-CM | POA: Insufficient documentation

## 2018-08-28 DIAGNOSIS — S50311A Abrasion of right elbow, initial encounter: Secondary | ICD-10-CM | POA: Diagnosis not present

## 2018-08-28 DIAGNOSIS — M25512 Pain in left shoulder: Secondary | ICD-10-CM | POA: Diagnosis not present

## 2018-08-28 NOTE — ED Notes (Signed)
Going to xray

## 2018-08-28 NOTE — ED Notes (Signed)
Pt has skin tear to right arm. Cleaned and dressed with telfa.

## 2018-08-28 NOTE — ED Provider Notes (Signed)
[facility] EMERGENCY DEPARTMENT Provider Note   CSN: [id number] Arrival date & time: 08/28/18  1310     History   Chief Complaint Chief Complaint  Patient presents with  . Fall    HPI Gary Odonnell is a 84 y.o. male.  Patient is an 84 year old male who presents to the emergency department with left shoulder injury following a fall.  The patient states that he was doing some trimming at his home.  He stumbled over the tongue of the trailer he was pulling with his lawnmower.  He fell and injured the left shoulder.  He also had some skin tears on the right elbow.  He denies hitting his head, injuring his neck, injuring his chest, or his pelvis.  No lower extremity injury reported.  Patient denies being on any anticoagulation medications at this time.  The history is provided by the patient and the spouse.  Fall  Pertinent negatives include no chest pain, no abdominal pain and no shortness of breath.    Past Medical History:  Diagnosis Date  . Cancer ([place])    L lung  . Coronary artery disease   . Hypercholesteremia   . Hypertension   . PAD (peripheral artery disease) ([place])   . Pneumonia 09/2013  . Radiation april 2016  . Solitary pulmonary nodule on lung CT 12/13/13   11/15/14  . Stroke [facility]) 11/2008    Patient Active Problem List   Diagnosis Date Noted  . Iliac artery stenosis, left ([place]) 04/22/2015  . PAD (peripheral artery disease) ([place]) 04/21/2015  . Primary cancer of left lower lobe of lung ([place]) 02/02/2015  . Abnormal PET scan of colon 01/04/2015  . Solitary pulmonary nodule on lung CT   . Carpal tunnel syndrome, left 05/03/2014  . Acute renal failure ([place]) 09/27/2013  . HTN (hypertension) 09/26/2013  . CHF (congestive heart failure) ([place]) 09/26/2013  . CVA (cerebral infarction) 09/26/2013  . CAP (community acquired pneumonia) 09/26/2013  . CAD (coronary atherosclerotic disease) 09/26/2013  . S/P CABG x 4 09/26/2013    Past Surgical History:  Procedure  Laterality Date  . Angioplasty to Left femoral artery    . CARPAL TUNNEL RELEASE Left 05/11/2014   Procedure: LEFT CARPAL TUNNEL RELEASE;  Surgeon: Carole Civil, MD;  Location: [facility];  Service: Orthopedics;  Laterality: Left;  . COLONOSCOPY N/A 01/24/2015   Procedure: COLONOSCOPY;  Surgeon: Danie Binder, MD;  Location[facility];  Service: Endoscopy;  Laterality: N/A;  130  . CORONARY ARTERY BYPASS GRAFT     x4  . FEMORAL-POPLITEAL BYPASS GRAFT Right 10/23/2015   Procedure: BYPASS GRAFT FEMORAL TO BELOW KNEE POPLITEAL ARTERY USING RIGHT NON-REVERSED GREATER SAPPHENOUS VEIN;  Surgeon: [name], MD;  Location: [place];  Service: Vascular;  Laterality: Right;  . INTRAOPERATIVE ARTERIOGRAM Right 10/23/2015   Procedure: INTRA OPERATIVE ARTERIOGRAM - RIGHT LOWER LEG;  Surgeon: [name], MD;  Location: [place];  Service: Vascular;  Laterality: Right;  . KNEE ARTHROSCOPY Right   . LEFT AND RIGHT HEART CATHETERIZATION WITH CORONARY ANGIOGRAM N/A 09/29/2013   Procedure: LEFT AND RIGHT HEART CATHETERIZATION WITH CORONARY ANGIOGRAM;  Surgeon: Burnell Blanks, MD;  Location: [facility] CATH LAB;  Service: Cardiovascular;  Laterality: N/A;  . PERIPHERAL VASCULAR CATHETERIZATION N/A 04/21/2015   Procedure: Abdominal Aortogram w/Lower Extremity;  Surgeon: [name], MD;  Location: [place] CV LAB;  Service: Cardiovascular;  Laterality: N/A;  . Stent to right femoral artery    .  TRIGGER FINGER RELEASE Bilateral   . VEIN HARVEST Right 10/23/2015   Procedure: [place];  Surgeon: [name], MD;  Location: [place];  Service: Vascular;  Laterality: Right;        Home Medications    Prior to Admission medications   Medication Sig Start Date End Date Taking? Authorizing Provider  amLODipine (NORVASC) 5 MG tablet Take 5 mg by mouth daily.    [provider]  aspirin 81 MG EC tablet Take 81 mg by mouth daily.    [provider]    carvedilol (COREG) 25 MG tablet TAKE ONE-HALF (1/2) TABLET TWICE A DAY WITH MEALS 01/26/18   Arnoldo Lenis, MD  ezetimibe (ZETIA) 10 MG tablet TAKE 1 TABLET DAILY 03/19/18   Arnoldo Lenis, MD  potassium chloride (K-DUR,KLOR-CON) 10 MEQ tablet Take 1 tablet (10 mEq total) by mouth daily. 10/01/13   Barrett, Evelene Croon, PA-C  sacubitril-valsartan (ENTRESTO) 49-51 MG Take 1 tablet by mouth 2 (two) times daily. 05/12/18   Arnoldo Lenis, MD    Family History Family History  Problem Relation Age of Onset  . Heart attack Father   . Hyperlipidemia Father   . Diabetes Mother   . Diabetes Brother   . Colon cancer Neg Hx     Social History Social History   Tobacco Use  . Smoking status: Current Every Day Smoker    Packs/day: 1.00    Years: 60.00    Pack years: 60.00    Types: Cigars    Start date: 11/21/1949  . Smokeless tobacco: Never Used  . Tobacco comment: former cigarette smoker/smoked cigarettes for 30 years  Substance Use Topics  . Alcohol use: No    Alcohol/week: 0.0 standard drinks  . Drug use: No     Allergies   Patient has no known allergies.   Review of Systems Review of Systems  Constitutional: Negative for activity change.       All ROS Neg except as noted in HPI  HENT: Negative for nosebleeds.   Eyes: Negative for photophobia and discharge.  Respiratory: Negative for cough, shortness of breath and wheezing.   Cardiovascular: Negative for chest pain and palpitations.  Gastrointestinal: Negative for abdominal pain and blood in stool.  Genitourinary: Negative for dysuria, frequency and hematuria.  Musculoskeletal: Positive for arthralgias and back pain. Negative for neck pain.  Skin: Negative.   Neurological: Negative for dizziness, seizures and speech difficulty.  Psychiatric/Behavioral: Negative for confusion and hallucinations.     Physical Exam Updated Vital Signs BP (!) 152/77 (BP Location: Right Arm)   Pulse 60   Temp (!) 97.5 F (36.4 C)  (Oral)   Resp 18   Ht 5' 11.5" (1.816 m)   Wt 86.2 kg   SpO2 95%   BMI 26.13 kg/m   Physical Exam  Constitutional: He is oriented to person, place, and time. He appears well-developed and well-nourished.  Non-toxic appearance.  HENT:  Head: Normocephalic.  Right Ear: Tympanic membrane and external ear normal.  Left Ear: Tympanic membrane and external ear normal.  No scalp hematoma.  No facial hematoma or abrasions appreciated.  Eyes: Pupils are equal, round, and reactive to light. EOM and lids are normal.  Neck: Normal range of motion. Neck supple. Carotid bruit is not present.  Cardiovascular: Normal rate, regular rhythm, normal heart sounds, intact distal pulses and normal pulses.  Pulmonary/Chest: Breath sounds normal. No respiratory distress.  No chest wall tenderness appreciated.  There is symmetrical rise and fall of the chest.  Abdominal: Soft. Bowel sounds are normal. There is no tenderness. There is no guarding.  Musculoskeletal: Normal range of motion.  There is no pain or deformity of the left clavicle.  There is pain with attempted range of motion of the left shoulder.  There is no deformity of the scapula.  There is no deformity of the humerus.  There is soreness of the anterior humeral head area.  There is good range of motion of the left elbow.  There is no evidence of any dislocation.  There is minimal soreness of the forearm on the left.  The patient had stiffness of the right wrist and hand prior to the fall, he states that no nothing is changed concerning his wrist and hand.  Capillary refill is less than 3 seconds.  Radial pulses 2+ on the left.  There is full range of motion on the right upper extremity.  There is abrasion/skin tear at the posterior elbow area on the right.  There is full range of motion of the lower extremities without problem.  Lymphadenopathy:       Head (right side): No submandibular adenopathy present.       Head (left side): No submandibular  adenopathy present.    He has no cervical adenopathy.  Neurological: He is alert and oriented to person, place, and time. He has normal strength. No cranial nerve deficit or sensory deficit.  Skin: Skin is warm and dry.  Psychiatric: He has a normal mood and affect. His speech is normal.  Nursing note and vitals reviewed.    ED Treatments / Results  Labs (all labs ordered are listed, but only abnormal results are displayed) Labs Reviewed - No data to display  EKG None  Radiology Dg Shoulder Left  Result Date: 08/28/2018 CLINICAL DATA:  Left shoulder pain due over a trailer hitch to a trip and fall today. Initial encounter. EXAM: LEFT SHOULDER - 2+ VIEW COMPARISON:  None. FINDINGS: There is no evidence of fracture or dislocation. There is no evidence of arthropathy or other focal bone abnormality. Soft tissues are unremarkable. IMPRESSION: Negative exam. Electronically Signed   By: Inge Rise M.D.   On: 08/28/2018 14:19    Procedures Procedures (including critical care time)  Medications Ordered in ED Medications - No data to display   Initial Impression / Assessment and Plan / ED Course  I have reviewed the triage vital signs and the nursing notes.  Pertinent labs & imaging results that were available during my care of the patient were reviewed by me and considered in my medical decision making (see chart for details).       Final Clinical Impressions(s) / ED Diagnoses MDM  Vital signs are within normal limits.  Pulse oximetry is 95% on room air.  Within normal limits by my interpretation.  The patient has an abrasion of the right elbow.  Has pain with attempted movement of the left shoulder.  X-ray of the shoulder is negative for fracture or dislocation.  The patient has no pain with breathing, no difficulty with speaking in complete sentences.  No problem with the pelvis or lower extremities.  The patient has his own sling, and will use a sling until he can be  seen by orthopedics.  Pain medication was offered, he says he had a bad reaction to pain medication and request to use only Tylenol for his discomfort.  The patient will see Dr. Aline Brochure for orthopedic  evaluation concerning the shoulder.  Patient will return to the emergency department if any changes in condition, problems, or concerns.   Final diagnoses:  Contusion of left shoulder, initial encounter  Abrasion of right elbow, initial encounter    ED Discharge Orders    None       Lily Kocher, PA-C 08/28/18 1627    Milton Ferguson, MD 09/01/18 1558

## 2018-08-28 NOTE — ED Notes (Signed)
Patient given discharge instruction, verbalized understand wheelchair out of the department.

## 2018-08-28 NOTE — ED Triage Notes (Signed)
Pt tripped over his trailer today, falling on his left shoulder. Denies hitting his head. Bleeding controlled. Skin tear to his right arm.

## 2018-08-28 NOTE — Discharge Instructions (Addendum)
Please cleanse the wound to the elbow with soap and water, and apply a nonstick dressing daily until the wound heals.  The x-ray of your left shoulder is negative for fracture, or dislocation.  Your examination shows no evidence of neurologic or vascular deficit.  Please call Dr. Aline Brochure for orthopedic evaluation, and to evaluate for any possible rotator cuff injury to your left shoulder.  Use Tylenol extra strength every 4 hours for pain.  Please see your primary physician or return to the emergency department if any pain control is needed.

## 2018-09-01 ENCOUNTER — Ambulatory Visit (INDEPENDENT_AMBULATORY_CARE_PROVIDER_SITE_OTHER): Payer: Medicare Other | Admitting: Orthopaedic Surgery

## 2018-09-01 ENCOUNTER — Encounter: Payer: Self-pay | Admitting: Orthopaedic Surgery

## 2018-09-01 VITALS — BP 141/67 | HR 69 | Ht 71.5 in | Wt 193.0 lb

## 2018-09-01 DIAGNOSIS — M25512 Pain in left shoulder: Secondary | ICD-10-CM

## 2018-09-01 DIAGNOSIS — I2510 Atherosclerotic heart disease of native coronary artery without angina pectoris: Secondary | ICD-10-CM | POA: Diagnosis not present

## 2018-09-01 NOTE — Progress Notes (Signed)
Subjective:    Patient ID: Gary Odonnell, male    DOB: [birth year], 84 y.o.   MRN: [id number]  HPI He had been mowing his yard.  He had his dog with him and on a leash.  He walked his dog with the leash.  The dog took off after a squirrel and pulled him over the lawnmower.  He hurt his left shoulder and left upper arm.  This happened 08-28-18.  He was seen in the ER.  X-rays were negative of the left shoulder.  He was given a sling.  He could not use the sling as it made him worse.  He has bruising of the left upper arm.  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  He has residual swelling of the hand and numbness.  He cannot take most pain pills as it makes him sleep for days.  He has used ice.   Review of Systems  Constitutional: Positive for activity change.  Respiratory: Positive for shortness of breath.   Musculoskeletal: Positive for arthralgias, gait problem and joint swelling.  Neurological: Positive for weakness and numbness.  All other systems reviewed and are negative.  For Review of Systems, all other systems reviewed and are negative.  The following is a summary of the past history medically, past history surgically, known current medicines, social history and family history.  This information is gathered electronically by the computer from prior information and documentation.  I review this each visit and have found including this information at this point in the chart is beneficial and informative.   Past Medical History:  Diagnosis Date  . Cancer ([place])    L lung  . Coronary artery disease   . Hypercholesteremia   . Hypertension   . PAD (peripheral artery disease) ([place])   . Pneumonia 09/2013  . Radiation april 2016  . Solitary pulmonary nodule on lung CT 12/13/13   11/15/14  . Stroke [facility]) 11/2008    Past Surgical History:  Procedure Laterality Date  . Angioplasty to Left femoral artery    . CARPAL TUNNEL RELEASE Left 05/11/2014   Procedure: LEFT CARPAL TUNNEL RELEASE;  Surgeon: Carole Civil, MD;  Location: [facility] OR[facility];  Service: Orthopedics;  Laterality: Left;  . COLONOSCOPY N/A 01/24/2015   Procedure: COLONOSCOPY;  Surgeon: Danie Binder, MD;  [facility] END[facility];  Service: Endoscopy;  Laterality: N/A;  130  . CORONARY ARTERY BYPASS GRAFT     x4  . FEMORAL-POPLITEAL BYPASS GRAFT Right 10/23/2015   Procedure: BYPASS GRAFT FEMORAL TO BELOW KNEE POPLITEAL ARTERY USING RIGHT NON-REVERSED GREATER SAPPHENOUS VEIN;  Surgeon: [name], MD;  Location: [place];  Service: Vascular;  Laterality: Right;  . INTRAOPERATIVE ARTERIOGRAM Right 10/23/2015   Procedure: INTRA OPERATIVE ARTERIOGRAM - RIGHT LOWER LEG;  Surgeon: [name], MD;  Location: [place];  Service: Vascular;  Laterality: Right;  . KNEE ARTHROSCOPY Right   . LEFT AND RIGHT HEART CATHETERIZATION WITH CORONARY ANGIOGRAM N/A 09/29/2013   Procedure: LEFT AND RIGHT HEART CATHETERIZATION WITH CORONARY ANGIOGRAM;  Surgeon: Burnell Blanks, MD;  Location: [facility] CATH LAB;  Service: Cardiovascular;  Laterality: N/A;  . PERIPHERAL VASCULAR CATHETERIZATION N/A 04/21/2015   Procedure: Abdominal Aortogram w/Lower Extremity;  Surgeon: [name], MD;  Location: [place] CV LAB;  Service: Cardiovascular;  Laterality: N/A;  . Stent to right femoral artery    . TRIGGER FINGER RELEASE Bilateral   . VEIN HARVEST  Right 10/23/2015   Procedure: VEIN HARVEST - RIGHT GREATER SAPPHENOUS;  Surgeon: [name], MD;  Location: [facility] OR;  Service: Vascular;  Laterality: Right;    Current Outpatient Medications on File Prior to Visit  Medication Sig Dispense Refill  . amLODipine (NORVASC) 5 MG tablet Take 5 mg by mouth daily.    Marland Kitchen aspirin 81 MG EC tablet Take 81 mg by mouth daily.    . carvedilol (COREG) 25 MG tablet TAKE ONE-HALF (1/2) TABLET TWICE A DAY WITH MEALS 90 tablet 3  . ezetimibe (ZETIA) 10 MG tablet TAKE 1 TABLET DAILY 90 tablet 1  . potassium chloride  (K-DUR,KLOR-CON) 10 MEQ tablet Take 1 tablet (10 mEq total) by mouth daily. 30 tablet 11  . sacubitril-valsartan (ENTRESTO) 49-51 MG Take 1 tablet by mouth 2 (two) times daily. 180 tablet 3  . traMADol (ULTRAM) 50 MG tablet TK 1 T PO Q 8 H PRN P  0   No current facility-administered medications on file prior to visit.     Social History   Socioeconomic History  . Marital status: Married    Spouse name: Not on file  . Number of children: Not on file  . Years of education: Not on file  . Highest education level: Not on file  Occupational History  . Not on file  Social Needs  . Financial resource strain: Not on file  . Food insecurity:    Worry: Not on file    Inability: Not on file  . Transportation needs:    Medical: Not on file    Non-medical: Not on file  Tobacco Use  . Smoking status: Current Every Day Smoker    Packs/day: 1.00    Years: 60.00    Pack years: 60.00    Types: Cigars    Start date: 11/21/1949  . Smokeless tobacco: Never Used  . Tobacco comment: former cigarette smoker/smoked cigarettes for 30 years  Substance and Sexual Activity  . Alcohol use: No    Alcohol/week: 0.0 standard drinks  . Drug use: No  . Sexual activity: Not on file  Lifestyle  . Physical activity:    Days per week: Not on file    Minutes per session: Not on file  . Stress: Not on file  Relationships  . Social connections:    Talks on phone: Not on file    Gets together: Not on file    Attends religious service: Not on file    Active member of club or organization: Not on file    Attends meetings of clubs or organizations: Not on file    Relationship status: Not on file  . Intimate partner violence:    Fear of current or ex partner: Not on file    Emotionally abused: Not on file    Physically abused: Not on file    Forced sexual activity: Not on file  Other Topics Concern  . Not on file  Social History Narrative  . Not on file    Family History  Problem Relation Age of  Onset  . Heart attack Father   . Hyperlipidemia Father   . Diabetes Mother   . Diabetes Brother   . Colon cancer Neg Hx     BP (!) 141/67   Pulse 69   Ht 5' 11.5" (1.816 m)   Wt 193 lb (87.5 kg)   BMI 26.54 kg/m   Body mass index is 26.54 kg/m.     Objective:   Physical Exam  Constitutional: He is oriented to person, place, and time. He appears well-developed and well-nourished.  HENT:  Head: Normocephalic and atraumatic.  Eyes: Pupils are equal, round, and reactive to light. Conjunctivae and EOM are normal.  Neck: Normal range of motion. Neck supple.  Cardiovascular: Normal rate, regular rhythm and intact distal pulses.  Pulmonary/Chest: Effort normal.  Abdominal: Soft.  Musculoskeletal:       Left shoulder: He exhibits decreased range of motion, tenderness and swelling.       Arms: Neurological: He is alert and oriented to person, place, and time. He has normal reflexes. He displays normal reflexes. No cranial nerve deficit. He exhibits normal muscle tone. Coordination normal.  Skin: Skin is warm and dry.  Psychiatric: He has a normal mood and affect. His behavior is normal. Judgment and thought content normal.          Assessment & Plan:   Encounter Diagnosis  Name Primary?  . Acute pain of left shoulder Yes   PROCEDURE NOTE:  The patient request injection, verbal consent was obtained.  The left shoulder was prepped appropriately after time out was performed.   Sterile technique was observed and injection of 1 cc of Depo-Medrol 40 mg with several cc's of plain xylocaine. Anesthesia was provided by ethyl chloride and a 20-gauge needle was used to inject the shoulder area. A posterior approach was used.  The injection was tolerated well.  A band aid dressing was applied.  The patient was advised to apply ice later today and tomorrow to the injection sight as needed.  He is to use his sling.  He is to use ice, Advil or Tylenol.  Sleep  semi-erect.  Return in one week.  X-rays of the left shoulder on return.  Call if any problem.  Precautions discussed.   Electronically Signed Sanjuana Kava, MD 10/8/201910:44 AM

## 2018-09-08 ENCOUNTER — Ambulatory Visit (INDEPENDENT_AMBULATORY_CARE_PROVIDER_SITE_OTHER): Payer: Medicare Other

## 2018-09-08 ENCOUNTER — Encounter: Payer: Self-pay | Admitting: Orthopaedic Surgery

## 2018-09-08 ENCOUNTER — Ambulatory Visit (INDEPENDENT_AMBULATORY_CARE_PROVIDER_SITE_OTHER): Payer: Medicare Other | Admitting: Orthopaedic Surgery

## 2018-09-08 VITALS — BP 151/51 | HR 80 | Ht 71.5 in | Wt 193.0 lb

## 2018-09-08 DIAGNOSIS — F17210 Nicotine dependence, cigarettes, uncomplicated: Secondary | ICD-10-CM | POA: Diagnosis not present

## 2018-09-08 DIAGNOSIS — S42295A Other nondisplaced fracture of upper end of left humerus, initial encounter for closed fracture: Secondary | ICD-10-CM | POA: Diagnosis not present

## 2018-09-08 DIAGNOSIS — M25512 Pain in left shoulder: Secondary | ICD-10-CM

## 2018-09-08 DIAGNOSIS — I2510 Atherosclerotic heart disease of native coronary artery without angina pectoris: Secondary | ICD-10-CM

## 2018-09-08 NOTE — Progress Notes (Signed)
Patient Gary Odonnell, male DOB:01-Mar-1932, 84 y.o. HER:[id number]  Chief Complaint  Patient presents with  . Shoulder Pain    left   . Arm Pain    upper arm, into left elbow    HPI  Gary Odonnell is a 84 y.o. male who has more pain of the left shoulder.  He has resolving ecchymosis.  He has no new trauma.  X-rays were done here today and it shows a nondisplaced fracture of the proximal humerus not seen on original x-rays done in the ER.  I have informed him and his wife of the findings.  He says pain medicine makes him sleep and sleep and he will take Tylenol.   Body mass index is 26.54 kg/m.  ROS  Review of Systems  Constitutional: Positive for activity change.  Respiratory: Positive for shortness of breath.   Musculoskeletal: Positive for arthralgias, gait problem and joint swelling.  Neurological: Positive for weakness and numbness.  All other systems reviewed and are negative.   All other systems reviewed and are negative.  The following is a summary of the past history medically, past history surgically, known current medicines, social history and family history.  This information is gathered electronically by the computer from prior information and documentation.  I review this each visit and have found including this information at this point in the chart is beneficial and informative.    Past Medical History:  Diagnosis Date  . Cancer ([place])    L lung  . Coronary artery disease   . Hypercholesteremia   . Hypertension   . PAD (peripheral artery disease) ([place])   . Pneumonia 09/2013  . Radiation april 2016  . Solitary pulmonary nodule on lung CT 12/13/13   11/15/14  . Stroke [facility]) 11/2008    Past Surgical History:  Procedure Laterality Date  . Angioplasty to Left femoral artery    . CARPAL TUNNEL RELEASE Left 05/11/2014   Procedure: LEFT CARPAL TUNNEL RELEASE;  Surgeon: Carole Civil, MD;  Location: [facility];  Service: Orthopedics;  Laterality: Left;  .  COLONOSCOPY N/A 01/24/2015   Procedure: COLONOSCOPY;  Surgeon: Danie Binder, MD;  [facility];  Service: Endoscopy;  Laterality: N/A;  130  . CORONARY ARTERY BYPASS GRAFT     x4  . FEMORAL-POPLITEAL BYPASS GRAFT Right 10/23/2015   Procedure: BYPASS GRAFT FEMORAL TO BELOW KNEE POPLITEAL ARTERY USING RIGHT NON-REVERSED GREATER SAPPHENOUS VEIN;  Surgeon: [name], MD;  Location: [place];  Service: Vascular;  Laterality: Right;  . INTRAOPERATIVE ARTERIOGRAM Right 10/23/2015   Procedure: INTRA OPERATIVE ARTERIOGRAM - RIGHT LOWER LEG;  Surgeon: [name], MD;  Location: [place];  Service: Vascular;  Laterality: Right;  . KNEE ARTHROSCOPY Right   . LEFT AND RIGHT HEART CATHETERIZATION WITH CORONARY ANGIOGRAM N/A 09/29/2013   Procedure: LEFT AND RIGHT HEART CATHETERIZATION WITH CORONARY ANGIOGRAM;  Surgeon: Burnell Blanks, MD;  Location: [facility] CATH LAB;  Service: Cardiovascular;  Laterality: N/A;  . PERIPHERAL VASCULAR CATHETERIZATION N/A 04/21/2015   Procedure: Abdominal Aortogram w/Lower Extremity;  Surgeon: [name], MD;  Location: [place] CV LAB;  Service: Cardiovascular;  Laterality: N/A;  . Stent to right femoral artery    . TRIGGER FINGER RELEASE Bilateral   . VEIN HARVEST Right 10/23/2015   Procedure: [place];  Surgeon: [name], MD;  [facility] OR;  Service: Vascular;  Laterality: Right;    Family History  Problem Relation  Age of Onset  . Heart attack Father   . Hyperlipidemia Father   . Diabetes Mother   . Diabetes Brother   . Colon cancer Neg Hx     Social History Social History   Tobacco Use  . Smoking status: Current Every Day Smoker    Packs/day: 1.00    Years: 60.00    Pack years: 60.00    Types: Cigars    Start date: 11/21/1949  . Smokeless tobacco: Never Used  . Tobacco comment: former cigarette smoker/smoked cigarettes for 30 years  Substance Use Topics  . Alcohol use: No     Alcohol/week: 0.0 standard drinks  . Drug use: No    No Known Allergies  Current Outpatient Medications  Medication Sig Dispense Refill  . amLODipine (NORVASC) 5 MG tablet Take 5 mg by mouth daily.    Marland Kitchen aspirin 81 MG EC tablet Take 81 mg by mouth daily.    . carvedilol (COREG) 25 MG tablet TAKE ONE-HALF (1/2) TABLET TWICE A DAY WITH MEALS 90 tablet 3  . ezetimibe (ZETIA) 10 MG tablet TAKE 1 TABLET DAILY 90 tablet 1  . potassium chloride (K-DUR,KLOR-CON) 10 MEQ tablet Take 1 tablet (10 mEq total) by mouth daily. 30 tablet 11  . sacubitril-valsartan (ENTRESTO) 49-51 MG Take 1 tablet by mouth 2 (two) times daily. 180 tablet 3  . traMADol (ULTRAM) 50 MG tablet TK 1 T PO Q 8 H PRN P  0   No current facility-administered medications for this visit.      Physical Exam  Blood pressure (!) 151/51, pulse 80, height 5' 11.5" (1.816 m), weight 193 lb (87.5 kg).  Constitutional: overall normal hygiene, normal nutrition, well developed, normal grooming, normal body habitus. Assistive device:none  Musculoskeletal: gait and station Limp none, muscle tone and strength are normal, no tremors or atrophy is present.  .  Neurological: coordination overall normal.  Deep tendon reflex/nerve stretch intact.  Sensation normal.  Cranial nerves II-XII intact.   Skin:   Normal overall no scars, lesions, ulcers or rashes. No psoriasis.  Psychiatric: Alert and oriented x 3.  Recent memory intact, remote memory unclear.  Normal mood and affect. Well groomed.  Good eye contact.  Cardiovascular: overall no swelling, no varicosities, no edema bilaterally, normal temperatures of the legs and arms, no clubbing, cyanosis and good capillary refill.  Lymphatic: palpation is normal.  Left shoulder with pain in most any direction, limited use.  Resolving ecchymosis of the left upper arm to elbow area.  NV intact.  ROM of the neck is full.  All other systems reviewed and are negative   The patient has been educated  about the nature of the problem(s) and counseled on treatment options.  The patient appeared to understand what I have discussed and is in agreement with it.  Encounter Diagnoses  Name Primary?  . Other closed nondisplaced fracture of proximal end of left humerus, initial encounter   . Pain in joint of left shoulder Yes  . Cigarette nicotine dependence without complication     PLAN Call if any problems.  Precautions discussed.  Continue current medications.   Return to clinic 2 weeks   X-rays of the left shoulder on return.  Electronically Signed Sanjuana Kava, MD 10/15/201910:33 AM

## 2018-09-08 NOTE — Patient Instructions (Signed)
Steps to Quit Smoking Smoking tobacco can be bad for your health. It can also affect almost every organ in your body. Smoking puts you and people around you at risk for many serious long-lasting (chronic) diseases. Quitting smoking is hard, but it is one of the best things that you can do for your health. It is never too late to quit. What are the benefits of quitting smoking? When you quit smoking, you lower your risk for getting serious diseases and conditions. They can include:  Lung cancer or lung disease.  Heart disease.  Stroke.  Heart attack.  Not being able to have children (infertility).  Weak bones (osteoporosis) and broken bones (fractures).  If you have coughing, wheezing, and shortness of breath, those symptoms may get better when you quit. You may also get sick less often. If you are pregnant, quitting smoking can help to lower your chances of having a baby of low birth weight. What can I do to help me quit smoking? Talk with your doctor about what can help you quit smoking. Some things you can do (strategies) include:  Quitting smoking totally, instead of slowly cutting back how much you smoke over a period of time.  Going to in-person counseling. You are more likely to quit if you go to many counseling sessions.  Using resources and support systems, such as: ? Online chats with a counselor. ? Phone quitlines. ? Printed self-help materials. ? Support groups or group counseling. ? Text messaging programs. ? Mobile phone apps or applications.  Taking medicines. Some of these medicines may have nicotine in them. If you are pregnant or breastfeeding, do not take any medicines to quit smoking unless your doctor says it is okay. Talk with your doctor about counseling or other things that can help you.  Talk with your doctor about using more than one strategy at the same time, such as taking medicines while you are also going to in-person counseling. This can help make  quitting easier. What things can I do to make it easier to quit? Quitting smoking might feel very hard at first, but there is a lot that you can do to make it easier. Take these steps:  Talk to your family and friends. Ask them to support and encourage you.  Call phone quitlines, reach out to support groups, or work with a counselor.  Ask people who smoke to not smoke around you.  Avoid places that make you want (trigger) to smoke, such as: ? Bars. ? Parties. ? Smoke-break areas at work.  Spend time with people who do not smoke.  Lower the stress in your life. Stress can make you want to smoke. Try these things to help your stress: ? Getting regular exercise. ? Deep-breathing exercises. ? Yoga. ? Meditating. ? Doing a body scan. To do this, close your eyes, focus on one area of your body at a time from head to toe, and notice which parts of your body are tense. Try to relax the muscles in those areas.  Download or buy apps on your mobile phone or tablet that can help you stick to your quit plan. There are many free apps, such as QuitGuide from the CDC (Centers for Disease Control and Prevention). You can find more support from smokefree.gov and other websites.  This information is not intended to replace advice given to you by your health care provider. Make sure you discuss any questions you have with your health care provider. Document Released: 09/07/2009 Document   Revised: 07/09/2016 Document Reviewed: 03/28/2015 Elsevier Interactive Patient Education  2018 Elsevier Inc.  

## 2018-09-22 ENCOUNTER — Encounter: Payer: Self-pay | Admitting: Orthopaedic Surgery

## 2018-09-22 ENCOUNTER — Ambulatory Visit (INDEPENDENT_AMBULATORY_CARE_PROVIDER_SITE_OTHER): Payer: Medicare Other

## 2018-09-22 ENCOUNTER — Ambulatory Visit (INDEPENDENT_AMBULATORY_CARE_PROVIDER_SITE_OTHER): Payer: Medicare Other | Admitting: Orthopaedic Surgery

## 2018-09-22 VITALS — BP 127/71 | HR 59 | Ht 71.5 in | Wt 193.0 lb

## 2018-09-22 DIAGNOSIS — S42295D Other nondisplaced fracture of upper end of left humerus, subsequent encounter for fracture with routine healing: Secondary | ICD-10-CM

## 2018-09-22 DIAGNOSIS — S42295A Other nondisplaced fracture of upper end of left humerus, initial encounter for closed fracture: Secondary | ICD-10-CM | POA: Diagnosis not present

## 2018-09-22 NOTE — Progress Notes (Signed)
CC:  My shoulder is sore  His left shoulder is still tender.  He has much less ecchymosis of the left upper arm.  He has some left hand swelling.  NV intact.  X-rays were done of the left shoulder, reported separately.  Encounter Diagnosis  Name Primary?  . Other closed nondisplaced fracture of proximal end of left humerus with routine healing, subsequent encounter Yes   Return in three weeks.  Begin circumduction exercises. I explained how to do.  Call if any problem.  Precautions discussed.  X-rays on return of the left shoulder.  Electronically Signed Sanjuana Kava, MD 10/29/201910:23 AM

## 2018-10-05 DIAGNOSIS — L02415 Cutaneous abscess of right lower limb: Secondary | ICD-10-CM | POA: Diagnosis not present

## 2018-10-05 DIAGNOSIS — L0390 Cellulitis, unspecified: Secondary | ICD-10-CM | POA: Diagnosis not present

## 2018-10-12 DIAGNOSIS — B9562 Methicillin resistant Staphylococcus aureus infection as the cause of diseases classified elsewhere: Secondary | ICD-10-CM | POA: Diagnosis not present

## 2018-10-12 DIAGNOSIS — L02415 Cutaneous abscess of right lower limb: Secondary | ICD-10-CM | POA: Diagnosis not present

## 2018-10-13 ENCOUNTER — Ambulatory Visit: Payer: Medicare Other | Admitting: Orthopaedic Surgery

## 2018-10-13 ENCOUNTER — Encounter: Payer: Self-pay | Admitting: Orthopaedic Surgery

## 2018-10-13 ENCOUNTER — Telehealth: Payer: Self-pay | Admitting: Orthopaedic Surgery

## 2018-10-13 NOTE — Telephone Encounter (Signed)
Called patient to offer re-schedule of today's appointment, 10/13/18, which he missed. Patient's wife Luellen Pucker, designated contact on file, said that he knew about the appointment and "skipped it"; said he states he is fine. Letter also sent.

## 2018-10-19 DIAGNOSIS — M60052 Infective myositis, left thigh: Secondary | ICD-10-CM | POA: Diagnosis not present

## 2018-10-27 DIAGNOSIS — A4902 Methicillin resistant Staphylococcus aureus infection, unspecified site: Secondary | ICD-10-CM | POA: Diagnosis not present

## 2018-11-16 ENCOUNTER — Encounter: Payer: Self-pay | Admitting: Cardiology

## 2018-11-16 ENCOUNTER — Ambulatory Visit (INDEPENDENT_AMBULATORY_CARE_PROVIDER_SITE_OTHER): Payer: Medicare Other | Admitting: Cardiology

## 2018-11-16 VITALS — BP 165/71 | HR 68 | Ht 71.5 in | Wt 196.0 lb

## 2018-11-16 DIAGNOSIS — I2510 Atherosclerotic heart disease of native coronary artery without angina pectoris: Secondary | ICD-10-CM | POA: Diagnosis not present

## 2018-11-16 DIAGNOSIS — I5022 Chronic systolic (congestive) heart failure: Secondary | ICD-10-CM | POA: Diagnosis not present

## 2018-11-16 MED ORDER — EZETIMIBE 10 MG PO TABS: 10.0000 mg | ORAL_TABLET | Freq: Every day | ORAL | 2 refills | Status: DC

## 2018-11-16 MED ORDER — SACUBITRIL-VALSARTAN 97-103 MG PO TABS: 1.0000 | ORAL_TABLET | Freq: Two times a day (BID) | ORAL | 3 refills | Status: DC

## 2018-11-16 NOTE — Patient Instructions (Signed)
Medication Instructions:   Your physician has recommended you make the following change in your medication:   Increase entresto to 97/103 mg by mouth twice daily.  Continue all other medications the same.  Labwork:  Your physician recommends that you return for lab work in: 2 weeks to check your BMET.  Testing/Procedures:  NONE  Follow-Up:  Your physician recommends that you schedule a follow-up appointment in: 4 months. You will receive a reminder letter in the mail in about 2 months reminding you to call and schedule your appointment. If you don't receive this letter, please contact our office.  Any Other Special Instructions Will Be Listed Below (If Applicable).  If you need a refill on your cardiac medications before your next appointment, please call your pharmacy.

## 2018-11-16 NOTE — Progress Notes (Signed)
Clinical Summary [name] is a 84 y.o.male seen today for follow up of the following medical problems.    1. CAD/ICM/Chronic systolic heart failure - history of prior CABG 2003 in [place]  - 09/2013 admission to [facility] with decompensated heart failure. Workup included an echo which showed an LVEF of 15%. He was transferred to [facility] for further management.  - cath 09/2013 showed LM 30%, LAD 100% proximal, LCX 50% ostial, RCA 100% mid with distal vessel filling with right to right and left to right collaterals. LIMA-LAD patent, graft from [place] to OM1, OM2, PDA occluded. No PCI targets, recommendations for medical management. RHC mean PA 43, wedge 22, CI 1.74.  - repeat echo 04/2014 shows LVEF improved to 40-45%. - 01/2018 echo LVEF 35-40%, grade II diastolic dysfunction.   - last visti increased entreseto to 49/51mg  bid. Labs remained stable.  - no recent SOB/DOE since last visit. No recent chest pain      2. Hyperlipidemia  - lipitor and crestor have caused prior muscle aches, not on statin - 06/2017 TC 196 TG 105 HDL 36 LDL 139 - compliant with zetia. Has been resistant to pcsk9 inhibitors.    3. Lung CA - has completed radiation treatment.  - followed by Dr Dwana Curd  4. PAD - followed by vascular, previous stenting to left external iliac and right common iliac.  - s/p right femoral to below-knee popliteal bypass with vein 10/23/2015.   5. Carotid stenosis - repeat US 01/2015 with only mild bilateral disease.  - no recent neuro symptoms.    6. HTN - compliant with meds   7. Hypothyroidism - patient has been reluctant for treatment. - followed by pcp   8. Fall - recent fall and left shoulder injury  Past Medical History:  Diagnosis Date  . Cancer ([place])    L lung  . Coronary artery disease   . Hypercholesteremia   . Hypertension   . PAD (peripheral artery disease) ([place])   . Pneumonia 09/2013  . Radiation april 2016    . Solitary pulmonary nodule on lung CT 12/13/13   11/15/14  . Stroke [facility]) 11/2008     No Known Allergies   Current Outpatient Medications  Medication Sig Dispense Refill  . amLODipine (NORVASC) 5 MG tablet Take 5 mg by mouth daily.    Marland Kitchen aspirin 81 MG EC tablet Take 81 mg by mouth daily.    . carvedilol (COREG) 25 MG tablet TAKE ONE-HALF (1/2) TABLET TWICE A DAY WITH MEALS 90 tablet 3  . ezetimibe (ZETIA) 10 MG tablet TAKE 1 TABLET DAILY 90 tablet 1  . potassium chloride (K-DUR,KLOR-CON) 10 MEQ tablet Take 1 tablet (10 mEq total) by mouth daily. 30 tablet 11  . sacubitril-valsartan (ENTRESTO) 49-51 MG Take 1 tablet by mouth 2 (two) times daily. 180 tablet 3  . traMADol (ULTRAM) 50 MG tablet TK 1 T PO Q 8 H PRN P  0   No current facility-administered medications for this visit.      Past Surgical History:  Procedure Laterality Date  . Angioplasty to Left femoral artery    . CARPAL TUNNEL RELEASE Left 05/11/2014   Procedure: LEFT CARPAL TUNNEL RELEASE;  Surgeon: Carole Civil, MD;  Location: [facility] ORS;  Service: Orthopedics;  Laterality: Left;  . COLONOSCOPY N/A 01/24/2015   Procedure: COLONOSCOPY;  Surgeon: Danie Binder, MD;  Location: [facility] ENDO SUITE;  Service: Endoscopy;  Laterality: N/A;  130  . CORONARY  ARTERY BYPASS GRAFT     x4  . FEMORAL-POPLITEAL BYPASS GRAFT Right 10/23/2015   Procedure: BYPASS GRAFT FEMORAL TO BELOW KNEE POPLITEAL ARTERY USING RIGHT NON-REVERSED GREATER SAPPHENOUS VEIN;  Surgeon: [name], MD;  Location: [place];  Service: Vascular;  Laterality: Right;  . INTRAOPERATIVE ARTERIOGRAM Right 10/23/2015   Procedure: INTRA OPERATIVE ARTERIOGRAM - RIGHT LOWER LEG;  Surgeon: [name], MD;  Location: [place];  Service: Vascular;  Laterality: Right;  . KNEE ARTHROSCOPY Right   . LEFT AND RIGHT HEART CATHETERIZATION WITH CORONARY ANGIOGRAM N/A 09/29/2013   Procedure: LEFT AND RIGHT HEART CATHETERIZATION WITH CORONARY ANGIOGRAM;  Surgeon: Burnell Blanks, MD;  Location: [facility] CATH LAB;  Service: Cardiovascular;  Laterality: N/A;  . PERIPHERAL VASCULAR CATHETERIZATION N/A 04/21/2015   Procedure: Abdominal Aortogram w/Lower Extremity;  Surgeon: [name], MD;  Location: [place] CV LAB;  Service: Cardiovascular;  Laterality: N/A;  . Stent to right femoral artery    . TRIGGER FINGER RELEASE Bilateral   . VEIN HARVEST Right 10/23/2015   Procedure: [place];  Surgeon: [name], MD;  Location: [facility] OR;  Service: Vascular;  Laterality: Right;     No Known Allergies    Family History  Problem Relation Age of Onset  . Heart attack Father   . Hyperlipidemia Father   . Diabetes Mother   . Diabetes Brother   . Colon cancer Neg Hx      Social History [name] reports that he has been smoking cigars. He started smoking about 69 years ago. He has a 60.00 pack-year smoking history. He has never used smokeless tobacco. [name] reports no history of alcohol use.   Review of Systems CONSTITUTIONAL: No weight loss, fever, chills, weakness or fatigue.  HEENT: Eyes: No visual loss, blurred vision, double vision or yellow sclerae.No hearing loss, sneezing, congestion, runny nose or sore throat.  SKIN: No rash or itching.  CARDIOVASCULAR: per hpi RESPIRATORY: No shortness of breath, cough or sputum.  GASTROINTESTINAL: No anorexia, nausea, vomiting or diarrhea. No abdominal pain or blood.  GENITOURINARY: No burning on urination, no polyuria NEUROLOGICAL: No headache, dizziness, syncope, paralysis, ataxia, numbness or tingling in the extremities. No change in bowel or bladder control.  MUSCULOSKELETAL: No muscle, back pain, joint pain or stiffness.  LYMPHATICS: No enlarged nodes. No history of splenectomy.  PSYCHIATRIC: No history of depression or anxiety.  ENDOCRINOLOGIC: No reports of sweating, cold or heat intolerance. No polyuria or polydipsia.  Marland Kitchen   Physical Examination Vitals:    11/16/18 0930  BP: (!) 165/71  Pulse: 68  SpO2: 99%   Vitals:   11/16/18 0930  Weight: 196 lb (88.9 kg)  Height: 5' 11.5" (1.816 m)    Gen: resting comfortably, no acute distress HEENT: no scleral icterus, pupils equal round and reactive, no palptable cervical adenopathy,  CV: RRR, no mrg, no jvd Resp: Clear to auscultation bilaterally GI: abdomen is soft, non-tender, non-distended, normal bowel sounds, no hepatosplenomegaly MSK: extremities are warm, no edema.  Skin: warm, no rash Neuro:  no focal deficits Psych: appropriate affect   Diagnostic Studies 09/29/13 Cath Hemodynamic Findings: Ao: 162/70  LV: 163/19/29  RA: 12 RV: 66/7/16  PA: 67/26 (mean 43)  PCWP: 22  Fick Cardiac Output: 3.5 L/min  Fick Cardiac Index: 1.74 L/min/m2  Central Aortic Saturation: 91%  Pulmonary Artery Saturation: 50%  Angiographic Findings:  Left main: Distal 30% stenosis.  Left Anterior Descending Artery: 100% proximal  occlusion. Proximal, mid and distal vessel fills from the patent IMA graft.  Circumflex Artery: Large caliber vessel with large intermediate Zarah Carbon. Diffuse non-obstructive plaque in the large bifurcating intermediate Kristin Lamagna. The ostium of the Circumflex has 50% stenosis.  Right Coronary Artery: Large dominant vessel with 100% mid occlusion. The distal vessel fills right to right bridging collaterals and left to right collaterals.  Graft Anatomy:  LIMA to LAD is patent  Free Radial (? Y graft from LIMA) to OM1, OM2, PDA is occluded.  Left Ventricular Angiogram: Deferred.  Impression:  1. Triple vessel CAD with patent LIMA to LAD, occluded RCA with good collateral filling, moderate disease in Circumflex  2. No focal targets for PCI  3. Ischemic cardiomyopathy with acute systolic CHF   56/8/12 Echo LVEF 15-20%, moderate LVH, severe diffuse hypokinesis, restrictive diastolic dysfunction, multiple WMAs, moderate MR, severe RV dysfunction RV TAPSE 0.7  cm, PASP 61 mmHg.   12/2013 Lab TC 175 TG 111 LDL 116 HDL 37  TSH 7.7 HgbA1c 6.6 Na 143 K 4.5 Cr 1.22 GFR56   04/2014 Echo Study Conclusions  - Left ventricle: The cavity size was normal. Wall thickness was increased in a pattern of mild LVH. Systolic function was mildly to moderately reduced. The estimated ejection fraction was in the range of 40% to 45%. There is diastolic dysfunction, indeterminant grade. - Regional wall motion abnormality: Hypokinesis of the mid anterior and basal-mid anteroseptal myocardium. - Aortic valve: Moderately calcified annulus. Trileaflet; moderately thickened leaflets. Valve area (VTI): 2.48 cm^2. Valve area (Vmax): 2.23 cm^2. - Mitral valve: Moderately calcified annulus. Mildly thickened leaflets . - Left atrium: The atrium was moderately dilated. - Right atrium: The atrium was mildly dilated. - Technically adequate study.   01/2015 Carotid US bilaterall 1-39%    01/2018 echo Study Conclusions  - Left ventricle: The cavity size was normal. Wall thickness was increased in a pattern of moderate LVH. Systolic function was moderately reduced. The estimated ejection fraction was in the range of 35% to 40%. Diffuse hypokinesis. Features are consistent with a pseudonormal left ventricular filling pattern, with concomitant abnormal relaxation and increased filling pressure (grade 2 diastolic dysfunction). Doppler parameters are consistent with high ventricular filling pressure. - Regional wall motion abnormality: Moderate hypokinesis of the basal-mid anteroseptal, mid inferoseptal, and mid inferior myocardium. - Aortic valve: Moderately calcified annulus. Mildly thickened, mildly calcified leaflets. There appears to be at least mild (if not mild to moderate) calcific aortic valvular stenosis. I suspect peak velocity and mean gradient is underestimated due to depressed LV function. Peak velocity (S): 159 cm/s.  Mean gradient (S): 6 mm Hg. Valve area (VTI): 1.66 cm^2. Valve area (Vmax): 1.73 cm^2. Valve area (Vmean): 1.52 cm^2. - Mitral valve: Calcified annulus. Restricted leaflet motion due to depressed LV function. There was mild regurgitation. Valve area by continuity equation (using LVOT flow): 1.21 cm^2. - Left atrium: The atrium was mildly dilated. - Right ventricle: Systolic function was moderately reduced. - Tricuspid valve: There was mild regurgitation     Assessment and Plan  1. CAD/ICM/CAD -no significant symptoms. We will increase entresto to 97/103mg  bid, repeat BMET in 2 weeks.   2. Hyperlipidemia  - intolerant to statins,continue zetia - he remains resistant to pcsk9 inhibitors.   3.HTN - above goal, increasing entresto as reported above   F/u 4 months      Arnoldo Lenis, M.D.

## 2018-12-03 ENCOUNTER — Other Ambulatory Visit (HOSPITAL_COMMUNITY)
Admission: RE | Admit: 2018-12-03 | Discharge: 2018-12-03 | Disposition: A | Discharge status: Home / Self Care | Payer: Medicare Other | Source: Ambulatory Visit | Attending: Cardiology | Admitting: Cardiology

## 2018-12-03 DIAGNOSIS — I5022 Chronic systolic (congestive) heart failure: Secondary | ICD-10-CM | POA: Insufficient documentation

## 2018-12-03 LAB — BASIC METABOLIC PANEL
Anion gap: 7 (ref 5–15)
BUN: 16 mg/dL (ref 8–23)
CALCIUM: 9.3 mg/dL (ref 8.9–10.3)
CO2: 27 mmol/L (ref 22–32)
Chloride: 106 mmol/L (ref 98–111)
Creatinine, Ser: 1.18 mg/dL (ref 0.61–1.24)
GFR calc Af Amer: >60 mL/min (ref >60)
GFR, EST NON AFRICAN AMERICAN: 56 mL/min — AB (ref >60)
GLUCOSE: 130 mg/dL — AB (ref 70–99)
POTASSIUM: 4.4 mmol/L (ref 3.5–5.1)
Sodium: 140 mmol/L (ref 135–145)

## 2018-12-09 ENCOUNTER — Telehealth: Payer: Self-pay | Admitting: *Deleted

## 2018-12-09 NOTE — Telephone Encounter (Signed)
-----   Message from Arnoldo Lenis, MD sent at 12/09/2018  3:50 PM EST ----- Labs look fine  Zandra Abts MD

## 2018-12-09 NOTE — Telephone Encounter (Signed)
Pt aware - routed to pcp  

## 2018-12-10 ENCOUNTER — Emergency Department (HOSPITAL_COMMUNITY): Payer: Medicare Other

## 2018-12-10 ENCOUNTER — Encounter (HOSPITAL_COMMUNITY): Payer: Self-pay

## 2018-12-10 ENCOUNTER — Other Ambulatory Visit: Payer: Self-pay

## 2018-12-10 ENCOUNTER — Emergency Department (HOSPITAL_COMMUNITY)
Admission: EM | Admit: 2018-12-10 | Discharge: 2018-12-10 | Disposition: A | Discharge status: Home / Self Care | Payer: Medicare Other | Attending: Emergency Medicine | Admitting: Emergency Medicine

## 2018-12-10 DIAGNOSIS — Z5321 Procedure and treatment not carried out due to patient leaving prior to being seen by health care provider: Secondary | ICD-10-CM | POA: Diagnosis not present

## 2018-12-10 DIAGNOSIS — M7989 Other specified soft tissue disorders: Secondary | ICD-10-CM | POA: Diagnosis not present

## 2018-12-10 DIAGNOSIS — M25522 Pain in left elbow: Secondary | ICD-10-CM | POA: Diagnosis not present

## 2018-12-10 DIAGNOSIS — M71022 Abscess of bursa, left elbow: Secondary | ICD-10-CM | POA: Diagnosis not present

## 2018-12-10 LAB — COMPREHENSIVE METABOLIC PANEL
ALT: 12 U/L (ref 0–44)
AST: 16 U/L (ref 15–41)
Albumin: 3.4 g/dL — ABNORMAL LOW (ref 3.5–5.0)
Alkaline Phosphatase: 60 U/L (ref 38–126)
Anion gap: 11 (ref 5–15)
BUN: 14 mg/dL (ref 8–23)
CALCIUM: 9.6 mg/dL (ref 8.9–10.3)
CO2: 25 mmol/L (ref 22–32)
Chloride: 105 mmol/L (ref 98–111)
Creatinine, Ser: 1.22 mg/dL (ref 0.61–1.24)
GFR calc Af Amer: >60 mL/min (ref >60)
GFR calc non Af Amer: 54 mL/min — ABNORMAL LOW (ref >60)
Glucose, Bld: 116 mg/dL — ABNORMAL HIGH (ref 70–99)
Potassium: 4.8 mmol/L (ref 3.5–5.1)
Sodium: 141 mmol/L (ref 135–145)
Total Bilirubin: 1.4 mg/dL — ABNORMAL HIGH (ref 0.3–1.2)
Total Protein: 6.8 g/dL (ref 6.5–8.1)

## 2018-12-10 LAB — CBC WITH DIFFERENTIAL/PLATELET
Abs Immature Granulocytes: 0.03 10*3/uL (ref 0.00–0.07)
Basophils Absolute: 0 10*3/uL (ref 0.0–0.1)
Basophils Relative: 0 %
Eosinophils Absolute: 0.5 10*3/uL (ref 0.0–0.5)
Eosinophils Relative: 6 %
HCT: 40.2 % (ref 39.0–52.0)
Hemoglobin: 12.4 g/dL — ABNORMAL LOW (ref 13.0–17.0)
Immature Granulocytes: 0 %
Lymphocytes Relative: 16 %
Lymphs Abs: 1.2 10*3/uL (ref 0.7–4.0)
MCH: 29.2 pg (ref 26.0–34.0)
MCHC: 30.8 g/dL (ref 30.0–36.0)
MCV: 94.6 fL (ref 80.0–100.0)
MONOS PCT: 5 %
Monocytes Absolute: 0.4 10*3/uL (ref 0.1–1.0)
Neutro Abs: 5.6 10*3/uL (ref 1.7–7.7)
Neutrophils Relative %: 73 %
Platelets: 211 10*3/uL (ref 150–400)
RBC: 4.25 MIL/uL (ref 4.22–5.81)
RDW: 13.8 % (ref 11.5–15.5)
WBC: 7.8 10*3/uL (ref 4.0–10.5)
nRBC: 0 % (ref 0.0–0.2)

## 2018-12-10 MED ORDER — SODIUM CHLORIDE 0.9% FLUSH: 3.0000 mL | Freq: Once | INTRAVENOUS | Status: DC

## 2018-12-10 NOTE — ED Notes (Signed)
Pt noted to not be in the waiting room any longer. Will call name x1 more to confirm.

## 2018-12-10 NOTE — ED Triage Notes (Signed)
Pt here from UC for left elbow pain and possible septic joint.  Left elbow is red and has area that was draining white pus.  No drainage at this time.  Was treated previous for MRSA of the right hip a month ago.  A&Ox4.

## 2018-12-10 NOTE — ED Notes (Signed)
Pt called over loud speaker x2 no reply.

## 2018-12-27 ENCOUNTER — Encounter (HOSPITAL_COMMUNITY): Payer: Self-pay | Admitting: Emergency Medicine

## 2018-12-27 ENCOUNTER — Emergency Department (HOSPITAL_COMMUNITY): Payer: Medicare Other

## 2018-12-27 ENCOUNTER — Emergency Department (HOSPITAL_COMMUNITY)
Admission: EM | Admit: 2018-12-27 | Discharge: 2018-12-27 | Disposition: A | Discharge status: Home / Self Care | Payer: Medicare Other | Attending: Emergency Medicine | Admitting: Emergency Medicine

## 2018-12-27 ENCOUNTER — Other Ambulatory Visit: Payer: Self-pay

## 2018-12-27 DIAGNOSIS — R05 Cough: Secondary | ICD-10-CM | POA: Diagnosis not present

## 2018-12-27 DIAGNOSIS — I110 Hypertensive heart disease with heart failure: Secondary | ICD-10-CM | POA: Diagnosis not present

## 2018-12-27 DIAGNOSIS — Z79899 Other long term (current) drug therapy: Secondary | ICD-10-CM | POA: Diagnosis not present

## 2018-12-27 DIAGNOSIS — I4891 Unspecified atrial fibrillation: Secondary | ICD-10-CM | POA: Diagnosis not present

## 2018-12-27 DIAGNOSIS — I509 Heart failure, unspecified: Secondary | ICD-10-CM | POA: Diagnosis not present

## 2018-12-27 DIAGNOSIS — R0602 Shortness of breath: Secondary | ICD-10-CM | POA: Diagnosis not present

## 2018-12-27 DIAGNOSIS — J189 Pneumonia, unspecified organism: Secondary | ICD-10-CM | POA: Diagnosis not present

## 2018-12-27 DIAGNOSIS — Z7982 Long term (current) use of aspirin: Secondary | ICD-10-CM | POA: Diagnosis not present

## 2018-12-27 DIAGNOSIS — F17210 Nicotine dependence, cigarettes, uncomplicated: Secondary | ICD-10-CM | POA: Insufficient documentation

## 2018-12-27 DIAGNOSIS — I2510 Atherosclerotic heart disease of native coronary artery without angina pectoris: Secondary | ICD-10-CM | POA: Diagnosis not present

## 2018-12-27 LAB — BASIC METABOLIC PANEL
Anion gap: 7 (ref 5–15)
BUN: 21 mg/dL (ref 8–23)
CO2: 29 mmol/L (ref 22–32)
Calcium: 9.5 mg/dL (ref 8.9–10.3)
Chloride: 108 mmol/L (ref 98–111)
Creatinine, Ser: 1.45 mg/dL — ABNORMAL HIGH (ref 0.61–1.24)
GFR calc Af Amer: 51 mL/min — ABNORMAL LOW (ref >60)
GFR calc non Af Amer: 44 mL/min — ABNORMAL LOW (ref >60)
Glucose, Bld: 154 mg/dL — ABNORMAL HIGH (ref 70–99)
Potassium: 3.9 mmol/L (ref 3.5–5.1)
SODIUM: 144 mmol/L (ref 135–145)

## 2018-12-27 LAB — CBC
HCT: 39.6 % (ref 39.0–52.0)
Hemoglobin: 11.8 g/dL — ABNORMAL LOW (ref 13.0–17.0)
MCH: 29.5 pg (ref 26.0–34.0)
MCHC: 29.8 g/dL — ABNORMAL LOW (ref 30.0–36.0)
MCV: 99 fL (ref 80.0–100.0)
PLATELETS: 162 10*3/uL (ref 150–400)
RBC: 4 MIL/uL — ABNORMAL LOW (ref 4.22–5.81)
RDW: 15.2 % (ref 11.5–15.5)
WBC: 6 10*3/uL (ref 4.0–10.5)
nRBC: 0 % (ref 0.0–0.2)

## 2018-12-27 MED ORDER — AMOXICILLIN-POT CLAVULANATE 875-125 MG PO TABS: 1.0000 | ORAL_TABLET | Freq: Two times a day (BID) | ORAL | 0 refills | Status: DC

## 2018-12-27 MED ORDER — AMOXICILLIN-POT CLAVULANATE 875-125 MG PO TABS
1.0000 | ORAL_TABLET | Freq: Once | ORAL | Status: AC
  Administered 2018-12-27: 1 via ORAL
  Filled 2018-12-27: qty 1

## 2018-12-27 MED ORDER — SODIUM CHLORIDE 0.9% FLUSH: 3.0000 mL | Freq: Once | INTRAVENOUS | Status: DC

## 2018-12-27 NOTE — ED Notes (Signed)
Bed: VO59 Expected date:  Expected time:  Means of arrival:  Comments: Hold triage 1

## 2018-12-27 NOTE — Discharge Instructions (Signed)
The testing today indicates that you have a pneumonia causing your congestion and coughing.  We are prescribing an antibiotic, which we sent to your pharmacy.  Take it as prescribed to improve your discomfort.  For cough you can take Robitussin-DM.  Use Tylenol if needed for fever or pain.  Call your cardiologist for follow-up appointment regarding the abnormal EKG which showed atrial fibrillation.  Your cardiologist may need to do some further testing and treatment including considering you for anticoagulation treatment.

## 2018-12-27 NOTE — ED Triage Notes (Signed)
Pt c/o worsening shortness of breath and cough x 1 week.

## 2018-12-27 NOTE — ED Provider Notes (Addendum)
[place] DEPT Provider Note   CSN: [id number] Arrival date & time: 12/27/18  1228     History   Chief Complaint Chief Complaint  Patient presents with  . Shortness of Breath  . Cough    HPI Gary Odonnell is a 85 y.o. male.  HPI  He complains of shortness of breath with cough for 1 week.  He denies fever, chills, nausea or vomiting.  He is occasionally producing sputum.  No other recent illnesses.  He is here with his wife.  He is a cigarette smoker.  There are no other known modifying factors.   Past Medical History:  Diagnosis Date  . Cancer ([place])    L lung  . Coronary artery disease   . Hypercholesteremia   . Hypertension   . PAD (peripheral artery disease) ([place])   . Pneumonia 09/2013  . Radiation april 2016  . Solitary pulmonary nodule on lung CT 12/13/13   11/15/14  . Stroke [facility]) 11/2008    Patient Active Problem List   Diagnosis Date Noted  . Left shoulder pain 09/01/2018  . Iliac artery stenosis, left ([place]) 04/22/2015  . PAD (peripheral artery disease) ([place]) 04/21/2015  . Primary cancer of left lower lobe of lung ([place]) 02/02/2015  . Abnormal PET scan of colon 01/04/2015  . Solitary pulmonary nodule on lung CT   . Carpal tunnel syndrome, left 05/03/2014  . Acute renal failure ([place]) 09/27/2013  . HTN (hypertension) 09/26/2013  . CHF (congestive heart failure) ([place]) 09/26/2013  . CVA (cerebral infarction) 09/26/2013  . CAP (community acquired pneumonia) 09/26/2013  . CAD (coronary atherosclerotic disease) 09/26/2013  . S/P CABG x 4 09/26/2013    Past Surgical History:  Procedure Laterality Date  . Angioplasty to Left femoral artery    . CARPAL TUNNEL RELEASE Left 05/11/2014   Procedure: LEFT CARPAL TUNNEL RELEASE;  Surgeon: Carole Civil, MD;  Location: [facility] ORS;  Service: Orthopedics;  Laterality: Left;  . COLONOSCOPY N/A 01/24/2015   Procedure: COLONOSCOPY;  Surgeon: Danie Binder, MD;  Location[facility] ENDO SUITE;   Service: Endoscopy;  Laterality: N/A;  130  . CORONARY ARTERY BYPASS GRAFT     x4  . FEMORAL-POPLITEAL BYPASS GRAFT Right 10/23/2015   Procedure: BYPASS GRAFT FEMORAL TO BELOW KNEE POPLITEAL ARTERY USING RIGHT NON-REVERSED GREATER SAPPHENOUS VEIN;  Surgeon: [name], MD;  Location: [place];  Service: Vascular;  Laterality: Right;  . INTRAOPERATIVE ARTERIOGRAM Right 10/23/2015   Procedure: INTRA OPERATIVE ARTERIOGRAM - RIGHT LOWER LEG;  Surgeon: [name], MD;  Location: [place];  Service: Vascular;  Laterality: Right;  . KNEE ARTHROSCOPY Right   . LEFT AND RIGHT HEART CATHETERIZATION WITH CORONARY ANGIOGRAM N/A 09/29/2013   Procedure: LEFT AND RIGHT HEART CATHETERIZATION WITH CORONARY ANGIOGRAM;  Surgeon: Burnell Blanks, MD;  Location: [facility] CATH LAB;  Service: Cardiovascular;  Laterality: N/A;  . PERIPHERAL VASCULAR CATHETERIZATION N/A 04/21/2015   Procedure: Abdominal Aortogram w/Lower Extremity;  Surgeon: [name], MD;  Location: [place] CV LAB;  Service: Cardiovascular;  Laterality: N/A;  . Stent to right femoral artery    . TRIGGER FINGER RELEASE Bilateral   . VEIN HARVEST Right 10/23/2015   Procedure: [place];  Surgeon: [name], MD;  Location: [place];  Service: Vascular;  Laterality: Right;        Home Medications    Prior to Admission medications   Medication Sig Start Date End Date Taking?  Authorizing Provider  amLODipine (NORVASC) 5 MG tablet Take 5 mg by mouth daily.    [provider]  amoxicillin-clavulanate (AUGMENTIN) 875-125 MG tablet Take 1 tablet by mouth 2 (two) times daily. One po bid x 7 days 12/27/18   Daleen Bo, MD  aspirin 81 MG EC tablet Take 81 mg by mouth daily.    [provider]  carvedilol (COREG) 25 MG tablet TAKE ONE-HALF (1/2) TABLET TWICE A DAY WITH MEALS 01/26/18   Arnoldo Lenis, MD  ezetimibe (ZETIA) 10 MG tablet Take 1 tablet (10 mg total) by mouth daily. 11/16/18    Arnoldo Lenis, MD  potassium chloride (K-DUR,KLOR-CON) 10 MEQ tablet Take 1 tablet (10 mEq total) by mouth daily. 10/01/13   Barrett, Evelene Croon, PA-C  sacubitril-valsartan (ENTRESTO) 97-103 MG Take 1 tablet by mouth 2 (two) times daily. 11/16/18   Arnoldo Lenis, MD    Family History Family History  Problem Relation Age of Onset  . Heart attack Father   . Hyperlipidemia Father   . Diabetes Mother   . Diabetes Brother   . Colon cancer Neg Hx     Social History Social History   Tobacco Use  . Smoking status: Current Every Day Smoker    Packs/day: 1.00    Years: 60.00    Pack years: 60.00    Types: Cigars    Start date: 11/21/1949  . Smokeless tobacco: Never Used  . Tobacco comment: former cigarette smoker/smoked cigarettes for 30 years  Substance Use Topics  . Alcohol use: No    Alcohol/week: 0.0 standard drinks  . Drug use: No     Allergies   Patient has no known allergies.   Review of Systems Review of Systems  All other systems reviewed and are negative.    Physical Exam Updated Vital Signs BP (!) 154/89   Pulse 63   Temp (!) 97.5 F (36.4 C) (Axillary)   Resp 18   SpO2 94%   Physical Exam Vitals signs and nursing note reviewed.  Constitutional:      General: He is not in acute distress.    Appearance: He is well-developed. He is not ill-appearing or diaphoretic.     Comments: elderly  HENT:     Head: Normocephalic and atraumatic.     Right Ear: External ear normal.     Left Ear: External ear normal.  Eyes:     Conjunctiva/sclera: Conjunctivae normal.     Pupils: Pupils are equal, round, and reactive to light.  Neck:     Musculoskeletal: Normal range of motion and neck supple.     Trachea: Phonation normal.  Cardiovascular:     Rate and Rhythm: Normal rate. Rhythm irregular.     Heart sounds: Normal heart sounds.  Pulmonary:     Effort: Pulmonary effort is normal.     Breath sounds: Rhonchi (scattered) present.  Abdominal:      Palpations: Abdomen is soft.     Tenderness: There is no abdominal tenderness.  Musculoskeletal: Normal range of motion.  Skin:    General: Skin is warm and dry.  Neurological:     Mental Status: He is alert and oriented to person, place, and time.     Cranial Nerves: No cranial nerve deficit.     Sensory: No sensory deficit.     Motor: No abnormal muscle tone.     Coordination: Coordination normal.  Psychiatric:        Mood and Affect: Mood normal.  Behavior: Behavior normal.      ED Treatments / Results  Labs (all labs ordered are listed, but only abnormal results are displayed) Labs Reviewed  BASIC METABOLIC PANEL - Abnormal; Notable for the following components:      Result Value   Glucose, Bld 154 (*)    Creatinine, Ser 1.45 (*)    GFR calc non Af Amer 44 (*)    GFR calc Af Amer 51 (*)    All other components within normal limits  CBC - Abnormal; Notable for the following components:   RBC 4.00 (*)    Hemoglobin 11.8 (*)    MCHC 29.8 (*)    All other components within normal limits    EKG EKG Interpretation  Date/Time:  Sunday December 27 2018 12:37:09 EST Ventricular Rate:  59 PR Interval:    QRS Duration: 145 QT Interval:  456 QTC Calculation: 452 R Axis:   -65 Text Interpretation:  Atrial fibrillation Left bundle branch block Since last tracing Atrial fibrillation is new Confirmed by Daleen Bo [phone]) on 12/27/2018 1:47:20 PM  This patients CHA2DS2-VASc Score and unadjusted Ischemic Stroke Rate (% per year) is equal to 9.7 % stroke rate/year from a score of 6  Above score calculated as 1 point each if present [CHF, HTN, DM, Vascular=MI/PAD/Aortic Plaque, Age if 65-74, or Male] Above score calculated as 2 points each if present [Age > 75, or Stroke/TIA/TE]     Radiology Dg Chest 2 View  Result Date: 12/27/2018 CLINICAL DATA:  Shortness of breath and cough for 1 week. History of lung cancer. EXAM: CHEST - 2 VIEW COMPARISON:  Single-view of the  chest 12/27/2014. CT chest 07/04/2015. FINDINGS: There is left worse than right basilar airspace disease. Small left effusion is identified. Cardiomegaly and vascular congestion are seen. The patient is status post CABG. Aortic atherosclerosis is noted. Remote T12 compression fracture is unchanged. IMPRESSION: Small left pleural effusion and left worse than right basilar airspace disease which could be atelectasis or pneumonia. Cardiomegaly and pulmonary vascular congestion. Atherosclerosis. Electronically Signed   By: Inge Rise M.D.   On: 12/27/2018 13:29    Procedures Procedures (including critical care time)  Medications Ordered in ED Medications  sodium chloride flush (NS) 0.9 % injection 3 mL (has no administration in time range)  amoxicillin-clavulanate (AUGMENTIN) 875-125 MG per tablet 1 tablet (has no administration in time range)     Initial Impression / Assessment and Plan / ED Course  I have reviewed the triage vital signs and the nursing notes.  Pertinent labs & imaging results that were available during my care of the patient were reviewed by me and considered in my medical decision making (see chart for details).      Patient Vitals for the past 24 hrs:  BP Temp Temp src Pulse Resp SpO2  12/27/18 1343 (!) 154/89 - - 63 18 94 %  12/27/18 1313 (!) 154/89 - - 72 (!) 21 96 %  12/27/18 1239 - (!) 97.5 F (36.4 C) Axillary - - -  12/27/18 1237 (!) 158/88 - - 61 16 95 %    2:29 PM Reevaluation with update and discussion. After initial assessment and treatment, an updated evaluation reveals no change in clinical status.  Findings discussed with the patient and his daughter, and all questions were answered. Daleen Bo   Medical Decision Making: Cough with production, and abnormal chest x-ray consistent with pneumonia.  New onset atrial fibrillation, rate controlled.  Timing of onset of atrial  fibrillation is unclear.  Patient not a candidate for cardioversion in the  emergency department.  Doubt serious bacterial infection, metabolic instability impending vascular collapse.  CRITICAL CARE-no Performed by: Daleen Bo  Nursing Notes Reviewed/ Care Coordinated Applicable Imaging Reviewed Interpretation of Laboratory Data incorporated into ED treatment  The patient appears reasonably screened and/or stabilized for discharge and I doubt any other medical condition or other [facility] requiring further screening, evaluation, or treatment in the ED at this time prior to discharge.  Plan: Home Medications-continue usual medications, use OTC cough suppressant and antipyretic/pain reliever if needed; Home Treatments-rest, fluids, 3 regular meals a day; return here if the recommended treatment, does not improve the symptoms; Recommended follow up-ECP follow-up 3 days for checkup.  Cardiology follow-up as soon as possible for evaluation of atrial fibrillation, new onset   Final Clinical Impressions(s) / ED Diagnoses   Final diagnoses:  Community acquired pneumonia, unspecified laterality  Atrial fibrillation, unspecified type [facility])    ED Discharge Orders         Ordered    amoxicillin-clavulanate (AUGMENTIN) 875-125 MG tablet  2 times daily     12/27/18 1426           Daleen Bo, MD 12/27/18 1431    Daleen Bo, MD 01/05/19 1456    Daleen Bo, MD 01/11/19 1958

## 2018-12-29 ENCOUNTER — Encounter: Payer: Self-pay | Admitting: Cardiology

## 2018-12-29 ENCOUNTER — Ambulatory Visit (INDEPENDENT_AMBULATORY_CARE_PROVIDER_SITE_OTHER): Payer: Medicare Other | Admitting: Cardiology

## 2018-12-29 VITALS — BP 144/69 | HR 76 | Ht 71.5 in | Wt 197.0 lb

## 2018-12-29 DIAGNOSIS — I4891 Unspecified atrial fibrillation: Secondary | ICD-10-CM | POA: Diagnosis not present

## 2018-12-29 DIAGNOSIS — J189 Pneumonia, unspecified organism: Secondary | ICD-10-CM | POA: Diagnosis not present

## 2018-12-29 MED ORDER — APIXABAN 5 MG PO TABS: 5.0000 mg | ORAL_TABLET | Freq: Two times a day (BID) | ORAL | 0 refills | Status: DC

## 2018-12-29 MED ORDER — APIXABAN 5 MG PO TABS: 5.0000 mg | ORAL_TABLET | Freq: Two times a day (BID) | ORAL | 1 refills | Status: DC

## 2018-12-29 MED ORDER — PREDNISONE 20 MG PO TABS: ORAL_TABLET | ORAL | 0 refills | Status: DC

## 2018-12-29 NOTE — Patient Instructions (Signed)
Your physician recommends that you schedule a follow-up appointment in: [place] has recommended you make the following change in your medication:   STOP ASPIRIN   START ELIQUIS 5 MG TWICE DAILY - WE HAVE GIVEN YOU SAMPLES AND 30 DAY FREE CARD  TAKE PREDNISONE 40 MG DAILY (2 TABLETS) FOR 5 DAYS  Thank you for choosing [facility]!!

## 2018-12-29 NOTE — Progress Notes (Signed)
Clinical Summary Gary Odonnell is a 85 y.o.male seen today for follow up of the following medical problems. This is a focused visit on recent diagnosis of afib during ER visit with pneumonia.    1. Afib - new diagnosis during 12/27/2018 ER visit - CHADS2Vasc score is (age x2, CHF, HTN, stroke x2, CAD) is 7 - denies any palpitations  2. Pneumonia - recent diagnosis during 12/2018 ER visit. +cough, productive cough - CXR left>R basilar airspace disease. WBC 6.  - discharged on augmentin    Past Medical History:  Diagnosis Date  . Cancer ([place])    L lung  . Coronary artery disease   . Hypercholesteremia   . Hypertension   . PAD (peripheral artery disease) ([place])   . Pneumonia 09/2013  . Radiation april 2016  . Solitary pulmonary nodule on lung CT 12/13/13   11/15/14  . Stroke [facility]) 11/2008     No Known Allergies   Current Outpatient Medications  Medication Sig Dispense Refill  . amLODipine (NORVASC) 5 MG tablet Take 5 mg by mouth daily.    Marland Kitchen amoxicillin-clavulanate (AUGMENTIN) 875-125 MG tablet Take 1 tablet by mouth 2 (two) times daily. One po bid x 7 days 14 tablet 0  . aspirin 81 MG EC tablet Take 81 mg by mouth daily.    . carvedilol (COREG) 25 MG tablet TAKE ONE-HALF (1/2) TABLET TWICE A DAY WITH MEALS 90 tablet 3  . ezetimibe (ZETIA) 10 MG tablet Take 1 tablet (10 mg total) by mouth daily. 90 tablet 2  . potassium chloride (K-DUR,KLOR-CON) 10 MEQ tablet Take 1 tablet (10 mEq total) by mouth daily. 30 tablet 11  . sacubitril-valsartan (ENTRESTO) 97-103 MG Take 1 tablet by mouth 2 (two) times daily. 180 tablet 3   No current facility-administered medications for this visit.      Past Surgical History:  Procedure Laterality Date  . Angioplasty to Left femoral artery    . CARPAL TUNNEL RELEASE Left 05/11/2014   Procedure: LEFT CARPAL TUNNEL RELEASE;  Surgeon: Gary Civil, MD;  Location: [facility];  Service: Orthopedics;  Laterality: Left;  . COLONOSCOPY N/A  01/24/2015   Procedure: COLONOSCOPY;  Surgeon: Gary Binder, MD;  Location[facility];  Service: Endoscopy;  Laterality: N/A;  130  . CORONARY ARTERY BYPASS GRAFT     x4  . FEMORAL-POPLITEAL BYPASS GRAFT Right 10/23/2015   Procedure: BYPASS GRAFT FEMORAL TO BELOW KNEE POPLITEAL ARTERY USING RIGHT NON-REVERSED GREATER SAPPHENOUS VEIN;  Surgeon: Gary Dutch, MD;  Location: [place];  Service: Vascular;  Laterality: Right;  . INTRAOPERATIVE ARTERIOGRAM Right 10/23/2015   Procedure: INTRA OPERATIVE ARTERIOGRAM - RIGHT LOWER LEG;  Surgeon: Gary Dutch, MD;  Location: [place];  Service: Vascular;  Laterality: Right;  . KNEE ARTHROSCOPY Right   . LEFT AND RIGHT HEART CATHETERIZATION WITH CORONARY ANGIOGRAM N/A 09/29/2013   Procedure: LEFT AND RIGHT HEART CATHETERIZATION WITH CORONARY ANGIOGRAM;  Surgeon: Gary Blanks, MD;  Location: [facility] CATH LAB;  Service: Cardiovascular;  Laterality: N/A;  . PERIPHERAL VASCULAR CATHETERIZATION N/A 04/21/2015   Procedure: Abdominal Aortogram w/Lower Extremity;  Surgeon: Gary Dutch, MD;  Location: [place] CV LAB;  Service: Cardiovascular;  Laterality: N/A;  . Stent to right femoral artery    . TRIGGER FINGER RELEASE Bilateral   . VEIN HARVEST Right 10/23/2015   Procedure: [place];  Surgeon: Gary Dutch, MD;  Location: [place];  Service: Vascular;  Laterality: Right;     No Known Allergies    Family History  Problem Relation Age of Onset  . Heart attack Father   . Hyperlipidemia Father   . Diabetes Mother   . Diabetes Brother   . Colon cancer Neg Hx      Social History Gary Odonnell reports that he has been smoking cigars. He started smoking about 69 years ago. He has a 60.00 pack-year smoking history. He has never used smokeless tobacco. Gary Odonnell reports no history of alcohol use.   Review of Systems CONSTITUTIONAL: No weight loss, fever, chills, weakness or fatigue.  HEENT: Eyes: No visual  loss, blurred vision, double vision or yellow sclerae.No hearing loss, sneezing, congestion, runny nose or sore throat.  SKIN: No rash or itching.  CARDIOVASCULAR: no palpitations, no chest pain RESPIRATORY: No shortness of breath, cough or sputum.  GASTROINTESTINAL: No anorexia, nausea, vomiting or diarrhea. No abdominal pain or blood.  GENITOURINARY: No burning on urination, no polyuria NEUROLOGICAL: No headache, dizziness, syncope, paralysis, ataxia, numbness or tingling in the extremities. No change in bowel or bladder control.  MUSCULOSKELETAL: No muscle, back pain, joint pain or stiffness.  LYMPHATICS: No enlarged nodes. No history of splenectomy.  PSYCHIATRIC: No history of depression or anxiety.  ENDOCRINOLOGIC: No reports of sweating, cold or heat intolerance. No polyuria or polydipsia.  Marland Kitchen   Physical Examination Vitals:   12/29/18 0959  BP: (!) 144/69  Pulse: 76  SpO2: 90%   Vitals:   12/29/18 0959  Weight: 197 lb (89.4 kg)  Height: 5' 11.5" (1.816 m)    Gen: resting comfortably, no acute distress HEENT: no scleral icterus, pupils equal round and reactive, no palptable cervical adenopathy,  CV: irreg, no m/rg, no jvd Resp: coarse bilaterally, mild wheezing GI: abdomen is soft, non-tender, non-distended, normal bowel sounds, no hepatosplenomegaly MSK: extremities are warm, no edema.  Skin: warm, no rash Neuro:  no focal deficits Psych: appropriate affect   Diagnostic Studies 09/29/13 Cath Hemodynamic Findings: Ao: 162/70  LV: 163/19/29  RA: 12 RV: 66/7/16  PA: 67/26 (mean 43)  PCWP: 22  Fick Cardiac Output: 3.5 L/min  Fick Cardiac Index: 1.74 L/min/m2  Central Aortic Saturation: 91%  Pulmonary Artery Saturation: 50%  Angiographic Findings:  Left main: Distal 30% stenosis.  Left Anterior Descending Artery: 100% proximal occlusion. Proximal, mid and distal vessel fills from the patent IMA graft.  Circumflex Artery: Large caliber vessel with  large intermediate [name]. Diffuse non-obstructive plaque in the large bifurcating intermediate [name]. The ostium of the Circumflex has 50% stenosis.  Right Coronary Artery: Large dominant vessel with 100% mid occlusion. The distal vessel fills right to right bridging collaterals and left to right collaterals.  Graft Anatomy:  LIMA to LAD is patent  Free Radial (? Y graft from LIMA) to OM1, OM2, PDA is occluded.  Left Ventricular Angiogram: Deferred.  Impression:  1. Triple vessel CAD with patent LIMA to LAD, occluded RCA with good collateral filling, moderate disease in Circumflex  2. No focal targets for PCI  3. Ischemic cardiomyopathy with acute systolic CHF   18/2/99 Echo LVEF 15-20%, moderate LVH, severe diffuse hypokinesis, restrictive diastolic dysfunction, multiple WMAs, moderate MR, severe RV dysfunction RV TAPSE 0.7 cm, PASP 61 mmHg.   12/2013 Lab TC 175 TG 111 LDL 116 HDL 37  TSH 7.7 HgbA1c 6.6 Na 143 K 4.5 Cr 1.22 GFR56   04/2014 Echo Study Conclusions  - Left ventricle: The cavity size was normal. Wall thickness was increased  in a pattern of mild LVH. Systolic function was mildly to moderately reduced. The estimated ejection fraction was in the range of 40% to 45%. There is diastolic dysfunction, indeterminant grade. - Regional wall motion abnormality: Hypokinesis of the mid anterior and basal-mid anteroseptal myocardium. - Aortic valve: Moderately calcified annulus. Trileaflet; moderately thickened leaflets. Valve area (VTI): 2.48 cm^2. Valve area (Vmax): 2.23 cm^2. - Mitral valve: Moderately calcified annulus. Mildly thickened leaflets . - Left atrium: The atrium was moderately dilated. - Right atrium: The atrium was mildly dilated. - Technically adequate study.   01/2015 Carotid US bilaterall 1-39%    01/2018 echo Study Conclusions  - Left ventricle: The cavity size was normal. Wall thickness was increased in a pattern of moderate  LVH. Systolic function was moderately reduced. The estimated ejection fraction was in the range of 35% to 40%. Diffuse hypokinesis. Features are consistent with a pseudonormal left ventricular filling pattern, with concomitant abnormal relaxation and increased filling pressure (grade 2 diastolic dysfunction). Doppler parameters are consistent with high ventricular filling pressure. - Regional wall motion abnormality: Moderate hypokinesis of the basal-mid anteroseptal, mid inferoseptal, and mid inferior myocardium. - Aortic valve: Moderately calcified annulus. Mildly thickened, mildly calcified leaflets. There appears to be at least mild (if not mild to moderate) calcific aortic valvular stenosis. I suspect peak velocity and mean gradient is underestimated due to depressed LV function. Peak velocity (S): 159 cm/s. Mean gradient (S): 6 mm Hg. Valve area (VTI): 1.66 cm^2. Valve area (Vmax): 1.73 cm^2. Valve area (Vmean): 1.52 cm^2. - Mitral valve: Calcified annulus. Restricted leaflet motion due to depressed LV function. There was mild regurgitation. Valve area by continuity equation (using LVOT flow): 1.21 cm^2. - Left atrium: The atrium was mildly dilated. - Right ventricle: Systolic function was moderately reduced. - Tricuspid valve: There was mild regurgitation     Assessment and Plan  1. Afib - new diagnosis during recent ER visit. EKG today shows rate controlled afib as well - asymptomatic, rate controlled on coreg already - CHADS2Vasc score is 7, stop aspirin and start eliquis 5mg  bid - if remains in afib at 1 month f/u consider DCCV   2. Pneumonia - started on abx - some wheezing on exam, give 5 day course of prednisone 40mg     F/u 1 month    Arnoldo Lenis, M.D.

## 2019-01-05 ENCOUNTER — Emergency Department (HOSPITAL_COMMUNITY): Payer: Medicare Other

## 2019-01-05 ENCOUNTER — Inpatient Hospital Stay (HOSPITAL_COMMUNITY)
Admission: EM | Admit: 2019-01-05 | Discharge: 2019-01-08 | DRG: 193 | Disposition: A | Discharge status: Home Health | Payer: Medicare Other | Attending: Internal Medicine | Admitting: Internal Medicine

## 2019-01-05 ENCOUNTER — Encounter (HOSPITAL_COMMUNITY): Payer: Self-pay | Admitting: Emergency Medicine

## 2019-01-05 ENCOUNTER — Other Ambulatory Visit: Payer: Self-pay

## 2019-01-05 ENCOUNTER — Telehealth: Payer: Self-pay | Admitting: Cardiology

## 2019-01-05 DIAGNOSIS — J441 Chronic obstructive pulmonary disease with (acute) exacerbation: Secondary | ICD-10-CM | POA: Diagnosis present

## 2019-01-05 DIAGNOSIS — F17290 Nicotine dependence, other tobacco product, uncomplicated: Secondary | ICD-10-CM | POA: Diagnosis present

## 2019-01-05 DIAGNOSIS — I4891 Unspecified atrial fibrillation: Secondary | ICD-10-CM | POA: Diagnosis not present

## 2019-01-05 DIAGNOSIS — I5042 Chronic combined systolic (congestive) and diastolic (congestive) heart failure: Secondary | ICD-10-CM | POA: Diagnosis present

## 2019-01-05 DIAGNOSIS — Z79899 Other long term (current) drug therapy: Secondary | ICD-10-CM

## 2019-01-05 DIAGNOSIS — Z8673 Personal history of transient ischemic attack (TIA), and cerebral infarction without residual deficits: Secondary | ICD-10-CM

## 2019-01-05 DIAGNOSIS — I10 Essential (primary) hypertension: Secondary | ICD-10-CM | POA: Diagnosis not present

## 2019-01-05 DIAGNOSIS — R0902 Hypoxemia: Secondary | ICD-10-CM

## 2019-01-05 DIAGNOSIS — E785 Hyperlipidemia, unspecified: Secondary | ICD-10-CM | POA: Diagnosis present

## 2019-01-05 DIAGNOSIS — I447 Left bundle-branch block, unspecified: Secondary | ICD-10-CM | POA: Diagnosis not present

## 2019-01-05 DIAGNOSIS — E7800 Pure hypercholesterolemia, unspecified: Secondary | ICD-10-CM | POA: Diagnosis present

## 2019-01-05 DIAGNOSIS — I739 Peripheral vascular disease, unspecified: Secondary | ICD-10-CM | POA: Diagnosis present

## 2019-01-05 DIAGNOSIS — Z7901 Long term (current) use of anticoagulants: Secondary | ICD-10-CM | POA: Diagnosis not present

## 2019-01-05 DIAGNOSIS — Z923 Personal history of irradiation: Secondary | ICD-10-CM

## 2019-01-05 DIAGNOSIS — Z833 Family history of diabetes mellitus: Secondary | ICD-10-CM

## 2019-01-05 DIAGNOSIS — I2510 Atherosclerotic heart disease of native coronary artery without angina pectoris: Secondary | ICD-10-CM | POA: Diagnosis present

## 2019-01-05 DIAGNOSIS — Z66 Do not resuscitate: Secondary | ICD-10-CM | POA: Diagnosis present

## 2019-01-05 DIAGNOSIS — J189 Pneumonia, unspecified organism: Secondary | ICD-10-CM | POA: Diagnosis not present

## 2019-01-05 DIAGNOSIS — J9601 Acute respiratory failure with hypoxia: Secondary | ICD-10-CM | POA: Diagnosis present

## 2019-01-05 DIAGNOSIS — I480 Paroxysmal atrial fibrillation: Secondary | ICD-10-CM | POA: Diagnosis present

## 2019-01-05 DIAGNOSIS — Z85118 Personal history of other malignant neoplasm of bronchus and lung: Secondary | ICD-10-CM | POA: Diagnosis not present

## 2019-01-05 DIAGNOSIS — Z8349 Family history of other endocrine, nutritional and metabolic diseases: Secondary | ICD-10-CM | POA: Diagnosis not present

## 2019-01-05 DIAGNOSIS — F17210 Nicotine dependence, cigarettes, uncomplicated: Secondary | ICD-10-CM | POA: Diagnosis present

## 2019-01-05 DIAGNOSIS — Z720 Tobacco use: Secondary | ICD-10-CM

## 2019-01-05 DIAGNOSIS — Z951 Presence of aortocoronary bypass graft: Secondary | ICD-10-CM

## 2019-01-05 DIAGNOSIS — J449 Chronic obstructive pulmonary disease, unspecified: Secondary | ICD-10-CM | POA: Diagnosis present

## 2019-01-05 DIAGNOSIS — I110 Hypertensive heart disease with heart failure: Secondary | ICD-10-CM | POA: Diagnosis present

## 2019-01-05 DIAGNOSIS — Z7952 Long term (current) use of systemic steroids: Secondary | ICD-10-CM | POA: Diagnosis not present

## 2019-01-05 DIAGNOSIS — Z8249 Family history of ischemic heart disease and other diseases of the circulatory system: Secondary | ICD-10-CM | POA: Diagnosis not present

## 2019-01-05 DIAGNOSIS — R0602 Shortness of breath: Secondary | ICD-10-CM | POA: Diagnosis not present

## 2019-01-05 DIAGNOSIS — C3432 Malignant neoplasm of lower lobe, left bronchus or lung: Secondary | ICD-10-CM | POA: Diagnosis present

## 2019-01-05 DIAGNOSIS — R05 Cough: Secondary | ICD-10-CM | POA: Diagnosis not present

## 2019-01-05 DIAGNOSIS — I509 Heart failure, unspecified: Secondary | ICD-10-CM | POA: Diagnosis not present

## 2019-01-05 DIAGNOSIS — J9690 Respiratory failure, unspecified, unspecified whether with hypoxia or hypercapnia: Secondary | ICD-10-CM | POA: Diagnosis not present

## 2019-01-05 DIAGNOSIS — J440 Chronic obstructive pulmonary disease with acute lower respiratory infection: Secondary | ICD-10-CM | POA: Diagnosis present

## 2019-01-05 LAB — CBC WITH DIFFERENTIAL/PLATELET
Abs Immature Granulocytes: 0.02 10*3/uL (ref 0.00–0.07)
Basophils Absolute: 0 10*3/uL (ref 0.0–0.1)
Basophils Relative: 0 %
EOS PCT: 2 %
Eosinophils Absolute: 0.1 10*3/uL (ref 0.0–0.5)
HCT: 48.5 % (ref 39.0–52.0)
Hemoglobin: 14.1 g/dL (ref 13.0–17.0)
Immature Granulocytes: 0 %
Lymphocytes Relative: 16 %
Lymphs Abs: 1.1 10*3/uL (ref 0.7–4.0)
MCH: 29 pg (ref 26.0–34.0)
MCHC: 29.1 g/dL — ABNORMAL LOW (ref 30.0–36.0)
MCV: 99.6 fL (ref 80.0–100.0)
Monocytes Absolute: 0.3 10*3/uL (ref 0.1–1.0)
Monocytes Relative: 5 %
NRBC: 0 % (ref 0.0–0.2)
Neutro Abs: 5.3 10*3/uL (ref 1.7–7.7)
Neutrophils Relative %: 77 %
Platelets: 178 10*3/uL (ref 150–400)
RBC: 4.87 MIL/uL (ref 4.22–5.81)
RDW: 16.3 % — AB (ref 11.5–15.5)
WBC: 6.9 10*3/uL (ref 4.0–10.5)

## 2019-01-05 LAB — INFLUENZA PANEL BY PCR (TYPE A & B)
Influenza A By PCR: NEGATIVE
Influenza B By PCR: NEGATIVE

## 2019-01-05 LAB — COMPREHENSIVE METABOLIC PANEL
ALT: 46 U/L — ABNORMAL HIGH (ref 0–44)
AST: 21 U/L (ref 15–41)
Albumin: 4 g/dL (ref 3.5–5.0)
Alkaline Phosphatase: 64 U/L (ref 38–126)
Anion gap: 8 (ref 5–15)
BUN: 26 mg/dL — ABNORMAL HIGH (ref 8–23)
CO2: 31 mmol/L (ref 22–32)
CREATININE: 1.28 mg/dL — AB (ref 0.61–1.24)
Calcium: 9.7 mg/dL (ref 8.9–10.3)
Chloride: 105 mmol/L (ref 98–111)
GFR calc non Af Amer: 51 mL/min — ABNORMAL LOW (ref >60)
GFR, EST AFRICAN AMERICAN: 59 mL/min — AB (ref >60)
Glucose, Bld: 157 mg/dL — ABNORMAL HIGH (ref 70–99)
Potassium: 4.4 mmol/L (ref 3.5–5.1)
Sodium: 144 mmol/L (ref 135–145)
Total Bilirubin: 1.9 mg/dL — ABNORMAL HIGH (ref 0.3–1.2)
Total Protein: 7.1 g/dL (ref 6.5–8.1)

## 2019-01-05 LAB — LACTIC ACID, PLASMA
Lactic Acid, Venous: 1 mmol/L (ref 0.5–1.9)
Lactic Acid, Venous: 2 mmol/L (ref 0.5–1.9)
Lactic Acid, Venous: 1.2 mmol/L (ref 0.5–1.9)

## 2019-01-05 LAB — MRSA PCR SCREENING: MRSA BY PCR: POSITIVE — AB

## 2019-01-05 LAB — MAGNESIUM: Magnesium: 2.2 mg/dL (ref 1.7–2.4)

## 2019-01-05 MED ORDER — BUDESONIDE 0.5 MG/2ML IN SUSP
0.5000 mg | Freq: Two times a day (BID) | RESPIRATORY_TRACT | Status: DC
  Administered 2019-01-05 – 2019-01-08 (×6): 0.5 mg via RESPIRATORY_TRACT
  Filled 2019-01-05 (×6): qty 2

## 2019-01-05 MED ORDER — LORATADINE 10 MG PO TABS
10.0000 mg | ORAL_TABLET | Freq: Every day | ORAL | Status: DC
  Administered 2019-01-05 – 2019-01-08 (×4): 10 mg via ORAL
  Filled 2019-01-05 (×4): qty 1

## 2019-01-05 MED ORDER — SODIUM CHLORIDE 0.9 % IV BOLUS
1000.0000 mL | Freq: Once | INTRAVENOUS | Status: AC
  Administered 2019-01-05: 1000 mL via INTRAVENOUS

## 2019-01-05 MED ORDER — NICOTINE 14 MG/24HR TD PT24
14.0000 mg | MEDICATED_PATCH | Freq: Every day | TRANSDERMAL | Status: DC
  Administered 2019-01-05 – 2019-01-08 (×4): 14 mg via TRANSDERMAL
  Filled 2019-01-05 (×4): qty 1

## 2019-01-05 MED ORDER — LORATADINE 10 MG PO TABS: 10.0000 mg | ORAL_TABLET | Freq: Every day | ORAL | Status: DC

## 2019-01-05 MED ORDER — PANTOPRAZOLE SODIUM 40 MG PO TBEC
40.0000 mg | DELAYED_RELEASE_TABLET | Freq: Every day | ORAL | Status: DC
  Administered 2019-01-05 – 2019-01-08 (×3): 40 mg via ORAL
  Filled 2019-01-05 (×3): qty 1

## 2019-01-05 MED ORDER — TETRAHYDROZOLINE HCL 0.05 % OP SOLN
1.0000 [drp] | Freq: Every day | OPHTHALMIC | Status: DC | PRN
  Filled 2019-01-05: qty 15

## 2019-01-05 MED ORDER — CHLORHEXIDINE GLUCONATE CLOTH 2 % EX PADS
6.0000 | MEDICATED_PAD | Freq: Every day | CUTANEOUS | Status: DC
  Administered 2019-01-06 – 2019-01-08 (×3): 6 via TOPICAL

## 2019-01-05 MED ORDER — METHYLPREDNISOLONE SODIUM SUCC 125 MG IJ SOLR
60.0000 mg | Freq: Two times a day (BID) | INTRAMUSCULAR | Status: DC
  Administered 2019-01-05 – 2019-01-07 (×4): 60 mg via INTRAVENOUS
  Filled 2019-01-05 (×4): qty 2

## 2019-01-05 MED ORDER — SODIUM CHLORIDE 0.9 % IV SOLN
500.0000 mg | INTRAVENOUS | Status: DC
  Administered 2019-01-06 – 2019-01-07 (×2): 500 mg via INTRAVENOUS
  Filled 2019-01-05 (×2): qty 500

## 2019-01-05 MED ORDER — CARVEDILOL 12.5 MG PO TABS
12.5000 mg | ORAL_TABLET | Freq: Two times a day (BID) | ORAL | Status: DC
  Administered 2019-01-06 – 2019-01-07 (×3): 12.5 mg via ORAL
  Filled 2019-01-05 (×4): qty 1

## 2019-01-05 MED ORDER — FLUTICASONE PROPIONATE 50 MCG/ACT NA SUSP
2.0000 | Freq: Every day | NASAL | Status: DC
  Administered 2019-01-05 – 2019-01-08 (×4): 2 via NASAL
  Filled 2019-01-05: qty 16

## 2019-01-05 MED ORDER — ACETAMINOPHEN 325 MG PO TABS: 650.0000 mg | ORAL_TABLET | Freq: Four times a day (QID) | ORAL | Status: DC | PRN

## 2019-01-05 MED ORDER — SODIUM CHLORIDE 0.9% FLUSH
3.0000 mL | Freq: Two times a day (BID) | INTRAVENOUS | Status: DC
  Administered 2019-01-05 – 2019-01-08 (×6): 3 mL via INTRAVENOUS

## 2019-01-05 MED ORDER — GUAIFENESIN ER 600 MG PO TB12
1200.0000 mg | ORAL_TABLET | Freq: Two times a day (BID) | ORAL | Status: DC
  Administered 2019-01-05 – 2019-01-08 (×6): 1200 mg via ORAL
  Filled 2019-01-05 (×6): qty 2

## 2019-01-05 MED ORDER — HYDROCODONE-HOMATROPINE 5-1.5 MG/5ML PO SYRP: 5.0000 mL | ORAL_SOLUTION | Freq: Four times a day (QID) | ORAL | Status: DC | PRN

## 2019-01-05 MED ORDER — IPRATROPIUM-ALBUTEROL 0.5-2.5 (3) MG/3ML IN SOLN
3.0000 mL | Freq: Once | RESPIRATORY_TRACT | Status: AC
  Administered 2019-01-05: 3 mL via RESPIRATORY_TRACT
  Filled 2019-01-05: qty 3

## 2019-01-05 MED ORDER — IPRATROPIUM BROMIDE 0.02 % IN SOLN
0.5000 mg | Freq: Four times a day (QID) | RESPIRATORY_TRACT | Status: DC
  Administered 2019-01-05 – 2019-01-08 (×11): 0.5 mg via RESPIRATORY_TRACT
  Filled 2019-01-05 (×11): qty 2.5

## 2019-01-05 MED ORDER — EZETIMIBE 10 MG PO TABS
10.0000 mg | ORAL_TABLET | Freq: Every day | ORAL | Status: DC
  Administered 2019-01-06 – 2019-01-08 (×3): 10 mg via ORAL
  Filled 2019-01-05 (×2): qty 1

## 2019-01-05 MED ORDER — SODIUM CHLORIDE 0.9 % IV SOLN
2.0000 g | INTRAVENOUS | Status: DC
  Administered 2019-01-05: 2 g via INTRAVENOUS
  Filled 2019-01-05: qty 2

## 2019-01-05 MED ORDER — MAGNESIUM CITRATE PO SOLN: 1.0000 | Freq: Once | ORAL | Status: DC | PRN

## 2019-01-05 MED ORDER — LEVALBUTEROL HCL 0.63 MG/3ML IN NEBU
0.6300 mg | INHALATION_SOLUTION | Freq: Four times a day (QID) | RESPIRATORY_TRACT | Status: DC
  Administered 2019-01-05 – 2019-01-08 (×11): 0.63 mg via RESPIRATORY_TRACT
  Filled 2019-01-05 (×11): qty 3

## 2019-01-05 MED ORDER — LEVOFLOXACIN IN D5W 500 MG/100ML IV SOLN
500.0000 mg | Freq: Once | INTRAVENOUS | Status: AC
  Administered 2019-01-05: 500 mg via INTRAVENOUS
  Filled 2019-01-05: qty 100

## 2019-01-05 MED ORDER — SODIUM CHLORIDE 0.9 % IV SOLN
INTRAVENOUS | Status: AC
  Administered 2019-01-05 – 2019-01-06 (×2): via INTRAVENOUS

## 2019-01-05 MED ORDER — ACETAMINOPHEN 650 MG RE SUPP: 650.0000 mg | Freq: Four times a day (QID) | RECTAL | Status: DC | PRN

## 2019-01-05 MED ORDER — POLYETHYLENE GLYCOL 3350 17 G PO PACK: 17.0000 g | PACK | Freq: Every day | ORAL | Status: DC | PRN

## 2019-01-05 MED ORDER — APIXABAN 5 MG PO TABS
5.0000 mg | ORAL_TABLET | Freq: Two times a day (BID) | ORAL | Status: DC
  Administered 2019-01-05 – 2019-01-08 (×6): 5 mg via ORAL
  Filled 2019-01-05 (×5): qty 1
  Filled 2019-01-05: qty 2

## 2019-01-05 MED ORDER — HYDRALAZINE HCL 20 MG/ML IJ SOLN: 5.0000 mg | Freq: Four times a day (QID) | INTRAMUSCULAR | Status: DC | PRN

## 2019-01-05 MED ORDER — MUPIROCIN 2 % EX OINT
1.0000 "application " | TOPICAL_OINTMENT | Freq: Two times a day (BID) | CUTANEOUS | Status: DC
  Administered 2019-01-05 – 2019-01-08 (×6): 1 via NASAL
  Filled 2019-01-05: qty 22

## 2019-01-05 MED ORDER — SODIUM CHLORIDE 0.9 % IV BOLUS
500.0000 mL | Freq: Once | INTRAVENOUS | Status: AC
  Administered 2019-01-05: 500 mL via INTRAVENOUS

## 2019-01-05 MED ORDER — ONDANSETRON HCL 4 MG/2ML IJ SOLN: 4.0000 mg | Freq: Four times a day (QID) | INTRAMUSCULAR | Status: DC | PRN

## 2019-01-05 MED ORDER — POTASSIUM CHLORIDE CRYS ER 10 MEQ PO TBCR
10.0000 meq | EXTENDED_RELEASE_TABLET | Freq: Every day | ORAL | Status: DC
  Administered 2019-01-05 – 2019-01-08 (×4): 10 meq via ORAL
  Filled 2019-01-05 (×4): qty 1

## 2019-01-05 MED ORDER — SACUBITRIL-VALSARTAN 97-103 MG PO TABS
1.0000 | ORAL_TABLET | Freq: Two times a day (BID) | ORAL | Status: DC
  Administered 2019-01-05 – 2019-01-08 (×6): 1 via ORAL
  Filled 2019-01-05 (×6): qty 1

## 2019-01-05 MED ORDER — ONDANSETRON HCL 4 MG PO TABS: 4.0000 mg | ORAL_TABLET | Freq: Four times a day (QID) | ORAL | Status: DC | PRN

## 2019-01-05 MED ORDER — BISACODYL 10 MG RE SUPP: 10.0000 mg | Freq: Every day | RECTAL | Status: DC | PRN

## 2019-01-05 NOTE — Telephone Encounter (Signed)
Pt aware that oxygen would need to come from pulmonology or pcp - voice understanding

## 2019-01-05 NOTE — H&P (Signed)
History and Physical    Gary Odonnell YFV:[id number] DOB: [birth year] DOA: 01/05/2019  PCP: Glenda Chroman, MD  Patient coming from: Home  I have personally briefly reviewed patient's old medical records in [place]  Chief Complaint: Shortness of breath  HPI: Gary Odonnell is a pleasant 85 y.o. male with medical history significant of recently diagnosed A. fib during ER visit on 12/28/2019 patient was diagnosed with a pneumonia, history of left lung cancer status post radiation, coronary artery disease, hyperlipidemia, hypertension, peripheral artery disease who presents to the ED with worsening shortness of breath and non-productive persistent cough.  Patient was seen in the ED on 12/27/2018 diagnosed with pneumonia and discharged on Augmentin.  Patient states he completed course of Augmentin however still with ongoing nonproductive cough and shortness of breath.  Patient denies any fevers, no chills, no nausea, no vomiting, no diarrhea, no constipation, no dysuria, no melena, no hematemesis, no hematochezia, no syncope.  Patient does endorse occasional abdominal discomfort, generalized weakness, near syncopal episode.  ED Course: Patient seen in the ED, VBG obtained with a pH of 7.34, PCO2 of 58, O2 of 31, lactic acid of 1, comprehensive metabolic profile with a glucose of 157 BUN of 26 creatinine of 1.28 ALT of 46 bilirubin of 1.9 otherwise was within normal limits.  CBC within normal limits.  Chest x-ray done noted worsening bilateral lower lobe pneumonia left greater than right, stable bilateral pleural effusions.  Patient given IV Levaquin.  Hospitalist were called to admit the patient for further evaluation and management.  On presentation the ED patient noted to be hypoxic with sats of 82% on room air with blood pressure ranging from 162/76-192/82.  Review of Systems: As per HPI otherwise 10 point review of systems negative.  Past Medical History:  Diagnosis Date  . Cancer ([place])    L  lung  . Coronary artery disease   . Hypercholesteremia   . Hypertension   . PAD (peripheral artery disease) ([place])   . Pneumonia 09/2013  . Radiation april 2016  . Solitary pulmonary nodule on lung CT 12/13/13   11/15/14  . Stroke [facility]) 11/2008    Past Surgical History:  Procedure Laterality Date  . Angioplasty to Left femoral artery    . CARPAL TUNNEL RELEASE Left 05/11/2014   Procedure: LEFT CARPAL TUNNEL RELEASE;  Surgeon: Carole Civil, MD;  Location: [facility];  Service: Orthopedics;  Laterality: Left;  . COLONOSCOPY N/A 01/24/2015   Procedure: COLONOSCOPY;  Surgeon: Danie Binder, MD;  [facility];  Service: Endoscopy;  Laterality: N/A;  130  . CORONARY ARTERY BYPASS GRAFT     x4  . FEMORAL-POPLITEAL BYPASS GRAFT Right 10/23/2015   Procedure: BYPASS GRAFT FEMORAL TO BELOW KNEE POPLITEAL ARTERY USING RIGHT NON-REVERSED GREATER SAPPHENOUS VEIN;  Surgeon: [name], MD;  Location: [place];  Service: Vascular;  Laterality: Right;  . INTRAOPERATIVE ARTERIOGRAM Right 10/23/2015   Procedure: INTRA OPERATIVE ARTERIOGRAM - RIGHT LOWER LEG;  Surgeon: [name], MD;  Location: [place];  Service: Vascular;  Laterality: Right;  . KNEE ARTHROSCOPY Right   . LEFT AND RIGHT HEART CATHETERIZATION WITH CORONARY ANGIOGRAM N/A 09/29/2013   Procedure: LEFT AND RIGHT HEART CATHETERIZATION WITH CORONARY ANGIOGRAM;  Surgeon: Burnell Blanks, MD;  Location: [facility] CATH LAB;  Service: Cardiovascular;  Laterality: N/A;  . PERIPHERAL VASCULAR CATHETERIZATION N/A 04/21/2015   Procedure: Abdominal Aortogram w/Lower Extremity;  Surgeon: [name], MD;  Location: [place]  CV LAB;  Service: Cardiovascular;  Laterality: N/A;  . Stent to right femoral artery    . TRIGGER FINGER RELEASE Bilateral   . VEIN HARVEST Right 10/23/2015   Procedure: VEIN HARVEST - RIGHT GREATER SAPPHENOUS;  Surgeon: [name], MD;  Location: [place];  Service: Vascular;  Laterality: Right;      reports that he has been smoking cigars. He started smoking about 69 years ago. He has a 60.00 pack-year smoking history. He has never used smokeless tobacco. He reports that he does not drink alcohol or use drugs.  No Known Allergies  Family History  Problem Relation Age of Onset  . Heart attack Father   . Hyperlipidemia Father   . Diabetes Mother   . Diabetes Brother   . Colon cancer Neg Hx    Mother deceased age 9 from natural causes.  Father deceased at age 61 patient not sure of the cause.  Prior to Admission medications   Medication Sig Start Date End Date Taking? Authorizing Provider  apixaban (ELIQUIS) 5 MG TABS tablet Take 1 tablet (5 mg total) by mouth 2 (two) times daily. 12/29/18  Yes BranchAlphonse Guild, MD  Carboxymethylcellulose Sodium (EYE DROPS OP) Apply 1 drop to eye daily as needed (dry eyes).    Yes [provider]  carvedilol (COREG) 25 MG tablet TAKE ONE-HALF (1/2) TABLET TWICE A DAY WITH MEALS Patient taking differently: Take 12.5 mg by mouth 2 (two) times daily with a meal.  01/26/18  Yes Branch, Alphonse Guild, MD  ezetimibe (ZETIA) 10 MG tablet Take 1 tablet (10 mg total) by mouth daily. 11/16/18  Yes Branch, Alphonse Guild, MD  potassium chloride (K-DUR,KLOR-CON) 10 MEQ tablet Take 1 tablet (10 mEq total) by mouth daily. 10/01/13  Yes Barrett, Evelene Croon, PA-C  sacubitril-valsartan (ENTRESTO) 97-103 MG Take 1 tablet by mouth 2 (two) times daily. 11/16/18  Yes BranchAlphonse Guild, MD  amoxicillin-clavulanate (AUGMENTIN) 875-125 MG tablet Take 1 tablet by mouth 2 (two) times daily. One po bid x 7 days Patient not taking: Reported on 12/29/2018 12/27/18   Daleen Bo, MD  predniSONE (DELTASONE) 20 MG tablet TAKE 2 TABLETS DAILY FOR 5 DAYS Patient not taking: Reported on 01/05/2019 12/29/18   Arnoldo Lenis, MD    Physical Exam: Vitals:   01/05/19 1500 01/05/19 1530 01/05/19 1600 01/05/19 1709  BP: (!) 178/93 (!) 192/82 (!) 180/83 (!) 162/76  Pulse: 63  64 64  Resp:  (!) 23 20 20 19   Temp:      TempSrc:      SpO2: 100%  100% 100%  Weight:      Height:        Constitutional: NAD, calm, comfortable Vitals:   01/05/19 1500 01/05/19 1530 01/05/19 1600 01/05/19 1709  BP: (!) 178/93 (!) 192/82 (!) 180/83 (!) 162/76  Pulse: 63  64 64  Resp: (!) 23 20 20 19   Temp:      TempSrc:      SpO2: 100%  100% 100%  Weight:      Height:       Eyes: PERRL, lids and conjunctivae normal ENMT: Mucous membranes are dry.  Poor dentition.Marland Kitchen Posterior pharynx clear of any exudate or lesions. Neck: normal, supple, no masses, no thyromegaly Respiratory: Coarse breath sounds diffusely left greater than right.  Expiratory wheezing.  No crackles.  Normal respiratory effort.  No use of accessory muscles of respiration.  Speaking in full sentences.   Cardiovascular: Irregularly irregular.  No murmurs  rubs or gallops.  No lower extremity edema.  No carotid bruits.  2+ pedal pulses.  Abdomen: no tenderness, no masses palpated. No hepatosplenomegaly. Bowel sounds positive.  Musculoskeletal: no clubbing / cyanosis. No joint deformity upper and lower extremities. Good ROM, no contractures. Normal muscle tone.  Skin: no rashes, lesions, ulcers. No induration Neurologic: CN 2-12 grossly intact. Sensation intact, DTR normal. Strength 5/5 in all 4.  Psychiatric: Normal judgment and insight. Alert and oriented x 3. Normal mood.  Labs on Admission: I have personally reviewed following labs and imaging studies  CBC: Recent Labs  Lab 01/05/19 1502  WBC 6.9  NEUTROABS 5.3  HGB 14.1  HCT 48.5  MCV 99.6  PLT 585   Basic Metabolic Panel: Recent Labs  Lab 01/05/19 1502  NA 144  K 4.4  CL 105  CO2 31  GLUCOSE 157*  BUN 26*  CREATININE 1.28*  CALCIUM 9.7   GFR: Estimated Creatinine Clearance: 46.5 mL/min (A) (by C-G formula based on SCr of 1.28 mg/dL (H)). Liver Function Tests: Recent Labs  Lab 01/05/19 1502  AST 21  ALT 46*  ALKPHOS 64  BILITOT 1.9*  PROT 7.1    ALBUMIN 4.0   No results for input(s): LIPASE, AMYLASE in the last 168 hours. No results for input(s): AMMONIA in the last 168 hours. Coagulation Profile: No results for input(s): INR, PROTIME in the last 168 hours. Cardiac Enzymes: No results for input(s): CKTOTAL, CKMB, CKMBINDEX, TROPONINI in the last 168 hours. BNP (last 3 results) No results for input(s): PROBNP in the last 8760 hours. HbA1C: No results for input(s): HGBA1C in the last 72 hours. CBG: No results for input(s): GLUCAP in the last 168 hours. Lipid Profile: No results for input(s): CHOL, HDL, LDLCALC, TRIG, CHOLHDL, LDLDIRECT in the last 72 hours. Thyroid Function Tests: No results for input(s): TSH, T4TOTAL, FREET4, T3FREE, THYROIDAB in the last 72 hours. Anemia Panel: No results for input(s): VITAMINB12, FOLATE, FERRITIN, TIBC, IRON, RETICCTPCT in the last 72 hours. Urine analysis:    Component Value Date/Time   COLORURINE YELLOW 09/27/2016 1154   APPEARANCEUR CLEAR 09/27/2016 1154   LABSPEC <1.005 (L) 09/27/2016 1154   PHURINE 6.0 09/27/2016 1154   GLUCOSEU NEGATIVE 09/27/2016 1154   HGBUR TRACE (A) 09/27/2016 1154   BILIRUBINUR NEGATIVE 09/27/2016 1154   KETONESUR NEGATIVE 09/27/2016 1154   PROTEINUR NEGATIVE 09/27/2016 1154   NITRITE NEGATIVE 09/27/2016 1154   LEUKOCYTESUR NEGATIVE 09/27/2016 1154    Radiological Exams on Admission: Dg Chest 2 View  Result Date: 01/05/2019 CLINICAL DATA:  85 year old with acute cough and shortness of breath. Current smoker. EXAM: CHEST - 2 VIEW COMPARISON:  12/27/2018 and earlier, including CT chest 07/04/2015. FINDINGS: Prior sternotomy. Cardiac silhouette moderately enlarged, unchanged. Thoracic aorta atherosclerotic, unchanged. Hilar and mediastinal contours otherwise unremarkable. Airspace consolidation in the BILATERAL lower lobes, LEFT greater than RIGHT, increased since the chest x-ray 9 days ago. Lungs remain clear otherwise. Normal pulmonary vascularity. Stable  BILATERAL pleural effusions. Remote T12 compression fracture. IMPRESSION: Worsening bilateral lower lobe pneumonia, LEFT greater than RIGHT. Stable BILATERAL pleural effusions. Electronically Signed   By: Evangeline Dakin M.D.   On: 01/05/2019 14:33    EKG: Independently reviewed.  A. fib with left bundle branch block.  Assessment/Plan Principal Problem:   CAP (community acquired pneumonia) Active Problems:   HTN (hypertension)   CHF (congestive heart failure) (HCC)   CAD (coronary atherosclerotic disease)   S/P CABG x 4   Primary cancer of left lower lobe of  lung ([place])   PAD (peripheral artery disease) (HCC)   COPD with acute exacerbation (HCC)   A-fib (HCC)   1 community-acquired pneumonia Patient recently diagnosed with pneumonia on 12/27/2018 discharged from the ED on oral Augmentin and completed a course however still with persistent cough known productive shortness of breath.  Repeat chest x-ray done showed worsening left greater than right sided pneumonia.  Patient noted to be hypoxic in the ED with sats of 82% on room air.  Patient failed outpatient treatment.  Will admit the patient.  Check a sputum Gram stain and culture, check a urine Legionella antigen, check a urine pneumococcus antigen.  Blood cultures have been ordered.  Check an influenza PCR.  Placed on droplet precaution until influenza PCR is back.  Place empirically on IV cefepime, IV azithromycin, Pulmicort, Flonase, Claritin, Mucinex, scheduled Xopenex and Atrovent nebs, Protonix.  Supportive care.  2.  COPD with acute exacerbation Likely triggered by problem #1.  Patient with wheezing noted on examination.  Patient with extensive tobacco history with ongoing tobacco use.  Placed on Solu-Medrol 60 mg IV every 12 hours, Pulmicort, scheduled Xopenex and Atrovent nebs, IV antibiotics.  Follow.  3.  Recent diagnosis of A. Fib-- CHA2DS2VASC score 7 Patient recently diagnosed with A. fib during ER visit.  Currently rate  controlled on home regimen of Coreg.  Continue Eliquis.  Patient was seen by cardiology on 12/29/2018 and was recommended to continue Coreg and Eliquis and if A. fib remained after a month on follow-up may need to consider DCCV.  Outpatient follow-up with cardiology.  4.  Hypertension Continue home regimen of Coreg.  Hydralazine as needed.  5.  Peripheral artery disease Check a fasting lipid panel.  Continue Zetia, Eliquis.  Tobacco cessation.  Follow.  6.  Tobacco abuse Tobacco cessation.  Placed on a nicotine patch.  7.  Coronary artery disease/status post CABG/history of CHF Stable.  Continue Zetia, Eliquis, Coreg, Entresto.   DVT prophylaxis: Eliquis Code Status: DNR Family Communication: Updated patient.  No family at bedside. Disposition Plan: Likely home when clinically improved, no longer hypoxic. Consults called: None Admission status: Admit to inpatient.  Telemetry.   Irine Seal MD [facility]  If 7PM-7AM, please contact night-coverage [email or web address]  01/05/2019, 6:25 PM

## 2019-01-05 NOTE — ED Provider Notes (Signed)
85 yo M with a chief complaint of persistent cough and shortness of breath.  This been going on for the past month.  Was seen in the ED and started on antibiotics.  Per the family has had no improvement.  Was noted to be hypoxic on arrival to the ED here today.  Lactate is normal chest x-ray showing a worsening pneumonia.  Started on Levaquin, will discuss with the hospitalist for admission.  My exam the patient is well-appearing and nontoxic.  He is not tachypneic.  He is currently getting a breathing treatment and satting 100 percent.   Deno Etienne, DO 01/05/19 2251

## 2019-01-05 NOTE — Progress Notes (Signed)
CRITICAL VALUE STICKER  CRITICAL VALUE: MRSA PCR +  DATE & TIME NOTIFIED: 01/05/2019  2145   Standing orders were initiated   Will continue to monitor the pt

## 2019-01-05 NOTE — Progress Notes (Addendum)
Pharmacy Antibiotic Note  Gary Odonnell is a 85 y.o. male admitted on 01/05/2019 with pneumonia.  Pharmacy has been consulted for Cefepime dosing.  Plan: Cefepime 2gm q24 Azithromycin 500mg  IV q24  Height: 5' 11.5" (181.6 cm) Weight: 196 lb (88.9 kg) IBW/kg (Calculated) : 76.45  Temp (24hrs), Avg:97.5 F (36.4 C), Min:97.5 F (36.4 C), Max:97.5 F (36.4 C)  Recent Labs  Lab 01/05/19 1502 01/05/19 1620  WBC 6.9  --   CREATININE 1.28*  --   LATICACIDVEN  --  1.0    Estimated Creatinine Clearance: 46.5 mL/min (A) (by C-G formula based on SCr of 1.28 mg/dL (H)).    No Known Allergies  Antimicrobials this admission: 2/11 Levaquin x1 in ED 2/11 Cefepime >>  2/11 Azithromycin >>  Dose adjustments this admission:  Microbiology results: 2/11 BCx: sent Strep pneumo: ordered Legionella: ordered Flu PCR panel: ordered SputumCx: ordered  Thank you for allowing pharmacy to be a part of this patient's care.  Minda Ditto 01/05/2019 5:55 PM

## 2019-01-05 NOTE — Telephone Encounter (Signed)
Gary Odonnell ( wife) called requesting Dr. Harl Bowie to order oxygen on patient. States that he cannot breath.

## 2019-01-05 NOTE — ED Provider Notes (Signed)
[facility] DEPT Provider Note   CSN: [id number] Arrival date & time: 01/05/19  1333     History   Chief Complaint Chief Complaint  Patient presents with  . Shortness of Breath    HPI Gary Odonnell is a 85 y.o. male.  HPI   He is here for evaluation of persistent cough, nonproductive, with shortness of breath.  He was treated with Augmentin twice a day, for pneumonia, on 12/27/2018.  He states he completed this treatment.  He denies nausea, vomiting, focal weakness or paresthesia.  He has not been eating much because "food does not taste good."  He continues to smoke cigarettes.  He does not use oxygen at home.  There are no other known modifying factors.  Past Medical History:  Diagnosis Date  . Cancer ([place])    L lung  . Coronary artery disease   . Hypercholesteremia   . Hypertension   . PAD (peripheral artery disease) ([place])   . Pneumonia 09/2013  . Radiation april 2016  . Solitary pulmonary nodule on lung CT 12/13/13   11/15/14  . Stroke [facility]) 11/2008    Patient Active Problem List   Diagnosis Date Noted  . Left shoulder pain 09/01/2018  . Iliac artery stenosis, left ([place]) 04/22/2015  . PAD (peripheral artery disease) ([place]) 04/21/2015  . Primary cancer of left lower lobe of lung ([place]) 02/02/2015  . Abnormal PET scan of colon 01/04/2015  . Solitary pulmonary nodule on lung CT   . Carpal tunnel syndrome, left 05/03/2014  . Acute renal failure ([place]) 09/27/2013  . HTN (hypertension) 09/26/2013  . CHF (congestive heart failure) ([place]) 09/26/2013  . CVA (cerebral infarction) 09/26/2013  . CAP (community acquired pneumonia) 09/26/2013  . CAD (coronary atherosclerotic disease) 09/26/2013  . S/P CABG x 4 09/26/2013    Past Surgical History:  Procedure Laterality Date  . Angioplasty to Left femoral artery    . CARPAL TUNNEL RELEASE Left 05/11/2014   Procedure: LEFT CARPAL TUNNEL RELEASE;  Surgeon: Carole Civil, MD;  Location: [facility] ORS;   Service: Orthopedics;  Laterality: Left;  . COLONOSCOPY N/A 01/24/2015   Procedure: COLONOSCOPY;  Surgeon: Danie Binder, MD;  Location: [facility] ENDO SUITE;  Service: Endoscopy;  Laterality: N/A;  130  . CORONARY ARTERY BYPASS GRAFT     x4  . FEMORAL-POPLITEAL BYPASS GRAFT Right 10/23/2015   Procedure: BYPASS GRAFT FEMORAL TO BELOW KNEE POPLITEAL ARTERY USING RIGHT NON-REVERSED GREATER SAPPHENOUS VEIN;  Surgeon: [name], MD;  Location: [facility];  Service: Vascular;  Laterality: Right;  . INTRAOPERATIVE ARTERIOGRAM Right 10/23/2015   Procedure: INTRA OPERATIVE ARTERIOGRAM - RIGHT LOWER LEG;  Surgeon: [name], MD;  Location: [place] [facility];  Service: Vascular;  Laterality: Right;  . KNEE ARTHROSCOPY Right   . LEFT AND RIGHT HEART CATHETERIZATION WITH CORONARY ANGIOGRAM N/A 09/29/2013   Procedure: LEFT AND RIGHT HEART CATHETERIZATION WITH CORONARY ANGIOGRAM;  Surgeon: Burnell Blanks, MD;  Location: [facility] CATH LAB;  Service: Cardiovascular;  Laterality: N/A;  . PERIPHERAL VASCULAR CATHETERIZATION N/A 04/21/2015   Procedure: Abdominal Aortogram w/Lower Extremity;  Surgeon: [name], MD;  Location: [facility] CV LAB;  Service: Cardiovascular;  Laterality: N/A;  . Stent to right femoral artery    . TRIGGER FINGER RELEASE Bilateral   . VEIN HARVEST Right 10/23/2015   Procedure: [place];  Surgeon: [name], MD;  Location: [place];  Service: Vascular;  Laterality: Right;  Home Medications    Prior to Admission medications   Medication Sig Start Date End Date Taking? Authorizing Provider  apixaban (ELIQUIS) 5 MG TABS tablet Take 1 tablet (5 mg total) by mouth 2 (two) times daily. 12/29/18  Yes BranchAlphonse Guild, MD  Carboxymethylcellulose Sodium (EYE DROPS OP) Apply 1 drop to eye daily as needed (dry eyes).    Yes [provider]  carvedilol (COREG) 25 MG tablet TAKE ONE-HALF (1/2) TABLET TWICE A DAY WITH MEALS Patient taking  differently: Take 12.5 mg by mouth 2 (two) times daily with a meal.  01/26/18  Yes Branch, Alphonse Guild, MD  ezetimibe (ZETIA) 10 MG tablet Take 1 tablet (10 mg total) by mouth daily. 11/16/18  Yes Branch, Alphonse Guild, MD  potassium chloride (K-DUR,KLOR-CON) 10 MEQ tablet Take 1 tablet (10 mEq total) by mouth daily. 10/01/13  Yes Barrett, Evelene Croon, PA-C  sacubitril-valsartan (ENTRESTO) 97-103 MG Take 1 tablet by mouth 2 (two) times daily. 11/16/18  Yes BranchAlphonse Guild, MD  amoxicillin-clavulanate (AUGMENTIN) 875-125 MG tablet Take 1 tablet by mouth 2 (two) times daily. One po bid x 7 days Patient not taking: Reported on 12/29/2018 12/27/18   Daleen Bo, MD  predniSONE (DELTASONE) 20 MG tablet TAKE 2 TABLETS DAILY FOR 5 DAYS Patient not taking: Reported on 01/05/2019 12/29/18   Arnoldo Lenis, MD    Family History Family History  Problem Relation Age of Onset  . Heart attack Father   . Hyperlipidemia Father   . Diabetes Mother   . Diabetes Brother   . Colon cancer Neg Hx     Social History Social History   Tobacco Use  . Smoking status: Current Every Day Smoker    Packs/day: 1.00    Years: 60.00    Pack years: 60.00    Types: Cigars    Start date: 11/[birth year]/1949  . Smokeless tobacco: Never Used  Substance Use Topics  . Alcohol use: No    Alcohol/week: 0.0 standard drinks  . Drug use: No     Allergies   Patient has no known allergies.   Review of Systems Review of Systems  All other systems reviewed and are negative.    Physical Exam Updated Vital Signs BP (!) 180/83   Pulse 64   Temp (!) 97.5 F (36.4 C) (Oral)   Resp 20   Ht 5' 11.5" (1.816 m)   Wt 88.9 kg   SpO2 100%   BMI 26.96 kg/m   Physical Exam Vitals signs and nursing note reviewed.  Constitutional:      General: He is not in acute distress.    Appearance: He is well-developed. He is not ill-appearing, toxic-appearing or diaphoretic.  HENT:     Head: Normocephalic and atraumatic.     Right Ear:  External ear normal.     Left Ear: External ear normal.     Mouth/Throat:     Mouth: Mucous membranes are moist.     Pharynx: No oropharyngeal exudate or posterior oropharyngeal erythema.  Eyes:     Conjunctiva/sclera: Conjunctivae normal.     Pupils: Pupils are equal, round, and reactive to light.  Neck:     Musculoskeletal: Normal range of motion and neck supple.     Trachea: Phonation normal.  Cardiovascular:     Rate and Rhythm: Normal rate and regular rhythm.     Heart sounds: Normal heart sounds.  Pulmonary:     Effort: Pulmonary effort is normal. No respiratory distress.  Breath sounds: No stridor. Wheezing (Few) and rhonchi (Scattered) present. No rales.  Chest:     Chest wall: No tenderness.  Abdominal:     General: There is no distension.     Palpations: Abdomen is soft.     Tenderness: There is no abdominal tenderness.  Musculoskeletal: Normal range of motion.        General: No swelling or tenderness.  Skin:    General: Skin is warm and dry.  Neurological:     Mental Status: He is alert and oriented to person, place, and time.     Cranial Nerves: No cranial nerve deficit.     Sensory: No sensory deficit.     Motor: No abnormal muscle tone.     Coordination: Coordination normal.  Psychiatric:        Mood and Affect: Mood normal.        Behavior: Behavior normal.        Thought Content: Thought content normal.        Judgment: Judgment normal.      ED Treatments / Results  Labs (all labs ordered are listed, but only abnormal results are displayed) Labs Reviewed  COMPREHENSIVE METABOLIC PANEL - Abnormal; Notable for the following components:      Result Value   Glucose, Bld 157 (*)    BUN 26 (*)    Creatinine, Ser 1.28 (*)    ALT 46 (*)    Total Bilirubin 1.9 (*)    GFR calc non Af Amer 51 (*)    GFR calc Af Amer 59 (*)    All other components within normal limits  CBC WITH DIFFERENTIAL/PLATELET - Abnormal; Notable for the following components:    MCHC 29.1 (*)    RDW 16.3 (*)    All other components within normal limits  BLOOD GAS, VENOUS - Abnormal; Notable for the following components:   Bicarbonate 30.7 (*)    Acid-Base Excess 4.1 (*)    Acid-base deficit 5.3 (*)    All other components within normal limits  CULTURE, BLOOD (ROUTINE X 2)  CULTURE, BLOOD (ROUTINE X 2)  URINALYSIS, ROUTINE W REFLEX MICROSCOPIC  LACTIC ACID, PLASMA  LACTIC ACID, PLASMA    EKG EKG Interpretation  Date/Time:  Tuesday January 05 2019 13:53:20 EST Ventricular Rate:  65 PR Interval:    QRS Duration: 141 QT Interval:  439 QTC Calculation: 457 R Axis:   -60 Text Interpretation:  Atrial fibrillation Left bundle branch block since last tracing no significant change Confirmed by Daleen Bo [phone]) on 01/05/2019 3:14:29 PM   Radiology Dg Chest 2 View  Result Date: 01/05/2019 CLINICAL DATA:  85 year old with acute cough and shortness of breath. Current smoker. EXAM: CHEST - 2 VIEW COMPARISON:  12/27/2018 and earlier, including CT chest 07/04/2015. FINDINGS: Prior sternotomy. Cardiac silhouette moderately enlarged, unchanged. Thoracic aorta atherosclerotic, unchanged. Hilar and mediastinal contours otherwise unremarkable. Airspace consolidation in the BILATERAL lower lobes, LEFT greater than RIGHT, increased since the chest x-ray 9 days ago. Lungs remain clear otherwise. Normal pulmonary vascularity. Stable BILATERAL pleural effusions. Remote T12 compression fracture. IMPRESSION: Worsening bilateral lower lobe pneumonia, LEFT greater than RIGHT. Stable BILATERAL pleural effusions. Electronically Signed   By: Evangeline Dakin M.D.   On: 01/05/2019 14:33    Procedures .Critical Care Performed by: Daleen Bo, MD Authorized by: Daleen Bo, MD   Critical care provider statement:    Critical care time (minutes):  35   Critical care start time:  01/05/2019 2:40 PM  Critical care end time:  01/05/2019 4:36 PM   Critical care time was exclusive  of:  Separately billable procedures and treating other patients   Critical care was necessary to treat or prevent imminent or life-threatening deterioration of the following conditions:  Respiratory failure   Critical care was time spent personally by me on the following activities:  Blood draw for specimens, development of treatment plan with patient or surrogate, discussions with consultants, evaluation of patient's response to treatment, examination of patient, obtaining history from patient or surrogate, ordering and performing treatments and interventions, ordering and review of laboratory studies, pulse oximetry, re-evaluation of patient's condition, review of old charts and ordering and review of radiographic studies   (including critical care time)  Medications Ordered in ED Medications  ipratropium-albuterol (DUONEB) 0.5-2.5 (3) MG/3ML nebulizer solution 3 mL (has no administration in time range)  sodium chloride 0.9 % bolus 1,000 mL (1,000 mLs Intravenous New Bag/Given 01/05/19 1618)  levofloxacin (LEVAQUIN) IVPB 500 mg (0 mg Intravenous Stopped 01/05/19 1616)     Initial Impression / Assessment and Plan / ED Course  I have reviewed the triage vital signs and the nursing notes.  Pertinent labs & imaging results that were available during my care of the patient were reviewed by me and considered in my medical decision making (see chart for details).  Clinical Course as of Jan 05 1635  Tue Jan 05, 2019  1449 Nursing reported to me that oxygen saturation was 87% on room air on arrival to the ED.   [EW]  7824 Worsening bilateral pneumonias left greater than right with small effusions.  Images reviewed by me  DG Chest 2 View [EW]  2353 Normal except elevated bicarb, elevated acid base excess, elevated acid-base deficit  Blood gas, venous(!) [EW]  1611 Normal  CBC WITH DIFFERENTIAL(!) [EW]  1611 Normal except glucose high, BUN high, creatinine high  Comprehensive metabolic panel(!)  [EW]  6144 Abnormal, elevated  BP(!): 178/93 [EW]    Clinical Course User Index [EW] Daleen Bo, MD     Patient Vitals for the past 24 hrs:  BP Temp Temp src Pulse Resp SpO2 Height Weight  01/05/19 1600 (!) 180/83 - - 64 20 100 % - -  01/05/19 1530 (!) 192/82 - - - 20 - - -  01/05/19 1500 (!) 178/93 - - 63 (!) 23 100 % - -  01/05/19 1457 - - - - - - 5' 11.5" (1.816 m) 88.9 kg  01/05/19 1430 (!) 173/74 - - (!) 37 20 (!) 82 % - -  01/05/19 1353 (!) 176/75 (!) 97.5 F (36.4 C) Oral 63 19 97 % - -    4:35 PM Reevaluation with update and discussion. After initial assessment and treatment, an updated evaluation reveals no change in clinical status.  Findings discussed with patient.Daleen Bo   Medical Decision Making: Suspect pneumonia despite treatment as an outpatient.  Patient failed outpatient treatment and returns hypoxic.  Screening labs ordered.  Initial findings are reassuring.  Lactate pending   CRITICAL CARE-yes Performed by: Daleen Bo  Nursing Notes Reviewed/ Care Coordinated Applicable Imaging Reviewed Interpretation of Laboratory Data incorporated into ED treatment   Plan-admit by primary care after return of labs  Final Clinical Impressions(s) / ED Diagnoses   Final diagnoses:  Community acquired pneumonia, unspecified laterality  Hypoxia  Tobacco abuse    ED Discharge Orders    None       Daleen Bo, MD 01/05/19 1637

## 2019-01-05 NOTE — ED Notes (Signed)
ED TO INPATIENT HANDOFF REPORT  Name/Age/Gender Gary Odonnell 85 y.o. male  Code Status Code Status History    Date Active Date Inactive Code Status Order ID Comments User Context   10/23/2015 1529 10/25/2015 1547 Full Code [id number]  Gary Odonnell Inpatient   04/21/2015 1848 04/22/2015 1203 Full Code [id number]  Gary Odonnell Inpatient   04/21/2015 1846 04/21/2015 1848 Full Code [id number]  Gary Dutch, MD Inpatient   04/21/2015 1326 04/21/2015 1846 Full Code [id number]  Gary Dutch, MD Inpatient   12/27/2014 1213 12/28/2014 0352 Full Code [id number]  Gary Daft, MD HOV   09/26/2013 1112 10/01/2013 1755 Full Code [id number]  Gary Hazel, MD ED    Advance Directive Documentation     Most Recent Value  Type of Advance Directive  Healthcare Power of Attorney  Pre-existing out of facility DNR order (yellow form or pink MOST form)  -  "MOST" Form in Place?  -      Home/SNF/Other Home  Chief Complaint Shortness of Breath;Cough   Level of Care/Admitting Diagnosis ED Disposition    ED Disposition Condition [place]: [facility] [100102]  Level of Care: Telemetry [5]  Admit to tele based on following criteria: Complex arrhythmia (Bradycardia/Tachycardia)  Diagnosis: CAP (community acquired pneumonia) [671245]  Admitting Physician: Gary Odonnell [[id number]]  Attending Physician: Gary Odonnell [[id number]]  Estimated length of stay: past midnight tomorrow  Certification:: I certify this patient will need inpatient services for at least 2 midnights  PT Class (Do Not Modify): Inpatient [101]  PT Acc Code (Do Not Modify): Private [1]       Medical History Past Medical History:  Diagnosis Date  . Cancer ([place])    L lung  . Coronary artery disease   . Hypercholesteremia   . Hypertension   . PAD (peripheral artery disease) ([place])   . Pneumonia 09/2013  . Radiation april 2016  . Solitary pulmonary nodule on lung CT  12/13/13   11/15/14  . Stroke [facility]) 11/2008    Allergies No Known Allergies  IV Location/Drains/Wounds Patient Lines/Drains/Airways Status   Active Line/Drains/Airways    Name:   Placement date:   Placement time:   Site:   Days:   Peripheral IV 01/05/19 Left Antecubital   01/05/19    1442    Antecubital   less than 1   Peripheral IV 01/05/19 Right Antecubital   01/05/19    1513    Antecubital   less than 1          Labs/Imaging Results for orders placed or performed during the hospital encounter of 01/05/19 (from the past 48 hour(s))  Comprehensive metabolic panel     Status: Abnormal   Collection Time: 01/05/19  3:02 PM  Result Value Ref Range   Sodium 144 135 - 145 mmol/L   Potassium 4.4 3.5 - 5.1 mmol/L   Chloride 105 98 - 111 mmol/L   CO2 31 22 - 32 mmol/L   Glucose, Bld 157 (H) 70 - 99 mg/dL   BUN 26 (H) 8 - 23 mg/dL   Creatinine, Ser 1.28 (H) 0.61 - 1.24 mg/dL   Calcium 9.7 8.9 - 10.3 mg/dL   Total Protein 7.1 6.5 - 8.1 g/dL   Albumin 4.0 3.5 - 5.0 g/dL   AST 21 15 - 41 U/L   ALT 46 (H) 0 - 44 U/L   Alkaline Phosphatase 64 38 - 126 U/L  Total Bilirubin 1.9 (H) 0.3 - 1.2 mg/dL   GFR calc non Af Amer 51 (L) >60 mL/min   GFR calc Af Amer 59 (L) >60 mL/min   Anion gap 8 5 - 15    Comment: Performed at [facility], [place] [address] [place], [place] [address]  CBC WITH DIFFERENTIAL     Status: Abnormal   Collection Time: 01/05/19  3:02 PM  Result Value Ref Range   WBC 6.9 4.0 - 10.5 K/uL   RBC 4.87 4.22 - 5.81 MIL/uL   Hemoglobin 14.1 13.0 - 17.0 g/dL   HCT 48.5 39.0 - 52.0 %   MCV 99.6 80.0 - 100.0 fL   MCH 29.0 26.0 - 34.0 pg   MCHC 29.1 (L) 30.0 - 36.0 g/dL   RDW 16.3 (H) 11.5 - 15.5 %   Platelets 178 150 - 400 K/uL   nRBC 0.0 0.0 - 0.2 %   Neutrophils Relative % 77 %   Neutro Abs 5.3 1.7 - 7.7 K/uL   Lymphocytes Relative 16 %   Lymphs Abs 1.1 0.7 - 4.0 K/uL   Monocytes Relative 5 %   Monocytes Absolute 0.3 0.1 - 1.0 K/uL   Eosinophils  Relative 2 %   Eosinophils Absolute 0.1 0.0 - 0.5 K/uL   Basophils Relative 0 %   Basophils Absolute 0.0 0.0 - 0.1 K/uL   Immature Granulocytes 0 %   Abs Immature Granulocytes 0.02 0.00 - 0.07 K/uL    Comment: Performed at [facility], [place] [address], [place], [place] [address]  Blood gas, venous     Status: Abnormal (Preliminary result)   Collection Time: 01/05/19  3:11 PM  Result Value Ref Range   pH, Ven 7.34 7.250 - 7.430   pCO2, Ven 57.8 44.0 - 60.0 mmHg   pO2, Ven RBV 32.0 - 45.0 mmHg    Comment: RN CARI AT 1522 BY DEE WALTERS RRT ON 01/05/19 BELOW REPORTABLE RANGE RN CARI AT 0981 BY DEE WALTERS RRT ON 01/05/19    Bicarbonate 30.7 (H) 20.0 - 28.0 mmol/L   Acid-Base Excess 4.1 (H) 0.0 - 2.0 mmol/L   Acid-base deficit 5.3 (H) 0.0 - 2.0 mmol/L   O2 Saturation 30.9 %   Patient temperature 98.6    Collection site DRAWN BY RN    Drawn by DRAWN BY RN    Sample type VEIN     Comment: Performed at [facility], [place] [address], [place], [place] [address]   Mechanical Rate PENDING   Lactic acid, plasma     Status: None   Collection Time: 01/05/19  4:20 PM  Result Value Ref Range   Lactic Acid, Venous 1.0 0.5 - 1.9 mmol/L    Comment: Performed at [facility], [place] [address], [place],  [address]   Dg Chest 2 View  Result Date: 01/05/2019 CLINICAL DATA:  85 year old with acute cough and shortness of breath. Current smoker. EXAM: CHEST - 2 VIEW COMPARISON:  12/27/2018 and earlier, including CT chest 07/04/2015. FINDINGS: Prior sternotomy. Cardiac silhouette moderately enlarged, unchanged. Thoracic aorta atherosclerotic, unchanged. Hilar and mediastinal contours otherwise unremarkable. Airspace consolidation in the BILATERAL lower lobes, LEFT greater than RIGHT, increased since the chest x-ray 9 days ago. Lungs remain clear otherwise. Normal pulmonary vascularity. Stable BILATERAL pleural effusions. Remote T12 compression  fracture. IMPRESSION: Worsening bilateral lower lobe pneumonia, LEFT greater than RIGHT. Stable BILATERAL pleural effusions. Electronically Signed   By: Evangeline Dakin M.D.   On:  01/05/2019 14:33   EKG Interpretation  Date/Time:  Tuesday January 05 2019 13:53:20 EST Ventricular Rate:  65 PR Interval:    QRS Duration: 141 QT Interval:  439 QTC Calculation: 457 R Axis:   -60 Text Interpretation:  Atrial fibrillation Left bundle branch block since last tracing no significant change Confirmed by Daleen Bo [phone]) on 01/05/2019 3:14:29 PM   Pending Labs Unresulted Labs (From admission, onward)    Start     Ordered   01/05/19 1732  Influenza panel by PCR (type A & B)  (Influenza PCR Panel)  Once,   R     01/05/19 1733   01/05/19 1731  HIV antibody (Routine Screening)  Once,   R     01/05/19 1733   01/05/19 1731  Culture, sputum-assessment  Once,   R     01/05/19 1733   01/05/19 1731  Gram stain  Once,   R     01/05/19 1733   01/05/19 1731  Strep pneumoniae urinary antigen  Once,   R     01/05/19 1733   01/05/19 1731  Legionella Pneumophila Serogp 1 Ur Ag  Once,   R     01/05/19 1733   01/05/19 1730  Culture, Urine  Once,   R     01/05/19 1729   01/05/19 1544  Lactic acid, plasma  Now then every 2 hours,   STAT     01/05/19 1544   01/05/19 1442  Blood Culture (routine x 2)  BLOOD CULTURE X 2,   STAT     01/05/19 1444   01/05/19 1442  Urinalysis, Routine w reflex microscopic (not at [facility])  ONCE - STAT,   STAT     01/05/19 1444          Vitals/Pain Today's Vitals   01/05/19 1500 01/05/19 1530 01/05/19 1600 01/05/19 1709  BP: (!) 178/93 (!) 192/82 (!) 180/83 (!) 162/76  Pulse: 63  64 64  Resp: (!) 23 20 20 19   Temp:      TempSrc:      SpO2: 100%  100% 100%  Weight:      Height:      PainSc:        Isolation Precautions Droplet precaution  Medications Medications  azithromycin (ZITHROMAX) 500 mg in sodium chloride 0.9 % 250 mL IVPB (has no administration in  time range)  guaiFENesin (MUCINEX) 12 hr tablet 1,200 mg (has no administration in time range)  levofloxacin (LEVAQUIN) IVPB 500 mg (0 mg Intravenous Stopped 01/05/19 1616)  ipratropium-albuterol (DUONEB) 0.5-2.5 (3) MG/3ML nebulizer solution 3 mL (3 mLs Nebulization Given 01/05/19 1655)  sodium chloride 0.9 % bolus 1,000 mL (1,000 mLs Intravenous New Bag/Given 01/05/19 1618)    Mobility walks with device

## 2019-01-05 NOTE — ED Notes (Signed)
I have just given report to christina, RN on [place]. Will transport soon.

## 2019-01-05 NOTE — Progress Notes (Signed)
CRITICAL VALUE STICKER  CRITICAL VALUE: Lactic 2.0  DATE & TIME NOTIFIED: 01/05/2019 2011  MD NOTIFIED: [place]  TIME OF NOTIFICATION: 2053  RESPONSE: Awaiting response

## 2019-01-05 NOTE — ED Triage Notes (Signed)
Pt reports was here last week for SOB and was sent home and still having SOB. Pt reports that he has cough that is not productive

## 2019-01-06 ENCOUNTER — Inpatient Hospital Stay (HOSPITAL_COMMUNITY): Payer: Medicare Other

## 2019-01-06 DIAGNOSIS — I509 Heart failure, unspecified: Secondary | ICD-10-CM

## 2019-01-06 LAB — EXPECTORATED SPUTUM ASSESSMENT W GRAM STAIN, RFLX TO RESP C

## 2019-01-06 LAB — CBC
HCT: 42.7 % (ref 39.0–52.0)
Hemoglobin: 12.3 g/dL — ABNORMAL LOW (ref 13.0–17.0)
MCH: 29.9 pg (ref 26.0–34.0)
MCHC: 28.8 g/dL — ABNORMAL LOW (ref 30.0–36.0)
MCV: 103.6 fL — ABNORMAL HIGH (ref 80.0–100.0)
Platelets: 117 10*3/uL — ABNORMAL LOW (ref 150–400)
RBC: 4.12 MIL/uL — ABNORMAL LOW (ref 4.22–5.81)
RDW: 16.1 % — ABNORMAL HIGH (ref 11.5–15.5)
WBC: 5.7 10*3/uL (ref 4.0–10.5)
nRBC: 0 % (ref 0.0–0.2)

## 2019-01-06 LAB — URINALYSIS, ROUTINE W REFLEX MICROSCOPIC
Bacteria, UA: NONE SEEN
Bilirubin Urine: NEGATIVE
Glucose, UA: 50 mg/dL — AB
Hgb urine dipstick: NEGATIVE
Ketones, ur: NEGATIVE mg/dL
Leukocytes,Ua: NEGATIVE
Nitrite: NEGATIVE
Protein, ur: 100 mg/dL — AB
Specific Gravity, Urine: 1.026 (ref 1.005–1.030)
pH: 5 (ref 5.0–8.0)

## 2019-01-06 LAB — BASIC METABOLIC PANEL
Anion gap: 11 (ref 5–15)
BUN: 25 mg/dL — ABNORMAL HIGH (ref 8–23)
CALCIUM: 9.1 mg/dL (ref 8.9–10.3)
CO2: 22 mmol/L (ref 22–32)
Chloride: 108 mmol/L (ref 98–111)
Creatinine, Ser: 1.16 mg/dL (ref 0.61–1.24)
GFR calc Af Amer: >60 mL/min (ref >60)
GFR calc non Af Amer: 58 mL/min — ABNORMAL LOW (ref >60)
Glucose, Bld: 261 mg/dL — ABNORMAL HIGH (ref 70–99)
Potassium: 4.6 mmol/L (ref 3.5–5.1)
Sodium: 141 mmol/L (ref 135–145)

## 2019-01-06 LAB — EXPECTORATED SPUTUM ASSESSMENT W REFEX TO RESP CULTURE

## 2019-01-06 LAB — GLUCOSE, CAPILLARY: Glucose-Capillary: 257 mg/dL — ABNORMAL HIGH (ref 70–99)

## 2019-01-06 LAB — STREP PNEUMONIAE URINARY ANTIGEN: Strep Pneumo Urinary Antigen: NEGATIVE

## 2019-01-06 MED ORDER — SODIUM CHLORIDE 0.9 % IV SOLN
2.0000 g | Freq: Two times a day (BID) | INTRAVENOUS | Status: DC
  Administered 2019-01-06 – 2019-01-08 (×5): 2 g via INTRAVENOUS
  Filled 2019-01-06 (×6): qty 2

## 2019-01-06 NOTE — Evaluation (Signed)
Physical Therapy Evaluation Patient Details Name: Gary Odonnell MRN: [id number] DOB: 03-25-1932 Today's Date: 01/06/2019   History of Present Illness  85 y.o. male admitted with diagnoses of CAP, COPD exac. Hx of A fib, Lung ca, CAD, PAD, CVA, COPD  Clinical Impression  On eval, pt was Min guard assist for mobility. He walked ~30 feet with a RW. Distance was limited by chronic back pain and some dyspnea. O2 sat 89% on 2L [place] with activity. Pt plans to return home with his family assisting as needed. Will follow during hospital stay. Will recommend HHPT f/u if pt is agreeable. He also stated he is planning to resume his acupuncture treatments for his back pain once able.     Follow Up Recommendations Home health PT    Equipment Recommendations  None recommended by PT    Recommendations for Other Services       Precautions / Restrictions Precautions Precautions: Fall Precaution Comments: monitior O2 Restrictions Weight Bearing Restrictions: No      Mobility  Bed Mobility Overal bed mobility: Needs Assistance Bed Mobility: Supine to Sit;Sit to Supine     Supine to sit: Min assist;HOB elevated Sit to supine: Min guard   General bed mobility comments: Assist for trunk to upright. Increased time. Task was effortful for pt.   Transfers Overall transfer level: Needs assistance Equipment used: Rolling walker (2 wheeled) Transfers: Sit to/from Stand Sit to Stand: From elevated surface;Supervision Stand pivot transfers: Min guard       General transfer comment: VCs safety, hand placement.   Ambulation/Gait Ambulation/Gait assistance: Min guard Gait Distance (Feet): 30 Feet Assistive device: Rolling walker (2 wheeled) Gait Pattern/deviations: Step-through pattern;Trunk flexed     General Gait Details: Close guard for safety. VCs safety, posture, proper use of RW. Distance limited by back pain and some dyspnea noted. O2 sat 89% on 2L  Stairs            Wheelchair  Mobility    Modified Rankin (Stroke Patients Only)       Balance Overall balance assessment: Needs assistance Sitting-balance support: No upper extremity supported;Feet supported Sitting balance-Leahy Scale: Good Sitting balance - Comments: able to perform dynamic seated tasks   Standing balance support: No upper extremity supported Standing balance-Leahy Scale: Fair                               Pertinent Vitals/Pain Pain Assessment: Faces Faces Pain Scale: Hurts even more Pain Location: back pain with activity (chronic) Pain Descriptors / Indicators: Discomfort;Sore;Tightness;Grimacing Pain Intervention(s): Limited activity within patient's tolerance;Repositioned    Home Living Family/patient expects to be discharged to:: Private residence Living Arrangements: Spouse/significant other Available Help at Discharge: Family;Available 24 hours/day Type of Home: House Home Access: Stairs to enter Entrance Stairs-Rails: Right;Can reach both;Left Entrance Stairs-Number of Steps: 5 in the front, 9 in the back Home Layout: One level Home Equipment: Walker - 2 wheels;Cane - single point;Shower seat - built in      Prior Function Level of Independence: Independent with assistive device(s)         Comments: uses a SPC to get in and out of the house     Hand Dominance   Dominant Hand: Right(ambidextrous)    Extremity/Trunk Assessment   Upper Extremity Assessment Upper Extremity Assessment: Defer to OT evaluation    Lower Extremity Assessment Lower Extremity Assessment: Generalized weakness    Cervical / Trunk Assessment Cervical /  Trunk Assessment: Other exceptions Cervical / Trunk Exceptions: forward head, rounded shoulders  Communication   Communication: No difficulties  Cognition Arousal/Alertness: Awake/alert Behavior During Therapy: WFL for tasks assessed/performed Overall Cognitive Status: Within Functional Limits for tasks assessed                                         General Comments General comments (skin integrity, edema, etc.): Pt on 3 L O2 when OT entered, sitting EOB Pt dropped to 89% on RA, after transfer Pt was at 83% on RA. 3 L O2 re-applied and O2 saturations were at 93 and higher again - does require cues for breathing through nose    Exercises     Assessment/Plan    PT Assessment Patient needs continued PT services  PT Problem List Decreased strength;Decreased balance;Decreased mobility;Decreased activity tolerance;Decreased knowledge of use of DME       PT Treatment Interventions DME instruction;Gait training;Functional mobility training;Therapeutic activities;Balance training;Patient/family education;Therapeutic exercise    PT Goals (Current goals can be found in the Care Plan section)  Acute Rehab PT Goals Patient Stated Goal: to breathe better, go home PT Goal Formulation: With patient Time For Goal Achievement: 01/20/19 Potential to Achieve Goals: Good    Frequency Min 3X/week   Barriers to discharge        Co-evaluation               AM-PAC PT "6 Clicks" Mobility  Outcome Measure Help needed turning from your back to your side while in a flat bed without using bedrails?: A Little Help needed moving from lying on your back to sitting on the side of a flat bed without using bedrails?: A Little Help needed moving to and from a bed to a chair (including a wheelchair)?: A Little Help needed standing up from a chair using your arms (e.g., wheelchair or bedside chair)?: A Little Help needed to walk in hospital room?: A Little Help needed climbing 3-5 steps with a railing? : A Little 6 Click Score: 18    End of Session Equipment Utilized During Treatment: Gait belt;Oxygen Activity Tolerance: Patient limited by fatigue;Patient limited by pain Patient left: in bed;with call bell/phone within reach;with bed alarm set   PT Visit Diagnosis: Unsteadiness on feet (R26.81);Muscle  weakness (generalized) (M62.81);Difficulty in walking, not elsewhere classified (R26.2)    Time: 8768-1157 PT Time Calculation (min) (ACUTE ONLY): 15 min   Charges:   PT Evaluation $PT Eval Moderate Complexity: 1 Mod            Weston Anna, PT Acute Rehabilitation Services Pager: [phone] Office: [phone]  '

## 2019-01-06 NOTE — Evaluation (Signed)
Occupational Therapy Evaluation Patient Details Name: Gary Odonnell MRN: [id number] DOB: 1932-06-20 Today's Date: 01/06/2019    History of Present Illness Gary Odonnell is an 85 y.o. male with PMH including  A. fib, pneumonia, history of left lung cancer status post radiation, CAD, HTN, PAD, who presented to the ED with worsening SOB and non-productive persistent cough.  Patient was seen in the ED and discharged on 12/27/2018 diagnosed with pneumonia and discharged on Augmentin.   Clinical Impression   PTA Pt mod I - used SPC for stairs, but doing own ADL. Pt reports that he likes to sit around all day. Pt is currently min guard for stand pivot transfers from EOB to recliner, min guard for donning socks, and set up for UB ADL. Pt on 3 L O2 when OT entered, sitting EOB Pt dropped to 89% on RA, after transfer Pt was at 83% on RA. 3 L O2 re-applied and O2 saturations were at 93 and higher again - does require cues for breathing through nose. Pt reports several falls in the home (mostly getting up at night - tries to hurry) and BLE weakness "for going on 5 years now" Pt will benefit from skilled OT in the acute setting as well as afterwards at the [facility] level to focus on energy conservation, and safety in his home environment - as well as maintaining participation in meaningful occupations with family.     Follow Up Recommendations  Home health OT;Supervision/Assistance - 24 hour(initially)    Equipment Recommendations  None recommended by OT    Recommendations for Other Services       Precautions / Restrictions Precautions Precautions: Fall Precaution Comments: watch O2 Restrictions Weight Bearing Restrictions: No      Mobility Bed Mobility Overal bed mobility: Needs Assistance Bed Mobility: Supine to Sit     Supine to sit: Min guard;HOB elevated     General bed mobility comments: use of bed rail, min guard for safety  Transfers Overall transfer level: Needs  assistance Equipment used: None(for SPT only - would use for further gait) Transfers: Sit to/from [facility] Sit to Stand: Min guard Stand pivot transfers: Min guard       General transfer comment: small pivotal steps    Balance Overall balance assessment: Needs assistance Sitting-balance support: No upper extremity supported;Feet supported Sitting balance-Leahy Scale: Good Sitting balance - Comments: able to perform dynamic seated tasks   Standing balance support: No upper extremity supported Standing balance-Leahy Scale: Fair                             ADL either performed or assessed with clinical judgement   ADL Overall ADL's : Needs assistance/impaired Eating/Feeding: Modified independent;Sitting Eating/Feeding Details (indicate cue type and reason): able to self feed, open containers etc without problem Grooming: Min guard;Standing Grooming Details (indicate cue type and reason): decreased activity tolerance for standing grooming Upper Body Bathing: Set up;Sitting   Lower Body Bathing: Set up;Sitting/lateral leans   Upper Body Dressing : Supervision/safety   Lower Body Dressing: Min guard;Sit to/from stand Lower Body Dressing Details (indicate cue type and reason): able to don/doff socks EOB Toilet Transfer: Min Statistician Details (indicate cue type and reason): small pivotal steps Toileting- Water quality scientist and Hygiene: Min guard;Sit to/from stand       Functional mobility during ADLs: Min guard(no DME for SPT only) General ADL Comments: decreased activity tolerance, O2 dropped, history of  increasing falls at home     Vision Baseline Vision/History: Wears glasses Wears Glasses: Reading only Patient Visual Report: No change from baseline       Perception     Praxis      Pertinent Vitals/Pain Pain Assessment: No/denies pain(in sinus)     Hand Dominance Right(ambidextrous)    Extremity/Trunk Assessment Upper Extremity Assessment Upper Extremity Assessment: Generalized weakness   Lower Extremity Assessment Lower Extremity Assessment: Defer to PT evaluation   Cervical / Trunk Assessment Cervical / Trunk Assessment: Other exceptions Cervical / Trunk Exceptions: forward head, rounded shoulders   Communication Communication Communication: No difficulties   Cognition Arousal/Alertness: Awake/alert Behavior During Therapy: WFL for tasks assessed/performed Overall Cognitive Status: Within Functional Limits for tasks assessed                                     General Comments  Pt on 3 L O2 when OT entered, sitting EOB Pt dropped to 89% on RA, after transfer Pt was at 83% on RA. 3 L O2 re-applied and O2 saturations were at 93 and higher again - does require cues for breathing through nose    Exercises     Shoulder Instructions      Home Living Family/patient expects to be discharged to:: Private residence Living Arrangements: Spouse/significant other(and large dog) Available Help at Discharge: Family;Available 24 hours/day Type of Home: House Home Access: Stairs to enter [employer] of Steps: 5 in the front, 9 in the back Entrance Stairs-Rails: Right;Can reach both;Left Home Layout: One level     Bathroom Shower/Tub: Occupational psychologist: Handicapped height Bathroom Accessibility: No   Home Equipment: Environmental consultant - 2 wheels;Cane - single point;Shower seat - built in          Prior Functioning/Environment Level of Independence: Independent with assistive device(s)        Comments: uses a SPC to get in and out of the house        OT Problem List: Decreased activity tolerance;Impaired balance (sitting and/or standing);Cardiopulmonary status limiting activity      OT Treatment/Interventions: Self-care/ADL training;DME and/or AE instruction;Energy conservation;Therapeutic exercise;Therapeutic  activities;Patient/family education;Balance training    OT Goals(Current goals can be found in the care plan section) Acute Rehab OT Goals Patient Stated Goal: to breathe better, go home OT Goal Formulation: With patient/family Time For Goal Achievement: 01/20/19 Potential to Achieve Goals: Good ADL Goals Pt Will Perform Grooming: with modified independence;standing Pt Will Perform Upper Body Dressing: with modified independence;sitting Pt Will Perform Lower Body Dressing: with supervision;sit to/from stand Pt Will Transfer to Toilet: with supervision;ambulating Pt Will Perform Toileting - Clothing Manipulation and hygiene: with modified independence;sit to/from stand Additional ADL Goal #1: Pt will recall 3 ways of conserving energy throughout ADL at independent level  OT Frequency: Min 2X/week   Barriers to D/C:            Co-evaluation              AM-PAC OT "6 Clicks" Daily Activity     Outcome Measure Help from another person eating meals?: None Help from another person taking care of personal grooming?: A Little Help from another person toileting, which includes using toliet, bedpan, or urinal?: A Little Help from another person bathing (including washing, rinsing, drying)?: A Little Help from another person to put on and taking off regular upper body clothing?: None Help  from another person to put on and taking off regular lower body clothing?: A Little 6 Click Score: 20   End of Session Equipment Utilized During Treatment: Gait belt;Oxygen(3L) Nurse Communication: Mobility status;Other (comment)(no chair alarm)  Activity Tolerance: Patient tolerated treatment well(fatigues quickly) Patient left: in chair;with call bell/phone within reach;with family/visitor present  OT Visit Diagnosis: Unsteadiness on feet (R26.81);Repeated falls (R29.6);History of falling (Z91.81);Muscle weakness (generalized) (M62.81)                Time: 1230-1306 OT Time Calculation (min):  36 min Charges:  OT General Charges $OT Visit: 1 Visit OT Evaluation $OT Eval Moderate Complexity: 1 Mod OT Treatments $Self Care/Home Management : 8-22 mins  Hulda Humphrey OTR/L Acute Rehabilitation Services Pager: [phone] Office: [place] 01/06/2019, 1:38 PM

## 2019-01-06 NOTE — Progress Notes (Signed)
Pharmacy Antibiotic Note  Gary Odonnell is a 85 y.o. male admitted on 01/05/2019 with pneumonia.  Pharmacy has been consulted for Cefepime dosing.  Plan: - Increase Cefepime 2gm qq12h - Azithromycin 500mg  IV q24 per MD  Height: 5' 11.5" (181.6 cm) Weight: 196 lb (88.9 kg) IBW/kg (Calculated) : 76.45  Temp (24hrs), Avg:97.6 F (36.4 C), Min:97.3 F (36.3 C), Max:97.9 F (36.6 C)  Recent Labs  Lab 01/05/19 1502 01/05/19 1620 01/05/19 1859 01/05/19 2315 01/06/19 0553  WBC 6.9  --   --   --  5.7  CREATININE 1.28*  --   --   --  1.16  LATICACIDVEN  --  1.0 2.0* 1.2  --     Estimated Creatinine Clearance: 51.3 mL/min (by C-G formula based on SCr of 1.16 mg/dL).    No Known Allergies  Antimicrobials this admission: 2/11 Levaquin x1 in ED 2/11 Cefepime >>  2/11 Azithromycin >>  Dose adjustments this admission:  Microbiology results: 2/11 BCx: ngtd 2/11 Influenza A/B: neg/neg Strep pneumo:neg Legionella: IP 2/11 SputumCx: acceptable 2/11 MRSA PCR: positive 2/12 UCx:  Thank you for allowing pharmacy to be a part of this patient's care.  Peggyann Juba, PharmD, BCPS Pager: [phone] 01/06/2019 7:03 AM

## 2019-01-06 NOTE — Progress Notes (Signed)
BSE completed, full report to follow.  Pt presents with complaint of sensation of foods lodging in left side of pharynx (pointing toward vallecular region).  He states this started after he fell over a dog/lawn mower and has not improved.  He states the left side of his face is numb since fall.   Pt did not pass [facility] 3 ounce water test as swallowing was followed immediately by a subtle weak cough.  He admits at times, liquids will go down the wrong way.  Pt denies esophageal issues = s/p XRT for lung cancer.  Will proceed with MBS given pt report of dysphagia which he reports his primary MD coorelated to his stroke.  Note per imaging study- pt with left frontal cortex cva hx.    Gary Odonnell, [facility] SLP Acute Rehab Services Pager [phone] Office [phone]

## 2019-01-06 NOTE — Progress Notes (Signed)
Inpatient Diabetes Program Recommendations  AACE/ADA: New Consensus Statement on Inpatient Glycemic Control (2015)  Target Ranges:  Prepandial:   less than 140 mg/dL      Peak postprandial:   less than 180 mg/dL (1-2 hours)      Critically ill patients:  140 - 180 mg/dL   Lab Results  Component Value Date   GLUCAP 257 (H) 01/06/2019   HGBA1C 6.5 (H) 06/04/2018    Review of Glycemic Control  Diabetes history: No prior hx listed (A1c 6.5 06/04/18)  Inpatient Diabetes Program Recommendations:   While in hospital and on steroids: -Glycemic control order set with sensitive correction tid  Thank you, Nani Gasser. Yuniel Blaney, RN, MSN, CDE  Diabetes Coordinator Inpatient Glycemic Control Team Team Pager [phone] (8am-5pm) 01/06/2019 2:51 PM

## 2019-01-06 NOTE — Progress Notes (Signed)
PROGRESS NOTE    [name]  ATF:[id number] DOB: 02/20/1932 DOA: 01/05/2019 PCP: Glenda Chroman, MD    Brief Narrative:  Gary Odonnell is a pleasant 85 y.o. male with medical history significant of recently diagnosed A. fib during ER visit on 12/28/2019 patient was diagnosed with a pneumonia, history of left lung cancer status post radiation, coronary artery disease, hyperlipidemia, hypertension, peripheral artery disease who presents to the ED with worsening shortness of breath and non-productive persistent cough.  Patient was seen in the ED on 12/27/2018 diagnosed with pneumonia and discharged on Augmentin.  Patient states he completed course of Augmentin however still with ongoing nonproductive cough and shortness of breath.  Hospitalist were called to admit the patient for further evaluation and management.   Assessment & Plan:   Principal Problem:   CAP (community acquired pneumonia) Active Problems:   HTN (hypertension)   CHF (congestive heart failure) (HCC)   CAD (coronary atherosclerotic disease)   S/P CABG x 4   Primary cancer of left lower lobe of lung (HCC)   PAD (peripheral artery disease) ([place])   COPD with acute exacerbation (HCC)   A-fib (HCC)   Tobacco abuse   Acute respiratory failure with hypoxia probably secondary to a combination of acute COPD exacerbation and persistent community-acquired pneumonia Patient continues to be short of breath with cough and productive.  He continues to require up to 3 L of nasal cannula oxygen.  Plan to continue IV antibiotics, IV Solu-Medrol, duo nebs. Follow blood cultures, get sputum cultures. Get urine for strep antigen and Legionella antigen.    Newly diagnosed paroxysmal atrial fibrillation Continue with Eliquis and resume Coreg. If patient continues to be in A. fib will further need DCCV. Outpatient follow-up with cardiology.  Chronic systolic and diastolic heart failure Patient appears to be compensated at this  time.  Stable coronary artery disease status post CABG Patient currently denies any chest pain shortness of breath. Continue home medications of Coreg and Eliquis.   History of tobacco abuse Tobacco cessation counseling and nicotine patch placed.   Hypertension Blood pressure well controlled this morning.  DVT prophylaxis: Eliquis Code Status: DNR Family Communication: None at bedside  disposition Plan: Pending clinical improvement  Consultants:  None Procedures: none.   Antimicrobials: IV cefepime, IV Zithromax from admission  Subjective: Patient continues to be short of breath, productive cough, tachypneic and requires about 3 L of nasal cannula oxygen.  Objective: Vitals:   01/06/19 0517 01/06/19 0943 01/06/19 1354 01/06/19 1432  BP: (!) 161/97  (!) 156/61   Pulse: 67 62 72 73  Resp: 17 17 18 17   Temp: (!) 97.3 F (36.3 C)  (!) 97.5 F (36.4 C)   TempSrc: Oral  Oral   SpO2: 100% 99% 96% 96%  Weight:      Height:        Intake/Output Summary (Last 24 hours) at 01/06/2019 1556 Last data filed at 01/06/2019 1547 Gross per 24 hour  Intake 3804.62 ml  Output 275 ml  Net 3529.62 ml   Filed Weights   01/05/19 1457  Weight: 88.9 kg    Examination:  General exam: in mod distress from sob and tachypnea, on 3 lit of [place] oxygen.  Respiratory system:  Bilateral exp wheezing .  Cardiovascular system: S1 & S2 heard, RRR. No JVD, murmurs,. No pedal edema. Gastrointestinal system: Abdomen is nondistended, soft and nontender. No organomegaly or masses felt. Normal bowel sounds heard. Central nervous system: Alert and oriented. No  focal neurological deficits. Extremities: Symmetric 5 x 5 power. Skin: No rashes, lesions or ulcers Psychiatry: . Mood & affect appropriate.     Data Reviewed: I have personally reviewed following labs and imaging studies  CBC: Recent Labs  Lab 01/05/19 1502 01/06/19 0553  WBC 6.9 5.7  NEUTROABS 5.3  --   HGB 14.1 12.3*  HCT 48.5  42.7  MCV 99.6 103.6*  PLT 178 740*   Basic Metabolic Panel: Recent Labs  Lab 01/05/19 1502 01/05/19 2011 01/06/19 0553  NA 144  --  141  K 4.4  --  4.6  CL 105  --  108  CO2 31  --  22  GLUCOSE 157*  --  261*  BUN 26*  --  25*  CREATININE 1.28*  --  1.16  CALCIUM 9.7  --  9.1  MG  --  2.2  --    GFR: Estimated Creatinine Clearance: 51.3 mL/min (by C-G formula based on SCr of 1.16 mg/dL). Liver Function Tests: Recent Labs  Lab 01/05/19 1502  AST 21  ALT 46*  ALKPHOS 64  BILITOT 1.9*  PROT 7.1  ALBUMIN 4.0   No results for input(s): LIPASE, AMYLASE in the last 168 hours. No results for input(s): AMMONIA in the last 168 hours. Coagulation Profile: No results for input(s): INR, PROTIME in the last 168 hours. Cardiac Enzymes: No results for input(s): CKTOTAL, CKMB, CKMBINDEX, TROPONINI in the last 168 hours. BNP (last 3 results) No results for input(s): PROBNP in the last 8760 hours. HbA1C: No results for input(s): HGBA1C in the last 72 hours. CBG: Recent Labs  Lab 01/06/19 0726  GLUCAP 257*   Lipid Profile: No results for input(s): CHOL, HDL, LDLCALC, TRIG, CHOLHDL, LDLDIRECT in the last 72 hours. Thyroid Function Tests: No results for input(s): TSH, T4TOTAL, FREET4, T3FREE, THYROIDAB in the last 72 hours. Anemia Panel: No results for input(s): VITAMINB12, FOLATE, FERRITIN, TIBC, IRON, RETICCTPCT in the last 72 hours. Sepsis Labs: Recent Labs  Lab 01/05/19 1620 01/05/19 1859 01/05/19 2315  LATICACIDVEN 1.0 2.0* 1.2    Recent Results (from the past 240 hour(s))  Blood Culture (routine x 2)     Status: None (Preliminary result)   Collection Time: 01/05/19  2:47 PM  Result Value Ref Range Status   Specimen Description   Final    BLOOD LEFT ANTECUBITAL Performed at [facility], [place] [address] [place], [place] [address]    Special Requests   Final    Blood Culture adequate volume BOTTLES DRAWN AEROBIC AND ANAEROBIC   Culture    Final    NO GROWTH < 12 HOURS Performed at [facility], [place] [address], [place], [place] [address]    Report Status PENDING  Incomplete  Blood Culture (routine x 2)     Status: None (Preliminary result)   Collection Time: 01/05/19  3:07 PM  Result Value Ref Range Status   Specimen Description   Final    BLOOD RIGHT ANTECUBITAL Performed at [place] [address], [place], [place] [address]    Special Requests   Final    BOTTLES DRAWN AEROBIC AND ANAEROBIC Blood Culture adequate volume Performed at [place] [facility] [address] [place], [place] [address]    Culture   Final    NO GROWTH < 12 HOURS Performed at [facility] [address], [place], [place] [address]    Report Status PENDING  Incomplete  Culture, sputum-assessment  Status: None   Collection Time: 01/05/19  5:31 PM  Result Value Ref Range Status   Specimen Description EXPECTORATED SPUTUM  Final   Special Requests NONE  Final   Sputum evaluation   Final    THIS SPECIMEN IS ACCEPTABLE FOR SPUTUM CULTURE Performed at [facility], [place] [address] [place], [place] [address]    Report Status 01/06/2019 FINAL  Final  Culture, respiratory     Status: None (Preliminary result)   Collection Time: 01/05/19  5:31 PM  Result Value Ref Range Status   Specimen Description   Final    EXPECTORATED SPUTUM Performed at [place] [address] [place], [place] [address]    Special Requests   Final    NONE Reflexed from [id number] Performed at [facility], [place] [address] [place], [place] [address]    Gram Stain   Final    NO WBC SEEN RARE SQUAMOUS EPITHELIAL CELLS PRESENT FEW GRAM POSITIVE COCCI IN CLUSTERS FEW GRAM POSITIVE RODS RARE GRAM NEGATIVE RODS Performed at [facility], [place] [address] [place], [place] [address]    Culture PENDING  Incomplete   Report Status PENDING  Incomplete  MRSA PCR  Screening     Status: Abnormal   Collection Time: 01/05/19  6:52 PM  Result Value Ref Range Status   MRSA by PCR POSITIVE (A) NEGATIVE Final    Comment:        The GeneXpert MRSA Assay (FDA approved for NASAL specimens only), is one component of a comprehensive MRSA colonization surveillance program. It is not intended to diagnose MRSA infection nor to guide or monitor treatment for MRSA infections. RESULT CALLED TO, READ BACK BY AND VERIFIED WITH: C.ADDISON AT 2145 ON 01/05/19 BY N.THOMPSON Performed at [facility], [place] [address] [place], [place] [address]          Radiology Studies: Dg Chest 2 View  Result Date: 01/05/2019 CLINICAL DATA:  85 year old with acute cough and shortness of breath. Current smoker. EXAM: CHEST - 2 VIEW COMPARISON:  12/27/2018 and earlier, including CT chest 07/04/2015. FINDINGS: Prior sternotomy. Cardiac silhouette moderately enlarged, unchanged. Thoracic aorta atherosclerotic, unchanged. Hilar and mediastinal contours otherwise unremarkable. Airspace consolidation in the BILATERAL lower lobes, LEFT greater than RIGHT, increased since the chest x-ray 9 days ago. Lungs remain clear otherwise. Normal pulmonary vascularity. Stable BILATERAL pleural effusions. Remote T12 compression fracture. IMPRESSION: Worsening bilateral lower lobe pneumonia, LEFT greater than RIGHT. Stable BILATERAL pleural effusions. Electronically Signed   By: Evangeline Dakin M.D.   On: 01/05/2019 14:33   Dg Swallowing Func-speech Pathology  Result Date: 01/06/2019 Objective Swallowing Evaluation: Type of Study: MBS-Modified Barium Swallow Study  Patient Details Name: VERONICA GUERRANT MRN: [id number] Date of Birth: 1932/09/07 Today's Date: 01/06/2019 Time: SLP Start Time (ACUTE ONLY): 1225 -SLP Stop Time (ACUTE ONLY): 1255 SLP Time Calculation (min) (ACUTE ONLY): 30 min Past Medical History: Past Medical History: Diagnosis Date . Cancer ([place])   L lung . Coronary artery  disease  . Hypercholesteremia  . Hypertension  . PAD (peripheral artery disease) ([place])  . Pneumonia 09/2013 . Radiation april 2016 . Solitary pulmonary nodule on lung CT 12/13/13   11/15/14 . Stroke [facility]) 11/2008 Past Surgical History: Past Surgical History: Procedure Laterality Date . Angioplasty to Left femoral artery   . CARPAL TUNNEL RELEASE Left 05/11/2014  Procedure: LEFT CARPAL TUNNEL RELEASE;  Surgeon: Carole Civil, MD;  Location: [facility] ORS;  Service: Orthopedics;  Laterality: Left; . COLONOSCOPY N/A 01/24/2015  Procedure: COLONOSCOPY;  Surgeon: Danie Binder, MD;  Location: [facility] ENDO SUITE;  Service: Endoscopy;  Laterality: N/A;  130 . CORONARY ARTERY BYPASS GRAFT    x4 . FEMORAL-POPLITEAL BYPASS GRAFT Right 10/23/2015  Procedure: BYPASS GRAFT FEMORAL TO BELOW KNEE POPLITEAL ARTERY USING RIGHT NON-REVERSED GREATER SAPPHENOUS VEIN;  Surgeon: [name], MD;  Location: [place];  Service: Vascular;  Laterality: Right; . INTRAOPERATIVE ARTERIOGRAM Right 10/23/2015  Procedure: INTRA OPERATIVE ARTERIOGRAM - RIGHT LOWER LEG;  Surgeon: [name], MD;  Location: [place];  Service: Vascular;  Laterality: Right; . KNEE ARTHROSCOPY Right  . LEFT AND RIGHT HEART CATHETERIZATION WITH CORONARY ANGIOGRAM N/A 09/29/2013  Procedure: LEFT AND RIGHT HEART CATHETERIZATION WITH CORONARY ANGIOGRAM;  Surgeon: Burnell Blanks, MD;  Location: [facility] CATH LAB;  Service: Cardiovascular;  Laterality: N/A; . PERIPHERAL VASCULAR CATHETERIZATION N/A 04/21/2015  Procedure: Abdominal Aortogram w/Lower Extremity;  Surgeon: [name], MD;  Location: [place] CV LAB;  Service: Cardiovascular;  Laterality: N/A; . Stent to right femoral artery   . TRIGGER FINGER RELEASE Bilateral  . VEIN HARVEST Right 10/23/2015  Procedure: [place];  Surgeon: [name], MD;  Location: [place];  Service: Vascular;  Laterality: Right; HPI: 85 yo male adm to [facility] with CAP.  Pt with PMH + for left frontal lobe  infarct 2014 after having left hand and facial numbness, CVA, lung cancer s/p XRT.  Swallow evaluation ordered. Pt CXR showed worsening bilateral lobe pna (left more than right).  He is a smoker.   Subjective: pt awake in chair Assessment / Plan / Recommendation CHL IP CLINICAL IMPRESSIONS 01/06/2019 Clinical Impression Patient presents with overall functional oropharyngeal swallow ability.  His ability to masticate is mildly compromised given his lack of upper dentures.  Pharyngeal swallow is strong without significant residuals.  Laryngeal penetration of thin liquid observed that is [facility] given pt's age.  Pt even tested with sequential swallows of liquids with good airway protection.  Note pt conducting extended breath hold when swallowing liquids to help protect airway with residual clearance.  Of note, barium tablet momentarily lodged at pyriform sinus which pt immediately cleared with 2nd swallow of liquid.  He was NOT sensate to pill lodging.  Pt's symptoms were not demonstrated during MBS.   SLP Visit Diagnosis Dysphagia, unspecified (R13.10) Attention and concentration deficit following -- Frontal lobe and executive function deficit following -- Impact on safety and function Mild aspiration risk   CHL IP TREATMENT RECOMMENDATION 01/06/2019 Treatment Recommendations Defer until completion of intrumental exam   Prognosis 01/06/2019 Prognosis for Safe Diet Advancement Guarded Barriers to Reach Goals Severity of deficits;Time post onset Barriers/Prognosis Comment -- CHL IP DIET RECOMMENDATION 01/06/2019 SLP Diet Recommendations Thin liquid;Regular solids Liquid Administration via Cup Medication Administration Whole meds with liquid Compensations Slow rate;Small sips/bites Postural Changes Remain semi-upright after after feeds/meals (Comment);Seated upright at 90 degrees   CHL IP OTHER RECOMMENDATIONS 01/06/2019 Recommended Consults -- Oral Care Recommendations Oral care BID Other Recommendations --   CHL IP FOLLOW UP  RECOMMENDATIONS 01/06/2019 Follow up Recommendations None   No flowsheet data found.     CHL IP ORAL PHASE 01/06/2019 Oral Phase WFL Oral - Pudding Teaspoon -- Oral - Pudding Cup -- Oral - Honey Teaspoon -- Oral - Honey Cup -- Oral - Nectar Teaspoon -- Oral - Nectar Cup WFL Oral - Nectar Straw -- Oral - Thin Teaspoon -- Oral - Thin Cup [facility]  Oral - Thin Straw WFL Oral - Puree WFL Oral - Mech Soft WFL Oral - Regular -- Oral - Multi-Consistency WFL Oral - Pill WFL Oral Phase - Comment --  CHL IP PHARYNGEAL PHASE 01/06/2019 Pharyngeal Phase Impaired Pharyngeal- Pudding Teaspoon -- Pharyngeal -- Pharyngeal- Pudding Cup -- Pharyngeal -- Pharyngeal- Honey Teaspoon -- Pharyngeal -- Pharyngeal- Honey Cup -- Pharyngeal -- Pharyngeal- Nectar Teaspoon -- Pharyngeal -- Pharyngeal- Nectar Cup WFL Pharyngeal -- Pharyngeal- Nectar Straw -- Pharyngeal -- Pharyngeal- Thin Teaspoon -- Pharyngeal -- Pharyngeal- Thin Cup WFL;Penetration/Aspiration during swallow Pharyngeal Material enters airway, remains ABOVE vocal cords and not ejected out Pharyngeal- Thin Straw WFL;Penetration/Aspiration during swallow Pharyngeal Material enters airway, remains ABOVE vocal cords and not ejected out Pharyngeal- Puree WFL Pharyngeal -- Pharyngeal- Mechanical Soft WFL Pharyngeal -- Pharyngeal- Regular -- Pharyngeal -- Pharyngeal- Multi-consistency WFL Pharyngeal -- Pharyngeal- Pill Pharyngeal residue - pyriform Pharyngeal -- Pharyngeal Comment pill lodged at pyriform sinus momentarily without pt awareness- liquid swallow facililtated clearance; pt does an extended breath hold with double swallows when consuming liquids - suspect self created compensation strategy  CHL IP CERVICAL ESOPHAGEAL PHASE 01/06/2019 Cervical Esophageal Phase WFL Pudding Teaspoon -- Pudding Cup -- Honey Teaspoon -- Honey Cup -- Nectar Teaspoon -- Nectar Cup -- Nectar Straw -- Thin Teaspoon -- Thin Cup -- Thin Straw -- Puree -- Mechanical Soft -- Regular -- Multi-consistency -- Pill --  Cervical Esophageal Comment -- Macario Golds 01/06/2019, 12:13 PM  Luanna Salk, MS [facility] SLP Acute Rehab Services Pager [phone] Office [phone]                  Scheduled Meds: . apixaban  5 mg Oral BID  . budesonide (PULMICORT) nebulizer solution  0.5 mg Nebulization BID  . carvedilol  12.5 mg Oral BID WC  . Chlorhexidine Gluconate Cloth  6 each Topical Q0600  . ezetimibe  10 mg Oral Daily  . fluticasone  2 spray Each Nare Daily  . guaiFENesin  1,200 mg Oral BID  . ipratropium  0.5 mg Nebulization Q6H  . levalbuterol  0.63 mg Nebulization Q6H  . loratadine  10 mg Oral Daily  . methylPREDNISolone (SOLU-MEDROL) injection  60 mg Intravenous Q12H  . mupirocin ointment  1 application Nasal BID  . nicotine  14 mg Transdermal Daily  . pantoprazole  40 mg Oral Q0600  . potassium chloride  10 mEq Oral Daily  . sacubitril-valsartan  1 tablet Oral BID  . sodium chloride flush  3 mL Intravenous Q12H   Continuous Infusions: . sodium chloride 75 mL/hr at 01/06/19 1502  . azithromycin 500 mg (01/06/19 1542)  . ceFEPime (MAXIPIME) IV Stopped (01/06/19 1054)     LOS: 1 day    Time spent: 35 minutes.     Hosie Poisson, MD [facility] Pager [phone]   If 7PM-7AM, please contact night-coverage [email or web address] Password [facility] 01/06/2019, 3:56 PM

## 2019-01-06 NOTE — Evaluation (Signed)
Clinical/Bedside Swallow Evaluation Patient Details  Name: Gary Odonnell MRN: [id number] Date of Birth: 1932/04/10  Today's Date: 01/06/2019 Time: SLP Start Time (ACUTE ONLY): 0735 SLP Stop Time (ACUTE ONLY): 0752 SLP Time Calculation (min) (ACUTE ONLY): 17 min  Past Medical History:  Past Medical History:  Diagnosis Date  . Cancer ([place])    L lung  . Coronary artery disease   . Hypercholesteremia   . Hypertension   . PAD (peripheral artery disease) ([place])   . Pneumonia 09/2013  . Radiation april 2016  . Solitary pulmonary nodule on lung CT 12/13/13   11/15/14  . Stroke [facility]) 11/2008   Past Surgical History:  Past Surgical History:  Procedure Laterality Date  . Angioplasty to Left femoral artery    . CARPAL TUNNEL RELEASE Left 05/11/2014   Procedure: LEFT CARPAL TUNNEL RELEASE;  Surgeon: Carole Civil, MD;  Location: [facility] ORS;  Service: Orthopedics;  Laterality: Left;  . COLONOSCOPY N/A 01/24/2015   Procedure: COLONOSCOPY;  Surgeon: Danie Binder, MD;  Location: [facility] ENDO [facility];  Service: Endoscopy;  Laterality: N/A;  130  . CORONARY ARTERY BYPASS GRAFT     x4  . FEMORAL-POPLITEAL BYPASS GRAFT Right 10/23/2015   Procedure: BYPASS GRAFT FEMORAL TO BELOW KNEE POPLITEAL ARTERY USING RIGHT NON-REVERSED GREATER SAPPHENOUS VEIN;  Surgeon: [name], MD;  Location: [place];  Service: Vascular;  Laterality: Right;  . INTRAOPERATIVE ARTERIOGRAM Right 10/23/2015   Procedure: INTRA OPERATIVE ARTERIOGRAM - RIGHT LOWER LEG;  Surgeon: [name], MD;  Location: [place];  Service: Vascular;  Laterality: Right;  . KNEE ARTHROSCOPY Right   . LEFT AND RIGHT HEART CATHETERIZATION WITH CORONARY ANGIOGRAM N/A 09/29/2013   Procedure: LEFT AND RIGHT HEART CATHETERIZATION WITH CORONARY ANGIOGRAM;  Surgeon: Burnell Blanks, MD;  Location: [facility] CATH LAB;  Service: Cardiovascular;  Laterality: N/A;  . PERIPHERAL VASCULAR CATHETERIZATION N/A 04/21/2015   Procedure: Abdominal Aortogram w/Lower  Extremity;  Surgeon: [name], MD;  Location: [place] CV LAB;  Service: Cardiovascular;  Laterality: N/A;  . Stent to right femoral artery    . TRIGGER FINGER RELEASE Bilateral   . VEIN HARVEST Right 10/23/2015   Procedure: [place];  Surgeon: [name], MD;  Location: [place];  Service: Vascular;  Laterality: Right;   HPI:  85 yo male adm to [facility] with CAP.  Pt with PMH + for left frontal lobe infarct 2014 after having left hand and facial numbness, CVA, lung cancer s/p XRT.  Swallow evaluation ordered. Pt CXR showed worsening bilateral lobe pna (left more than right).  He is a smoker.     Assessment / Plan / Recommendation Clinical Impression  Pt presents with complaint of sensation of foods lodging in left side of pharynx (pointing toward vallecular region).  He states this started after he fell over a dog/lawn mower and has not improved.  He states the left side of his face is numb since fall.   Pt did not pass [facility] 3 ounce water test as swallowing was followed immediately by a subtle weak cough.  He admits at times, liquids will go down the wrong way.  Pt denies esophageal issues = s/p XRT for lung cancer.  Will proceed with MBS given pt report of dysphagia which he reports his primary MD coorelated to his stroke.  SLP Visit Diagnosis: Dysphagia, unspecified (R13.10)    Aspiration Risk  Mild aspiration risk    Diet Recommendation Other (Comment);Regular;Thin liquid(pt  eats softer foods)   Medication Administration: Whole meds with liquid Supervision: Patient able to self feed Compensations: Minimize environmental distractions;Slow rate;Small sips/bites Postural Changes: Seated upright at 90 degrees;Remain upright for at least 30 minutes after po intake    Other  Recommendations   tbd  Follow up Recommendations   MBS     Frequency and Duration   tbd         Prognosis    tbd    Swallow Study   General Date of Onset:  01/06/19 HPI: 85 yo male adm to [facility] with CAP.  Pt with PMH + for left frontal lobe infarct 2014 after having left hand and facial numbness, CVA, lung cancer s/p XRT.  Swallow evaluation ordered. Pt CXR showed worsening bilateral lobe pna (left more than right).  He is a smoker.   Type of Study: Bedside Swallow Evaluation Diet Prior to this Study: Regular;Thin liquids Temperature Spikes Noted: No Respiratory Status: Nasal cannula History of Recent Intubation: No Behavior/Cognition: Alert Oral Cavity Assessment: Within Functional Limits Oral Care Completed by SLP: No Oral Cavity - Dentition: Other (Comment);Missing dentition(no upper denture, few lowers) Vision: Functional for self-feeding Self-Feeding Abilities: Able to feed self Patient Positioning: Upright in bed Baseline Vocal Quality: Hoarse;Other (comment) Volitional Cough: Weak Volitional Swallow: Able to elicit    Oral/Motor/Sensory Function Overall Oral Motor/Sensory Function: Within functional limits(x pt report of left facial numbness)   Ice Chips Ice chips: Not tested   Thin Liquid Thin Liquid: Impaired Presentation: Straw;Spoon;Cup Other Comments: subtle cough post swallow 3 ounces    Nectar Thick Nectar Thick Liquid: Not tested   Honey Thick Honey Thick Liquid: Not tested   Puree Puree: Within functional limits Presentation: Self Fed;Spoon   Solid     Solid: Impaired Presentation: Self Fed Oral Phase Impairments: Impaired mastication Other Comments: slow mastication with minimal posterior oral cavity residual reported by pt      Luanna Salk, MS [facility] SLP Acute Rehab Services Pager [phone] Office [phone]  Macario Golds 01/06/2019,9:45 AM

## 2019-01-06 NOTE — Progress Notes (Signed)
Modified Barium Swallow Progress Note  Patient Details  Name: Gary Odonnell MRN: [id number] Date of Birth: 11/01/1932  Today's Date: 01/06/2019  Modified Barium Swallow completed.  Full report located under Chart Review in the Imaging Section.  Brief recommendations include the following:  Clinical Impression  Patient presents with overall functional oropharyngeal swallow ability.  His ability to masticate is mildly compromised given his lack of upper dentures.  Pharyngeal swallow is strong without significant residuals.  Laryngeal penetration of thin liquid observed that is [facility] given pt's age.  Pt even tested with sequential swallows of liquids with good airway protection.  Note pt conducting extended breath hold when swallowing liquids to help protect airway with residual clearance.  Of note, barium tablet momentarily lodged at pyriform sinus which pt immediately cleared with 2nd swallow of liquid.  He was NOT sensate to pill lodging.  Pt's symptoms were not demonstrated during MBS.     Swallow Evaluation Recommendations       SLP Diet Recommendations: Thin liquid;Regular solids(to allow pt to order foods he can manage)   Liquid Administration via: Cup   Medication Administration: Whole meds with liquid(take with puree if problematic)   Supervision: Patient able to self feed   Compensations: Slow rate;Small sips/bites(extended breath hold helpful to protect airway with liquids, pt initiated)   Postural Changes: Remain semi-upright after after feeds/meals (Comment);Seated upright at 90 degrees   Oral Care Recommendations: Oral care BID       Luanna Salk, MS [facility] SLP Acute Rehab Services Pager [phone] Office [phone]  Macario Golds 01/06/2019,12:12 PM

## 2019-01-07 LAB — LEGIONELLA PNEUMOPHILA SEROGP 1 UR AG: L. pneumophila Serogp 1 Ur Ag: NEGATIVE

## 2019-01-07 LAB — HIV ANTIBODY (ROUTINE TESTING W REFLEX): HIV SCREEN 4TH GENERATION: NONREACTIVE

## 2019-01-07 LAB — BASIC METABOLIC PANEL
ANION GAP: 7 (ref 5–15)
BUN: 28 mg/dL — ABNORMAL HIGH (ref 8–23)
CO2: 25 mmol/L (ref 22–32)
Calcium: 9.4 mg/dL (ref 8.9–10.3)
Chloride: 108 mmol/L (ref 98–111)
Creatinine, Ser: 1.26 mg/dL — ABNORMAL HIGH (ref 0.61–1.24)
GFR calc Af Amer: >60 mL/min (ref >60)
GFR calc non Af Amer: 52 mL/min — ABNORMAL LOW (ref >60)
Glucose, Bld: 232 mg/dL — ABNORMAL HIGH (ref 70–99)
POTASSIUM: 4.4 mmol/L (ref 3.5–5.1)
Sodium: 140 mmol/L (ref 135–145)

## 2019-01-07 LAB — URINE CULTURE: Culture: NO GROWTH

## 2019-01-07 LAB — GLUCOSE, CAPILLARY: Glucose-Capillary: 224 mg/dL — ABNORMAL HIGH (ref 70–99)

## 2019-01-07 MED ORDER — CARVEDILOL 6.25 MG PO TABS
6.2500 mg | ORAL_TABLET | Freq: Two times a day (BID) | ORAL | Status: DC
  Administered 2019-01-08: 6.25 mg via ORAL
  Filled 2019-01-07: qty 1

## 2019-01-07 MED ORDER — METHYLPREDNISOLONE SODIUM SUCC 40 MG IJ SOLR
40.0000 mg | Freq: Two times a day (BID) | INTRAMUSCULAR | Status: DC
  Administered 2019-01-07 – 2019-01-08 (×2): 40 mg via INTRAVENOUS
  Filled 2019-01-07 (×2): qty 1

## 2019-01-07 MED ORDER — LEVALBUTEROL HCL 0.63 MG/3ML IN NEBU
0.6300 mg | INHALATION_SOLUTION | RESPIRATORY_TRACT | Status: DC | PRN
  Administered 2019-01-07: 0.63 mg via RESPIRATORY_TRACT
  Filled 2019-01-07: qty 3

## 2019-01-07 NOTE — Progress Notes (Signed)
PROGRESS NOTE    [name]  OZD:[id number] DOB: [birth year] DOA: 01/05/2019 PCP: Glenda Chroman, MD    Brief Narrative:  Gary Odonnell is a pleasant 85 y.o. male with medical history significant of recently diagnosed A. fib during ER visit on 12/28/2019 patient was diagnosed with a pneumonia, history of left lung cancer status post radiation, coronary artery disease, hyperlipidemia, hypertension, peripheral artery disease who presents to the ED with worsening shortness of breath and non-productive persistent cough.  Patient was seen in the ED on 12/27/2018 diagnosed with pneumonia and discharged on Augmentin.  Patient states he completed course of Augmentin however still with ongoing nonproductive cough and shortness of breath.  Hospitalist were called to admit the patient for further evaluation and management.   Assessment & Plan:   Principal Problem:   CAP (community acquired pneumonia) Active Problems:   HTN (hypertension)   CHF (congestive heart failure) (HCC)   CAD (coronary atherosclerotic disease)   S/P CABG x 4   Primary cancer of left lower lobe of lung (HCC)   PAD (peripheral artery disease) ([place])   COPD with acute exacerbation (HCC)   A-fib (HCC)   Tobacco abuse   Acute respiratory failure with hypoxia probably secondary to a combination of acute COPD exacerbation and persistent community-acquired pneumonia Patient's breathing and cough has improved but he continues to require up to 2 L of nasal cannula oxygen.  Recommend continuing IV antibiotics for another 24 hours and start tapering steroids.  Continue with duo nebs.  Blood cultures so far negative.  Sputum cultures pending for strep pneumonia is negative. Influenza PCR is negative HIV PCR is negative Plan to wean him off the oxygen in the next 24 hours. Lactic acid level within normal limits    Newly diagnosed paroxysmal atrial fibrillation Continue with Eliquis and resume Coreg. If patient continues to be in A.  fib will further need DCCV. Outpatient follow-up with cardiology.  Chronic systolic and diastolic heart failure Patient appears to be compensated at this time.   coronary artery disease status post CABG Patient currently denies any chest pain shortness of breath. Continue home medications of Coreg and Eliquis.   History of tobacco abuse Tobacco cessation counseling and nicotine patch placed.   Hypertension Blood pressure well controlled this morning.  DVT prophylaxis: Eliquis Code Status: DNR Family Communication: None at bedside  disposition Plan: Pending clinical improvement  Consultants:  None Procedures: none.   Antimicrobials: IV cefepime, IV Zithromax from admission  Subjective: Patient on 2 L of nasal cannula oxygen.  Shortness of breath and cough has improved but not resolved..  Objective: Vitals:   01/07/19 0404 01/07/19 0500 01/07/19 0606 01/07/19 0738  BP:   140/84   Pulse:   75   Resp:   18   Temp:   97.7 F (36.5 C)   TempSrc:   Oral   SpO2: 96%  98% 97%  Weight:  93.4 kg    Height:        Intake/Output Summary (Last 24 hours) at 01/07/2019 1208 Last data filed at 01/07/2019 0600 Gross per 24 hour  Intake 1745.46 ml  Output 100 ml  Net 1645.46 ml   Filed Weights   01/05/19 1457 01/07/19 0500  Weight: 88.9 kg 93.4 kg    Examination:  General exam: Mild distress from wheezing, 2 L of nasal cannula oxygen Respiratory system:  Bilateral exp wheezing posteriorly, air entry fair Cardiovascular system: S1 & S2 heard, RRR. No JVD, murmurs,. No  pedal edema. Gastrointestinal system: Abdomen is soft, nontender, nondistended Central nervous system: Alert and oriented. No focal neurological deficits. Extremities: Symmetric 5 x 5 power. Skin: No rashes, lesions or ulcers Psychiatry: . Mood & affect appropriate.     Data Reviewed: I have personally reviewed following labs and imaging studies  CBC: Recent Labs  Lab 01/05/19 1502 01/06/19 0553    WBC 6.9 5.7  NEUTROABS 5.3  --   HGB 14.1 12.3*  HCT 48.5 42.7  MCV 99.6 103.6*  PLT 178 509*   Basic Metabolic Panel: Recent Labs  Lab 01/05/19 1502 01/05/19 2011 01/06/19 0553 01/07/19 0859  NA 144  --  141 140  K 4.4  --  4.6 4.4  CL 105  --  108 108  CO2 31  --  22 25  GLUCOSE 157*  --  261* 232*  BUN 26*  --  25* 28*  CREATININE 1.28*  --  1.16 1.26*  CALCIUM 9.7  --  9.1 9.4  MG  --  2.2  --   --    GFR: Estimated Creatinine Clearance: 51.4 mL/min (A) (by C-G formula based on SCr of 1.26 mg/dL (H)). Liver Function Tests: Recent Labs  Lab 01/05/19 1502  AST 21  ALT 46*  ALKPHOS 64  BILITOT 1.9*  PROT 7.1  ALBUMIN 4.0   No results for input(s): LIPASE, AMYLASE in the last 168 hours. No results for input(s): AMMONIA in the last 168 hours. Coagulation Profile: No results for input(s): INR, PROTIME in the last 168 hours. Cardiac Enzymes: No results for input(s): CKTOTAL, CKMB, CKMBINDEX, TROPONINI in the last 168 hours. BNP (last 3 results) No results for input(s): PROBNP in the last 8760 hours. HbA1C: No results for input(s): HGBA1C in the last 72 hours. CBG: Recent Labs  Lab 01/06/19 0726 01/07/19 0804  GLUCAP 257* 224*   Lipid Profile: No results for input(s): CHOL, HDL, LDLCALC, TRIG, CHOLHDL, LDLDIRECT in the last 72 hours. Thyroid Function Tests: No results for input(s): TSH, T4TOTAL, FREET4, T3FREE, THYROIDAB in the last 72 hours. Anemia Panel: No results for input(s): VITAMINB12, FOLATE, FERRITIN, TIBC, IRON, RETICCTPCT in the last 72 hours. Sepsis Labs: Recent Labs  Lab 01/05/19 1620 01/05/19 1859 01/05/19 2315  LATICACIDVEN 1.0 2.0* 1.2    Recent Results (from the past 240 hour(s))  Blood Culture (routine x 2)     Status: None (Preliminary result)   Collection Time: 01/05/19  2:47 PM  Result Value Ref Range Status   Specimen Description   Final    BLOOD LEFT ANTECUBITAL Performed at [facility], [place]  [address], [place], [place] [address]    Special Requests   Final    Blood Culture adequate volume BOTTLES DRAWN AEROBIC AND ANAEROBIC   Culture   Final    NO GROWTH 2 DAYS Performed at [facility], [place] [address] [place], [place] [address]    Report Status PENDING  Incomplete  Blood Culture (routine x 2)     Status: None (Preliminary result)   Collection Time: 01/05/19  3:07 PM  Result Value Ref Range Status   Specimen Description   Final    BLOOD RIGHT ANTECUBITAL Performed at [place] [address], [place], [place] [address]    Special Requests   Final    BOTTLES DRAWN AEROBIC AND ANAEROBIC Blood Culture adequate volume Performed at [place] [address], [place],  [address]    Culture  Final    NO GROWTH 2 DAYS Performed at [facility], [place] [address], [place], [place] [address]    Report Status PENDING  Incomplete  Culture, sputum-assessment     Status: None   Collection Time: 01/05/19  5:31 PM  Result Value Ref Range Status   Specimen Description EXPECTORATED SPUTUM  Final   Special Requests NONE  Final   Sputum evaluation   Final    THIS SPECIMEN IS ACCEPTABLE FOR SPUTUM CULTURE Performed at [facility], [place] [address], [place], [place] [address]    Report Status 01/06/2019 FINAL  Final  Culture, respiratory     Status: None (Preliminary result)   Collection Time: 01/05/19  5:31 PM  Result Value Ref Range Status   Specimen Description   Final    EXPECTORATED SPUTUM Performed at [place] [address], [place], [place] [address]    Special Requests   Final    NONE Reflexed from [id number] Performed at [facility], [place] [address], [place], [place] [address]    Gram Stain   Final    NO WBC SEEN RARE SQUAMOUS EPITHELIAL CELLS PRESENT FEW GRAM POSITIVE COCCI IN CLUSTERS FEW GRAM POSITIVE RODS RARE GRAM NEGATIVE RODS    Culture    Final    CULTURE REINCUBATED FOR BETTER GROWTH Performed at [facility], [place] [address] [place], [place] [address]    Report Status PENDING  Incomplete  MRSA PCR Screening     Status: Abnormal   Collection Time: 01/05/19  6:52 PM  Result Value Ref Range Status   MRSA by PCR POSITIVE (A) NEGATIVE Final    Comment:        The GeneXpert MRSA Assay (FDA approved for NASAL specimens only), is one component of a comprehensive MRSA colonization surveillance program. It is not intended to diagnose MRSA infection nor to guide or monitor treatment for MRSA infections. RESULT CALLED TO, READ BACK BY AND VERIFIED WITH: C.ADDISON AT 2145 ON 01/05/19 BY N.THOMPSON Performed at [facility], [place] [address], [place], [place] [address]   Culture, Urine     Status: None   Collection Time: 01/06/19 12:30 AM  Result Value Ref Range Status   Specimen Description   Final    URINE, CLEAN CATCH Performed at [facility], [place] [address], [place], [place] [address]    Special Requests   Final    NONE Performed at [facility], [place] [address] [place], [place] [address]    Culture   Final    NO GROWTH Performed at [facility], [place] [address], [place], [place] [address]    Report Status 01/07/2019 FINAL  Final         Radiology Studies: Dg Chest 2 View  Result Date: 01/05/2019 CLINICAL DATA:  85 year old with acute cough and shortness of breath. Current smoker. EXAM: CHEST - 2 VIEW COMPARISON:  12/27/2018 and earlier, including CT chest 07/04/2015. FINDINGS: Prior sternotomy. Cardiac silhouette moderately enlarged, unchanged. Thoracic aorta atherosclerotic, unchanged. Hilar and mediastinal contours otherwise unremarkable. Airspace consolidation in the BILATERAL lower lobes, LEFT greater than RIGHT, increased since the chest x-ray 9 days ago. Lungs remain clear otherwise. Normal pulmonary vascularity. Stable BILATERAL pleural  effusions. Remote T12 compression fracture. IMPRESSION: Worsening bilateral lower lobe pneumonia, LEFT greater than RIGHT. Stable BILATERAL pleural effusions. Electronically Signed   By: Evangeline Dakin M.D.   On: 01/05/2019 14:33  Dg Swallowing Func-speech Pathology  Result Date: 01/06/2019 Objective Swallowing Evaluation: Type of Study: MBS-Modified Barium Swallow Study  Patient Details Name: NIDAL RIVET MRN: [id number] Date of Birth: 09-03-1932 Today's Date: 01/06/2019 Time: SLP Start Time (ACUTE ONLY): 1225 -SLP Stop Time (ACUTE ONLY): 1255 SLP Time Calculation (min) (ACUTE ONLY): 30 min Past Medical History: Past Medical History: Diagnosis Date . Cancer ([place])   L lung . Coronary artery disease  . Hypercholesteremia  . Hypertension  . PAD (peripheral artery disease) ([place])  . Pneumonia 09/2013 . Radiation april 2016 . Solitary pulmonary nodule on lung CT 12/13/13   11/15/14 . Stroke [facility]) 11/2008 Past Surgical History: Past Surgical History: Procedure Laterality Date . Angioplasty to Left femoral artery   . CARPAL TUNNEL RELEASE Left 05/11/2014  Procedure: LEFT CARPAL TUNNEL RELEASE;  Surgeon: Carole Civil, MD;  Location: [facility];  Service: Orthopedics;  Laterality: Left; . COLONOSCOPY N/A 01/24/2015  Procedure: COLONOSCOPY;  Surgeon: Danie Binder, MD;  Location: [facility];  Service: Endoscopy;  Laterality: N/A;  130 . CORONARY ARTERY BYPASS GRAFT    x4 . FEMORAL-POPLITEAL BYPASS GRAFT Right 10/23/2015  Procedure: BYPASS GRAFT FEMORAL TO BELOW KNEE POPLITEAL ARTERY USING RIGHT NON-REVERSED GREATER SAPPHENOUS VEIN;  Surgeon: [name], MD;  Location: [place];  Service: Vascular;  Laterality: Right; . INTRAOPERATIVE ARTERIOGRAM Right 10/23/2015  Procedure: INTRA OPERATIVE ARTERIOGRAM - RIGHT LOWER LEG;  Surgeon: [name], MD;  Location: [place];  Service: Vascular;  Laterality: Right; . KNEE ARTHROSCOPY Right  . LEFT AND RIGHT HEART CATHETERIZATION WITH CORONARY ANGIOGRAM N/A 09/29/2013   Procedure: LEFT AND RIGHT HEART CATHETERIZATION WITH CORONARY ANGIOGRAM;  Surgeon: Burnell Blanks, MD;  Location: [facility] CATH LAB;  Service: Cardiovascular;  Laterality: N/A; . PERIPHERAL VASCULAR CATHETERIZATION N/A 04/21/2015  Procedure: Abdominal Aortogram w/Lower Extremity;  Surgeon: [name], MD;  Location: [place] CV LAB;  Service: Cardiovascular;  Laterality: N/A; . Stent to right femoral artery   . TRIGGER FINGER RELEASE Bilateral  . VEIN HARVEST Right 10/23/2015  Procedure: [place];  Surgeon: [name], MD;  Location: [facility];  Service: Vascular;  Laterality: Right; HPI: 85 yo male adm to [facility] with CAP.  Pt with PMH + for left frontal lobe infarct 2014 after having left hand and facial numbness, CVA, lung cancer s/p XRT.  Swallow evaluation ordered. Pt CXR showed worsening bilateral lobe pna (left more than right).  He is a smoker.   Subjective: pt awake in chair Assessment / Plan / Recommendation CHL IP CLINICAL IMPRESSIONS 01/06/2019 Clinical Impression Patient presents with overall functional oropharyngeal swallow ability.  His ability to masticate is mildly compromised given his lack of upper dentures.  Pharyngeal swallow is strong without significant residuals.  Laryngeal penetration of thin liquid observed that is [facility] given pt's age.  Pt even tested with sequential swallows of liquids with good airway protection.  Note pt conducting extended breath hold when swallowing liquids to help protect airway with residual clearance.  Of note, barium tablet momentarily lodged at pyriform sinus which pt immediately cleared with 2nd swallow of liquid.  He was NOT sensate to pill lodging.  Pt's symptoms were not demonstrated during MBS.   SLP Visit Diagnosis Dysphagia, unspecified (R13.10) Attention and concentration deficit following -- Frontal lobe and executive function deficit following -- Impact on safety and function Mild aspiration risk   CHL IP TREATMENT  RECOMMENDATION 01/06/2019 Treatment Recommendations Defer until completion of intrumental exam  Prognosis 01/06/2019 Prognosis for Safe Diet Advancement Guarded Barriers to Reach Goals Severity of deficits;Time post onset Barriers/Prognosis Comment -- CHL IP DIET RECOMMENDATION 01/06/2019 SLP Diet Recommendations Thin liquid;Regular solids Liquid Administration via Cup Medication Administration Whole meds with liquid Compensations Slow rate;Small sips/bites Postural Changes Remain semi-upright after after feeds/meals (Comment);Seated upright at 90 degrees   CHL IP OTHER RECOMMENDATIONS 01/06/2019 Recommended Consults -- Oral Care Recommendations Oral care BID Other Recommendations --   CHL IP FOLLOW UP RECOMMENDATIONS 01/06/2019 Follow up Recommendations None   No flowsheet data found.     CHL IP ORAL PHASE 01/06/2019 Oral Phase WFL Oral - Pudding Teaspoon -- Oral - Pudding Cup -- Oral - Honey Teaspoon -- Oral - Honey Cup -- Oral - Nectar Teaspoon -- Oral - Nectar Cup WFL Oral - Nectar Straw -- Oral - Thin Teaspoon -- Oral - Thin Cup WFL Oral - Thin Straw WFL Oral - Puree WFL Oral - Mech Soft WFL Oral - Regular -- Oral - Multi-Consistency WFL Oral - Pill WFL Oral Phase - Comment --  CHL IP PHARYNGEAL PHASE 01/06/2019 Pharyngeal Phase Impaired Pharyngeal- Pudding Teaspoon -- Pharyngeal -- Pharyngeal- Pudding Cup -- Pharyngeal -- Pharyngeal- Honey Teaspoon -- Pharyngeal -- Pharyngeal- Honey Cup -- Pharyngeal -- Pharyngeal- Nectar Teaspoon -- Pharyngeal -- Pharyngeal- Nectar Cup WFL Pharyngeal -- Pharyngeal- Nectar Straw -- Pharyngeal -- Pharyngeal- Thin Teaspoon -- Pharyngeal -- Pharyngeal- Thin Cup WFL;Penetration/Aspiration during swallow Pharyngeal Material enters airway, remains ABOVE vocal cords and not ejected out Pharyngeal- Thin Straw WFL;Penetration/Aspiration during swallow Pharyngeal Material enters airway, remains ABOVE vocal cords and not ejected out Pharyngeal- Puree WFL Pharyngeal -- Pharyngeal- Mechanical  Soft WFL Pharyngeal -- Pharyngeal- Regular -- Pharyngeal -- Pharyngeal- Multi-consistency WFL Pharyngeal -- Pharyngeal- Pill Pharyngeal residue - pyriform Pharyngeal -- Pharyngeal Comment pill lodged at pyriform sinus momentarily without pt awareness- liquid swallow facililtated clearance; pt does an extended breath hold with double swallows when consuming liquids - suspect self created compensation strategy  CHL IP CERVICAL ESOPHAGEAL PHASE 01/06/2019 Cervical Esophageal Phase WFL Pudding Teaspoon -- Pudding Cup -- Honey Teaspoon -- Honey Cup -- Nectar Teaspoon -- Nectar Cup -- Nectar Straw -- Thin Teaspoon -- Thin Cup -- Thin Straw -- Puree -- Mechanical Soft -- Regular -- Multi-consistency -- Pill -- Cervical Esophageal Comment -- Macario Golds 01/06/2019, 12:13 PM  Luanna Salk, MS [facility] SLP Acute Rehab Services Pager [phone] Office [phone]                  Scheduled Meds: . apixaban  5 mg Oral BID  . budesonide (PULMICORT) nebulizer solution  0.5 mg Nebulization BID  . carvedilol  12.5 mg Oral BID WC  . Chlorhexidine Gluconate Cloth  6 each Topical Q0600  . ezetimibe  10 mg Oral Daily  . fluticasone  2 spray Each Nare Daily  . guaiFENesin  1,200 mg Oral BID  . ipratropium  0.5 mg Nebulization Q6H  . levalbuterol  0.63 mg Nebulization Q6H  . loratadine  10 mg Oral Daily  . methylPREDNISolone (SOLU-MEDROL) injection  60 mg Intravenous Q12H  . mupirocin ointment  1 application Nasal BID  . nicotine  14 mg Transdermal Daily  . pantoprazole  40 mg Oral Q0600  . potassium chloride  10 mEq Oral Daily  . sacubitril-valsartan  1 tablet Oral BID  . sodium chloride flush  3 mL Intravenous Q12H   Continuous Infusions: . azithromycin 500 mg (01/06/19 1542)  . ceFEPime (MAXIPIME) IV 2 g (01/07/19 0815)  LOS: 2 days    Time spent: 35 minutes.     Hosie Poisson, MD [facility] Pager [phone]   If 7PM-7AM, please contact night-coverage [email or web address] Password  [facility] 01/07/2019, 12:08 PM

## 2019-01-07 NOTE — Progress Notes (Signed)
Occupational Therapy Treatment Patient Details Name: Gary Odonnell MRN: [id number] DOB: July 24, 1932 Today's Date: 01/07/2019    History of present illness 85 y.o. male admitted with diagnoses of CAP, COPD exac. Hx of A fib, Lung ca, CAD, PAD, CVA, COPD   OT comments  Pt progressing towards OT goals this session. Able to perform transfers and short ambulation at min guard with RW (cues for safe hand placement) Pt with sink level grooming and focus on energy conservation education. Pt continues to benefit from skilled OT while in the acute environment and feel HHOT continues to be essential for safety and to maximize independence and function in home environment.    Follow Up Recommendations  Home health OT;Supervision/Assistance - 24 hour(initially)    Equipment Recommendations  None recommended by OT    Recommendations for Other Services      Precautions / Restrictions Precautions Precautions: Fall Precaution Comments: monitior O2 Restrictions Weight Bearing Restrictions: No       Mobility Bed Mobility               General bed mobility comments: OOB in recliner at beginning and end of session  Transfers Overall transfer level: Needs assistance Equipment used: Rolling walker (2 wheeled) Transfers: Sit to/from Stand Sit to Stand: Supervision         General transfer comment: VCs safety, hand placement.     Balance Overall balance assessment: Needs assistance Sitting-balance support: No upper extremity supported;Feet supported Sitting balance-Leahy Scale: Good Sitting balance - Comments: able to perform dynamic seated tasks   Standing balance support: Bilateral upper extremity supported;During functional activity Standing balance-Leahy Scale: Fair Standing balance comment: dependent on RW for longer distances                           ADL either performed or assessed with clinical judgement   ADL Overall ADL's : Needs assistance/impaired      Grooming: Wash/dry hands;Wash/dry face;Oral care;Min guard;Standing Grooming Details (indicate cue type and reason): decreased activity tolerance for standing grooming                 Toilet Transfer: Min guard;Ambulation;RW Toilet Transfer Details (indicate cue type and reason): cues for RW Toileting- Clothing Manipulation and Hygiene: Min guard;Sit to/from stand Toileting - Clothing Manipulation Details (indicate cue type and reason): use of grab bars for stability during peri care     Functional mobility during ADLs: Passenger transport manager     Praxis      Cognition Arousal/Alertness: Awake/alert Behavior During Therapy: WFL for tasks assessed/performed Overall Cognitive Status: Within Functional Limits for tasks assessed                                          Exercises     Shoulder Instructions       General Comments continued on O2 throughout session; energy conservation education provided - spoke about doing little bits of activity more freuqently throughout the day and thinking about the "high energy" ADL that he needs to do early and compensatory strategies for allowing him to do more while he is recovering    Pertinent Vitals/ Pain       Pain Assessment: Faces Faces Pain Scale: Hurts little more Pain Location: back pain with activity (  chronic) Pain Descriptors / Indicators: Discomfort;Sore;Tightness;Grimacing Pain Intervention(s): Limited activity within patient's tolerance;Monitored during session;Repositioned  Home Living                                          Prior Functioning/Environment              Frequency  Min 2X/week        Progress Toward Goals  OT Goals(current goals can now be found in the care plan section)  Progress towards OT goals: Progressing toward goals  Acute Rehab OT Goals Patient Stated Goal: to breathe better, go home OT Goal Formulation:  With patient Time For Goal Achievement: 01/20/19 Potential to Achieve Goals: Good  Plan Discharge plan remains appropriate;Frequency remains appropriate    Co-evaluation                 AM-PAC OT "6 Clicks" Daily Activity     Outcome Measure   Help from another person eating meals?: None Help from another person taking care of personal grooming?: A Little Help from another person toileting, which includes using toliet, bedpan, or urinal?: A Little Help from another person bathing (including washing, rinsing, drying)?: A Little Help from another person to put on and taking off regular upper body clothing?: None Help from another person to put on and taking off regular lower body clothing?: A Little 6 Click Score: 20    End of Session Equipment Utilized During Treatment: Gait belt;Rolling walker;Oxygen(2L)  OT Visit Diagnosis: Unsteadiness on feet (R26.81);Repeated falls (R29.6);History of falling (Z91.81);Muscle weakness (generalized) (M62.81)   Activity Tolerance Patient tolerated treatment well   Patient Left in chair;with call bell/phone within reach;with chair alarm set   Nurse Communication Mobility status        Time: 1030-1043 OT Time Calculation (min): 13 min  Charges: OT General Charges $OT Visit: 1 Visit OT Treatments $Self Care/Home Management : 8-22 mins  Hulda Humphrey OTR/L Acute Rehabilitation Services Pager: [phone] Office: [place] 01/07/2019, 10:52 AM

## 2019-01-07 NOTE — Progress Notes (Signed)
Physical Therapy Treatment Patient Details Name: Gary Odonnell MRN: [id number] DOB: August 13, 1932 Today's Date: 01/07/2019    History of Present Illness 85 y.o. male admitted with diagnoses of CAP, COPD exac. Hx of A fib, Lung ca, CAD, PAD, CVA, COPD    PT Comments    Pt with improved ambulation distance today, and presented with slight dyspnea midambulation but pt stating his breathing felt "fine". Difficulty checking sats with portable pulse ox, but pt SpO2 reading 92-98% on 1LO2 pre and post ambulation. Pt with LE and trunk weakness, exhibited in difficulty with sit to stand exercises. PT continuing to recommend HHPT to address mobility deficits at home.    Follow Up Recommendations  Home health PT     Equipment Recommendations  None recommended by PT    Recommendations for Other Services       Precautions / Restrictions Precautions Precautions: Fall Precaution Comments: monitior O2 Restrictions Weight Bearing Restrictions: No    Mobility  Bed Mobility Overal bed mobility: Needs Assistance Bed Mobility: Supine to Sit;Sit to Supine     Supine to sit: Supervision Sit to supine: Supervision   General bed mobility comments: Supervision for supine<>sit for safety, increased time and effort to perform. PT flattened bed to simulate home.   Transfers Overall transfer level: Needs assistance Equipment used: Rolling walker (2 wheeled) Transfers: Sit to/from Stand Sit to Stand: Supervision         General transfer comment: Supervision for safety, VC for hand placement.   Ambulation/Gait Ambulation/Gait assistance: Supervision;Min guard Gait Distance (Feet): 60 Feet Assistive device: Rolling walker (2 wheeled) Gait Pattern/deviations: Step-through pattern;Trunk flexed Gait velocity: slightly decr   General Gait Details: Min guard to supervision for safety. Verbal cuing for upright posture, placement in RW x3. Pt with slight dyspnea, but reporting feeling fine. Sats  unable to be read during ambulation, pulse ox not working during mobility. Pre-mobility on 1LO2, pt satting in low-mid 90s. Post-ambulation on 1L, pt sats 98%.    Stairs             Wheelchair Mobility    Modified Rankin (Stroke Patients Only)       Balance Overall balance assessment: Needs assistance Sitting-balance support: No upper extremity supported;Feet supported Sitting balance-Leahy Scale: Good     Standing balance support: Bilateral upper extremity supported;During functional activity Standing balance-Leahy Scale: Fair                              Cognition Arousal/Alertness: Awake/alert Behavior During Therapy: WFL for tasks assessed/performed Overall Cognitive Status: Within Functional Limits for tasks assessed                                        Exercises Other Exercises Other Exercises: Sit to stand x5, seated rest break ~30 seconds between each sit to stand. Verbal cuing for power up and slow lowering back to bed.     General Comments        Pertinent Vitals/Pain Pain Assessment: No/denies pain    Home Living                      Prior Function            PT Goals (current goals can now be found in the care plan section) Acute Rehab PT Goals Patient Stated Goal:  to breathe better, go home PT Goal Formulation: With patient Time For Goal Achievement: 01/20/19 Potential to Achieve Goals: Good Progress towards PT goals: Progressing toward goals    Frequency    Min 3X/week      PT Plan Current plan remains appropriate    Co-evaluation              AM-PAC PT "6 Clicks" Mobility   Outcome Measure  Help needed turning from your back to your side while in a flat bed without using bedrails?: None Help needed moving from lying on your back to sitting on the side of a flat bed without using bedrails?: A Little Help needed moving to and from a bed to a chair (including a wheelchair)?: A  Little Help needed standing up from a chair using your arms (e.g., wheelchair or bedside chair)?: A Little Help needed to walk in hospital room?: A Little Help needed climbing 3-5 steps with a railing? : A Little 6 Click Score: 19    End of Session Equipment Utilized During Treatment: Gait belt;Oxygen Activity Tolerance: Patient limited by fatigue Patient left: in bed;with call bell/phone within reach;with bed alarm set Nurse Communication: Other (comment)(unable to locate RN post-session) PT Visit Diagnosis: Unsteadiness on feet (R26.81);Muscle weakness (generalized) (M62.81);Difficulty in walking, not elsewhere classified (R26.2)     Time: 6147-0929 PT Time Calculation (min) (ACUTE ONLY): 16 min  Charges:  $Gait Training: 8-22 mins                     Julien Girt, PT Acute Rehabilitation Services Pager [phone]  Office [phone]   Roxine Caddy D Elonda Husky 01/07/2019, 4:56 PM

## 2019-01-07 NOTE — Care Management Note (Signed)
Case Management Note  Patient Details  Name: Gary Odonnell MRN: [id number] Date of Birth: 1932/06/20  Subjective/Objective: PNA. From home w/spouse. Has rw,02-has travel tank. Hx: actively smokes. [facility] chosen for HHPT-[facility] rep Santiago Glad aware & following for d/c.                   Action/Plan:d/c home w/HHC.   Expected Discharge Date:  (unknown)               Expected Discharge Plan:  [place]  In-House Referral:     Discharge planning Services  CM Consult  Post Acute Care Choice:  Durable Medical Equipment(home 02,rw) Choice offered to:  Patient  DME Arranged:    DME Agency:     HH Arranged:  PT [place]:  [place]  Status of Service:  In process, will continue to follow  If discussed at Long Length of Stay Meetings, dates discussed:    Additional Comments:  Dessa Phi, RN 01/07/2019, 11:15 AM

## 2019-01-08 LAB — CULTURE, RESPIRATORY

## 2019-01-08 LAB — BASIC METABOLIC PANEL
Anion gap: 6 (ref 5–15)
BUN: 25 mg/dL — ABNORMAL HIGH (ref 8–23)
CALCIUM: 9.1 mg/dL (ref 8.9–10.3)
CO2: 24 mmol/L (ref 22–32)
Chloride: 107 mmol/L (ref 98–111)
Creatinine, Ser: 1.21 mg/dL (ref 0.61–1.24)
GFR calc Af Amer: >60 mL/min (ref >60)
GFR, EST NON AFRICAN AMERICAN: 55 mL/min — AB (ref >60)
GLUCOSE: 275 mg/dL — AB (ref 70–99)
Potassium: 4.8 mmol/L (ref 3.5–5.1)
Sodium: 137 mmol/L (ref 135–145)

## 2019-01-08 LAB — CULTURE, RESPIRATORY W GRAM STAIN
Culture: NORMAL
Gram Stain: NONE SEEN

## 2019-01-08 LAB — GLUCOSE, CAPILLARY: Glucose-Capillary: 213 mg/dL — ABNORMAL HIGH (ref 70–99)

## 2019-01-08 MED ORDER — NICOTINE 14 MG/24HR TD PT24: 14.0000 mg | MEDICATED_PATCH | Freq: Every day | TRANSDERMAL | 0 refills | Status: DC

## 2019-01-08 MED ORDER — AZITHROMYCIN 500 MG PO TABS: 500.0000 mg | ORAL_TABLET | Freq: Every day | ORAL | 0 refills | Status: DC

## 2019-01-08 MED ORDER — CARVEDILOL 6.25 MG PO TABS: 6.2500 mg | ORAL_TABLET | Freq: Two times a day (BID) | ORAL | 0 refills | Status: DC

## 2019-01-08 MED ORDER — PREDNISONE 20 MG PO TABS: ORAL_TABLET | ORAL | 0 refills | Status: DC

## 2019-01-08 MED ORDER — IPRATROPIUM BROMIDE 0.02 % IN SOLN: 0.5000 mg | Freq: Four times a day (QID) | RESPIRATORY_TRACT | 12 refills | Status: DC | PRN

## 2019-01-08 MED ORDER — FLUTICASONE PROPIONATE 50 MCG/ACT NA SUSP: 2.0000 | Freq: Every day | NASAL | 2 refills | Status: DC

## 2019-01-08 MED ORDER — GUAIFENESIN ER 600 MG PO TB12: 1200.0000 mg | ORAL_TABLET | Freq: Two times a day (BID) | ORAL | 0 refills | Status: DC

## 2019-01-08 MED ORDER — LEVALBUTEROL HCL 0.63 MG/3ML IN NEBU: 0.6300 mg | INHALATION_SOLUTION | RESPIRATORY_TRACT | 12 refills | Status: DC | PRN

## 2019-01-09 ENCOUNTER — Other Ambulatory Visit: Payer: Self-pay

## 2019-01-09 ENCOUNTER — Inpatient Hospital Stay (HOSPITAL_COMMUNITY)
Admission: EM | Admit: 2019-01-09 | Discharge: 2019-01-18 | DRG: 291 | Disposition: A | Discharge status: Home Health | Payer: Medicare Other | Attending: Family Medicine | Admitting: Family Medicine

## 2019-01-09 DIAGNOSIS — I5022 Chronic systolic (congestive) heart failure: Secondary | ICD-10-CM

## 2019-01-09 DIAGNOSIS — I130 Hypertensive heart and chronic kidney disease with heart failure and stage 1 through stage 4 chronic kidney disease, or unspecified chronic kidney disease: Secondary | ICD-10-CM | POA: Diagnosis not present

## 2019-01-09 DIAGNOSIS — J189 Pneumonia, unspecified organism: Secondary | ICD-10-CM

## 2019-01-09 DIAGNOSIS — Z716 Tobacco abuse counseling: Secondary | ICD-10-CM

## 2019-01-09 DIAGNOSIS — N35911 Unspecified urethral stricture, male, meatal: Secondary | ICD-10-CM | POA: Diagnosis present

## 2019-01-09 DIAGNOSIS — Z66 Do not resuscitate: Secondary | ICD-10-CM | POA: Diagnosis present

## 2019-01-09 DIAGNOSIS — Z20828 Contact with and (suspected) exposure to other viral communicable diseases: Secondary | ICD-10-CM | POA: Diagnosis present

## 2019-01-09 DIAGNOSIS — I4819 Other persistent atrial fibrillation: Secondary | ICD-10-CM | POA: Diagnosis not present

## 2019-01-09 DIAGNOSIS — Z7951 Long term (current) use of inhaled steroids: Secondary | ICD-10-CM

## 2019-01-09 DIAGNOSIS — Z8249 Family history of ischemic heart disease and other diseases of the circulatory system: Secondary | ICD-10-CM

## 2019-01-09 DIAGNOSIS — Z79899 Other long term (current) drug therapy: Secondary | ICD-10-CM

## 2019-01-09 DIAGNOSIS — I5043 Acute on chronic combined systolic (congestive) and diastolic (congestive) heart failure: Secondary | ICD-10-CM | POA: Diagnosis present

## 2019-01-09 DIAGNOSIS — E1122 Type 2 diabetes mellitus with diabetic chronic kidney disease: Secondary | ICD-10-CM | POA: Diagnosis present

## 2019-01-09 DIAGNOSIS — J441 Chronic obstructive pulmonary disease with (acute) exacerbation: Secondary | ICD-10-CM | POA: Diagnosis present

## 2019-01-09 DIAGNOSIS — D7589 Other specified diseases of blood and blood-forming organs: Secondary | ICD-10-CM | POA: Diagnosis present

## 2019-01-09 DIAGNOSIS — E782 Mixed hyperlipidemia: Secondary | ICD-10-CM | POA: Diagnosis present

## 2019-01-09 DIAGNOSIS — R7989 Other specified abnormal findings of blood chemistry: Secondary | ICD-10-CM | POA: Diagnosis present

## 2019-01-09 DIAGNOSIS — Z7901 Long term (current) use of anticoagulants: Secondary | ICD-10-CM

## 2019-01-09 DIAGNOSIS — I2510 Atherosclerotic heart disease of native coronary artery without angina pectoris: Secondary | ICD-10-CM | POA: Diagnosis present

## 2019-01-09 DIAGNOSIS — F17290 Nicotine dependence, other tobacco product, uncomplicated: Secondary | ICD-10-CM | POA: Diagnosis present

## 2019-01-09 DIAGNOSIS — E119 Type 2 diabetes mellitus without complications: Secondary | ICD-10-CM

## 2019-01-09 DIAGNOSIS — I452 Bifascicular block: Secondary | ICD-10-CM | POA: Diagnosis present

## 2019-01-09 DIAGNOSIS — I5042 Chronic combined systolic (congestive) and diastolic (congestive) heart failure: Secondary | ICD-10-CM | POA: Diagnosis present

## 2019-01-09 DIAGNOSIS — R339 Retention of urine, unspecified: Secondary | ICD-10-CM | POA: Diagnosis present

## 2019-01-09 DIAGNOSIS — J9601 Acute respiratory failure with hypoxia: Secondary | ICD-10-CM | POA: Diagnosis present

## 2019-01-09 DIAGNOSIS — Z923 Personal history of irradiation: Secondary | ICD-10-CM

## 2019-01-09 DIAGNOSIS — R911 Solitary pulmonary nodule: Secondary | ICD-10-CM | POA: Diagnosis present

## 2019-01-09 DIAGNOSIS — W010XXA Fall on same level from slipping, tripping and stumbling without subsequent striking against object, initial encounter: Secondary | ICD-10-CM | POA: Diagnosis present

## 2019-01-09 DIAGNOSIS — Z951 Presence of aortocoronary bypass graft: Secondary | ICD-10-CM

## 2019-01-09 DIAGNOSIS — I509 Heart failure, unspecified: Secondary | ICD-10-CM

## 2019-01-09 DIAGNOSIS — Z8349 Family history of other endocrine, nutritional and metabolic diseases: Secondary | ICD-10-CM

## 2019-01-09 DIAGNOSIS — I5023 Acute on chronic systolic (congestive) heart failure: Secondary | ICD-10-CM | POA: Diagnosis present

## 2019-01-09 DIAGNOSIS — I255 Ischemic cardiomyopathy: Secondary | ICD-10-CM | POA: Diagnosis present

## 2019-01-09 DIAGNOSIS — E1165 Type 2 diabetes mellitus with hyperglycemia: Secondary | ICD-10-CM | POA: Diagnosis present

## 2019-01-09 DIAGNOSIS — D72829 Elevated white blood cell count, unspecified: Secondary | ICD-10-CM

## 2019-01-09 DIAGNOSIS — J181 Lobar pneumonia, unspecified organism: Secondary | ICD-10-CM

## 2019-01-09 DIAGNOSIS — E1151 Type 2 diabetes mellitus with diabetic peripheral angiopathy without gangrene: Secondary | ICD-10-CM | POA: Diagnosis present

## 2019-01-09 DIAGNOSIS — J9811 Atelectasis: Secondary | ICD-10-CM | POA: Diagnosis not present

## 2019-01-09 DIAGNOSIS — I420 Dilated cardiomyopathy: Secondary | ICD-10-CM | POA: Diagnosis present

## 2019-01-09 DIAGNOSIS — M549 Dorsalgia, unspecified: Secondary | ICD-10-CM

## 2019-01-09 DIAGNOSIS — C3490 Malignant neoplasm of unspecified part of unspecified bronchus or lung: Secondary | ICD-10-CM | POA: Diagnosis present

## 2019-01-09 DIAGNOSIS — Z8673 Personal history of transient ischemic attack (TIA), and cerebral infarction without residual deficits: Secondary | ICD-10-CM

## 2019-01-09 DIAGNOSIS — D696 Thrombocytopenia, unspecified: Secondary | ICD-10-CM | POA: Diagnosis present

## 2019-01-09 DIAGNOSIS — I10 Essential (primary) hypertension: Secondary | ICD-10-CM | POA: Diagnosis present

## 2019-01-09 DIAGNOSIS — Y842 Radiological procedure and radiotherapy as the cause of abnormal reaction of the patient, or of later complication, without mention of misadventure at the time of the procedure: Secondary | ICD-10-CM | POA: Diagnosis present

## 2019-01-09 DIAGNOSIS — R338 Other retention of urine: Secondary | ICD-10-CM | POA: Diagnosis not present

## 2019-01-09 DIAGNOSIS — W19XXXA Unspecified fall, initial encounter: Secondary | ICD-10-CM

## 2019-01-09 DIAGNOSIS — J449 Chronic obstructive pulmonary disease, unspecified: Secondary | ICD-10-CM | POA: Diagnosis present

## 2019-01-09 DIAGNOSIS — Z22322 Carrier or suspected carrier of Methicillin resistant Staphylococcus aureus: Secondary | ICD-10-CM

## 2019-01-09 DIAGNOSIS — Z833 Family history of diabetes mellitus: Secondary | ICD-10-CM

## 2019-01-09 DIAGNOSIS — N183 Chronic kidney disease, stage 3 (moderate): Secondary | ICD-10-CM | POA: Diagnosis present

## 2019-01-09 DIAGNOSIS — N179 Acute kidney failure, unspecified: Secondary | ICD-10-CM | POA: Diagnosis present

## 2019-01-09 DIAGNOSIS — I4891 Unspecified atrial fibrillation: Secondary | ICD-10-CM | POA: Diagnosis present

## 2019-01-09 NOTE — ED Triage Notes (Signed)
Pt states he was discharged from [facility] x 2 days ago for SOB and pneumonia and pt has been having increased SOB since discharge

## 2019-01-10 ENCOUNTER — Observation Stay (HOSPITAL_COMMUNITY): Payer: Medicare Other

## 2019-01-10 ENCOUNTER — Encounter (HOSPITAL_COMMUNITY): Payer: Self-pay | Admitting: Family Medicine

## 2019-01-10 ENCOUNTER — Other Ambulatory Visit: Payer: Self-pay

## 2019-01-10 ENCOUNTER — Emergency Department (HOSPITAL_COMMUNITY): Payer: Medicare Other

## 2019-01-10 DIAGNOSIS — E119 Type 2 diabetes mellitus without complications: Secondary | ICD-10-CM

## 2019-01-10 DIAGNOSIS — J9601 Acute respiratory failure with hypoxia: Secondary | ICD-10-CM

## 2019-01-10 DIAGNOSIS — J189 Pneumonia, unspecified organism: Secondary | ICD-10-CM | POA: Diagnosis not present

## 2019-01-10 DIAGNOSIS — I5042 Chronic combined systolic (congestive) and diastolic (congestive) heart failure: Secondary | ICD-10-CM | POA: Diagnosis not present

## 2019-01-10 DIAGNOSIS — I4891 Unspecified atrial fibrillation: Secondary | ICD-10-CM | POA: Diagnosis not present

## 2019-01-10 DIAGNOSIS — J439 Emphysema, unspecified: Secondary | ICD-10-CM | POA: Diagnosis not present

## 2019-01-10 DIAGNOSIS — N183 Chronic kidney disease, stage 3 unspecified: Secondary | ICD-10-CM | POA: Diagnosis present

## 2019-01-10 DIAGNOSIS — I2510 Atherosclerotic heart disease of native coronary artery without angina pectoris: Secondary | ICD-10-CM | POA: Diagnosis not present

## 2019-01-10 DIAGNOSIS — N179 Acute kidney failure, unspecified: Secondary | ICD-10-CM | POA: Diagnosis not present

## 2019-01-10 DIAGNOSIS — J441 Chronic obstructive pulmonary disease with (acute) exacerbation: Secondary | ICD-10-CM | POA: Diagnosis not present

## 2019-01-10 DIAGNOSIS — J181 Lobar pneumonia, unspecified organism: Secondary | ICD-10-CM | POA: Diagnosis not present

## 2019-01-10 LAB — CBG MONITORING, ED
Glucose-Capillary: 201 mg/dL — ABNORMAL HIGH (ref 70–99)
Glucose-Capillary: 199 mg/dL — ABNORMAL HIGH (ref 70–99)
Glucose-Capillary: 237 mg/dL — ABNORMAL HIGH (ref 70–99)
Glucose-Capillary: 244 mg/dL — ABNORMAL HIGH (ref 70–99)

## 2019-01-10 LAB — COMPREHENSIVE METABOLIC PANEL
ALT: 43 U/L (ref 0–44)
AST: 23 U/L (ref 15–41)
Albumin: 3.6 g/dL (ref 3.5–5.0)
Alkaline Phosphatase: 56 U/L (ref 38–126)
Anion gap: 6 (ref 5–15)
BUN: 30 mg/dL — ABNORMAL HIGH (ref 8–23)
CO2: 27 mmol/L (ref 22–32)
CREATININE: 1.63 mg/dL — AB (ref 0.61–1.24)
Calcium: 9.3 mg/dL (ref 8.9–10.3)
Chloride: 107 mmol/L (ref 98–111)
GFR calc Af Amer: 44 mL/min — ABNORMAL LOW (ref >60)
GFR calc non Af Amer: 38 mL/min — ABNORMAL LOW (ref >60)
Glucose, Bld: 214 mg/dL — ABNORMAL HIGH (ref 70–99)
Potassium: 4.8 mmol/L (ref 3.5–5.1)
SODIUM: 140 mmol/L (ref 135–145)
Total Bilirubin: 1.5 mg/dL — ABNORMAL HIGH (ref 0.3–1.2)
Total Protein: 6.8 g/dL (ref 6.5–8.1)

## 2019-01-10 LAB — BLOOD GAS, ARTERIAL
ACID-BASE DEFICIT: 1.9 mmol/L (ref 0.0–2.0)
Bicarbonate: 22 mmol/L (ref 20.0–28.0)
FIO2: 28
O2 SAT: 94.8 %
PH ART: 7.283 — AB (ref 7.350–7.450)
Patient temperature: 37
pCO2 arterial: 52.1 mmHg — ABNORMAL HIGH (ref 32.0–48.0)
pO2, Arterial: 82.9 mmHg — ABNORMAL LOW (ref 83.0–108.0)

## 2019-01-10 LAB — URINALYSIS, ROUTINE W REFLEX MICROSCOPIC
BILIRUBIN URINE: NEGATIVE
Bacteria, UA: NONE SEEN
Glucose, UA: 50 mg/dL — AB
Ketones, ur: NEGATIVE mg/dL
LEUKOCYTE UA: NEGATIVE
NITRITE: NEGATIVE
Protein, ur: 100 mg/dL — AB
Specific Gravity, Urine: 1.024 (ref 1.005–1.030)
pH: 5 (ref 5.0–8.0)

## 2019-01-10 LAB — TROPONIN I
Troponin I: 0.07 ng/mL (ref <0.03)
Troponin I: 0.08 ng/mL (ref <0.03)
Troponin I: 0.09 ng/mL (ref <0.03)
Troponin I: 0.12 ng/mL (ref <0.03)

## 2019-01-10 LAB — CBC WITH DIFFERENTIAL/PLATELET
Abs Immature Granulocytes: 0.04 10*3/uL (ref 0.00–0.07)
BASOS PCT: 0 %
Basophils Absolute: 0 10*3/uL (ref 0.0–0.1)
Eosinophils Absolute: 0.1 10*3/uL (ref 0.0–0.5)
Eosinophils Relative: 1 %
HCT: 46.5 % (ref 39.0–52.0)
Hemoglobin: 13.4 g/dL (ref 13.0–17.0)
Immature Granulocytes: 1 %
Lymphocytes Relative: 15 %
Lymphs Abs: 1.3 10*3/uL (ref 0.7–4.0)
MCH: 29.5 pg (ref 26.0–34.0)
MCHC: 28.8 g/dL — ABNORMAL LOW (ref 30.0–36.0)
MCV: 102.2 fL — ABNORMAL HIGH (ref 80.0–100.0)
Monocytes Absolute: 0.5 10*3/uL (ref 0.1–1.0)
Monocytes Relative: 6 %
NEUTROS PCT: 77 %
Neutro Abs: 6.6 10*3/uL (ref 1.7–7.7)
Platelets: 122 10*3/uL — ABNORMAL LOW (ref 150–400)
RBC: 4.55 MIL/uL (ref 4.22–5.81)
RDW: 16.6 % — ABNORMAL HIGH (ref 11.5–15.5)
WBC: 8.5 10*3/uL (ref 4.0–10.5)
nRBC: 0 % (ref 0.0–0.2)

## 2019-01-10 LAB — CULTURE, BLOOD (ROUTINE X 2)
Culture: NO GROWTH
Culture: NO GROWTH
Special Requests: ADEQUATE
Special Requests: ADEQUATE

## 2019-01-10 LAB — PROCALCITONIN

## 2019-01-10 LAB — GLUCOSE, CAPILLARY: GLUCOSE-CAPILLARY: 208 mg/dL — AB (ref 70–99)

## 2019-01-10 LAB — BRAIN NATRIURETIC PEPTIDE: B Natriuretic Peptide: 2650 pg/mL — ABNORMAL HIGH (ref 0.0–100.0)

## 2019-01-10 LAB — STREP PNEUMONIAE URINARY ANTIGEN: Strep Pneumo Urinary Antigen: NEGATIVE

## 2019-01-10 LAB — SODIUM, URINE, RANDOM: Sodium, Ur: 107 mmol/L

## 2019-01-10 LAB — CREATININE, URINE, RANDOM: Creatinine, Urine: 139.82 mg/dL

## 2019-01-10 LAB — LACTIC ACID, PLASMA
LACTIC ACID, VENOUS: 1.6 mmol/L (ref 0.5–1.9)
Lactic Acid, Venous: 2.4 mmol/L (ref 0.5–1.9)

## 2019-01-10 MED ORDER — SODIUM CHLORIDE 0.9 % IV SOLN: 1.0000 g | Freq: Three times a day (TID) | INTRAVENOUS | Status: DC

## 2019-01-10 MED ORDER — INSULIN ASPART 100 UNIT/ML ~~LOC~~ SOLN
0.0000 [IU] | Freq: Every day | SUBCUTANEOUS | Status: DC
  Administered 2019-01-10: 2 [IU] via SUBCUTANEOUS
  Administered 2019-01-11: 3 [IU] via SUBCUTANEOUS
  Administered 2019-01-12 – 2019-01-17 (×2): 2 [IU] via SUBCUTANEOUS

## 2019-01-10 MED ORDER — SODIUM CHLORIDE 0.9% FLUSH
3.0000 mL | Freq: Two times a day (BID) | INTRAVENOUS | Status: DC
  Administered 2019-01-10 – 2019-01-17 (×9): 3 mL via INTRAVENOUS

## 2019-01-10 MED ORDER — SODIUM CHLORIDE 0.9% FLUSH
3.0000 mL | Freq: Two times a day (BID) | INTRAVENOUS | Status: DC
  Administered 2019-01-10 – 2019-01-18 (×9): 3 mL via INTRAVENOUS

## 2019-01-10 MED ORDER — ALBUTEROL (5 MG/ML) CONTINUOUS INHALATION SOLN
10.0000 mg/h | INHALATION_SOLUTION | Freq: Once | RESPIRATORY_TRACT | Status: AC
  Administered 2019-01-10: 10 mg/h via RESPIRATORY_TRACT
  Filled 2019-01-10: qty 20

## 2019-01-10 MED ORDER — IPRATROPIUM-ALBUTEROL 0.5-2.5 (3) MG/3ML IN SOLN
3.0000 mL | RESPIRATORY_TRACT | Status: DC | PRN
  Administered 2019-01-10: 3 mL via RESPIRATORY_TRACT
  Filled 2019-01-10: qty 3

## 2019-01-10 MED ORDER — ACETAMINOPHEN 325 MG PO TABS: 650.0000 mg | ORAL_TABLET | Freq: Four times a day (QID) | ORAL | Status: DC | PRN

## 2019-01-10 MED ORDER — METHYLPREDNISOLONE SODIUM SUCC 125 MG IJ SOLR
60.0000 mg | Freq: Four times a day (QID) | INTRAMUSCULAR | Status: DC
  Administered 2019-01-10 – 2019-01-11 (×7): 60 mg via INTRAVENOUS
  Filled 2019-01-10 (×7): qty 2

## 2019-01-10 MED ORDER — SODIUM CHLORIDE 0.9 % IV SOLN
Freq: Once | INTRAVENOUS | Status: AC
  Administered 2019-01-10: 02:00:00 via INTRAVENOUS

## 2019-01-10 MED ORDER — SALINE SPRAY 0.65 % NA SOLN
1.0000 | NASAL | Status: DC | PRN
  Filled 2019-01-10 (×2): qty 44

## 2019-01-10 MED ORDER — SODIUM CHLORIDE 0.9 % IV SOLN
1.0000 g | Freq: Once | INTRAVENOUS | Status: AC
  Administered 2019-01-10: 1 g via INTRAVENOUS
  Filled 2019-01-10: qty 1

## 2019-01-10 MED ORDER — ACETAMINOPHEN 650 MG RE SUPP: 650.0000 mg | Freq: Four times a day (QID) | RECTAL | Status: DC | PRN

## 2019-01-10 MED ORDER — APIXABAN 2.5 MG PO TABS
2.5000 mg | ORAL_TABLET | Freq: Two times a day (BID) | ORAL | Status: DC
  Administered 2019-01-10 – 2019-01-11 (×2): 2.5 mg via ORAL
  Filled 2019-01-10 (×4): qty 1

## 2019-01-10 MED ORDER — HYDROCODONE-ACETAMINOPHEN 5-325 MG PO TABS
1.0000 | ORAL_TABLET | ORAL | Status: DC | PRN
  Filled 2019-01-10: qty 1

## 2019-01-10 MED ORDER — INSULIN ASPART 100 UNIT/ML ~~LOC~~ SOLN
0.0000 [IU] | Freq: Three times a day (TID) | SUBCUTANEOUS | Status: DC
  Administered 2019-01-10: 3 [IU] via SUBCUTANEOUS
  Administered 2019-01-10: 2 [IU] via SUBCUTANEOUS
  Administered 2019-01-10: 3 [IU] via SUBCUTANEOUS
  Administered 2019-01-11: 2 [IU] via SUBCUTANEOUS
  Administered 2019-01-11: 3 [IU] via SUBCUTANEOUS
  Administered 2019-01-11: 5 [IU] via SUBCUTANEOUS
  Administered 2019-01-12: 2 [IU] via SUBCUTANEOUS
  Administered 2019-01-12: 5 [IU] via SUBCUTANEOUS
  Administered 2019-01-12: 3 [IU] via SUBCUTANEOUS
  Administered 2019-01-13: 2 [IU] via SUBCUTANEOUS
  Administered 2019-01-13 – 2019-01-14 (×3): 3 [IU] via SUBCUTANEOUS
  Administered 2019-01-14 – 2019-01-15 (×3): 2 [IU] via SUBCUTANEOUS
  Administered 2019-01-15: 3 [IU] via SUBCUTANEOUS
  Administered 2019-01-15: 7 [IU] via SUBCUTANEOUS
  Administered 2019-01-16: 2 [IU] via SUBCUTANEOUS
  Administered 2019-01-16 (×2): 3 [IU] via SUBCUTANEOUS
  Administered 2019-01-17: 5 [IU] via SUBCUTANEOUS
  Administered 2019-01-17 – 2019-01-18 (×3): 3 [IU] via SUBCUTANEOUS
  Administered 2019-01-18: 2 [IU] via SUBCUTANEOUS
  Filled 2019-01-10 (×3): qty 1

## 2019-01-10 MED ORDER — IPRATROPIUM BROMIDE 0.02 % IN SOLN
0.5000 mg | Freq: Once | RESPIRATORY_TRACT | Status: AC
  Administered 2019-01-10: 0.5 mg via RESPIRATORY_TRACT
  Filled 2019-01-10: qty 2.5

## 2019-01-10 MED ORDER — BUDESONIDE 0.25 MG/2ML IN SUSP
0.2500 mg | Freq: Two times a day (BID) | RESPIRATORY_TRACT | Status: DC
  Administered 2019-01-10 – 2019-01-18 (×16): 0.25 mg via RESPIRATORY_TRACT
  Filled 2019-01-10 (×17): qty 2

## 2019-01-10 MED ORDER — ONDANSETRON HCL 4 MG/2ML IJ SOLN: 4.0000 mg | Freq: Four times a day (QID) | INTRAMUSCULAR | Status: DC | PRN

## 2019-01-10 MED ORDER — FLUTICASONE PROPIONATE 50 MCG/ACT NA SUSP
2.0000 | Freq: Every day | NASAL | Status: DC
  Administered 2019-01-10 – 2019-01-18 (×9): 2 via NASAL
  Filled 2019-01-10 (×4): qty 16

## 2019-01-10 MED ORDER — VANCOMYCIN HCL IN DEXTROSE 1-5 GM/200ML-% IV SOLN
1000.0000 mg | Freq: Once | INTRAVENOUS | Status: AC
  Administered 2019-01-10: 1000 mg via INTRAVENOUS
  Filled 2019-01-10: qty 200

## 2019-01-10 MED ORDER — SENNOSIDES-DOCUSATE SODIUM 8.6-50 MG PO TABS
1.0000 | ORAL_TABLET | Freq: Every evening | ORAL | Status: DC | PRN
  Filled 2019-01-10: qty 1

## 2019-01-10 MED ORDER — HYDRALAZINE HCL 25 MG PO TABS
25.0000 mg | ORAL_TABLET | Freq: Three times a day (TID) | ORAL | Status: DC
  Administered 2019-01-10 – 2019-01-18 (×26): 25 mg via ORAL
  Filled 2019-01-10 (×26): qty 1

## 2019-01-10 MED ORDER — VANCOMYCIN HCL 10 G IV SOLR
1250.0000 mg | INTRAVENOUS | Status: DC
  Administered 2019-01-10: 1250 mg via INTRAVENOUS
  Filled 2019-01-10 (×2): qty 1250

## 2019-01-10 MED ORDER — APIXABAN 5 MG PO TABS
5.0000 mg | ORAL_TABLET | Freq: Two times a day (BID) | ORAL | Status: DC
  Administered 2019-01-10: 5 mg via ORAL
  Filled 2019-01-10: qty 1

## 2019-01-10 MED ORDER — SODIUM CHLORIDE 0.9 % IV BOLUS
500.0000 mL | Freq: Once | INTRAVENOUS | Status: AC
  Administered 2019-01-10: 500 mL via INTRAVENOUS

## 2019-01-10 MED ORDER — HYDRALAZINE HCL 20 MG/ML IJ SOLN: 10.0000 mg | Freq: Four times a day (QID) | INTRAMUSCULAR | Status: DC | PRN

## 2019-01-10 MED ORDER — SODIUM CHLORIDE 0.9 % IV SOLN
250.0000 mL | INTRAVENOUS | Status: DC | PRN
  Administered 2019-01-10: 250 mL via INTRAVENOUS

## 2019-01-10 MED ORDER — NICOTINE 14 MG/24HR TD PT24
14.0000 mg | MEDICATED_PATCH | Freq: Every day | TRANSDERMAL | Status: DC
  Administered 2019-01-11 – 2019-01-18 (×8): 14 mg via TRANSDERMAL
  Filled 2019-01-10 (×8): qty 1

## 2019-01-10 MED ORDER — ONDANSETRON HCL 4 MG PO TABS: 4.0000 mg | ORAL_TABLET | Freq: Four times a day (QID) | ORAL | Status: DC | PRN

## 2019-01-10 MED ORDER — METHYLPREDNISOLONE SODIUM SUCC 125 MG IJ SOLR
80.0000 mg | Freq: Once | INTRAMUSCULAR | Status: AC
  Administered 2019-01-10: 80 mg via INTRAVENOUS
  Filled 2019-01-10: qty 2

## 2019-01-10 MED ORDER — GUAIFENESIN ER 600 MG PO TB12
1200.0000 mg | ORAL_TABLET | Freq: Two times a day (BID) | ORAL | Status: DC
  Administered 2019-01-10 – 2019-01-18 (×17): 1200 mg via ORAL
  Filled 2019-01-10 (×19): qty 2

## 2019-01-10 MED ORDER — IPRATROPIUM-ALBUTEROL 0.5-2.5 (3) MG/3ML IN SOLN
3.0000 mL | Freq: Four times a day (QID) | RESPIRATORY_TRACT | Status: DC
  Administered 2019-01-10 – 2019-01-14 (×14): 3 mL via RESPIRATORY_TRACT
  Filled 2019-01-10 (×15): qty 3

## 2019-01-10 MED ORDER — ALBUTEROL SULFATE (2.5 MG/3ML) 0.083% IN NEBU
2.5000 mg | INHALATION_SOLUTION | RESPIRATORY_TRACT | Status: DC | PRN
  Administered 2019-01-17: 2.5 mg via RESPIRATORY_TRACT
  Filled 2019-01-10: qty 3

## 2019-01-10 MED ORDER — SODIUM CHLORIDE 0.9% FLUSH: 3.0000 mL | INTRAVENOUS | Status: DC | PRN

## 2019-01-10 MED ORDER — EZETIMIBE 10 MG PO TABS
10.0000 mg | ORAL_TABLET | Freq: Every day | ORAL | Status: DC
  Administered 2019-01-10 – 2019-01-18 (×9): 10 mg via ORAL
  Filled 2019-01-10 (×10): qty 1

## 2019-01-10 MED ORDER — SODIUM CHLORIDE 0.9 % IV SOLN
INTRAVENOUS | Status: AC
  Filled 2019-01-10: qty 2

## 2019-01-10 MED ORDER — CARVEDILOL 3.125 MG PO TABS
6.2500 mg | ORAL_TABLET | Freq: Two times a day (BID) | ORAL | Status: DC
  Administered 2019-01-10 – 2019-01-18 (×19): 6.25 mg via ORAL
  Filled 2019-01-10 (×5): qty 2
  Filled 2019-01-10: qty 1
  Filled 2019-01-10 (×10): qty 2
  Filled 2019-01-10: qty 1
  Filled 2019-01-10: qty 2

## 2019-01-10 MED ORDER — SODIUM CHLORIDE 0.9 % IV SOLN
2.0000 g | INTRAVENOUS | Status: DC
  Administered 2019-01-10: 2 g via INTRAVENOUS
  Filled 2019-01-10 (×2): qty 2

## 2019-01-10 NOTE — ED Notes (Signed)
Notified Tiffany, AC to bring Flonase.

## 2019-01-10 NOTE — ED Provider Notes (Signed)
[facility] EMERGENCY DEPARTMENT Provider Note   CSN: [id number] Arrival date & time: 01/09/19  2346     History   Chief Complaint Chief Complaint  Patient presents with  . Shortness of Breath    HPI Gary Odonnell is a 85 y.o. male.  Patient with history of COPD, atrial fibrillation on Eliquis, peripheral vascular disease, previous lung cancer, CAD status post CABG and previous stroke presenting with difficulty breathing and coughing the past 1 day.  He was just discharged from [facility] on February 14 after a 3-day stay for COPD exacerbation with pneumonia.  Family states he was discharged with prednisone as well as antibiotics but his breathing became worse today despite treatments at home.  He describes the need to cough but nothing coming up.  He denies fevers, chills, nausea or vomiting.  No chest pain.  No leg pain or leg swelling.  When asked about home oxygen, family states he has a "small container" and is supposed to have home health come to his house on Monday.  It does not seem like he is normally on oxygen at home.  The history is provided by the patient and a relative. The history is limited by the condition of the patient.  Shortness of Breath  Associated symptoms: cough   Associated symptoms: no abdominal pain, no chest pain, no fever, no headaches and no vomiting     Past Medical History:  Diagnosis Date  . Cancer ([place])    L lung  . Coronary artery disease   . Hypercholesteremia   . Hypertension   . PAD (peripheral artery disease) ([place])   . Pneumonia 09/2013  . Radiation april 2016  . Solitary pulmonary nodule on lung CT 12/13/13   11/15/14  . Stroke [facility]) 11/2008    Patient Active Problem List   Diagnosis Date Noted  . COPD with acute exacerbation ([place]) 01/05/2019  . A-fib ([place]) 01/05/2019  . Tobacco abuse   . Left shoulder pain 09/01/2018  . Iliac artery stenosis, left ([place]) 04/22/2015  . PAD (peripheral artery disease) ([place]) 04/21/2015  .  Primary cancer of left lower lobe of lung ([place]) 02/02/2015  . Abnormal PET scan of colon 01/04/2015  . Solitary pulmonary nodule on lung CT   . Carpal tunnel syndrome, left 05/03/2014  . Acute renal failure ([place]) 09/27/2013  . HTN (hypertension) 09/26/2013  . CHF (congestive heart failure) ([place]) 09/26/2013  . CVA (cerebral infarction) 09/26/2013  . CAP (community acquired pneumonia) 09/26/2013  . CAD (coronary atherosclerotic disease) 09/26/2013  . S/P CABG x 4 09/26/2013    Past Surgical History:  Procedure Laterality Date  . Angioplasty to Left femoral artery    . CARPAL TUNNEL RELEASE Left 05/11/2014   Procedure: LEFT CARPAL TUNNEL RELEASE;  Surgeon: Carole Civil, MD;  Location: [facility] ORS;  Service: Orthopedics;  Laterality: Left;  . COLONOSCOPY N/A 01/24/2015   Procedure: COLONOSCOPY;  Surgeon: Danie Binder, MD;  Location: [facility] ENDO SUITE;  Service: Endoscopy;  Laterality: N/A;  130  . CORONARY ARTERY BYPASS GRAFT     x4  . FEMORAL-POPLITEAL BYPASS GRAFT Right 10/23/2015   Procedure: BYPASS GRAFT FEMORAL TO BELOW KNEE POPLITEAL ARTERY USING RIGHT NON-REVERSED GREATER SAPPHENOUS VEIN;  Surgeon: [name], MD;  Location: [place];  Service: Vascular;  Laterality: Right;  . INTRAOPERATIVE ARTERIOGRAM Right 10/23/2015   Procedure: INTRA OPERATIVE ARTERIOGRAM - RIGHT LOWER LEG;  Surgeon: [name], MD;  Location: [place];  Service: Vascular;  Laterality: Right;  . KNEE ARTHROSCOPY Right   . LEFT AND RIGHT HEART CATHETERIZATION WITH CORONARY ANGIOGRAM N/A 09/29/2013   Procedure: LEFT AND RIGHT HEART CATHETERIZATION WITH CORONARY ANGIOGRAM;  Surgeon: Burnell Blanks, MD;  Location: [facility] CATH LAB;  Service: Cardiovascular;  Laterality: N/A;  . PERIPHERAL VASCULAR CATHETERIZATION N/A 04/21/2015   Procedure: Abdominal Aortogram w/Lower Extremity;  Surgeon: [name], MD;  Location: [place] CV LAB;  Service: Cardiovascular;  Laterality: N/A;  . Stent to right femoral  artery    . TRIGGER FINGER RELEASE Bilateral   . VEIN HARVEST Right 10/23/2015   Procedure: [place];  Surgeon: [name], MD;  Location: [place];  Service: Vascular;  Laterality: Right;        Home Medications    Prior to Admission medications   Medication Sig Start Date End Date Taking? Authorizing Provider  apixaban (ELIQUIS) 5 MG TABS tablet Take 1 tablet (5 mg total) by mouth 2 (two) times daily. 12/29/18   Arnoldo Lenis, MD  azithromycin (ZITHROMAX) 500 MG tablet Take 1 tablet (500 mg total) by mouth daily for 5 days. Take 1 tablet daily for 3 days. 01/08/19 01/13/19  Hosie Poisson, MD  Carboxymethylcellulose Sodium (EYE DROPS OP) Apply 1 drop to eye daily as needed (dry eyes).     [provider]  carvedilol (COREG) 6.25 MG tablet Take 1 tablet (6.25 mg total) by mouth 2 (two) times daily with a meal. 01/08/19   Hosie Poisson, MD  ezetimibe (ZETIA) 10 MG tablet Take 1 tablet (10 mg total) by mouth daily. 11/16/18   Arnoldo Lenis, MD  fluticasone (FLONASE) 50 MCG/ACT nasal spray Place 2 sprays into both nostrils daily. 01/08/19   Hosie Poisson, MD  guaiFENesin (MUCINEX) 600 MG 12 hr tablet Take 2 tablets (1,200 mg total) by mouth 2 (two) times daily. 01/08/19   Hosie Poisson, MD  ipratropium (ATROVENT) 0.02 % nebulizer solution Take 2.5 mLs (0.5 mg total) by nebulization every 6 (six) hours as needed for wheezing or shortness of breath. 01/08/19   Hosie Poisson, MD  levalbuterol (XOPENEX) 0.63 MG/3ML nebulizer solution Take 3 mLs (0.63 mg total) by nebulization every 4 (four) hours as needed for wheezing or shortness of breath. 01/08/19   Hosie Poisson, MD  nicotine (NICODERM CQ - DOSED IN MG/24 HOURS) 14 mg/24hr patch Place 1 patch (14 mg total) onto the skin daily. 01/08/19   Hosie Poisson, MD  potassium chloride (K-DUR,KLOR-CON) 10 MEQ tablet Take 1 tablet (10 mEq total) by mouth daily. 10/01/13   Barrett, Evelene Croon, PA-C  predniSONE  (DELTASONE) 20 MG tablet Prednisone 60 mg daily for 3 days followed by  Prednisone 40 mg daily for 5 days. 01/08/19   Hosie Poisson, MD  sacubitril-valsartan (ENTRESTO) 97-103 MG Take 1 tablet by mouth 2 (two) times daily. 11/16/18   Arnoldo Lenis, MD    Family History Family History  Problem Relation Age of Onset  . Heart attack Father   . Hyperlipidemia Father   . Diabetes Mother   . Diabetes Brother   . Colon cancer Neg Hx     Social History Social History   Tobacco Use  . Smoking status: Current Every Day Smoker    Packs/day: 1.00    Years: 60.00    Pack years: 60.00    Types: Cigars    Start date: 11/21/1949  . Smokeless tobacco: Never Used  Substance Use Topics  . Alcohol use: No  Alcohol/week: 0.0 standard drinks  . Drug use: No     Allergies   Patient has no known allergies.   Review of Systems Review of Systems  Constitutional: Negative for activity change, appetite change and fever.  HENT: Positive for congestion and rhinorrhea.   Respiratory: Positive for cough and shortness of breath.   Cardiovascular: Negative for chest pain and leg swelling.  Gastrointestinal: Negative for abdominal pain, nausea and vomiting.  Genitourinary: Negative for dysuria and hematuria.  Neurological: Positive for weakness. Negative for dizziness and headaches.     all other systems are negative except as noted in the HPI and PMH.   Physical Exam Updated Vital Signs BP (!) 149/88 (BP Location: Left Arm)   Pulse 76   Temp 98.5 F (36.9 C) (Rectal)   Resp (!) 24   Ht 5' 11.5" (1.816 m)   Wt 96.5 kg   SpO2 96%   BMI 29.26 kg/m   Physical Exam Vitals signs and nursing note reviewed.  Constitutional:      General: He is in acute distress.     Appearance: He is well-developed. He is ill-appearing.     Comments: Tachypneic, moderate respiratory distress, speaking short sentences  HENT:     Head: Normocephalic and atraumatic.     Mouth/Throat:     Pharynx: No  oropharyngeal exudate.  Eyes:     Conjunctiva/sclera: Conjunctivae normal.     Pupils: Pupils are equal, round, and reactive to light.  Neck:     Musculoskeletal: Normal range of motion and neck supple.     Comments: No meningismus. Cardiovascular:     Rate and Rhythm: Normal rate. Rhythm irregular.     Heart sounds: Normal heart sounds. No murmur.  Pulmonary:     Effort: Respiratory distress present.     Breath sounds: Wheezing present.     Comments: Diminished air exchange with inspiratory and expiratory wheezing bilaterally and scattered rhonchi Abdominal:     Palpations: Abdomen is soft.     Tenderness: There is no abdominal tenderness. There is no guarding or rebound.  Musculoskeletal: Normal range of motion.        General: No tenderness.     Comments: Bilateral lower extremities are cool to palpation and somewhat mottled. Intact PT pulse with Doppler on right, unable to find pulse on left. This is consistent with last exam in vascular surgery clinic in 2017.  Patient denies any leg pain.  Skin:    General: Skin is warm.     Capillary Refill: Capillary refill takes less than 2 seconds.  Neurological:     General: No focal deficit present.     Mental Status: He is alert and oriented to person, place, and time. Mental status is at baseline.     Cranial Nerves: No cranial nerve deficit.     Motor: No abnormal muscle tone.     Coordination: Coordination normal.     Comments:  5/5 strength throughout. CN 2-12 intact.Equal grip strength.   Psychiatric:        Behavior: Behavior normal.      ED Treatments / Results  Labs (all labs ordered are listed, but only abnormal results are displayed) Labs Reviewed  CBC WITH DIFFERENTIAL/PLATELET - Abnormal; Notable for the following components:      Result Value   MCV 102.2 (*)    MCHC 28.8 (*)    RDW 16.6 (*)    Platelets 122 (*)    All other components within normal limits  COMPREHENSIVE METABOLIC PANEL - Abnormal; Notable for  the following components:   Glucose, Bld 214 (*)    BUN 30 (*)    Creatinine, Ser 1.63 (*)    Total Bilirubin 1.5 (*)    GFR calc non Af Amer 38 (*)    GFR calc Af Amer 44 (*)    All other components within normal limits  TROPONIN I - Abnormal; Notable for the following components:   Troponin I 0.12 (*)    All other components within normal limits  BRAIN NATRIURETIC PEPTIDE - Abnormal; Notable for the following components:   B Natriuretic Peptide 2,650.0 (*)    All other components within normal limits  BLOOD GAS, ARTERIAL - Abnormal; Notable for the following components:   pH, Arterial 7.283 (*)    pCO2 arterial 52.1 (*)    pO2, Arterial 82.9 (*)    All other components within normal limits  CULTURE, BLOOD (ROUTINE X 2)  CULTURE, BLOOD (ROUTINE X 2)  LACTIC ACID, PLASMA  LACTIC ACID, PLASMA    EKG EKG Interpretation  Date/Time:  Sunday January 10 2019 00:01:09 EST Ventricular Rate:  78 PR Interval:    QRS Duration: 135 QT Interval:  414 QTC Calculation: 472 R Axis:   -45 Text Interpretation:  Atrial fibrillation RBBB and LAFB LVH with secondary repolarization abnormality No significant change was found Confirmed by Ezequiel Essex [phone]) on 01/10/2019 12:13:56 AM   Radiology Dg Chest Portable 1 View  Result Date: 01/10/2019 CLINICAL DATA:  Shortness of breath and pneumonia. EXAM: PORTABLE CHEST 1 VIEW COMPARISON:  01/05/2019 FINDINGS: Lungs are adequately inflated with worsening opacification over the medial right base and persistent hazy opacification over the left base/retrocardiac region. Cardiomediastinal silhouette and remainder of the exam is unchanged. IMPRESSION: Persistent bibasilar opacification with mild worsening in the right base. Findings likely due to worsening infection. Possible associated left effusion. Electronically Signed   By: Marin Olp M.D.   On: 01/10/2019 00:29    Procedures Procedures (including critical care time)  Medications Ordered  in ED Medications  albuterol (PROVENTIL,VENTOLIN) solution continuous neb (has no administration in time range)  ipratropium (ATROVENT) nebulizer solution 0.5 mg (has no administration in time range)  methylPREDNISolone sodium succinate (SOLU-MEDROL) 125 mg/2 mL injection 80 mg (80 mg Intravenous Given 01/10/19 0011)     Initial Impression / Assessment and Plan / ED Course  I have reviewed the triage vital signs and the nursing notes.  Pertinent labs & imaging results that were available during my care of the patient were reviewed by me and considered in my medical decision making (see chart for details).    Patient with recent diagnosis of COPD exacerbation and pneumonia presenting with worsening shortness of breath.  He is tachypneic in moderate distress on arrival with wheezing and rhonchi throughout.  Patient given nebulizers and steroids.  Chest x-ray obtained.  He denies chest pain, leg pain or leg swelling.  EKG shows rate controlled atrial fibrillation.  Chest x-ray shows worsening of right sided airspace disease.  Lactate and cultures will be obtained.  Patient was started on broad-spectrum antibiotics for suspected worsening pneumonia.  He is given a gentle fluid bolus due to dry mucous membranes and mild AKI. Echo from March '19 with EF 35-40%, moderate LVH, diffuse HK, grade 2 diastolic dysfunction, and mild LAE and TR  ABG shows metabolic and respiratory acidosis without significant CO2 retention. Mild troponin elevation noted.  Patient with no chest pain.  Suspect due to ongoing infection  and myocardial strain.  Patient does confirm DNR and DNI.  His family states his legs are always cool and mottled.  Per vascular surgery notes there has been no pulse on the left side at least since 2017.  Admission for worsening hypoxic respiratory failure likely secondary to the pneumonia discussed with Dr. Myna Hidalgo.  CHA2DS2/VAS Stroke Risk Points  Current as of 5 minutes ago     6 >= 2  Points: High Risk  1 - 1.99 Points: Medium Risk  0 Points: Low Risk    The previous score was 5 on 12/27/2018.:  Last Change:     Details    This score determines the patient's risk of having a stroke if the  patient has atrial fibrillation.       Points Metrics  1 Has Congestive Heart Failure:  Yes    Current as of 5 minutes ago  1 Has Vascular Disease:  Yes    Current as of 5 minutes ago  1 Has Hypertension:  Yes    Current as of 5 minutes ago  2 Age:  35    Current as of 5 minutes ago  1 Has Diabetes:  Yes    Current as of 5 minutes ago  0 Had Stroke:  No  Had TIA:  No  Had thromboembolism:  No    Current as of 5 minutes ago  0 Male:  No    Current as of 5 minutes ago          CRITICAL CARE Performed by: Ezequiel Essex Total critical care time: 35 minutes Critical care time was exclusive of separately billable procedures and treating other patients. Critical care was necessary to treat or prevent imminent or life-threatening deterioration. Critical care was time spent personally by me on the following activities: development of treatment plan with patient and/or surrogate as well as nursing, discussions with consultants, evaluation of patient's response to treatment, examination of patient, obtaining history from patient or surrogate, ordering and performing treatments and interventions, ordering and review of laboratory studies, ordering and review of radiographic studies, pulse oximetry and re-evaluation of patient's condition.  Final Clinical Impressions(s) / ED Diagnoses   Final diagnoses:  Acute respiratory failure with hypoxia [facility])  Community acquired pneumonia of right lower lobe of lung [facility])    ED Discharge Orders    None       Seferino Oscar, Annie Main, MD 01/10/19 [phone]

## 2019-01-10 NOTE — ED Notes (Signed)
Patient complaining of nasal irritation for dry oxygen. Pt requested Flonase. Placed humidified O2 to reduce nasal irritation per pt request. Memom, MD notified of complaint.

## 2019-01-10 NOTE — Progress Notes (Signed)
Pharmacy Antibiotic Note  Gary Odonnell is a 85 y.o. male admitted on 01/09/2019 with pneumonia.  Pharmacy has been consulted for Vancomycin dosing.  Plan: Vancomycin 1250 mg IV every 24 hours.  Goal trough 15-20 mcg/mL. Cefepime 2000 mg IV every 24 hours. Monitor labs, c/s, and vanco levels as indicated.  Height: 5' 11.5" (181.6 cm) Weight: 212 lb 11.9 oz (96.5 kg) IBW/kg (Calculated) : 76.45  Temp (24hrs), Avg:98.5 F (36.9 C), Min:98.5 F (36.9 C), Max:98.5 F (36.9 C)  Recent Labs  Lab 01/05/19 1502 01/05/19 1620 01/05/19 1859 01/05/19 2315 01/06/19 0553 01/07/19 0859 01/08/19 1030 01/10/19 0013 01/10/19 0136 01/10/19 0347  WBC 6.9  --   --   --  5.7  --   --  8.5  --   --   CREATININE 1.28*  --   --   --  1.16 1.26* 1.21 1.63*  --   --   LATICACIDVEN  --  1.0 2.0* 1.2  --   --   --   --  1.6 2.4*    Estimated Creatinine Clearance: 40.3 mL/min (A) (by C-G formula based on SCr of 1.63 mg/dL (H)).    No Known Allergies  Antimicrobials this admission: Vanco 2/16 >>  Cefepime 2/16 >>   Dose adjustments this admission: Vanco/Cefepime  Microbiology results: 2/16 BCx: pending  2/16 Sputum: pending    Thank you for allowing pharmacy to be a part of this patient's care.  Ramond Craver 01/10/2019 9:56 AM

## 2019-01-10 NOTE — ED Notes (Signed)
Family at bedside at this time requesting updates on admission diagnosis and plan of care. Dr. Roderic Palau paged and will come talk to pt and family at bedside at this time.

## 2019-01-10 NOTE — Discharge Summary (Signed)
Physician Discharge Summary  Gary Odonnell BZJ:[id number] DOB: Mar 08, 1932 DOA: 01/05/2019  PCP: Glenda Chroman, MD  Admit date: 01/05/2019 Discharge date: 01/08/2019  Admitted From: Home.  Disposition:  Home.   Recommendations for Outpatient Follow-up:  1. Follow up with PCP in 1-2 weeks 2. Please obtain BMP/CBC in one week Please follow up with a  CXR in 3 to 4 weeks.   Discharge Condition:stable.  CODE STATUS:DNR  Diet recommendation: Heart Healthy   Brief/Interim Summary: Gary Odonnell a [id number] y.o.malewith medical history significant ofrecently diagnosed A. fib during ER visit on 12/28/2019 patient was diagnosed with a pneumonia, history of left lung cancer status post radiation, coronary artery disease, hyperlipidemia, hypertension, peripheral artery disease who presents to the ED with worsening shortness of breath and non-productive persistent cough. Patient was seen in the ED on 12/27/2018 diagnosed with pneumonia and discharged on Augmentin. Patient states he completed course of Augmentin however still with ongoing nonproductive cough and shortness of breath. Hospitalist were called to admit the patient for further evaluation and management.   Discharge Diagnoses:  Principal Problem:   CAP (community acquired pneumonia) Active Problems:   HTN (hypertension)   Chronic combined systolic and diastolic CHF (congestive heart failure) (HCC)   CAD (coronary atherosclerotic disease)   S/P CABG x 4   Primary cancer of left lower lobe of lung (HCC)   PAD (peripheral artery disease) ([place])   COPD with acute exacerbation (HCC)   A-fib (HCC)   Tobacco abuse  Acute respiratory failure with hypoxia probably secondary to a combination of acute COPD exacerbation and persistent community-acquired pneumonia Patient's breathing and cough has improved and we weaned him off nasal cannula oxygen. Recommend oral antibiotics and tapering steroids, duo nebs on discharge.  Blood cultures  so far negative.   Sputum cultures show normal flora,  Urine for  strep pneumonia is negative. Influenza PCR is negative HIV PCR is negative Lactic acid level within normal limits    Newly diagnosed paroxysmal atrial fibrillation Continue with Eliquis and resume Coreg. If patient continues to be in A. fib will further need DCCV. Outpatient follow-up with cardiology.  Chronic systolic and diastolic heart failure Patient appears to be compensated at this time.   coronary artery disease status post CABG Patient currently denies any chest pain shortness of breath. Continue home medications of Coreg and Eliquis.   History of tobacco abuse Tobacco cessation counseling and nicotine patch placed.   Hypertension Blood pressure well controlled this morning.   Discharge Instructions  Discharge Instructions    Diet - low sodium heart healthy   Complete by:  As directed    Discharge instructions   Complete by:  As directed    We have changed coreg from 12.5 mg BID to 6.25 mg BID, please follow up with cardiology in one week.  Please follow up with PCP in one week and get repeat CXR in 3 to 4 weeks to evaluate for resolution.     Allergies as of 01/08/2019   No Known Allergies     Medication List    STOP taking these medications   amoxicillin-clavulanate 875-125 MG tablet Commonly known as:  AUGMENTIN     TAKE these medications   apixaban 5 MG Tabs tablet Commonly known as:  ELIQUIS Take 1 tablet (5 mg total) by mouth 2 (two) times daily.   azithromycin 500 MG tablet Commonly known as:  ZITHROMAX Take 1 tablet (500 mg total) by mouth daily for 5 days. Take 1  tablet daily for 3 days.   carvedilol 6.25 MG tablet Commonly known as:  COREG Take 1 tablet (6.25 mg total) by mouth 2 (two) times daily with a meal. What changed:    medication strength  See the new instructions.   EYE DROPS OP Apply 1 drop to eye daily as needed (dry eyes).   ezetimibe 10 MG  tablet Commonly known as:  ZETIA Take 1 tablet (10 mg total) by mouth daily.   fluticasone 50 MCG/ACT nasal spray Commonly known as:  FLONASE Place 2 sprays into both nostrils daily.   guaiFENesin 600 MG 12 hr tablet Commonly known as:  MUCINEX Take 2 tablets (1,200 mg total) by mouth 2 (two) times daily.   ipratropium 0.02 % nebulizer solution Commonly known as:  ATROVENT Take 2.5 mLs (0.5 mg total) by nebulization every 6 (six) hours as needed for wheezing or shortness of breath.   levalbuterol 0.63 MG/3ML nebulizer solution Commonly known as:  XOPENEX Take 3 mLs (0.63 mg total) by nebulization every 4 (four) hours as needed for wheezing or shortness of breath.   nicotine 14 mg/24hr patch Commonly known as:  NICODERM CQ - dosed in mg/24 hours Place 1 patch (14 mg total) onto the skin daily.   potassium chloride 10 MEQ tablet Commonly known as:  K-DUR,KLOR-CON Take 1 tablet (10 mEq total) by mouth daily.   predniSONE 20 MG tablet Commonly known as:  DELTASONE Prednisone 60 mg daily for 3 days followed by  Prednisone 40 mg daily for 5 days. What changed:  additional instructions   sacubitril-valsartan 97-103 MG Commonly known as:  ENTRESTO Take 1 tablet by mouth 2 (two) times daily.      Follow-up Information    [facility].   Specialty:  Home Health Services Why:  [facility] physical therapy Contact information: [place] [address] [phone]        Vyas, Dhruv B, MD. Schedule an appointment as soon as possible for a visit in 1 week(s).   Specialty:  Internal Medicine Contact information: [place] [address] [phone]        Arnoldo Lenis, MD .   Specialty:  Cardiology Contact information: [place] [address] [phone]          No Known Allergies  Consultations:  None.    Procedures/Studies: Dg Chest 2 View  Result Date: 01/05/2019 CLINICAL  DATA:  85 year old with acute cough and shortness of breath. Current smoker. EXAM: CHEST - 2 VIEW COMPARISON:  12/27/2018 and earlier, including CT chest 07/04/2015. FINDINGS: Prior sternotomy. Cardiac silhouette moderately enlarged, unchanged. Thoracic aorta atherosclerotic, unchanged. Hilar and mediastinal contours otherwise unremarkable. Airspace consolidation in the BILATERAL lower lobes, LEFT greater than RIGHT, increased since the chest x-ray 9 days ago. Lungs remain clear otherwise. Normal pulmonary vascularity. Stable BILATERAL pleural effusions. Remote T12 compression fracture. IMPRESSION: Worsening bilateral lower lobe pneumonia, LEFT greater than RIGHT. Stable BILATERAL pleural effusions. Electronically Signed   By: Evangeline Dakin M.D.   On: 01/05/2019 14:33   Dg Chest 2 View  Result Date: 12/27/2018 CLINICAL DATA:  Shortness of breath and cough for 1 week. History of lung cancer. EXAM: CHEST - 2 VIEW COMPARISON:  Single-view of the chest 12/27/2014. CT chest 07/04/2015. FINDINGS: There is left worse than right basilar airspace disease. Small left effusion is identified. Cardiomegaly and vascular congestion are seen. The patient is status post CABG. Aortic atherosclerosis is  noted. Remote T12 compression fracture is unchanged. IMPRESSION: Small left pleural effusion and left worse than right basilar airspace disease which could be atelectasis or pneumonia. Cardiomegaly and pulmonary vascular congestion. Atherosclerosis. Electronically Signed   By: Inge Rise M.D.   On: 12/27/2018 13:29   Dg Chest Portable 1 View  Result Date: 01/10/2019 CLINICAL DATA:  Shortness of breath and pneumonia. EXAM: PORTABLE CHEST 1 VIEW COMPARISON:  01/05/2019 FINDINGS: Lungs are adequately inflated with worsening opacification over the medial right base and persistent hazy opacification over the left base/retrocardiac region. Cardiomediastinal silhouette and remainder of the exam is unchanged. IMPRESSION:  Persistent bibasilar opacification with mild worsening in the right base. Findings likely due to worsening infection. Possible associated left effusion. Electronically Signed   By: Marin Olp M.D.   On: 01/10/2019 00:29   Dg Swallowing Func-speech Pathology  Result Date: 01/06/2019 Objective Swallowing Evaluation: Type of Study: MBS-Modified Barium Swallow Study  Patient Details Name: WAQAS BRUHL MRN: [id number] Date of Birth: [birth year] Today's Date: 01/06/2019 Time: SLP Start Time (ACUTE ONLY): 1225 -SLP Stop Time (ACUTE ONLY): 1255 SLP Time Calculation (min) (ACUTE ONLY): 30 min Past Medical History: Past Medical History: Diagnosis Date . Cancer ([place])   L lung . Coronary artery disease  . Hypercholesteremia  . Hypertension  . PAD (peripheral artery disease) ([place])  . Pneumonia 09/2013 . Radiation april 2016 . Solitary pulmonary nodule on lung CT 12/13/13   11/15/14 . Stroke [facility]) 11/2008 Past Surgical History: Past Surgical History: Procedure Laterality Date . Angioplasty to Left femoral artery   . CARPAL TUNNEL RELEASE Left 05/11/2014  Procedure: LEFT CARPAL TUNNEL RELEASE;  Surgeon: Carole Civil, MD;  Location: [facility] ORS;  Service: Orthopedics;  Laterality: Left; . COLONOSCOPY N/A 01/24/2015  Procedure: COLONOSCOPY;  Surgeon: Danie Binder, MD;  Location: [facility] ENDO SUITE;  Service: Endoscopy;  Laterality: N/A;  130 . CORONARY ARTERY BYPASS GRAFT    x4 . FEMORAL-POPLITEAL BYPASS GRAFT Right 10/23/2015  Procedure: BYPASS GRAFT FEMORAL TO BELOW KNEE POPLITEAL ARTERY USING RIGHT NON-REVERSED GREATER SAPPHENOUS VEIN;  Surgeon: [name], MD;  Location: [place];  Service: Vascular;  Laterality: Right; . INTRAOPERATIVE ARTERIOGRAM Right 10/23/2015  Procedure: INTRA OPERATIVE ARTERIOGRAM - RIGHT LOWER LEG;  Surgeon: [name], MD;  Location: [place];  Service: Vascular;  Laterality: Right; . KNEE ARTHROSCOPY Right  . LEFT AND RIGHT HEART CATHETERIZATION WITH CORONARY ANGIOGRAM N/A 09/29/2013  Procedure:  LEFT AND RIGHT HEART CATHETERIZATION WITH CORONARY ANGIOGRAM;  Surgeon: Burnell Blanks, MD;  Location: [facility] CATH LAB;  Service: Cardiovascular;  Laterality: N/A; . PERIPHERAL VASCULAR CATHETERIZATION N/A 04/21/2015  Procedure: Abdominal Aortogram w/Lower Extremity;  Surgeon: [name], MD;  Location: [place] CV LAB;  Service: Cardiovascular;  Laterality: N/A; . Stent to right femoral artery   . TRIGGER FINGER RELEASE Bilateral  . VEIN HARVEST Right 10/23/2015  Procedure: [place];  Surgeon: [name], MD;  Location: [place];  Service: Vascular;  Laterality: Right; HPI: 85 yo male adm [facility] with CAP.  Pt with PMH + for left frontal lobe infarct 2014 after having left hand and facial numbness, CVA, lung cancer s/p XRT.  Swallow evaluation ordered. Pt CXR showed worsening bilateral lobe pna (left more than right).  He is a smoker.   Subjective: pt awake in chair Assessment / Plan / Recommendation CHL IP CLINICAL IMPRESSIONS 01/06/2019 Clinical Impression Patient presents with overall functional oropharyngeal swallow ability.  His ability to  masticate is mildly compromised given his lack of upper dentures.  Pharyngeal swallow is strong without significant residuals.  Laryngeal penetration of thin liquid observed that is [facility] given pt's age.  Pt even tested with sequential swallows of liquids with good airway protection.  Note pt conducting extended breath hold when swallowing liquids to help protect airway with residual clearance.  Of note, barium tablet momentarily lodged at pyriform sinus which pt immediately cleared with 2nd swallow of liquid.  He was NOT sensate to pill lodging.  Pt's symptoms were not demonstrated during MBS.   SLP Visit Diagnosis Dysphagia, unspecified (R13.10) Attention and concentration deficit following -- Frontal lobe and executive function deficit following -- Impact on safety and function Mild aspiration risk   CHL IP TREATMENT  RECOMMENDATION 01/06/2019 Treatment Recommendations Defer until completion of intrumental exam   Prognosis 01/06/2019 Prognosis for Safe Diet Advancement Guarded Barriers to Reach Goals Severity of deficits;Time post onset Barriers/Prognosis Comment -- CHL IP DIET RECOMMENDATION 01/06/2019 SLP Diet Recommendations Thin liquid;Regular solids Liquid Administration via Cup Medication Administration Whole meds with liquid Compensations Slow rate;Small sips/bites Postural Changes Remain semi-upright after after feeds/meals (Comment);Seated upright at 90 degrees   CHL IP OTHER RECOMMENDATIONS 01/06/2019 Recommended Consults -- Oral Care Recommendations Oral care BID Other Recommendations --   CHL IP FOLLOW UP RECOMMENDATIONS 01/06/2019 Follow up Recommendations None   No flowsheet data found.     CHL IP ORAL PHASE 01/06/2019 Oral Phase WFL Oral - Pudding Teaspoon -- Oral - Pudding Cup -- Oral - Honey Teaspoon -- Oral - Honey Cup -- Oral - Nectar Teaspoon -- Oral - Nectar Cup WFL Oral - Nectar Straw -- Oral - Thin Teaspoon -- Oral - Thin Cup WFL Oral - Thin Straw WFL Oral - Puree WFL Oral - Mech Soft WFL Oral - Regular -- Oral - Multi-Consistency WFL Oral - Pill WFL Oral Phase - Comment --  CHL IP PHARYNGEAL PHASE 01/06/2019 Pharyngeal Phase Impaired Pharyngeal- Pudding Teaspoon -- Pharyngeal -- Pharyngeal- Pudding Cup -- Pharyngeal -- Pharyngeal- Honey Teaspoon -- Pharyngeal -- Pharyngeal- Honey Cup -- Pharyngeal -- Pharyngeal- Nectar Teaspoon -- Pharyngeal -- Pharyngeal- Nectar Cup WFL Pharyngeal -- Pharyngeal- Nectar Straw -- Pharyngeal -- Pharyngeal- Thin Teaspoon -- Pharyngeal -- Pharyngeal- Thin Cup WFL;Penetration/Aspiration during swallow Pharyngeal Material enters airway, remains ABOVE vocal cords and not ejected out Pharyngeal- Thin Straw WFL;Penetration/Aspiration during swallow Pharyngeal Material enters airway, remains ABOVE vocal cords and not ejected out Pharyngeal- Puree WFL Pharyngeal -- Pharyngeal- Mechanical  Soft WFL Pharyngeal -- Pharyngeal- Regular -- Pharyngeal -- Pharyngeal- Multi-consistency WFL Pharyngeal -- Pharyngeal- Pill Pharyngeal residue - pyriform Pharyngeal -- Pharyngeal Comment pill lodged at pyriform sinus momentarily without pt awareness- liquid swallow facililtated clearance; pt does an extended breath hold with double swallows when consuming liquids - suspect self created compensation strategy  CHL IP CERVICAL ESOPHAGEAL PHASE 01/06/2019 Cervical Esophageal Phase WFL Pudding Teaspoon -- Pudding Cup -- Honey Teaspoon -- Honey Cup -- Nectar Teaspoon -- Nectar Cup -- Nectar Straw -- Thin Teaspoon -- Thin Cup -- Thin Straw -- Puree -- Mechanical Soft -- Regular -- Multi-consistency -- Pill -- Cervical Esophageal Comment -- Macario Golds 01/06/2019, 12:13 PM  Luanna Salk, MS [facility] SLP Acute Rehab Services Pager [phone] Office [phone]                 Subjective: Breathing better. No chest pain or sob. Weaned off oxygen.   Discharge Exam: Vitals:   01/07/19 2037 01/08/19 0417  BP: (!) 157/77 Marland Kitchen)  161/78  Pulse: 67 67  Resp: 16 16  Temp: 97.7 F (36.5 C) 97.7 F (36.5 C)  SpO2: 98% 99%   Vitals:   01/07/19 1440 01/07/19 2013 01/07/19 2037 01/08/19 0417  BP: (!) 152/86  (!) 157/77 (!) 161/78  Pulse: 69  67 67  Resp: 18  16 16   Temp: (!) 97.5 F (36.4 C)  97.7 F (36.5 C) 97.7 F (36.5 C)  TempSrc: Oral  Oral Oral  SpO2: 100% 97% 98% 99%  Weight:    96.5 kg  Height:        General: Pt is alert, awake, not in acute distress Cardiovascular: RRR, S1/S2 +, no rubs, no gallops Respiratory: CTA bilaterally, no wheezing, no rhonchi Abdominal: Soft, NT, ND, bowel sounds + Extremities: no edema, no cyanosis    The results of significant diagnostics from this hospitalization (including imaging, microbiology, ancillary and laboratory) are listed below for reference.     Microbiology: Recent Results (from the past 240 hour(s))  Blood Culture (routine x 2)      Status: None   Collection Time: 01/05/19  2:47 PM  Result Value Ref Range Status   Specimen Description   Final    BLOOD LEFT ANTECUBITAL Performed at [facility] [address] [place], [place] [address]    Special Requests   Final    Blood Culture adequate volume BOTTLES DRAWN AEROBIC AND ANAEROBIC   Culture   Final    NO GROWTH 5 DAYS Performed at [facility], [place] [address], [place], [place] [address]    Report Status 01/10/2019 FINAL  Final  Blood Culture (routine x 2)     Status: None   Collection Time: 01/05/19  3:07 PM  Result Value Ref Range Status   Specimen Description   Final    BLOOD RIGHT ANTECUBITAL Performed at [facility] [address], [place], [place] [address]    Special Requests   Final    BOTTLES DRAWN AEROBIC AND ANAEROBIC Blood Culture adequate volume Performed at [facility] [address], [place], [place] [address]    Culture   Final    NO GROWTH 5 DAYS Performed at [facility], [place] [address] [place], [place] [address]    Report Status 01/10/2019 FINAL  Final  Culture, sputum-assessment     Status: None   Collection Time: 01/05/19  5:31 PM  Result Value Ref Range Status   Specimen Description EXPECTORATED SPUTUM  Final   Special Requests NONE  Final   Sputum evaluation   Final    THIS SPECIMEN IS ACCEPTABLE FOR SPUTUM CULTURE Performed at [facility], [place] [address], [place], [place] [address]    Report Status 01/06/2019 FINAL  Final  Culture, respiratory     Status: None   Collection Time: 01/05/19  5:31 PM  Result Value Ref Range Status   Specimen Description   Final    EXPECTORATED SPUTUM Performed at [facility] [address], [place], [place] [address]    Special Requests   Final    NONE Reflexed from [id number] Performed at [facility], [place] [address], [place], [place] [address]    Gram Stain    Final    NO WBC SEEN RARE SQUAMOUS EPITHELIAL CELLS PRESENT FEW GRAM POSITIVE COCCI IN CLUSTERS FEW GRAM POSITIVE RODS RARE GRAM NEGATIVE RODS    Culture   Final    MODERATE Consistent with  normal respiratory flora. Performed at [facility], [place] [address], [place], [place] [address]    Report Status 01/08/2019 FINAL  Final  MRSA PCR Screening     Status: Abnormal   Collection Time: 01/05/19  6:52 PM  Result Value Ref Range Status   MRSA by PCR POSITIVE (A) NEGATIVE Final    Comment:        The GeneXpert MRSA Assay (FDA approved for NASAL specimens only), is one component of a comprehensive MRSA colonization surveillance program. It is not intended to diagnose MRSA infection nor to guide or monitor treatment for MRSA infections. RESULT CALLED TO, READ BACK BY AND VERIFIED WITH: C.ADDISON AT 2145 ON 01/05/19 BY N.THOMPSON Performed at [facility], [place] [address], [place], [place] [address]   Culture, Urine     Status: None   Collection Time: 01/06/19 12:30 AM  Result Value Ref Range Status   Specimen Description   Final    URINE, CLEAN CATCH Performed at [facility], [place] [address], [place], [place] [address]    Special Requests   Final    NONE Performed at [facility], [place] [address], [place], [place] [address]    Culture   Final    NO GROWTH Performed at [facility], [place] [address] [place], [place] [address]    Report Status 01/07/2019 FINAL  Final  Blood culture (routine x 2)     Status: None (Preliminary result)   Collection Time: 01/10/19  1:09 AM  Result Value Ref Range Status   Specimen Description BLOOD RIGHT FOREARM  Final   Special Requests   Final    BOTTLES DRAWN AEROBIC AND ANAEROBIC Blood Culture adequate volume   Culture   Final    NO GROWTH < 12 HOURS Performed at [facility], [address], [place], [place] [address]    Report Status PENDING  Incomplete  Blood culture  (routine x 2)     Status: None (Preliminary result)   Collection Time: 01/10/19  1:36 AM  Result Value Ref Range Status   Specimen Description LEFT ANTECUBITAL  Final   Special Requests   Final    BOTTLES DRAWN AEROBIC AND ANAEROBIC Blood Culture adequate volume   Culture   Final    NO GROWTH < 12 HOURS Performed at [facility], [address], [place], [place] [address]    Report Status PENDING  Incomplete     Labs: BNP (last 3 results) Recent Labs    01/10/19 0013  BNP 4,709.6*   Basic Metabolic Panel: Recent Labs  Lab 01/05/19 1502 01/05/19 2011 01/06/19 0553 01/07/19 0859 01/08/19 1030 01/10/19 0013  NA 144  --  141 140 137 140  K 4.4  --  4.6 4.4 4.8 4.8  CL 105  --  108 108 107 107  CO2 31  --  22 25 24 27   GLUCOSE 157*  --  261* 232* 275* 214*  BUN 26*  --  25* 28* 25* 30*  CREATININE 1.28*  --  1.16 1.26* 1.21 1.63*  CALCIUM 9.7  --  9.1 9.4 9.1 9.3  MG  --  2.2  --   --   --   --    Liver Function Tests: Recent Labs  Lab 01/05/19 1502 01/10/19 0013  AST 21 23  ALT 46* 43  ALKPHOS 64 56  BILITOT 1.9* 1.5*  PROT 7.1 6.8  ALBUMIN 4.0 3.6   No results  for input(s): LIPASE, AMYLASE in the last 168 hours. No results for input(s): AMMONIA in the last 168 hours. CBC: Recent Labs  Lab 01/05/19 1502 01/06/19 0553 01/10/19 0013  WBC 6.9 5.7 8.5  NEUTROABS 5.3  --  6.6  HGB 14.1 12.3* 13.4  HCT 48.5 42.7 46.5  MCV 99.6 103.6* 102.2*  PLT 178 117* 122*   Cardiac Enzymes: Recent Labs  Lab 01/10/19 0013 01/10/19 0350 01/10/19 1325  TROPONINI 0.12* 0.09* 0.07*   BNP: Invalid input(s): POCBNP CBG: Recent Labs  Lab 01/07/19 0804 01/08/19 0727 01/10/19 0751 01/10/19 0926 01/10/19 1204  GLUCAP 224* 213* 201* 199* 237*   D-Dimer No results for input(s): DDIMER in the last 72 hours. Hgb A1c No results for input(s): HGBA1C in the last 72 hours. Lipid Profile No results for input(s): CHOL, HDL, LDLCALC, TRIG, CHOLHDL, LDLDIRECT in the last  72 hours. Thyroid function studies No results for input(s): TSH, T4TOTAL, T3FREE, THYROIDAB in the last 72 hours.  Invalid input(s): FREET3 Anemia work up No results for input(s): VITAMINB12, FOLATE, FERRITIN, TIBC, IRON, RETICCTPCT in the last 72 hours. Urinalysis    Component Value Date/Time   COLORURINE YELLOW 01/10/2019 0148   APPEARANCEUR CLEAR 01/10/2019 0148   LABSPEC 1.024 01/10/2019 0148   PHURINE 5.0 01/10/2019 0148   GLUCOSEU 50 (A) 01/10/2019 0148   HGBUR SMALL (A) 01/10/2019 0148   BILIRUBINUR NEGATIVE 01/10/2019 0148   KETONESUR NEGATIVE 01/10/2019 0148   PROTEINUR 100 (A) 01/10/2019 0148   NITRITE NEGATIVE 01/10/2019 0148   LEUKOCYTESUR NEGATIVE 01/10/2019 0148   Sepsis Labs Invalid input(s): PROCALCITONIN,  WBC,  LACTICIDVEN Microbiology Recent Results (from the past 240 hour(s))  Blood Culture (routine x 2)     Status: None   Collection Time: 01/05/19  2:47 PM  Result Value Ref Range Status   Specimen Description   Final    BLOOD LEFT ANTECUBITAL Performed at [facility], [place] [address] [place], [place] [address]    Special Requests   Final    Blood Culture adequate volume BOTTLES DRAWN AEROBIC AND ANAEROBIC   Culture   Final    NO GROWTH 5 DAYS Performed at [facility], [place] [address], [place], [place] [address]    Report Status 01/10/2019 FINAL  Final  Blood Culture (routine x 2)     Status: None   Collection Time: 01/05/19  3:07 PM  Result Value Ref Range Status   Specimen Description   Final    BLOOD RIGHT ANTECUBITAL Performed at [facility] [address], [place], [place] [address]    Special Requests   Final    BOTTLES DRAWN AEROBIC AND ANAEROBIC Blood Culture adequate volume Performed at [facility] [address] [place], [place] [address]    Culture   Final    NO GROWTH 5 DAYS Performed at [facility], [place] [address], [place], [place] [address]    Report  Status 01/10/2019 FINAL  Final  Culture, sputum-assessment     Status: None   Collection Time: 01/05/19  5:31 PM  Result Value Ref Range Status   Specimen Description EXPECTORATED SPUTUM  Final   Special Requests NONE  Final   Sputum evaluation   Final    THIS SPECIMEN IS ACCEPTABLE FOR SPUTUM CULTURE Performed at [facility], [place] [address], [place],  [address]    Report Status 01/06/2019 FINAL  Final  Culture, respiratory     Status: None  Collection Time: 01/05/19  5:31 PM  Result Value Ref Range Status   Specimen Description   Final    EXPECTORATED SPUTUM Performed at [facility] [address], [place], [place] [address]    Special Requests   Final    NONE Reflexed from [id number] Performed at [facility], [place] [address] [place], [place] [address]    Gram Stain   Final    NO WBC SEEN RARE SQUAMOUS EPITHELIAL CELLS PRESENT FEW GRAM POSITIVE COCCI IN CLUSTERS FEW GRAM POSITIVE RODS RARE GRAM NEGATIVE RODS    Culture   Final    MODERATE Consistent with normal respiratory flora. Performed at [facility], [place] [address], [place], [place] [address]    Report Status 01/08/2019 FINAL  Final  MRSA PCR Screening     Status: Abnormal   Collection Time: 01/05/19  6:52 PM  Result Value Ref Range Status   MRSA by PCR POSITIVE (A) NEGATIVE Final    Comment:        The GeneXpert MRSA Assay (FDA approved for NASAL specimens only), is one component of a comprehensive MRSA colonization surveillance program. It is not intended to diagnose MRSA infection nor to guide or monitor treatment for MRSA infections. RESULT CALLED TO, READ BACK BY AND VERIFIED WITH: C.ADDISON AT 2145 ON 01/05/19 BY N.THOMPSON Performed at [facility], [place] [address] [place], [place] [address]   Culture, Urine     Status: None   Collection Time: 01/06/19 12:30 AM  Result Value Ref Range Status   Specimen  Description   Final    URINE, CLEAN CATCH Performed at [facility], [place] [address], [place], [place] [address]    Special Requests   Final    NONE Performed at [facility], [place] [address] [place], [place] [address]    Culture   Final    NO GROWTH Performed at [facility], [place] [address] [place], [place] [address]    Report Status 01/07/2019 FINAL  Final  Blood culture (routine x 2)     Status: None (Preliminary result)   Collection Time: 01/10/19  1:09 AM  Result Value Ref Range Status   Specimen Description BLOOD RIGHT FOREARM  Final   Special Requests   Final    BOTTLES DRAWN AEROBIC AND ANAEROBIC Blood Culture adequate volume   Culture   Final    NO GROWTH < 12 HOURS Performed at [facility], [address], [place],  [address]    Report Status PENDING  Incomplete  Blood culture (routine x 2)     Status: None (Preliminary result)   Collection Time: 01/10/19  1:36 AM  Result Value Ref Range Status   Specimen Description LEFT ANTECUBITAL  Final   Special Requests   Final    BOTTLES DRAWN AEROBIC AND ANAEROBIC Blood Culture adequate volume   Culture   Final    NO GROWTH < 12 HOURS Performed at [facility], [address] [place],  [address]    Report Status PENDING  Incomplete     Time coordinating discharge: 32 minutes  SIGNED:   Hosie Poisson, MD  [facility] 01/10/2019, 4:14 PM Pager   If 7PM-7AM, please contact night-coverage [email or web address] Password [email or web address]

## 2019-01-10 NOTE — ED Notes (Signed)
Date and time results received: 01/10/19 0444   Test: Lactic Critical Value: 2.4 Name of Provider Notified: Rancour   Orders Received? Or Actions Taken?: n/a

## 2019-01-10 NOTE — ED Notes (Signed)
Date and time results received: 01/10/19 0049   Test: Troponin Critical Value: 0.12  Name of Provider Notified: Rancour  Orders Received? Or Actions Taken?: n/a

## 2019-01-10 NOTE — Progress Notes (Signed)
ANTIBIOTIC CONSULT NOTE-Preliminary  Pharmacy Consult for vancomycin Indication: pneumonia  No Known Allergies  Patient Measurements: Height: 5' 11.5" (181.6 cm) Weight: 212 lb 11.9 oz (96.5 kg) IBW/kg (Calculated) : 76.45 Adjusted Body Weight:   Vital Signs: Temp: 98.5 F (36.9 C) (02/16 0002) Temp Source: Rectal (02/16 0002) BP: 149/88 (02/16 0002) Pulse Rate: 75 (02/16 0025)  Labs: Recent Labs    01/07/19 0859 01/08/19 1030 01/10/19 0013  WBC  --   --  8.5  HGB  --   --  13.4  PLT  --   --  122*  CREATININE 1.26* 1.21 1.63*    Estimated Creatinine Clearance: 40.3 mL/min (A) (by C-G formula based on SCr of 1.63 mg/dL (H)).  No results for input(s): VANCOTROUGH, VANCOPEAK, VANCORANDOM, GENTTROUGH, GENTPEAK, GENTRANDOM, TOBRATROUGH, TOBRAPEAK, TOBRARND, AMIKACINPEAK, AMIKACINTROU, AMIKACIN in the last 72 hours.   Microbiology: Recent Results (from the past 720 hour(s))  Blood Culture (routine x 2)     Status: None (Preliminary result)   Collection Time: 01/05/19  2:47 PM  Result Value Ref Range Status   Specimen Description   Final    BLOOD LEFT ANTECUBITAL Performed at [place] [address], [place], [place] [address]    Special Requests   Final    Blood Culture adequate volume BOTTLES DRAWN AEROBIC AND ANAEROBIC   Culture   Final    NO GROWTH 4 DAYS Performed at [facility], [place] [address], [place], [place] [address]    Report Status PENDING  Incomplete  Blood Culture (routine x 2)     Status: None (Preliminary result)   Collection Time: 01/05/19  3:07 PM  Result Value Ref Range Status   Specimen Description   Final    BLOOD RIGHT ANTECUBITAL Performed at [place] [address], [place], [place] [address]    Special Requests   Final    BOTTLES DRAWN AEROBIC AND ANAEROBIC Blood Culture adequate volume Performed at [place] [address] [place], [place] [address]     Culture   Final    NO GROWTH 4 DAYS Performed at [facility], [place] [address], [place], [place] [address]    Report Status PENDING  Incomplete  Culture, sputum-assessment     Status: None   Collection Time: 01/05/19  5:31 PM  Result Value Ref Range Status   Specimen Description EXPECTORATED SPUTUM  Final   Special Requests NONE  Final   Sputum evaluation   Final    THIS SPECIMEN IS ACCEPTABLE FOR SPUTUM CULTURE Performed at [facility], [place] [address] [place],  [address]    Report Status 01/06/2019 FINAL  Final  Culture, respiratory     Status: None   Collection Time: 01/05/19  5:31 PM  Result Value Ref Range Status   Specimen Description   Final    EXPECTORATED SPUTUM Performed at [place] [address] [place],  [address]    Special Requests   Final    NONE Reflexed from [phone] Performed at [facility], [place] [address], [place], [place] [address]    Gram Stain   Final    NO WBC SEEN RARE SQUAMOUS EPITHELIAL CELLS PRESENT FEW GRAM POSITIVE COCCI IN CLUSTERS FEW GRAM POSITIVE RODS RARE GRAM NEGATIVE RODS    Culture   Final    MODERATE Consistent with normal respiratory flora. Performed at [facility], [place] [address],  [place], [place] [address]    Report Status 01/08/2019 FINAL  Final  MRSA PCR Screening     Status: Abnormal   Collection Time: 01/05/19  6:52 PM  Result Value Ref Range Status   MRSA by PCR POSITIVE (A) NEGATIVE Final    Comment:        The GeneXpert MRSA Assay (FDA approved for NASAL specimens only), is one component of a comprehensive MRSA colonization surveillance program. It is not intended to diagnose MRSA infection nor to guide or monitor treatment for MRSA infections. RESULT CALLED TO, READ BACK BY AND VERIFIED WITH: C.ADDISON AT 2145 ON 01/05/19 BY N.THOMPSON Performed at [facility], [place] [address] [place], [place] [address]    Culture, Urine     Status: None   Collection Time: 01/06/19 12:30 AM  Result Value Ref Range Status   Specimen Description   Final    URINE, CLEAN CATCH Performed at [facility], [place] [address], [place], [place] [address]    Special Requests   Final    NONE Performed at [facility], [place] [address], [place], [place] [address]    Culture   Final    NO GROWTH Performed at [facility], [place] [address] [place], [place] [address]    Report Status 01/07/2019 FINAL  Final    Medical History: Past Medical History:  Diagnosis Date  . Cancer ([place])    L lung  . Coronary artery disease   . Hypercholesteremia   . Hypertension   . PAD (peripheral artery disease) ([place])   . Pneumonia 09/2013  . Radiation april 2016  . Solitary pulmonary nodule on lung CT 12/13/13   11/15/14  . Stroke [facility]) 11/2008    Medications:  Scheduled:   Infusions:  . ceFEPime (MAXIPIME) IV    . vancomycin     Anti-infectives (From admission, onward)   Start     Dose/Rate Route Frequency Ordered Stop   01/10/19 0100  vancomycin (VANCOCIN) IVPB 1000 mg/200 mL premix     1,000 mg 200 mL/hr over 60 Minutes Intravenous  Once 01/10/19 0048     01/10/19 0045  ceFEPIme (MAXIPIME) 1 g in sodium chloride 0.9 % 100 mL IVPB     1 g 200 mL/hr over 30 Minutes Intravenous  Once 01/10/19 9470        Assessment: 85 yo male recently discharged from [facility] where he was being treated for COPD exacerbation. Presented with SOB, now starting vancomycin for PNA.   Goal of Therapy:  Vancomycin trough level 15-20 mcg/ml  Plan:  Preliminary review of pertinent patient information completed.  Protocol will be initiated with dose(s) of vancomycin 1 gram x 1.  Forestine Na clinical pharmacist will complete review during morning rounds to assess patient and finalize treatment regimen if needed.  Yiannis Tulloch Scarlett, RPH 01/10/2019,12:50 AM

## 2019-01-10 NOTE — ED Notes (Signed)
Patient is non compliant with oxygen use. Upon entrance into room, patient has oxygen off. I have placed nasal cannula back into patient's nose 5 times.

## 2019-01-10 NOTE — H&P (Signed)
History and Physical    Gary Odonnell GDJ:[id number] DOB: 25-Jun-1932 DOA: 01/09/2019  PCP: Glenda Chroman, MD   Patient coming from: Home   Chief Complaint: Increased SOB and cough, wheezing   HPI: Gary Odonnell is a 85 y.o. male with medical history significant for coronary artery disease, type 2 diabetes mellitus, hypertension, atrial fibrillation on Eliquis, COPD, and chronic combined systolic and diastolic CHF, now presenting to the emergency department for evaluation of increasing shortness of breath, cough, and wheezing.  Patient was just discharged from the hospital after treatment for pneumonia with Rocephin and azithromycin.  Patient developed a productive cough and increased shortness of breath approximately 2 weeks ago, was started on Augmentin in the outpatient setting, continued to worsen and developed significant wheezing, and was admitted to the hospital on 01/05/2019, started on Rocephin and azithromycin, and was discharged with oxygen on 01/08/2019 in improved condition.  Unfortunately, over the past 24 hours or so, his shortness of breath has worsened again and he has begun to wheeze again as well.  He denies any lower extremity edema, denies any chest pain, and has not noted any fevers or chills.  ED Course: Upon arrival to the ED, patient is found to be afebrile, saturating mid 90s on room air, mildly tachypneic, and with vitals otherwise stable.  EKG features atrial fibrillation with RBBB, LAFB, and LVH with repolarization abnormality.  Chest x-ray is notable for persistent bibasilar opacification with mild worsening in the right base that likely reflects worsening infection.  Chemistry panel is notable for a glucose of 214, total bilirubin 1.5, and creatinine 1.63, up from 1.21 two days earlier.  CBC is notable for mild thrombocytopenia and macrocytosis without anemia.  Troponin is elevated to 0.12 and BNP is elevated to 2650.  Blood cultures were collected and the patient was  treated with IV Solu-Medrol, albuterol, Atrovent, 500 cc normal saline bolus, vancomycin, and cefepime.  He remains hemodynamically stable and will be observed for further evaluation and management.  Review of Systems:  All other systems reviewed and apart from HPI, are negative.  Past Medical History:  Diagnosis Date  . Cancer ([place])    L lung  . Coronary artery disease   . Hypercholesteremia   . Hypertension   . PAD (peripheral artery disease) ([place])   . Pneumonia 09/2013  . Radiation april 2016  . Solitary pulmonary nodule on lung CT 12/13/13   11/15/14  . Stroke [facility]) 11/2008    Past Surgical History:  Procedure Laterality Date  . Angioplasty to Left femoral artery    . CARPAL TUNNEL RELEASE Left 05/11/2014   Procedure: LEFT CARPAL TUNNEL RELEASE;  Surgeon: Carole Civil, MD;  Location: [facility];  Service: Orthopedics;  Laterality: Left;  . COLONOSCOPY N/A 01/24/2015   Procedure: COLONOSCOPY;  Surgeon: Danie Binder, MD;  Location: [facility];  Service: Endoscopy;  Laterality: N/A;  130  . CORONARY ARTERY BYPASS GRAFT     x4  . FEMORAL-POPLITEAL BYPASS GRAFT Right 10/23/2015   Procedure: BYPASS GRAFT FEMORAL TO BELOW KNEE POPLITEAL ARTERY USING RIGHT NON-REVERSED GREATER SAPPHENOUS VEIN;  Surgeon: [name], MD;  Location: [facility];  Service: Vascular;  Laterality: Right;  . INTRAOPERATIVE ARTERIOGRAM Right 10/23/2015   Procedure: INTRA OPERATIVE ARTERIOGRAM - RIGHT LOWER LEG;  Surgeon: [name], MD;  Location: [facility];  Service: Vascular;  Laterality: Right;  . KNEE ARTHROSCOPY Right   . LEFT AND RIGHT HEART CATHETERIZATION WITH CORONARY ANGIOGRAM N/A  09/29/2013   Procedure: LEFT AND RIGHT HEART CATHETERIZATION WITH CORONARY ANGIOGRAM;  Surgeon: Burnell Blanks, MD;  Location: [facility] C[facility] LAB;  Service: Cardiovascular;  Laterality: N/A;  . PERIPHERAL VASCULAR CATHETERIZATION N/A 04/21/2015   Procedure: Abdominal Aortogram w/Lower Extremity;  Surgeon: [name], MD;  Location: [facility] CV LAB;  Service: Cardiovascular;  Laterality: N/A;  . Stent to right femoral artery    . TRIGGER FINGER RELEASE Bilateral   . VEIN HARVEST Right 10/23/2015   Procedure: VEIN HARVEST - RIGHT GREATER SAPPHENOUS;  Surgeon: [name], MD;  Location: [facility];  Service: Vascular;  Laterality: Right;     reports that he has been smoking cigars. He started smoking about 69 years ago. He has a 60.00 pack-year smoking history. He has never used smokeless tobacco. He reports that he does not drink alcohol or use drugs.  No Known Allergies  Family History  Problem Relation Age of Onset  . Heart attack Father   . Hyperlipidemia Father   . Diabetes Mother   . Diabetes Brother   . Colon cancer Neg Hx      Prior to Admission medications   Medication Sig Start Date End Date Taking? Authorizing Provider  apixaban (ELIQUIS) 5 MG TABS tablet Take 1 tablet (5 mg total) by mouth 2 (two) times daily. 12/29/18   Arnoldo Lenis, MD  azithromycin (ZITHROMAX) 500 MG tablet Take 1 tablet (500 mg total) by mouth daily for 5 days. Take 1 tablet daily for 3 days. 01/08/19 01/13/19  Hosie Poisson, MD  Carboxymethylcellulose Sodium (EYE DROPS OP) Apply 1 drop to eye daily as needed (dry eyes).     [provider]  carvedilol (COREG) 6.25 MG tablet Take 1 tablet (6.25 mg total) by mouth 2 (two) times daily with a meal. 01/08/19   Hosie Poisson, MD  ezetimibe (ZETIA) 10 MG tablet Take 1 tablet (10 mg total) by mouth daily. 11/16/18   Arnoldo Lenis, MD  fluticasone (FLONASE) 50 MCG/ACT nasal spray Place 2 sprays into both nostrils daily. 01/08/19   Hosie Poisson, MD  guaiFENesin (MUCINEX) 600 MG 12 hr tablet Take 2 tablets (1,200 mg total) by mouth 2 (two) times daily. 01/08/19   Hosie Poisson, MD  ipratropium (ATROVENT) 0.02 % nebulizer solution Take 2.5 mLs (0.5 mg total) by nebulization every 6 (six) hours as needed for wheezing or shortness of breath. 01/08/19    Hosie Poisson, MD  levalbuterol (XOPENEX) 0.63 MG/3ML nebulizer solution Take 3 mLs (0.63 mg total) by nebulization every 4 (four) hours as needed for wheezing or shortness of breath. 01/08/19   Hosie Poisson, MD  nicotine (NICODERM CQ - DOSED IN MG/24 HOURS) 14 mg/24hr patch Place 1 patch (14 mg total) onto the skin daily. 01/08/19   Hosie Poisson, MD  potassium chloride (K-DUR,KLOR-CON) 10 MEQ tablet Take 1 tablet (10 mEq total) by mouth daily. 10/01/13   Barrett, Evelene Croon, PA-C  predniSONE (DELTASONE) 20 MG tablet Prednisone 60 mg daily for 3 days followed by  Prednisone 40 mg daily for 5 days. 01/08/19   Hosie Poisson, MD  sacubitril-valsartan (ENTRESTO) 97-103 MG Take 1 tablet by mouth 2 (two) times daily. 11/16/18   Arnoldo Lenis, MD    Physical Exam: Vitals:   01/10/19 0002 01/10/19 0023 01/10/19 0025 01/10/19 0039  BP: (!) 149/88   (!) 141/74  Pulse: 76  75 74  Resp: (!) 24  (!) 21 (!) 25  Temp: 98.5 F (36.9 C)  TempSrc: Rectal     SpO2: 96% 97% 97% 100%  Weight:      Height:        Constitutional: NAD, calm  Eyes: PERTLA, lids and conjunctivae normal ENMT: Mucous membranes are moist. Posterior pharynx clear of any exudate or lesions.   Neck: normal, supple, no masses, no thyromegaly Respiratory: Mild dyspnea with speech, mild tachypnea. Coarse rhonchi bilaterally. No accessory muscle use.  Cardiovascular: Rate ~80 and regular. No extremity edema.   Abdomen: No distension, no tenderness, soft. Bowel sounds active.   Musculoskeletal: no clubbing / cyanosis. No joint deformity upper and lower extremities.    Skin: no significant rashes, lesions, ulcers. Dry, well-perfused. Neurologic: No facial asymmetry. Sensation intact. Moving all extremities.  Psychiatric: Alert and oriented x 3. Pleasant and cooperative.    Labs on Admission: I have personally reviewed following labs and imaging studies  CBC: Recent Labs  Lab 01/05/19 1502 01/06/19 0553 01/10/19 0013  WBC  6.9 5.7 8.5  NEUTROABS 5.3  --  6.6  HGB 14.1 12.3* 13.4  HCT 48.5 42.7 46.5  MCV 99.6 103.6* 102.2*  PLT 178 117* 462*   Basic Metabolic Panel: Recent Labs  Lab 01/05/19 1502 01/05/19 2011 01/06/19 0553 01/07/19 0859 01/08/19 1030 01/10/19 0013  NA 144  --  141 140 137 140  K 4.4  --  4.6 4.4 4.8 4.8  CL 105  --  108 108 107 107  CO2 31  --  22 25 24 27   GLUCOSE 157*  --  261* 232* 275* 214*  BUN 26*  --  25* 28* 25* 30*  CREATININE 1.28*  --  1.16 1.26* 1.21 1.63*  CALCIUM 9.7  --  9.1 9.4 9.1 9.3  MG  --  2.2  --   --   --   --    GFR: Estimated Creatinine Clearance: 40.3 mL/min (A) (by C-G formula based on SCr of 1.63 mg/dL (H)). Liver Function Tests: Recent Labs  Lab 01/05/19 1502 01/10/19 0013  AST 21 23  ALT 46* 43  ALKPHOS 64 56  BILITOT 1.9* 1.5*  PROT 7.1 6.8  ALBUMIN 4.0 3.6   No results for input(s): LIPASE, AMYLASE in the last 168 hours. No results for input(s): AMMONIA in the last 168 hours. Coagulation Profile: No results for input(s): INR, PROTIME in the last 168 hours. Cardiac Enzymes: Recent Labs  Lab 01/10/19 0013  TROPONINI 0.12*   BNP (last 3 results) No results for input(s): PROBNP in the last 8760 hours. HbA1C: No results for input(s): HGBA1C in the last 72 hours. CBG: Recent Labs  Lab 01/06/19 0726 01/07/19 0804 01/08/19 0727  GLUCAP 257* 224* 213*   Lipid Profile: No results for input(s): CHOL, HDL, LDLCALC, TRIG, CHOLHDL, LDLDIRECT in the last 72 hours. Thyroid Function Tests: No results for input(s): TSH, T4TOTAL, FREET4, T3FREE, THYROIDAB in the last 72 hours. Anemia Panel: No results for input(s): VITAMINB12, FOLATE, FERRITIN, TIBC, IRON, RETICCTPCT in the last 72 hours. Urine analysis:    Component Value Date/Time   COLORURINE YELLOW 01/06/2019 0030   APPEARANCEUR CLEAR 01/06/2019 0030   LABSPEC 1.026 01/06/2019 0030   PHURINE 5.0 01/06/2019 0030   GLUCOSEU 50 (A) 01/06/2019 0030   HGBUR NEGATIVE 01/06/2019 0030    BILIRUBINUR NEGATIVE 01/06/2019 0030   KETONESUR NEGATIVE 01/06/2019 0030   PROTEINUR 100 (A) 01/06/2019 0030   NITRITE NEGATIVE 01/06/2019 0030   LEUKOCYTESUR NEGATIVE 01/06/2019 0030   Sepsis Labs: @LABRCNTIP (procalcitonin:4,lacticidven:4) ) Recent Results (from the past  240 hour(s))  Blood Culture (routine x 2)     Status: None (Preliminary result)   Collection Time: 01/05/19  2:47 PM  Result Value Ref Range Status   Specimen Description   Final    BLOOD LEFT ANTECUBITAL Performed at [facility] [address] [place], [place] [address]    Special Requests   Final    Blood Culture adequate volume BOTTLES DRAWN AEROBIC AND ANAEROBIC   Culture   Final    NO GROWTH 4 DAYS Performed at [facility], [place] [address], [place], [place] [address]    Report Status PENDING  Incomplete  Blood Culture (routine x 2)     Status: None (Preliminary result)   Collection Time: 01/05/19  3:07 PM  Result Value Ref Range Status   Specimen Description   Final    BLOOD RIGHT ANTECUBITAL Performed at [place] [address], [place], [place] [address]    Special Requests   Final    BOTTLES DRAWN AEROBIC AND ANAEROBIC Blood Culture adequate volume Performed at [facility] [address] [place], [place] [address]    Culture   Final    NO GROWTH 4 DAYS Performed at [facility], [place] [address], [place], [place] [address]    Report Status PENDING  Incomplete  Culture, sputum-assessment     Status: None   Collection Time: 01/05/19  5:31 PM  Result Value Ref Range Status   Specimen Description EXPECTORATED SPUTUM  Final   Special Requests NONE  Final   Sputum evaluation   Final    THIS SPECIMEN IS ACCEPTABLE FOR SPUTUM CULTURE Performed at [facility], [place] [address] [place], [place] [address]    Report Status 01/06/2019 FINAL  Final  Culture, respiratory     Status: None   Collection Time:  01/05/19  5:31 PM  Result Value Ref Range Status   Specimen Description   Final    EXPECTORATED SPUTUM Performed at [facility] [address], [place], [place] [address]    Special Requests   Final    NONE Reflexed from [phone] Performed at [facility], [place] [address] [place], [place] [address]    Gram Stain   Final    NO WBC SEEN RARE SQUAMOUS EPITHELIAL CELLS PRESENT FEW GRAM POSITIVE COCCI IN CLUSTERS FEW GRAM POSITIVE RODS RARE GRAM NEGATIVE RODS    Culture   Final    MODERATE Consistent with normal respiratory flora. Performed at [facility], [place] [address], [place],  [address]    Report Status 01/08/2019 FINAL  Final  MRSA PCR Screening     Status: Abnormal   Collection Time: 01/05/19  6:52 PM  Result Value Ref Range Status   MRSA by PCR POSITIVE (A) NEGATIVE Final    Comment:        The GeneXpert MRSA Assay (FDA approved for NASAL specimens only), is one component of a comprehensive MRSA colonization surveillance program. It is not intended to diagnose MRSA infection nor to guide or monitor treatment for MRSA infections. RESULT CALLED TO, READ BACK BY AND VERIFIED WITH: C.ADDISON AT 2145 ON 01/05/19 BY N.THOMPSON Performed at [facility], [place] [address], [place],  [address]   Culture, Urine     Status: None   Collection Time: 01/06/19 12:30 AM  Result Value Ref Range Status   Specimen Description   Final  URINE, CLEAN CATCH Performed at [facility], [place] [address], [place], [place] [address]    Special Requests   Final    NONE Performed at [facility], [place] [address] [place], [place] [address]    Culture   Final    NO GROWTH Performed at [facility], [place] [address], [place], [place] [address]    Report Status 01/07/2019 FINAL  Final  Blood culture (routine x 2)     Status: None (Preliminary result)   Collection Time:  01/10/19  1:09 AM  Result Value Ref Range Status   Specimen Description BLOOD RIGHT FOREARM  Final   Special Requests   Final    BOTTLES DRAWN AEROBIC AND ANAEROBIC Blood Culture adequate volume Performed at [facility], [address], [place], [place] [address]    Culture PENDING  Incomplete   Report Status PENDING  Incomplete     Radiological Exams on Admission: Dg Chest Portable 1 View  Result Date: 01/10/2019 CLINICAL DATA:  Shortness of breath and pneumonia. EXAM: PORTABLE CHEST 1 VIEW COMPARISON:  01/05/2019 FINDINGS: Lungs are adequately inflated with worsening opacification over the medial right base and persistent hazy opacification over the left base/retrocardiac region. Cardiomediastinal silhouette and remainder of the exam is unchanged. IMPRESSION: Persistent bibasilar opacification with mild worsening in the right base. Findings likely due to worsening infection. Possible associated left effusion. Electronically Signed   By: Marin Olp M.D.   On: 01/10/2019 00:29    EKG: Independently reviewed. Atrial fibrillation, RBBB, LAFB, LVH with repolarization abnormality.   Assessment/Plan  1. Pneumonia  - Presents with worsening SOB, cough, and wheeze  - He had been treated with Augmentin early in the month, was then admitted 2/11-2/14 and treated with Rocephin and azithromycin, had improved by time of discharge, but is now worsening again  - MRSA pcr was positive during recent admission, but cultures negative  - Blood culture collected in ED and empiric vancomycin and cefepime was started  - Continue current antibiotics, follow cultures and clinical course    2. Chronic combined systolic and diastolic CHF  - Wt appears to be up from recent admission and BNP is elevated but there is no peripheral edema, JVD, or pulm edema on CXR  - Echo from March '19 with EF 35-40%, moderate LVH, diffuse HK, grade 2 diastolic dysfunction, and mild LAE and TR  - He was given a 500 cc NS bolus  in ED  - SLIV for now, follow daily wt and I/O's, continue Coreg, and hold Entresto until renal function stabilizes   3. Atrial fibrillation  - In rate-controlled a fib on admission  - CHADS-VASc is 4 (age x2, CAD, CHF, HTN, DM)  - Continue Eliquis and Coreg   4. CAD; elevated troponin  - Hx of CABG, denies any chest discomfort  - Troponin is 0.12 in ED, likely reflecting demand ischemia in setting of PNA and COPD exacerbation with AKI on CKD  - Continue cardiac monitoring, trend troponin, continue beta-blocker and Eliquis   5. Acute kidney injury superimposed on CKD III  - SCr is 1.63 on admission, up from 1.21 <48 hrs earlier  - He was given 500 cc NS in ED  - Check urine chemistries, hold Entresto, renally-dose medications, and repeat chem panel tomorrow    6. COPD with acute exacerbation  - Check sputum culture, continue steroids and antibiotic, continue nebs    7. Type II DM  - Diet-controlled at home -  Serum glucose is 214 on admission and he is being treated with systemic steroids  - Monitor with CBG's and use a low-intensity SSI with Novolog for now    DVT prophylaxis: Eliquis  Code Status: DNR  Family Communication: Wife and daughter updated at bedside  Consults called: None Admission status: Observation     Vianne Bulls, MD [facility] Pager [phone]  If 7PM-7AM, please contact night-coverage [email or web address] Password [email or web address]  01/10/2019, 1:54 AM

## 2019-01-10 NOTE — ED Notes (Signed)
CT stated it would be 5 mins until they came to get pt.

## 2019-01-10 NOTE — Progress Notes (Addendum)
Patient admitted to the hospital earlier this morning by Dr. Myna Hidalgo.  Patient seen and examined.  Feels that his breathing is "miserable".  Continues to cough.  Chest exam shows bilateral wheezing with rhonchi.  He has no lower extremity edema.  Patient was recently in the hospital and discharged on 2/14 being treated for pneumonia and COPD exacerbation.  He returned to the hospital the following day with worsening shortness of breath.  Chest x-ray showed possible worsening pneumonia.  He was started on vancomycin and cefepime.  He was also started on intravenous steroids and bronchodilators for component of COPD exacerbation.  Will continue on pulmonary hygiene.  May need to evaluate for home oxygen.  Reports that his exercise tolerance at baseline is poor.  He normally ambulates with a cane/walker.  He does have chronic combined CHF which appears to be compensated.  Delene Loll is currently on hold since creatinine has had a bump since admission.  Will recheck in a.m.  Jehanzeb Memon   Addendum 17:30:  Discussed care with patient family.  I had a telephone conversation regarding patient status and plan of care with his daughter, Dr. Tamala Julian who is a physician in [place].  Available labs were reviewed.  She reports that patient has a history of lung cancer in the past and did not have adequate follow-up.  He has not had any imaging of his chest in our system since his initial diagnosis.  Will order noncontrast CT chest to evaluate for an underlying malignancy.  We will also check procalcitonin.  The patient does have a history of combined CHF with ejection fraction of 35 to 40% in 01/2018.  BNP was noted to be elevated on admission and degree of elevation was felt to be concerning.  Cardiology consultation requested and repeat echocardiogram has been ordered.  Gary Odonnell

## 2019-01-10 NOTE — ED Notes (Signed)
Patient's daughter request to be notified of CT scan if abnormal.

## 2019-01-11 ENCOUNTER — Inpatient Hospital Stay (HOSPITAL_COMMUNITY): Payer: Medicare Other

## 2019-01-11 DIAGNOSIS — I361 Nonrheumatic tricuspid (valve) insufficiency: Secondary | ICD-10-CM | POA: Diagnosis not present

## 2019-01-11 DIAGNOSIS — N179 Acute kidney failure, unspecified: Secondary | ICD-10-CM | POA: Diagnosis present

## 2019-01-11 DIAGNOSIS — W19XXXA Unspecified fall, initial encounter: Secondary | ICD-10-CM

## 2019-01-11 DIAGNOSIS — I5023 Acute on chronic systolic (congestive) heart failure: Secondary | ICD-10-CM | POA: Diagnosis not present

## 2019-01-11 DIAGNOSIS — E1151 Type 2 diabetes mellitus with diabetic peripheral angiopathy without gangrene: Secondary | ICD-10-CM | POA: Diagnosis present

## 2019-01-11 DIAGNOSIS — Z20828 Contact with and (suspected) exposure to other viral communicable diseases: Secondary | ICD-10-CM | POA: Diagnosis not present

## 2019-01-11 DIAGNOSIS — R7989 Other specified abnormal findings of blood chemistry: Secondary | ICD-10-CM | POA: Diagnosis present

## 2019-01-11 DIAGNOSIS — J181 Lobar pneumonia, unspecified organism: Secondary | ICD-10-CM | POA: Diagnosis not present

## 2019-01-11 DIAGNOSIS — J9601 Acute respiratory failure with hypoxia: Secondary | ICD-10-CM | POA: Diagnosis present

## 2019-01-11 DIAGNOSIS — I420 Dilated cardiomyopathy: Secondary | ICD-10-CM | POA: Diagnosis present

## 2019-01-11 DIAGNOSIS — I5043 Acute on chronic combined systolic (congestive) and diastolic (congestive) heart failure: Secondary | ICD-10-CM | POA: Diagnosis present

## 2019-01-11 DIAGNOSIS — I5020 Unspecified systolic (congestive) heart failure: Secondary | ICD-10-CM | POA: Diagnosis not present

## 2019-01-11 DIAGNOSIS — R911 Solitary pulmonary nodule: Secondary | ICD-10-CM | POA: Diagnosis present

## 2019-01-11 DIAGNOSIS — E1122 Type 2 diabetes mellitus with diabetic chronic kidney disease: Secondary | ICD-10-CM | POA: Diagnosis present

## 2019-01-11 DIAGNOSIS — I340 Nonrheumatic mitral (valve) insufficiency: Secondary | ICD-10-CM | POA: Diagnosis not present

## 2019-01-11 DIAGNOSIS — I2510 Atherosclerotic heart disease of native coronary artery without angina pectoris: Secondary | ICD-10-CM | POA: Diagnosis present

## 2019-01-11 DIAGNOSIS — Y842 Radiological procedure and radiotherapy as the cause of abnormal reaction of the patient, or of later complication, without mention of misadventure at the time of the procedure: Secondary | ICD-10-CM | POA: Diagnosis present

## 2019-01-11 DIAGNOSIS — D7589 Other specified diseases of blood and blood-forming organs: Secondary | ICD-10-CM | POA: Diagnosis present

## 2019-01-11 DIAGNOSIS — S299XXA Unspecified injury of thorax, initial encounter: Secondary | ICD-10-CM | POA: Diagnosis not present

## 2019-01-11 DIAGNOSIS — Z66 Do not resuscitate: Secondary | ICD-10-CM | POA: Diagnosis present

## 2019-01-11 DIAGNOSIS — I4819 Other persistent atrial fibrillation: Secondary | ICD-10-CM | POA: Diagnosis present

## 2019-01-11 DIAGNOSIS — E1165 Type 2 diabetes mellitus with hyperglycemia: Secondary | ICD-10-CM | POA: Diagnosis present

## 2019-01-11 DIAGNOSIS — R338 Other retention of urine: Secondary | ICD-10-CM | POA: Diagnosis not present

## 2019-01-11 DIAGNOSIS — I10 Essential (primary) hypertension: Secondary | ICD-10-CM | POA: Diagnosis not present

## 2019-01-11 DIAGNOSIS — I452 Bifascicular block: Secondary | ICD-10-CM | POA: Diagnosis present

## 2019-01-11 DIAGNOSIS — C3490 Malignant neoplasm of unspecified part of unspecified bronchus or lung: Secondary | ICD-10-CM | POA: Diagnosis present

## 2019-01-11 DIAGNOSIS — I255 Ischemic cardiomyopathy: Secondary | ICD-10-CM | POA: Diagnosis present

## 2019-01-11 DIAGNOSIS — R0781 Pleurodynia: Secondary | ICD-10-CM | POA: Diagnosis not present

## 2019-01-11 DIAGNOSIS — I130 Hypertensive heart and chronic kidney disease with heart failure and stage 1 through stage 4 chronic kidney disease, or unspecified chronic kidney disease: Secondary | ICD-10-CM | POA: Diagnosis present

## 2019-01-11 DIAGNOSIS — E119 Type 2 diabetes mellitus without complications: Secondary | ICD-10-CM | POA: Diagnosis not present

## 2019-01-11 DIAGNOSIS — I509 Heart failure, unspecified: Secondary | ICD-10-CM

## 2019-01-11 DIAGNOSIS — J9811 Atelectasis: Secondary | ICD-10-CM | POA: Diagnosis present

## 2019-01-11 DIAGNOSIS — D72829 Elevated white blood cell count, unspecified: Secondary | ICD-10-CM | POA: Diagnosis not present

## 2019-01-11 DIAGNOSIS — M546 Pain in thoracic spine: Secondary | ICD-10-CM | POA: Diagnosis not present

## 2019-01-11 DIAGNOSIS — M549 Dorsalgia, unspecified: Secondary | ICD-10-CM | POA: Diagnosis not present

## 2019-01-11 DIAGNOSIS — F17290 Nicotine dependence, other tobacco product, uncomplicated: Secondary | ICD-10-CM | POA: Diagnosis present

## 2019-01-11 DIAGNOSIS — J441 Chronic obstructive pulmonary disease with (acute) exacerbation: Secondary | ICD-10-CM

## 2019-01-11 DIAGNOSIS — I480 Paroxysmal atrial fibrillation: Secondary | ICD-10-CM

## 2019-01-11 DIAGNOSIS — W010XXA Fall on same level from slipping, tripping and stumbling without subsequent striking against object, initial encounter: Secondary | ICD-10-CM | POA: Diagnosis present

## 2019-01-11 DIAGNOSIS — N183 Chronic kidney disease, stage 3 (moderate): Secondary | ICD-10-CM | POA: Diagnosis present

## 2019-01-11 DIAGNOSIS — I5042 Chronic combined systolic (congestive) and diastolic (congestive) heart failure: Secondary | ICD-10-CM | POA: Diagnosis not present

## 2019-01-11 DIAGNOSIS — J449 Chronic obstructive pulmonary disease, unspecified: Secondary | ICD-10-CM | POA: Diagnosis present

## 2019-01-11 DIAGNOSIS — E782 Mixed hyperlipidemia: Secondary | ICD-10-CM | POA: Diagnosis present

## 2019-01-11 DIAGNOSIS — S3982XA Other specified injuries of lower back, initial encounter: Secondary | ICD-10-CM | POA: Diagnosis not present

## 2019-01-11 DIAGNOSIS — D696 Thrombocytopenia, unspecified: Secondary | ICD-10-CM | POA: Diagnosis present

## 2019-01-11 LAB — CBC WITH DIFFERENTIAL/PLATELET
Abs Immature Granulocytes: 0.07 10*3/uL (ref 0.00–0.07)
Basophils Absolute: 0 10*3/uL (ref 0.0–0.1)
Basophils Relative: 0 %
Eosinophils Absolute: 0 10*3/uL (ref 0.0–0.5)
Eosinophils Relative: 0 %
HCT: 43.1 % (ref 39.0–52.0)
Hemoglobin: 12.6 g/dL — ABNORMAL LOW (ref 13.0–17.0)
Immature Granulocytes: 1 %
Lymphocytes Relative: 3 %
Lymphs Abs: 0.4 10*3/uL — ABNORMAL LOW (ref 0.7–4.0)
MCH: 29 pg (ref 26.0–34.0)
MCHC: 29.2 g/dL — ABNORMAL LOW (ref 30.0–36.0)
MCV: 99.3 fL (ref 80.0–100.0)
MONO ABS: 0.1 10*3/uL (ref 0.1–1.0)
Monocytes Relative: 1 %
Neutro Abs: 9.9 10*3/uL — ABNORMAL HIGH (ref 1.7–7.7)
Neutrophils Relative %: 95 %
Platelets: 121 10*3/uL — ABNORMAL LOW (ref 150–400)
RBC: 4.34 MIL/uL (ref 4.22–5.81)
RDW: 16 % — ABNORMAL HIGH (ref 11.5–15.5)
WBC: 10.4 10*3/uL (ref 4.0–10.5)
nRBC: 0 % (ref 0.0–0.2)

## 2019-01-11 LAB — ECHOCARDIOGRAM COMPLETE
Height: 71 in
Weight: 3425.07 oz

## 2019-01-11 LAB — COMPREHENSIVE METABOLIC PANEL
ALT: 39 U/L (ref 0–44)
AST: 15 U/L (ref 15–41)
Albumin: 3.5 g/dL (ref 3.5–5.0)
Alkaline Phosphatase: 51 U/L (ref 38–126)
Anion gap: 8 (ref 5–15)
BUN: 32 mg/dL — AB (ref 8–23)
CO2: 26 mmol/L (ref 22–32)
CREATININE: 1.31 mg/dL — AB (ref 0.61–1.24)
Calcium: 9.4 mg/dL (ref 8.9–10.3)
Chloride: 106 mmol/L (ref 98–111)
GFR calc Af Amer: 58 mL/min — ABNORMAL LOW (ref >60)
GFR calc non Af Amer: 50 mL/min — ABNORMAL LOW (ref >60)
Glucose, Bld: 218 mg/dL — ABNORMAL HIGH (ref 70–99)
Potassium: 4.7 mmol/L (ref 3.5–5.1)
Sodium: 140 mmol/L (ref 135–145)
Total Bilirubin: 1.9 mg/dL — ABNORMAL HIGH (ref 0.3–1.2)
Total Protein: 6.2 g/dL — ABNORMAL LOW (ref 6.5–8.1)

## 2019-01-11 LAB — PROCALCITONIN: Procalcitonin: <0.1 ng/mL

## 2019-01-11 LAB — GLUCOSE, CAPILLARY
GLUCOSE-CAPILLARY: 265 mg/dL — AB (ref 70–99)
Glucose-Capillary: 197 mg/dL — ABNORMAL HIGH (ref 70–99)
Glucose-Capillary: 243 mg/dL — ABNORMAL HIGH (ref 70–99)
Glucose-Capillary: 267 mg/dL — ABNORMAL HIGH (ref 70–99)

## 2019-01-11 LAB — HEMOGLOBIN A1C
Hgb A1c MFr Bld: 6.5 % — ABNORMAL HIGH (ref 4.8–5.6)
Mean Plasma Glucose: 140 mg/dL

## 2019-01-11 MED ORDER — METHYLPREDNISOLONE SODIUM SUCC 125 MG IJ SOLR
60.0000 mg | Freq: Two times a day (BID) | INTRAMUSCULAR | Status: DC
  Administered 2019-01-12 – 2019-01-14 (×5): 60 mg via INTRAVENOUS
  Filled 2019-01-11 (×5): qty 2

## 2019-01-11 MED ORDER — APIXABAN 5 MG PO TABS
5.0000 mg | ORAL_TABLET | Freq: Two times a day (BID) | ORAL | Status: DC
  Administered 2019-01-11 – 2019-01-18 (×14): 5 mg via ORAL
  Filled 2019-01-11 (×14): qty 1

## 2019-01-11 MED ORDER — FUROSEMIDE 10 MG/ML IJ SOLN
40.0000 mg | Freq: Once | INTRAMUSCULAR | Status: AC
  Administered 2019-01-11: 40 mg via INTRAVENOUS
  Filled 2019-01-11: qty 4

## 2019-01-11 NOTE — Progress Notes (Signed)
PROGRESS NOTE    Gary Odonnell  HKV:[id number] DOB: 1932-09-19 DOA: 01/09/2019 PCP: Glenda Chroman, MD    Brief Narrative:  85 year old male with a history of chronic combined CHF with ejection fraction 35%, COPD, atrial fibrillation on anticoagulation, hypertension and diabetes, presented to the hospital with complaints of shortness of breath, cough and wheezing.  He was recently discharged from the hospital after being treated for COPD exacerbation with pneumonia.  Initially felt better on discharge, but shortly after returning home, the following day, he began to feel more short of breath and needed to come back to the hospital.  Work-up in the emergency room indicated an elevated BNP.  CT scan of the chest showed bilateral pleural effusions.  He was admitted to the hospital for further management of CHF exacerbation.   Assessment & Plan:   Principal Problem:   Pneumonia Active Problems:   HTN (hypertension)   Chronic combined systolic and diastolic CHF (congestive heart failure) (HCC)   CAD (coronary atherosclerotic disease)   COPD with acute exacerbation (HCC)   A-fib (HCC)   Acute renal failure superimposed on stage 3 chronic kidney disease (HCC)   Diet-controlled diabetes mellitus (HCC)   CHF (congestive heart failure) ([place])   1. Acute on chronic combined CHF.  Echocardiogram repeated today and shows that EF is unchanged from 01/2018 at 35 to 40%.  He has evidence of bilateral pleural effusions.  This is likely the cause of his shortness of breath.  Cardiology following and started him on IV Lasix.  Monitor urine output.  Delene Loll was held on admission due to bump in creatinine. 2. Pneumonia.  Recently treated for pneumonia during his last admission.  CT chest done on admission does not show any evidence of persistent pneumonia.  He is afebrile.  Procalcitonin is negative.  Will discontinue further antibiotics and observe. 3. Atrial fibrillation.  Currently rate controlled.   Continue Coreg and Eliquis. 4. Coronary artery disease.  Mildly elevated troponin likely related strain.  No evidence of ACS.  No complaints of chest pain. 5. Acute kidney injury on chronic kidney disease stage III.  Creatinine 1.6 on admission.  This is trended down today to 1.3.  Continue to monitor in the setting of diuresis. 6. COPD.  Continue on steroids, nebulizer treatments and pulmonary hygiene. 7. Diabetes.  This is diet controlled.  Blood sugars currently elevated in the setting of steroids.  Continue on sliding scale insulin. 8. Mechanical fall.  Patient does complain of some pain in his ribs after a fall.  Will check x-ray of ribs.   DVT prophylaxis: Apixaban Code Status: DNR Family Communication: Discussed with patient son-in-law at the bedside Disposition Plan: Discharge home once respiratory status has improved   Consultants:   Cardiology  Procedures:  Echo: 1. The left ventricle has moderate-severely reduced systolic function, with an ejection fraction of 30-35%. The cavity size was mildly dilated. There is moderately increased left ventricular wall thickness. Left ventricular diastolic Doppler parameters  are indeterminate.  2. The right ventricle has normal systolic function. The cavity was normal. There is no increase in right ventricular wall thickness.  3. Left atrial size was moderately dilated.  4. Trivial pericardial effusion is present.  5. The mitral valve is degenerative. Moderate thickening of the mitral valve leaflet. Moderate calcification of the mitral valve leaflet. There is moderate mitral annular calcification present.  6. The tricuspid valve is normal in structure.  7. The aortic valve is tricuspid Moderate thickening of the aortic  valve Moderate calcification of the aortic valve. mild stenosis of the aortic valve.  8. The pulmonic valve was grossly normal. Pulmonic valve regurgitation is mild by color flow Doppler.   9. Right atrial pressure is  estimated at 8 mmHg.  Antimicrobials:   Vancomycin 2/16 >  Cefepime 2/16 >   Subjective: Overall he feels that his breathing is improving.  He does not have a cough.  The patient did have a fall earlier today when he was in the bathroom.  Reports that he slipped on something and fell onto his bottom.  Denies any head injury.  Denies any loss of consciousness.  Objective: Vitals:   01/11/19 0903 01/11/19 0906 01/11/19 1336 01/11/19 1427  BP:   (!) 149/73 115/62  Pulse:    75  Resp:   18 18  Temp:    98 F (36.7 C)  TempSrc:    Oral  SpO2: 98% 100%  99%  Weight:      Height:        Intake/Output Summary (Last 24 hours) at 01/11/2019 1743 Last data filed at 01/11/2019 9480 Gross per 24 hour  Intake 522.83 ml  Output 100 ml  Net 422.83 ml   Filed Weights   01/09/19 2355 01/10/19 2103 01/11/19 0500  Weight: 96.5 kg 97.1 kg 97.1 kg    Examination:  General exam: Appears calm and comfortable  Respiratory system: crackles at bases. Respiratory effort normal. Cardiovascular system: S1 & S2 heard, RRR. No JVD, murmurs, rubs, gallops or clicks.  Gastrointestinal system: Abdomen is nondistended, soft and nontender. No organomegaly or masses felt. Normal bowel sounds heard. Central nervous system: Alert and oriented. No focal neurological deficits. Extremities: 1+ pitting edema bilaterally Skin: No rashes, lesions or ulcers Psychiatry: Judgement and insight appear normal. Mood & affect appropriate.    Data Reviewed: I have personally reviewed following labs and imaging studies  CBC: Recent Labs  Lab 01/05/19 1502 01/06/19 0553 01/10/19 0013 01/11/19 0615  WBC 6.9 5.7 8.5 10.4  NEUTROABS 5.3  --  6.6 9.9*  HGB 14.1 12.3* 13.4 12.6*  HCT 48.5 42.7 46.5 43.1  MCV 99.6 103.6* 102.2* 99.3  PLT 178 117* 122* 165*   Basic Metabolic Panel: Recent Labs  Lab 01/05/19 2011 01/06/19 0553 01/07/19 0859 01/08/19 1030 01/10/19 0013 01/11/19 0615  NA  --  141 140 137 140  140  K  --  4.6 4.4 4.8 4.8 4.7  CL  --  108 108 107 107 106  CO2  --  22 25 24 27 26   GLUCOSE  --  261* 232* 275* 214* 218*  BUN  --  25* 28* 25* 30* 32*  CREATININE  --  1.16 1.26* 1.21 1.63* 1.31*  CALCIUM  --  9.1 9.4 9.1 9.3 9.4  MG 2.2  --   --   --   --   --    GFR: Estimated Creatinine Clearance: 49.9 mL/min (A) (by C-G formula based on SCr of 1.31 mg/dL (H)). Liver Function Tests: Recent Labs  Lab 01/05/19 1502 01/10/19 0013 01/11/19 0615  AST 21 23 15   ALT 46* 43 39  ALKPHOS 64 56 51  BILITOT 1.9* 1.5* 1.9*  PROT 7.1 6.8 6.2*  ALBUMIN 4.0 3.6 3.5   No results for input(s): LIPASE, AMYLASE in the last 168 hours. No results for input(s): AMMONIA in the last 168 hours. Coagulation Profile: No results for input(s): INR, PROTIME in the last 168 hours. Cardiac Enzymes: Recent Labs  Lab  01/10/19 0013 01/10/19 0350 01/10/19 1325 01/10/19 1752  TROPONINI 0.12* 0.09* 0.07* 0.08*   BNP (last 3 results) No results for input(s): PROBNP in the last 8760 hours. HbA1C: Recent Labs    01/10/19 0348  HGBA1C 6.5*   CBG: Recent Labs  Lab 01/10/19 1707 01/10/19 2105 01/11/19 0804 01/11/19 1134 01/11/19 1623  GLUCAP 244* 208* 197* 243* 265*   Lipid Profile: No results for input(s): CHOL, HDL, LDLCALC, TRIG, CHOLHDL, LDLDIRECT in the last 72 hours. Thyroid Function Tests: No results for input(s): TSH, T4TOTAL, FREET4, T3FREE, THYROIDAB in the last 72 hours. Anemia Panel: No results for input(s): VITAMINB12, FOLATE, FERRITIN, TIBC, IRON, RETICCTPCT in the last 72 hours. Sepsis Labs: Recent Labs  Lab 01/05/19 1859 01/05/19 2315 01/10/19 0136 01/10/19 0347 01/10/19 1754 01/11/19 0615  PROCALCITON  --   --   --   --  <0.10 <0.10  LATICACIDVEN 2.0* 1.2 1.6 2.4*  --   --     Recent Results (from the past 240 hour(s))  Blood Culture (routine x 2)     Status: None   Collection Time: 01/05/19  2:47 PM  Result Value Ref Range Status   Specimen Description    Final    BLOOD LEFT ANTECUBITAL Performed at [facility], [place] [address] [place], [place] [address]    Special Requests   Final    Blood Culture adequate volume BOTTLES DRAWN AEROBIC AND ANAEROBIC   Culture   Final    NO GROWTH 5 DAYS Performed at [facility], [place] [address] [place], [place] [address]    Report Status 01/10/2019 FINAL  Final  Blood Culture (routine x 2)     Status: None   Collection Time: 01/05/19  3:07 PM  Result Value Ref Range Status   Specimen Description   Final    BLOOD RIGHT ANTECUBITAL Performed at [place] [address], [place], [place] [address]    Special Requests   Final    BOTTLES DRAWN AEROBIC AND ANAEROBIC Blood Culture adequate volume Performed at [facility] [place] [address], [place], [place] [address]    Culture   Final    NO GROWTH 5 DAYS Performed at [facility], [place] [address], [place], [place] [address]    Report Status 01/10/2019 FINAL  Final  Culture, sputum-assessment     Status: None   Collection Time: 01/05/19  5:31 PM  Result Value Ref Range Status   Specimen Description EXPECTORATED SPUTUM  Final   Special Requests NONE  Final   Sputum evaluation   Final    THIS SPECIMEN IS ACCEPTABLE FOR SPUTUM CULTURE Performed at [facility], [place] [address], [place], [place] [address]    Report Status 01/06/2019 FINAL  Final  Culture, respiratory     Status: None   Collection Time: 01/05/19  5:31 PM  Result Value Ref Range Status   Specimen Description   Final    EXPECTORATED SPUTUM Performed at [place] [address], [place], [place] [address]    Special Requests   Final    NONE Reflexed from [id number] Performed at [facility], [place] [address], [place], [place] [address]    Gram Stain   Final    NO WBC SEEN RARE SQUAMOUS EPITHELIAL CELLS PRESENT FEW GRAM POSITIVE COCCI IN CLUSTERS FEW GRAM  POSITIVE RODS RARE GRAM NEGATIVE RODS    Culture   Final  MODERATE Consistent with normal respiratory flora. Performed at [facility], [place] [address] [place], [place] [address]    Report Status 01/08/2019 FINAL  Final  MRSA PCR Screening     Status: Abnormal   Collection Time: 01/05/19  6:52 PM  Result Value Ref Range Status   MRSA by PCR POSITIVE (A) NEGATIVE Final    Comment:        The GeneXpert MRSA Assay (FDA approved for NASAL specimens only), is one component of a comprehensive MRSA colonization surveillance program. It is not intended to diagnose MRSA infection nor to guide or monitor treatment for MRSA infections. RESULT CALLED TO, READ BACK BY AND VERIFIED WITH: C.ADDISON AT 2145 ON 01/05/19 BY N.THOMPSON Performed at [facility], [place] [address] [place], [place] [address]   Culture, Urine     Status: None   Collection Time: 01/06/19 12:30 AM  Result Value Ref Range Status   Specimen Description   Final    URINE, CLEAN CATCH Performed at [facility], [place] [address] [place], [place] [address]    Special Requests   Final    NONE Performed at [facility], [place] [address], [place], [place] [address]    Culture   Final    NO GROWTH Performed at [facility], [place] [address], [place], [place] [address]    Report Status 01/07/2019 FINAL  Final  Blood culture (routine x 2)     Status: None (Preliminary result)   Collection Time: 01/10/19  1:09 AM  Result Value Ref Range Status   Specimen Description BLOOD RIGHT FOREARM  Final   Special Requests   Final    BOTTLES DRAWN AEROBIC AND ANAEROBIC Blood Culture adequate volume   Culture   Final    NO GROWTH 1 DAY Performed at [facility], [address], [place], [place] [address]    Report Status PENDING  Incomplete  Blood culture (routine x 2)     Status: None (Preliminary result)   Collection Time: 01/10/19  1:36 AM  Result Value Ref Range  Status   Specimen Description LEFT ANTECUBITAL  Final   Special Requests   Final    BOTTLES DRAWN AEROBIC AND ANAEROBIC Blood Culture adequate volume   Culture   Final    NO GROWTH 1 DAY Performed at [facility], [address], [place],  [address]    Report Status PENDING  Incomplete         Radiology Studies: Dg Ribs Bilateral W/chest  Result Date: 01/11/2019 CLINICAL DATA:  Left rib pain after fall. EXAM: BILATERAL RIBS AND CHEST - 4+ VIEW COMPARISON:  CT scan and radiograph of January 10, 2019. FINDINGS: Stable cardiomegaly. No pneumothorax is noted. No evidence of rib fracture is noted. Small bilateral pleural effusions are noted. Sternotomy wires are noted. IMPRESSION: No evidence of rib fracture. Small bilateral pleural effusions are noted. Stable cardiomegaly. Electronically Signed   By: Marijo Conception, M.D.   On: 01/11/2019 15:25   Ct Chest Wo Contrast  Result Date: 01/10/2019 CLINICAL DATA:  Pneumonia EXAM: CT CHEST WITHOUT CONTRAST TECHNIQUE: Multidetector CT imaging of the chest was performed following the standard protocol without IV contrast. COMPARISON:  Chest CT dated 07/04/2015. Chest x-ray dated 01/10/2019 FINDINGS: Cardiovascular: Cardiomegaly. No pericardial effusion. Diffuse coronary artery calcifications. Aortic atherosclerosis. No aortic aneurysm. Mediastinum/Nodes: No enlarged lymph nodes identified within the mediastinum or perihilar regions. Esophagus appears normal. Trachea and central bronchi are unremarkable.  Lungs/Pleura: Bilateral pleural effusions, both moderate to large in size. mild associated compressive atelectasis within the lower lobes bilaterally. Mild perihilar edema. Emphysematous changes within the lung apices. Upper Abdomen: Small ascites within the upper abdomen, incompletely imaged. Limited images of the upper abdomen are otherwise unremarkable. Musculoskeletal: No acute or suspicious osseous finding. Degenerative spondylosis of the mid and  lower thoracic spine, mild to moderate in degree. Chronic appearing compression deformity of the T12 vertebral body. IMPRESSION: 1. Bilateral pleural effusions, both moderate to large in size, with associated compressive atelectasis within the lower lobes bilaterally. 2. No evidence of pneumonia. 3. Emphysematous changes within the lung apices. 4. Small ascites within the upper abdomen, incompletely imaged. 5. Cardiomegaly. Aortic Atherosclerosis (ICD10-I70.0) and Emphysema (ICD10-J43.9). Electronically Signed   By: Franki Cabot M.D.   On: 01/10/2019 18:54   Dg Chest Portable 1 View  Result Date: 01/10/2019 CLINICAL DATA:  Shortness of breath and pneumonia. EXAM: PORTABLE CHEST 1 VIEW COMPARISON:  01/05/2019 FINDINGS: Lungs are adequately inflated with worsening opacification over the medial right base and persistent hazy opacification over the left base/retrocardiac region. Cardiomediastinal silhouette and remainder of the exam is unchanged. IMPRESSION: Persistent bibasilar opacification with mild worsening in the right base. Findings likely due to worsening infection. Possible associated left effusion. Electronically Signed   By: Marin Olp M.D.   On: 01/10/2019 00:29        Scheduled Meds: . apixaban  5 mg Oral BID  . budesonide (PULMICORT) nebulizer solution  0.25 mg Nebulization BID  . carvedilol  6.25 mg Oral BID WC  . ezetimibe  10 mg Oral Daily  . fluticasone  2 spray Each Nare Daily  . guaiFENesin  1,200 mg Oral BID  . hydrALAZINE  25 mg Oral Q8H  . insulin aspart  0-5 Units Subcutaneous QHS  . insulin aspart  0-9 Units Subcutaneous TID WC  . ipratropium-albuterol  3 mL Nebulization Q6H  . methylPREDNISolone (SOLU-MEDROL) injection  60 mg Intravenous Q6H  . nicotine  14 mg Transdermal Daily  . sodium chloride flush  3 mL Intravenous Q12H  . sodium chloride flush  3 mL Intravenous Q12H   Continuous Infusions: . sodium chloride 250 mL (01/10/19 2201)  . ceFEPime (MAXIPIME) IV  2 g (01/10/19 2359)  . vancomycin 1,250 mg (01/10/19 2202)     LOS: 0 days    Time spent: 63mins    Kathie Dike, MD [facility]   If 7PM-7AM, please contact night-coverage [email or web address]  01/11/2019, 5:43 PM

## 2019-01-11 NOTE — Progress Notes (Signed)
Inpatient Diabetes Program Recommendations  AACE/ADA: New Consensus Statement on Inpatient Glycemic Control (2015)  Target Ranges:  Prepandial:   less than 140 mg/dL      Peak postprandial:   less than 180 mg/dL (1-2 hours)      Critically ill patients:  140 - 180 mg/dL   Lab Results  Component Value Date   GLUCAP 197 (H) 01/11/2019   HGBA1C 6.5 (H) 01/10/2019    Review of Glycemic Control  Diabetes history: DM2 Outpatient Diabetes medications: None Current orders for Inpatient glycemic control: Novolog sensitive correction tid + hs  Inpatient Diabetes Program Recommendations:   Hyperglycemia noted. While on steroids, -Increase Novolog correction to moderate tid + hs  Thank you, Nani Gasser. Stoney Karczewski, RN, MSN, CDE  Diabetes Coordinator Inpatient Glycemic Control Team Team Pager [phone] (8am-5pm) 01/11/2019 10:21 AM

## 2019-01-11 NOTE — Progress Notes (Signed)
*  PRELIMINARY RESULTS*Echocardiogram 2D Echocardiogram has been performed.  Gary Odonnell 01/11/2019, 11:29 AM

## 2019-01-11 NOTE — Consult Note (Signed)
Cardiology Consultation:   Patient ID: Gary Odonnell MRN: [id number]; DOB: [birth year]  Admit date: 01/09/2019 Date of Consult: 01/11/2019  Primary Care Provider: Glenda Chroman, MD Primary Cardiologist: Carlyle Dolly, MD   Primary Electrophysiologist:  None     Patient Profile:   Gary Odonnell is a 85 y.o. male with a hx of ischemic DCM who is being seen today for the evaluation of CHF at the request of Dr  .Roderic Palau.  History of Present Illness:   Gary Odonnell 85 y.o. Just d/c from hospital for Rx pneumonia on 01/08/19.  History of lung cancer with XRT Rx with augmentin As outpatient then admitted Cultures negative for strep normal sputum flora Thought to be due to COPD, radiation induced lung Injury and pneumonia.  Given iv Rocephin and Azithromycin and d/c on oxygen. Cardiac history includes CAMG with ischemic  DCM EF 30-35% On entresto but no diuretic. Denies LE edema but PND and orthopnea present. CXR with persistent basilar infiltrates ? Worsening infection BNP elevated 2650 and troponin .12 Started on steroid and inhalers and Vancomycin and Cefepime Last cath 2014 with patent LIMA occluded free RIMA to OM's occluded RCA with left to right collaterals. No focal targets for PCI Newly diagnosed afib 12/27/18 on coreg and eliquis. PVD with previous right fem pop bypass Denies claudication BUN 32 Cr 1.31 improved from 1.63 yesterday.    Past Medical History:  Diagnosis Date  . Cancer ([place])    L lung  . Coronary artery disease   . Hypercholesteremia   . Hypertension   . PAD (peripheral artery disease) ([place])   . Pneumonia 09/2013  . Radiation april 2016  . Solitary pulmonary nodule on lung CT 12/13/13   11/15/14  . Stroke [facility]) 11/2008    Past Surgical History:  Procedure Laterality Date  . Angioplasty to Left femoral artery    . CARPAL TUNNEL RELEASE Left 05/11/2014   Procedure: LEFT CARPAL TUNNEL RELEASE;  Surgeon: Carole Civil, MD;  Location: [facility] ORS;  Service:  Orthopedics;  Laterality: Left;  . COLONOSCOPY N/A 01/24/2015   Procedure: COLONOSCOPY;  Surgeon: Danie Binder, MD;  Location: [facility] ENDO SUITE;  Service: Endoscopy;  Laterality: N/A;  130  . CORONARY ARTERY BYPASS GRAFT     x4  . FEMORAL-POPLITEAL BYPASS GRAFT Right 10/23/2015   Procedure: BYPASS GRAFT FEMORAL TO BELOW KNEE POPLITEAL ARTERY USING RIGHT NON-REVERSED GREATER SAPPHENOUS VEIN;  Surgeon: [name], MD;  Location: [place];  Service: Vascular;  Laterality: Right;  . INTRAOPERATIVE ARTERIOGRAM Right 10/23/2015   Procedure: INTRA OPERATIVE ARTERIOGRAM - RIGHT LOWER LEG;  Surgeon: [name], MD;  Location: [place];  Service: Vascular;  Laterality: Right;  . KNEE ARTHROSCOPY Right   . LEFT AND RIGHT HEART CATHETERIZATION WITH CORONARY ANGIOGRAM N/A 09/29/2013   Procedure: LEFT AND RIGHT HEART CATHETERIZATION WITH CORONARY ANGIOGRAM;  Surgeon: Burnell Blanks, MD;  Location: [facility] C[facility] LAB;  Service: Cardiovascular;  Laterality: N/A;  . PERIPHERAL VASCULAR CATHETERIZATION N/A 04/21/2015   Procedure: Abdominal Aortogram w/Lower Extremity;  Surgeon: [name], MD;  Location: [place] CV LAB;  Service: Cardiovascular;  Laterality: N/A;  . Stent to right femoral artery    . TRIGGER FINGER RELEASE Bilateral   . VEIN HARVEST Right 10/23/2015   Procedure: [place];  Surgeon: [name], MD;  Location: [place];  Service: Vascular;  Laterality: Right;     Home Medications:  Prior to Admission medications  Medication Sig Start Date End Date Taking? Authorizing Provider  apixaban (ELIQUIS) 5 MG TABS tablet Take 1 tablet (5 mg total) by mouth 2 (two) times daily. 12/29/18  Yes Branch, Alphonse Guild, MD  azithromycin (ZITHROMAX) 500 MG tablet Take 1 tablet (500 mg total) by mouth daily for 5 days. Take 1 tablet daily for 3 days. 01/08/19 01/13/19 Yes Hosie Poisson, MD  Carboxymethylcellulose Sodium (EYE DROPS OP) Apply 1 drop to eye daily as needed  (dry eyes).    Yes [provider]  carvedilol (COREG) 6.25 MG tablet Take 1 tablet (6.25 mg total) by mouth 2 (two) times daily with a meal. 01/08/19  Yes Hosie Poisson, MD  ezetimibe (ZETIA) 10 MG tablet Take 1 tablet (10 mg total) by mouth daily. 11/16/18  Yes BranchAlphonse Guild, MD  fluticasone (FLONASE) 50 MCG/ACT nasal spray Place 2 sprays into both nostrils daily. 01/08/19  Yes Hosie Poisson, MD  guaiFENesin (MUCINEX) 600 MG 12 hr tablet Take 2 tablets (1,200 mg total) by mouth 2 (two) times daily. 01/08/19  Yes Hosie Poisson, MD  ipratropium (ATROVENT) 0.02 % nebulizer solution Take 2.5 mLs (0.5 mg total) by nebulization every 6 (six) hours as needed for wheezing or shortness of breath. 01/08/19  Yes Hosie Poisson, MD  levalbuterol (XOPENEX) 0.63 MG/3ML nebulizer solution Take 3 mLs (0.63 mg total) by nebulization every 4 (four) hours as needed for wheezing or shortness of breath. 01/08/19  Yes Hosie Poisson, MD  nicotine (NICODERM CQ - DOSED IN MG/24 HOURS) 14 mg/24hr patch Place 1 patch (14 mg total) onto the skin daily. 01/08/19  Yes Hosie Poisson, MD  potassium chloride (K-DUR,KLOR-CON) 10 MEQ tablet Take 1 tablet (10 mEq total) by mouth daily. 10/01/13  Yes Barrett, Evelene Croon, PA-C  predniSONE (DELTASONE) 20 MG tablet Prednisone 60 mg daily for 3 days followed by  Prednisone 40 mg daily for 5 days. 01/08/19  Yes Hosie Poisson, MD  sacubitril-valsartan (ENTRESTO) 97-103 MG Take 1 tablet by mouth 2 (two) times daily. 11/16/18  Yes BranchAlphonse Guild, MD    Inpatient Medications: Scheduled Meds: . apixaban  5 mg Oral BID  . budesonide (PULMICORT) nebulizer solution  0.25 mg Nebulization BID  . carvedilol  6.25 mg Oral BID WC  . ezetimibe  10 mg Oral Daily  . fluticasone  2 spray Each Nare Daily  . guaiFENesin  1,200 mg Oral BID  . hydrALAZINE  25 mg Oral Q8H  . insulin aspart  0-5 Units Subcutaneous QHS  . insulin aspart  0-9 Units Subcutaneous TID WC  . ipratropium-albuterol  3 mL  Nebulization Q6H  . methylPREDNISolone (SOLU-MEDROL) injection  60 mg Intravenous Q6H  . nicotine  14 mg Transdermal Daily  . sodium chloride flush  3 mL Intravenous Q12H  . sodium chloride flush  3 mL Intravenous Q12H   Continuous Infusions: . sodium chloride 250 mL (01/10/19 2201)  . ceFEPime (MAXIPIME) IV 2 g (01/10/19 2359)  . vancomycin 1,250 mg (01/10/19 2202)   PRN Meds: sodium chloride, acetaminophen **OR** acetaminophen, albuterol, hydrALAZINE, HYDROcodone-acetaminophen, ondansetron **OR** ondansetron (ZOFRAN) IV, senna-docusate, sodium chloride, sodium chloride flush  Allergies:   No Known Allergies  Social History:   Social History   Socioeconomic History  . Marital status: Married    Spouse name: Not on file  . Number of children: Not on file  . Years of education: Not on file  . Highest education level: Not on file  Occupational History  . Not on file  Social  Needs  . Financial resource strain: Not on file  . Food insecurity:    Worry: Not on file    Inability: Not on file  . Transportation needs:    Medical: Not on file    Non-medical: Not on file  Tobacco Use  . Smoking status: Current Every Day Smoker    Packs/day: 1.00    Years: 60.00    Pack years: 60.00    Types: Cigars    Start date: 11/21/1949  . Smokeless tobacco: Never Used  Substance and Sexual Activity  . Alcohol use: No    Alcohol/week: 0.0 standard drinks  . Drug use: No  . Sexual activity: Not on file  Lifestyle  . Physical activity:    Days per week: Not on file    Minutes per session: Not on file  . Stress: Not on file  Relationships  . Social connections:    Talks on phone: Not on file    Gets together: Not on file    Attends religious service: Not on file    Active member of club or organization: Not on file    Attends meetings of clubs or organizations: Not on file    Relationship status: Not on file  . Intimate partner violence:    Fear of current or ex partner: Not on  file    Emotionally abused: Not on file    Physically abused: Not on file    Forced sexual activity: Not on file  Other Topics Concern  . Not on file  Social History Narrative  . Not on file    Family History:    Family History  Problem Relation Age of Onset  . Heart attack Father   . Hyperlipidemia Father   . Diabetes Mother   . Diabetes Brother   . Colon cancer Neg Hx      ROS:  Please see the history of present illness.   All other ROS reviewed and negative.     Physical Exam/Data:   Vitals:   01/11/19 0500 01/11/19 0502 01/11/19 0903 01/11/19 0906  BP:  (!) 163/82    Pulse:  69    Resp:  (!) 21    Temp:  97.8 F (36.6 C)    TempSrc:  Oral    SpO2:  100% 98% 100%  Weight: 97.1 kg     Height:        Intake/Output Summary (Last 24 hours) at 01/11/2019 1206 Last data filed at 01/11/2019 7062 Gross per 24 hour  Intake 522.83 ml  Output 100 ml  Net 422.83 ml   Last 3 Weights 01/11/2019 01/10/2019 01/09/2019  Weight (lbs) 214 lb 1.1 oz 214 lb 1.1 oz 212 lb 11.9 oz  Weight (kg) 97.1 kg 97.1 kg 96.5 kg     Body mass index is 29.86 kg/m.  General:  Chronically ill white male  HEENT: normal Lymph: no adenopathy Neck: no JVD Endocrine:  No thryomegaly Vascular: No carotid bruits; FA pulses 2+ bilaterally without bruits  Cardiac:  Distant heart sounds SEM  Pulmonary exp  Wheezing,rhonchi and basilar rales  Abd: soft, nontender, no hepatomegaly  Ext: Trace LE  edema Musculoskeletal:  No deformities, BUE and BLE strength normal and equal Skin: warm and dry  Neuro:  CNs 2-12 intact, no focal abnormalities noted Psych:  Normal affect  Right fem pop bypass graft   EKG:  The EKG was personally reviewed and demonstrates:  afib LAD poor R wave progression no acute changes  Telemetry:  Telemetry was personally reviewed and demonstrates:  afib good rate control   Relevant CV Studies: TTE EF 35-40% inferior RWMA mild AS mean gradient 6 mmHg mild MR   Laboratory  Data:  Chemistry Recent Labs  Lab 01/08/19 1030 01/10/19 0013 01/11/19 0615  NA 137 140 140  K 4.8 4.8 4.7  CL 107 107 106  CO2 24 27 26   GLUCOSE 275* 214* 218*  BUN 25* 30* 32*  CREATININE 1.21 1.63* 1.31*  CALCIUM 9.1 9.3 9.4  GFRNONAA 55* 38* 50*  GFRAA >60 44* 58*  ANIONGAP 6 6 8     Recent Labs  Lab 01/05/19 1502 01/10/19 0013 01/11/19 0615  PROT 7.1 6.8 6.2*  ALBUMIN 4.0 3.6 3.5  AST 21 23 15   ALT 46* 43 39  ALKPHOS 64 56 51  BILITOT 1.9* 1.5* 1.9*   Hematology Recent Labs  Lab 01/06/19 0553 01/10/19 0013 01/11/19 0615  WBC 5.7 8.5 10.4  RBC 4.12* 4.55 4.34  HGB 12.3* 13.4 12.6*  HCT 42.7 46.5 43.1  MCV 103.6* 102.2* 99.3  MCH 29.9 29.5 29.0  MCHC 28.8* 28.8* 29.2*  RDW 16.1* 16.6* 16.0*  PLT 117* 122* 121*   Cardiac Enzymes Recent Labs  Lab 01/10/19 0013 01/10/19 0350 01/10/19 1325 01/10/19 1752  TROPONINI 0.12* 0.09* 0.07* 0.08*   No results for input(s): TROPIPOC in the last 168 hours.  BNP Recent Labs  Lab 01/10/19 0013  BNP 2,650.0*    DDimer No results for input(s): DDIMER in the last 168 hours.  Radiology/Studies:  Ct Chest Wo Contrast  Result Date: 01/10/2019 CLINICAL DATA:  Pneumonia EXAM: CT CHEST WITHOUT CONTRAST TECHNIQUE: Multidetector CT imaging of the chest was performed following the standard protocol without IV contrast. COMPARISON:  Chest CT dated 07/04/2015. Chest x-ray dated 01/10/2019 FINDINGS: Cardiovascular: Cardiomegaly. No pericardial effusion. Diffuse coronary artery calcifications. Aortic atherosclerosis. No aortic aneurysm. Mediastinum/Nodes: No enlarged lymph nodes identified within the mediastinum or perihilar regions. Esophagus appears normal. Trachea and central bronchi are unremarkable. Lungs/Pleura: Bilateral pleural effusions, both moderate to large in size. mild associated compressive atelectasis within the lower lobes bilaterally. Mild perihilar edema. Emphysematous changes within the lung apices. Upper  Abdomen: Small ascites within the upper abdomen, incompletely imaged. Limited images of the upper abdomen are otherwise unremarkable. Musculoskeletal: No acute or suspicious osseous finding. Degenerative spondylosis of the mid and lower thoracic spine, mild to moderate in degree. Chronic appearing compression deformity of the T12 vertebral body. IMPRESSION: 1. Bilateral pleural effusions, both moderate to large in size, with associated compressive atelectasis within the lower lobes bilaterally. 2. No evidence of pneumonia. 3. Emphysematous changes within the lung apices. 4. Small ascites within the upper abdomen, incompletely imaged. 5. Cardiomegaly. Aortic Atherosclerosis (ICD10-I70.0) and Emphysema (ICD10-J43.9). Electronically Signed   By: Franki Cabot M.D.   On: 01/10/2019 18:54   Dg Chest Portable 1 View  Result Date: 01/10/2019 CLINICAL DATA:  Shortness of breath and pneumonia. EXAM: PORTABLE CHEST 1 VIEW COMPARISON:  01/05/2019 FINDINGS: Lungs are adequately inflated with worsening opacification over the medial right base and persistent hazy opacification over the left base/retrocardiac region. Cardiomediastinal silhouette and remainder of the exam is unchanged. IMPRESSION: Persistent bibasilar opacification with mild worsening in the right base. Findings likely due to worsening infection. Possible associated left effusion. Electronically Signed   By: Marin Olp M.D.   On: 01/10/2019 00:29    Assessment and Plan:   1. CHF:  Possible exacerbation due to afib and loss of atrial kick.  Repeat TTE pending from today. Not clear why entresto stopped will give dose iv lasix and follow output will read TTE latter today when available  2. CAD/CABG doubt acute coronary event no acute ECG changes trend troponin may need f/u right and left heart cath If no improvement with diuresis  3. PAF:  Pulmonary status would prevent TEE/DCC currently will be on eliquis for 3 weeks in a week's time would  consider Elective DCC at that time rate control is fine continue eliquis  4. Pulmonary: continue steroids, inhalers and antibiotics COPD and radiation induced lung injury for Rx lung CA contribute to  His dyspnea       For questions or updates, please contact [place] Please consult [email or web address] for contact info under     Signed, Jenkins Rouge, MD  01/11/2019 12:06 PM

## 2019-01-11 NOTE — Progress Notes (Signed)
Pt was found in the bathroom floor yelling, "help," by B. Osaze Hubbert, Therapist, sports. He had attempted to ambulate to the bathroom alone and fell on his buttocks in the bathroom. No noticeable injuries noted. Vitals, WNL. Pt did c/o rib cage pain after the pain. Dr. Roderic Palau and attending RN made aware. Awaiting any further orders from MD. Patient now transitioned from a moderate fall risk to a high fall risk. All high fall risk precautions put into place.

## 2019-01-12 ENCOUNTER — Inpatient Hospital Stay (HOSPITAL_COMMUNITY): Payer: Medicare Other

## 2019-01-12 DIAGNOSIS — I5023 Acute on chronic systolic (congestive) heart failure: Secondary | ICD-10-CM

## 2019-01-12 LAB — BASIC METABOLIC PANEL
Anion gap: 8 (ref 5–15)
BUN: 36 mg/dL — ABNORMAL HIGH (ref 8–23)
CO2: 28 mmol/L (ref 22–32)
Calcium: 9.2 mg/dL (ref 8.9–10.3)
Chloride: 106 mmol/L (ref 98–111)
Creatinine, Ser: 1.24 mg/dL (ref 0.61–1.24)
GFR calc non Af Amer: 53 mL/min — ABNORMAL LOW (ref >60)
Glucose, Bld: 200 mg/dL — ABNORMAL HIGH (ref 70–99)
Potassium: 4.1 mmol/L (ref 3.5–5.1)
Sodium: 142 mmol/L (ref 135–145)

## 2019-01-12 LAB — GLUCOSE, CAPILLARY
GLUCOSE-CAPILLARY: 197 mg/dL — AB (ref 70–99)
Glucose-Capillary: 250 mg/dL — ABNORMAL HIGH (ref 70–99)
Glucose-Capillary: 277 mg/dL — ABNORMAL HIGH (ref 70–99)
Glucose-Capillary: 232 mg/dL — ABNORMAL HIGH (ref 70–99)

## 2019-01-12 MED ORDER — FUROSEMIDE 10 MG/ML IJ SOLN
60.0000 mg | Freq: Two times a day (BID) | INTRAMUSCULAR | Status: AC
  Administered 2019-01-12 (×2): 60 mg via INTRAVENOUS
  Filled 2019-01-12 (×2): qty 6

## 2019-01-12 MED ORDER — HYDROCODONE-ACETAMINOPHEN 5-325 MG PO TABS: 1.0000 | ORAL_TABLET | Freq: Four times a day (QID) | ORAL | Status: DC | PRN

## 2019-01-12 NOTE — Care Management Note (Signed)
Case Management Note  Patient Details  Name: Gary Odonnell MRN: [id number] Date of Birth: 11/18/1932  Subjective/Objective:        Admitted with pneumonia and COPD. From home with wife. Pt ind pta. Pt has home O2 pta. Pt recently [place] from [place]. Referral made to [facility] at that time, pt says no one has come out but per [facility], [facility] rep, pt is active.            Action/Plan: DC home with resumption of [place] services. CM will cont to follow.   Expected Discharge Date:  01/11/19               Expected Discharge Plan:  [place]  In-House Referral:  NA  Discharge planning Services  CM Consult  Post Acute Care Choice:  Home Health Choice offered to:  Patient  HH Arranged:  PT, RN [facility] Agency:  [place]  Status of Service:  In process, will continue to follow  Sherald Barge, RN 01/12/2019, 11:43 AM

## 2019-01-12 NOTE — Progress Notes (Signed)
Inpatient Diabetes Program Recommendations  AACE/ADA: New Consensus Statement on Inpatient Glycemic Control   Target Ranges:  Prepandial:   less than 140 mg/dL      Peak postprandial:   less than 180 mg/dL (1-2 hours)      Critically ill patients:  140 - 180 mg/dL  Results for Gary Odonnell, Gary Odonnell (MRN [id number]) as of 01/12/2019 10:20  Ref. Range 01/11/2019 08:04 01/11/2019 11:34 01/11/2019 16:23 01/11/2019 21:28 01/12/2019 07:52  Glucose-Capillary Latest Ref Range: 70 - 99 mg/dL 197 (H) 243 (H) 265 (H) 267 (H) 197 (H)  Results for Gary Odonnell, Gary Odonnell (MRN [id number]) as of 01/12/2019 10:20  Ref. Range 06/04/2018 11:32 01/10/2019 03:48  Hemoglobin A1C Latest Ref Range: 4.8 - 5.6 % 6.5 (H) 6.5 (H)    Review of Glycemic Control  Diabetes history: DM2 (diet controlled) Outpatient Diabetes medications: None Current orders for Inpatient glycemic control: Novolog 0-9 units TID with meals, Novolog 0-5 units QHS; Solumedrol 60 mg Q12H  Inpatient Diabetes Program Recommendations:   Insulin - Meal Coverage: Post prandial glucose is consistently elevated with steroids. If steroids are continued, please consider ordering Novolog 3 units TID with meals for meal coverage if patient eats at least 50% of meals.  Thanks, Barnie Alderman, RN, MSN, CDE Diabetes Coordinator Inpatient Diabetes Program [phone] (Team Pager from 8am to 5pm)

## 2019-01-12 NOTE — Progress Notes (Signed)
Progress Note  Patient Name: Gary Odonnell Date of Encounter: 01/12/2019  Primary Cardiologist: Carlyle Dolly, MD   Subjective   Ongoing SOB  Inpatient Medications    Scheduled Meds: . apixaban  5 mg Oral BID  . budesonide (PULMICORT) nebulizer solution  0.25 mg Nebulization BID  . carvedilol  6.25 mg Oral BID WC  . ezetimibe  10 mg Oral Daily  . fluticasone  2 spray Each Nare Daily  . guaiFENesin  1,200 mg Oral BID  . hydrALAZINE  25 mg Oral Q8H  . insulin aspart  0-5 Units Subcutaneous QHS  . insulin aspart  0-9 Units Subcutaneous TID WC  . ipratropium-albuterol  3 mL Nebulization Q6H  . methylPREDNISolone (SOLU-MEDROL) injection  60 mg Intravenous Q12H  . nicotine  14 mg Transdermal Daily  . sodium chloride flush  3 mL Intravenous Q12H  . sodium chloride flush  3 mL Intravenous Q12H   Continuous Infusions: . sodium chloride 250 mL (01/10/19 2201)   PRN Meds: sodium chloride, acetaminophen **OR** acetaminophen, albuterol, hydrALAZINE, HYDROcodone-acetaminophen, ondansetron **OR** ondansetron (ZOFRAN) IV, senna-docusate, sodium chloride, sodium chloride flush   Vital Signs    Vitals:   01/11/19 2249 01/12/19 0103 01/12/19 0508 01/12/19 0752  BP:   (!) 154/89   Pulse: 67  77   Resp:   18   Temp:   (!) 97.5 F (36.4 C)   TempSrc:   Oral   SpO2: 96% 95%  94%  Weight:   97.8 kg   Height:        Intake/Output Summary (Last 24 hours) at 01/12/2019 0841 Last data filed at 01/12/2019 0707 Gross per 24 hour  Intake -  Output 1275 ml  Net -1275 ml   Last 3 Weights 01/12/2019 01/11/2019 01/10/2019  Weight (lbs) 215 lb 9.8 oz 214 lb 1.1 oz 214 lb 1.1 oz  Weight (kg) 97.8 kg 97.1 kg 97.1 kg      Telemetry    afib - Personally Reviewed  ECG    na  Physical Exam   GEN: No acute distress.   Neck: elevated JVD Cardiac: irreg, no murmurs, rubs, or gallops.  Respiratory: Clear to auscultation bilaterally. GI: Soft, nontender, non-distended  MS: No edema;  No deformity. Neuro:  Nonfocal  Psych: Normal affect   Labs    Chemistry Recent Labs  Lab 01/05/19 1502  01/10/19 0013 01/11/19 0615 01/12/19 0549  NA 144   < > 140 140 142  K 4.4   < > 4.8 4.7 4.1  CL 105   < > 107 106 106  CO2 31   < > 27 26 28   GLUCOSE 157*   < > 214* 218* 200*  BUN 26*   < > 30* 32* 36*  CREATININE 1.28*   < > 1.63* 1.31* 1.24  CALCIUM 9.7   < > 9.3 9.4 9.2  PROT 7.1  --  6.8 6.2*  --   ALBUMIN 4.0  --  3.6 3.5  --   AST 21  --  23 15  --   ALT 46*  --  43 39  --   ALKPHOS 64  --  56 51  --   BILITOT 1.9*  --  1.5* 1.9*  --   GFRNONAA 51*   < > 38* 50* 53*  GFRAA 59*   < > 44* 58* >60  ANIONGAP 8   < > 6 8 8    < > = values in this interval not displayed.  Hematology Recent Labs  Lab 01/06/19 0553 01/10/19 0013 01/11/19 0615  WBC 5.7 8.5 10.4  RBC 4.12* 4.55 4.34  HGB 12.3* 13.4 12.6*  HCT 42.7 46.5 43.1  MCV 103.6* 102.2* 99.3  MCH 29.9 29.5 29.0  MCHC 28.8* 28.8* 29.2*  RDW 16.1* 16.6* 16.0*  PLT 117* 122* 121*    Cardiac Enzymes Recent Labs  Lab 01/10/19 0013 01/10/19 0350 01/10/19 1325 01/10/19 1752  TROPONINI 0.12* 0.09* 0.07* 0.08*   No results for input(s): TROPIPOC in the last 168 hours.   BNP Recent Labs  Lab 01/10/19 0013  BNP 2,650.0*     DDimer No results for input(s): DDIMER in the last 168 hours.   Radiology    Dg Ribs Bilateral W/chest  Result Date: 01/11/2019 CLINICAL DATA:  Left rib pain after fall. EXAM: BILATERAL RIBS AND CHEST - 4+ VIEW COMPARISON:  CT scan and radiograph of January 10, 2019. FINDINGS: Stable cardiomegaly. No pneumothorax is noted. No evidence of rib fracture is noted. Small bilateral pleural effusions are noted. Sternotomy wires are noted. IMPRESSION: No evidence of rib fracture. Small bilateral pleural effusions are noted. Stable cardiomegaly. Electronically Signed   By: Marijo Conception, M.D.   On: 01/11/2019 15:25   Ct Chest Wo Contrast  Result Date: 01/10/2019 CLINICAL DATA:   Pneumonia EXAM: CT CHEST WITHOUT CONTRAST TECHNIQUE: Multidetector CT imaging of the chest was performed following the standard protocol without IV contrast. COMPARISON:  Chest CT dated 07/04/2015. Chest x-ray dated 01/10/2019 FINDINGS: Cardiovascular: Cardiomegaly. No pericardial effusion. Diffuse coronary artery calcifications. Aortic atherosclerosis. No aortic aneurysm. Mediastinum/Nodes: No enlarged lymph nodes identified within the mediastinum or perihilar regions. Esophagus appears normal. Trachea and central bronchi are unremarkable. Lungs/Pleura: Bilateral pleural effusions, both moderate to large in size. mild associated compressive atelectasis within the lower lobes bilaterally. Mild perihilar edema. Emphysematous changes within the lung apices. Upper Abdomen: Small ascites within the upper abdomen, incompletely imaged. Limited images of the upper abdomen are otherwise unremarkable. Musculoskeletal: No acute or suspicious osseous finding. Degenerative spondylosis of the mid and lower thoracic spine, mild to moderate in degree. Chronic appearing compression deformity of the T12 vertebral body. IMPRESSION: 1. Bilateral pleural effusions, both moderate to large in size, with associated compressive atelectasis within the lower lobes bilaterally. 2. No evidence of pneumonia. 3. Emphysematous changes within the lung apices. 4. Small ascites within the upper abdomen, incompletely imaged. 5. Cardiomegaly. Aortic Atherosclerosis (ICD10-I70.0) and Emphysema (ICD10-J43.9). Electronically Signed   By: Franki Cabot M.D.   On: 01/10/2019 18:54    Cardiac Studies     Patient Profile     Gary Odonnell is a 85 y.o. male with a hx of ischemic DCM who is being seen today for the evaluation of CHF at the request of Dr  .Roderic Palau.  Assessment & Plan    1. Acute on chronic systolic HF/CAD -  history of prior CABG 2003 in [place] - cath 09/2013 showed LM 30%, LAD 100% proximal, LCX 50% ostial, RCA 100% mid  with distal vessel filling with right to right and left to right collaterals. LIMA-LAD patent, graft from [place] to OM1, OM2, PDA occluded. No PCI targets, recommendations for medical management. RHC mean PA 43, wedge 22, CI 1.74.  - 01/2018 echo LVEF 35-40%, grade II diastolic dysfunction.  12/2018 echo LVEF 30-35%, mild AS - BNP on admission 2650. CXR bibasilar opacification. CT chest bilateral pleural effusions, moderate to large. No pneumonia. Small ascites   - I/Os incomplete. Received lasix  40mg  IV x1 yesterday. Downtrend in Cr with diuresis suggesting venous congestin and CHF - medical therapy with coreg 6.25mg  bid, hydral 25mg  tid. Entresto held on admission.   - remains volume overloaded, dose IV lasix 60mg     2. COPD exacerbation - management per primary team  3. Afib - new diagnosis during 12/27/2018 ER visit - CHADS2Vasc score is (age x2, CHF, HTN, stroke x2, CAD) is 7, continue eliquis    For questions or updates, please contact [facility] Please consult [email or web address] for contact info under        Signed, Carlyle Dolly, MD  01/12/2019, 8:41 AM

## 2019-01-12 NOTE — Progress Notes (Addendum)
PROGRESS NOTE    Gary Odonnell  GUR:[id number] DOB: 1932/01/07 DOA: 01/09/2019 PCP: Glenda Chroman, MD    Brief Narrative:  85 year old male with a history of chronic combined CHF with ejection fraction 35%, COPD, atrial fibrillation on anticoagulation, hypertension and diabetes, presented to the hospital with complaints of shortness of breath, cough and wheezing.  He was recently discharged from the hospital after being treated for COPD exacerbation with pneumonia.  Initially felt better on discharge, but shortly after returning home, the following day, he began to feel more short of breath and needed to come back to the hospital.  Work-up in the emergency room indicated an elevated BNP.  CT scan of the chest showed bilateral pleural effusions.  He was admitted to the hospital for further management of CHF exacerbation.   Assessment & Plan:   Principal Problem:   Pneumonia Active Problems:   HTN (hypertension)   Chronic combined systolic and diastolic CHF (congestive heart failure) (HCC)   CAD (coronary atherosclerotic disease)   COPD with acute exacerbation (HCC)   A-fib (HCC)   Acute renal failure superimposed on stage 3 chronic kidney disease (HCC)   Diet-controlled diabetes mellitus (HCC)   CHF (congestive heart failure) ([place])   1. Acute on chronic combined CHF.  Echocardiogram repeated today and shows that EF is unchanged from 01/2018 at 35 to 40%.  He has evidence of bilateral pleural effusions.  This is likely the cause of his shortness of breath.  Cardiology following and started him on IV Lasix.  Monitor urine output.  Delene Loll was held on admission due to bump in creatinine. 2. Atrial fibrillation.  Currently rate controlled.  Continue Coreg and Eliquis. 3. Coronary artery disease.  Mildly elevated troponin likely related strain.  No evidence of ACS.  No complaints of chest pain. 4. Acute kidney injury on chronic kidney disease stage III.  Creatinine 1.6 on admission.  This is  trended down today to 1.2.  Continue to monitor in the setting of diuresis. 5. COPD.  Possibly contributing to shortness of breath.  Continue on steroids, nebulizer treatments and pulmonary hygiene. 6. Diabetes.  This is diet controlled.  Blood sugars currently elevated in the setting of steroids.  Continue on sliding scale insulin. 7. Mechanical fall.  Patient slipped while he was in the bathroom.  X-ray of ribs was unrevealing.  He is complaining of back pain, will get x-ray of the T and L-spine   DVT prophylaxis: Apixaban Code Status: DNR Family Communication: No family present, updated daughter Dr. Tamala Julian over the phone Disposition Plan: Discharge home once respiratory status has improved   Consultants:   Cardiology  Procedures:  Echo: 1. The left ventricle has moderate-severely reduced systolic function, with an ejection fraction of 30-35%. The cavity size was mildly dilated. There is moderately increased left ventricular wall thickness. Left ventricular diastolic Doppler parameters  are indeterminate.  2. The right ventricle has normal systolic function. The cavity was normal. There is no increase in right ventricular wall thickness.  3. Left atrial size was moderately dilated.  4. Trivial pericardial effusion is present.  5. The mitral valve is degenerative. Moderate thickening of the mitral valve leaflet. Moderate calcification of the mitral valve leaflet. There is moderate mitral annular calcification present.  6. The tricuspid valve is normal in structure.  7. The aortic valve is tricuspid Moderate thickening of the aortic valve Moderate calcification of the aortic valve. mild stenosis of the aortic valve.  8. The pulmonic valve was  grossly normal. Pulmonic valve regurgitation is mild by color flow Doppler.   9. Right atrial pressure is estimated at 8 mmHg.  Antimicrobials:   Vancomycin 2/16 > 2/17  Cefepime 2/16 > 2/17   Subjective: Continues to feel short of breath.   Complains of upper back pain.  Unclear if this started before or after his fall.  Objective: Vitals:   01/12/19 0752 01/12/19 1311 01/12/19 1329 01/12/19 1709  BP:  132/74  (!) 150/73  Pulse:    70  Resp:      Temp:      TempSrc:      SpO2: 94%  94%   Weight:      Height:        Intake/Output Summary (Last 24 hours) at 01/12/2019 1728 Last data filed at 01/12/2019 1700 Gross per 24 hour  Intake -  Output 1250 ml  Net -1250 ml   Filed Weights   01/10/19 2103 01/11/19 0500 01/12/19 0508  Weight: 97.1 kg 97.1 kg 97.8 kg    Examination:  General exam: Alert, awake, oriented x 3 Respiratory system: Diminished breath sounds bilaterally. Respiratory effort normal. Cardiovascular system:RRR. No murmurs, rubs, gallops. Gastrointestinal system: Abdomen is nondistended, soft and nontender. No organomegaly or masses felt. Normal bowel sounds heard. Central nervous system: Alert and oriented. No focal neurological deficits. Extremities: 2+ pitting edema bilaterally Skin: No rashes, lesions or ulcers Psychiatry: Judgement and insight appear normal. Mood & affect appropriate.     Data Reviewed: I have personally reviewed following labs and imaging studies  CBC: Recent Labs  Lab 01/06/19 0553 01/10/19 0013 01/11/19 0615  WBC 5.7 8.5 10.4  NEUTROABS  --  6.6 9.9*  HGB 12.3* 13.4 12.6*  HCT 42.7 46.5 43.1  MCV 103.6* 102.2* 99.3  PLT 117* 122* 638*   Basic Metabolic Panel: Recent Labs  Lab 01/05/19 2011  01/07/19 0859 01/08/19 1030 01/10/19 0013 01/11/19 0615 01/12/19 0549  NA  --    < > 140 137 140 140 142  K  --    < > 4.4 4.8 4.8 4.7 4.1  CL  --    < > 108 107 107 106 106  CO2  --    < > 25 24 27 26 28   GLUCOSE  --    < > 232* 275* 214* 218* 200*  BUN  --    < > 28* 25* 30* 32* 36*  CREATININE  --    < > 1.26* 1.21 1.63* 1.31* 1.24  CALCIUM  --    < > 9.4 9.1 9.3 9.4 9.2  MG 2.2  --   --   --   --   --   --    < > = values in this interval not displayed.    GFR: Estimated Creatinine Clearance: 52.9 mL/min (by C-G formula based on SCr of 1.24 mg/dL). Liver Function Tests: Recent Labs  Lab 01/10/19 0013 01/11/19 0615  AST 23 15  ALT 43 39  ALKPHOS 56 51  BILITOT 1.5* 1.9*  PROT 6.8 6.2*  ALBUMIN 3.6 3.5   No results for input(s): LIPASE, AMYLASE in the last 168 hours. No results for input(s): AMMONIA in the last 168 hours. Coagulation Profile: No results for input(s): INR, PROTIME in the last 168 hours. Cardiac Enzymes: Recent Labs  Lab 01/10/19 0013 01/10/19 0350 01/10/19 1325 01/10/19 1752  TROPONINI 0.12* 0.09* 0.07* 0.08*   BNP (last 3 results) No results for input(s): PROBNP in the last 8760 hours.  HbA1C: Recent Labs    01/10/19 0348  HGBA1C 6.5*   CBG: Recent Labs  Lab 01/11/19 1623 01/11/19 2128 01/12/19 0752 01/12/19 1119 01/12/19 1626  GLUCAP 265* 267* 197* 250* 277*   Lipid Profile: No results for input(s): CHOL, HDL, LDLCALC, TRIG, CHOLHDL, LDLDIRECT in the last 72 hours. Thyroid Function Tests: No results for input(s): TSH, T4TOTAL, FREET4, T3FREE, THYROIDAB in the last 72 hours. Anemia Panel: No results for input(s): VITAMINB12, FOLATE, FERRITIN, TIBC, IRON, RETICCTPCT in the last 72 hours. Sepsis Labs: Recent Labs  Lab 01/05/19 1859 01/05/19 2315 01/10/19 0136 01/10/19 0347 01/10/19 1754 01/11/19 0615  PROCALCITON  --   --   --   --  <0.10 <0.10  LATICACIDVEN 2.0* 1.2 1.6 2.4*  --   --     Recent Results (from the past 240 hour(s))  Blood Culture (routine x 2)     Status: None   Collection Time: 01/05/19  2:47 PM  Result Value Ref Range Status   Specimen Description   Final    BLOOD LEFT ANTECUBITAL Performed at [facility], [place] [address] [place], [place] [address]    Special Requests   Final    Blood Culture adequate volume BOTTLES DRAWN AEROBIC AND ANAEROBIC   Culture   Final    NO GROWTH 5 DAYS Performed at [facility], [place] [address],  [place], [place] [address]    Report Status 01/10/2019 FINAL  Final  Blood Culture (routine x 2)     Status: None   Collection Time: 01/05/19  3:07 PM  Result Value Ref Range Status   Specimen Description   Final    BLOOD RIGHT ANTECUBITAL Performed at [place] [address], [place], [place] [address]    Special Requests   Final    BOTTLES DRAWN AEROBIC AND ANAEROBIC Blood Culture adequate volume Performed at [place] [address], [place], [place] [address]    Culture   Final    NO GROWTH 5 DAYS Performed at [facility], [place] [address], [place], [place] [address]    Report Status 01/10/2019 FINAL  Final  Culture, sputum-assessment     Status: None   Collection Time: 01/05/19  5:31 PM  Result Value Ref Range Status   Specimen Description EXPECTORATED SPUTUM  Final   Special Requests NONE  Final   Sputum evaluation   Final    THIS SPECIMEN IS ACCEPTABLE FOR SPUTUM CULTURE Performed at [facility], [place] [address], [place], [place] [address]    Report Status 01/06/2019 FINAL  Final  Culture, respiratory     Status: None   Collection Time: 01/05/19  5:31 PM  Result Value Ref Range Status   Specimen Description   Final    EXPECTORATED SPUTUM Performed at [place] [address], [place], [place] [address]    Special Requests   Final    NONE Reflexed from [id number] Performed at [facility], [place] [address], [place], [place] [address]    Gram Stain   Final    NO WBC SEEN RARE SQUAMOUS EPITHELIAL CELLS PRESENT FEW GRAM POSITIVE COCCI IN CLUSTERS FEW GRAM POSITIVE RODS RARE GRAM NEGATIVE RODS    Culture   Final    MODERATE Consistent with normal respiratory flora. Performed at [facility], [place] [address], [place],  [address]    Report Status 01/08/2019 FINAL  Final  MRSA PCR  Screening     Status: Abnormal   Collection Time: 01/05/19  6:52 PM    Result Value Ref Range Status   MRSA by PCR POSITIVE (A) NEGATIVE Final    Comment:        The GeneXpert MRSA Assay (FDA approved for NASAL specimens only), is one component of a comprehensive MRSA colonization surveillance program. It is not intended to diagnose MRSA infection nor to guide or monitor treatment for MRSA infections. RESULT CALLED TO, READ BACK BY AND VERIFIED WITH: C.ADDISON AT 2145 ON 01/05/19 BY N.THOMPSON Performed at [facility], [place] [address] [place], [place] [address]   Culture, Urine     Status: None   Collection Time: 01/06/19 12:30 AM  Result Value Ref Range Status   Specimen Description   Final    URINE, CLEAN CATCH Performed at [facility], [place] [address], [place], [place] [address]    Special Requests   Final    NONE Performed at [facility], [place] [address] [place], [place] [address]    Culture   Final    NO GROWTH Performed at [facility], [place] [address], [place], [place] [address]    Report Status 01/07/2019 FINAL  Final  Blood culture (routine x 2)     Status: None (Preliminary result)   Collection Time: 01/10/19  1:09 AM  Result Value Ref Range Status   Specimen Description BLOOD RIGHT FOREARM  Final   Special Requests   Final    BOTTLES DRAWN AEROBIC AND ANAEROBIC Blood Culture adequate volume   Culture   Final    NO GROWTH 2 DAYS Performed at [facility], [address] [place], [place] [address]    Report Status PENDING  Incomplete  Blood culture (routine x 2)     Status: None (Preliminary result)   Collection Time: 01/10/19  1:36 AM  Result Value Ref Range Status   Specimen Description LEFT ANTECUBITAL  Final   Special Requests   Final    BOTTLES DRAWN AEROBIC AND ANAEROBIC Blood Culture adequate volume   Culture   Final    NO GROWTH 2 DAYS Performed at [facility], [address] [place], [place] [address]    Report Status PENDING  Incomplete       Radiology Studies: Dg Ribs Bilateral W/chest  Result Date: 01/11/2019 CLINICAL DATA:  Left rib pain after fall. EXAM: BILATERAL RIBS AND CHEST - 4+ VIEW COMPARISON:  CT scan and radiograph of January 10, 2019. FINDINGS: Stable cardiomegaly. No pneumothorax is noted. No evidence of rib fracture is noted. Small bilateral pleural effusions are noted. Sternotomy wires are noted. IMPRESSION: No evidence of rib fracture. Small bilateral pleural effusions are noted. Stable cardiomegaly. Electronically Signed   By: Marijo Conception, M.D.   On: 01/11/2019 15:25   Ct Chest Wo Contrast  Result Date: 01/10/2019 CLINICAL DATA:  Pneumonia EXAM: CT CHEST WITHOUT CONTRAST TECHNIQUE: Multidetector CT imaging of the chest was performed following the standard protocol without IV contrast. COMPARISON:  Chest CT dated 07/04/2015. Chest x-ray dated 01/10/2019 FINDINGS: Cardiovascular: Cardiomegaly. No pericardial effusion. Diffuse coronary artery calcifications. Aortic atherosclerosis. No aortic aneurysm. Mediastinum/Nodes: No enlarged lymph nodes identified within the mediastinum or perihilar regions. Esophagus appears normal. Trachea and central bronchi are unremarkable. Lungs/Pleura: Bilateral pleural effusions, both moderate to large in size. mild associated compressive atelectasis within the lower lobes bilaterally. Mild perihilar edema. Emphysematous changes within the lung apices. Upper Abdomen: Small ascites  within the upper abdomen, incompletely imaged. Limited images of the upper abdomen are otherwise unremarkable. Musculoskeletal: No acute or suspicious osseous finding. Degenerative spondylosis of the mid and lower thoracic spine, mild to moderate in degree. Chronic appearing compression deformity of the T12 vertebral body. IMPRESSION: 1. Bilateral pleural effusions, both moderate to large in size, with associated compressive atelectasis within the lower lobes bilaterally. 2. No evidence of pneumonia. 3.  Emphysematous changes within the lung apices. 4. Small ascites within the upper abdomen, incompletely imaged. 5. Cardiomegaly. Aortic Atherosclerosis (ICD10-I70.0) and Emphysema (ICD10-J43.9). Electronically Signed   By: Franki Cabot M.D.   On: 01/10/2019 18:54        Scheduled Meds: . apixaban  5 mg Oral BID  . budesonide (PULMICORT) nebulizer solution  0.25 mg Nebulization BID  . carvedilol  6.25 mg Oral BID WC  . ezetimibe  10 mg Oral Daily  . fluticasone  2 spray Each Nare Daily  . guaiFENesin  1,200 mg Oral BID  . hydrALAZINE  25 mg Oral Q8H  . insulin aspart  0-5 Units Subcutaneous QHS  . insulin aspart  0-9 Units Subcutaneous TID WC  . ipratropium-albuterol  3 mL Nebulization Q6H  . methylPREDNISolone (SOLU-MEDROL) injection  60 mg Intravenous Q12H  . nicotine  14 mg Transdermal Daily  . sodium chloride flush  3 mL Intravenous Q12H  . sodium chloride flush  3 mL Intravenous Q12H   Continuous Infusions: . sodium chloride 250 mL (01/10/19 2201)     LOS: 1 day    Time spent: 49mins    Kathie Dike, MD [facility]   If 7PM-7AM, please contact night-coverage [email or web address]  01/12/2019, 5:28 PM

## 2019-01-13 ENCOUNTER — Inpatient Hospital Stay (HOSPITAL_COMMUNITY): Payer: Medicare Other

## 2019-01-13 LAB — BASIC METABOLIC PANEL
Anion gap: 8 (ref 5–15)
BUN: 35 mg/dL — ABNORMAL HIGH (ref 8–23)
CALCIUM: 9.3 mg/dL (ref 8.9–10.3)
CO2: 30 mmol/L (ref 22–32)
Chloride: 104 mmol/L (ref 98–111)
Creatinine, Ser: 1.06 mg/dL (ref 0.61–1.24)
GFR calc Af Amer: >60 mL/min (ref >60)
GFR calc non Af Amer: >60 mL/min (ref >60)
Glucose, Bld: 192 mg/dL — ABNORMAL HIGH (ref 70–99)
Potassium: 4 mmol/L (ref 3.5–5.1)
Sodium: 142 mmol/L (ref 135–145)

## 2019-01-13 LAB — GLUCOSE, CAPILLARY
Glucose-Capillary: 185 mg/dL — ABNORMAL HIGH (ref 70–99)
Glucose-Capillary: 237 mg/dL — ABNORMAL HIGH (ref 70–99)
Glucose-Capillary: 247 mg/dL — ABNORMAL HIGH (ref 70–99)
Glucose-Capillary: 252 mg/dL — ABNORMAL HIGH (ref 70–99)

## 2019-01-13 MED ORDER — FUROSEMIDE 10 MG/ML IJ SOLN
60.0000 mg | Freq: Two times a day (BID) | INTRAMUSCULAR | Status: DC
  Administered 2019-01-13 – 2019-01-15 (×6): 60 mg via INTRAVENOUS
  Filled 2019-01-13 (×6): qty 6

## 2019-01-13 MED ORDER — SACUBITRIL-VALSARTAN 24-26 MG PO TABS
1.0000 | ORAL_TABLET | Freq: Two times a day (BID) | ORAL | Status: DC
  Administered 2019-01-13 – 2019-01-14 (×3): 1 via ORAL
  Filled 2019-01-13 (×3): qty 1

## 2019-01-13 NOTE — Progress Notes (Signed)
Progress Note  Patient Name: Gary Odonnell Date of Encounter: 01/13/2019  Primary Cardiologist: Carlyle Dolly, MD   Subjective   SOB improving.   Inpatient Medications    Scheduled Meds: . apixaban  5 mg Oral BID  . budesonide (PULMICORT) nebulizer solution  0.25 mg Nebulization BID  . carvedilol  6.25 mg Oral BID WC  . ezetimibe  10 mg Oral Daily  . fluticasone  2 spray Each Nare Daily  . furosemide  60 mg Intravenous BID  . guaiFENesin  1,200 mg Oral BID  . hydrALAZINE  25 mg Oral Q8H  . insulin aspart  0-5 Units Subcutaneous QHS  . insulin aspart  0-9 Units Subcutaneous TID WC  . ipratropium-albuterol  3 mL Nebulization Q6H  . methylPREDNISolone (SOLU-MEDROL) injection  60 mg Intravenous Q12H  . nicotine  14 mg Transdermal Daily  . sacubitril-valsartan  1 tablet Oral BID  . sodium chloride flush  3 mL Intravenous Q12H  . sodium chloride flush  3 mL Intravenous Q12H   Continuous Infusions: . sodium chloride 250 mL (01/10/19 2201)   PRN Meds: sodium chloride, acetaminophen **OR** acetaminophen, albuterol, hydrALAZINE, HYDROcodone-acetaminophen, ondansetron **OR** ondansetron (ZOFRAN) IV, senna-docusate, sodium chloride, sodium chloride flush   Vital Signs    Vitals:   01/12/19 2030 01/12/19 2031 01/12/19 2153 01/13/19 0316  BP:   125/60 (!) 157/66  Pulse:  (!) 118 86 61  Resp:  20 20 18   Temp:   97.7 F (36.5 C) (!) 97.5 F (36.4 C)  TempSrc:   Oral Oral  SpO2: 90% 90% 100% 100%  Weight:    94.6 kg  Height:        Intake/Output Summary (Last 24 hours) at 01/13/2019 1004 Last data filed at 01/12/2019 1700 Gross per 24 hour  Intake 120 ml  Output 500 ml  Net -380 ml   Last 3 Weights 01/13/2019 01/12/2019 01/11/2019  Weight (lbs) 208 lb 8.9 oz 215 lb 9.8 oz 214 lb 1.1 oz  Weight (kg) 94.6 kg 97.8 kg 97.1 kg      Telemetry    Rate controlled afib- Personally Reviewed  ECG    na  Physical Exam   GEN: No acute distress.   Neck: elevated  jvd Cardiac: irreg, no murmurs, rubs, or gallops.  Respiratory: crackles bilateral bases GI: Soft, nontender, non-distended  MS: No edema; No deformity. Neuro:  Nonfocal  Psych: Normal affect   Labs    Chemistry Recent Labs  Lab 01/10/19 0013 01/11/19 0615 01/12/19 0549 01/13/19 0524  NA 140 140 142 142  K 4.8 4.7 4.1 4.0  CL 107 106 106 104  CO2 27 26 28 30   GLUCOSE 214* 218* 200* 192*  BUN 30* 32* 36* 35*  CREATININE 1.63* 1.31* 1.24 1.06  CALCIUM 9.3 9.4 9.2 9.3  PROT 6.8 6.2*  --   --   ALBUMIN 3.6 3.5  --   --   AST 23 15  --   --   ALT 43 39  --   --   ALKPHOS 56 51  --   --   BILITOT 1.5* 1.9*  --   --   GFRNONAA 38* 50* 53* >60  GFRAA 44* 58* >60 >60  ANIONGAP 6 8 8 8      Hematology Recent Labs  Lab 01/10/19 0013 01/11/19 0615  WBC 8.5 10.4  RBC 4.55 4.34  HGB 13.4 12.6*  HCT 46.5 43.1  MCV 102.2* 99.3  MCH 29.5 29.0  MCHC 28.8*  29.2*  RDW 16.6* 16.0*  PLT 122* 121*    Cardiac Enzymes Recent Labs  Lab 01/10/19 0013 01/10/19 0350 01/10/19 1325 01/10/19 1752  TROPONINI 0.12* 0.09* 0.07* 0.08*   No results for input(s): TROPIPOC in the last 168 hours.   BNP Recent Labs  Lab 01/10/19 0013  BNP 2,650.0*     DDimer No results for input(s): DDIMER in the last 168 hours.   Radiology    Dg Ribs Bilateral W/chest  Result Date: 01/11/2019 CLINICAL DATA:  Left rib pain after fall. EXAM: BILATERAL RIBS AND CHEST - 4+ VIEW COMPARISON:  CT scan and radiograph of January 10, 2019. FINDINGS: Stable cardiomegaly. No pneumothorax is noted. No evidence of rib fracture is noted. Small bilateral pleural effusions are noted. Sternotomy wires are noted. IMPRESSION: No evidence of rib fracture. Small bilateral pleural effusions are noted. Stable cardiomegaly. Electronically Signed   By: Marijo Conception, M.D.   On: 01/11/2019 15:25    Cardiac Studies     Patient Profile     Zeek Rostron Juvrudis a 85 y.o.malewith a hx of ischemic DCMwho is being seen  today for the evaluation of CHFat the request of Dr .Roderic Palau.  Assessment & Plan     1. Acute on chronic systolic HF/CAD - history of prior CABG 2003 in [place] - cath 09/2013 showed LM 30%, LAD 100% proximal, LCX 50% ostial, RCA 100% mid with distal vessel filling with right to right and left to right collaterals. LIMA-LAD patent, graft from Clinch to OM1, OM2, PDA occluded. No PCI targets, recommendations for medical management. RHC mean PA 43, wedge 22, CI 1.74.  - 01/2018 echo LVEF 35-40%, grade II diastolic dysfunction.  12/2018 echo LVEF 30-35%, mild AS - BNP on admission 2650. CXR bibasilar opacification. CT chest bilateral pleural effusions, moderate to large. No pneumonia. Small ascites  I/Os not recorded by overnight nursing staff. Yesterday he received IV lasix 60mg  bid, downtrend in Cr with diuresis consistent with venous congestion and CHF. 97.8 to 94.6 kg weight today if accurate. Symptoms are improving - remains volume overloaded, redose IV lasix 60mg  bid today.  - Continue coreg 6.25mg  bid, restart his entresto however at lower dose due to initial AKI. As titrate back up entresto likely will be able to come off hydralazine.   2. COPD exacerbation - management per primary team  3. Afib - new diagnosis during 12/27/2018 ER visit - CHADS2Vasc score is (age x2, CHF, HTN, stroke x2, CAD) is 7, continue eliquis  For questions or updates, please contact [facility] Please consult [email or web address] for contact info under        Signed, Carlyle Dolly, MD  01/13/2019, 10:04 AM

## 2019-01-13 NOTE — Progress Notes (Signed)
PROGRESS NOTE    Gary Odonnell  VQQ:[id number] DOB: 11/06/1932 DOA: 01/09/2019 PCP: Glenda Chroman, MD    Brief Narrative:  85 year old male with a history of chronic combined CHF with ejection fraction 35%, COPD, atrial fibrillation on anticoagulation, hypertension and diabetes, presented to the hospital with complaints of shortness of breath, cough and wheezing.  He was recently discharged from the hospital after being treated for COPD exacerbation with pneumonia.  Initially felt better on discharge, but shortly after returning home, the following day, he began to feel more short of breath and needed to come back to the hospital.  Work-up in the emergency room indicated an elevated BNP.  CT scan of the chest showed bilateral pleural effusions.  He was admitted to the hospital for further management of CHF exacerbation.   Assessment & Plan:   Principal Problem:   Pneumonia Active Problems:   HTN (hypertension)   Chronic combined systolic and diastolic CHF (congestive heart failure) (HCC)   CAD (coronary atherosclerotic disease)   COPD with acute exacerbation (HCC)   A-fib (HCC)   Acute renal failure superimposed on stage 3 chronic kidney disease (HCC)   Diet-controlled diabetes mellitus (HCC)   CHF (congestive heart failure) ([place])   1. Acute on chronic combined CHF.  Echocardiogram repeated today and shows that EF is unchanged from 01/2018 at 35 to 40%.  He has evidence of bilateral pleural effusions.  This is likely the cause of his shortness of breath.  Cardiology following and he is currently on IV Lasix.  Monitor urine output.  Entresto being restarted at lower dose. 2. Atrial fibrillation.  Currently rate controlled.  Continue Coreg and Eliquis. 3. Coronary artery disease.  Mildly elevated troponin likely related strain.  No evidence of ACS.  No complaints of chest pain. 4. Acute kidney injury on chronic kidney disease stage III.  Creatinine 1.6 on admission.  This is trended down  today to 1.0.  Continue to monitor in the setting of diuresis. 5. COPD.  Possibly contributing to shortness of breath.  Currently on Solu-Medrol, nebulizer treatments and pulmonary hygiene.  If breathing continues to improve, consider de-escalating steroids tomorrow 6. Diabetes.  This is diet controlled.  Blood sugars currently elevated in the setting of steroids.  Continue on sliding scale insulin. 7. Mechanical fall.  Patient slipped while he was in the bathroom.  X-ray of ribs was unrevealing.  He also complained of back pain and T-L-spine x-rays were obtained.  This confirmed remote T12 compression fracture.  Patient reports he has had this fracture for approximately 20 years.   DVT prophylaxis: Apixaban Code Status: DNR Family Communication: Discussed with wife and daughter at the bedside Disposition Plan: Discharge home once respiratory status has improved   Consultants:   Cardiology  Procedures:  Echo: 1. The left ventricle has moderate-severely reduced systolic function, with an ejection fraction of 30-35%. The cavity size was mildly dilated. There is moderately increased left ventricular wall thickness. Left ventricular diastolic Doppler parameters  are indeterminate.  2. The right ventricle has normal systolic function. The cavity was normal. There is no increase in right ventricular wall thickness.  3. Left atrial size was moderately dilated.  4. Trivial pericardial effusion is present.  5. The mitral valve is degenerative. Moderate thickening of the mitral valve leaflet. Moderate calcification of the mitral valve leaflet. There is moderate mitral annular calcification present.  6. The tricuspid valve is normal in structure.  7. The aortic valve is tricuspid Moderate thickening of  the aortic valve Moderate calcification of the aortic valve. mild stenosis of the aortic valve.  8. The pulmonic valve was grossly normal. Pulmonic valve regurgitation is mild by color flow Doppler.    9. Right atrial pressure is estimated at 8 mmHg.  Antimicrobials:   Vancomycin 2/16 > 2/17  Cefepime 2/16 > 2/17   Subjective: Feels her breathing is mildly improved, although still short of breath.  He did complain of some abdominal pressure and felt like he had difficulty passing his urine.  Bladder scan was obtained and he only had 200 cc of urine.  Later on in the day, patient began to urinate significantly and denied any further pressure in his abdomen.  Objective: Vitals:   01/12/19 2153 01/13/19 0316 01/13/19 1344 01/13/19 1421  BP: 125/60 (!) 157/66 (!) 142/51   Pulse: 86 61 100   Resp: 20 18 18    Temp: 97.7 F (36.5 C) (!) 97.5 F (36.4 C) 98.5 F (36.9 C)   TempSrc: Oral Oral Oral   SpO2: 100% 100% 100% 98%  Weight:  94.6 kg    Height:        Intake/Output Summary (Last 24 hours) at 01/13/2019 1844 Last data filed at 01/13/2019 1620 Gross per 24 hour  Intake 600 ml  Output 850 ml  Net -250 ml   Filed Weights   01/11/19 0500 01/12/19 0508 01/13/19 0316  Weight: 97.1 kg 97.8 kg 94.6 kg    Examination:  General exam: Alert, awake, oriented x 3 Respiratory system: Diminished breath sounds bilaterally with crackles at bases. Respiratory effort normal. Cardiovascular system:RRR. No murmurs, rubs, gallops. Gastrointestinal system: Abdomen is nondistended, soft and nontender. No organomegaly or masses felt. Normal bowel sounds heard. Central nervous system: Alert and oriented. No focal neurological deficits. Extremities: 1+ edema  Skin: No rashes, lesions or ulcers Psychiatry: Judgement and insight appear normal. Mood & affect appropriate.    Data Reviewed: I have personally reviewed following labs and imaging studies  CBC: Recent Labs  Lab 01/10/19 0013 01/11/19 0615  WBC 8.5 10.4  NEUTROABS 6.6 9.9*  HGB 13.4 12.6*  HCT 46.5 43.1  MCV 102.2* 99.3  PLT 122* 459*   Basic Metabolic Panel: Recent Labs  Lab 01/08/19 1030 01/10/19 0013 01/11/19 0615  01/12/19 0549 01/13/19 0524  NA 137 140 140 142 142  K 4.8 4.8 4.7 4.1 4.0  CL 107 107 106 106 104  CO2 24 27 26 28 30   GLUCOSE 275* 214* 218* 200* 192*  BUN 25* 30* 32* 36* 35*  CREATININE 1.21 1.63* 1.31* 1.24 1.06  CALCIUM 9.1 9.3 9.4 9.2 9.3   GFR: Estimated Creatinine Clearance: 60.9 mL/min (by C-G formula based on SCr of 1.06 mg/dL). Liver Function Tests: Recent Labs  Lab 01/10/19 0013 01/11/19 0615  AST 23 15  ALT 43 39  ALKPHOS 56 51  BILITOT 1.5* 1.9*  PROT 6.8 6.2*  ALBUMIN 3.6 3.5   No results for input(s): LIPASE, AMYLASE in the last 168 hours. No results for input(s): AMMONIA in the last 168 hours. Coagulation Profile: No results for input(s): INR, PROTIME in the last 168 hours. Cardiac Enzymes: Recent Labs  Lab 01/10/19 0013 01/10/19 0350 01/10/19 1325 01/10/19 1752  TROPONINI 0.12* 0.09* 0.07* 0.08*   BNP (last 3 results) No results for input(s): PROBNP in the last 8760 hours. HbA1C: No results for input(s): HGBA1C in the last 72 hours. CBG: Recent Labs  Lab 01/12/19 1626 01/12/19 2150 01/13/19 9774 01/13/19 1104 01/13/19 1632  GLUCAP 277* 232* 185* 237* 247*   Lipid Profile: No results for input(s): CHOL, HDL, LDLCALC, TRIG, CHOLHDL, LDLDIRECT in the last 72 hours. Thyroid Function Tests: No results for input(s): TSH, T4TOTAL, FREET4, T3FREE, THYROIDAB in the last 72 hours. Anemia Panel: No results for input(s): VITAMINB12, FOLATE, FERRITIN, TIBC, IRON, RETICCTPCT in the last 72 hours. Sepsis Labs: Recent Labs  Lab 01/10/19 0136 01/10/19 0347 01/10/19 1754 01/11/19 0615  PROCALCITON  --   --  <0.10 <0.10  LATICACIDVEN 1.6 2.4*  --   --     Recent Results (from the past 240 hour(s))  Blood Culture (routine x 2)     Status: None   Collection Time: 01/05/19  2:47 PM  Result Value Ref Range Status   Specimen Description   Final    BLOOD LEFT ANTECUBITAL Performed at [facility], [place] [address]  [place], [place] [address]    Special Requests   Final    Blood Culture adequate volume BOTTLES DRAWN AEROBIC AND ANAEROBIC   Culture   Final    NO GROWTH 5 DAYS Performed at [facility], [place] [address], [place], [place] [address]    Report Status 01/10/2019 FINAL  Final  Blood Culture (routine x 2)     Status: None   Collection Time: 01/05/19  3:07 PM  Result Value Ref Range Status   Specimen Description   Final    BLOOD RIGHT ANTECUBITAL Performed at [place] [address], [place], [place] [address]    Special Requests   Final    BOTTLES DRAWN AEROBIC AND ANAEROBIC Blood Culture adequate volume Performed at [place] [address] [place], [place] [address]    Culture   Final    NO GROWTH 5 DAYS Performed at [facility], [place] [address] [place], [place] [address]    Report Status 01/10/2019 FINAL  Final  Culture, sputum-assessment     Status: None   Collection Time: 01/05/19  5:31 PM  Result Value Ref Range Status   Specimen Description EXPECTORATED SPUTUM  Final   Special Requests NONE  Final   Sputum evaluation   Final    THIS SPECIMEN IS ACCEPTABLE FOR SPUTUM CULTURE Performed at [facility], [place] [address] [place], [place] [address]    Report Status 01/06/2019 FINAL  Final  Culture, respiratory     Status: None   Collection Time: 01/05/19  5:31 PM  Result Value Ref Range Status   Specimen Description   Final    EXPECTORATED SPUTUM Performed at [place] [address], [place], [place] [address]    Special Requests   Final    NONE Reflexed from [id number] Performed at [facility], [place] [address], [place], [place] [address]    Gram Stain   Final    NO WBC SEEN RARE SQUAMOUS EPITHELIAL CELLS PRESENT FEW GRAM POSITIVE COCCI IN CLUSTERS FEW GRAM POSITIVE RODS RARE GRAM NEGATIVE RODS    Culture   Final    MODERATE Consistent with normal  respiratory flora. Performed at [facility], [place] [address], [place], [place] [address]    Report Status 01/08/2019 FINAL  Final  MRSA PCR Screening     Status: Abnormal   Collection Time: 01/05/19  6:52 PM  Result Value Ref Range Status   MRSA by PCR POSITIVE (A) NEGATIVE Final    Comment:  The GeneXpert MRSA Assay (FDA approved for NASAL specimens only), is one component of a comprehensive MRSA colonization surveillance program. It is not intended to diagnose MRSA infection nor to guide or monitor treatment for MRSA infections. RESULT CALLED TO, READ BACK BY AND VERIFIED WITH: C.ADDISON AT 2145 ON 01/05/19 BY N.THOMPSON Performed at [facility], [place] [address], [place], [place] [address]   Culture, Urine     Status: None   Collection Time: 01/06/19 12:30 AM  Result Value Ref Range Status   Specimen Description   Final    URINE, CLEAN CATCH Performed at [facility], [place] [address], [place], [place] [address]    Special Requests   Final    NONE Performed at [facility], [place] [address] [place], [place] [address]    Culture   Final    NO GROWTH Performed at [facility], [place] [address], [place], [place] [address]    Report Status 01/07/2019 FINAL  Final  Blood culture (routine x 2)     Status: None (Preliminary result)   Collection Time: 01/10/19  1:09 AM  Result Value Ref Range Status   Specimen Description BLOOD RIGHT FOREARM  Final   Special Requests   Final    BOTTLES DRAWN AEROBIC AND ANAEROBIC Blood Culture adequate volume   Culture   Final    NO GROWTH 3 DAYS Performed at [facility] [address], [place], [place] [address]    Report Status PENDING  Incomplete  Blood culture (routine x 2)     Status: None (Preliminary result)   Collection Time: 01/10/19  1:36 AM  Result Value Ref Range Status   Specimen Description LEFT ANTECUBITAL  Final   Special Requests   Final    BOTTLES  DRAWN AEROBIC AND ANAEROBIC Blood Culture adequate volume   Culture   Final    NO GROWTH 3 DAYS Performed at [facility], [address], [place], [place] [address]    Report Status PENDING  Incomplete      Radiology Studies: Dg Thoracic Spine W/swimmers  Result Date: 01/13/2019 CLINICAL DATA:  Back pain due to fall EXAM: THORACIC SPINE - 3 VIEWS COMPARISON:  Chest CT from 2 days ago FINDINGS: No evidence of acute fracture. Remote T12 compression fracture with advanced height loss. Pleural fluid that is partially visualized, known from preceding chest CT. Generalized spondylosis and disc narrowing typical of age IMPRESSION: 1. Remote T12 compression fracture. 2. No acute finding. Electronically Signed   By: Monte Fantasia M.D.   On: 01/13/2019 10:48   Dg Lumbar Spine 2-3 Views  Result Date: 01/13/2019 CLINICAL DATA:  Back pain EXAM: LUMBAR SPINE - 2-3 VIEW COMPARISON:  03/12/2018 FINDINGS: Remote T12 compression fracture as described on dedicated study. No acute fracture is seen in the lumbar spine. Generalized disc narrowing. Lower lumbar degenerative facet spurring. IMPRESSION: 1. No acute finding. 2. Remote T12 compression fracture. Electronically Signed   By: Monte Fantasia M.D.   On: 01/13/2019 10:50        Scheduled Meds: . apixaban  5 mg Oral BID  . budesonide (PULMICORT) nebulizer solution  0.25 mg Nebulization BID  . carvedilol  6.25 mg Oral BID WC  . ezetimibe  10 mg Oral Daily  . fluticasone  2 spray Each Nare Daily  . furosemide  60 mg Intravenous BID  . guaiFENesin  1,200 mg Oral BID  . hydrALAZINE  25 mg Oral Q8H  . insulin  aspart  0-5 Units Subcutaneous QHS  . insulin aspart  0-9 Units Subcutaneous TID WC  . ipratropium-albuterol  3 mL Nebulization Q6H  . methylPREDNISolone (SOLU-MEDROL) injection  60 mg Intravenous Q12H  . nicotine  14 mg Transdermal Daily  . sacubitril-valsartan  1 tablet Oral BID  . sodium chloride flush  3 mL Intravenous Q12H  . sodium  chloride flush  3 mL Intravenous Q12H   Continuous Infusions: . sodium chloride 250 mL (01/10/19 2201)     LOS: 2 days    Time spent: 89mins    Kathie Dike, MD [facility]   If 7PM-7AM, please contact night-coverage [email or web address]  01/13/2019, 6:44 PM

## 2019-01-13 NOTE — Progress Notes (Signed)
Inpatient Diabetes Program Recommendations  AACE/ADA: New Consensus Statement on Inpatient Glycemic Control (2015)  Target Ranges:  Prepandial:   less than 140 mg/dL      Peak postprandial:   less than 180 mg/dL (1-2 hours)      Critically ill patients:  140 - 180 mg/dL   Lab Results  Component Value Date   AYOKHT 977 (H) 01/13/2019   HGBA1C 6.5 (H) 01/10/2019    Review of Glycemic Control  Diabetes history: DM2 (diet controlled) Outpatient Diabetes medications: None Current orders for Inpatient glycemic control: Novolog 0-9 units TID with meals, Novolog 0-5 units QHS; Solumedrol 60 mg Q12H  Inpatient Diabetes Program Recommendations:   Insulin - Meal Coverage: Post prandial glucose is consistently elevated with steroids. If steroids are continued, please consider ordering Novolog 3 units TID with meals for meal coverage if patient eats at least 50% of meals.   Thank you, Nani Gasser. Kent Braunschweig, RN, MSN, CDE  Diabetes Coordinator Inpatient Glycemic Control Team Team Pager [phone] (8am-5pm) 01/13/2019 12:40 PM

## 2019-01-14 DIAGNOSIS — J181 Lobar pneumonia, unspecified organism: Secondary | ICD-10-CM

## 2019-01-14 DIAGNOSIS — N183 Chronic kidney disease, stage 3 (moderate): Secondary | ICD-10-CM

## 2019-01-14 DIAGNOSIS — N179 Acute kidney failure, unspecified: Secondary | ICD-10-CM

## 2019-01-14 DIAGNOSIS — I5042 Chronic combined systolic (congestive) and diastolic (congestive) heart failure: Secondary | ICD-10-CM

## 2019-01-14 DIAGNOSIS — I5023 Acute on chronic systolic (congestive) heart failure: Secondary | ICD-10-CM | POA: Diagnosis present

## 2019-01-14 DIAGNOSIS — E119 Type 2 diabetes mellitus without complications: Secondary | ICD-10-CM

## 2019-01-14 LAB — BASIC METABOLIC PANEL
Anion gap: 11 (ref 5–15)
BUN: 32 mg/dL — AB (ref 8–23)
CO2: 31 mmol/L (ref 22–32)
Calcium: 9 mg/dL (ref 8.9–10.3)
Chloride: 99 mmol/L (ref 98–111)
Creatinine, Ser: 1.08 mg/dL (ref 0.61–1.24)
GFR calc Af Amer: >60 mL/min (ref >60)
GFR calc non Af Amer: >60 mL/min (ref >60)
Glucose, Bld: 213 mg/dL — ABNORMAL HIGH (ref 70–99)
Potassium: 3.9 mmol/L (ref 3.5–5.1)
Sodium: 141 mmol/L (ref 135–145)

## 2019-01-14 LAB — GLUCOSE, CAPILLARY
Glucose-Capillary: 235 mg/dL — ABNORMAL HIGH (ref 70–99)
Glucose-Capillary: 186 mg/dL — ABNORMAL HIGH (ref 70–99)
Glucose-Capillary: 165 mg/dL — ABNORMAL HIGH (ref 70–99)
Glucose-Capillary: 165 mg/dL — ABNORMAL HIGH (ref 70–99)

## 2019-01-14 MED ORDER — INSULIN ASPART 100 UNIT/ML ~~LOC~~ SOLN
5.0000 [IU] | Freq: Three times a day (TID) | SUBCUTANEOUS | Status: DC
  Administered 2019-01-14 – 2019-01-16 (×7): 5 [IU] via SUBCUTANEOUS

## 2019-01-14 MED ORDER — IPRATROPIUM-ALBUTEROL 0.5-2.5 (3) MG/3ML IN SOLN
3.0000 mL | Freq: Four times a day (QID) | RESPIRATORY_TRACT | Status: DC
  Administered 2019-01-15 – 2019-01-18 (×11): 3 mL via RESPIRATORY_TRACT
  Filled 2019-01-14 (×11): qty 3

## 2019-01-14 MED ORDER — SACUBITRIL-VALSARTAN 49-51 MG PO TABS
1.0000 | ORAL_TABLET | Freq: Two times a day (BID) | ORAL | Status: DC
  Administered 2019-01-14 – 2019-01-15 (×3): 1 via ORAL
  Filled 2019-01-14 (×3): qty 1

## 2019-01-14 MED ORDER — METHYLPREDNISOLONE SODIUM SUCC 40 MG IJ SOLR
40.0000 mg | Freq: Two times a day (BID) | INTRAMUSCULAR | Status: DC
  Administered 2019-01-14 – 2019-01-15 (×2): 40 mg via INTRAVENOUS
  Filled 2019-01-14 (×2): qty 1

## 2019-01-14 NOTE — Evaluation (Signed)
Physical Therapy Evaluation Patient Details Name: BRAXTEN MEMMER MRN: [id number] DOB: 11-15-1932 Today's Date: 01/14/2019   History of Present Illness  JEZREEL JUSTINIANO is a 85 y.o. male with medical history significant for coronary artery disease, type 2 diabetes mellitus, hypertension, atrial fibrillation on Eliquis, COPD, and chronic combined systolic and diastolic CHF, now presenting to the emergency department for evaluation of increasing shortness of breath, cough, and wheezing.  Patient was just discharged from the hospital after treatment for pneumonia with Rocephin and azithromycin.  Patient developed a productive cough and increased shortness of breath approximately 2 weeks ago, was started on Augmentin in the outpatient setting, continued to worsen and developed significant wheezing, and was admitted to the hospital on 01/05/2019, started on Rocephin and azithromycin, and was discharged with oxygen on 01/08/2019 in improved condition.  Unfortunately, over the past 24 hours or so, his shortness of breath has worsened again and he has begun to wheeze again as well.  He denies any lower extremity edema, denies any chest pain, and has not noted any fevers or chills.    Clinical Impression  Patient functioning near baseline for functional mobility and gait, demonstrates slow labored movement for sitting up at bedside, transfers and ambulating in room using [facility], patient states he cannot use a RW or wheelchair at home due to not enough room.  Patient demonstrates good return for using SPC, but limited secondary to c/o fatigue while on room air with O2 desaturation down to 83%, put back on 4 LPM - RN notified.  Patient tolerated sitting up in chair after therapy.  Patient will benefit from continued physical therapy in hospital and recommended venue below to increase strength, balance, endurance for safe ADLs and gait.     Follow Up Recommendations Home health PT;Supervision for mobility/OOB;Supervision  - Intermittent    Equipment Recommendations  None recommended by PT    Recommendations for Other Services       Precautions / Restrictions Precautions Precautions: Fall Precaution Comments: monitior O2 Restrictions Weight Bearing Restrictions: No      Mobility  Bed Mobility Overal bed mobility: Needs Assistance Bed Mobility: Supine to Sit     Supine to sit: Supervision     General bed mobility comments: increased time, labored movement  Transfers Overall transfer level: Needs assistance Equipment used: Straight cane Transfers: Sit to/from Stand;Stand Pivot Transfers Sit to Stand: Min guard Stand pivot transfers: Min guard       General transfer comment: slow labored movement  Ambulation/Gait Ambulation/Gait assistance: Min guard Gait Distance (Feet): 20 Feet Assistive device: Straight cane Gait Pattern/deviations: Step-through pattern;Decreased step length - right;Decreased step length - left;Decreased stride length Gait velocity: decreased   General Gait Details: slow labored cadence without loss of balance with mostly 2-point gait pattern, limited secondary to c/o fatigue  Stairs            Wheelchair Mobility    Modified Rankin (Stroke Patients Only)       Balance Overall balance assessment: Needs assistance Sitting-balance support: Feet supported;No upper extremity supported Sitting balance-Leahy Scale: Good     Standing balance support: Single extremity supported;During functional activity Standing balance-Leahy Scale: Fair Standing balance comment: using SPC                             Pertinent Vitals/Pain Pain Assessment: No/denies pain    Home Living Family/patient expects to be discharged to:: Private residence Living Arrangements: Spouse/significant other  Available Help at Discharge: Family;Available 24 hours/day Type of Home: House Home Access: Stairs to enter Entrance Stairs-Rails: Right;Can reach  both;Left Entrance Stairs-Number of Steps: 5 in the front, 9 in the back Home Layout: One level Home Equipment: Walker - 2 wheels;Cane - single point;Shower seat - built in      Prior Function Level of Independence: Independent with assistive device(s)         Comments: household ambulator using SPC or leaning on furniture     Hand Dominance   Dominant Hand: Right    Extremity/Trunk Assessment   Upper Extremity Assessment Upper Extremity Assessment: Generalized weakness    Lower Extremity Assessment Lower Extremity Assessment: Generalized weakness    Cervical / Trunk Assessment Cervical / Trunk Assessment: Kyphotic  Communication   Communication: No difficulties  Cognition Arousal/Alertness: Awake/alert Behavior During Therapy: WFL for tasks assessed/performed Overall Cognitive Status: Within Functional Limits for tasks assessed                                        General Comments      Exercises     Assessment/Plan    PT Assessment Patient needs continued PT services  PT Problem List Decreased strength;Decreased balance;Decreased mobility;Decreased activity tolerance;Cardiopulmonary status limiting activity       PT Treatment Interventions Gait training;Functional mobility training;Stair training;Therapeutic activities;Patient/family education;Therapeutic exercise    PT Goals (Current goals can be found in the Care Plan section)  Acute Rehab PT Goals Patient Stated Goal: return home with family to assist PT Goal Formulation: With patient Time For Goal Achievement: 01/18/19 Potential to Achieve Goals: Good    Frequency Min 3X/week   Barriers to discharge        Co-evaluation               AM-PAC PT "6 Clicks" Mobility  Outcome Measure Help needed turning from your back to your side while in a flat bed without using bedrails?: None Help needed moving from lying on your back to sitting on the side of a flat bed without  using bedrails?: None Help needed moving to and from a bed to a chair (including a wheelchair)?: A Little Help needed standing up from a chair using your arms (e.g., wheelchair or bedside chair)?: A Little Help needed to walk in hospital room?: A Little Help needed climbing 3-5 steps with a railing? : A Lot 6 Click Score: 19    End of Session Equipment Utilized During Treatment: Gait belt;Oxygen Activity Tolerance: Patient tolerated treatment well;Patient limited by fatigue Patient left: in chair;with call bell/phone within reach Nurse Communication: Mobility status PT Visit Diagnosis: Unsteadiness on feet (R26.81);Muscle weakness (generalized) (M62.81);Difficulty in walking, not elsewhere classified (R26.2)    Time: 1137-1208 PT Time Calculation (min) (ACUTE ONLY): 31 min   Charges:   PT Evaluation $PT Eval Moderate Complexity: 1 Mod PT Treatments $Therapeutic Activity: 23-37 mins        2:15 PM, 01/14/19 Lonell Grandchild, MPT Physical Therapist with [facility] [phone] office [phone] mobile phone

## 2019-01-14 NOTE — Progress Notes (Signed)
PROGRESS NOTE    Gary Odonnell  YKD:[id number] DOB: Feb 12, 1932 DOA: 01/09/2019 PCP: Glenda Chroman, MD    Brief Narrative:  85 year old male with a history of chronic combined CHF with ejection fraction 35%, COPD, atrial fibrillation on anticoagulation, hypertension and diabetes, presented to the hospital with complaints of shortness of breath, cough and wheezing.  He was recently discharged from the hospital after being treated for COPD exacerbation with pneumonia.  Initially felt better on discharge, but shortly after returning home, the following day, he began to feel more short of breath and needed to come back to the hospital.  Work-up in the emergency room indicated an elevated BNP.  CT scan of the chest showed bilateral pleural effusions.  He was admitted to the hospital for further management of CHF exacerbation.   Assessment & Plan:   Principal Problem:   Pneumonia Active Problems:   HTN (hypertension)   Chronic combined systolic and diastolic CHF (congestive heart failure) (HCC)   CAD (coronary atherosclerotic disease)   COPD with acute exacerbation (HCC)   A-fib (HCC)   Acute renal failure superimposed on stage 3 chronic kidney disease (HCC)   Diet-controlled diabetes mellitus (HCC)   CHF (congestive heart failure) ([place])   1. Acute on chronic combined CHF.  Echocardiogram repeated and shows that EF is unchanged from 01/2018 at 35 to 40%.  He has evidence of bilateral pleural effusions.  Cardiology following and he is currently on IV Lasix.  Monitor urine output.  Entresto was restarted at lower dose. 2. Atrial fibrillation.  Currently rate controlled.  Continue Coreg and Eliquis. 3. Coronary artery disease.  Mildly elevated troponin likely related strain.  No evidence of ACS.  No chest pain. 4. Acute kidney injury on chronic kidney disease stage III.  Creatinine 1.6 on admission.   Continue to monitor in the setting of diuresis. 5. COPD.  Possibly contributing to shortness of  breath.  Currently on Solu-Medrol, nebulizer treatments and pulmonary hygiene.  If breathing continues to improve, consider de-escalating steroids.  6. Type 2 Diabetes.  This is diet controlled.  Blood sugars currently elevated in the setting of steroids.  Continue on sliding scale insulin.  Add prandial coverage.  7. Mechanical fall.  Patient slipped while he was in the bathroom.  X-ray of ribs was unrevealing.  He also complained of back pain and T-L-spine x-rays were obtained.  This confirmed remote T12 compression fracture.  Patient reports he has had this fracture for approximately 20 years.  DVT prophylaxis: Apixaban Code Status: DNR Family Communication: Discussed with wife and daughter at the bedside Disposition Plan: Discharge home once respiratory status has improved, obtain PT eval  Consultants:   Cardiology  Procedures:  Echo: 1. The left ventricle has moderate-severely reduced systolic function, with an ejection fraction of 30-35%. The cavity size was mildly dilated. There is moderately increased left ventricular wall thickness. Left ventricular diastolic Doppler parameters are indeterminate.  2. The right ventricle has normal systolic function. The cavity was normal. There is no increase in right ventricular wall thickness.  3. Left atrial size was moderately dilated.  4. Trivial pericardial effusion is present.  5. The mitral valve is degenerative. Moderate thickening of the mitral valve leaflet. Moderate calcification of the mitral valve leaflet. There is moderate mitral annular calcification present.  6. The tricuspid valve is normal in structure.  7. The aortic valve is tricuspid Moderate thickening of the aortic valve Moderate calcification of the aortic valve. mild stenosis of the aortic  valve.  8. The pulmonic valve was grossly normal. Pulmonic valve regurgitation is mild by color flow Doppler.   9. Right atrial pressure is estimated at 8 mmHg.  Antimicrobials:    Vancomycin 2/16 > 2/17  Cefepime 2/16 > 2/17  Subjective: He says that his breathing is getting better. No chest pain.    Objective: Vitals:   01/13/19 2040 01/13/19 2235 01/14/19 0552 01/14/19 0727  BP:  140/64 (!) 149/76   Pulse:  92 (!) 53   Resp:  18 20   Temp:  98.6 F (37 C) 98.4 F (36.9 C)   TempSrc:  Oral Oral   SpO2: 100% 100% 100% 100%  Weight:   93.5 kg   Height:        Intake/Output Summary (Last 24 hours) at 01/14/2019 1000 Last data filed at 01/14/2019 0611 Gross per 24 hour  Intake 720 ml  Output 1050 ml  Net -330 ml   Filed Weights   01/12/19 0508 01/13/19 0316 01/14/19 0552  Weight: 97.8 kg 94.6 kg 93.5 kg    Examination:  General exam: Alert, awake, oriented x 3 Respiratory system: Diminished breath sounds bilaterally with crackles at bases. Respiratory effort normal. Cardiovascular system:normal s1,s2 sounds. No murmurs, rubs, gallops. Gastrointestinal system: Abdomen is nondistended, soft and nontender. No organomegaly or masses felt. Normal bowel sounds heard. Central nervous system: Alert and oriented. No focal neurological deficits. Extremities: trace pretibial edema bilateral LEs.  Skin: No rashes, lesions or ulcers Psychiatry: Judgement and insight appear normal. Mood & affect appropriate.    Data Reviewed: I have personally reviewed following labs and imaging studies  CBC: Recent Labs  Lab 01/10/19 0013 01/11/19 0615  WBC 8.5 10.4  NEUTROABS 6.6 9.9*  HGB 13.4 12.6*  HCT 46.5 43.1  MCV 102.2* 99.3  PLT 122* 846*   Basic Metabolic Panel: Recent Labs  Lab 01/10/19 0013 01/11/19 0615 01/12/19 0549 01/13/19 0524 01/14/19 0607  NA 140 140 142 142 141  K 4.8 4.7 4.1 4.0 3.9  CL 107 106 106 104 99  CO2 27 26 28 30 31   GLUCOSE 214* 218* 200* 192* 213*  BUN 30* 32* 36* 35* 32*  CREATININE 1.63* 1.31* 1.24 1.06 1.08  CALCIUM 9.3 9.4 9.2 9.3 9.0   GFR: Estimated Creatinine Clearance: 59.5 mL/min (by C-G formula based on  SCr of 1.08 mg/dL). Liver Function Tests: Recent Labs  Lab 01/10/19 0013 01/11/19 0615  AST 23 15  ALT 43 39  ALKPHOS 56 51  BILITOT 1.5* 1.9*  PROT 6.8 6.2*  ALBUMIN 3.6 3.5   No results for input(s): LIPASE, AMYLASE in the last 168 hours. No results for input(s): AMMONIA in the last 168 hours. Coagulation Profile: No results for input(s): INR, PROTIME in the last 168 hours. Cardiac Enzymes: Recent Labs  Lab 01/10/19 0013 01/10/19 0350 01/10/19 1325 01/10/19 1752  TROPONINI 0.12* 0.09* 0.07* 0.08*   BNP (last 3 results) No results for input(s): PROBNP in the last 8760 hours. HbA1C: No results for input(s): HGBA1C in the last 72 hours. CBG: Recent Labs  Lab 01/13/19 0738 01/13/19 1104 01/13/19 1632 01/13/19 2105 01/14/19 0812  GLUCAP 185* 237* 247* 252* 235*   Lipid Profile: No results for input(s): CHOL, HDL, LDLCALC, TRIG, CHOLHDL, LDLDIRECT in the last 72 hours. Thyroid Function Tests: No results for input(s): TSH, T4TOTAL, FREET4, T3FREE, THYROIDAB in the last 72 hours. Anemia Panel: No results for input(s): VITAMINB12, FOLATE, FERRITIN, TIBC, IRON, RETICCTPCT in the last 72 hours. Sepsis Labs:  Recent Labs  Lab 01/10/19 0136 01/10/19 0347 01/10/19 1754 01/11/19 0615  PROCALCITON  --   --  <0.10 <0.10  LATICACIDVEN 1.6 2.4*  --   --     Recent Results (from the past 240 hour(s))  Blood Culture (routine x 2)     Status: None   Collection Time: 01/05/19  2:47 PM  Result Value Ref Range Status   Specimen Description   Final    BLOOD LEFT ANTECUBITAL Performed at [facility], [place] [address], [place], [place] [address]    Special Requests   Final    Blood Culture adequate volume BOTTLES DRAWN AEROBIC AND ANAEROBIC   Culture   Final    NO GROWTH 5 DAYS Performed at [facility], [place] [address], [place], [place] [address]    Report Status 01/10/2019 FINAL  Final  Blood Culture (routine x 2)     Status: None   Collection  Time: 01/05/19  3:07 PM  Result Value Ref Range Status   Specimen Description   Final    BLOOD RIGHT ANTECUBITAL Performed at [place] [address] [place], [place] [address]    Special Requests   Final    BOTTLES DRAWN AEROBIC AND ANAEROBIC Blood Culture adequate volume Performed at [place] [address], [place], [place] [address]    Culture   Final    NO GROWTH 5 DAYS Performed at [facility], [place] [address] [place], [place] [address]    Report Status 01/10/2019 FINAL  Final  Culture, sputum-assessment     Status: None   Collection Time: 01/05/19  5:31 PM  Result Value Ref Range Status   Specimen Description EXPECTORATED SPUTUM  Final   Special Requests NONE  Final   Sputum evaluation   Final    THIS SPECIMEN IS ACCEPTABLE FOR SPUTUM CULTURE Performed at [facility], [place] [address], [place], [place] [address]    Report Status 01/06/2019 FINAL  Final  Culture, respiratory     Status: None   Collection Time: 01/05/19  5:31 PM  Result Value Ref Range Status   Specimen Description   Final    EXPECTORATED SPUTUM Performed at [place] [address], [place], [place] [address]    Special Requests   Final    NONE Reflexed from [phone] Performed at [facility], [place] [address] [place], [place] [address]    Gram Stain   Final    NO WBC SEEN RARE SQUAMOUS EPITHELIAL CELLS PRESENT FEW GRAM POSITIVE COCCI IN CLUSTERS FEW GRAM POSITIVE RODS RARE GRAM NEGATIVE RODS    Culture   Final    MODERATE Consistent with normal respiratory flora. Performed at [facility], [place] [address], [place],  [address]    Report Status 01/08/2019 FINAL  Final  MRSA PCR Screening     Status: Abnormal   Collection Time: 01/05/19  6:52 PM  Result Value Ref Range Status   MRSA by PCR POSITIVE (A) NEGATIVE Final    Comment:        The GeneXpert MRSA Assay  (FDA approved for NASAL specimens only), is one component of a comprehensive MRSA colonization surveillance program. It is not intended to diagnose MRSA infection nor to guide or monitor treatment for MRSA infections. RESULT CALLED TO, READ BACK BY AND VERIFIED WITH: C.ADDISON AT 2145 ON 01/05/19 BY N.THOMPSON Performed at [facility], [address] [place], [place] [address]   Culture, Urine     Status: None   Collection Time: 01/06/19 12:30 AM  Result Value Ref Range Status   Specimen Description   Final    URINE, CLEAN CATCH Performed at [facility], [place] [address] [place], [place] [address]    Special Requests   Final    NONE Performed at [facility], [place] [address] [place], [place] [address]    Culture   Final    NO GROWTH Performed at [facility], [place] [address], [place], [place] [address]    Report Status 01/07/2019 FINAL  Final  Blood culture (routine x 2)     Status: None (Preliminary result)   Collection Time: 01/10/19  1:09 AM  Result Value Ref Range Status   Specimen Description BLOOD RIGHT FOREARM  Final   Special Requests   Final    BOTTLES DRAWN AEROBIC AND ANAEROBIC Blood Culture adequate volume   Culture   Final    NO GROWTH 4 DAYS Performed at [facility], [address] [place], [place] [address]    Report Status PENDING  Incomplete  Blood culture (routine x 2)     Status: None (Preliminary result)   Collection Time: 01/10/19  1:36 AM  Result Value Ref Range Status   Specimen Description LEFT ANTECUBITAL  Final   Special Requests   Final    BOTTLES DRAWN AEROBIC AND ANAEROBIC Blood Culture adequate volume   Culture   Final    NO GROWTH 4 DAYS Performed at [facility], [address], [place], [place] [address]    Report Status PENDING  Incomplete      Radiology Studies: Dg Thoracic Spine W/swimmers  Result Date: 01/13/2019 CLINICAL DATA:  Back pain due to fall EXAM:  THORACIC SPINE - 3 VIEWS COMPARISON:  Chest CT from 2 days ago FINDINGS: No evidence of acute fracture. Remote T12 compression fracture with advanced height loss. Pleural fluid that is partially visualized, known from preceding chest CT. Generalized spondylosis and disc narrowing typical of age IMPRESSION: 1. Remote T12 compression fracture. 2. No acute finding. Electronically Signed   By: Monte Fantasia M.D.   On: 01/13/2019 10:48   Dg Lumbar Spine 2-3 Views  Result Date: 01/13/2019 CLINICAL DATA:  Back pain EXAM: LUMBAR SPINE - 2-3 VIEW COMPARISON:  03/12/2018 FINDINGS: Remote T12 compression fracture as described on dedicated study. No acute fracture is seen in the lumbar spine. Generalized disc narrowing. Lower lumbar degenerative facet spurring. IMPRESSION: 1. No acute finding. 2. Remote T12 compression fracture. Electronically Signed   By: Monte Fantasia M.D.   On: 01/13/2019 10:50   Scheduled Meds: . apixaban  5 mg Oral BID  . budesonide (PULMICORT) nebulizer solution  0.25 mg Nebulization BID  . carvedilol  6.25 mg Oral BID WC  . ezetimibe  10 mg Oral Daily  . fluticasone  2 spray Each Nare Daily  . furosemide  60 mg Intravenous BID  . guaiFENesin  1,200 mg Oral BID  . hydrALAZINE  25 mg Oral Q8H  . insulin aspart  0-5 Units Subcutaneous QHS  . insulin aspart  0-9 Units Subcutaneous TID WC  . ipratropium-albuterol  3 mL Nebulization Q6H  . methylPREDNISolone (SOLU-MEDROL) injection  40 mg Intravenous Q12H  . nicotine  14 mg Transdermal Daily  . sacubitril-valsartan  1 tablet Oral BID  . sodium chloride flush  3 mL Intravenous Q12H  . sodium chloride flush  3 mL Intravenous Q12H   Continuous Infusions: . sodium chloride 250 mL (01/10/19 2201)    LOS: 3 days   Time spent: 22mins  Irwin Brakeman, MD [facility]  If 7PM-7AM, please contact night-coverage [email or web address]  01/14/2019, 10:00 AM

## 2019-01-14 NOTE — Progress Notes (Signed)
Progress Note  Patient Name: Gary Odonnell Date of Encounter: 01/14/2019  Primary Cardiologist: Carlyle Dolly, MD   Subjective   SOB continues to improve  Inpatient Medications    Scheduled Meds: . apixaban  5 mg Oral BID  . budesonide (PULMICORT) nebulizer solution  0.25 mg Nebulization BID  . carvedilol  6.25 mg Oral BID WC  . ezetimibe  10 mg Oral Daily  . fluticasone  2 spray Each Nare Daily  . furosemide  60 mg Intravenous BID  . guaiFENesin  1,200 mg Oral BID  . hydrALAZINE  25 mg Oral Q8H  . insulin aspart  0-5 Units Subcutaneous QHS  . insulin aspart  0-9 Units Subcutaneous TID WC  . insulin aspart  5 Units Subcutaneous TID WC  . ipratropium-albuterol  3 mL Nebulization Q6H  . methylPREDNISolone (SOLU-MEDROL) injection  40 mg Intravenous Q12H  . nicotine  14 mg Transdermal Daily  . sacubitril-valsartan  1 tablet Oral BID  . sodium chloride flush  3 mL Intravenous Q12H  . sodium chloride flush  3 mL Intravenous Q12H   Continuous Infusions: . sodium chloride 250 mL (01/10/19 2201)   PRN Meds: sodium chloride, acetaminophen **OR** acetaminophen, albuterol, hydrALAZINE, HYDROcodone-acetaminophen, ondansetron **OR** ondansetron (ZOFRAN) IV, senna-docusate, sodium chloride, sodium chloride flush   Vital Signs    Vitals:   01/13/19 2040 01/13/19 2235 01/14/19 0552 01/14/19 0727  BP:  140/64 (!) 149/76   Pulse:  92 (!) 53   Resp:  18 20   Temp:  98.6 F (37 C) 98.4 F (36.9 C)   TempSrc:  Oral Oral   SpO2: 100% 100% 100% 100%  Weight:   93.5 kg   Height:        Intake/Output Summary (Last 24 hours) at 01/14/2019 1313 Last data filed at 01/14/2019 0900 Gross per 24 hour  Intake 600 ml  Output 1050 ml  Net -450 ml   Last 3 Weights 01/14/2019 01/13/2019 01/12/2019  Weight (lbs) 206 lb 2.1 oz 208 lb 8.9 oz 215 lb 9.8 oz  Weight (kg) 93.5 kg 94.6 kg 97.8 kg      Telemetry    Rate controlled afib - Personally Reviewed  ECG    na  Physical Exam    GEN: No acute distress.   Neck: mildly elevated JVD Cardiac: irreg Respiratory: mild bilateral crackles.  GI: Soft, nontender, non-distended  MS: No edema; No deformity. Neuro:  Nonfocal  Psych: Normal affect   Labs    Chemistry Recent Labs  Lab 01/10/19 0013 01/11/19 0615 01/12/19 0549 01/13/19 0524 01/14/19 0607  NA 140 140 142 142 141  K 4.8 4.7 4.1 4.0 3.9  CL 107 106 106 104 99  CO2 27 26 28 30 31   GLUCOSE 214* 218* 200* 192* 213*  BUN 30* 32* 36* 35* 32*  CREATININE 1.63* 1.31* 1.24 1.06 1.08  CALCIUM 9.3 9.4 9.2 9.3 9.0  PROT 6.8 6.2*  --   --   --   ALBUMIN 3.6 3.5  --   --   --   AST 23 15  --   --   --   ALT 43 39  --   --   --   ALKPHOS 56 51  --   --   --   BILITOT 1.5* 1.9*  --   --   --   GFRNONAA 38* 50* 53* >60 >60  GFRAA 44* 58* >60 >60 >60  ANIONGAP 6 8 8 8  11  Hematology Recent Labs  Lab 01/10/19 0013 01/11/19 0615  WBC 8.5 10.4  RBC 4.55 4.34  HGB 13.4 12.6*  HCT 46.5 43.1  MCV 102.2* 99.3  MCH 29.5 29.0  MCHC 28.8* 29.2*  RDW 16.6* 16.0*  PLT 122* 121*    Cardiac Enzymes Recent Labs  Lab 01/10/19 0013 01/10/19 0350 01/10/19 1325 01/10/19 1752  TROPONINI 0.12* 0.09* 0.07* 0.08*   No results for input(s): TROPIPOC in the last 168 hours.   BNP Recent Labs  Lab 01/10/19 0013  BNP 2,650.0*     DDimer No results for input(s): DDIMER in the last 168 hours.   Radiology    Dg Thoracic Spine W/swimmers  Result Date: 01/13/2019 CLINICAL DATA:  Back pain due to fall EXAM: THORACIC SPINE - 3 VIEWS COMPARISON:  Chest CT from 2 days ago FINDINGS: No evidence of acute fracture. Remote T12 compression fracture with advanced height loss. Pleural fluid that is partially visualized, known from preceding chest CT. Generalized spondylosis and disc narrowing typical of age IMPRESSION: 1. Remote T12 compression fracture. 2. No acute finding. Electronically Signed   By: Monte Fantasia M.D.   On: 01/13/2019 10:48   Dg Lumbar Spine 2-3  Views  Result Date: 01/13/2019 CLINICAL DATA:  Back pain EXAM: LUMBAR SPINE - 2-3 VIEW COMPARISON:  03/12/2018 FINDINGS: Remote T12 compression fracture as described on dedicated study. No acute fracture is seen in the lumbar spine. Generalized disc narrowing. Lower lumbar degenerative facet spurring. IMPRESSION: 1. No acute finding. 2. Remote T12 compression fracture. Electronically Signed   By: Monte Fantasia M.D.   On: 01/13/2019 10:50    Cardiac Studies    Patient Profile     Gary Odonnell a 85 y.o.malewith a hx of ischemic DCMwho is being seen today for the evaluation of CHFat the request of Dr .Roderic Palau.  Assessment & Plan    1. Acute on chronic systolic HF/CAD -history of prior CABG 2003 in [place] - cath 09/2013 showed LM 30%, LAD 100% proximal, LCX 50% ostial, RCA 100% mid with distal vessel filling with right to right and left to right collaterals. LIMA-LAD patent, graft from [place] to OM1, OM2, PDA occluded. No PCI targets, recommendations for medical management. RHC mean PA 43, wedge 22, CI 1.74.  - 01/2018 echo LVEF 35-40%, grade II diastolic dysfunction.  12/2018 echo LVEF 30-35%, mild AS - BNP on admission 2650. CXR bibasilar opacification. CT chest bilateral pleural effusions, moderate to large. No pneumonia. Small ascites  - I/Os are incomplete. On lasix 60mg  IV bid, stable renal function and overall downtrend in Cr with diuresis. Weights down from 94.6 to 93.5 kg today.  - Continue coreg 6.25mg  bid, restarted his entresto however at lower dose due to initial AKI. As titrate back up entresto likely will be able to come off hydralazine.   2. COPDexacerbation - management per primary team  3. Afib - new diagnosis during 12/27/2018 ER visit - CHADS2Vasc score is (age x2, CHF, HTN, stroke x2, CAD) is 7, continue eliquis  - continue current meds  For questions or updates, please contact [facility] Please consult [email or web address] for contact info under         Signed, Carlyle Dolly, MD  01/14/2019, 1:13 PM

## 2019-01-14 NOTE — Plan of Care (Addendum)
  Problem: Acute Rehab PT Goals(only PT should resolve) Goal: Pt Will Go Supine/Side To Sit Outcome: Progressing Flowsheets (Taken 01/14/2019 1416) Pt will go Supine/Side to Sit: with modified independence Goal: Patient Will Transfer Sit To/From Stand Outcome: Progressing Flowsheets (Taken 01/14/2019 1416) Patient will transfer sit to/from stand: with supervision Goal: Pt Will Transfer Bed To Chair/Chair To Bed Outcome: Progressing Flowsheets (Taken 01/14/2019 1416) Pt will Transfer Bed to Chair/Chair to Bed: with supervision Goal: Pt Will Ambulate Outcome: Progressing Flowsheets (Taken 01/14/2019 1416) Pt will Ambulate: 50 feet; with supervision; with cane   2:17 PM, 01/14/19 Lonell Grandchild, MPT Physical Therapist with [facility] 336 [phone] office [phone] mobile phone

## 2019-01-15 ENCOUNTER — Inpatient Hospital Stay (HOSPITAL_COMMUNITY): Payer: Medicare Other

## 2019-01-15 LAB — CBC
HCT: 45.2 % (ref 39.0–52.0)
Hemoglobin: 13.1 g/dL (ref 13.0–17.0)
MCH: 28.7 pg (ref 26.0–34.0)
MCHC: 29 g/dL — ABNORMAL LOW (ref 30.0–36.0)
MCV: 99.1 fL (ref 80.0–100.0)
PLATELETS: 126 10*3/uL — AB (ref 150–400)
RBC: 4.56 MIL/uL (ref 4.22–5.81)
RDW: 16.4 % — AB (ref 11.5–15.5)
WBC: 12.1 10*3/uL — ABNORMAL HIGH (ref 4.0–10.5)
nRBC: 0 % (ref 0.0–0.2)

## 2019-01-15 LAB — BASIC METABOLIC PANEL
Anion gap: 9 (ref 5–15)
BUN: 39 mg/dL — ABNORMAL HIGH (ref 8–23)
CALCIUM: 9.4 mg/dL (ref 8.9–10.3)
CO2: 35 mmol/L — ABNORMAL HIGH (ref 22–32)
Chloride: 98 mmol/L (ref 98–111)
Creatinine, Ser: 1.06 mg/dL (ref 0.61–1.24)
GFR calc Af Amer: >60 mL/min (ref >60)
Glucose, Bld: 177 mg/dL — ABNORMAL HIGH (ref 70–99)
Potassium: 3.6 mmol/L (ref 3.5–5.1)
SODIUM: 142 mmol/L (ref 135–145)

## 2019-01-15 LAB — GLUCOSE, CAPILLARY
Glucose-Capillary: 183 mg/dL — ABNORMAL HIGH (ref 70–99)
Glucose-Capillary: 343 mg/dL — ABNORMAL HIGH (ref 70–99)
Glucose-Capillary: 211 mg/dL — ABNORMAL HIGH (ref 70–99)
Glucose-Capillary: 230 mg/dL — ABNORMAL HIGH (ref 70–99)

## 2019-01-15 LAB — BLOOD GAS, VENOUS
ACID-BASE EXCESS: 4.1 mmol/L — AB (ref 0.0–2.0)
Acid-base deficit: 5.3 mmol/L — ABNORMAL HIGH (ref 0.0–2.0)
Bicarbonate: 30.7 mmol/L — ABNORMAL HIGH (ref 20.0–28.0)
O2 Saturation: 30.9 %
Patient temperature: 98.6
pCO2, Ven: 57.8 mmHg (ref 44.0–60.0)
pH, Ven: 7.34 (ref 7.250–7.430)

## 2019-01-15 LAB — CULTURE, BLOOD (ROUTINE X 2)
Culture: NO GROWTH
Culture: NO GROWTH
Special Requests: ADEQUATE
Special Requests: ADEQUATE

## 2019-01-15 LAB — MAGNESIUM: Magnesium: 2.2 mg/dL (ref 1.7–2.4)

## 2019-01-15 MED ORDER — OSELTAMIVIR PHOSPHATE 75 MG PO CAPS
75.0000 mg | ORAL_CAPSULE | Freq: Every day | ORAL | Status: DC
  Administered 2019-01-15: 75 mg via ORAL
  Filled 2019-01-15: qty 1

## 2019-01-15 MED ORDER — METHYLPREDNISOLONE SODIUM SUCC 40 MG IJ SOLR
40.0000 mg | INTRAMUSCULAR | Status: DC
  Administered 2019-01-16 – 2019-01-17 (×2): 40 mg via INTRAVENOUS
  Filled 2019-01-15 (×2): qty 1

## 2019-01-15 NOTE — Progress Notes (Signed)
PROGRESS NOTE    Gary Odonnell  XKG:[id number] DOB: Apr 03, 1932 DOA: 01/09/2019 PCP: Glenda Chroman, MD    Brief Narrative:  85 year old male with a history of chronic combined CHF with ejection fraction 35%, COPD, atrial fibrillation on anticoagulation, hypertension and diabetes, presented to the hospital with complaints of shortness of breath, cough and wheezing.  He was recently discharged from the hospital after being treated for COPD exacerbation with pneumonia.  Initially felt better on discharge, but shortly after returning home, the following day, he began to feel more short of breath and needed to come back to the hospital.  Work-up in the emergency room indicated an elevated BNP.  CT scan of the chest showed bilateral pleural effusions.  He was admitted to the hospital for further management of CHF exacerbation.   Assessment & Plan:   Principal Problem:   Pneumonia Active Problems:   HTN (hypertension)   Chronic combined systolic and diastolic CHF (congestive heart failure) (HCC)   CAD (coronary atherosclerotic disease)   COPD with acute exacerbation (HCC)   A-fib (HCC)   Acute renal failure superimposed on stage 3 chronic kidney disease (HCC)   Diet-controlled diabetes mellitus (HCC)   CHF (congestive heart failure) (HCC)   Acute on chronic systolic heart failure ([place])   1. Acute on chronic combined CHF.  Echocardiogram repeated and shows that EF is unchanged from 01/2018 at 35 to 40%.  He has evidence of bilateral pleural effusions.  Cardiology following and he is currently on IV Lasix.  Monitor urine output.  Entresto was restarted at lower dose. 2. Persistent Atrial fibrillation.  Currently rate controlled.  Continue Coreg and Eliquis. 3. Coronary artery disease.  Mildly elevated troponin likely related strain.  No evidence of ACS.  No chest pain. 4. Acute kidney injury on chronic kidney disease stage III.  Creatinine 1.6 on admission.   Continue to monitor in the setting  of diuresis.  Creatinine remains stable.   5. COPD.  Possibly contributing to shortness of breath.  Currently on Solu-Medrol, nebulizer treatments and pulmonary hygiene.  If breathing continues to improve, Continuing to wean steroids.  6. Type 2 Diabetes.  This is diet controlled.  Blood sugars currently elevated in the setting of steroids.  Continue on sliding scale insulin.  Added prandial coverage.  7. Mechanical fall.  Patient slipped while he was in the bathroom.  X-ray of ribs was unrevealing.  He also complained of back pain and T-L-spine x-rays were obtained.  This confirmed remote T12 compression fracture.  Patient reports he has had this fracture for approximately 20 years.  DVT prophylaxis: Apixaban Code Status: DNR Family Communication: Discussed with wife and daughter at the bedside Disposition Plan: Discharge home soon as he is feeling better, with home health services  Consultants:   Cardiology  Procedures:  Echo: 1. The left ventricle has moderate-severely reduced systolic function, with an ejection fraction of 30-35%. The cavity size was mildly dilated. There is moderately increased left ventricular wall thickness. Left ventricular diastolic Doppler parameters are indeterminate.  2. The right ventricle has normal systolic function. The cavity was normal. There is no increase in right ventricular wall thickness.  3. Left atrial size was moderately dilated.  4. Trivial pericardial effusion is present.  5. The mitral valve is degenerative. Moderate thickening of the mitral valve leaflet. Moderate calcification of the mitral valve leaflet. There is moderate mitral annular calcification present.  6. The tricuspid valve is normal in structure.  7. The aortic valve  is tricuspid Moderate thickening of the aortic valve Moderate calcification of the aortic valve. mild stenosis of the aortic valve.  8. The pulmonic valve was grossly normal. Pulmonic valve regurgitation is mild by color  flow Doppler.   9. Right atrial pressure is estimated at 8 mmHg.  Antimicrobials:   Vancomycin 2/16 > 2/17  Cefepime 2/16 > 2/17  Subjective: Pt says that he is feeling better and asking about home, says that his wife cares for him and he has home health service.   Objective: Vitals:   01/14/19 1506 01/14/19 1959 01/15/19 0517 01/15/19 0933  BP:   130/88   Pulse:   88   Resp:   18   Temp:   98.3 F (36.8 C)   TempSrc:   Oral   SpO2: 100% (!) 85% 96% 98%  Weight:   111.7 kg   Height:        Intake/Output Summary (Last 24 hours) at 01/15/2019 1012 Last data filed at 01/15/2019 0700 Gross per 24 hour  Intake -  Output 1100 ml  Net -1100 ml   Filed Weights   01/13/19 0316 01/14/19 0552 01/15/19 0517  Weight: 94.6 kg 93.5 kg 111.7 kg    Examination:  General exam: Alert, awake, oriented x 3 Respiratory system: BBS with occasional expiratory wheezes.  Respiratory effort normal. Cardiovascular system:normal s1,s2 sounds. No murmurs, rubs, gallops. Gastrointestinal system: Abdomen is nondistended, soft and nontender. No organomegaly or masses felt. Normal bowel sounds heard. Central nervous system: Alert and oriented. No focal neurological deficits. Extremities: no edema BLEs.   Skin: No rashes, lesions or ulcers Psychiatry: Judgement and insight appear normal. Mood & affect appropriate.    Data Reviewed: I have personally reviewed following labs and imaging studies  CBC: Recent Labs  Lab 01/10/19 0013 01/11/19 0615 01/15/19 0547  WBC 8.5 10.4 12.1*  NEUTROABS 6.6 9.9*  --   HGB 13.4 12.6* 13.1  HCT 46.5 43.1 45.2  MCV 102.2* 99.3 99.1  PLT 122* 121* 952*   Basic Metabolic Panel: Recent Labs  Lab 01/11/19 0615 01/12/19 0549 01/13/19 0524 01/14/19 0607 01/15/19 0547  NA 140 142 142 141 142  K 4.7 4.1 4.0 3.9 3.6  CL 106 106 104 99 98  CO2 26 28 30 31  35*  GLUCOSE 218* 200* 192* 213* 177*  BUN 32* 36* 35* 32* 39*  CREATININE 1.31* 1.24 1.06 1.08  1.06  CALCIUM 9.4 9.2 9.3 9.0 9.4  MG  --   --   --   --  2.2   GFR: Estimated Creatinine Clearance: 66 mL/min (by C-G formula based on SCr of 1.06 mg/dL). Liver Function Tests: Recent Labs  Lab 01/10/19 0013 01/11/19 0615  AST 23 15  ALT 43 39  ALKPHOS 56 51  BILITOT 1.5* 1.9*  PROT 6.8 6.2*  ALBUMIN 3.6 3.5   No results for input(s): LIPASE, AMYLASE in the last 168 hours. No results for input(s): AMMONIA in the last 168 hours. Coagulation Profile: No results for input(s): INR, PROTIME in the last 168 hours. Cardiac Enzymes: Recent Labs  Lab 01/10/19 0013 01/10/19 0350 01/10/19 1325 01/10/19 1752  TROPONINI 0.12* 0.09* 0.07* 0.08*   BNP (last 3 results) No results for input(s): PROBNP in the last 8760 hours. HbA1C: No results for input(s): HGBA1C in the last 72 hours. CBG: Recent Labs  Lab 01/14/19 0812 01/14/19 1110 01/14/19 1623 01/14/19 2203 01/15/19 0727  GLUCAP 235* 186* 165* 165* 183*   Lipid Profile: No results for  input(s): CHOL, HDL, LDLCALC, TRIG, CHOLHDL, LDLDIRECT in the last 72 hours. Thyroid Function Tests: No results for input(s): TSH, T4TOTAL, FREET4, T3FREE, THYROIDAB in the last 72 hours. Anemia Panel: No results for input(s): VITAMINB12, FOLATE, FERRITIN, TIBC, IRON, RETICCTPCT in the last 72 hours. Sepsis Labs: Recent Labs  Lab 01/10/19 0136 01/10/19 0347 01/10/19 1754 01/11/19 0615  PROCALCITON  --   --  <0.10 <0.10  LATICACIDVEN 1.6 2.4*  --   --     Recent Results (from the past 240 hour(s))  Blood Culture (routine x 2)     Status: None   Collection Time: 01/05/19  2:47 PM  Result Value Ref Range Status   Specimen Description   Final    BLOOD LEFT ANTECUBITAL Performed at [facility], [place] [address], [place], [place] [address]    Special Requests   Final    Blood Culture adequate volume BOTTLES DRAWN AEROBIC AND ANAEROBIC   Culture   Final    NO GROWTH 5 DAYS Performed at [facility],  [place] [address], [place], [place] [address]    Report Status 01/10/2019 FINAL  Final  Blood Culture (routine x 2)     Status: None   Collection Time: 01/05/19  3:07 PM  Result Value Ref Range Status   Specimen Description   Final    BLOOD RIGHT ANTECUBITAL Performed at [place] [address] [place], [place] [address]    Special Requests   Final    BOTTLES DRAWN AEROBIC AND ANAEROBIC Blood Culture adequate volume Performed at [place] [address], [place], [place] [address]    Culture   Final    NO GROWTH 5 DAYS Performed at [facility], [place] [address] [place], [place] [address]    Report Status 01/10/2019 FINAL  Final  Culture, sputum-assessment     Status: None   Collection Time: 01/05/19  5:31 PM  Result Value Ref Range Status   Specimen Description EXPECTORATED SPUTUM  Final   Special Requests NONE  Final   Sputum evaluation   Final    THIS SPECIMEN IS ACCEPTABLE FOR SPUTUM CULTURE Performed at [facility], [place] [address], [place], [place] [address]    Report Status 01/06/2019 FINAL  Final  Culture, respiratory     Status: None   Collection Time: 01/05/19  5:31 PM  Result Value Ref Range Status   Specimen Description   Final    EXPECTORATED SPUTUM Performed at [place] [address], [place], [place] [address]    Special Requests   Final    NONE Reflexed from [phone] Performed at [facility], [place] [address], [place], [place] [address]    Gram Stain   Final    NO WBC SEEN RARE SQUAMOUS EPITHELIAL CELLS PRESENT FEW GRAM POSITIVE COCCI IN CLUSTERS FEW GRAM POSITIVE RODS RARE GRAM NEGATIVE RODS    Culture   Final    MODERATE Consistent with normal respiratory flora. Performed at [facility], [place] [address], [place], [place] [address]    Report Status 01/08/2019 FINAL  Final  MRSA PCR Screening     Status: Abnormal   Collection Time:  01/05/19  6:52 PM  Result Value Ref Range Status   MRSA by PCR POSITIVE (A) NEGATIVE Final    Comment:        The GeneXpert MRSA Assay (FDA approved for NASAL specimens only), is one  component of a comprehensive MRSA colonization surveillance program. It is not intended to diagnose MRSA infection nor to guide or monitor treatment for MRSA infections. RESULT CALLED TO, READ BACK BY AND VERIFIED WITH: C.ADDISON AT 2145 ON 01/05/19 BY N.THOMPSON Performed at [facility], [place] [address], [place], [place] [address]   Culture, Urine     Status: None   Collection Time: 01/06/19 12:30 AM  Result Value Ref Range Status   Specimen Description   Final    URINE, CLEAN CATCH Performed at [facility], [place] [address] [place], [place] [address]    Special Requests   Final    NONE Performed at [facility], [place] [address], [place], [place] [address]    Culture   Final    NO GROWTH Performed at [facility], [place] [address] [place], [place] [address]    Report Status 01/07/2019 FINAL  Final  Blood culture (routine x 2)     Status: None   Collection Time: 01/10/19  1:09 AM  Result Value Ref Range Status   Specimen Description BLOOD RIGHT FOREARM  Final   Special Requests   Final    BOTTLES DRAWN AEROBIC AND ANAEROBIC Blood Culture adequate volume   Culture   Final    NO GROWTH 5 DAYS Performed at [facility], [address] [place], [place] [address]    Report Status 01/15/2019 FINAL  Final  Blood culture (routine x 2)     Status: None   Collection Time: 01/10/19  1:36 AM  Result Value Ref Range Status   Specimen Description LEFT ANTECUBITAL  Final   Special Requests   Final    BOTTLES DRAWN AEROBIC AND ANAEROBIC Blood Culture adequate volume   Culture   Final    NO GROWTH 5 DAYS Performed at [facility], [address] [place], [place] [address]    Report Status 01/15/2019 FINAL  Final      Radiology  Studies: Dg Chest Port 1 View  Result Date: 01/15/2019 CLINICAL DATA:  Leukocytosis. EXAM: PORTABLE CHEST 1 VIEW COMPARISON:  Chest CT 01/10/2019 FINDINGS: Cardiomediastinal silhouette is stably enlarged. Mediastinal contours appear intact. There is no significant change in the appearance of the chest with bilateral pleural effusions and lower lobe atelectasis versus airspace consolidation. Osseous structures are without acute abnormality. Soft tissues are grossly normal. IMPRESSION: No significant change in the appearance of the chest with bilateral pleural effusions and lower lobes atelectasis versus airspace consolidation. Electronically Signed   By: Fidela Salisbury M.D.   On: 01/15/2019 08:24   Scheduled Meds: . apixaban  5 mg Oral BID  . budesonide (PULMICORT) nebulizer solution  0.25 mg Nebulization BID  . carvedilol  6.25 mg Oral BID WC  . ezetimibe  10 mg Oral Daily  . fluticasone  2 spray Each Nare Daily  . furosemide  60 mg Intravenous BID  . guaiFENesin  1,200 mg Oral BID  . hydrALAZINE  25 mg Oral Q8H  . insulin aspart  0-5 Units Subcutaneous QHS  . insulin aspart  0-9 Units Subcutaneous TID WC  . insulin aspart  5 Units Subcutaneous TID WC  . ipratropium-albuterol  3 mL Nebulization Q6H WA  . [START ON 01/16/2019] methylPREDNISolone (SOLU-MEDROL) injection  40 mg Intravenous Q24H  . nicotine  14 mg Transdermal Daily  . sacubitril-valsartan  1 tablet Oral BID  . sodium chloride flush  3 mL Intravenous Q12H  . sodium chloride flush  3  mL Intravenous Q12H   Continuous Infusions: . sodium chloride 250 mL (01/10/19 2201)    LOS: 4 days   Time spent: 28 mins  Kaleigha Chamberlin, MD [facility]  If 7PM-7AM, please contact night-coverage [email or web address]  01/15/2019, 10:12 AM

## 2019-01-15 NOTE — Progress Notes (Signed)
01/15/2019 5:57 PM  Staff member that patient was exposed to has tested positive for influenza A.  Per ID Dr. Megan Salon, start prophylaxis.   Murvin Natal MD

## 2019-01-15 NOTE — Care Management Note (Signed)
Case Management Note  Patient Details  Name: BRADLY SANGIOVANNI MRN: [id number] Date of Birth: 04-Feb-1932  Action/Plan: CM spoke with duaghter Otila Kluver, who says pt was DC'd from [facility] with neb machine but not meds, previous AVS reviewed and they were not told meds had been sent to [facility]. Daughter will tell wife to go pick them up. Family has no preference of DME provider. CMS provider options given to daughter. Referral faxed to [facility]. If pt does not DC Sat, will need assessment to re-qualify for oxygen.   Expected Discharge Date:  01/11/19               Expected Discharge Plan:  [place]  In-House Referral:  NA  Discharge planning Services  CM Consult  Post Acute Care Choice:  Home Health, Durable Medical Equipment Choice offered to:  Patient  DME Arranged:  Oxygen DME Agency:  [facility]  HH Arranged:  PT, RN [facility] Agency:  [place]  Status of Service:  Completed, signed off  Sherald Barge, RN 01/15/2019, 1:21 PM

## 2019-01-15 NOTE — Progress Notes (Addendum)
Patient's oxygen sat  on room air was 83% at rest, currently at 3 liters of oxygen via nasal canula ,saturation is 95 %.

## 2019-01-15 NOTE — Progress Notes (Signed)
Physical Therapy Treatment Patient Details Name: Gary Odonnell MRN: [id number] DOB: 05-10-1932 Today's Date: 01/15/2019    History of Present Illness MUSA Gary Odonnell is a 85 y.o. male with medical history significant for coronary artery disease, type 2 diabetes mellitus, hypertension, atrial fibrillation on Eliquis, COPD, and chronic combined systolic and diastolic CHF, now presenting to the emergency department for evaluation of increasing shortness of breath, cough, and wheezing.  Patient was just discharged from the hospital after treatment for pneumonia with Rocephin and azithromycin.  Patient developed a productive cough and increased shortness of breath approximately 2 weeks ago, was started on Augmentin in the outpatient setting, continued to worsen and developed significant wheezing, and was admitted to the hospital on 01/05/2019, started on Rocephin and azithromycin, and was discharged with oxygen on 01/08/2019 in improved condition.  Unfortunately, over the past 24 hours or so, his shortness of breath has worsened again and he has begun to wheeze again as well.  He denies any lower extremity edema, denies any chest pain, and has not noted any fevers or chills.    PT Comments    Pt presents supine in bed with HOB elevated and agreeable to therapy. Pt continues with good bed mobility and transfers using [facility] for stability. Pt with 1 loss of balance with gait training when turning requiring min assist to prevent fall, pt able to recover and widen BOS to improve balance. PT educates pt on gait pattern with SPC and possibly using quad cane to improve stability, but pt reports his SPC at home is "more sturdy" so he doesn't have difficulty with turns. Pt on 3 LPM and pt's O2 desats to 85% with gait training, able to recover to 95% with seated rest and pursed lip breathing. Pt tolerates remaining up in chair to enjoy lunch after therapy session, chair alarm on, and call bell in lap. Pt will benefit from  continued physical therapy in hospital and recommended venue below to increase strength, balance, endurance for safe ADLs and gait.    Follow Up Recommendations  Home health PT;Supervision for mobility/OOB;Supervision - Intermittent     Equipment Recommendations  None recommended by PT    Recommendations for Other Services       Precautions / Restrictions Precautions Precautions: Fall Precaution Comments: monitior O2 Restrictions Weight Bearing Restrictions: No    Mobility  Bed Mobility Overal bed mobility: Needs Assistance Bed Mobility: Supine to Sit     Supine to sit: Supervision     General bed mobility comments: increased time, use of bed rail  Transfers Overall transfer level: Needs assistance Equipment used: Straight cane Transfers: Sit to/from Stand;Stand Pivot Transfers Sit to Stand: Min guard Stand pivot transfers: Min guard       General transfer comment: increased time  Ambulation/Gait Ambulation/Gait assistance: Min guard Gait Distance (Feet): 20 Feet Assistive device: Straight cane Gait Pattern/deviations: Step-through pattern;Decreased step length - right;Decreased step length - left;Decreased stride length;Trunk flexed Gait velocity: decreased   General Gait Details: Pt with decreased heel-toe pattern, flexed trunk throughout gait cycle, 1 loss of balance requiring min assist to prevent fall, on 3 LPM and O2 sat 86-95%   Stairs             Wheelchair Mobility    Modified Rankin (Stroke Patients Only)       Balance Overall balance assessment: Needs assistance Sitting-balance support: Feet supported;No upper extremity supported Sitting balance-Leahy Scale: Good Sitting balance - Comments: seated EOB   Standing balance support:  Single extremity supported;During functional activity Standing balance-Leahy Scale: Fair Standing balance comment: with SPC                            Cognition Arousal/Alertness:  Awake/alert Behavior During Therapy: WFL for tasks assessed/performed Overall Cognitive Status: Within Functional Limits for tasks assessed                                        Exercises      General Comments        Pertinent Vitals/Pain Pain Assessment: No/denies pain    Home Living                      Prior Function            PT Goals (current goals can now be found in the care plan section) Acute Rehab PT Goals Patient Stated Goal: return home with family to assist PT Goal Formulation: With patient Time For Goal Achievement: 01/18/19 Potential to Achieve Goals: Good Progress towards PT goals: Progressing toward goals    Frequency    Min 3X/week      PT Plan Current plan remains appropriate    Co-evaluation              AM-PAC PT "6 Clicks" Mobility   Outcome Measure  Help needed turning from your back to your side while in a flat bed without using bedrails?: None Help needed moving from lying on your back to sitting on the side of a flat bed without using bedrails?: None Help needed moving to and from a bed to a chair (including a wheelchair)?: A Little Help needed standing up from a chair using your arms (e.g., wheelchair or bedside chair)?: A Little Help needed to walk in hospital room?: A Little Help needed climbing 3-5 steps with a railing? : A Lot 6 Click Score: 19    End of Session Equipment Utilized During Treatment: Oxygen(3 LPM) Activity Tolerance: Patient tolerated treatment well;Patient limited by fatigue Patient left: in chair;with call bell/phone within reach;with chair alarm set Nurse Communication: Mobility status PT Visit Diagnosis: Unsteadiness on feet (R26.81);Muscle weakness (generalized) (M62.81);Difficulty in walking, not elsewhere classified (R26.2)     Time: 6010-9323 PT Time Calculation (min) (ACUTE ONLY): 14 min  Charges:  $Gait Training: 8-22 mins                     12:50 PM,  01/15/19 Talbot Grumbling, DPT Physical Therapist with [facility] [phone] office

## 2019-01-15 NOTE — Progress Notes (Addendum)
Progress Note  Patient Name: Gary Odonnell Date of Encounter: 01/15/2019  Primary Cardiologist: Carlyle Dolly, MD   Subjective   Reports his breathing continues to improve but not at baseline. Sat in the chair for a few hours yesterday. No chest pain or palpitations.   Inpatient Medications    Scheduled Meds: . apixaban  5 mg Oral BID  . budesonide (PULMICORT) nebulizer solution  0.25 mg Nebulization BID  . carvedilol  6.25 mg Oral BID WC  . ezetimibe  10 mg Oral Daily  . fluticasone  2 spray Each Nare Daily  . furosemide  60 mg Intravenous BID  . guaiFENesin  1,200 mg Oral BID  . hydrALAZINE  25 mg Oral Q8H  . insulin aspart  0-5 Units Subcutaneous QHS  . insulin aspart  0-9 Units Subcutaneous TID WC  . insulin aspart  5 Units Subcutaneous TID WC  . ipratropium-albuterol  3 mL Nebulization Q6H WA  . [START ON 01/16/2019] methylPREDNISolone (SOLU-MEDROL) injection  40 mg Intravenous Q24H  . nicotine  14 mg Transdermal Daily  . sacubitril-valsartan  1 tablet Oral BID  . sodium chloride flush  3 mL Intravenous Q12H  . sodium chloride flush  3 mL Intravenous Q12H   Continuous Infusions: . sodium chloride 250 mL (01/10/19 2201)   PRN Meds: sodium chloride, acetaminophen **OR** acetaminophen, albuterol, hydrALAZINE, HYDROcodone-acetaminophen, ondansetron **OR** ondansetron (ZOFRAN) IV, senna-docusate, sodium chloride, sodium chloride flush   Vital Signs    Vitals:   01/14/19 1327 01/14/19 1506 01/14/19 1959 01/15/19 0517  BP: (!) 122/55   130/88  Pulse: 85   88  Resp: 18   18  Temp: (!) 97.5 F (36.4 C)   98.3 F (36.8 C)  TempSrc: Oral   Oral  SpO2: 100% 100% (!) 85% 96%  Weight:    111.7 kg  Height:        Intake/Output Summary (Last 24 hours) at 01/15/2019 0938 Last data filed at 01/15/2019 0700 Gross per 24 hour  Intake -  Output 1100 ml  Net -1100 ml   Filed Weights   01/13/19 0316 01/14/19 0552 01/15/19 0517  Weight: 94.6 kg 93.5 kg 111.7 kg     Telemetry    Atrial fibrillation, HR in 60's to 80's.  - Personally Reviewed  ECG    No new tracings.   Physical Exam   General: Well developed, well nourished [name] male appearing in no acute distress. Head: Normocephalic, atraumatic.  Neck: Supple without bruits, JVD at 8cm. Lungs:  Resp regular and unlabored, expiratory wheeze along upper lung fields with rhonchi throughout which improved with coughing. Heart: Irregularly irregular, S1, S2, no S3, S4, or murmur; no rub. Abdomen: Soft, non-tender, non-distended with normoactive bowel sounds. No hepatomegaly. No rebound/guarding. No obvious abdominal masses. Extremities: No clubbing, cyanosis, or lower extremity edema. Distal pedal pulses are 2+ bilaterally. Neuro: Alert and oriented X 3. Moves all extremities spontaneously. Psych: Normal affect.  Labs    Chemistry Recent Labs  Lab 01/10/19 0013 01/11/19 0615  01/13/19 0524 01/14/19 0607 01/15/19 0547  NA 140 140   < > 142 141 142  K 4.8 4.7   < > 4.0 3.9 3.6  CL 107 106   < > 104 99 98  CO2 27 26   < > 30 31 35*  GLUCOSE 214* 218*   < > 192* 213* 177*  BUN 30* 32*   < > 35* 32* 39*  CREATININE 1.63* 1.31*   < > 1.06  1.08 1.06  CALCIUM 9.3 9.4   < > 9.3 9.0 9.4  PROT 6.8 6.2*  --   --   --   --   ALBUMIN 3.6 3.5  --   --   --   --   AST 23 15  --   --   --   --   ALT 43 39  --   --   --   --   ALKPHOS 56 51  --   --   --   --   BILITOT 1.5* 1.9*  --   --   --   --   GFRNONAA 38* 50*   < > >60 >60 >60  GFRAA 44* 58*   < > >60 >60 >60  ANIONGAP 6 8   < > 8 11 9    < > = values in this interval not displayed.     Hematology Recent Labs  Lab 01/10/19 0013 01/11/19 0615 01/15/19 0547  WBC 8.5 10.4 12.1*  RBC 4.55 4.34 4.56  HGB 13.4 12.6* 13.1  HCT 46.5 43.1 45.2  MCV 102.2* 99.3 99.1  MCH 29.5 29.0 28.7  MCHC 28.8* 29.2* 29.0*  RDW 16.6* 16.0* 16.4*  PLT 122* 121* 126*    Cardiac Enzymes Recent Labs  Lab 01/10/19 0013 01/10/19 0350  01/10/19 1325 01/10/19 1752  TROPONINI 0.12* 0.09* 0.07* 0.08*   No results for input(s): TROPIPOC in the last 168 hours.   BNP Recent Labs  Lab 01/10/19 0013  BNP 2,650.0*     DDimer No results for input(s): DDIMER in the last 168 hours.   Radiology    Dg Chest Port 1 View  Result Date: 01/15/2019 CLINICAL DATA:  Leukocytosis. EXAM: PORTABLE CHEST 1 VIEW COMPARISON:  Chest CT 01/10/2019 FINDINGS: Cardiomediastinal silhouette is stably enlarged. Mediastinal contours appear intact. There is no significant change in the appearance of the chest with bilateral pleural effusions and lower lobe atelectasis versus airspace consolidation. Osseous structures are without acute abnormality. Soft tissues are grossly normal. IMPRESSION: No significant change in the appearance of the chest with bilateral pleural effusions and lower lobes atelectasis versus airspace consolidation. Electronically Signed   By: Fidela Salisbury M.D.   On: 01/15/2019 08:24    Cardiac Studies   Echocardiogram: 01/11/2019 IMPRESSIONS   1. The left ventricle has moderate-severely reduced systolic function, with an ejection fraction of 30-35%. The cavity size was mildly dilated. There is moderately increased left ventricular wall thickness. Left ventricular diastolic Doppler parameters  are indeterminate.  2. The right ventricle has normal systolic function. The cavity was normal. There is no increase in right ventricular wall thickness.  3. Left atrial size was moderately dilated.  4. Trivial pericardial effusion is present.  5. The mitral valve is degenerative. Moderate thickening of the mitral valve leaflet. Moderate calcification of the mitral valve leaflet. There is moderate mitral annular calcification present.  6. The tricuspid valve is normal in structure.  7. The aortic valve is tricuspid Moderate thickening of the aortic valve Moderate calcification of the aortic valve. mild stenosis of the aortic valve.  8.  The pulmonic valve was grossly normal. Pulmonic valve regurgitation is mild by color flow Doppler.  9. Right atrial pressure is estimated at 8 mmHg.  Patient Profile     85 y.o. male w/ PMH of CAD (s/p CABG with cath in 2014 showing patent LIMA-LAD with occluded Free Radial to OM1-OM2-PDA and occluded RCA with L-->R collaterals), PVD, ischemic cardiomyopathy, HTN, HLD, and  recently diagnosed atrial fibrillation (diagnosed 12/27/2018 and started on Eliquis) who is currently admitted for an acute CHF exacerbation.   Assessment & Plan    1. Acute on Chronic Combined Systolic and Diastolic CHF - echo this admission shows an EF of 30-35%, which is slightly decreased from 35-40% by echo in 01/2018. He has refused ICD placement in the past.  - has been receiving IV Lasix 60mg  BID with a recorded output of -1.9 L this admission yet weight has declined by 8 lbs (214 --> 206 lbs). Weight recorded as 246 lbs this morning in error. Creatinine remains stable at 1.06. Would check oxygen saturations with ambulation today. Can likely switch back to PO diuretics tomorrow. Was not on Lasix PTA. Would consider 60mg  daily initially with close follow-up as this may need to be further titrated.  - was on Coreg and Entresto for his cardiomyopathy. Entresto just restarted on 2/19 at 24-26mg  BID and titrated to 49-51mg  BID with him initially receiving this dose last night. BP has been well-controlled and could likely titrate back to PTA dosing of 97-103mg  BID once transitioned to PO diuretics.   2. Persistent Atrial Fibrillation - rates remain well-controlled. Continue Coreg 6.25mg  BID.  - he denies any evidence of active bleeding. Hgb stable at 13.1 this AM. Continue Eliquis for anticoagulation. Plans were for consideration of DCCV following 4 weeks of anticoagulation if he remained in atrial fibrillation. Would need to have significant improvement in his respiratory status prior to pursing this. LA was moderately dilated  by echo this admission.   3. CAD - s/p CABG with cath in 2014 showing patent LIMA-LAD with occluded Free Radial to OM1-OM2-PDA and occluded RCA with L-->R collaterals. He denies any recent chest pain and his dyspnea continues to improve.  - continue BB and Zetia. Not on ASA given the need for anticoagulation.   4. HTN - BP well-controlled at 122/55 - 130/88 within the past 24 hours.  - currently on Coreg 6.25mg  BID, Hydralazine 25mg  TID, and Entresto 45-51mg  BID.   5. HLD - has been intolerant to statins and remains on Zetia 10mg  daily.   For questions or updates, please contact [place] Please consult [email or web address] for contact info under Cardiology/STEMI.   Arna Medici , PA-C 9:38 AM 01/15/2019 Pager: [phone]  Attending note  Patient seen and discussed with PA Strader. I agree with her documentation. I/Os incomplete yesterday, he is on IV lasix 60mg  bid. OVerall stable renal function. Weights appear inaccurate. Clinicalyl his symptoms are improving. Entresto initially held on admit due to AKI, restarted however still on lower dose at 49/51mg  bid. Would titrate up to his prior dose once euvolemic if kidney remains stable prior to discharge. Likely can come off hydralazine once back on prior entresto dose. Would plan for lasix 40mg  bid at discharge.   Still signs of fluid overload though improving, conitnue IV diuresis. Work to wean O2 and have patient start ambulating.   Carlyle Dolly MD

## 2019-01-15 NOTE — Care Management Important Message (Signed)
Important Message  Patient Details  Name: Gary Odonnell MRN: [id number] Date of Birth: 05/16/1932   Medicare Important Message Given:  Yes    Sherald Barge, RN 01/15/2019, 1:52 PM

## 2019-01-15 NOTE — Care Management (Signed)
Patient Information   Patient Name Gary Odonnell, Gary Odonnell ([id number]) Sex Male DOB 12-03-1931  Room Bed  A328 A328-01  Patient Demographics   Address [place] [address] Phone [phone] (Home)  Patient Ethnicity & Race   Ethnic Group Patient Race  Not Hispanic or Latino White or Caucasian  Emergency Contact(s)   Name Relation Home Work Mobile  [place] Spouse [phone]    Hart,Christine Daughter   [phone]  Documents on File    Status Date Received Description  Documents for the Patient  [place] Not Received    [place] E-Signature HIPAA Notice of Privacy Received 76/22/63   Driver's License Received 33/54/56   Advance Directives/Living Will/HCPOA/POA Not Received    Insurance Card Not Received  MEDICARE  Insurance Card Not Received  TRICARE  Release of Information Not Received    Release of Information Not Received  [place] ENTERED INFO: [id number]  AMB Correspondence  03/07/14 OFFICE NOTE [facility]  AMB Correspondence  08/23/14 MEDICATIONS AND ORDERS  Insurance Card  11/30/14 MEDICARE/TRICARE  TCTS  AMB Correspondence  06/02/14 OFFICE NOTE [facility]  Other Photo ID Not Received    [facility] Policy for Pain - E [employer], Tricare, 04/13/15 mar  AMB Correspondence  07/10/15 FOLLOW UP NOTE [facility]  AMB Correspondence  10/11/15 LETTER [place] Card   Medicare, Tricare, 11/30/15 mar  Insurance Card Received 01/11/16 [facility]  [place] E-Signature HIPAA Notice of Privacy [place]     Advanced Beneficiary Notice (ABN) Not Received    E-Signature AOB [place] Not Received    Insurance Card   mcr and tcr tre jo 2018  Insurance Card Received 01/20/18 2019 MEDICARE  AMB Provider Completed Forms Received 03/09/18 PRIOR AUTHORIZATION REQUEST [employer] [facility]  Insurance Card Received 09/01/18  Medicare/Tricare//ROSM  Insurance Card Received (Deleted) 08/06/13 medicare card  Insurance Card Received (Deleted) 08/06/13 tricare for life  HIM ROI Authorization (Deleted) 01/17/15   Documents for the Encounter  AOB (Assignment of Insurance Benefits) Not Received    E-signature AOB Signed 01/09/19   MEDICARE RIGHTS Not Received    E-signature Medicare Rights Signed 01/09/19   ED Patient Billing Extract   ED PB Billing Extract  Cardiac Monitoring Strip Shift Summary Received 01/10/19   Medicare Observation     Cardiac Monitoring Strip Received 01/11/19   EKG Received 01/11/19   Admission Information   Current Information   Attending Provider Admitting Provider Admission Type Admission Status  Murlean Iba, MD Vianne Bulls, MD Emergency Admission (Confirmed)       Admission Date/Time Discharge Date Hospital Service Auth/Cert Status  28/76/81 11:51 PM  Internal Medicine [facility] Room/Bed   [facility] AP-DEPT 300 L572/I203-55        Admission   Complaint  Shortness of breath  Hospital Account   Name Acct ID Class Status Primary Coverage  Gary Odonnell, Gary Odonnell [id number] Inpatient Open MEDICARE - MEDICARE PART A AND B      Guarantor Account (for [place] [id number])   Name Relation to Pt Service Area Active? Acct Type  Kaylyn Lim Yes Personal/Family  Address Phone    [place], [place] [address] [phone])        Coverage Information (for Hospital Account [id number])   1. [place] PART A  AND B   F/O Payor/Plan Precert #  MEDICARE/MEDICARE PART A AND B   Subscriber Subscriber #  Zac, Torti [id number]  Address Phone  [address] [place], [place] [address]   2. TRICARE/TRICARE FOR LIFE   F/O Payor/Plan Precert #  TRICARE/TRICARE FOR LIFE   Subscriber Subscriber #  Anav, [place] [id number]  Address Phone  [place] [address] [phone]       Care  Everywhere ID:  [id number]

## 2019-01-16 ENCOUNTER — Encounter (HOSPITAL_COMMUNITY): Payer: Self-pay | Admitting: Family Medicine

## 2019-01-16 LAB — GLUCOSE, CAPILLARY
Glucose-Capillary: 230 mg/dL — ABNORMAL HIGH (ref 70–99)
Glucose-Capillary: 245 mg/dL — ABNORMAL HIGH (ref 70–99)
Glucose-Capillary: 179 mg/dL — ABNORMAL HIGH (ref 70–99)
Glucose-Capillary: 56 mg/dL — ABNORMAL LOW (ref 70–99)
Glucose-Capillary: 76 mg/dL (ref 70–99)

## 2019-01-16 LAB — RENAL FUNCTION PANEL
Albumin: 3.2 g/dL — ABNORMAL LOW (ref 3.5–5.0)
Anion gap: 10 (ref 5–15)
BUN: 40 mg/dL — ABNORMAL HIGH (ref 8–23)
CO2: 36 mmol/L — ABNORMAL HIGH (ref 22–32)
CREATININE: 1.39 mg/dL — AB (ref 0.61–1.24)
Calcium: 8.8 mg/dL — ABNORMAL LOW (ref 8.9–10.3)
Chloride: 98 mmol/L (ref 98–111)
GFR calc Af Amer: 54 mL/min — ABNORMAL LOW (ref >60)
GFR calc non Af Amer: 46 mL/min — ABNORMAL LOW (ref >60)
Glucose, Bld: 269 mg/dL — ABNORMAL HIGH (ref 70–99)
Phosphorus: 2 mg/dL — ABNORMAL LOW (ref 2.5–4.6)
Potassium: 3.3 mmol/L — ABNORMAL LOW (ref 3.5–5.1)
Sodium: 144 mmol/L (ref 135–145)

## 2019-01-16 LAB — BASIC METABOLIC PANEL
Anion gap: 11 (ref 5–15)
BUN: 41 mg/dL — ABNORMAL HIGH (ref 8–23)
CO2: 35 mmol/L — ABNORMAL HIGH (ref 22–32)
Calcium: 9.3 mg/dL (ref 8.9–10.3)
Chloride: 98 mmol/L (ref 98–111)
Creatinine, Ser: 1.31 mg/dL — ABNORMAL HIGH (ref 0.61–1.24)
GFR calc Af Amer: 58 mL/min — ABNORMAL LOW (ref >60)
GFR calc non Af Amer: 50 mL/min — ABNORMAL LOW (ref >60)
Glucose, Bld: 149 mg/dL — ABNORMAL HIGH (ref 70–99)
Potassium: 3.3 mmol/L — ABNORMAL LOW (ref 3.5–5.1)
Sodium: 144 mmol/L (ref 135–145)

## 2019-01-16 MED ORDER — POTASSIUM CHLORIDE CRYS ER 20 MEQ PO TBCR
40.0000 meq | EXTENDED_RELEASE_TABLET | Freq: Once | ORAL | Status: AC
  Administered 2019-01-16: 40 meq via ORAL
  Filled 2019-01-16: qty 2

## 2019-01-16 MED ORDER — SODIUM CHLORIDE 0.9 % IV BOLUS
250.0000 mL | Freq: Once | INTRAVENOUS | Status: AC
  Administered 2019-01-16: 250 mL via INTRAVENOUS

## 2019-01-16 MED ORDER — POTASSIUM CHLORIDE IN NACL 20-0.9 MEQ/L-% IV SOLN
INTRAVENOUS | Status: AC
  Administered 2019-01-16: 15:00:00 via INTRAVENOUS

## 2019-01-16 MED ORDER — INSULIN GLARGINE 100 UNIT/ML ~~LOC~~ SOLN
10.0000 [IU] | Freq: Every day | SUBCUTANEOUS | Status: DC
  Filled 2019-01-16 (×2): qty 0.1

## 2019-01-16 MED ORDER — OSELTAMIVIR PHOSPHATE 30 MG PO CAPS
30.0000 mg | ORAL_CAPSULE | Freq: Every day | ORAL | Status: DC
  Administered 2019-01-16 – 2019-01-18 (×3): 30 mg via ORAL
  Filled 2019-01-16 (×3): qty 1

## 2019-01-16 MED ORDER — INSULIN ASPART 100 UNIT/ML ~~LOC~~ SOLN
7.0000 [IU] | Freq: Three times a day (TID) | SUBCUTANEOUS | Status: DC
  Administered 2019-01-16: 7 [IU] via SUBCUTANEOUS

## 2019-01-16 NOTE — Progress Notes (Signed)
PROGRESS NOTE  Gary Odonnell  NKN:[id number] DOB: 10-20-1932 DOA: 01/09/2019 PCP: Glenda Chroman, MD   Brief Narrative:  85 year old male with a history of chronic combined CHF with ejection fraction 35%, COPD, atrial fibrillation on anticoagulation, hypertension and diabetes, presented to the hospital with complaints of shortness of breath, cough and wheezing.  He was recently discharged from the hospital after being treated for COPD exacerbation with pneumonia.  Initially felt better on discharge, but shortly after returning home, the following day, he began to feel more short of breath and needed to come back to the hospital.  Work-up in the emergency room indicated an elevated BNP.  CT scan of the chest showed bilateral pleural effusions.  He was admitted to the hospital for further management of CHF exacerbation.  Assessment & Plan:   Principal Problem:   Pneumonia Active Problems:   HTN (hypertension)   Chronic combined systolic and diastolic CHF (congestive heart failure) (HCC)   CAD (coronary atherosclerotic disease)   COPD with acute exacerbation (HCC)   A-fib (HCC)   Acute renal failure superimposed on stage 3 chronic kidney disease (HCC)   Diet-controlled diabetes mellitus (HCC)   CHF (congestive heart failure) (HCC)   Acute on chronic systolic heart failure ([place])  1. Acute on chronic combined CHF.  Echocardiogram repeated and shows that EF is unchanged from 01/2018 at 35 to 40%.  He has evidence of bilateral pleural effusions.  Cardiology following and he was diuresed on IV Lasix.  Monitor urine output.  Entresto held due to AKI.  Hold lasix today.  Likely can resume oral lasix on Monday.  2. Persistent Atrial fibrillation.  Currently rate controlled.  Continue Coreg and Eliquis. 3. Coronary artery disease.  Mildly elevated troponin likely related strain.  No evidence of ACS.  No chest pain. 4. Acute kidney injury on chronic kidney disease stage III.  Creatinine 1.6 on admission.    Continue to monitor in the setting of diuresis.  Baseline creatinine 1.4-1.6.  Hold entresto today, resume at discharge.    5. COPD Exacerbation. Currently on Solu-Medrol, nebulizer treatments and pulmonary hygiene.  If breathing continues to improve, Continuing to wean steroids.  6. Tobacco abuse - Pt was counseled at length about stopping all cigarette use, especially if he is wearing oxygen, he and wife verbalized understanding.   7. Type 2 Diabetes.  Steroid induced hyperglycemia.  Blood sugars currently elevated in the setting of steroids.  Continue on sliding scale insulin.  Added prandial coverage.  8. Mechanical fall.  Patient slipped while he was in the bathroom.  X-ray of ribs was unrevealing.  He also complained of back pain and T-L-spine x-rays were obtained.  This confirmed remote T12 compression fracture.  Patient reports he has had this fracture for approximately 20 years.  Spoke with daughter who was concerned about going home.  PT came and reassessed him today and recommending HHPT.    DVT prophylaxis: Apixaban Code Status: DNR Family Communication: Discussed with wife at the bedside, daughter by telephone Disposition Plan: Discharge home tomorrow if stable  Consultants:   Cardiology  Procedures:  Echo: 1. The left ventricle has moderate-severely reduced systolic function, with an ejection fraction of 30-35%. The cavity size was mildly dilated. There is moderately increased left ventricular wall thickness. Left ventricular diastolic Doppler parameters are indeterminate.  2. The right ventricle has normal systolic function. The cavity was normal. There is no increase in right ventricular wall thickness.  3. Left atrial size was moderately  dilated.  4. Trivial pericardial effusion is present.  5. The mitral valve is degenerative. Moderate thickening of the mitral valve leaflet. Moderate calcification of the mitral valve leaflet. There is moderate mitral annular calcification  present.  6. The tricuspid valve is normal in structure.  7. The aortic valve is tricuspid Moderate thickening of the aortic valve Moderate calcification of the aortic valve. mild stenosis of the aortic valve.  8. The pulmonic valve was grossly normal. Pulmonic valve regurgitation is mild by color flow Doppler.   9. Right atrial pressure is estimated at 8 mmHg.  Antimicrobials:   Vancomycin 2/16 > 2/17  Cefepime 2/16 > 2/17  Subjective: Pt says that he wants to go home. He denies chest pain and SOB.   Objective: Vitals:   01/15/19 2042 01/15/19 2128 01/16/19 0549 01/16/19 0836  BP:  132/73 (!) 147/90   Pulse:  71 85   Resp:  18 18   Temp:  98.2 F (36.8 C) (!) 97.4 F (36.3 C)   TempSrc:  Oral Oral   SpO2: 96% 96% 96% 98%  Weight:   92 kg   Height:        Intake/Output Summary (Last 24 hours) at 01/16/2019 1347 Last data filed at 01/16/2019 0600 Gross per 24 hour  Intake -  Output 850 ml  Net -850 ml   Filed Weights   01/14/19 0552 01/15/19 0517 01/16/19 0549  Weight: 93.5 kg 111.7 kg 92 kg   Examination:  General exam: Alert, awake, oriented x 3 Respiratory system: BBS with expiratory wheezes.  Respiratory effort normal. Cardiovascular system:normal s1,s2 sounds. No murmurs, rubs, gallops. Gastrointestinal system: Abdomen is nondistended, soft and nontender. No organomegaly or masses felt. Normal bowel sounds heard. Central nervous system: Alert and oriented. No focal neurological deficits. Extremities: no edema BLEs.   Skin: No rashes, lesions or ulcers Psychiatry: Judgement and insight appear normal. Mood & affect appropriate.    Data Reviewed: I have personally reviewed following labs and imaging studies  CBC: Recent Labs  Lab 01/10/19 0013 01/11/19 0615 01/15/19 0547  WBC 8.5 10.4 12.1*  NEUTROABS 6.6 9.9*  --   HGB 13.4 12.6* 13.1  HCT 46.5 43.1 45.2  MCV 102.2* 99.3 99.1  PLT 122* 121* 409*   Basic Metabolic Panel: Recent Labs  Lab  01/13/19 0524 01/14/19 0607 01/15/19 0547 01/16/19 0607 01/16/19 1238  NA 142 141 142 144 144  K 4.0 3.9 3.6 3.3* 3.3*  CL 104 99 98 98 98  CO2 30 31 35* 35* 36*  GLUCOSE 192* 213* 177* 149* 269*  BUN 35* 32* 39* 41* 40*  CREATININE 1.06 1.08 1.06 1.31* 1.39*  CALCIUM 9.3 9.0 9.4 9.3 8.8*  MG  --   --  2.2  --   --   PHOS  --   --   --   --  2.0*   GFR: Estimated Creatinine Clearance: 45.9 mL/min (A) (by C-G formula based on SCr of 1.39 mg/dL (H)). Liver Function Tests: Recent Labs  Lab 01/10/19 0013 01/11/19 0615 01/16/19 1238  AST 23 15  --   ALT 43 39  --   ALKPHOS 56 51  --   BILITOT 1.5* 1.9*  --   PROT 6.8 6.2*  --   ALBUMIN 3.6 3.5 3.2*   No results for input(s): LIPASE, AMYLASE in the last 168 hours. No results for input(s): AMMONIA in the last 168 hours. Coagulation Profile: No results for input(s): INR, PROTIME in the last 168  hours. Cardiac Enzymes: Recent Labs  Lab 01/10/19 0013 01/10/19 0350 01/10/19 1325 01/10/19 1752  TROPONINI 0.12* 0.09* 0.07* 0.08*   BNP (last 3 results) No results for input(s): PROBNP in the last 8760 hours. HbA1C: No results for input(s): HGBA1C in the last 72 hours. CBG: Recent Labs  Lab 01/15/19 1118 01/15/19 1717 01/15/19 2148 01/16/19 1026 01/16/19 1206  GLUCAP 343* 211* 230* 230* 245*   Lipid Profile: No results for input(s): CHOL, HDL, LDLCALC, TRIG, CHOLHDL, LDLDIRECT in the last 72 hours. Thyroid Function Tests: No results for input(s): TSH, T4TOTAL, FREET4, T3FREE, THYROIDAB in the last 72 hours. Anemia Panel: No results for input(s): VITAMINB12, FOLATE, FERRITIN, TIBC, IRON, RETICCTPCT in the last 72 hours. Sepsis Labs: Recent Labs  Lab 01/10/19 0136 01/10/19 0347 01/10/19 1754 01/11/19 0615  PROCALCITON  --   --  <0.10 <0.10  LATICACIDVEN 1.6 2.4*  --   --     Recent Results (from the past 240 hour(s))  Blood culture (routine x 2)     Status: None   Collection Time: 01/10/19  1:09 AM  Result  Value Ref Range Status   Specimen Description BLOOD RIGHT FOREARM  Final   Special Requests   Final    BOTTLES DRAWN AEROBIC AND ANAEROBIC Blood Culture adequate volume   Culture   Final    NO GROWTH 5 DAYS Performed at [facility], [address] [place], [place] [address]    Report Status 01/15/2019 FINAL  Final  Blood culture (routine x 2)     Status: None   Collection Time: 01/10/19  1:36 AM  Result Value Ref Range Status   Specimen Description LEFT ANTECUBITAL  Final   Special Requests   Final    BOTTLES DRAWN AEROBIC AND ANAEROBIC Blood Culture adequate volume   Culture   Final    NO GROWTH 5 DAYS Performed at [facility], [address], [place], [place] [address]    Report Status 01/15/2019 FINAL  Final      Radiology Studies: Dg Chest Port 1 View  Result Date: 01/15/2019 CLINICAL DATA:  Leukocytosis. EXAM: PORTABLE CHEST 1 VIEW COMPARISON:  Chest CT 01/10/2019 FINDINGS: Cardiomediastinal silhouette is stably enlarged. Mediastinal contours appear intact. There is no significant change in the appearance of the chest with bilateral pleural effusions and lower lobe atelectasis versus airspace consolidation. Osseous structures are without acute abnormality. Soft tissues are grossly normal. IMPRESSION: No significant change in the appearance of the chest with bilateral pleural effusions and lower lobes atelectasis versus airspace consolidation. Electronically Signed   By: Fidela Salisbury M.D.   On: 01/15/2019 08:24   Scheduled Meds: . apixaban  5 mg Oral BID  . budesonide (PULMICORT) nebulizer solution  0.25 mg Nebulization BID  . carvedilol  6.25 mg Oral BID WC  . ezetimibe  10 mg Oral Daily  . fluticasone  2 spray Each Nare Daily  . guaiFENesin  1,200 mg Oral BID  . hydrALAZINE  25 mg Oral Q8H  . insulin aspart  0-5 Units Subcutaneous QHS  . insulin aspart  0-9 Units Subcutaneous TID WC  . insulin aspart  7 Units Subcutaneous TID WC  . ipratropium-albuterol  3 mL  Nebulization Q6H WA  . methylPREDNISolone (SOLU-MEDROL) injection  40 mg Intravenous Q24H  . nicotine  14 mg Transdermal Daily  . oseltamivir  30 mg Oral Daily  . sodium chloride flush  3 mL Intravenous Q12H  . sodium chloride flush  3 mL Intravenous Q12H  Continuous Infusions: . sodium chloride 250 mL (01/10/19 2201)    LOS: 5 days   Time spent: 24 mins  Clanford Wynetta Emery, MD [facility]  If 7PM-7AM, please contact night-coverage [email or web address]  01/16/2019, 1:47 PM

## 2019-01-16 NOTE — Progress Notes (Signed)
Hypoglycemic Event  CBG: 56  Treatment: 15 gm snack  Symptoms: no symptoms  Follow-up CBG: Time:2135 CBG Result:76  Possible Reasons for Event: unsure, pt ate dinner  Comments/MD notified:Dr. Silas [place] hold Lantus dose tonight    Gary Odonnell, Gary Odonnell

## 2019-01-16 NOTE — Progress Notes (Signed)
Physical Therapy Treatment Patient Details Name: Gary Odonnell MRN: [id number] DOB: 11-06-1932 Today's Date: 01/16/2019    History of Present Illness Gary Odonnell is a 85 y.o. male with medical history significant for coronary artery disease, type 2 diabetes mellitus, hypertension, atrial fibrillation on Eliquis, COPD, and chronic combined systolic and diastolic CHF, now presenting to the emergency department for evaluation of increasing shortness of breath, cough, and wheezing.  Patient was just discharged from the hospital after treatment for pneumonia with Rocephin and azithromycin.  Patient developed a productive cough and increased shortness of breath approximately 2 weeks ago, was started on Augmentin in the outpatient setting, continued to worsen and developed significant wheezing, and was admitted to the hospital on 01/05/2019, started on Rocephin and azithromycin, and was discharged with oxygen on 01/08/2019 in improved condition.  Unfortunately, over the past 24 hours or so, his shortness of breath has worsened again and he has begun to wheeze again as well.  He denies any lower extremity edema, denies any chest pain, and has not noted any fevers or chills.    PT Comments    Patient demonstrates increased endurance/distance for gait training, had one stumble and able to recover without loss of balance, followed with wheelchair for rest breaks, demonstrates good return for going up/down steps in stair with his son present for family training with good return demonstrated and understanding acknowledged.   Patient on 2 LPM O2 during ambulation with O2 saturation remaining between 94-97% and tolerated sitting up in chair with family present after therapy.  Patient will benefit from continued physical therapy in hospital and recommended venue below to increase strength, balance, endurance for safe ADLs and gait.   Follow Up Recommendations  Home health PT;Supervision for mobility/OOB;Supervision  - Intermittent     Equipment Recommendations  None recommended by PT    Recommendations for Other Services       Precautions / Restrictions Precautions Precautions: Fall Precaution Comments: monitior O2 Restrictions Weight Bearing Restrictions: No    Mobility  Bed Mobility Overal bed mobility: Modified Independent             General bed mobility comments: with head of bed flat, slightly increased time  Transfers Overall transfer level: Needs assistance Equipment used: Straight cane Transfers: Sit to/from Stand;Stand Pivot Transfers Sit to Stand: Supervision Stand pivot transfers: Supervision       General transfer comment: demonstrates improvement for use of SPC during sit to stands from bedside and transferring to commode in bathroom  Ambulation/Gait Ambulation/Gait assistance: Supervision;Min guard Gait Distance (Feet): 50 Feet Assistive device: Straight cane Gait Pattern/deviations: Step-through pattern;Decreased step length - right;Decreased step length - left;Decreased stride length Gait velocity: decreased   General Gait Details: slightly labored cadence with occasional stumbling without loss of balance, followed with w/c for resting, on 2 LPM with O2 saturation between 95-97%   Stairs Stairs: Yes Stairs assistance: Min guard Stair Management: One rail Right;One rail Left;Step to pattern;With cane Number of Stairs: 4 General stair comments: demonstrates good return for going up/down steps using 1 siderail and cane with step to pattern without loss of balance, limited number of steps due to connected to IV pole   Wheelchair Mobility    Modified Rankin (Stroke Patients Only)       Balance Overall balance assessment: Needs assistance Sitting-balance support: Feet supported;No upper extremity supported Sitting balance-Leahy Scale: Good Sitting balance - Comments: seated EOB   Standing balance support: Single extremity supported;During functional  activity Standing  balance-Leahy Scale: Fair Standing balance comment: with SPC                            Cognition Arousal/Alertness: Awake/alert Behavior During Therapy: WFL for tasks assessed/performed Overall Cognitive Status: Within Functional Limits for tasks assessed                                        Exercises      General Comments        Pertinent Vitals/Pain Pain Assessment: No/denies pain    Home Living                      Prior Function            PT Goals (current goals can now be found in the care plan section) Acute Rehab PT Goals Patient Stated Goal: return home with family to assist PT Goal Formulation: With patient Time For Goal Achievement: 01/18/19 Potential to Achieve Goals: Good Progress towards PT goals: Progressing toward goals    Frequency    Min 3X/week      PT Plan Current plan remains appropriate    Co-evaluation              AM-PAC PT "6 Clicks" Mobility   Outcome Measure  Help needed turning from your back to your side while in a flat bed without using bedrails?: None Help needed moving from lying on your back to sitting on the side of a flat bed without using bedrails?: None Help needed moving to and from a bed to a chair (including a wheelchair)?: None Help needed standing up from a chair using your arms (e.g., wheelchair or bedside chair)?: None Help needed to walk in hospital room?: A Little Help needed climbing 3-5 steps with a railing? : A Little 6 Click Score: 22    End of Session Equipment Utilized During Treatment: Gait belt;Oxygen Activity Tolerance: Patient tolerated treatment well;Patient limited by fatigue Patient left: in chair;with call bell/phone within reach;with family/visitor present Nurse Communication: Mobility status PT Visit Diagnosis: Unsteadiness on feet (R26.81);Muscle weakness (generalized) (M62.81);Other abnormalities of gait and mobility (R26.89)      Time: 7939-0300 PT Time Calculation (min) (ACUTE ONLY): 36 min  Charges:  $Gait Training: 8-22 mins $Therapeutic Activity: 8-22 mins                     12:04 PM, 01/16/19 Lonell Grandchild, MPT Physical Therapist with [facility] [phone] office [phone] mobile phone

## 2019-01-17 LAB — BASIC METABOLIC PANEL
Anion gap: 8 (ref 5–15)
BUN: 38 mg/dL — ABNORMAL HIGH (ref 8–23)
CHLORIDE: 101 mmol/L (ref 98–111)
CO2: 35 mmol/L — ABNORMAL HIGH (ref 22–32)
Calcium: 9.1 mg/dL (ref 8.9–10.3)
Creatinine, Ser: 1.23 mg/dL (ref 0.61–1.24)
GFR calc Af Amer: >60 mL/min (ref >60)
GFR calc non Af Amer: 54 mL/min — ABNORMAL LOW (ref >60)
Glucose, Bld: 135 mg/dL — ABNORMAL HIGH (ref 70–99)
Potassium: 3.8 mmol/L (ref 3.5–5.1)
SODIUM: 144 mmol/L (ref 135–145)

## 2019-01-17 LAB — GLUCOSE, CAPILLARY
Glucose-Capillary: 229 mg/dL — ABNORMAL HIGH (ref 70–99)
Glucose-Capillary: 292 mg/dL — ABNORMAL HIGH (ref 70–99)
Glucose-Capillary: 239 mg/dL — ABNORMAL HIGH (ref 70–99)

## 2019-01-17 LAB — MAGNESIUM: Magnesium: 2.1 mg/dL (ref 1.7–2.4)

## 2019-01-17 MED ORDER — FUROSEMIDE 40 MG PO TABS
40.0000 mg | ORAL_TABLET | Freq: Two times a day (BID) | ORAL | Status: DC
  Administered 2019-01-17 – 2019-01-18 (×3): 40 mg via ORAL
  Filled 2019-01-17 (×3): qty 1

## 2019-01-17 MED ORDER — PREDNISONE 20 MG PO TABS
40.0000 mg | ORAL_TABLET | Freq: Every day | ORAL | Status: DC
  Administered 2019-01-17 – 2019-01-18 (×2): 40 mg via ORAL
  Filled 2019-01-17 (×2): qty 2

## 2019-01-17 MED ORDER — POTASSIUM CHLORIDE CRYS ER 20 MEQ PO TBCR
20.0000 meq | EXTENDED_RELEASE_TABLET | Freq: Two times a day (BID) | ORAL | Status: DC
  Administered 2019-01-17 – 2019-01-18 (×3): 20 meq via ORAL
  Filled 2019-01-17 (×3): qty 1

## 2019-01-17 MED ORDER — INSULIN ASPART 100 UNIT/ML ~~LOC~~ SOLN: 5.0000 [IU] | Freq: Three times a day (TID) | SUBCUTANEOUS | Status: DC

## 2019-01-17 MED ORDER — INSULIN ASPART 100 UNIT/ML ~~LOC~~ SOLN
3.0000 [IU] | Freq: Three times a day (TID) | SUBCUTANEOUS | Status: DC
  Administered 2019-01-17 – 2019-01-18 (×5): 3 [IU] via SUBCUTANEOUS

## 2019-01-17 MED ORDER — SACUBITRIL-VALSARTAN 49-51 MG PO TABS
1.0000 | ORAL_TABLET | Freq: Two times a day (BID) | ORAL | Status: DC
  Administered 2019-01-17 – 2019-01-18 (×3): 1 via ORAL
  Filled 2019-01-17 (×3): qty 1

## 2019-01-17 NOTE — Progress Notes (Signed)
PROGRESS NOTE  Gary Odonnell  EXH:[id number] DOB: 04/07/1932 DOA: 01/09/2019 PCP: Glenda Chroman, MD   Brief Narrative:  85 year old male with a history of chronic combined CHF with ejection fraction 35%, COPD, atrial fibrillation on anticoagulation, hypertension and diabetes, presented to the hospital with complaints of shortness of breath, cough and wheezing.  He was recently discharged from the hospital after being treated for COPD exacerbation with pneumonia.  Initially felt better on discharge, but shortly after returning home, the following day, he began to feel more short of breath and needed to come back to the hospital.  Work-up in the emergency room indicated an elevated BNP.  CT scan of the chest showed bilateral pleural effusions.  He was admitted to the hospital for further management of CHF exacerbation.  Assessment & Plan:   Principal Problem:   Pneumonia Active Problems:   HTN (hypertension)   Chronic combined systolic and diastolic CHF (congestive heart failure) (HCC)   CAD (coronary atherosclerotic disease)   COPD with acute exacerbation (HCC)   A-fib (HCC)   Acute renal failure superimposed on stage 3 chronic kidney disease (HCC)   Diet-controlled diabetes mellitus (HCC)   CHF (congestive heart failure) (HCC)   Acute on chronic systolic heart failure ([place])  1. Acute on chronic combined CHF.  Echocardiogram repeated and shows that EF is unchanged from 01/2018 at 35 to 40%.  He has evidence of bilateral pleural effusions.  Cardiology following and he was diuresed on IV Lasix until creatinine started bumping.  Continue to monitor urine output.  Continue entresto, have been unable to titrate entresto due to renal insufficiency.   Lasix 40 mg BID ordered.  2. Persistent Atrial fibrillation.  Currently rate controlled.  Continue Coreg and Eliquis. 3. Coronary artery disease.  Mildly elevated troponin likely related strain.  No evidence of ACS.  No chest pain. 4. Acute kidney  injury on chronic kidney disease stage III.  Creatinine 1.6 on admission.   Continue to monitor in the setting of diuresis.  Baseline creatinine 1.4-1.6.     5. Urinary retention - bladder feels distended and full on exam, nursing unable to place [place] cath, urology consult requested.  6. COPD Exacerbation. Initially treated with Solu-Medrol, nebulizer treatments and pulmonary hygiene.  Now he is on oral steroids.    7. Tobacco abuse - Pt was counseled at length about stopping all cigarette use, especially if he is wearing oxygen, he and wife verbalized understanding.   8. Type 2 Diabetes.  Steroid induced hyperglycemia.  He had a low BS last night, reducing insulin doses.  Follow.   9. Mechanical fall.  Patient slipped while he was in the bathroom.  X-ray of ribs was unrevealing.  He also complained of back pain and T-L-spine x-rays were obtained.  This confirmed remote T12 compression fracture.  Patient reports he has had this fracture for approximately 20 years.  Spoke with daughter who was concerned about going home.  PT came and reassessed him today and recommending HHPT.    DVT prophylaxis: Apixaban Code Status: DNR Family Communication: Discussed with wife and son at the bedside, daughter by telephone Disposition Plan: continue inpatient management  Consultants:   Cardiology  Procedures:  Echo: 1. The left ventricle has moderate-severely reduced systolic function, with an ejection fraction of 30-35%. The cavity size was mildly dilated. There is moderately increased left ventricular wall thickness. Left ventricular diastolic Doppler parameters are indeterminate.  2. The right ventricle has normal systolic function. The cavity was  normal. There is no increase in right ventricular wall thickness.  3. Left atrial size was moderately dilated.  4. Trivial pericardial effusion is present.  5. The mitral valve is degenerative. Moderate thickening of the mitral valve leaflet. Moderate  calcification of the mitral valve leaflet. There is moderate mitral annular calcification present.  6. The tricuspid valve is normal in structure.  7. The aortic valve is tricuspid Moderate thickening of the aortic valve Moderate calcification of the aortic valve. mild stenosis of the aortic valve.  8. The pulmonic valve was grossly normal. Pulmonic valve regurgitation is mild by color flow Doppler.   9. Right atrial pressure is estimated at 8 mmHg.  Antimicrobials:   Vancomycin 2/16 > 2/17  Cefepime 2/16 > 2/17  Subjective: Pt says he feels more SOB today and his bladder feels full, difficult time urinating only small amount of dribble coming out.    Objective: Vitals:   01/16/19 2205 01/17/19 0503 01/17/19 0518 01/17/19 0756  BP:   (!) 153/72   Pulse:   78   Resp:   18   Temp:   98.1 F (36.7 C)   TempSrc:   Oral   SpO2: 98% 98% 99% 93%  Weight:      Height:        Intake/Output Summary (Last 24 hours) at 01/17/2019 1001 Last data filed at 01/16/2019 2006 Gross per 24 hour  Intake 469.91 ml  Output 425 ml  Net 44.91 ml   Filed Weights   01/14/19 0552 01/15/19 0517 01/16/19 0549  Weight: 93.5 kg 111.7 kg 92 kg   Examination:  General exam: Alert, awake, oriented x 3 Respiratory system: BBS with expiratory wheezes.  Respiratory effort normal. Cardiovascular system:normal s1,s2 sounds. No murmurs, rubs, gallops. Gastrointestinal system: bladder feels distended and full.  Abdomen is soft and nontender. No organomegaly or masses felt. Normal bowel sounds heard. Central nervous system: Alert and oriented. No focal neurological deficits. Extremities: no edema BLEs.   Skin: No rashes, lesions or ulcers Psychiatry: Judgement and insight appear normal. Mood & affect appropriate.   Data Reviewed: I have personally reviewed following labs and imaging studies  CBC: Recent Labs  Lab 01/11/19 0615 01/15/19 0547  WBC 10.4 12.1*  NEUTROABS 9.9*  --   HGB 12.6* 13.1  HCT  43.1 45.2  MCV 99.3 99.1  PLT 121* 353*   Basic Metabolic Panel: Recent Labs  Lab 01/14/19 0607 01/15/19 0547 01/16/19 0607 01/16/19 1238 01/17/19 0605  NA 141 142 144 144 144  K 3.9 3.6 3.3* 3.3* 3.8  CL 99 98 98 98 101  CO2 31 35* 35* 36* 35*  GLUCOSE 213* 177* 149* 269* 135*  BUN 32* 39* 41* 40* 38*  CREATININE 1.08 1.06 1.31* 1.39* 1.23  CALCIUM 9.0 9.4 9.3 8.8* 9.1  MG  --  2.2  --   --  2.1  PHOS  --   --   --  2.0*  --    GFR: Estimated Creatinine Clearance: 51.9 mL/min (by C-G formula based on SCr of 1.23 mg/dL). Liver Function Tests: Recent Labs  Lab 01/11/19 0615 01/16/19 1238  AST 15  --   ALT 39  --   ALKPHOS 51  --   BILITOT 1.9*  --   PROT 6.2*  --   ALBUMIN 3.5 3.2*   No results for input(s): LIPASE, AMYLASE in the last 168 hours. No results for input(s): AMMONIA in the last 168 hours. Coagulation Profile: No results for input(s):  INR, PROTIME in the last 168 hours. Cardiac Enzymes: Recent Labs  Lab 01/10/19 1325 01/10/19 1752  TROPONINI 0.07* 0.08*   BNP (last 3 results) No results for input(s): PROBNP in the last 8760 hours. HbA1C: No results for input(s): HGBA1C in the last 72 hours. CBG: Recent Labs  Lab 01/16/19 1026 01/16/19 1206 01/16/19 1651 01/16/19 2113 01/16/19 2135  GLUCAP 230* 245* 179* 56* 76   Lipid Profile: No results for input(s): CHOL, HDL, LDLCALC, TRIG, CHOLHDL, LDLDIRECT in the last 72 hours. Thyroid Function Tests: No results for input(s): TSH, T4TOTAL, FREET4, T3FREE, THYROIDAB in the last 72 hours. Anemia Panel: No results for input(s): VITAMINB12, FOLATE, FERRITIN, TIBC, IRON, RETICCTPCT in the last 72 hours. Sepsis Labs: Recent Labs  Lab 01/10/19 1754 01/11/19 0615  PROCALCITON <0.10 <0.10    Recent Results (from the past 240 hour(s))  Blood culture (routine x 2)     Status: None   Collection Time: 01/10/19  1:09 AM  Result Value Ref Range Status   Specimen Description BLOOD RIGHT FOREARM  Final     Special Requests   Final    BOTTLES DRAWN AEROBIC AND ANAEROBIC Blood Culture adequate volume   Culture   Final    NO GROWTH 5 DAYS Performed at [facility], [address] [place], [place] [address]    Report Status 01/15/2019 FINAL  Final  Blood culture (routine x 2)     Status: None   Collection Time: 01/10/19  1:36 AM  Result Value Ref Range Status   Specimen Description LEFT ANTECUBITAL  Final   Special Requests   Final    BOTTLES DRAWN AEROBIC AND ANAEROBIC Blood Culture adequate volume   Culture   Final    NO GROWTH 5 DAYS Performed at [facility], [address], [place], [place] [address]    Report Status 01/15/2019 FINAL  Final    Radiology Studies: No results found. Scheduled Meds: . apixaban  5 mg Oral BID  . budesonide (PULMICORT) nebulizer solution  0.25 mg Nebulization BID  . carvedilol  6.25 mg Oral BID WC  . ezetimibe  10 mg Oral Daily  . fluticasone  2 spray Each Nare Daily  . furosemide  40 mg Oral BID  . guaiFENesin  1,200 mg Oral BID  . hydrALAZINE  25 mg Oral Q8H  . insulin aspart  0-5 Units Subcutaneous QHS  . insulin aspart  0-9 Units Subcutaneous TID WC  . insulin aspart  3 Units Subcutaneous TID WC  . ipratropium-albuterol  3 mL Nebulization Q6H WA  . nicotine  14 mg Transdermal Daily  . oseltamivir  30 mg Oral Daily  . potassium chloride  20 mEq Oral BID  . predniSONE  40 mg Oral Q breakfast  . sodium chloride flush  3 mL Intravenous Q12H  . sodium chloride flush  3 mL Intravenous Q12H   Continuous Infusions: . sodium chloride 250 mL (01/10/19 2201)    LOS: 6 days   Time spent: 33 mins   , MD [facility]  If 7PM-7AM, please contact night-coverage [email or web address]  01/17/2019, 10:01 AM

## 2019-01-17 NOTE — Consult Note (Signed)
Urology Consult   Physician requesting consult: Dr. Wynetta Emery  Reason for consult: Difficult urethral catheterization  History of Present Illness: Gary Odonnell is a 85 y.o. admitted for an acute CHF exacerbation.  He has noted increased urinary symptoms including frequency, weak stream, and incontinence for the last 3-4 days.  He has no prior history of urologic surgery or trauma.  Attempts by the nursing staff were unsuccessful with resistance met at the distal urethra.    Past Medical History:  Diagnosis Date  . Cancer ([place])    L lung  . Coronary artery disease   . Hypercholesteremia   . Hypertension   . PAD (peripheral artery disease) ([place])   . Pneumonia 09/2013  . Radiation april 2016  . Solitary pulmonary nodule on lung CT 12/13/13   11/15/14  . Stroke [facility]) 11/2008    Past Surgical History:  Procedure Laterality Date  . Angioplasty to Left femoral artery    . CARPAL TUNNEL RELEASE Left 05/11/2014   Procedure: LEFT CARPAL TUNNEL RELEASE;  Surgeon: Carole Civil, MD;  Location: [facility];  Service: Orthopedics;  Laterality: Left;  . COLONOSCOPY N/A 01/24/2015   Procedure: COLONOSCOPY;  Surgeon: Danie Binder, MD;  Location: [facility];  Service: Endoscopy;  Laterality: N/A;  130  . CORONARY ARTERY BYPASS GRAFT     x4  . FEMORAL-POPLITEAL BYPASS GRAFT Right 10/23/2015   Procedure: BYPASS GRAFT FEMORAL TO BELOW KNEE POPLITEAL ARTERY USING RIGHT NON-REVERSED GREATER SAPPHENOUS VEIN;  Surgeon: [name], MD;  Location: [place];  Service: Vascular;  Laterality: Right;  . INTRAOPERATIVE ARTERIOGRAM Right 10/23/2015   Procedure: INTRA OPERATIVE ARTERIOGRAM - RIGHT LOWER LEG;  Surgeon: [name], MD;  Location: [place];  Service: Vascular;  Laterality: Right;  . KNEE ARTHROSCOPY Right   . LEFT AND RIGHT HEART CATHETERIZATION WITH CORONARY ANGIOGRAM N/A 09/29/2013   Procedure: LEFT AND RIGHT HEART CATHETERIZATION WITH CORONARY ANGIOGRAM;  Surgeon: Burnell Blanks,  MD;  Location: [facility] CATH LAB;  Service: Cardiovascular;  Laterality: N/A;  . PERIPHERAL VASCULAR CATHETERIZATION N/A 04/21/2015   Procedure: Abdominal Aortogram w/Lower Extremity;  Surgeon: [name], MD;  Location: [place] CV LAB;  Service: Cardiovascular;  Laterality: N/A;  . Stent to right femoral artery    . TRIGGER FINGER RELEASE Bilateral   . VEIN HARVEST Right 10/23/2015   Procedure: [place];  Surgeon: [name], MD;  Location: [place];  Service: Vascular;  Laterality: Right;    Medications:  Home meds:  No current facility-administered medications on file prior to encounter.    Current Outpatient Medications on File Prior to Encounter  Medication Sig Dispense Refill  . apixaban (ELIQUIS) 5 MG TABS tablet Take 1 tablet (5 mg total) by mouth 2 (two) times daily. 56 tablet 0  . Carboxymethylcellulose Sodium (EYE DROPS OP) Apply 1 drop to eye daily as needed (dry eyes).     . carvedilol (COREG) 6.25 MG tablet Take 1 tablet (6.25 mg total) by mouth 2 (two) times daily with a meal. 60 tablet 0  . ezetimibe (ZETIA) 10 MG tablet Take 1 tablet (10 mg total) by mouth daily. 90 tablet 2  . fluticasone (FLONASE) 50 MCG/ACT nasal spray Place 2 sprays into both nostrils daily. 16 g 2  . guaiFENesin (MUCINEX) 600 MG 12 hr tablet Take 2 tablets (1,200 mg total) by mouth 2 (two) times daily. 30 tablet 0  . ipratropium (ATROVENT) 0.02 % nebulizer solution  Take 2.5 mLs (0.5 mg total) by nebulization every 6 (six) hours as needed for wheezing or shortness of breath. 75 mL 12  . levalbuterol (XOPENEX) 0.63 MG/3ML nebulizer solution Take 3 mLs (0.63 mg total) by nebulization every 4 (four) hours as needed for wheezing or shortness of breath. 3 mL 12  . nicotine (NICODERM CQ - DOSED IN MG/24 HOURS) 14 mg/24hr patch Place 1 patch (14 mg total) onto the skin daily. 28 patch 0  . potassium chloride (K-DUR,KLOR-CON) 10 MEQ tablet Take 1 tablet (10 mEq total) by  mouth daily. 30 tablet 11  . predniSONE (DELTASONE) 20 MG tablet Prednisone 60 mg daily for 3 days followed by  Prednisone 40 mg daily for 5 days. 30 tablet 0  . sacubitril-valsartan (ENTRESTO) 97-103 MG Take 1 tablet by mouth 2 (two) times daily. 180 tablet 3     Scheduled Meds: . apixaban  5 mg Oral BID  . budesonide (PULMICORT) nebulizer solution  0.25 mg Nebulization BID  . carvedilol  6.25 mg Oral BID WC  . ezetimibe  10 mg Oral Daily  . fluticasone  2 spray Each Nare Daily  . furosemide  40 mg Oral BID  . guaiFENesin  1,200 mg Oral BID  . hydrALAZINE  25 mg Oral Q8H  . insulin aspart  0-5 Units Subcutaneous QHS  . insulin aspart  0-9 Units Subcutaneous TID WC  . insulin aspart  3 Units Subcutaneous TID WC  . ipratropium-albuterol  3 mL Nebulization Q6H WA  . nicotine  14 mg Transdermal Daily  . oseltamivir  30 mg Oral Daily  . potassium chloride  20 mEq Oral BID  . predniSONE  40 mg Oral Q breakfast  . sacubitril-valsartan  1 tablet Oral BID  . sodium chloride flush  3 mL Intravenous Q12H  . sodium chloride flush  3 mL Intravenous Q12H   Continuous Infusions: . sodium chloride 250 mL (01/10/19 2201)   PRN Meds:.sodium chloride, acetaminophen **OR** acetaminophen, albuterol, hydrALAZINE, HYDROcodone-acetaminophen, ondansetron **OR** ondansetron (ZOFRAN) IV, senna-docusate, sodium chloride, sodium chloride flush  Allergies: No Known Allergies  Family History  Problem Relation Age of Onset  . Heart attack Father   . Hyperlipidemia Father   . Diabetes Mother   . Diabetes Brother   . Colon cancer Neg Hx     Social History:  reports that he has been smoking cigars. He started smoking about 69 years ago. He has a 60.00 pack-year smoking history. He has never used smokeless tobacco. He reports that he does not drink alcohol or use drugs.  ROS: A complete review of systems was performed.  All systems are negative except for pertinent findings as noted.  Physical Exam:   Vital signs in last 24 hours: Temp:  [97.9 F (36.6 C)-98.1 F (36.7 C)] 98.1 F (36.7 C) (02/23 0518) Pulse Rate:  [74-78] 78 (02/23 0518) Resp:  [18-20] 18 (02/23 0518) BP: (125-153)/(63-72) 153/72 (02/23 0518) SpO2:  [93 %-99 %] 93 % (02/23 0756) Constitutional:  Alert and oriented, No acute distress GI: Abdomen is soft, nontender, nondistended, no abdominal masses Genitourinary: No CVAT. Testes are descended bilaterally and non-tender and without masses, scrotum is normal in appearance without lesions or masses, perineum is normal on inspection. His penis is uncircumcised.  He has a narrow urethral meatus. Lymphatic: No lymphadenopathy Neurologic: Grossly intact, no focal deficits Psychiatric: Normal mood and affect  Laboratory Data:  Recent Labs    01/15/19 0547  WBC 12.1*  HGB 13.1  HCT 45.2  PLT 126*    Recent Labs    01/15/19 0547 01/16/19 0607 01/16/19 1238 01/17/19 0605  NA 142 144 144 144  K 3.6 3.3* 3.3* 3.8  CL 98 98 98 101  GLUCOSE 177* 149* 269* 135*  BUN 39* 41* 40* 38*  CALCIUM 9.4 9.3 8.8* 9.1  CREATININE 1.06 1.31* 1.39* 1.23     Results for orders placed or performed during the hospital encounter of 01/09/19 (from the past 24 hour(s))  Glucose, capillary     Status: Abnormal   Collection Time: 01/16/19 12:06 PM  Result Value Ref Range   Glucose-Capillary 245 (H) 70 - 99 mg/dL   Comment 1 Notify RN    Comment 2 Document in Chart   Renal function panel     Status: Abnormal   Collection Time: 01/16/19 12:38 PM  Result Value Ref Range   Sodium 144 135 - 145 mmol/L   Potassium 3.3 (L) 3.5 - 5.1 mmol/L   Chloride 98 98 - 111 mmol/L   CO2 36 (H) 22 - 32 mmol/L   Glucose, Bld 269 (H) 70 - 99 mg/dL   BUN 40 (H) 8 - 23 mg/dL   Creatinine, Ser 1.39 (H) 0.61 - 1.24 mg/dL   Calcium 8.8 (L) 8.9 - 10.3 mg/dL   Phosphorus 2.0 (L) 2.5 - 4.6 mg/dL   Albumin 3.2 (L) 3.5 - 5.0 g/dL   GFR calc non Af Amer 46 (L) >60 mL/min   GFR calc Af Amer 54 (L) >60  mL/min   Anion gap 10 5 - 15  Glucose, capillary     Status: Abnormal   Collection Time: 01/16/19  4:51 PM  Result Value Ref Range   Glucose-Capillary 179 (H) 70 - 99 mg/dL  Glucose, capillary     Status: Abnormal   Collection Time: 01/16/19  9:13 PM  Result Value Ref Range   Glucose-Capillary 56 (L) 70 - 99 mg/dL   Comment 1 Notify RN    Comment 2 Document in Chart    Comment 3 Repeat Test   Glucose, capillary     Status: None   Collection Time: 01/16/19  9:35 PM  Result Value Ref Range   Glucose-Capillary 76 70 - 99 mg/dL   Comment 1 Notify RN    Comment 2 Document in Chart   Basic metabolic panel     Status: Abnormal   Collection Time: 01/17/19  6:05 AM  Result Value Ref Range   Sodium 144 135 - 145 mmol/L   Potassium 3.8 3.5 - 5.1 mmol/L   Chloride 101 98 - 111 mmol/L   CO2 35 (H) 22 - 32 mmol/L   Glucose, Bld 135 (H) 70 - 99 mg/dL   BUN 38 (H) 8 - 23 mg/dL   Creatinine, Ser 1.23 0.61 - 1.24 mg/dL   Calcium 9.1 8.9 - 10.3 mg/dL   GFR calc non Af Amer 54 (L) >60 mL/min   GFR calc Af Amer >60 >60 mL/min   Anion gap 8 5 - 15  Magnesium     Status: None   Collection Time: 01/17/19  6:05 AM  Result Value Ref Range   Magnesium 2.1 1.7 - 2.4 mg/dL   Recent Results (from the past 240 hour(s))  Blood culture (routine x 2)     Status: None   Collection Time: 01/10/19  1:09 AM  Result Value Ref Range Status   Specimen Description BLOOD RIGHT FOREARM  Final   Special Requests   Final  BOTTLES DRAWN AEROBIC AND ANAEROBIC Blood Culture adequate volume   Culture   Final    NO GROWTH 5 DAYS Performed at [facility], [address] [place], [place] [address]    Report Status 01/15/2019 FINAL  Final  Blood culture (routine x 2)     Status: None   Collection Time: 01/10/19  1:36 AM  Result Value Ref Range Status   Specimen Description LEFT ANTECUBITAL  Final   Special Requests   Final    BOTTLES DRAWN AEROBIC AND ANAEROBIC Blood Culture adequate volume   Culture   Final     NO GROWTH 5 DAYS Performed at [facility], [address], [place], [place] [address]    Report Status 01/15/2019 FINAL  Final    Renal Function: Recent Labs    01/12/19 0549 01/13/19 0524 01/14/19 0607 01/15/19 0547 01/16/19 0607 01/16/19 1238 01/17/19 0605  CREATININE 1.24 1.06 1.08 1.06 1.31* 1.39* 1.23   Estimated Creatinine Clearance: 51.9 mL/min (by C-G formula based on SCr of 1.23 mg/dL).  Procedure: Under sterile conditions, I attempted placement of a 14 Fr catheter but this would not bypass the meatus.  I gently opened the meatus with hemostats and was then able to insert a 14 Fr catheter without resistance with return of grossly clear urine.  Impression/Recommendation Difficult urethral catheterization due to meatal stenosis:  Catheter successfully placed after dilation of meatal stenosis.  Ok to remove catheter once no longer medically necessary and give voiding trial.  If further questions, please call.  Dutch Gray 01/17/2019, 11:26 AM    Pryor Curia MD  CC: Dr. Wynetta Emery

## 2019-01-18 DIAGNOSIS — R338 Other retention of urine: Secondary | ICD-10-CM | POA: Diagnosis not present

## 2019-01-18 DIAGNOSIS — I4819 Other persistent atrial fibrillation: Secondary | ICD-10-CM

## 2019-01-18 DIAGNOSIS — Z20828 Contact with and (suspected) exposure to other viral communicable diseases: Secondary | ICD-10-CM | POA: Diagnosis not present

## 2019-01-18 DIAGNOSIS — I10 Essential (primary) hypertension: Secondary | ICD-10-CM

## 2019-01-18 LAB — GLUCOSE, CAPILLARY
Glucose-Capillary: 147 mg/dL — ABNORMAL HIGH (ref 70–99)
Glucose-Capillary: 176 mg/dL — ABNORMAL HIGH (ref 70–99)
Glucose-Capillary: 234 mg/dL — ABNORMAL HIGH (ref 70–99)
Glucose-Capillary: 232 mg/dL — ABNORMAL HIGH (ref 70–99)
Glucose-Capillary: 241 mg/dL — ABNORMAL HIGH (ref 70–99)

## 2019-01-18 LAB — BASIC METABOLIC PANEL
Anion gap: 8 (ref 5–15)
BUN: 32 mg/dL — ABNORMAL HIGH (ref 8–23)
CO2: 34 mmol/L — ABNORMAL HIGH (ref 22–32)
Calcium: 9.1 mg/dL (ref 8.9–10.3)
Chloride: 102 mmol/L (ref 98–111)
Creatinine, Ser: 1.07 mg/dL (ref 0.61–1.24)
GFR calc Af Amer: >60 mL/min (ref >60)
GFR calc non Af Amer: >60 mL/min (ref >60)
Glucose, Bld: 195 mg/dL — ABNORMAL HIGH (ref 70–99)
Potassium: 4.1 mmol/L (ref 3.5–5.1)
Sodium: 144 mmol/L (ref 135–145)

## 2019-01-18 LAB — CBC
HCT: 39.9 % (ref 39.0–52.0)
Hemoglobin: 11.6 g/dL — ABNORMAL LOW (ref 13.0–17.0)
MCH: 28.9 pg (ref 26.0–34.0)
MCHC: 29.1 g/dL — ABNORMAL LOW (ref 30.0–36.0)
MCV: 99.3 fL (ref 80.0–100.0)
Platelets: 105 10*3/uL — ABNORMAL LOW (ref 150–400)
RBC: 4.02 MIL/uL — ABNORMAL LOW (ref 4.22–5.81)
RDW: 16.4 % — ABNORMAL HIGH (ref 11.5–15.5)
WBC: 10.3 10*3/uL (ref 4.0–10.5)
nRBC: 0 % (ref 0.0–0.2)

## 2019-01-18 LAB — MAGNESIUM: Magnesium: 2 mg/dL (ref 1.7–2.4)

## 2019-01-18 MED ORDER — HYDRALAZINE HCL 25 MG PO TABS: 25.0000 mg | ORAL_TABLET | Freq: Three times a day (TID) | ORAL | 0 refills | Status: DC

## 2019-01-18 MED ORDER — FUROSEMIDE 40 MG PO TABS: 60.0000 mg | ORAL_TABLET | Freq: Every day | ORAL | Status: DC

## 2019-01-18 MED ORDER — POTASSIUM CHLORIDE CRYS ER 20 MEQ PO TBCR: 20.0000 meq | EXTENDED_RELEASE_TABLET | Freq: Every day | ORAL | 0 refills | Status: DC

## 2019-01-18 MED ORDER — SACUBITRIL-VALSARTAN 49-51 MG PO TABS: 1.0000 | ORAL_TABLET | Freq: Two times a day (BID) | ORAL | 0 refills | Status: AC

## 2019-01-18 MED ORDER — METFORMIN HCL ER 500 MG PO TB24: 500.0000 mg | ORAL_TABLET | Freq: Every day | ORAL | 0 refills | Status: DC

## 2019-01-18 MED ORDER — LEVALBUTEROL HCL 0.63 MG/3ML IN NEBU: 0.6300 mg | INHALATION_SOLUTION | Freq: Four times a day (QID) | RESPIRATORY_TRACT | 0 refills | Status: DC

## 2019-01-18 MED ORDER — OSELTAMIVIR PHOSPHATE 30 MG PO CAPS: 30.0000 mg | ORAL_CAPSULE | Freq: Every day | ORAL | 0 refills | Status: AC

## 2019-01-18 MED ORDER — FUROSEMIDE 20 MG PO TABS: 60.0000 mg | ORAL_TABLET | Freq: Every day | ORAL | 1 refills | Status: DC

## 2019-01-18 MED ORDER — IPRATROPIUM BROMIDE 0.02 % IN SOLN: 0.5000 mg | Freq: Four times a day (QID) | RESPIRATORY_TRACT | 0 refills | Status: DC

## 2019-01-18 MED ORDER — PREDNISONE 10 MG PO TABS: ORAL_TABLET | ORAL | 0 refills | Status: DC

## 2019-01-18 MED ORDER — POTASSIUM CHLORIDE CRYS ER 20 MEQ PO TBCR: 20.0000 meq | EXTENDED_RELEASE_TABLET | Freq: Every day | ORAL | Status: DC

## 2019-01-18 NOTE — Discharge Instructions (Signed)
Please make an appointment with [facility] in [place] in 1 week to have foley catheter removed in the office and make sure patient will be able to urinate after it is removed.    Please follow up with cardiologist on March 12 as already scheduled.   Please monitor blood sugars while on prednisone.  Take metformin daily with breakfast to help keep blood sugars under control while on prednisone.  Call Primary care provider for any blood sugar greater than 300.   You can stop the metformin when finished with steroids or for any adverse side effects like stomach upset, nausea or diarrhea.     IMPORTANT INFORMATION: PAY CLOSE ATTENTION   PHYSICIAN DISCHARGE INSTRUCTIONS  Follow with Primary care provider  Glenda Chroman, MD  and other consultants as instructed your Hospitalist Physician  SEEK MEDICAL CARE OR RETURN TO EMERGENCY ROOM IF SYMPTOMS COME BACK, WORSEN OR NEW PROBLEM DEVELOPS.   Please note: You were cared for by a hospitalist during your hospital stay. Every effort will be made to forward records to your primary care provider.  You can request that your primary care provider send for your hospital records if they have not received them.  Once you are discharged, your primary care physician will handle any further medical issues. Please note that NO REFILLS for any discharge medications will be authorized once you are discharged, as it is imperative that you return to your primary care physician (or establish a relationship with a primary care physician if you do not have one) for your post hospital discharge needs so that they can reassess your need for medications and monitor your lab values.  Please get a complete blood count and chemistry panel checked by your Primary MD at your next visit, and again as instructed by your Primary MD.  Get Medicines reviewed and adjusted: Please take all your medications with you for your next visit with your Primary MD  Laboratory/radiological  data: Please request your Primary MD to go over all hospital tests and procedure/radiological results at the follow up, please ask your primary care provider to get all Hospital records sent to his/her office.  In some cases, they will be blood work, cultures and biopsy results pending at the time of your discharge. Please request that your primary care provider follow up on these results.  If you are diabetic, please bring your blood sugar readings with you to your follow up appointment with primary care.    Please call and make your follow up appointments as soon as possible.    Also Note the following: If you experience worsening of your admission symptoms, develop shortness of breath, life threatening emergency, suicidal or homicidal thoughts you must seek medical attention immediately by calling 911 or calling your MD immediately  if symptoms less severe.  You must read complete instructions/literature along with all the possible adverse reactions/side effects for all the Medicines you take and that have been prescribed to you. Take any new Medicines after you have completely understood and accpet all the possible adverse reactions/side effects.   Do not drive when taking Pain medications or sleeping medications (Benzodiazepines)  Do not take more than prescribed Pain, Sleep and Anxiety Medications. It is not advisable to combine anxiety,sleep and pain medications without talking with your primary care practitioner  Special Instructions: If you have smoked or chewed Tobacco  in the last 2 yrs please stop smoking, stop any regular Alcohol  and or any Recreational drug use.  Wear Seat belts while driving.

## 2019-01-18 NOTE — Care Management Important Message (Signed)
Important Message  Patient Details  Name: Gary Odonnell MRN: [id number] Date of Birth: [birth year]   Medicare Important Message Given:  Yes    Sherald Barge, RN 01/18/2019, 1:24 PM

## 2019-01-18 NOTE — Progress Notes (Signed)
Physical Therapy Treatment Patient Details Name: Gary Odonnell MRN: [id number] DOB: 09-02-1932 Today's Date: 01/18/2019    History of Present Illness Gary Odonnell is a 85 y.o. male with medical history significant for coronary artery disease, type 2 diabetes mellitus, hypertension, atrial fibrillation on Eliquis, COPD, and chronic combined systolic and diastolic CHF, now presenting to the emergency department for evaluation of increasing shortness of breath, cough, and wheezing.  Patient was just discharged from the hospital after treatment for pneumonia with Rocephin and azithromycin.  Patient developed a productive cough and increased shortness of breath approximately 2 weeks ago, was started on Augmentin in the outpatient setting, continued to worsen and developed significant wheezing, and was admitted to the hospital on 01/05/2019, started on Rocephin and azithromycin, and was discharged with oxygen on 01/08/2019 in improved condition.  Unfortunately, over the past 24 hours or so, his shortness of breath has worsened again and he has begun to wheeze again as well.  He denies any lower extremity edema, denies any chest pain, and has not noted any fevers or chills.    PT Comments    Patient presents with indwelling foley catheter and patient states it will be temporary if can void after taken out.  Patient had difficulty sitting up with bed flat and not using bed rail requiring verbal/tactile for properly propping up on elbows during supine to sitting, demonstrates increased endurance/distance for ambulation in hallway while on 2 LPM O2 without loss of balance and tolerated sitting up in chair with family present in room after therapy.  Patient will benefit from continued physical therapy in hospital and recommended venue below to increase strength, balance, endurance for safe ADLs and gait.   Follow Up Recommendations  Home health PT;Supervision for mobility/OOB;Supervision - Intermittent      Equipment Recommendations  None recommended by PT    Recommendations for Other Services       Precautions / Restrictions Precautions Precautions: Fall Restrictions Weight Bearing Restrictions: No    Mobility  Bed Mobility   Bed Mobility: Supine to Sit     Supine to sit: Min assist;Min guard     General bed mobility comments: required verbal and tactile cueing for propping up on elbows during supine to sitting without use of bedrail with bed flat  Transfers Overall transfer level: Needs assistance Equipment used: Straight cane Transfers: Sit to/from Stand;Stand Pivot Transfers Sit to Stand: Supervision Stand pivot transfers: Supervision       General transfer comment: slightly labored movement without loss of balance  Ambulation/Gait Ambulation/Gait assistance: Supervision Gait Distance (Feet): 60 Feet Assistive device: Straight cane Gait Pattern/deviations: Step-through pattern;Decreased step length - left;Decreased step length - right;Decreased stride length Gait velocity: decreased   General Gait Details: increased endurance/distance with slightly labored cadence, no loss of balance   Stairs             Wheelchair Mobility    Modified Rankin (Stroke Patients Only)       Balance Overall balance assessment: Needs assistance Sitting-balance support: Feet supported;No upper extremity supported Sitting balance-Leahy Scale: Good Sitting balance - Comments: seated EOB   Standing balance support: Single extremity supported;During functional activity Standing balance-Leahy Scale: Fair Standing balance comment: with SPC                            Cognition Arousal/Alertness: Awake/alert Behavior During Therapy: WFL for tasks assessed/performed Overall Cognitive Status: Within Functional Limits for tasks assessed  Exercises General Exercises - Lower Extremity Long Arc Quad:  Seated;Strengthening;AROM;Both;10 reps Hip Flexion/Marching: AROM;Strengthening;Seated;Both;10 reps Toe Raises: 10 reps;Both;Strengthening;Seated;AROM Heel Raises: Seated;Strengthening;AROM;Both;10 reps    General Comments        Pertinent Vitals/Pain Pain Assessment: No/denies pain    Home Living                      Prior Function            PT Goals (current goals can now be found in the care plan section) Acute Rehab PT Goals Patient Stated Goal: return home with family to assist PT Goal Formulation: With patient Time For Goal Achievement: 01/23/19 Potential to Achieve Goals: Good Progress towards PT goals: Progressing toward goals    Frequency    Min 3X/week      PT Plan Current plan remains appropriate    Co-evaluation              AM-PAC PT "6 Clicks" Mobility   Outcome Measure  Help needed turning from your back to your side while in a flat bed without using bedrails?: None Help needed moving from lying on your back to sitting on the side of a flat bed without using bedrails?: A Little Help needed moving to and from a bed to a chair (including a wheelchair)?: None Help needed standing up from a chair using your arms (e.g., wheelchair or bedside chair)?: None Help needed to walk in hospital room?: A Little Help needed climbing 3-5 steps with a railing? : A Little 6 Click Score: 21    End of Session Equipment Utilized During Treatment: Gait belt Activity Tolerance: Patient tolerated treatment well Patient left: in chair;with call bell/phone within reach;with family/visitor present Nurse Communication: Mobility status PT Visit Diagnosis: Unsteadiness on feet (R26.81);Muscle weakness (generalized) (M62.81);Other abnormalities of gait and mobility (R26.89)     Time: 0937-1000 PT Time Calculation (min) (ACUTE ONLY): 23 min  Charges:  $Gait Training: 8-22 mins $Therapeutic Exercise: 8-22 mins                     1:59 PM, 01/18/19 Lonell Grandchild, MPT Physical Therapist with [facility] 336 [phone] office [phone] mobile phone

## 2019-01-18 NOTE — Discharge Summary (Addendum)
Physician Discharge Summary  PORTER MOES VZD:[id number] DOB: 03/15/1932 DOA: 01/09/2019  PCP: Glenda Chroman, MD Cardiologist: Dr. Harl Bowie   Admit date: 01/09/2019 Discharge date: 01/18/2019  Admitted From: Home  Disposition: Home with [place]  Recommendations for Outpatient Follow-up:  1. Follow up with [facility] in 1 weeks for foley removal and voiding trial.  2. Follow up with Dr. Harl Bowie on 3/12 as scheduled.  3. Absolute smoking cessation  Home Health: PT, RN, hospital bed   Discharge Condition: STABLE   CODE STATUS: DNR    Brief Hospitalization Summary: Please see all hospital notes, images, labs for full details of the hospitalization. Dr. Criss Rosales HPI: Gary Odonnell is a 85 y.o. male with medical history significant for coronary artery disease, type 2 diabetes mellitus, hypertension, atrial fibrillation on Eliquis, COPD, and chronic combined systolic and diastolic CHF, now presenting to the emergency department for evaluation of increasing shortness of breath, cough, and wheezing.  Patient was just discharged from the hospital after treatment for pneumonia with Rocephin and azithromycin.  Patient developed a productive cough and increased shortness of breath approximately 2 weeks ago, was started on Augmentin in the outpatient setting, continued to worsen and developed significant wheezing, and was admitted to the hospital on 01/05/2019, started on Rocephin and azithromycin, and was discharged with oxygen on 01/08/2019 in improved condition.  Unfortunately, over the past 24 hours or so, his shortness of breath has worsened again and he has begun to wheeze again as well.  He denies any lower extremity edema, denies any chest pain, and has not noted any fevers or chills.  ED Course: Upon arrival to the ED, patient is found to be afebrile, saturating mid 90s on room air, mildly tachypneic, and with vitals otherwise stable.  EKG features atrial fibrillation with  RBBB, LAFB, and LVH with repolarization abnormality.  Chest x-ray is notable for persistent bibasilar opacification with mild worsening in the right base that likely reflects worsening infection.  Chemistry panel is notable for a glucose of 214, total bilirubin 1.5, and creatinine 1.63, up from 1.21 two days earlier.  CBC is notable for mild thrombocytopenia and macrocytosis without anemia.  Troponin is elevated to 0.12 and BNP is elevated to 2650.  Blood cultures were collected and the patient was treated with IV Solu-Medrol, albuterol, Atrovent, 500 cc normal saline bolus, vancomycin, and cefepime.  He remains hemodynamically stable and will be observed for further evaluation and management.  Brief Narrative:  85 year old male with a history of chronic combined CHF with ejection fraction 35%, COPD, atrial fibrillation on anticoagulation, hypertension and diabetes, presented to the hospital with complaints of shortness of breath, cough and wheezing.  He was recently discharged from the hospital after being treated for COPD exacerbation with pneumonia.  Initially felt better on discharge, but shortly after returning home, the following day, he began to feel more short of breath and needed to come back to the hospital.  Work-up in the emergency room indicated an elevated BNP.  CT scan of the chest showed bilateral pleural effusions.  He was admitted to the hospital for further management of CHF exacerbation.  Assessment & Plan:   Principal Problem:   Pneumonia Active Problems:   HTN (hypertension)   Chronic combined systolic and diastolic CHF (congestive heart failure) (HCC)   CAD (coronary atherosclerotic disease)   COPD with acute exacerbation (HCC)   A-fib (HCC)   Acute renal failure superimposed on stage 3 chronic kidney disease ([place])  Diet-controlled diabetes mellitus ([place])   CHF (congestive heart failure) (HCC)   Acute on chronic systolic heart failure ([place])  1. Acute on chronic combined  CHF.  Echocardiogram repeated and shows that EF is unchanged from 01/2018 at 35 to 40%.  He has evidence of bilateral pleural effusions.  Cardiology following and he was diuresed on IV Lasix until creatinine started bumping.  Continue entresto 49-51. Titrate outpatient with [facility] team.   Cardiology saw him prior to discharge today and recommended lasix 60 mg daily.  Continue current dose of entresto, coreg, eliquis, hydralazine and zetia.  2. Persistent Atrial fibrillation.  Currently well controlled.  Continue Coreg and Eliquis. 3. Coronary artery disease.  Mildly elevated troponin likely related strain.  No evidence of ACS.  No chest pain. 4. Acute kidney injury on chronic kidney disease stage III.  Creatinine 1.6 on admission.   Continue to monitor in the setting of diuresis.  Baseline creatinine 1.4-1.6.  His discharge creatinine is 1.07 and GFR 60.1 mL/min.   5. Acute Urinary retention - urology consulted for difficult to place foley due to meatal stenosis which was treated by urologist.  Foley catheter was placed and removed on 2/24 however patient failed voiding trial and foley was reinserted.  Pt will discharge home with foley in place and have outpatient follow up with [facility].  I spoke with patient's daughter and she feels comfortable managing at home and nurse will educate family on home foley care prior to discharge.  I would like patient to follow up with urology in 1 week for voiding trial and foley removal with [facility].  6. COPD Exacerbation. Initially treated with Solu-Medrol, nebulizer treatments and pulmonary hygiene.  Now he is on oral steroid taper and his respiratory status has improved.  He feels back to his baseline.  He will discharge with continuous supplemental oxygen.       7. Exposure to Influenza A - Staff member tested positive for influenza A.  Spoke with ID physician Dr. Megan Salon and recommended that tamiflu prophylaxis be given.  Pt was started  on tamiflu prophylaxis to finish remainder of course at home.  8. Tobacco abuse - Pt was counseled at length about stopping all cigarette use, especially if he is wearing oxygen, he and wife verbalized understanding.  Family to assist with smoking cessation.   9. Type 2 Diabetes.  Pt having some steroid induced hyperglycemia but as steroids are being tapered down blood sugars should continue to improve.  Instructions given to monitor BS and if any BS>300 to notify primary care provider. He has not had any blood sugar over 250 in last couple of days in hospital.  With his GFR>60 will give temporary metformin for 14 days to take for blood sugar management while on steroids to prevent sugars from being too high at home.  10. Mechanical fall.  Patient slipped while he was in the bathroom.  X-ray of ribs was unrevealing.  He also complained of back pain and T-L-spine x-rays were obtained.  This confirmed remote T12 compression fracture.  Patient reports he has had this fracture for approximately 20 years.  Spoke with daughter who was concerned about going home.  PT came and reassessed him today and recommending HHPT.    DVT prophylaxis: Apixaban Code Status: DNR Family Communication: Discussed with wife and son at the bedside, daughter by telephone Disposition Plan: home with home health services  Consultants:   Cardiology  Procedures:  Echo: 1. The left ventricle  has moderate-severely reduced systolic function, with an ejection fraction of 30-35%. The cavity size was mildly dilated. There is moderately increased left ventricular wall thickness. Left ventricular diastolic Doppler parameters are indeterminate. 2. The right ventricle has normal systolic function. The cavity was normal. There is no increase in right ventricular wall thickness. 3. Left atrial size was moderately dilated. 4. Trivial pericardial effusion is present. 5. The mitral valve is degenerative. Moderate thickening of the  mitral valve leaflet. Moderate calcification of the mitral valve leaflet. There is moderate mitral annular calcification present. 6. The tricuspid valve is normal in structure. 7. The aortic valve is tricuspid Moderate thickening of the aortic valve Moderate calcification of the aortic valve. mild stenosis of the aortic valve. 8. The pulmonic valve was grossly normal. Pulmonic valve regurgitation is mild by color flow Doppler.  9. Right atrial pressure is estimated at 8 mmHg.  Antimicrobials:   Vancomycin 2/16 > 2/17  Cefepime 2/16 > 2/17  Discharge Diagnoses:  Principal Problem:   Pneumonia Active Problems:   HTN (hypertension)   Chronic combined systolic and diastolic CHF (congestive heart failure) (HCC)   CAD (coronary atherosclerotic disease)   COPD with acute exacerbation (HCC)   A-fib (HCC)   Acute renal failure superimposed on stage 3 chronic kidney disease ([place])   Diet-controlled diabetes mellitus (HCC)   CHF (congestive heart failure) (HCC)   Acute on chronic systolic heart failure ([place])   Exposure to influenza   Acute urinary retention  Discharge Instructions: Discharge Instructions    (HEART FAILURE PATIENTS) Call MD:  Anytime you have any of the following symptoms: 1) 3 pound weight gain in 24 hours or 5 pounds in 1 week 2) shortness of breath, with or without a dry hacking cough 3) swelling in the hands, feet or stomach 4) if you have to sleep on extra pillows at night in order to breathe.   Complete by:  As directed    Call MD for:  difficulty breathing, headache or visual disturbances   Complete by:  As directed    Call MD for:  extreme fatigue   Complete by:  As directed    Call MD for:  persistant dizziness or light-headedness   Complete by:  As directed    Call MD for:  persistant nausea and vomiting   Complete by:  As directed    Diet - low sodium heart healthy   Complete by:  As directed    Increase activity slowly   Complete by:  As directed       Allergies as of 01/18/2019   No Known Allergies     Medication List    STOP taking these medications   azithromycin 500 MG tablet Commonly known as:  ZITHROMAX   sacubitril-valsartan 97-103 MG Commonly known as:  ENTRESTO Replaced by:  sacubitril-valsartan 49-51 MG     TAKE these medications   apixaban 5 MG Tabs tablet Commonly known as:  ELIQUIS Take 1 tablet (5 mg total) by mouth 2 (two) times daily.   carvedilol 6.25 MG tablet Commonly known as:  COREG Take 1 tablet (6.25 mg total) by mouth 2 (two) times daily with a meal.   EYE DROPS OP Apply 1 drop to eye daily as needed (dry eyes).   ezetimibe 10 MG tablet Commonly known as:  ZETIA Take 1 tablet (10 mg total) by mouth daily.   fluticasone 50 MCG/ACT nasal spray Commonly known as:  FLONASE Place 2 sprays into both nostrils daily.  furosemide 20 MG tablet Commonly known as:  LASIX Take 3 tablets (60 mg total) by mouth daily for 30 days. Start taking on:  January 19, 2019   guaiFENesin 600 MG 12 hr tablet Commonly known as:  MUCINEX Take 2 tablets (1,200 mg total) by mouth 2 (two) times daily.   hydrALAZINE 25 MG tablet Commonly known as:  APRESOLINE Take 1 tablet (25 mg total) by mouth every 8 (eight) hours for 30 days.   ipratropium 0.02 % nebulizer solution Commonly known as:  ATROVENT Take 2.5 mLs (0.5 mg total) by nebulization 4 (four) times daily for 30 days. What changed:    when to take this  reasons to take this   levalbuterol 0.63 MG/3ML nebulizer solution Commonly known as:  XOPENEX Take 3 mLs (0.63 mg total) by nebulization 4 (four) times daily for 30 days. What changed:    when to take this  reasons to take this   nicotine 14 mg/24hr patch Commonly known as:  NICODERM CQ - dosed in mg/24 hours Place 1 patch (14 mg total) onto the skin daily.   oseltamivir 30 MG capsule Commonly known as:  TAMIFLU Take 1 capsule (30 mg total) by mouth daily for 10 days. Start taking on:   January 19, 2019   potassium chloride SA 20 MEQ tablet Commonly known as:  K-DUR,KLOR-CON Take 1 tablet (20 mEq total) by mouth daily for 30 days. What changed:    medication strength  how much to take   predniSONE 10 MG tablet Commonly known as:  DELTASONE Take 3 tabs po daily with breakfast x 5 days, then 2 tabs x 5 days, 1 tab x 5 days, then stop Start taking on:  January 19, 2019 What changed:    medication strength  additional instructions   sacubitril-valsartan 49-51 MG Commonly known as:  ENTRESTO Take 1 tablet by mouth 2 (two) times daily for 30 days. Replaces:  sacubitril-valsartan 97-103 MG            Durable Medical Equipment  (From admission, onward)         Start     Ordered   01/18/19 0851  For home use only DME Hospital bed  Once    Question Answer Comment  Patient has (list medical condition): COPD, Cardiomyopathy, chronic CHF   Head must be elevated greater than: 45 degrees   Bed type Semi-electric      01/18/19 0851   01/15/19 1315  For home use only DME oxygen  Once    Comments:  Deliver portable oxygen concentrator  Question Answer Comment  Mode or (Route) Nasal cannula   Liters per Minute 3   Frequency Continuous (stationary and portable oxygen unit needed)   Oxygen conserving device Yes   Oxygen delivery system Gas      01/15/19 1316         Follow-up Information    [facility] Follow [facility].   Specialty:  Home Health Services Contact information: [place] [address] [phone]        Glenda Chroman, MD. Schedule an appointment as soon as possible for a visit in 1 week(s).   Specialty:  Internal Medicine Why:  Hospital follow Up  Contact information: [place] [address] [phone]        Arnoldo Lenis, MD. Go on 02/04/2019.   Specialty:  Cardiology Why:  Hospital Follow Up as scheduled Contact information: [place]  [place]  [address] [phone]        [facility]. Schedule an appointment as soon as possible for a visit in 1 week(s).   Why:  Urinary Retention - Remove Foley Catheter  Contact information: [address] [place] [address] [phone]         No Known Allergies Allergies as of 01/18/2019   No Known Allergies     Medication List    STOP taking these medications   azithromycin 500 MG tablet Commonly known as:  ZITHROMAX   sacubitril-valsartan 97-103 MG Commonly known as:  ENTRESTO Replaced by:  sacubitril-valsartan 49-51 MG     TAKE these medications   apixaban 5 MG Tabs tablet Commonly known as:  ELIQUIS Take 1 tablet (5 mg total) by mouth 2 (two) times daily.   carvedilol 6.25 MG tablet Commonly known as:  COREG Take 1 tablet (6.25 mg total) by mouth 2 (two) times daily with a meal.   EYE DROPS OP Apply 1 drop to eye daily as needed (dry eyes).   ezetimibe 10 MG tablet Commonly known as:  ZETIA Take 1 tablet (10 mg total) by mouth daily.   fluticasone 50 MCG/ACT nasal spray Commonly known as:  FLONASE Place 2 sprays into both nostrils daily.   furosemide 20 MG tablet Commonly known as:  LASIX Take 3 tablets (60 mg total) by mouth daily for 30 days. Start taking on:  January 19, 2019   guaiFENesin 600 MG 12 hr tablet Commonly known as:  MUCINEX Take 2 tablets (1,200 mg total) by mouth 2 (two) times daily.   hydrALAZINE 25 MG tablet Commonly known as:  APRESOLINE Take 1 tablet (25 mg total) by mouth every 8 (eight) hours for 30 days.   ipratropium 0.02 % nebulizer solution Commonly known as:  ATROVENT Take 2.5 mLs (0.5 mg total) by nebulization 4 (four) times daily for 30 days. What changed:    when to take this  reasons to take this   levalbuterol 0.63 MG/3ML nebulizer solution Commonly known as:  XOPENEX Take 3 mLs (0.63 mg total) by nebulization 4 (four) times daily for 30 days. What changed:    when to  take this  reasons to take this   nicotine 14 mg/24hr patch Commonly known as:  NICODERM CQ - dosed in mg/24 hours Place 1 patch (14 mg total) onto the skin daily.   oseltamivir 30 MG capsule Commonly known as:  TAMIFLU Take 1 capsule (30 mg total) by mouth daily for 10 days. Start taking on:  January 19, 2019   potassium chloride SA 20 MEQ tablet Commonly known as:  K-DUR,KLOR-CON Take 1 tablet (20 mEq total) by mouth daily for 30 days. What changed:    medication strength  how much to take   predniSONE 10 MG tablet Commonly known as:  DELTASONE Take 3 tabs po daily with breakfast x 5 days, then 2 tabs x 5 days, 1 tab x 5 days, then stop Start taking on:  January 19, 2019 What changed:    medication strength  additional instructions   sacubitril-valsartan 49-51 MG Commonly known as:  ENTRESTO Take 1 tablet by mouth 2 (two) times daily for 30 days. Replaces:  sacubitril-valsartan 97-103 MG            Durable Medical Equipment  (From admission, onward)         Start     Ordered   01/18/19 0851  For home use  only DME Hospital bed  Once    Question Answer Comment  Patient has (list medical condition): COPD, Cardiomyopathy, chronic CHF   Head must be elevated greater than: 45 degrees   Bed type Semi-electric      01/18/19 0851   01/15/19 1315  For home use only DME oxygen  Once    Comments:  Deliver portable oxygen concentrator  Question Answer Comment  Mode or (Route) Nasal cannula   Liters per Minute 3   Frequency Continuous (stationary and portable oxygen unit needed)   Oxygen conserving device Yes   Oxygen delivery system Gas      01/15/19 1316         Procedures/Studies: Dg Chest 2 View  Result Date: 01/05/2019 CLINICAL DATA:  85 year old with acute cough and shortness of breath. Current smoker. EXAM: CHEST - 2 VIEW COMPARISON:  12/27/2018 and earlier, including CT chest 07/04/2015. FINDINGS: Prior sternotomy. Cardiac silhouette moderately  enlarged, unchanged. Thoracic aorta atherosclerotic, unchanged. Hilar and mediastinal contours otherwise unremarkable. Airspace consolidation in the BILATERAL lower lobes, LEFT greater than RIGHT, increased since the chest x-ray 9 days ago. Lungs remain clear otherwise. Normal pulmonary vascularity. Stable BILATERAL pleural effusions. Remote T12 compression fracture. IMPRESSION: Worsening bilateral lower lobe pneumonia, LEFT greater than RIGHT. Stable BILATERAL pleural effusions. Electronically Signed   By: Evangeline Dakin M.D.   On: 01/05/2019 14:33   Dg Chest 2 View  Result Date: 12/27/2018 CLINICAL DATA:  Shortness of breath and cough for 1 week. History of lung cancer. EXAM: CHEST - 2 VIEW COMPARISON:  Single-view of the chest 12/27/2014. CT chest 07/04/2015. FINDINGS: There is left worse than right basilar airspace disease. Small left effusion is identified. Cardiomegaly and vascular congestion are seen. The patient is status post CABG. Aortic atherosclerosis is noted. Remote T12 compression fracture is unchanged. IMPRESSION: Small left pleural effusion and left worse than right basilar airspace disease which could be atelectasis or pneumonia. Cardiomegaly and pulmonary vascular congestion. Atherosclerosis. Electronically Signed   By: Inge Rise M.D.   On: 12/27/2018 13:29   Dg Ribs Bilateral W/chest  Result Date: 01/11/2019 CLINICAL DATA:  Left rib pain after fall. EXAM: BILATERAL RIBS AND CHEST - 4+ VIEW COMPARISON:  CT scan and radiograph of January 10, 2019. FINDINGS: Stable cardiomegaly. No pneumothorax is noted. No evidence of rib fracture is noted. Small bilateral pleural effusions are noted. Sternotomy wires are noted. IMPRESSION: No evidence of rib fracture. Small bilateral pleural effusions are noted. Stable cardiomegaly. Electronically Signed   By: Marijo Conception, M.D.   On: 01/11/2019 15:25   Dg Thoracic Spine W/swimmers  Result Date: 01/13/2019 CLINICAL DATA:  Back pain due to  fall EXAM: THORACIC SPINE - 3 VIEWS COMPARISON:  Chest CT from 2 days ago FINDINGS: No evidence of acute fracture. Remote T12 compression fracture with advanced height loss. Pleural fluid that is partially visualized, known from preceding chest CT. Generalized spondylosis and disc narrowing typical of age IMPRESSION: 1. Remote T12 compression fracture. 2. No acute finding. Electronically Signed   By: Monte Fantasia M.D.   On: 01/13/2019 10:48   Dg Lumbar Spine 2-3 Views  Result Date: 01/13/2019 CLINICAL DATA:  Back pain EXAM: LUMBAR SPINE - 2-3 VIEW COMPARISON:  03/12/2018 FINDINGS: Remote T12 compression fracture as described on dedicated study. No acute fracture is seen in the lumbar spine. Generalized disc narrowing. Lower lumbar degenerative facet spurring. IMPRESSION: 1. No acute finding. 2. Remote T12 compression fracture. Electronically Signed   By: Angelica Chessman  Watts M.D.   On: 01/13/2019 10:50   Ct Chest Wo Contrast  Result Date: 01/10/2019 CLINICAL DATA:  Pneumonia EXAM: CT CHEST WITHOUT CONTRAST TECHNIQUE: Multidetector CT imaging of the chest was performed following the standard protocol without IV contrast. COMPARISON:  Chest CT dated 07/04/2015. Chest x-ray dated 01/10/2019 FINDINGS: Cardiovascular: Cardiomegaly. No pericardial effusion. Diffuse coronary artery calcifications. Aortic atherosclerosis. No aortic aneurysm. Mediastinum/Nodes: No enlarged lymph nodes identified within the mediastinum or perihilar regions. Esophagus appears normal. Trachea and central bronchi are unremarkable. Lungs/Pleura: Bilateral pleural effusions, both moderate to large in size. mild associated compressive atelectasis within the lower lobes bilaterally. Mild perihilar edema. Emphysematous changes within the lung apices. Upper Abdomen: Small ascites within the upper abdomen, incompletely imaged. Limited images of the upper abdomen are otherwise unremarkable. Musculoskeletal: No acute or suspicious osseous finding.  Degenerative spondylosis of the mid and lower thoracic spine, mild to moderate in degree. Chronic appearing compression deformity of the T12 vertebral body. IMPRESSION: 1. Bilateral pleural effusions, both moderate to large in size, with associated compressive atelectasis within the lower lobes bilaterally. 2. No evidence of pneumonia. 3. Emphysematous changes within the lung apices. 4. Small ascites within the upper abdomen, incompletely imaged. 5. Cardiomegaly. Aortic Atherosclerosis (ICD10-I70.0) and Emphysema (ICD10-J43.9). Electronically Signed   By: Franki Cabot M.D.   On: 01/10/2019 18:54   Dg Chest Port 1 View  Result Date: 01/15/2019 CLINICAL DATA:  Leukocytosis. EXAM: PORTABLE CHEST 1 VIEW COMPARISON:  Chest CT 01/10/2019 FINDINGS: Cardiomediastinal silhouette is stably enlarged. Mediastinal contours appear intact. There is no significant change in the appearance of the chest with bilateral pleural effusions and lower lobe atelectasis versus airspace consolidation. Osseous structures are without acute abnormality. Soft tissues are grossly normal. IMPRESSION: No significant change in the appearance of the chest with bilateral pleural effusions and lower lobes atelectasis versus airspace consolidation. Electronically Signed   By: Fidela Salisbury M.D.   On: 01/15/2019 08:24   Dg Chest Portable 1 View  Result Date: 01/10/2019 CLINICAL DATA:  Shortness of breath and pneumonia. EXAM: PORTABLE CHEST 1 VIEW COMPARISON:  01/05/2019 FINDINGS: Lungs are adequately inflated with worsening opacification over the medial right base and persistent hazy opacification over the left base/retrocardiac region. Cardiomediastinal silhouette and remainder of the exam is unchanged. IMPRESSION: Persistent bibasilar opacification with mild worsening in the right base. Findings likely due to worsening infection. Possible associated left effusion. Electronically Signed   By: Marin Olp M.D.   On: 01/10/2019 00:29    Dg Swallowing Func-speech Pathology  Result Date: 01/06/2019 Objective Swallowing Evaluation: Type of Study: MBS-Modified Barium Swallow Study  Patient Details Name: JARTAVIOUS MCKIMMY MRN: [id number] Date of Birth: [birth year] Today's Date: 01/06/2019 Time: SLP Start Time (ACUTE ONLY): 1225 -SLP Stop Time (ACUTE ONLY): 1255 SLP Time Calculation (min) (ACUTE ONLY): 30 min Past Medical History: Past Medical History: Diagnosis Date . Cancer ([place])   L lung . Coronary artery disease  . Hypercholesteremia  . Hypertension  . PAD (peripheral artery disease) ([place])  . Pneumonia 09/2013 . Radiation april 2016 . Solitary pulmonary nodule on lung CT 12/13/13   11/15/14 . Stroke [facility]) 11/2008 Past Surgical History: Past Surgical History: Procedure Laterality Date . Angioplasty to Left femoral artery   . CARPAL TUNNEL RELEASE Left 05/11/2014  Procedure: LEFT CARPAL TUNNEL RELEASE;  Surgeon: Carole Civil, MD;  Location: [facility];  Service: Orthopedics;  Laterality: Left; . COLONOSCOPY N/A 01/24/2015  Procedure: COLONOSCOPY;  Surgeon: Danie Binder, MD;  [facility];  Service: Endoscopy;  Laterality: N/A;  130 . CORONARY ARTERY BYPASS GRAFT    x4 . FEMORAL-POPLITEAL BYPASS GRAFT Right 10/23/2015  Procedure: BYPASS GRAFT FEMORAL TO BELOW KNEE POPLITEAL ARTERY USING RIGHT NON-REVERSED GREATER SAPPHENOUS VEIN;  Surgeon: [name], MD;  Location: [place];  Service: Vascular;  Laterality: Right; . INTRAOPERATIVE ARTERIOGRAM Right 10/23/2015  Procedure: INTRA OPERATIVE ARTERIOGRAM - RIGHT LOWER LEG;  Surgeon: [name], MD;  Location: [place];  Service: Vascular;  Laterality: Right; . KNEE ARTHROSCOPY Right  . LEFT AND RIGHT HEART CATHETERIZATION WITH CORONARY ANGIOGRAM N/A 09/29/2013  Procedure: LEFT AND RIGHT HEART CATHETERIZATION WITH CORONARY ANGIOGRAM;  Surgeon: Burnell Blanks, MD;  Location: [facility] CATH LAB;  Service: Cardiovascular;  Laterality: N/A; . PERIPHERAL VASCULAR CATHETERIZATION N/A 04/21/2015   Procedure: Abdominal Aortogram w/Lower Extremity;  Surgeon: [name], MD;  Location: [place] CV LAB;  Service: Cardiovascular;  Laterality: N/A; . Stent to right femoral artery   . TRIGGER FINGER RELEASE Bilateral  . VEIN HARVEST Right 10/23/2015  Procedure: [place];  Surgeon: [name], MD;  Location: [place];  Service: Vascular;  Laterality: Right; HPI: 85 yo male adm [facility] with CAP.  Pt with PMH + for left frontal lobe infarct 2014 after having left hand and facial numbness, CVA, lung cancer s/p XRT.  Swallow evaluation ordered. Pt CXR showed worsening bilateral lobe pna (left more than right).  He is a smoker.   Subjective: pt awake in chair Assessment / Plan / Recommendation CHL IP CLINICAL IMPRESSIONS 01/06/2019 Clinical Impression Patient presents with overall functional oropharyngeal swallow ability.  His ability to masticate is mildly compromised given his lack of upper dentures.  Pharyngeal swallow is strong without significant residuals.  Laryngeal penetration of thin liquid observed that is [facility] given pt's age.  Pt even tested with sequential swallows of liquids with good airway protection.  Note pt conducting extended breath hold when swallowing liquids to help protect airway with residual clearance.  Of note, barium tablet momentarily lodged at pyriform sinus which pt immediately cleared with 2nd swallow of liquid.  He was NOT sensate to pill lodging.  Pt's symptoms were not demonstrated during MBS.   SLP Visit Diagnosis Dysphagia, unspecified (R13.10) Attention and concentration deficit following -- Frontal lobe and executive function deficit following -- Impact on safety and function Mild aspiration risk   CHL IP TREATMENT RECOMMENDATION 01/06/2019 Treatment Recommendations Defer until completion of intrumental exam   Prognosis 01/06/2019 Prognosis for Safe Diet Advancement Guarded Barriers to Reach Goals Severity of deficits;Time post onset  Barriers/Prognosis Comment -- CHL IP DIET RECOMMENDATION 01/06/2019 SLP Diet Recommendations Thin liquid;Regular solids Liquid Administration via Cup Medication Administration Whole meds with liquid Compensations Slow rate;Small sips/bites Postural Changes Remain semi-upright after after feeds/meals (Comment);Seated upright at 90 degrees   CHL IP OTHER RECOMMENDATIONS 01/06/2019 Recommended Consults -- Oral Care Recommendations Oral care BID Other Recommendations --   CHL IP FOLLOW UP RECOMMENDATIONS 01/06/2019 Follow up Recommendations None   No flowsheet data found.     CHL IP ORAL PHASE 01/06/2019 Oral Phase WFL Oral - Pudding Teaspoon -- Oral - Pudding Cup -- Oral - Honey Teaspoon -- Oral - Honey Cup -- Oral - Nectar Teaspoon -- Oral - Nectar Cup WFL Oral - Nectar Straw -- Oral - Thin Teaspoon -- Oral - Thin Cup WFL Oral - Thin Straw WFL Oral - Puree WFL Oral - Mech Soft WFL Oral - Regular -- Oral -  Multi-Consistency WFL Oral - Pill WFL Oral Phase - Comment --  CHL IP PHARYNGEAL PHASE 01/06/2019 Pharyngeal Phase Impaired Pharyngeal- Pudding Teaspoon -- Pharyngeal -- Pharyngeal- Pudding Cup -- Pharyngeal -- Pharyngeal- Honey Teaspoon -- Pharyngeal -- Pharyngeal- Honey Cup -- Pharyngeal -- Pharyngeal- Nectar Teaspoon -- Pharyngeal -- Pharyngeal- Nectar Cup WFL Pharyngeal -- Pharyngeal- Nectar Straw -- Pharyngeal -- Pharyngeal- Thin Teaspoon -- Pharyngeal -- Pharyngeal- Thin Cup WFL;Penetration/Aspiration during swallow Pharyngeal Material enters airway, remains ABOVE vocal cords and not ejected out Pharyngeal- Thin Straw WFL;Penetration/Aspiration during swallow Pharyngeal Material enters airway, remains ABOVE vocal cords and not ejected out Pharyngeal- Puree WFL Pharyngeal -- Pharyngeal- Mechanical Soft WFL Pharyngeal -- Pharyngeal- Regular -- Pharyngeal -- Pharyngeal- Multi-consistency WFL Pharyngeal -- Pharyngeal- Pill Pharyngeal residue - pyriform Pharyngeal -- Pharyngeal Comment pill lodged at pyriform sinus  momentarily without pt awareness- liquid swallow facililtated clearance; pt does an extended breath hold with double swallows when consuming liquids - suspect self created compensation strategy  CHL IP CERVICAL ESOPHAGEAL PHASE 01/06/2019 Cervical Esophageal Phase WFL Pudding Teaspoon -- Pudding Cup -- Honey Teaspoon -- Honey Cup -- Nectar Teaspoon -- Nectar Cup -- Nectar Straw -- Thin Teaspoon -- Thin Cup -- Thin Straw -- Puree -- Mechanical Soft -- Regular -- Multi-consistency -- Pill -- Cervical Esophageal Comment -- Macario Golds 01/06/2019, 12:13 PM  Luanna Salk, MS [facility] SLP Acute Rehab Services Pager [phone] Office [phone]               Subjective: The patient feels a lot better today.  He has been ambulating.  He denies chest pain and SOB.  No more stomach or bladder discomfort.   Discharge Exam: Vitals:   01/18/19 1303 01/18/19 1414  BP: (!) 130/52   Pulse: 78   Resp: 16   Temp:    SpO2: 99% 98%   Vitals:   01/18/19 0809 01/18/19 0812 01/18/19 1303 01/18/19 1414  BP:   (!) 130/52   Pulse:   78   Resp:   16   Temp:      TempSrc:      SpO2: 96% 100% 99% 98%  Weight:      Height:       General exam: Alert, awake, oriented x 3 Respiratory system: BBS with rare expiratory wheezes, improved from prior exams.  Respiratory effort normal. Cardiovascular system:normal s1,s2 sounds. No murmurs, rubs, gallops. Gastrointestinal system:  Abdomen is soft and nontender. No organomegaly or masses felt. Normal bowel sounds heard. Central nervous system: Alert and oriented. No focal neurological deficits. Extremities: no edema BLEs.   Skin: No rashes, lesions or ulcers Psychiatry: Judgement and insight appear normal. Mood & affect appropriate.    The results of significant diagnostics from this hospitalization (including imaging, microbiology, ancillary and laboratory) are listed below for reference.     Microbiology: Recent Results (from the past 240 hour(s))  Blood  culture (routine x 2)     Status: None   Collection Time: 01/10/19  1:09 AM  Result Value Ref Range Status   Specimen Description BLOOD RIGHT FOREARM  Final   Special Requests   Final    BOTTLES DRAWN AEROBIC AND ANAEROBIC Blood Culture adequate volume   Culture   Final    NO GROWTH 5 DAYS Performed at [facility], [address], [place], [place] [address]    Report Status 01/15/2019 FINAL  Final  Blood culture (routine x 2)     Status: None   Collection Time: 01/10/19  1:36 AM  Result Value Ref Range Status   Specimen Description LEFT ANTECUBITAL  Final   Special Requests   Final    BOTTLES DRAWN AEROBIC AND ANAEROBIC Blood Culture adequate volume   Culture   Final    NO GROWTH 5 DAYS Performed at [facility], [address] [place], [place] [address]    Report Status 01/15/2019 FINAL  Final     Labs: BNP (last 3 results) Recent Labs    01/10/19 0013  BNP 0,539.7*   Basic Metabolic Panel: Recent Labs  Lab 01/15/19 0547 01/16/19 0607 01/16/19 1238 01/17/19 0605 01/18/19 0516  NA 142 144 144 144 144  K 3.6 3.3* 3.3* 3.8 4.1  CL 98 98 98 101 102  CO2 35* 35* 36* 35* 34*  GLUCOSE 177* 149* 269* 135* 195*  BUN 39* 41* 40* 38* 32*  CREATININE 1.06 1.31* 1.39* 1.23 1.07  CALCIUM 9.4 9.3 8.8* 9.1 9.1  MG 2.2  --   --  2.1 2.0  PHOS  --   --  2.0*  --   --    Liver Function Tests: Recent Labs  Lab 01/16/19 1238  ALBUMIN 3.2*   No results for input(s): LIPASE, AMYLASE in the last 168 hours. No results for input(s): AMMONIA in the last 168 hours. CBC: Recent Labs  Lab 01/15/19 0547 01/18/19 0516  WBC 12.1* 10.3  HGB 13.1 11.6*  HCT 45.2 39.9  MCV 99.1 99.3  PLT 126* 105*   Cardiac Enzymes: No results for input(s): CKTOTAL, CKMB, CKMBINDEX, TROPONINI in the last 168 hours. BNP: Invalid input(s): POCBNP CBG: Recent Labs  Lab 01/17/19 2215 01/18/19 0730 01/18/19 1118 01/18/19 1657 01/18/19 1707  GLUCAP 239* 176* 234* 232* 241*   D-Dimer No  results for input(s): DDIMER in the last 72 hours. Hgb A1c No results for input(s): HGBA1C in the last 72 hours. Lipid Profile No results for input(s): CHOL, HDL, LDLCALC, TRIG, CHOLHDL, LDLDIRECT in the last 72 hours. Thyroid function studies No results for input(s): TSH, T4TOTAL, T3FREE, THYROIDAB in the last 72 hours.  Invalid input(s): FREET3 Anemia work up No results for input(s): VITAMINB12, FOLATE, FERRITIN, TIBC, IRON, RETICCTPCT in the last 72 hours. Urinalysis    Component Value Date/Time   COLORURINE YELLOW 01/10/2019 0148   APPEARANCEUR CLEAR 01/10/2019 0148   LABSPEC 1.024 01/10/2019 0148   PHURINE 5.0 01/10/2019 0148   GLUCOSEU 50 (A) 01/10/2019 0148   HGBUR SMALL (A) 01/10/2019 0148   BILIRUBINUR NEGATIVE 01/10/2019 0148   KETONESUR NEGATIVE 01/10/2019 0148   PROTEINUR 100 (A) 01/10/2019 0148   NITRITE NEGATIVE 01/10/2019 0148   LEUKOCYTESUR NEGATIVE 01/10/2019 0148   Sepsis Labs Invalid input(s): PROCALCITONIN,  WBC,  LACTICIDVEN Microbiology Recent Results (from the past 240 hour(s))  Blood culture (routine x 2)     Status: None   Collection Time: 01/10/19  1:09 AM  Result Value Ref Range Status   Specimen Description BLOOD RIGHT FOREARM  Final   Special Requests   Final    BOTTLES DRAWN AEROBIC AND ANAEROBIC Blood Culture adequate volume   Culture   Final    NO GROWTH 5 DAYS Performed at [facility], [address], [place], [place] [address]    Report Status 01/15/2019 FINAL  Final  Blood culture (routine x 2)     Status: None   Collection Time: 01/10/19  1:36 AM  Result Value Ref Range Status   Specimen Description LEFT ANTECUBITAL  Final   Special Requests   Final  BOTTLES DRAWN AEROBIC AND ANAEROBIC Blood Culture adequate volume   Culture   Final    NO GROWTH 5 DAYS Performed at [facility], [address], [place], [place] [address]    Report Status 01/15/2019 FINAL  Final   Time coordinating discharge: 38 mins  SIGNED:  Irwin Brakeman, MD  [facility] 01/18/2019, 5:53 PM How to contact the [facility] Attending or Consulting provider [place] or covering provider during after hours [place], for this patient?  1. Check the care team in [facility] and look for a) attending/consulting TR[facility] provider listed and b) the [facility] team listed 2. Log into [email or web address] and use [place]'s universal password to access. If you do not have the password, please contact the hospital operator. 3. Locate the [facility] provider you are looking for under [facility] and page to a number that you can be directly reached. 4. If you still have difficulty reaching the provider, please page the [facility] (Director on Call) for the Hospitalists listed on amion for assistance.

## 2019-01-18 NOTE — Care Management Note (Signed)
Case Management Note  Patient Details  Name: Gary Odonnell MRN: [id number] Date of Birth: [birth year]  Action/Plan: DC home today. Pt aware HH has 48 hrs to make first visit. Vaughan Basta, [facility] rep, aware of DC today. Pt wants hospital bed. Referral for bed and new qualifying sats for home O2 routed to [facility].   Expected Discharge Date:  01/11/19               Expected Discharge Plan:  [place]  In-House Referral:  NA  Discharge planning Services  CM Consult  Post Acute Care Choice:  Home Health, Durable Medical Equipment Choice offered to:  Patient  DME Arranged:  [facility] bed DME Agency:  [place] Arranged:  PT, RN [facility] Agency:  [place]  Status of Service:  Completed, signed off  Sherald Barge, RN 01/18/2019, 1:18 PM

## 2019-01-18 NOTE — Care Management (Signed)
Patient requires frequent re-positioning of the body in ways that cannot be achieved with an ordinary bed or wedge pillow, to eliminate pain, reduce pressure, and the head of the bed to be elevated more than 30 degrees most of the time due to CHF 

## 2019-01-18 NOTE — Progress Notes (Signed)
Progress Note  Patient Name: Gary Odonnell Date of Encounter: 01/18/2019  Primary Cardiologist: Dr. Carlyle Dolly  Subjective   Breathing more easily, comfortable at rest.  No chest pain or palpitations.  Inpatient Medications    Scheduled Meds: . apixaban  5 mg Oral BID  . budesonide (PULMICORT) nebulizer solution  0.25 mg Nebulization BID  . carvedilol  6.25 mg Oral BID WC  . ezetimibe  10 mg Oral Daily  . fluticasone  2 spray Each Nare Daily  . furosemide  40 mg Oral BID  . guaiFENesin  1,200 mg Oral BID  . hydrALAZINE  25 mg Oral Q8H  . insulin aspart  0-5 Units Subcutaneous QHS  . insulin aspart  0-9 Units Subcutaneous TID WC  . insulin aspart  3 Units Subcutaneous TID WC  . ipratropium-albuterol  3 mL Nebulization Q6H WA  . nicotine  14 mg Transdermal Daily  . oseltamivir  30 mg Oral Daily  . potassium chloride  20 mEq Oral BID  . predniSONE  40 mg Oral Q breakfast  . sacubitril-valsartan  1 tablet Oral BID  . sodium chloride flush  3 mL Intravenous Q12H  . sodium chloride flush  3 mL Intravenous Q12H   Continuous Infusions: . sodium chloride 250 mL (01/10/19 2201)   PRN Meds: sodium chloride, acetaminophen **OR** acetaminophen, albuterol, hydrALAZINE, HYDROcodone-acetaminophen, ondansetron **OR** ondansetron (ZOFRAN) IV, senna-docusate, sodium chloride, sodium chloride flush   Vital Signs    Vitals:   01/17/19 2108 01/18/19 0513 01/18/19 0809 01/18/19 0812  BP: 140/64 (!) 149/67    Pulse: 80 80    Resp:      Temp: 98.7 F (37.1 C) 97.8 F (36.6 C)    TempSrc: Oral Oral    SpO2: 94% 97% 96% 100%  Weight:  93.9 kg    Height:        Intake/Output Summary (Last 24 hours) at 01/18/2019 0928 Last data filed at 01/18/2019 0500 Gross per 24 hour  Intake 240 ml  Output 2900 ml  Net -2660 ml   Filed Weights   01/15/19 0517 01/16/19 0549 01/18/19 0513  Weight: 111.7 kg 92 kg 93.9 kg    Telemetry    Currently not on telemetry.  Physical Exam    GEN:  Elderly male.  No acute distress.   Neck: No JVD. Cardiac:  Irregularly irregular, no gallop.  Respiratory:  Decreased breath sounds at the bases.  Nonlabored. GI: Soft, nontender, bowel sounds present. MS: No edema; No deformity. Neuro:  Nonfocal. Psych: Alert and oriented x 3. Normal affect.  Labs    Chemistry Recent Labs  Lab 01/16/19 1238 01/17/19 0605 01/18/19 0516  NA 144 144 144  K 3.3* 3.8 4.1  CL 98 101 102  CO2 36* 35* 34*  GLUCOSE 269* 135* 195*  BUN 40* 38* 32*  CREATININE 1.39* 1.23 1.07  CALCIUM 8.8* 9.1 9.1  ALBUMIN 3.2*  --   --   GFRNONAA 46* 54* >60  GFRAA 54* >60 >60  ANIONGAP 10 8 8      Hematology Recent Labs  Lab 01/15/19 0547 01/18/19 0516  WBC 12.1* 10.3  RBC 4.56 4.02*  HGB 13.1 11.6*  HCT 45.2 39.9  MCV 99.1 99.3  MCH 28.7 28.9  MCHC 29.0* 29.1*  RDW 16.4* 16.4*  PLT 126* 105*    Radiology    No results found.  Cardiac Studies   Echocardiogram 01/11/2019:  1. The left ventricle has moderate-severely reduced systolic function, with an ejection  fraction of 30-35%. The cavity size was mildly dilated. There is moderately increased left ventricular wall thickness. Left ventricular diastolic Doppler parameters  are indeterminate.  2. The right ventricle has normal systolic function. The cavity was normal. There is no increase in right ventricular wall thickness.  3. Left atrial size was moderately dilated.  4. Trivial pericardial effusion is present.  5. The mitral valve is degenerative. Moderate thickening of the mitral valve leaflet. Moderate calcification of the mitral valve leaflet. There is moderate mitral annular calcification present.  6. The tricuspid valve is normal in structure.  7. The aortic valve is tricuspid Moderate thickening of the aortic valve Moderate calcification of the aortic valve. mild stenosis of the aortic valve.  8. The pulmonic valve was grossly normal. Pulmonic valve regurgitation is mild by color  flow Doppler.  9. Right atrial pressure is estimated at 8 mmHg.  Patient Profile     85 y.o. male with a history of CAD (s/p CABG with cath in 2014 showing patent LIMA-LAD with occluded Free Radial to OM1-OM2-PDA and occluded RCA with L-->R collaterals), PVD, ischemic cardiomyopathy, HTN, HLD, and recently diagnosed atrial fibrillation (diagnosed 12/27/2018 and started on Eliquis) who is currently admitted for an acute CHF exacerbation.  Assessment & Plan    1.  Acute on chronic combined heart failure with LVEF 30 to 35%.  He feels better with diuresis, had an additional 2300 cc out more than in last 24 hours with Foley catheter in place.  Previously not on diuretic at home, now on Lasix 40 mg twice daily with creatinine decreasing.  2.  Persistent atrial fibrillation.  Heart rate is controlled at this time on Coreg and he is on Eliquis for stroke prophylaxis.  Will be considered for elective cardioversion after 4 weeks of anticoagulation (recent diagnosis).  3.  Reported CKD stage III at baseline although current creatinine has improved to 1.07 on diuretics.  Prior baseline 1.4-1.5.  4.  Essential hypertension, blood pressure control is adequate.  He is on Coreg, hydralazine, and Entresto at intermediate dose.  5.  Mixed hyperlipidemia with statin intolerance.  He is on Zetia.  6.  Multivessel CAD status post CABG in 2014.  No active angina symptoms.  He is not on aspirin given concurrent use of Eliquis.  Discussed with patient and family members present.  Plan is to get Foley catheter out today per primary team and have voiding trial.  If stable he may be able to go home.  He already has a follow-up visit scheduled with Dr. Harl Bowie on March 12.  In the meanwhile would discharge on current doses of Eliquis, Coreg, Zetia, hydralazine, and Entresto.  Would change Lasix to 60 mg once daily and continue KCl 20 mEq daily.  He will need a follow-up BMET for his office encounter.  Ideally, he could be  started on Aldactone with perhaps reduction in Lasix dose - this can be addressed as an outpatient.  Signed, Rozann Lesches, MD  01/18/2019, 9:28 AM

## 2019-01-18 NOTE — Progress Notes (Signed)
SATURATION QUALIFICATIONS: (This note is used to comply with regulatory documentation for home oxygen)  Patient Saturations on Room Air at Rest = 95%  Patient Saturations on Room Air while Ambulating = 83%  Patient Saturations on 2 Liters of oxygen while Ambulating = 90%  Please briefly explain why patient needs home oxygen:

## 2019-01-19 ENCOUNTER — Other Ambulatory Visit: Payer: Self-pay

## 2019-01-19 MED ORDER — EZETIMIBE 10 MG PO TABS: 10.0000 mg | ORAL_TABLET | Freq: Every day | ORAL | 2 refills | Status: DC

## 2019-01-20 DIAGNOSIS — Z85118 Personal history of other malignant neoplasm of bronchus and lung: Secondary | ICD-10-CM | POA: Diagnosis not present

## 2019-01-20 DIAGNOSIS — Z9181 History of falling: Secondary | ICD-10-CM | POA: Diagnosis not present

## 2019-01-20 DIAGNOSIS — I130 Hypertensive heart and chronic kidney disease with heart failure and stage 1 through stage 4 chronic kidney disease, or unspecified chronic kidney disease: Secondary | ICD-10-CM | POA: Diagnosis not present

## 2019-01-20 DIAGNOSIS — J440 Chronic obstructive pulmonary disease with acute lower respiratory infection: Secondary | ICD-10-CM | POA: Diagnosis not present

## 2019-01-20 DIAGNOSIS — J441 Chronic obstructive pulmonary disease with (acute) exacerbation: Secondary | ICD-10-CM | POA: Diagnosis not present

## 2019-01-20 DIAGNOSIS — I2510 Atherosclerotic heart disease of native coronary artery without angina pectoris: Secondary | ICD-10-CM | POA: Diagnosis not present

## 2019-01-20 DIAGNOSIS — J189 Pneumonia, unspecified organism: Secondary | ICD-10-CM | POA: Diagnosis not present

## 2019-01-20 DIAGNOSIS — I5042 Chronic combined systolic (congestive) and diastolic (congestive) heart failure: Secondary | ICD-10-CM | POA: Diagnosis not present

## 2019-01-20 DIAGNOSIS — N183 Chronic kidney disease, stage 3 (moderate): Secondary | ICD-10-CM | POA: Diagnosis not present

## 2019-01-20 DIAGNOSIS — Z20828 Contact with and (suspected) exposure to other viral communicable diseases: Secondary | ICD-10-CM | POA: Diagnosis not present

## 2019-01-20 DIAGNOSIS — Z9981 Dependence on supplemental oxygen: Secondary | ICD-10-CM | POA: Diagnosis not present

## 2019-01-20 DIAGNOSIS — I480 Paroxysmal atrial fibrillation: Secondary | ICD-10-CM | POA: Diagnosis not present

## 2019-01-20 DIAGNOSIS — F17290 Nicotine dependence, other tobacco product, uncomplicated: Secondary | ICD-10-CM | POA: Diagnosis not present

## 2019-01-20 DIAGNOSIS — Z923 Personal history of irradiation: Secondary | ICD-10-CM | POA: Diagnosis not present

## 2019-01-20 DIAGNOSIS — Z951 Presence of aortocoronary bypass graft: Secondary | ICD-10-CM | POA: Diagnosis not present

## 2019-01-21 DIAGNOSIS — Z6836 Body mass index (BMI) 36.0-36.9, adult: Secondary | ICD-10-CM | POA: Diagnosis not present

## 2019-01-21 DIAGNOSIS — I509 Heart failure, unspecified: Secondary | ICD-10-CM | POA: Diagnosis not present

## 2019-01-21 DIAGNOSIS — J441 Chronic obstructive pulmonary disease with (acute) exacerbation: Secondary | ICD-10-CM | POA: Diagnosis not present

## 2019-01-21 DIAGNOSIS — J440 Chronic obstructive pulmonary disease with acute lower respiratory infection: Secondary | ICD-10-CM | POA: Diagnosis not present

## 2019-01-21 DIAGNOSIS — J449 Chronic obstructive pulmonary disease, unspecified: Secondary | ICD-10-CM | POA: Diagnosis not present

## 2019-01-21 DIAGNOSIS — I10 Essential (primary) hypertension: Secondary | ICD-10-CM | POA: Diagnosis not present

## 2019-01-21 DIAGNOSIS — I5042 Chronic combined systolic (congestive) and diastolic (congestive) heart failure: Secondary | ICD-10-CM | POA: Diagnosis not present

## 2019-01-21 DIAGNOSIS — J189 Pneumonia, unspecified organism: Secondary | ICD-10-CM | POA: Diagnosis not present

## 2019-01-21 DIAGNOSIS — I130 Hypertensive heart and chronic kidney disease with heart failure and stage 1 through stage 4 chronic kidney disease, or unspecified chronic kidney disease: Secondary | ICD-10-CM | POA: Diagnosis not present

## 2019-01-21 DIAGNOSIS — Z299 Encounter for prophylactic measures, unspecified: Secondary | ICD-10-CM | POA: Diagnosis not present

## 2019-01-21 DIAGNOSIS — I2510 Atherosclerotic heart disease of native coronary artery without angina pectoris: Secondary | ICD-10-CM | POA: Diagnosis not present

## 2019-01-22 DIAGNOSIS — I5042 Chronic combined systolic (congestive) and diastolic (congestive) heart failure: Secondary | ICD-10-CM | POA: Diagnosis not present

## 2019-01-22 DIAGNOSIS — I2510 Atherosclerotic heart disease of native coronary artery without angina pectoris: Secondary | ICD-10-CM | POA: Diagnosis not present

## 2019-01-22 DIAGNOSIS — J189 Pneumonia, unspecified organism: Secondary | ICD-10-CM | POA: Diagnosis not present

## 2019-01-22 DIAGNOSIS — J440 Chronic obstructive pulmonary disease with acute lower respiratory infection: Secondary | ICD-10-CM | POA: Diagnosis not present

## 2019-01-22 DIAGNOSIS — I130 Hypertensive heart and chronic kidney disease with heart failure and stage 1 through stage 4 chronic kidney disease, or unspecified chronic kidney disease: Secondary | ICD-10-CM | POA: Diagnosis not present

## 2019-01-22 DIAGNOSIS — J441 Chronic obstructive pulmonary disease with (acute) exacerbation: Secondary | ICD-10-CM | POA: Diagnosis not present

## 2019-01-25 DIAGNOSIS — R338 Other retention of urine: Secondary | ICD-10-CM | POA: Diagnosis not present

## 2019-01-26 DIAGNOSIS — I2510 Atherosclerotic heart disease of native coronary artery without angina pectoris: Secondary | ICD-10-CM | POA: Diagnosis not present

## 2019-01-26 DIAGNOSIS — J189 Pneumonia, unspecified organism: Secondary | ICD-10-CM | POA: Diagnosis not present

## 2019-01-26 DIAGNOSIS — I130 Hypertensive heart and chronic kidney disease with heart failure and stage 1 through stage 4 chronic kidney disease, or unspecified chronic kidney disease: Secondary | ICD-10-CM | POA: Diagnosis not present

## 2019-01-26 DIAGNOSIS — J441 Chronic obstructive pulmonary disease with (acute) exacerbation: Secondary | ICD-10-CM | POA: Diagnosis not present

## 2019-01-26 DIAGNOSIS — I5042 Chronic combined systolic (congestive) and diastolic (congestive) heart failure: Secondary | ICD-10-CM | POA: Diagnosis not present

## 2019-01-26 DIAGNOSIS — J440 Chronic obstructive pulmonary disease with acute lower respiratory infection: Secondary | ICD-10-CM | POA: Diagnosis not present

## 2019-01-28 DIAGNOSIS — I5042 Chronic combined systolic (congestive) and diastolic (congestive) heart failure: Secondary | ICD-10-CM | POA: Diagnosis not present

## 2019-01-28 DIAGNOSIS — J441 Chronic obstructive pulmonary disease with (acute) exacerbation: Secondary | ICD-10-CM | POA: Diagnosis not present

## 2019-01-28 DIAGNOSIS — I2510 Atherosclerotic heart disease of native coronary artery without angina pectoris: Secondary | ICD-10-CM | POA: Diagnosis not present

## 2019-01-28 DIAGNOSIS — J189 Pneumonia, unspecified organism: Secondary | ICD-10-CM | POA: Diagnosis not present

## 2019-01-28 DIAGNOSIS — I130 Hypertensive heart and chronic kidney disease with heart failure and stage 1 through stage 4 chronic kidney disease, or unspecified chronic kidney disease: Secondary | ICD-10-CM | POA: Diagnosis not present

## 2019-01-28 DIAGNOSIS — J440 Chronic obstructive pulmonary disease with acute lower respiratory infection: Secondary | ICD-10-CM | POA: Diagnosis not present

## 2019-01-29 DIAGNOSIS — J189 Pneumonia, unspecified organism: Secondary | ICD-10-CM | POA: Diagnosis not present

## 2019-01-29 DIAGNOSIS — J440 Chronic obstructive pulmonary disease with acute lower respiratory infection: Secondary | ICD-10-CM | POA: Diagnosis not present

## 2019-01-29 DIAGNOSIS — I130 Hypertensive heart and chronic kidney disease with heart failure and stage 1 through stage 4 chronic kidney disease, or unspecified chronic kidney disease: Secondary | ICD-10-CM | POA: Diagnosis not present

## 2019-01-29 DIAGNOSIS — I2510 Atherosclerotic heart disease of native coronary artery without angina pectoris: Secondary | ICD-10-CM | POA: Diagnosis not present

## 2019-01-29 DIAGNOSIS — J441 Chronic obstructive pulmonary disease with (acute) exacerbation: Secondary | ICD-10-CM | POA: Diagnosis not present

## 2019-01-29 DIAGNOSIS — I5042 Chronic combined systolic (congestive) and diastolic (congestive) heart failure: Secondary | ICD-10-CM | POA: Diagnosis not present

## 2019-02-01 DIAGNOSIS — I5042 Chronic combined systolic (congestive) and diastolic (congestive) heart failure: Secondary | ICD-10-CM | POA: Diagnosis not present

## 2019-02-01 DIAGNOSIS — J440 Chronic obstructive pulmonary disease with acute lower respiratory infection: Secondary | ICD-10-CM | POA: Diagnosis not present

## 2019-02-01 DIAGNOSIS — J189 Pneumonia, unspecified organism: Secondary | ICD-10-CM | POA: Diagnosis not present

## 2019-02-01 DIAGNOSIS — I130 Hypertensive heart and chronic kidney disease with heart failure and stage 1 through stage 4 chronic kidney disease, or unspecified chronic kidney disease: Secondary | ICD-10-CM | POA: Diagnosis not present

## 2019-02-01 DIAGNOSIS — J441 Chronic obstructive pulmonary disease with (acute) exacerbation: Secondary | ICD-10-CM | POA: Diagnosis not present

## 2019-02-01 DIAGNOSIS — I2510 Atherosclerotic heart disease of native coronary artery without angina pectoris: Secondary | ICD-10-CM | POA: Diagnosis not present

## 2019-02-01 MED ORDER — PHENYLEPHRINE 40 MCG/ML (10ML) SYRINGE FOR IV PUSH (FOR BLOOD PRESSURE SUPPORT)
PREFILLED_SYRINGE | INTRAVENOUS | Status: AC
  Filled 2019-02-01: qty 10

## 2019-02-01 MED ORDER — ROCURONIUM BROMIDE 100 MG/10ML IV SOLN
INTRAVENOUS | Status: AC
  Filled 2019-02-01: qty 1

## 2019-02-01 MED ORDER — EPHEDRINE 5 MG/ML INJ
INTRAVENOUS | Status: AC
  Filled 2019-02-01: qty 10

## 2019-02-02 DIAGNOSIS — I2510 Atherosclerotic heart disease of native coronary artery without angina pectoris: Secondary | ICD-10-CM | POA: Diagnosis not present

## 2019-02-02 DIAGNOSIS — J189 Pneumonia, unspecified organism: Secondary | ICD-10-CM | POA: Diagnosis not present

## 2019-02-02 DIAGNOSIS — J440 Chronic obstructive pulmonary disease with acute lower respiratory infection: Secondary | ICD-10-CM | POA: Diagnosis not present

## 2019-02-02 DIAGNOSIS — I5042 Chronic combined systolic (congestive) and diastolic (congestive) heart failure: Secondary | ICD-10-CM | POA: Diagnosis not present

## 2019-02-02 DIAGNOSIS — J441 Chronic obstructive pulmonary disease with (acute) exacerbation: Secondary | ICD-10-CM | POA: Diagnosis not present

## 2019-02-02 DIAGNOSIS — I130 Hypertensive heart and chronic kidney disease with heart failure and stage 1 through stage 4 chronic kidney disease, or unspecified chronic kidney disease: Secondary | ICD-10-CM | POA: Diagnosis not present

## 2019-02-03 DIAGNOSIS — R338 Other retention of urine: Secondary | ICD-10-CM | POA: Diagnosis not present

## 2019-02-04 ENCOUNTER — Other Ambulatory Visit: Payer: Self-pay

## 2019-02-04 ENCOUNTER — Ambulatory Visit (INDEPENDENT_AMBULATORY_CARE_PROVIDER_SITE_OTHER): Payer: Medicare Other | Admitting: Cardiology

## 2019-02-04 ENCOUNTER — Encounter: Payer: Self-pay | Admitting: Cardiology

## 2019-02-04 VITALS — BP 100/47 | HR 68 | Ht 71.5 in | Wt 176.6 lb

## 2019-02-04 DIAGNOSIS — I4819 Other persistent atrial fibrillation: Secondary | ICD-10-CM

## 2019-02-04 DIAGNOSIS — I5042 Chronic combined systolic (congestive) and diastolic (congestive) heart failure: Secondary | ICD-10-CM

## 2019-02-04 DIAGNOSIS — J440 Chronic obstructive pulmonary disease with acute lower respiratory infection: Secondary | ICD-10-CM | POA: Diagnosis not present

## 2019-02-04 DIAGNOSIS — J441 Chronic obstructive pulmonary disease with (acute) exacerbation: Secondary | ICD-10-CM | POA: Diagnosis not present

## 2019-02-04 DIAGNOSIS — I2510 Atherosclerotic heart disease of native coronary artery without angina pectoris: Secondary | ICD-10-CM | POA: Diagnosis not present

## 2019-02-04 DIAGNOSIS — I130 Hypertensive heart and chronic kidney disease with heart failure and stage 1 through stage 4 chronic kidney disease, or unspecified chronic kidney disease: Secondary | ICD-10-CM | POA: Diagnosis not present

## 2019-02-04 DIAGNOSIS — J189 Pneumonia, unspecified organism: Secondary | ICD-10-CM | POA: Diagnosis not present

## 2019-02-04 MED ORDER — FUROSEMIDE 40 MG PO TABS: ORAL_TABLET | ORAL | 1 refills | Status: DC

## 2019-02-04 NOTE — Progress Notes (Signed)
Clinical Summary Mr. Colquhoun is a 85 y.o.male seen today for follow up of the following medical problems. This is a focused visit on history of afib and recent admission with systolic heart failure.   1. Afib - new diagnosis during 12/27/2018 ER visit. Eliquis started in clniic 12/29/17 - CHADS2Vasc score is (age x2, CHF, HTN, stroke x2, CAD) is 7  - no recent palpitations.    2. CAD/ICM/Chronic systolic heart failure - history of prior CABG 2003 in [place]  - 09/2013 admission to [facility] with decompensated heart failure. Workup included an echo which showed an LVEF of 15%. He was transferred to [facility] for further management.  - cath 09/2013 showed LM 30%, LAD 100% proximal, LCX 50% ostial, RCA 100% mid with distal vessel filling with right to right and left to right collaterals. LIMA-LAD patent, graft from [place] to OM1, OM2, PDA occluded. No PCI targets, recommendations for medical management. RHC mean PA 43, wedge 22, CI 1.74.  - repeat echo 04/2014 shows LVEF improved to 40-45%. - 01/2018 echo LVEF 35-40%, grade II diastolic dysfunction.   12/2018 admission with acute on chronic combined systolic/diastolic HF - 06/4131 echo LVEF 30-35% - discharge weight reportedly 207 lbs and appears inaccurate. Clinic weights from Dec and Feb 196 to 197 lbs. - reported home weights 180 lbs, he is 176 lbs by our scales.      3. COPD exacerbation -managed 12/2018 during admission    4. Urinary retention - discharged with home foley after 12/2018 admission - followed by urology.  Past Medical History:  Diagnosis Date  . Cancer ([place])    L lung  . Coronary artery disease   . Hypercholesteremia   . Hypertension   . PAD (peripheral artery disease) ([place])   . Pneumonia 09/2013  . Radiation april 2016  . Solitary pulmonary nodule on lung CT 12/13/13   11/15/14  . Stroke [facility]) 11/2008     No Known Allergies   Current Outpatient Medications  Medication Sig Dispense Refill   . apixaban (ELIQUIS) 5 MG TABS tablet Take 1 tablet (5 mg total) by mouth 2 (two) times daily. 56 tablet 0  . Carboxymethylcellulose Sodium (EYE DROPS OP) Apply 1 drop to eye daily as needed (dry eyes).     . carvedilol (COREG) 6.25 MG tablet Take 1 tablet (6.25 mg total) by mouth 2 (two) times daily with a meal. 60 tablet 0  . ezetimibe (ZETIA) 10 MG tablet Take 1 tablet (10 mg total) by mouth daily. 90 tablet 2  . fluticasone (FLONASE) 50 MCG/ACT nasal spray Place 2 sprays into both nostrils daily. 16 g 2  . furosemide (LASIX) 20 MG tablet Take 3 tablets (60 mg total) by mouth daily for 30 days. 90 tablet 1  . guaiFENesin (MUCINEX) 600 MG 12 hr tablet Take 2 tablets (1,200 mg total) by mouth 2 (two) times daily. 30 tablet 0  . hydrALAZINE (APRESOLINE) 25 MG tablet Take 1 tablet (25 mg total) by mouth every 8 (eight) hours for 30 days. 90 tablet 0  . ipratropium (ATROVENT) 0.02 % nebulizer solution Take 2.5 mLs (0.5 mg total) by nebulization 4 (four) times daily for 30 days. 300 mL 0  . levalbuterol (XOPENEX) 0.63 MG/3ML nebulizer solution Take 3 mLs (0.63 mg total) by nebulization 4 (four) times daily for 30 days. 360 mL 0  . metFORMIN (GLUCOPHAGE XR) 500 MG 24 hr tablet Take 1 tablet (500 mg total) by mouth daily with breakfast  for 14 days. 14 tablet 0  . nicotine (NICODERM CQ - DOSED IN MG/24 HOURS) 14 mg/24hr patch Place 1 patch (14 mg total) onto the skin daily. 28 patch 0  . potassium chloride (K-DUR,KLOR-CON) 20 MEQ tablet Take 1 tablet (20 mEq total) by mouth daily for 30 days. 30 tablet 0  . predniSONE (DELTASONE) 10 MG tablet Take 3 tabs po daily with breakfast x 5 days, then 2 tabs x 5 days, 1 tab x 5 days, then stop 30 tablet 0  . sacubitril-valsartan (ENTRESTO) 49-51 MG Take 1 tablet by mouth 2 (two) times daily for 30 days. 60 tablet 0   No current facility-administered medications for this visit.      Past Surgical History:  Procedure Laterality Date  . Angioplasty to Left  femoral artery    . CARPAL TUNNEL RELEASE Left 05/11/2014   Procedure: LEFT CARPAL TUNNEL RELEASE;  Surgeon: Carole Civil, MD;  Location: [facility] OR[facility];  Service: Orthopedics;  Laterality: Left;  . COLONOSCOPY N/A 01/24/2015   Procedure: COLONOSCOPY;  Surgeon: Danie Binder, MD;  Location: [facility] END[facility] SUITE;  Service: Endoscopy;  Laterality: N/A;  130  . CORONARY ARTERY BYPASS GRAFT     x4  . FEMORAL-POPLITEAL BYPASS GRAFT Right 10/23/2015   Procedure: BYPASS GRAFT FEMORAL TO BELOW KNEE POPLITEAL ARTERY USING RIGHT NON-REVERSED GREATER SAPPHENOUS VEIN;  Surgeon: [name], MD;  Location: [facility];  Service: Vascular;  Laterality: Right;  . INTRAOPERATIVE ARTERIOGRAM Right 10/23/2015   Procedure: INTRA OPERATIVE ARTERIOGRAM - RIGHT LOWER LEG;  Surgeon: [name], MD;  Location: [facility];  Service: Vascular;  Laterality: Right;  . KNEE ARTHROSCOPY Right   . LEFT AND RIGHT HEART CATHETERIZATION WITH CORONARY ANGIOGRAM N/A 09/29/2013   Procedure: LEFT AND RIGHT HEART CATHETERIZATION WITH CORONARY ANGIOGRAM;  Surgeon: Burnell Blanks, MD;  Location: [facility] CATH LAB;  Service: Cardiovascular;  Laterality: N/A;  . PERIPHERAL VASCULAR CATHETERIZATION N/A 04/21/2015   Procedure: Abdominal Aortogram w/Lower Extremity;  Surgeon: [name], MD;  Location: [place] CV LAB;  Service: Cardiovascular;  Laterality: N/A;  . Stent to right femoral artery    . TRIGGER FINGER RELEASE Bilateral   . VEIN HARVEST Right 10/23/2015   Procedure: [place];  Surgeon: [name], MD;  Location: [facility] OR;  Service: Vascular;  Laterality: Right;     No Known Allergies    Family History  Problem Relation Age of Onset  . Heart attack Father   . Hyperlipidemia Father   . Diabetes Mother   . Diabetes Brother   . Colon cancer Neg Hx      Social History Mr. Karlen reports that he has been smoking cigars. He started smoking about 69 years ago. He has a 60.00 pack-year  smoking history. He has never used smokeless tobacco. Mr. Vanwyk reports no history of alcohol use.   Review of Systems CONSTITUTIONAL: No weight loss, fever, chills, weakness or fatigue.  HEENT: Eyes: No visual loss, blurred vision, double vision or yellow sclerae.No hearing loss, sneezing, congestion, runny nose or sore throat.  SKIN: No rash or itching.  CARDIOVASCULAR: per hpi RESPIRATORY: No shortness of breath, cough or sputum.  GASTROINTESTINAL: No anorexia, nausea, vomiting or diarrhea. No abdominal pain or blood.  GENITOURINARY: No burning on urination, no polyuria NEUROLOGICAL: No headache, dizziness, syncope, paralysis, ataxia, numbness or tingling in the extremities. No change in bowel or bladder control.  MUSCULOSKELETAL: No muscle, back pain, joint pain or  stiffness.  LYMPHATICS: No enlarged nodes. No history of splenectomy.  PSYCHIATRIC: No history of depression or anxiety.  ENDOCRINOLOGIC: No reports of sweating, cold or heat intolerance. No polyuria or polydipsia.  Marland Kitchen   Physical Examination Today's Vitals   02/04/19 1315 02/04/19 1322  BP: (!) 89/44 (!) 100/47  Pulse: 68   SpO2: 95%   Weight: 176 lb 9.6 oz (80.1 kg)   Height: 5' 11.5" (1.816 m)    Body mass index is 24.29 kg/m.  Gen: resting comfortably, no acute distress HEENT: no scleral icterus, pupils equal round and reactive, no palptable cervical adenopathy,  CV: irreg, no m/r/g, no jvd Resp: Clear to auscultation bilaterally GI: abdomen is soft, non-tender, non-distended, normal bowel sounds, no hepatosplenomegaly MSK: extremities are warm, no edema.  Skin: warm, no rash Neuro:  no focal deficits Psych: appropriate affect   Diagnostic Studies 09/29/13 Cath Hemodynamic Findings: Ao: 162/70  LV: 163/19/29  RA: 12 RV: 66/7/16  PA: 67/26 (mean 43)  PCWP: 22  Fick Cardiac Output: 3.5 L/min  Fick Cardiac Index: 1.74 L/min/m2  Central Aortic Saturation: 91%  Pulmonary Artery  Saturation: 50%  Angiographic Findings:  Left main: Distal 30% stenosis.  Left Anterior Descending Artery: 100% proximal occlusion. Proximal, mid and distal vessel fills from the patent IMA graft.  Circumflex Artery: Large caliber vessel with large intermediate Leydi Winstead. Diffuse non-obstructive plaque in the large bifurcating intermediate Barnes Florek. The ostium of the Circumflex has 50% stenosis.  Right Coronary Artery: Large dominant vessel with 100% mid occlusion. The distal vessel fills right to right bridging collaterals and left to right collaterals.  Graft Anatomy:  LIMA to LAD is patent  Free Radial (? Y graft from LIMA) to OM1, OM2, PDA is occluded.  Left Ventricular Angiogram: Deferred.  Impression:  1. Triple vessel CAD with patent LIMA to LAD, occluded RCA with good collateral filling, moderate disease in Circumflex  2. No focal targets for PCI  3. Ischemic cardiomyopathy with acute systolic CHF   64/4/03 Echo LVEF 15-20%, moderate LVH, severe diffuse hypokinesis, restrictive diastolic dysfunction, multiple WMAs, moderate MR, severe RV dysfunction RV TAPSE 0.7 cm, PASP 61 mmHg.   12/2013 Lab TC 175 TG 111 LDL 116 HDL 37  TSH 7.7 HgbA1c 6.6 Na 143 K 4.5 Cr 1.22 GFR56   04/2014 Echo Study Conclusions  - Left ventricle: The cavity size was normal. Wall thickness was increased in a pattern of mild LVH. Systolic function was mildly to moderately reduced. The estimated ejection fraction was in the range of 40% to 45%. There is diastolic dysfunction, indeterminant grade. - Regional wall motion abnormality: Hypokinesis of the mid anterior and basal-mid anteroseptal myocardium. - Aortic valve: Moderately calcified annulus. Trileaflet; moderately thickened leaflets. Valve area (VTI): 2.48 cm^2. Valve area (Vmax): 2.23 cm^2. - Mitral valve: Moderately calcified annulus. Mildly thickened leaflets . - Left atrium: The atrium was moderately dilated. - Right atrium:  The atrium was mildly dilated. - Technically adequate study.   01/2015 Carotid US bilaterall 1-39%    01/2018 echo Study Conclusions  - Left ventricle: The cavity size was normal. Wall thickness was increased in a pattern of moderate LVH. Systolic function was moderately reduced. The estimated ejection fraction was in the range of 35% to 40%. Diffuse hypokinesis. Features are consistent with a pseudonormal left ventricular filling pattern, with concomitant abnormal relaxation and increased filling pressure (grade 2 diastolic dysfunction). Doppler parameters are consistent with high ventricular filling pressure. - Regional wall motion abnormality: Moderate hypokinesis of the basal-mid  anteroseptal, mid inferoseptal, and mid inferior myocardium. - Aortic valve: Moderately calcified annulus. Mildly thickened, mildly calcified leaflets. There appears to be at least mild (if not mild to moderate) calcific aortic valvular stenosis. I suspect peak velocity and mean gradient is underestimated due to depressed LV function. Peak velocity (S): 159 cm/s. Mean gradient (S): 6 mm Hg. Valve area (VTI): 1.66 cm^2. Valve area (Vmax): 1.73 cm^2. Valve area (Vmean): 1.52 cm^2. - Mitral valve: Calcified annulus. Restricted leaflet motion due to depressed LV function. There was mild regurgitation. Valve area by continuity equation (using LVOT flow): 1.21 cm^2. - Left atrium: The atrium was mildly dilated. - Right ventricle: Systolic function was moderately reduced. - Tricuspid valve: There was mild regurgitation      Assessment and Plan   1.Persistent  Afib - new diagnosis during recent ER visit.  EKG today shows rate controlled afib - he has been on anticoag over  3 weeks, will wait for him to further recover from recent hospitalization and potentially plan for cardioversion after next f/u in 3-4 weeks  2. Chronic systolic HF - weight trends and  soft bp's today would suggest he is hypovolemic - order BMET/Mg. Hold lasix 2 days, restart at lower dose 40mg  after may take 60mg  if above 190 lbs.    F/u 3-4 weeks, potentally plan for cardioversion after that appointment.      Arnoldo Lenis, M.D.

## 2019-02-04 NOTE — Patient Instructions (Signed)
Your physician recommends that you schedule a follow-up appointment in: 3-4 [place] has recommended you make the following change in your medication:   HOLD LASIX FOR 2 DAYS - THEN TAKE LASIX 40 MG DAILY - MAY TAKE ADDITIONAL 60 MG (1 1/2 TABLETS) FOR WEIGHT GAIN OVER 190LBS  Your physician recommends that you return for lab work BMP/MG  Thank you for choosing [facility]!!

## 2019-02-05 ENCOUNTER — Other Ambulatory Visit (HOSPITAL_COMMUNITY)
Admission: RE | Admit: 2019-02-05 | Discharge: 2019-02-05 | Disposition: A | Discharge status: Home / Self Care | Payer: Medicare Other | Source: Ambulatory Visit | Attending: Hematology | Admitting: Hematology

## 2019-02-05 ENCOUNTER — Other Ambulatory Visit: Payer: Self-pay

## 2019-02-05 DIAGNOSIS — J189 Pneumonia, unspecified organism: Secondary | ICD-10-CM | POA: Diagnosis not present

## 2019-02-05 DIAGNOSIS — I2510 Atherosclerotic heart disease of native coronary artery without angina pectoris: Secondary | ICD-10-CM | POA: Diagnosis not present

## 2019-02-05 DIAGNOSIS — J440 Chronic obstructive pulmonary disease with acute lower respiratory infection: Secondary | ICD-10-CM | POA: Diagnosis not present

## 2019-02-05 DIAGNOSIS — I5042 Chronic combined systolic (congestive) and diastolic (congestive) heart failure: Secondary | ICD-10-CM | POA: Diagnosis not present

## 2019-02-05 DIAGNOSIS — I130 Hypertensive heart and chronic kidney disease with heart failure and stage 1 through stage 4 chronic kidney disease, or unspecified chronic kidney disease: Secondary | ICD-10-CM | POA: Diagnosis not present

## 2019-02-05 DIAGNOSIS — J441 Chronic obstructive pulmonary disease with (acute) exacerbation: Secondary | ICD-10-CM | POA: Diagnosis not present

## 2019-02-05 LAB — BASIC METABOLIC PANEL
Anion gap: 9 (ref 5–15)
BUN: 23 mg/dL (ref 8–23)
CO2: 29 mmol/L (ref 22–32)
Calcium: 9.5 mg/dL (ref 8.9–10.3)
Chloride: 101 mmol/L (ref 98–111)
Creatinine, Ser: 1.36 mg/dL — ABNORMAL HIGH (ref 0.61–1.24)
GFR calc Af Amer: 55 mL/min — ABNORMAL LOW (ref >60)
GFR, EST NON AFRICAN AMERICAN: 47 mL/min — AB (ref >60)
Glucose, Bld: 169 mg/dL — ABNORMAL HIGH (ref 70–99)
Potassium: 4.7 mmol/L (ref 3.5–5.1)
Sodium: 139 mmol/L (ref 135–145)

## 2019-02-05 LAB — MAGNESIUM: MAGNESIUM: 2.2 mg/dL (ref 1.7–2.4)

## 2019-02-09 DIAGNOSIS — I2510 Atherosclerotic heart disease of native coronary artery without angina pectoris: Secondary | ICD-10-CM | POA: Diagnosis not present

## 2019-02-09 DIAGNOSIS — J440 Chronic obstructive pulmonary disease with acute lower respiratory infection: Secondary | ICD-10-CM | POA: Diagnosis not present

## 2019-02-09 DIAGNOSIS — I130 Hypertensive heart and chronic kidney disease with heart failure and stage 1 through stage 4 chronic kidney disease, or unspecified chronic kidney disease: Secondary | ICD-10-CM | POA: Diagnosis not present

## 2019-02-09 DIAGNOSIS — J189 Pneumonia, unspecified organism: Secondary | ICD-10-CM | POA: Diagnosis not present

## 2019-02-09 DIAGNOSIS — I5042 Chronic combined systolic (congestive) and diastolic (congestive) heart failure: Secondary | ICD-10-CM | POA: Diagnosis not present

## 2019-02-09 DIAGNOSIS — J441 Chronic obstructive pulmonary disease with (acute) exacerbation: Secondary | ICD-10-CM | POA: Diagnosis not present

## 2019-02-12 ENCOUNTER — Other Ambulatory Visit: Payer: Self-pay | Admitting: *Deleted

## 2019-02-12 ENCOUNTER — Telehealth: Payer: Self-pay | Admitting: *Deleted

## 2019-02-12 MED ORDER — CARVEDILOL 6.25 MG PO TABS: 6.2500 mg | ORAL_TABLET | Freq: Two times a day (BID) | ORAL | 1 refills | Status: DC

## 2019-02-12 MED ORDER — POTASSIUM CHLORIDE CRYS ER 20 MEQ PO TBCR: 20.0000 meq | EXTENDED_RELEASE_TABLET | Freq: Every day | ORAL | 1 refills | Status: DC

## 2019-02-12 MED ORDER — EZETIMIBE 10 MG PO TABS: 10.0000 mg | ORAL_TABLET | Freq: Every day | ORAL | 1 refills | Status: DC

## 2019-02-12 NOTE — Telephone Encounter (Signed)
Pt aware and voiced understanding - routed to pcp  

## 2019-02-12 NOTE — Telephone Encounter (Signed)
-----   Message from Arnoldo Lenis, MD sent at 02/09/2019 11:34 AM EDT ----- Labs do show some signs of dehydration, should improve with lower dose of his fluid pill that we changed last visit.    Zandra Abts MD

## 2019-02-15 DIAGNOSIS — J440 Chronic obstructive pulmonary disease with acute lower respiratory infection: Secondary | ICD-10-CM | POA: Diagnosis not present

## 2019-02-15 DIAGNOSIS — I2510 Atherosclerotic heart disease of native coronary artery without angina pectoris: Secondary | ICD-10-CM | POA: Diagnosis not present

## 2019-02-15 DIAGNOSIS — J189 Pneumonia, unspecified organism: Secondary | ICD-10-CM | POA: Diagnosis not present

## 2019-02-15 DIAGNOSIS — I130 Hypertensive heart and chronic kidney disease with heart failure and stage 1 through stage 4 chronic kidney disease, or unspecified chronic kidney disease: Secondary | ICD-10-CM | POA: Diagnosis not present

## 2019-02-15 DIAGNOSIS — I5042 Chronic combined systolic (congestive) and diastolic (congestive) heart failure: Secondary | ICD-10-CM | POA: Diagnosis not present

## 2019-02-15 DIAGNOSIS — J441 Chronic obstructive pulmonary disease with (acute) exacerbation: Secondary | ICD-10-CM | POA: Diagnosis not present

## 2019-02-19 DIAGNOSIS — Z923 Personal history of irradiation: Secondary | ICD-10-CM | POA: Diagnosis not present

## 2019-02-19 DIAGNOSIS — Z951 Presence of aortocoronary bypass graft: Secondary | ICD-10-CM | POA: Diagnosis not present

## 2019-02-19 DIAGNOSIS — I480 Paroxysmal atrial fibrillation: Secondary | ICD-10-CM | POA: Diagnosis not present

## 2019-02-19 DIAGNOSIS — Z85118 Personal history of other malignant neoplasm of bronchus and lung: Secondary | ICD-10-CM | POA: Diagnosis not present

## 2019-02-19 DIAGNOSIS — J440 Chronic obstructive pulmonary disease with acute lower respiratory infection: Secondary | ICD-10-CM | POA: Diagnosis not present

## 2019-02-19 DIAGNOSIS — N183 Chronic kidney disease, stage 3 (moderate): Secondary | ICD-10-CM | POA: Diagnosis not present

## 2019-02-19 DIAGNOSIS — J189 Pneumonia, unspecified organism: Secondary | ICD-10-CM | POA: Diagnosis not present

## 2019-02-19 DIAGNOSIS — Z9981 Dependence on supplemental oxygen: Secondary | ICD-10-CM | POA: Diagnosis not present

## 2019-02-19 DIAGNOSIS — Z9181 History of falling: Secondary | ICD-10-CM | POA: Diagnosis not present

## 2019-02-19 DIAGNOSIS — I5042 Chronic combined systolic (congestive) and diastolic (congestive) heart failure: Secondary | ICD-10-CM | POA: Diagnosis not present

## 2019-02-19 DIAGNOSIS — J441 Chronic obstructive pulmonary disease with (acute) exacerbation: Secondary | ICD-10-CM | POA: Diagnosis not present

## 2019-02-19 DIAGNOSIS — I130 Hypertensive heart and chronic kidney disease with heart failure and stage 1 through stage 4 chronic kidney disease, or unspecified chronic kidney disease: Secondary | ICD-10-CM | POA: Diagnosis not present

## 2019-02-19 DIAGNOSIS — I2510 Atherosclerotic heart disease of native coronary artery without angina pectoris: Secondary | ICD-10-CM | POA: Diagnosis not present

## 2019-02-19 DIAGNOSIS — Z20828 Contact with and (suspected) exposure to other viral communicable diseases: Secondary | ICD-10-CM | POA: Diagnosis not present

## 2019-02-19 DIAGNOSIS — F17290 Nicotine dependence, other tobacco product, uncomplicated: Secondary | ICD-10-CM | POA: Diagnosis not present

## 2019-02-22 DIAGNOSIS — J440 Chronic obstructive pulmonary disease with acute lower respiratory infection: Secondary | ICD-10-CM | POA: Diagnosis not present

## 2019-02-22 DIAGNOSIS — J189 Pneumonia, unspecified organism: Secondary | ICD-10-CM | POA: Diagnosis not present

## 2019-02-22 DIAGNOSIS — I130 Hypertensive heart and chronic kidney disease with heart failure and stage 1 through stage 4 chronic kidney disease, or unspecified chronic kidney disease: Secondary | ICD-10-CM | POA: Diagnosis not present

## 2019-02-22 DIAGNOSIS — I2510 Atherosclerotic heart disease of native coronary artery without angina pectoris: Secondary | ICD-10-CM | POA: Diagnosis not present

## 2019-02-22 DIAGNOSIS — I5042 Chronic combined systolic (congestive) and diastolic (congestive) heart failure: Secondary | ICD-10-CM | POA: Diagnosis not present

## 2019-02-22 DIAGNOSIS — J441 Chronic obstructive pulmonary disease with (acute) exacerbation: Secondary | ICD-10-CM | POA: Diagnosis not present

## 2019-02-23 DIAGNOSIS — R338 Other retention of urine: Secondary | ICD-10-CM | POA: Diagnosis not present

## 2019-03-04 DIAGNOSIS — I2510 Atherosclerotic heart disease of native coronary artery without angina pectoris: Secondary | ICD-10-CM | POA: Diagnosis not present

## 2019-03-04 DIAGNOSIS — J441 Chronic obstructive pulmonary disease with (acute) exacerbation: Secondary | ICD-10-CM | POA: Diagnosis not present

## 2019-03-04 DIAGNOSIS — J440 Chronic obstructive pulmonary disease with acute lower respiratory infection: Secondary | ICD-10-CM | POA: Diagnosis not present

## 2019-03-04 DIAGNOSIS — I130 Hypertensive heart and chronic kidney disease with heart failure and stage 1 through stage 4 chronic kidney disease, or unspecified chronic kidney disease: Secondary | ICD-10-CM | POA: Diagnosis not present

## 2019-03-04 DIAGNOSIS — I5042 Chronic combined systolic (congestive) and diastolic (congestive) heart failure: Secondary | ICD-10-CM | POA: Diagnosis not present

## 2019-03-04 DIAGNOSIS — J189 Pneumonia, unspecified organism: Secondary | ICD-10-CM | POA: Diagnosis not present

## 2019-03-08 DIAGNOSIS — J189 Pneumonia, unspecified organism: Secondary | ICD-10-CM | POA: Diagnosis not present

## 2019-03-08 DIAGNOSIS — I130 Hypertensive heart and chronic kidney disease with heart failure and stage 1 through stage 4 chronic kidney disease, or unspecified chronic kidney disease: Secondary | ICD-10-CM | POA: Diagnosis not present

## 2019-03-08 DIAGNOSIS — I2510 Atherosclerotic heart disease of native coronary artery without angina pectoris: Secondary | ICD-10-CM | POA: Diagnosis not present

## 2019-03-08 DIAGNOSIS — J441 Chronic obstructive pulmonary disease with (acute) exacerbation: Secondary | ICD-10-CM | POA: Diagnosis not present

## 2019-03-08 DIAGNOSIS — J440 Chronic obstructive pulmonary disease with acute lower respiratory infection: Secondary | ICD-10-CM | POA: Diagnosis not present

## 2019-03-08 DIAGNOSIS — I5042 Chronic combined systolic (congestive) and diastolic (congestive) heart failure: Secondary | ICD-10-CM | POA: Diagnosis not present

## 2019-03-16 ENCOUNTER — Telehealth: Payer: Self-pay | Admitting: *Deleted

## 2019-03-16 DIAGNOSIS — I5042 Chronic combined systolic (congestive) and diastolic (congestive) heart failure: Secondary | ICD-10-CM | POA: Diagnosis not present

## 2019-03-16 DIAGNOSIS — I2510 Atherosclerotic heart disease of native coronary artery without angina pectoris: Secondary | ICD-10-CM | POA: Diagnosis not present

## 2019-03-16 DIAGNOSIS — J441 Chronic obstructive pulmonary disease with (acute) exacerbation: Secondary | ICD-10-CM | POA: Diagnosis not present

## 2019-03-16 DIAGNOSIS — J189 Pneumonia, unspecified organism: Secondary | ICD-10-CM | POA: Diagnosis not present

## 2019-03-16 DIAGNOSIS — I130 Hypertensive heart and chronic kidney disease with heart failure and stage 1 through stage 4 chronic kidney disease, or unspecified chronic kidney disease: Secondary | ICD-10-CM | POA: Diagnosis not present

## 2019-03-16 DIAGNOSIS — J440 Chronic obstructive pulmonary disease with acute lower respiratory infection: Secondary | ICD-10-CM | POA: Diagnosis not present

## 2019-03-16 NOTE — Telephone Encounter (Signed)
Pt verbally consented to telehealth appt with Dr Harl Bowie 03/19/19. Pt meds/allergies/pharmacy reviewed. Pt will check BP/HR/weight prior to appt

## 2019-03-19 ENCOUNTER — Telehealth (INDEPENDENT_AMBULATORY_CARE_PROVIDER_SITE_OTHER): Payer: Medicare Other | Admitting: Cardiology

## 2019-03-19 ENCOUNTER — Encounter: Payer: Self-pay | Admitting: Cardiology

## 2019-03-19 VITALS — BP 117/48 | HR 67 | Temp 96.9°F | Ht 71.5 in | Wt 184.0 lb

## 2019-03-19 DIAGNOSIS — I5022 Chronic systolic (congestive) heart failure: Secondary | ICD-10-CM

## 2019-03-19 DIAGNOSIS — I4819 Other persistent atrial fibrillation: Secondary | ICD-10-CM | POA: Diagnosis not present

## 2019-03-19 MED ORDER — EZETIMIBE 10 MG PO TABS: 10.0000 mg | ORAL_TABLET | Freq: Every day | ORAL | 3 refills | Status: DC

## 2019-03-19 MED ORDER — HYDRALAZINE HCL 25 MG PO TABS: 12.5000 mg | ORAL_TABLET | Freq: Three times a day (TID) | ORAL | 3 refills | Status: DC

## 2019-03-19 NOTE — Progress Notes (Signed)
Virtual Visit via Telephone Note   This visit type was conducted due to national recommendations for restrictions regarding the COVID-19 Pandemic (e.g. social distancing) in an effort to limit this patient's exposure and mitigate transmission in our community.  Due to his co-morbid illnesses, this patient is at least at moderate risk for complications without adequate follow up.  This format is felt to be most appropriate for this patient at this time.  The patient did not have access to video technology/had technical difficulties with video requiring transitioning to audio format only (telephone).  All issues noted in this document were discussed and addressed.  No physical exam could be performed with this format.  Please refer to the patient's chart for his  consent to telehealth for [facility].   Evaluation Performed:  Follow-up visit  Date:  03/19/2019   ID:  Gary, Odonnell 1932-02-03, MRN [id number]  Patient Location: Home Provider Location: Home  PCP:  Glenda Chroman, MD  Cardiologist:  Carlyle Dolly, MD  Electrophysiologist:  None   Chief Complaint:  1 month follow up  History of Present Illness:    Gary Odonnell is a 85 y.o. male seen today for follow up of the following medical problems. This is a focused visit on history of afib and chronic systolic HF   1. Afib - new diagnosis during 12/27/2018 ER visit. Eliquis started in clniic 12/29/17 - CHADS2Vasc score is (age x2, CHF, HTN, stroke x2, CAD) is 7   No recent palpitations. Compliant with meds. No bleeding on eliquis   2. CAD/ICM/Chronic systolic heart failure - history of prior CABG 2003 in [place]  - 09/2013 admission to [facility] with decompensated heart failure. Workup included an echo which showed an LVEF of 15%. He was transferred to [facility] for further management.  - cath 09/2013 showed LM 30%, LAD 100% proximal, LCX 50% ostial, RCA 100% mid with distal vessel filling with right to right  and left to right collaterals. LIMA-LAD patent, graft from [place] to OM1, OM2, PDA occluded. No PCI targets, recommendations for medical management. RHC mean PA 43, wedge 22, CI 1.74.  - repeat echo 04/2014 shows LVEF improved to 40-45%. - 01/2018 echo LVEF 35-40%, grade II diastolic dysfunction.   12/2018 admission with acute on chronic combined systolic/diastolic HF - 07/3817 echo LVEF 30-35% - discharge weight reportedly 207 lbs and appears inaccurate. Clinic weights from Dec and Feb 196 to 197 lbs. - reported home weights 180 lbs, he is 176 lbs by our scales.    - last visit due to weight loss and low bp's we lowered his lasix to 40mg  daily, take 60mg  if above 190 lbs. Weight mildly up from 180 at home to 184 lbs. Bp's improved today.    - no recent SOB or DOE. No recent edema - some dizziness at times.           The patient does not have symptoms concerning for COVID-19 infection (fever, chills, cough, or new shortness of breath).    Past Medical History:  Diagnosis Date  . Cancer ([place])    L lung  . Coronary artery disease   . Hypercholesteremia   . Hypertension   . PAD (peripheral artery disease) ([place])   . Pneumonia 09/2013  . Radiation april 2016  . Solitary pulmonary nodule on lung CT 12/13/13   11/15/14  . Stroke [facility]) 11/2008   Past Surgical History:  Procedure Laterality Date  . Angioplasty to Left  femoral artery    . CARPAL TUNNEL RELEASE Left 05/11/2014   Procedure: LEFT CARPAL TUNNEL RELEASE;  Surgeon: Carole Civil, MD;  Location: [facility] ORS;  Service: Orthopedics;  Laterality: Left;  . COLONOSCOPY N/A 01/24/2015   Procedure: COLONOSCOPY;  Surgeon: Danie Binder, MD;  Location: [facility] ENDO SUITE;  Service: Endoscopy;  Laterality: N/A;  130  . CORONARY ARTERY BYPASS GRAFT     x4  . FEMORAL-POPLITEAL BYPASS GRAFT Right 10/23/2015   Procedure: BYPASS GRAFT FEMORAL TO BELOW KNEE POPLITEAL ARTERY USING RIGHT NON-REVERSED GREATER SAPPHENOUS VEIN;  Surgeon:  [name], MD;  Location: [place];  Service: Vascular;  Laterality: Right;  . INTRAOPERATIVE ARTERIOGRAM Right 10/23/2015   Procedure: INTRA OPERATIVE ARTERIOGRAM - RIGHT LOWER LEG;  Surgeon: [name], MD;  Location: [place];  Service: Vascular;  Laterality: Right;  . KNEE ARTHROSCOPY Right   . LEFT AND RIGHT HEART CATHETERIZATION WITH CORONARY ANGIOGRAM N/A 09/29/2013   Procedure: LEFT AND RIGHT HEART CATHETERIZATION WITH CORONARY ANGIOGRAM;  Surgeon: Burnell Blanks, MD;  Location: [facility] CATH LAB;  Service: Cardiovascular;  Laterality: N/A;  . PERIPHERAL VASCULAR CATHETERIZATION N/A 04/21/2015   Procedure: Abdominal Aortogram w/Lower Extremity;  Surgeon: [name], MD;  Location: [place] CV LAB;  Service: Cardiovascular;  Laterality: N/A;  . Stent to right femoral artery    . TRIGGER FINGER RELEASE Bilateral   . VEIN HARVEST Right 10/23/2015   Procedure: Jay;  Surgeon: [name], MD;  Location: [place];  Service: Vascular;  Laterality: Right;     No outpatient medications have been marked as taking for the 03/19/19 encounter (Appointment) with Arnoldo Lenis, MD.     Allergies:   Patient has no known allergies.   Social History   Tobacco Use  . Smoking status: Former Smoker    Packs/day: 1.00    Years: 60.00    Pack years: 60.00    Types: Cigars    Start date: 11/21/1949    Last attempt to quit: 01/05/2019    Years since quitting: 0.2  . Smokeless tobacco: Never Used  Substance Use Topics  . Alcohol use: No    Alcohol/week: 0.0 standard drinks  . Drug use: No     Family Hx: The patient's family history includes Diabetes in his brother and mother; Heart attack in his father; Hyperlipidemia in his father. There is no history of Colon cancer.  ROS:   Please see the history of present illness.     All other systems reviewed and are negative.   Prior CV studies:   The following studies were reviewed today:   09/29/13 Cath Hemodynamic Findings: Ao: 162/70  LV: 163/19/29  RA: 12 RV: 66/7/16  PA: 67/26 (mean 43)  PCWP: 22  Fick Cardiac Output: 3.5 L/min  Fick Cardiac Index: 1.74 L/min/m2  Central Aortic Saturation: 91%  Pulmonary Artery Saturation: 50%  Angiographic Findings:  Left main: Distal 30% stenosis.  Left Anterior Descending Artery: 100% proximal occlusion. Proximal, mid and distal vessel fills from the patent IMA graft.  Circumflex Artery: Large caliber vessel with large intermediate Dacoda Finlay. Diffuse non-obstructive plaque in the large bifurcating intermediate Cinderella Christoffersen. The ostium of the Circumflex has 50% stenosis.  Right Coronary Artery: Large dominant vessel with 100% mid occlusion. The distal vessel fills right to right bridging collaterals and left to right collaterals.  Graft Anatomy:  LIMA to LAD is patent  Free Radial (? Y graft from LIMA) to  OM1, OM2, PDA is occluded.  Left Ventricular Angiogram: Deferred.  Impression:  1. Triple vessel CAD with patent LIMA to LAD, occluded RCA with good collateral filling, moderate disease in Circumflex  2. No focal targets for PCI  3. Ischemic cardiomyopathy with acute systolic CHF   01/0/93 Echo LVEF 15-20%, moderate LVH, severe diffuse hypokinesis, restrictive diastolic dysfunction, multiple WMAs, moderate MR, severe RV dysfunction RV TAPSE 0.7 cm, PASP 61 mmHg.   12/2013 Lab TC 175 TG 111 LDL 116 HDL 37  TSH 7.7 HgbA1c 6.6 Na 143 K 4.5 Cr 1.22 GFR56   04/2014 Echo Study Conclusions  - Left ventricle: The cavity size was normal. Wall thickness was increased in a pattern of mild LVH. Systolic function was mildly to moderately reduced. The estimated ejection fraction was in the range of 40% to 45%. There is diastolic dysfunction, indeterminant grade. - Regional wall motion abnormality: Hypokinesis of the mid anterior and basal-mid anteroseptal myocardium. - Aortic valve: Moderately calcified  annulus. Trileaflet; moderately thickened leaflets. Valve area (VTI): 2.48 cm^2. Valve area (Vmax): 2.23 cm^2. - Mitral valve: Moderately calcified annulus. Mildly thickened leaflets . - Left atrium: The atrium was moderately dilated. - Right atrium: The atrium was mildly dilated. - Technically adequate study.   01/2015 Carotid US bilaterall 1-39%    01/2018 echo Study Conclusions  - Left ventricle: The cavity size was normal. Wall thickness was increased in a pattern of moderate LVH. Systolic function was moderately reduced. The estimated ejection fraction was in the range of 35% to 40%. Diffuse hypokinesis. Features are consistent with a pseudonormal left ventricular filling pattern, with concomitant abnormal relaxation and increased filling pressure (grade 2 diastolic dysfunction). Doppler parameters are consistent with high ventricular filling pressure. - Regional wall motion abnormality: Moderate hypokinesis of the basal-mid anteroseptal, mid inferoseptal, and mid inferior myocardium. - Aortic valve: Moderately calcified annulus. Mildly thickened, mildly calcified leaflets. There appears to be at least mild (if not mild to moderate) calcific aortic valvular stenosis. I suspect peak velocity and mean gradient is underestimated due to depressed LV function. Peak velocity (S): 159 cm/s. Mean gradient (S): 6 mm Hg. Valve area (VTI): 1.66 cm^2. Valve area (Vmax): 1.73 cm^2. Valve area (Vmean): 1.52 cm^2. - Mitral valve: Calcified annulus. Restricted leaflet motion due to depressed LV function. There was mild regurgitation. Valve area by continuity equation (using LVOT flow): 1.21 cm^2. - Left atrium: The atrium was mildly dilated. - Right ventricle: Systolic function was moderately reduced. - Tricuspid valve: There was mild regurgitation  Labs/Other Tests and Data Reviewed:    EKG:  na  Recent Labs: 06/04/2018: TSH 9.125 01/10/2019:  B Natriuretic Peptide 2,650.0 01/11/2019: ALT 39 01/18/2019: Hemoglobin 11.6; Platelets 105 02/05/2019: BUN 23; Creatinine, Ser 1.36; Magnesium 2.2; Potassium 4.7; Sodium 139   Recent Lipid Panel Lab Results  Component Value Date/Time   CHOL 195 06/04/2018 11:32 AM   TRIG 116 06/04/2018 11:32 AM   HDL 45 06/04/2018 11:32 AM   CHOLHDL 4.3 06/04/2018 11:32 AM   LDLCALC 127 (H) 06/04/2018 11:32 AM    Wt Readings from Last 3 Encounters:  02/04/19 176 lb 9.6 oz (80.1 kg)  01/18/19 207 lb 0.2 oz (93.9 kg)  01/08/19 212 lb 12.8 oz (96.5 kg)     Objective:    Vital Signs:  p 67 bp 117/48 Wt 148 lbs  Normal affect. Normal speech pattern and tone. Comfortable, no distress. No audible signs of SOB or wheezing.   ASSESSMENT & PLAN:  1.Persistent Afib - new diagnosis during recent ER visit.  Last EKG today shows rate controlled afib - we were considering DCCV in setting of newly diagnosed afib, however due to COVID-19 hold off at this time, he has been rate controlled and asymptomatic. Give age and fragility possible DCCV will be an ongoing discussion as to pursue at some point.  - continue current meds  2. Chronic systolic HF - no symptoms. After lowering lasix dose mild uptred in weight, bp improving, had been low. Some dizziness at times, we will lower hydral to 12.5mg  tid    F/u 2 months   COVID-19 Education: The signs and symptoms of COVID-19 were discussed with the patient and how to seek care for testing (follow up with PCP or arrange E-visit).  The importance of social distancing was discussed today.  Time:   Today, I have spent 18 minutes with the patient with telehealth technology discussing the above problems.     Medication Adjustments/Labs and Tests Ordered: Current medicines are reviewed at length with the patient today.  Concerns regarding medicines are outlined above.   Tests Ordered: No orders of the defined types were placed in this encounter.    Medication Changes: No orders of the defined types were placed in this encounter.   Disposition:  Follow up 2 months  Signed, Carlyle Dolly, MD  03/19/2019 8:58 AM     [facility]

## 2019-03-19 NOTE — Progress Notes (Signed)
Medication Instructions:  DECREASE HYDRALAZINE TO 12.5 MG THREE TIMES DAILY   Labwork: NONE  Testing/Procedures: NONE  Follow-Up: Your physician recommends that you schedule a follow-up appointment in: 2 MONTHS    Any Other Special Instructions Will Be Listed Below (If Applicable).     If you need a refill on your cardiac medications before your next appointment, please call your pharmacy.

## 2019-03-29 ENCOUNTER — Telehealth: Payer: Self-pay | Admitting: Cardiology

## 2019-03-29 NOTE — Telephone Encounter (Signed)
Ms Renaldo says pt BP 104/48 (doesn't know HR) yesterday with dizziness - hasn't checked it today but denies any symptoms at this time. Pt has been taking hydralazine 12.5 mg bid. Will forward to covering provider with Dr Harl Bowie out of office

## 2019-03-29 NOTE — Telephone Encounter (Signed)
Patient's wife called to state that patient was just started on Hydralazine by Dr.Branch and that patient's BP is now reading 104/48. States that she feels this is too low. Would like to speak with nurse. / tg

## 2019-03-29 NOTE — Telephone Encounter (Signed)
Gary Odonnell voiced understanding

## 2019-03-29 NOTE — Telephone Encounter (Signed)
Can hold hydralazine for now and discuss again with Dr. Harl Bowie when he returns.

## 2019-04-13 DIAGNOSIS — R338 Other retention of urine: Secondary | ICD-10-CM | POA: Diagnosis not present

## 2019-05-12 ENCOUNTER — Telehealth: Payer: Self-pay | Admitting: Cardiology

## 2019-05-12 NOTE — Telephone Encounter (Signed)
Patient called stating that he needs a refill on Entresto. [facility], [place], [place]

## 2019-05-12 NOTE — Telephone Encounter (Signed)
Is this pt still supposed to be on Entresto 49/51mg  bid? Was on his list on LOV in March - not currently on list and don't see where it was discontinued

## 2019-05-12 NOTE — Telephone Encounter (Signed)
Spoke with wife Luellen Pucker) - needs refill on Entresto 49/51mg .  Please advise if this is correct dose as this medication was not on his med list.  His bottle has Irwin Brakeman, MD on it which is hosp doc from visit in February.  Medication was not mentioned in last dictation from telehealth visit on 03/19/19.

## 2019-05-13 MED ORDER — SACUBITRIL-VALSARTAN 49-51 MG PO TABS: 1.0000 | ORAL_TABLET | Freq: Two times a day (BID) | ORAL | 3 refills | Status: DC

## 2019-05-13 NOTE — Telephone Encounter (Signed)
Was on his 01/2019 med rec during that appt, not clear why it was not on his med rec at more recent appt. Ok to continue, verify that he had not stopped it at any point recently   Zandra Abts MD

## 2019-05-13 NOTE — Telephone Encounter (Signed)
Wife Luellen Pucker) notified.  Entresto 49/51mg  twice a day - sent to pharm now.

## 2019-05-26 ENCOUNTER — Telehealth (INDEPENDENT_AMBULATORY_CARE_PROVIDER_SITE_OTHER): Payer: Medicare Other | Admitting: Cardiology

## 2019-05-26 ENCOUNTER — Encounter: Payer: Self-pay | Admitting: Cardiology

## 2019-05-26 VITALS — BP 132/56 | HR 60 | Temp 97.9°F | Ht 71.5 in | Wt 188.0 lb

## 2019-05-26 DIAGNOSIS — I5022 Chronic systolic (congestive) heart failure: Secondary | ICD-10-CM | POA: Diagnosis not present

## 2019-05-26 NOTE — Patient Instructions (Signed)
Your physician recommends that you schedule a follow-up appointment in: 2 [place]  Your physician recommends that you continue on your current medications as directed. Please refer to the Current Medication list given to you today.  Thank you for choosing [facility]!!

## 2019-05-26 NOTE — Progress Notes (Signed)
Virtual Visit via Telephone Note   This visit type was conducted due to national recommendations for restrictions regarding the COVID-19 Pandemic (e.g. social distancing) in an effort to limit this patient's exposure and mitigate transmission in our community.  Due to his co-morbid illnesses, this patient is at least at moderate risk for complications without adequate follow up.  This format is felt to be most appropriate for this patient at this time.  The patient did not have access to video technology/had technical difficulties with video requiring transitioning to audio format only (telephone).  All issues noted in this document were discussed and addressed.  No physical exam could be performed with this format.  Please refer to the patient's chart for his  consent to telehealth for [facility].   Date:  05/26/2019   ID:  [name], DOB 1931/12/13, MRN [id number]  Patient Location: Home Provider Location: Office  PCP:  Glenda Chroman, MD  Cardiologist:  Carlyle Dolly, MD  Electrophysiologist:  None   Evaluation Performed:  Follow-Up Visit  Chief Complaint:  CHF  History of Present Illness:    Gary Odonnell is a 85 y.o. male seen for a focused visit for his history of chronic systolic HF, for more detailed history please refer to prior clinic notes    1. CAD/ICM/Chronic systolic heart failure - history of prior CABG 2003 in [place]  - 09/2013 admission to [facility] with decompensated heart failure. Workup included an echo which showed an LVEF of 15%. He was transferred to [facility] for further management.  - cath 09/2013 showed LM 30%, LAD 100% proximal, LCX 50% ostial, RCA 100% mid with distal vessel filling with right to right and left to right collaterals. LIMA-LAD patent, graft from [place] to OM1, OM2, PDA occluded. No PCI targets, recommendations for medical management. RHC mean PA 43, wedge 22, CI 1.74.  - repeat echo 04/2014 shows LVEF improved to  40-45%. - 01/2018 echo LVEF 35-40%, grade II diastolic dysfunction.   12/2018 admission with acute on chronic combined systolic/diastolic HF - 0/2585 echo LVEF 30-35% - discharge weight reportedly 207 lbsand appears inaccurate. Clinic weights from Dec and Feb 196 to 197 lbs. - reported home weights 180 lbs, he is 176 lbs by our scales.    - weights up from 184 lbs to 188 lbs. We had previously lowered his diuretic due to low bp's and significant weight loss as low as 180 lbs.  - no recent edema. No SOB or DOE. Dizzienss has improved.         The patient does not have symptoms concerning for COVID-19 infection (fever, chills, cough, or new shortness of breath).    Past Medical History:  Diagnosis Date   Cancer ([place])    L lung   Coronary artery disease    Hypercholesteremia    Hypertension    PAD (peripheral artery disease) ([place])    Pneumonia 09/2013   Radiation april 2016   Solitary pulmonary nodule on lung CT 12/13/13   11/15/14   Stroke ([place]) 11/2008   Past Surgical History:  Procedure Laterality Date   Angioplasty to Left femoral artery     CARPAL TUNNEL RELEASE Left 05/11/2014   Procedure: LEFT CARPAL TUNNEL RELEASE;  Surgeon: Carole Civil, MD;  Location: [facility] ORS;  Service: Orthopedics;  Laterality: Left;   COLONOSCOPY N/A 01/24/2015   Procedure: COLONOSCOPY;  Surgeon: Danie Binder, MD;  Location: [facility] ENDO SUITE;  Service: Endoscopy;  Laterality: N/A;  130   CORONARY ARTERY BYPASS GRAFT     x4   FEMORAL-POPLITEAL BYPASS GRAFT Right 10/23/2015   Procedure: BYPASS GRAFT FEMORAL TO BELOW KNEE POPLITEAL ARTERY USING RIGHT NON-REVERSED GREATER SAPPHENOUS VEIN;  Surgeon: [name], MD;  Location: [facility];  Service: Vascular;  Laterality: Right;   INTRAOPERATIVE ARTERIOGRAM Right 10/23/2015   Procedure: INTRA OPERATIVE ARTERIOGRAM - RIGHT LOWER LEG;  Surgeon: [name], MD;  Location: [facility];  Service: Vascular;  Laterality: Right;    KNEE ARTHROSCOPY Right    LEFT AND RIGHT HEART CATHETERIZATION WITH CORONARY ANGIOGRAM N/A 09/29/2013   Procedure: LEFT AND RIGHT HEART CATHETERIZATION WITH CORONARY ANGIOGRAM;  Surgeon: Burnell Blanks, MD;  Location: [facility] CATH LAB;  Service: Cardiovascular;  Laterality: N/A;   PERIPHERAL VASCULAR CATHETERIZATION N/A 04/21/2015   Procedure: Abdominal Aortogram w/Lower Extremity;  Surgeon: [name], MD;  Location: [facility] CV LAB;  Service: Cardiovascular;  Laterality: N/A;   Stent to right femoral artery     TRIGGER FINGER RELEASE Bilateral    VEIN HARVEST Right 10/23/2015   Procedure: [place];  Surgeon: [name], MD;  Location: [facility];  Service: Vascular;  Laterality: Right;     No outpatient medications have been marked as taking for the 05/26/19 encounter (Appointment) with Arnoldo Lenis, MD.     Allergies:   Patient has no known allergies.   Social History   Tobacco Use   Smoking status: Former Smoker    Packs/day: 1.00    Years: 60.00    Pack years: 60.00    Types: Cigars    Start date: 11/21/1949    Quit date: 01/05/2019    Years since quitting: 0.3   Smokeless tobacco: Never Used  Substance Use Topics   Alcohol use: No    Alcohol/week: 0.0 standard drinks   Drug use: No     Family Hx: The patient's family history includes Diabetes in his brother and mother; Heart attack in his father; Hyperlipidemia in his father. There is no history of Colon cancer.  ROS:   Please see the history of present illness.     All other systems reviewed and are negative.   Prior CV studies:   The following studies were reviewed today:  09/29/13 Cath Hemodynamic Findings: Ao: 162/70  LV: 163/19/29  RA: 12 RV: 66/7/16  PA: 67/26 (mean 43)  PCWP: 22  Fick Cardiac Output: 3.5 L/min  Fick Cardiac Index: 1.74 L/min/m2  Central Aortic Saturation: 91%  Pulmonary Artery Saturation: 50%  Angiographic  Findings:  Left main: Distal 30% stenosis.  Left Anterior Descending Artery: 100% proximal occlusion. Proximal, mid and distal vessel fills from the patent IMA graft.  Circumflex Artery: Large caliber vessel with large intermediate Quasim Doyon. Diffuse non-obstructive plaque in the large bifurcating intermediate [place] Lust. The ostium of the Circumflex has 50% stenosis.  Right Coronary Artery: Large dominant vessel with 100% mid occlusion. The distal vessel fills right to right bridging collaterals and left to right collaterals.  Graft Anatomy:  LIMA to LAD is patent  Free Radial (? Y graft from LIMA) to OM1, OM2, PDA is occluded.  Left Ventricular Angiogram: Deferred.  Impression:  1. Triple vessel CAD with patent LIMA to LAD, occluded RCA with good collateral filling, moderate disease in Circumflex  2. No focal targets for PCI  3. Ischemic cardiomyopathy with acute systolic CHF   88/5/02 Echo LVEF 15-20%, moderate LVH, severe diffuse hypokinesis, restrictive diastolic dysfunction, multiple WMAs,  moderate MR, severe RV dysfunction RV TAPSE 0.7 cm, PASP 61 mmHg.   12/2013 Lab TC 175 TG 111 LDL 116 HDL 37  TSH 7.7 HgbA1c 6.6 Na 143 K 4.5 Cr 1.22 GFR56   04/2014 Echo Study Conclusions  - Left ventricle: The cavity size was normal. Wall thickness was increased in a pattern of mild LVH. Systolic function was mildly to moderately reduced. The estimated ejection fraction was in the range of 40% to 45%. There is diastolic dysfunction, indeterminant grade. - Regional wall motion abnormality: Hypokinesis of the mid anterior and basal-mid anteroseptal myocardium. - Aortic valve: Moderately calcified annulus. Trileaflet; moderately thickened leaflets. Valve area (VTI): 2.48 cm^2. Valve area (Vmax): 2.23 cm^2. - Mitral valve: Moderately calcified annulus. Mildly thickened leaflets . - Left atrium: The atrium was moderately dilated. - Right atrium: The atrium was mildly dilated. -  Technically adequate study.   01/2015 Carotid US bilaterall 1-39%    01/2018 echo Study Conclusions  - Left ventricle: The cavity size was normal. Wall thickness was increased in a pattern of moderate LVH. Systolic function was moderately reduced. The estimated ejection fraction was in the range of 35% to 40%. Diffuse hypokinesis. Features are consistent with a pseudonormal left ventricular filling pattern, with concomitant abnormal relaxation and increased filling pressure (grade 2 diastolic dysfunction). Doppler parameters are consistent with high ventricular filling pressure. - Regional wall motion abnormality: Moderate hypokinesis of the basal-mid anteroseptal, mid inferoseptal, and mid inferior myocardium. - Aortic valve: Moderately calcified annulus. Mildly thickened, mildly calcified leaflets. There appears to be at least mild (if not mild to moderate) calcific aortic valvular stenosis. I suspect peak velocity and mean gradient is underestimated due to depressed LV function. Peak velocity (S): 159 cm/s. Mean gradient (S): 6 mm Hg. Valve area (VTI): 1.66 cm^2. Valve area (Vmax): 1.73 cm^2. Valve area (Vmean): 1.52 cm^2. - Mitral valve: Calcified annulus. Restricted leaflet motion due to depressed LV function. There was mild regurgitation. Valve area by continuity equation (using LVOT flow): 1.21 cm^2. - Left atrium: The atrium was mildly dilated. - Right ventricle: Systolic function was moderately reduced. - Tricuspid valve: There was mild regurgitation  Labs/Other Tests and Data Reviewed:    EKG:  No ECG reviewed.  Recent Labs: 06/04/2018: TSH 9.125 01/10/2019: B Natriuretic Peptide 2,650.0 01/11/2019: ALT 39 01/18/2019: Hemoglobin 11.6; Platelets 105 02/05/2019: BUN 23; Creatinine, Ser 1.36; Magnesium 2.2; Potassium 4.7; Sodium 139   Recent Lipid Panel Lab Results  Component Value Date/Time   CHOL 195 06/04/2018 11:32 AM    TRIG 116 06/04/2018 11:32 AM   HDL 45 06/04/2018 11:32 AM   CHOLHDL 4.3 06/04/2018 11:32 AM   LDLCALC 127 (H) 06/04/2018 11:32 AM    Wt Readings from Last 3 Encounters:  03/19/19 184 lb (83.5 kg)  02/04/19 176 lb 9.6 oz (80.1 kg)  01/18/19 207 lb 0.2 oz (93.9 kg)     Objective:    Vital Signs:   Today's Vitals   05/26/19 1035  BP: (!) 132/56  Pulse: 60  Temp: 97.9 F (36.6 C)  SpO2: 97%  Weight: 188 lb (85.3 kg)  Height: 5' 11.5" (1.816 m)   Body mass index is 25.86 kg/m. Normal affect. Normal speech pattern and tone. No audible signs of SOB or wheezing. Comfortable, no apparent distress  ASSESSMENT & PLAN:    1. Chronic systolic HF - no current symptoms. Previously was overdiusred with weight down to 180lbs and hypotensive. With lowered lasix dosing weight trending up to 188 lbs. No  reported symptoms or edema. Lasix currently 40mg  daily, take 60mg  if weight 190lbs or high, continue current regimen.     COVID-19 Education: The signs and symptoms of COVID-19 were discussed with the patient and how to seek care for testing (follow up with PCP or arrange E-visit).  The importance of social distancing was discussed today.  Time:   Today, I have spent 10 minutes with the patient with telehealth technology discussing the above problems.     Medication Adjustments/Labs and Tests Ordered: Current medicines are reviewed at length with the patient today.  Concerns regarding medicines are outlined above.   Tests Ordered: No orders of the defined types were placed in this encounter.   Medication Changes: No orders of the defined types were placed in this encounter.   Follow Up:  Virtual Visit in 2 month(s)  Signed, Carlyle Dolly, MD  05/26/2019 9:16 AM    [place]

## 2019-06-21 ENCOUNTER — Other Ambulatory Visit: Payer: Self-pay | Admitting: Cardiology

## 2019-06-21 MED ORDER — EZETIMIBE 10 MG PO TABS: 10.0000 mg | ORAL_TABLET | Freq: Every day | ORAL | 3 refills | Status: DC

## 2019-06-21 NOTE — Telephone Encounter (Signed)
°*  STAT* If patient is at the pharmacy, call can be transferred to refill team.   1. Which medications need to be refilled?  levalbuterol (XOPENEX) 0.63 MG/3ML nebulizer solution ezetimibe (ZETIA) 10 MG tablet    2. Which pharmacy/location (including street and city if local pharmacy) is medication to be sent to? [facility]  3. Do they need a 30 day or 90 day supply?

## 2019-06-21 NOTE — Telephone Encounter (Signed)
Xopenex refill must come from PCP-wife informed.

## 2019-06-29 ENCOUNTER — Telehealth: Payer: Self-pay | Admitting: *Deleted

## 2019-06-29 MED ORDER — FUROSEMIDE 40 MG PO TABS: ORAL_TABLET | ORAL | 1 refills | Status: DC

## 2019-06-29 NOTE — Telephone Encounter (Signed)
Take 60mg  x 3 days, then resume his prior 40mg  daily. Update [place] on Friday   J Jessika Rothery MD

## 2019-06-29 NOTE — Telephone Encounter (Signed)
Pt wife requested refill on lasix c/o pt fluid in stomach for the last 2 weeks, not urinating like normal - SOB when walking around - doesn't know how much weight gain or if any - has taken lasix today and yesterday 40 mg - pt was not taking lasix daily  - aware that rx was for 40 mg daily and could give extra 1/2 tablet for swelling or weight gain as per instructions - wife voiced understanding - will forward to provider

## 2019-06-29 NOTE — Telephone Encounter (Signed)
Pt wife voiced understanding - will update [place] on Friday

## 2019-07-05 ENCOUNTER — Telehealth: Payer: Self-pay | Admitting: Cardiology

## 2019-07-05 NOTE — Telephone Encounter (Signed)
Calling with update since starting medication

## 2019-07-05 NOTE — Telephone Encounter (Signed)
Weight 189lbs today BP 139/47 - since increase of lasix - has been taking 60 mg daily - says he is urinating more frequently and feels much better

## 2019-07-06 NOTE — Addendum Note (Signed)
Addended by: Julian Hy T on: 07/06/2019 11:37 AM   Modules accepted: Orders

## 2019-07-06 NOTE — Telephone Encounter (Signed)
Updated medication list

## 2019-07-06 NOTE — Telephone Encounter (Signed)
Not much change in weight, if he is feeling better can continue current dose.   Zandra Abts MD

## 2019-07-23 ENCOUNTER — Telehealth: Payer: Self-pay | Admitting: Cardiology

## 2019-07-23 NOTE — Telephone Encounter (Signed)
Virtual Visit Pre-Appointment Phone Call  "(Name), I am calling you today to discuss your upcoming appointment. We are currently trying to limit exposure to the virus that causes COVID-19 by seeing patients at home rather than in the office."  1. "What is the BEST phone number to call the day of the visit?" -[phone] 2.   3. Do you have or have access to (through a family member/friend) a smartphone with video capability that we can use for your visit?" a. If yes - list this number in appt notes as cell (if different from BEST phone #) and list the appointment type as a VIDEO visit in appointment notes b. If no - list the appointment type as a PHONE visit in appointment notes  4. Confirm consent - "In the setting of the current Covid19 crisis, you are scheduled for a (phone or video) visit with your provider on (date) at (time).  Just as we do with many in-office visits, in order for you to participate in this visit, we must obtain consent.  If you'd like, I can send this to your mychart (if signed up) or email for you to review.  Otherwise, I can obtain your verbal consent now.  All virtual visits are billed to your insurance company just like a normal visit would be.  By agreeing to a virtual visit, we'd like you to understand that the technology does not allow for your provider to perform an examination, and thus may limit your provider's ability to fully assess your condition. If your provider identifies any concerns that need to be evaluated in person, we will make arrangements to do so.  Finally, though the technology is pretty good, we cannot assure that it will always work on either your or our end, and in the setting of a video visit, we may have to convert it to a phone-only visit.  In either situation, we cannot ensure that we have a secure connection.  Are you willing to proceed?" STAFF: Did the patient verbally acknowledge consent to telehealth visit? Document YES/NO here: yes    5.   6. Advise patient to be prepared - "Two hours prior to your appointment, go ahead and check your blood pressure, pulse, oxygen saturation, and your weight (if you have the equipment to check those) and write them all down. When your visit starts, your provider will ask you for this information. If you have an Apple Watch or Kardia device, please plan to have heart rate information ready on the day of your appointment. Please have a pen and paper handy nearby the day of the visit as well."  7. Give patient instructions for MyChart download to smartphone OR [email or web address] as below if video visit (depending on what platform provider is using)  8. Inform patient they will receive a phone call 15 minutes prior to their appointment time (may be from unknown caller ID) so they should be prepared to answer    TELEPHONE CALL NOTE  ADHAM JOHNSON has been deemed a candidate for a follow-up tele-health visit to limit community exposure during the Covid-19 pandemic. I spoke with the patient via phone to ensure availability of phone/video source, confirm preferred email & phone number, and discuss instructions and expectations.  I reminded Gary Odonnell to be prepared with any vital sign and/or heart rhythm information that could potentially be obtained via home monitoring, at the time of his visit. I reminded Gary Odonnell to expect a phone call prior  to his visit.  Lynnda Child Slaughter 07/23/2019 2:40 PM   INSTRUCTIONS FOR DOWNLOADING THE MYCHART APP TO SMARTPHONE  - The patient must first make sure to have activated MyChart and know their login information - If Apple, go to [employer] and type in MyChart in the search bar and download the app. If Android, ask patient to go to [employer] and type in [place] in the search bar and download the app. The app is free but as with any other app downloads, their phone may require them to verify saved payment information or Apple/Android password.  -  The patient will need to then log into the app with their MyChart username and password, and select [facility] as their healthcare provider to link the account. When it is time for your visit, go to the MyChart app, find appointments, and click Begin Video Visit. Be sure to Select Allow for your device to access the Microphone and Camera for your visit. You will then be connected, and your provider will be with you shortly.  **If they have any issues connecting, or need assistance please contact MyChart service desk [phone])**  **If using a computer, in order to ensure the best quality for their visit they will need to use either of the following Internet Browsers: [employer], or Google Chrome**  IF USING DOXIMITY or DOXY.ME - The patient will receive a link just prior to their visit by text.     FULL LENGTH CONSENT FOR TELE-HEALTH VISIT   I hereby voluntarily request, consent and authorize [facility] and its employed or contracted physicians, physician assistants, nurse practitioners or other licensed health care professionals (the Practitioner), to provide me with telemedicine health care services (the Services") as deemed necessary by the treating Practitioner. I acknowledge and consent to receive the Services by the Practitioner via telemedicine. I understand that the telemedicine visit will involve communicating with the Practitioner through live audiovisual communication technology and the disclosure of certain medical information by electronic transmission. I acknowledge that I have been given the opportunity to request an in-person assessment or other available alternative prior to the telemedicine visit and am voluntarily participating in the telemedicine visit.  I understand that I have the right to withhold or withdraw my consent to the use of telemedicine in the course of my care at any time, without affecting my right to future care or treatment, and that the  Practitioner or I may terminate the telemedicine visit at any time. I understand that I have the right to inspect all information obtained and/or recorded in the course of the telemedicine visit and may receive copies of available information for a reasonable fee.  I understand that some of the potential risks of receiving the Services via telemedicine include:   Delay or interruption in medical evaluation due to technological equipment failure or disruption;  Information transmitted may not be sufficient (e.g. poor resolution of images) to allow for appropriate medical decision making by the Practitioner; and/or   In rare instances, security protocols could fail, causing a breach of personal health information.  Furthermore, I acknowledge that it is my responsibility to provide information about my medical history, conditions and care that is complete and accurate to the best of my ability. I acknowledge that Practitioner's advice, recommendations, and/or decision may be based on factors not within their control, such as incomplete or inaccurate data provided by me or distortions of diagnostic images or specimens that may result from electronic transmissions.  I understand that the practice of medicine is not an exact science and that Practitioner makes no warranties or guarantees regarding treatment outcomes. I acknowledge that I will receive a copy of this consent concurrently upon execution via email to the email address I last provided but may also request a printed copy by calling the office of [facility].    I understand that my insurance will be billed for this visit.   I have read or had this consent read to me.  I understand the contents of this consent, which adequately explains the benefits and risks of the Services being provided via telemedicine.   I have been provided ample opportunity to ask questions regarding this consent and the Services and have had my questions answered to my  satisfaction.  I give my informed consent for the services to be provided through the use of telemedicine in my medical care  By participating in this telemedicine visit I agree to the above.

## 2019-07-28 ENCOUNTER — Telehealth (INDEPENDENT_AMBULATORY_CARE_PROVIDER_SITE_OTHER): Payer: Medicare Other | Admitting: Cardiology

## 2019-07-28 ENCOUNTER — Encounter: Payer: Self-pay | Admitting: Cardiology

## 2019-07-28 VITALS — BP 150/63 | HR 59 | Temp 97.3°F | Ht 71.5 in | Wt 184.0 lb

## 2019-07-28 DIAGNOSIS — I5042 Chronic combined systolic (congestive) and diastolic (congestive) heart failure: Secondary | ICD-10-CM

## 2019-07-28 DIAGNOSIS — I10 Essential (primary) hypertension: Secondary | ICD-10-CM | POA: Diagnosis not present

## 2019-07-28 MED ORDER — FUROSEMIDE 40 MG PO TABS: ORAL_TABLET | ORAL | Status: DC

## 2019-07-28 NOTE — Progress Notes (Signed)
Virtual Visit via Telephone Note   This visit type was conducted due to national recommendations for restrictions regarding the COVID-19 Pandemic (e.g. social distancing) in an effort to limit this patient's exposure and mitigate transmission in our community.  Due to his co-morbid illnesses, this patient is at least at moderate risk for complications without adequate follow up.  This format is felt to be most appropriate for this patient at this time.  The patient did not have access to video technology/had technical difficulties with video requiring transitioning to audio format only (telephone).  All issues noted in this document were discussed and addressed.  No physical exam could be performed with this format.  Please refer to the patient's chart for his  consent to telehealth for [facility].   Date:  07/28/2019   ID:  [name], DOB Jul 04, 1932, MRN [id number]  Patient Location: Home Provider Location: Office  PCP:  Glenda Chroman, MD  Cardiologist:  Carlyle Dolly, MD  Electrophysiologist:  None   Evaluation Performed:  Follow-Up Visit  Chief Complaint:  Follow up  History of Present Illness:    Gary Odonnell is a 85 y.o. male seen today for follow up of the following medical problems. This is a focused visit on recent issues with acute on chronic systolic HF.   1. CAD/ICM/Chronic systolic heart failure - history of prior CABG 2003 in [place]  - 09/2013 admission to [facility] with decompensated heart failure. Workup included an echo which showed an LVEF of 15%. He was transferred to [facility] for further management.  - cath 09/2013 showed LM 30%, LAD 100% proximal, LCX 50% ostial, RCA 100% mid with distal vessel filling with right to right and left to right collaterals. LIMA-LAD patent, graft from [place] to OM1, OM2, PDA occluded. No PCI targets, recommendations for medical management. RHC mean PA 43, wedge 22, CI 1.74.  - repeat echo 04/2014 shows LVEF improved  to 40-45%. - 01/2018 echo LVEF 35-40%, grade II diastolic dysfunction.    - goal weight is around mid 180s. Got down to 180 before and was overdiuresed and hypotensive, around 190s he is volume overloaded and symptomatic.  - recetn abdmoinal distenstion, weight went up to 189 lbs. Lasix increased to 60mg  daily.  - weight down to 184 lbs. Swelling has resolved. No SOB or DOE.      The patient does not have symptoms concerning for COVID-19 infection (fever, chills, cough, or new shortness of breath).    Past Medical History:  Diagnosis Date  . Cancer ([place])    L lung  . Coronary artery disease   . Hypercholesteremia   . Hypertension   . PAD (peripheral artery disease) ([place])   . Pneumonia 09/2013  . Radiation april 2016  . Solitary pulmonary nodule on lung CT 12/13/13   11/15/14  . Stroke [facility]) 11/2008   Past Surgical History:  Procedure Laterality Date  . Angioplasty to Left femoral artery    . CARPAL TUNNEL RELEASE Left 05/11/2014   Procedure: LEFT CARPAL TUNNEL RELEASE;  Surgeon: Carole Civil, MD;  Location: [facility] ORS;  Service: Orthopedics;  Laterality: Left;  . COLONOSCOPY N/A 01/24/2015   Procedure: COLONOSCOPY;  Surgeon: Danie Binder, MD;  Location: [facility] ENDO SUITE;  Service: Endoscopy;  Laterality: N/A;  130  . CORONARY ARTERY BYPASS GRAFT     x4  . FEMORAL-POPLITEAL BYPASS GRAFT Right 10/23/2015   Procedure: BYPASS GRAFT FEMORAL TO BELOW KNEE POPLITEAL ARTERY USING RIGHT  NON-REVERSED GREATER SAPPHENOUS VEIN;  Surgeon: [name], MD;  Location: [place];  Service: Vascular;  Laterality: Right;  . INTRAOPERATIVE ARTERIOGRAM Right 10/23/2015   Procedure: INTRA OPERATIVE ARTERIOGRAM - RIGHT LOWER LEG;  Surgeon: [name], MD;  Location: [place];  Service: Vascular;  Laterality: Right;  . KNEE ARTHROSCOPY Right   . LEFT AND RIGHT HEART CATHETERIZATION WITH CORONARY ANGIOGRAM N/A 09/29/2013   Procedure: LEFT AND RIGHT HEART CATHETERIZATION WITH CORONARY ANGIOGRAM;   Surgeon: Burnell Blanks, MD;  Location: [facility] CATH LAB;  Service: Cardiovascular;  Laterality: N/A;  . PERIPHERAL VASCULAR CATHETERIZATION N/A 04/21/2015   Procedure: Abdominal Aortogram w/Lower Extremity;  Surgeon: [name], MD;  Location: [place] CV LAB;  Service: Cardiovascular;  Laterality: N/A;  . Stent to right femoral artery    . TRIGGER FINGER RELEASE Bilateral   . VEIN HARVEST Right 10/23/2015   Procedure: [place];  Surgeon: [name], MD;  Location: [place];  Service: Vascular;  Laterality: Right;     No outpatient medications have been marked as taking for the 07/28/19 encounter (Appointment) with Arnoldo Lenis, MD.     Allergies:   Patient has no known allergies.   Social History   Tobacco Use  . Smoking status: Current Every Day Smoker    Packs/day: 1.00    Years: 60.00    Pack years: 60.00    Types: Cigars, Cigarettes    Start date: 11/21/1949    Last attempt to quit: 01/05/2019    Years since quitting: 0.5  . Smokeless tobacco: Never Used  Substance Use Topics  . Alcohol use: No    Alcohol/week: 0.0 standard drinks  . Drug use: No     Family Hx: The patient's family history includes Diabetes in his brother and mother; Heart attack in his father; Hyperlipidemia in his father. There is no history of Colon cancer.  ROS:   Please see the history of present illness.     All other systems reviewed and are negative.   Prior CV studies:   The following studies were reviewed today:  09/29/13 Cath Hemodynamic Findings: Ao: 162/70  LV: 163/19/29  RA: 12 RV: 66/7/16  PA: 67/26 (mean 43)  PCWP: 22  Fick Cardiac Output: 3.5 L/min  Fick Cardiac Index: 1.74 L/min/m2  Central Aortic Saturation: 91%  Pulmonary Artery Saturation: 50%  Angiographic Findings:  Left main: Distal 30% stenosis.  Left Anterior Descending Artery: 100% proximal occlusion. Proximal, mid and distal vessel fills from the  patent IMA graft.  Circumflex Artery: Large caliber vessel with large intermediate branch. Diffuse non-obstructive plaque in the large bifurcating intermediate branch. The ostium of the Circumflex has 50% stenosis.  Right Coronary Artery: Large dominant vessel with 100% mid occlusion. The distal vessel fills right to right bridging collaterals and left to right collaterals.  Graft Anatomy:  LIMA to LAD is patent  Free Radial (? Y graft from LIMA) to OM1, OM2, PDA is occluded.  Left Ventricular Angiogram: Deferred.  Impression:  1. Triple vessel CAD with patent LIMA to LAD, occluded RCA with good collateral filling, moderate disease in Circumflex  2. No focal targets for PCI  3. Ischemic cardiomyopathy with acute systolic CHF   58/8/50 Echo LVEF 15-20%, moderate LVH, severe diffuse hypokinesis, restrictive diastolic dysfunction, multiple WMAs, moderate MR, severe RV dysfunction RV TAPSE 0.7 cm, PASP 61 mmHg.   12/2013 Lab TC 175 TG 111 LDL 116 HDL 37  TSH 7.7  HgbA1c 6.6 Na 143 K 4.5 Cr 1.22 GFR56   04/2014 Echo Study Conclusions  - Left ventricle: The cavity size was normal. Wall thickness was increased in a pattern of mild LVH. Systolic function was mildly to moderately reduced. The estimated ejection fraction was in the range of 40% to 45%. There is diastolic dysfunction, indeterminant grade. - Regional wall motion abnormality: Hypokinesis of the mid anterior and basal-mid anteroseptal myocardium. - Aortic valve: Moderately calcified annulus. Trileaflet; moderately thickened leaflets. Valve area (VTI): 2.48 cm^2. Valve area (Vmax): 2.23 cm^2. - Mitral valve: Moderately calcified annulus. Mildly thickened leaflets . - Left atrium: The atrium was moderately dilated. - Right atrium: The atrium was mildly dilated. - Technically adequate study.   01/2015 Carotid US bilaterall 1-39%    01/2018 echo Study Conclusions  - Left ventricle: The cavity size was  normal. Wall thickness was increased in a pattern of moderate LVH. Systolic function was moderately reduced. The estimated ejection fraction was in the range of 35% to 40%. Diffuse hypokinesis. Features are consistent with a pseudonormal left ventricular filling pattern, with concomitant abnormal relaxation and increased filling pressure (grade 2 diastolic dysfunction). Doppler parameters are consistent with high ventricular filling pressure. - Regional wall motion abnormality: Moderate hypokinesis of the basal-mid anteroseptal, mid inferoseptal, and mid inferior myocardium. - Aortic valve: Moderately calcified annulus. Mildly thickened, mildly calcified leaflets. There appears to be at least mild (if not mild to moderate) calcific aortic valvular stenosis. I suspect peak velocity and mean gradient is underestimated due to depressed LV function. Peak velocity (S): 159 cm/s. Mean gradient (S): 6 mm Hg. Valve area (VTI): 1.66 cm^2. Valve area (Vmax): 1.73 cm^2. Valve area (Vmean): 1.52 cm^2. - Mitral valve: Calcified annulus. Restricted leaflet motion due to depressed LV function. There was mild regurgitation. Valve area by continuity equation (using LVOT flow): 1.21 cm^2. - Left atrium: The atrium was mildly dilated. - Right ventricle: Systolic function was moderately reduced. - Tricuspid valve: There was mild regurgitation  Labs/Other Tests and Data Reviewed:    EKG:  No ECG reviewed.  Recent Labs: 01/10/2019: B Natriuretic Peptide 2,650.0 01/11/2019: ALT 39 01/18/2019: Hemoglobin 11.6; Platelets 105 02/05/2019: BUN 23; Creatinine, Ser 1.36; Magnesium 2.2; Potassium 4.7; Sodium 139   Recent Lipid Panel Lab Results  Component Value Date/Time   CHOL 195 06/04/2018 11:32 AM   TRIG 116 06/04/2018 11:32 AM   HDL 45 06/04/2018 11:32 AM   CHOLHDL 4.3 06/04/2018 11:32 AM   LDLCALC 127 (H) 06/04/2018 11:32 AM    Wt Readings from Last 3 Encounters:   05/26/19 188 lb (85.3 kg)  03/19/19 184 lb (83.5 kg)  02/04/19 176 lb 9.6 oz (80.1 kg)     Objective:    Vital Signs:  Today's Vitals   07/28/19 0932  BP: (!) 150/63  Pulse: (!) 59  Temp: (!) 97.3 F (36.3 C)  SpO2: 96%  Weight: 184 lb (83.5 kg)  Height: 5' 11.5" (1.816 m)   Body mass index is 25.31 kg/m.   ASSESSMENT & PLAN:    1. Acute on chronic systolic HF - recent symptoms of abdominal distension and weight gain up to 189 lbs. Lasix increased to 60mg  daily, symptoms resolved and weights down to 184 lbs. He does well with weights in mid 180s, around 180 he is overdiuresed and hypotensive and closer to 190s he is volume overloaded and symptomatic - change lasix to 60mg  alternating days with 40mg .      COVID-19 Education: The signs and symptoms  of COVID-19 were discussed with the patient and how to seek care for testing (follow up with PCP or arrange E-visit).  The importance of social distancing was discussed today.  Time:   Today, I have spent 14 minutes with the patient with telehealth technology discussing the above problems.     Medication Adjustments/Labs and Tests Ordered: Current medicines are reviewed at length with the patient today.  Concerns regarding medicines are outlined above.   Tests Ordered: No orders of the defined types were placed in this encounter.   Medication Changes: No orders of the defined types were placed in this encounter.   Follow Up:  Virtual Visit in 6 week(s)  Signed, Carlyle Dolly, MD  07/28/2019 9:25 AM    [place]

## 2019-07-28 NOTE — Addendum Note (Signed)
Addended by: Laurine Blazer on: 07/28/2019 12:10 PM   Modules accepted: Orders

## 2019-07-28 NOTE — Patient Instructions (Addendum)
Medication Instructions:   Change your Lasix to 60mg  alternating with 40mg  every other day.   Continue all other medications.    Labwork:  BMET, Mg - will do at [facility] will contact with results via phone or letter.    Testing/Procedures: none  Follow-Up: 6 weeks   Any Other Special Instructions Will Be Listed Below (If Applicable).  If you need a refill on your cardiac medications before your next appointment, please call your pharmacy.

## 2019-07-29 ENCOUNTER — Other Ambulatory Visit (HOSPITAL_COMMUNITY)
Admission: RE | Admit: 2019-07-29 | Discharge: 2019-07-29 | Disposition: A | Discharge status: Home / Self Care | Payer: Medicare Other | Source: Ambulatory Visit | Attending: Cardiology | Admitting: Cardiology

## 2019-07-29 DIAGNOSIS — I10 Essential (primary) hypertension: Secondary | ICD-10-CM | POA: Insufficient documentation

## 2019-07-29 DIAGNOSIS — I5042 Chronic combined systolic (congestive) and diastolic (congestive) heart failure: Secondary | ICD-10-CM | POA: Diagnosis not present

## 2019-07-29 LAB — BASIC METABOLIC PANEL
Anion gap: 8 (ref 5–15)
BUN: 29 mg/dL — ABNORMAL HIGH (ref 8–23)
CO2: 28 mmol/L (ref 22–32)
Calcium: 9.7 mg/dL (ref 8.9–10.3)
Chloride: 102 mmol/L (ref 98–111)
Creatinine, Ser: 1.54 mg/dL — ABNORMAL HIGH (ref 0.61–1.24)
GFR calc Af Amer: 47 mL/min — ABNORMAL LOW (ref >60)
GFR calc non Af Amer: 41 mL/min — ABNORMAL LOW (ref >60)
Glucose, Bld: 145 mg/dL — ABNORMAL HIGH (ref 70–99)
Potassium: 4 mmol/L (ref 3.5–5.1)
Sodium: 138 mmol/L (ref 135–145)

## 2019-07-29 LAB — MAGNESIUM: Magnesium: 2.1 mg/dL (ref 1.7–2.4)

## 2019-08-04 ENCOUNTER — Telehealth: Payer: Self-pay | Admitting: *Deleted

## 2019-08-04 NOTE — Telephone Encounter (Signed)
-----   Message from Arnoldo Lenis, MD sent at 08/03/2019 11:56 AM EDT ----- Labs do show he was starting to get a little dehydrated on lasix 60 mg daily, please verify he changed as we recommended last visit to 60mg  alternating days with 40mg    Zandra Abts MD

## 2019-08-04 NOTE — Telephone Encounter (Signed)
Pt verified that he has been taking lasix 60 mg alternating 40 mg every other day - routed to pcp

## 2019-08-10 ENCOUNTER — Other Ambulatory Visit: Payer: Self-pay | Admitting: Cardiology

## 2019-08-10 MED ORDER — APIXABAN 5 MG PO TABS: 5.0000 mg | ORAL_TABLET | Freq: Two times a day (BID) | ORAL | 6 refills | Status: DC

## 2019-08-10 MED ORDER — CARVEDILOL 6.25 MG PO TABS: 6.2500 mg | ORAL_TABLET | Freq: Two times a day (BID) | ORAL | 1 refills | Status: DC

## 2019-08-10 NOTE — Telephone Encounter (Signed)
° ° ° °  1. Which medications need to be refilled? (please list name of each medication and dose if known)  COREG  6.25   & Eliquis 5 mg   2. Which pharmacy/location (including street and city if local pharmacy) is medication to be sent to?  [facility] , [place], [place]   3. Do they need a 30 day or 90 day supply?

## 2019-08-10 NOTE — Telephone Encounter (Signed)
Medication sent to pharmacy  

## 2019-09-08 ENCOUNTER — Telehealth (INDEPENDENT_AMBULATORY_CARE_PROVIDER_SITE_OTHER): Payer: Medicare Other | Admitting: Student

## 2019-09-08 ENCOUNTER — Other Ambulatory Visit: Payer: Self-pay

## 2019-09-08 ENCOUNTER — Encounter: Payer: Self-pay | Admitting: Student

## 2019-09-08 VITALS — BP 133/55 | HR 65 | Temp 98.4°F | Ht 71.0 in | Wt 188.0 lb

## 2019-09-08 DIAGNOSIS — Z79899 Other long term (current) drug therapy: Secondary | ICD-10-CM

## 2019-09-08 DIAGNOSIS — I5042 Chronic combined systolic (congestive) and diastolic (congestive) heart failure: Secondary | ICD-10-CM | POA: Diagnosis not present

## 2019-09-08 DIAGNOSIS — I10 Essential (primary) hypertension: Secondary | ICD-10-CM

## 2019-09-08 DIAGNOSIS — I4819 Other persistent atrial fibrillation: Secondary | ICD-10-CM

## 2019-09-08 DIAGNOSIS — I2510 Atherosclerotic heart disease of native coronary artery without angina pectoris: Secondary | ICD-10-CM

## 2019-09-08 DIAGNOSIS — N182 Chronic kidney disease, stage 2 (mild): Secondary | ICD-10-CM

## 2019-09-08 NOTE — Patient Instructions (Signed)
Medication Instructions:  Your physician recommends that you continue on your current medications as directed. Please refer to the Current Medication list given to you today.  If you need a refill on your cardiac medications before your next appointment, please call your pharmacy.   Lab work: Your physician recommends that you return for lab work in: BMET  If you have labs (blood work) drawn today and your tests are completely normal, you will receive your results only by: Marland Kitchen MyChart Message (if you have MyChart) OR . A paper copy in the mail If you have any lab test that is abnormal or we need to change your treatment, we will call you to review the results.  Testing/Procedures: NONE   Follow-Up: At [facility], you and your health needs are our priority.  As part of our continuing mission to provide you with exceptional heart care, we have created designated Provider Care Teams.  These Care Teams include your primary Cardiologist (physician) and Advanced Practice Providers (APPs -  Physician Assistants and Nurse Practitioners) who all work together to provide you with the care you need, when you need it. You will need a follow up appointment in 3 months.  Please call our office 2 months in advance to schedule this appointment.  You may see Carlyle Dolly, MD or one of the following Advanced Practice Providers on your designated Care Team:   Bernerd Pho, PA-C [facility]) . Ermalinda Barrios, PA-C ([place])  Any Other Special Instructions Will Be Listed Below (If Applicable). Thank you for choosing [facility]!

## 2019-09-08 NOTE — Progress Notes (Signed)
Virtual Visit via Telephone Note   This visit type was conducted due to national recommendations for restrictions regarding the COVID-19 Pandemic (e.g. social distancing) in an effort to limit this patient's exposure and mitigate transmission in our community.  Due to his co-morbid illnesses, this patient is at least at moderate risk for complications without adequate follow up.  This format is felt to be most appropriate for this patient at this time.  The patient did not have access to video technology/had technical difficulties with video requiring transitioning to audio format only (telephone).  All issues noted in this document were discussed and addressed.  No physical exam could be performed with this format.  Please refer to the patient's chart for his  consent to telehealth for Gary Odonnell.   Date:  09/08/2019   ID:  Gary Odonnell, DOB 09-Oct-1932, MRN [id number]  Patient Location: Home Provider Location: Office  PCP:  Gary Chroman, MD  Cardiologist:  Gary Dolly, MD  Electrophysiologist:  None   Evaluation Performed:  Follow-Up Visit  Chief Complaint:  6-week vist  History of Present Illness:    Gary Odonnell is a 85 y.o. male with past medical history of CAD (s/p CABG with most recent cath in 2014 showing patent LIMA-LAD, occluded Radial to OM1-OM2-PDA and occluded RCA with L--> R collaterals), ischemic cardiomyopathy (EF 40-45% in 2015, at EF 30-35% by echo in 12/2018), PVD, HTN, HLD, and persistent atrial fibrillation who presents for a 6-week follow-up telehealth visit.   He most recently had a teleheath visit with Dr. Harl Bowie on 07/28/2019 and had recently been experiencing worsening dyspnea and abdominal distension but he would experience hypotension and dizziness if weight was at 180 lbs and symptoms with volume overload with weight in the 190's, therefore his goal weight was around the mid-180's. Was 184 lbs at the time of his visit and he overall felt well,  therefore Lasix was reduced from 60mg  daily to 60mg  daily alternating with 40mg  daily. Labs on 07/29/2019 showed creatinine was elevated to 1.54 with K+ at 4.0.  In talking with the patient and his wife today, he reports overall doing well since his last visit. He is active for his age and still does the yard work along with weed eating and denies any chest pain or palpitations with this. He does have stable dyspnea on exertion but denies any acute changes. No recent orthopnea, PND or lower extremity edema. He says that his weight fluctuates between 184 - 188 lbs but has not went above 190 lbs. He feels that his lower extremity strength continues to improve but he still has balance issues. Denies any recent falls.  He does follow a low-sodium diet and does not add additional salt to his food. He does not consume water regularly and says he drinks several glasses of unsweetened tea a day along with 1 to 2 cups of coffee.  The patient does not have symptoms concerning for COVID-19 infection (fever, chills, cough, or new shortness of breath).    Past Medical History:  Diagnosis Date   Cancer ([place])    L lung   CHF (congestive heart failure) ([place])    a. EF 40-45% in 2015, at EF 30-35% by echo in 12/2018   Coronary artery disease    a. s/p CABG with most recent cath in 2014 showing patent LIMA-LAD, occluded Radial to OM1-OM2-PDA and occluded RCA with L--> R collaterals   Hypercholesteremia    Hypertension    PAD (peripheral  artery disease) ([place])    Pneumonia 09/2013   Radiation april 2016   Solitary pulmonary nodule on lung CT 12/13/13   11/15/14   Stroke ([place]) 11/2008   Past Surgical History:  Procedure Laterality Date   Angioplasty to Left femoral artery     CARPAL TUNNEL RELEASE Left 05/11/2014   Procedure: LEFT CARPAL TUNNEL RELEASE;  Surgeon: Carole Civil, MD;  Location: [facility];  Service: Orthopedics;  Laterality: Left;   COLONOSCOPY N/A 01/24/2015   Procedure: COLONOSCOPY;   Surgeon: Danie Binder, MD;  Location: [facility];  Service: Endoscopy;  Laterality: N/A;  130   CORONARY ARTERY BYPASS GRAFT     x4   FEMORAL-POPLITEAL BYPASS GRAFT Right 10/23/2015   Procedure: BYPASS GRAFT FEMORAL TO BELOW KNEE POPLITEAL ARTERY USING RIGHT NON-REVERSED GREATER SAPPHENOUS VEIN;  Surgeon: [name], MD;  Location: [place];  Service: Vascular;  Laterality: Right;   INTRAOPERATIVE ARTERIOGRAM Right 10/23/2015   Procedure: INTRA OPERATIVE ARTERIOGRAM - RIGHT LOWER LEG;  Surgeon: [name], MD;  Location: [place];  Service: Vascular;  Laterality: Right;   KNEE ARTHROSCOPY Right    LEFT AND RIGHT HEART CATHETERIZATION WITH CORONARY ANGIOGRAM N/A 09/29/2013   Procedure: LEFT AND RIGHT HEART CATHETERIZATION WITH CORONARY ANGIOGRAM;  Surgeon: Burnell Blanks, MD;  Location: [facility] CATH LAB;  Service: Cardiovascular;  Laterality: N/A;   PERIPHERAL VASCULAR CATHETERIZATION N/A 04/21/2015   Procedure: Abdominal Aortogram w/Lower Extremity;  Surgeon: [name], MD;  Location: [place] CV LAB;  Service: Cardiovascular;  Laterality: N/A;   Stent to right femoral artery     TRIGGER FINGER RELEASE Bilateral    VEIN HARVEST Right 10/23/2015   Procedure: [place];  Surgeon: [name], MD;  Location: [place];  Service: Vascular;  Laterality: Right;     Current Meds  Medication Sig   apixaban (ELIQUIS) 5 MG TABS tablet Take 1 tablet (5 mg total) by mouth 2 (two) times daily.   Carboxymethylcellulose Sodium (EYE DROPS OP) Apply 1 drop to eye daily as needed (dry eyes).    carvedilol (COREG) 6.25 MG tablet Take 1 tablet (6.25 mg total) by mouth 2 (two) times daily with a meal.   ezetimibe (ZETIA) 10 MG tablet Take 1 tablet (10 mg total) by mouth daily.   furosemide (LASIX) 40 MG tablet Take one tab (40mg ) by mouth every other day alternating with 1 1/2 tabs (60mg ) every other day.   potassium chloride SA (K-DUR,KLOR-CON)  20 MEQ tablet Take 1 tablet (20 mEq total) by mouth daily for 30 days.   sacubitril-valsartan (ENTRESTO) 49-51 MG Take 1 tablet by mouth 2 (two) times daily.     Allergies:   Patient has no known allergies.   Social History   Tobacco Use   Smoking status: Current Every Day Smoker    Packs/day: 0.50    Years: 60.00    Pack years: 30.00    Types: Cigars, Cigarettes    Start date: 11/21/1949    Last attempt to quit: 01/05/2019    Years since quitting: 0.6   Smokeless tobacco: Never Used   Tobacco comment: 1-2 cigars per day   Substance Use Topics   Alcohol use: No    Alcohol/week: 0.0 standard drinks   Drug use: No     Family Hx: The patient's family history includes Diabetes in his brother and mother; Heart attack in his father; Hyperlipidemia in his father. There is no history of  Colon cancer.  ROS:   Please see the history of present illness.     All other systems reviewed and are negative.   Prior CV studies:   The following studies were reviewed today:  Echocardiogram: 12/2018 IMPRESSIONS    1. The left ventricle has moderate-severely reduced systolic function, with an ejection fraction of 30-35%. The cavity size was mildly dilated. There is moderately increased left ventricular wall thickness. Left ventricular diastolic Doppler parameters  are indeterminate.  2. The right ventricle has normal systolic function. The cavity was normal. There is no increase in right ventricular wall thickness.  3. Left atrial size was moderately dilated.  4. Trivial pericardial effusion is present.  5. The mitral valve is degenerative. Moderate thickening of the mitral valve leaflet. Moderate calcification of the mitral valve leaflet. There is moderate mitral annular calcification present.  6. The tricuspid valve is normal in structure.  7. The aortic valve is tricuspid Moderate thickening of the aortic valve Moderate calcification of the aortic valve. mild stenosis of the  aortic valve.  8. The pulmonic valve was grossly normal. Pulmonic valve regurgitation is mild by color flow Doppler.  9. Right atrial pressure is estimated at 8 mmHg.  Labs/Other Tests and Data Reviewed:    EKG:  No ECG reviewed.  Recent Labs: 01/10/2019: B Natriuretic Peptide 2,650.0 01/11/2019: ALT 39 01/18/2019: Hemoglobin 11.6; Platelets 105 07/29/2019: BUN 29; Creatinine, Ser 1.54; Magnesium 2.1; Potassium 4.0; Sodium 138   Recent Lipid Panel Lab Results  Component Value Date/Time   CHOL 195 06/04/2018 11:32 AM   TRIG 116 06/04/2018 11:32 AM   HDL 45 06/04/2018 11:32 AM   CHOLHDL 4.3 06/04/2018 11:32 AM   LDLCALC 127 (H) 06/04/2018 11:32 AM    Wt Readings from Last 3 Encounters:  09/08/19 188 lb (85.3 kg)  07/28/19 184 lb (83.5 kg)  05/26/19 188 lb (85.3 kg)     Objective:    Vital Signs:  BP (!) 133/55    Pulse 65    Temp 98.4 F (36.9 C)    Ht 5\' 11"  (1.803 m)    Wt 188 lb (85.3 kg)    SpO2 98%    BMI 26.22 kg/m    General: Pleasant, male sounding in NAD Psych: Normal affect. Neuro: Alert and oriented X 3. Lungs:  Resp regular and unlabored while talking on the phone.    ASSESSMENT & PLAN:    1. Chronic Systolic CHF/Ischemic Cardiomyopathy - He has a known reduced EF of 30 to 35% by most recent echocardiogram. He reports of volume status has overall been at baseline and weight has been stable between 184 - 188 lbs on his home scales.  - Will continue Lasix at current dosing of 40 mg daily alternating with 60 mg daily.  Will recheck a BMET given elevated creatinine by most recent assessment.  - continue Coreg 6.25mg  BID along with Entresto 49-51mg  BID. Continued sodium and fluid restriction reviewed.    2. CAD - s/p CABG with most recent cath in 2014 showing patent LIMA-LAD, occluded Radial to OM1-OM2-PDA and occluded RCA with L--> R collaterals.  - He denies any recent chest pain and reports his breathing has overall been at baseline. Continue current medication  regimen with beta-blocker and Zetia. He has been intolerant to statins and is not on ASA given the need for anticoagulation.  3. Persistent Atrial Fibrillation - He denies any recent palpitations and heart rate has overall been well controlled in the 50's to 70's  when checked at home. Continue Coreg 6.25 mg twice daily for rate control. Given the persistence of his arrhythmia and atrial enlargement by most recent echocardiogram, suspect a rate control strategy will be pursued. - He denies any evidence of active bleeding. Remains on Eliquis 5 mg twice daily for anticoagulation.  4. HTN - BP has been well controlled when checked at home, at 133/55 on most recent check.  Continue Coreg 6.25 mg twice daily and Entresto 49-51mg  BID.   5. Stage 2-3 CKD - baseline creatinine 1.1 - 1.3. Elevated to 1.54 on 07/29/2019. Recheck BMET.    COVID-19 Education: The signs and symptoms of COVID-19 were discussed with the patient and how to seek care for testing (follow up with PCP or arrange E-visit).  The importance of social distancing was discussed today.  Time:   Today, I have spent 16 minutes with the patient with telehealth technology discussing the above problems.     Medication Adjustments/Labs and Tests Ordered: Current medicines are reviewed at length with the patient today.  Concerns regarding medicines are outlined above.   Tests Ordered: Orders Placed This Encounter  Procedures   Basic Metabolic Panel (BMET)    Medication Changes: No orders of the defined types were placed in this encounter.   Follow Up:  Either In Person or Virtual Visit in 3 month(s)  Signed, Erma Heritage, PA-C  09/08/2019 4:18 PM    [facility]

## 2019-09-17 ENCOUNTER — Other Ambulatory Visit (HOSPITAL_COMMUNITY)
Admission: RE | Admit: 2019-09-17 | Discharge: 2019-09-17 | Disposition: A | Discharge status: Home / Self Care | Payer: Medicare Other | Source: Ambulatory Visit | Attending: Student | Admitting: Student

## 2019-09-17 DIAGNOSIS — Z79899 Other long term (current) drug therapy: Secondary | ICD-10-CM | POA: Insufficient documentation

## 2019-09-17 LAB — BASIC METABOLIC PANEL
Anion gap: 9 (ref 5–15)
BUN: 21 mg/dL (ref 8–23)
CO2: 29 mmol/L (ref 22–32)
Calcium: 10 mg/dL (ref 8.9–10.3)
Chloride: 103 mmol/L (ref 98–111)
Creatinine, Ser: 1.49 mg/dL — ABNORMAL HIGH (ref 0.61–1.24)
GFR calc Af Amer: 49 mL/min — ABNORMAL LOW (ref >60)
GFR calc non Af Amer: 42 mL/min — ABNORMAL LOW (ref >60)
Glucose, Bld: 200 mg/dL — ABNORMAL HIGH (ref 70–99)
Potassium: 4.3 mmol/L (ref 3.5–5.1)
Sodium: 141 mmol/L (ref 135–145)

## 2019-10-01 ENCOUNTER — Telehealth: Payer: Self-pay | Admitting: Cardiology

## 2019-10-01 NOTE — Telephone Encounter (Signed)
Ok to write order to stop home oxygen, Looks like was started 12/2018 at discharge when he had pneumonia   Zandra Abts MD

## 2019-10-01 NOTE — Telephone Encounter (Signed)
Patient wife calling stating they need an order stating that her husband does not need oxygen

## 2019-10-01 NOTE — Telephone Encounter (Signed)
Wife Luellen Pucker) calling stating he no longer needs.  Has been working outside as well & has not been using it.  Stated that vitals & oxygen levels have been fine.  Stated this was given to him in February by someone in the hospital at [facility].

## 2019-10-01 NOTE — Telephone Encounter (Signed)
Luellen Pucker (wife) notified.  Call placed to [facility] - give verbal order to stop home oxygen to [facility].

## 2019-10-11 ENCOUNTER — Other Ambulatory Visit: Payer: Self-pay | Admitting: *Deleted

## 2019-10-11 ENCOUNTER — Telehealth: Payer: Self-pay | Admitting: Cardiology

## 2019-10-11 MED ORDER — APIXABAN 5 MG PO TABS: 5.0000 mg | ORAL_TABLET | Freq: Two times a day (BID) | ORAL | 0 refills | Status: DC

## 2019-10-11 MED ORDER — SACUBITRIL-VALSARTAN 49-51 MG PO TABS: 1.0000 | ORAL_TABLET | Freq: Two times a day (BID) | ORAL | 1 refills | Status: DC

## 2019-10-11 MED ORDER — APIXABAN 5 MG PO TABS: 5.0000 mg | ORAL_TABLET | Freq: Two times a day (BID) | ORAL | 1 refills | Status: DC

## 2019-10-11 NOTE — Telephone Encounter (Signed)
Pt wife says [facility] is going to charge $400 for Eliquis and requested that we send Eliquis #90 to [facility] - will also give samples as pt will be out of meds by the end of the week - pt wife will call when she comes to office so we can bring samples out to her

## 2019-10-11 NOTE — Telephone Encounter (Signed)
Eliquis needing

## 2019-11-05 ENCOUNTER — Other Ambulatory Visit: Payer: Self-pay | Admitting: *Deleted

## 2019-11-05 MED ORDER — SACUBITRIL-VALSARTAN 49-51 MG PO TABS: 1.0000 | ORAL_TABLET | Freq: Two times a day (BID) | ORAL | 1 refills | Status: DC

## 2019-11-05 MED ORDER — CARVEDILOL 6.25 MG PO TABS: 6.2500 mg | ORAL_TABLET | Freq: Two times a day (BID) | ORAL | 1 refills | Status: DC

## 2019-12-01 ENCOUNTER — Other Ambulatory Visit: Payer: Self-pay | Admitting: Cardiology

## 2019-12-01 MED ORDER — FUROSEMIDE 40 MG PO TABS: ORAL_TABLET | ORAL | 3 refills | Status: DC

## 2019-12-01 MED ORDER — EZETIMIBE 10 MG PO TABS: 10.0000 mg | ORAL_TABLET | Freq: Every day | ORAL | 3 refills | Status: DC

## 2019-12-01 NOTE — Telephone Encounter (Signed)
      Patient called to let us know they received too much of one medication and not the other two that was needed     *STAT* If patient is at the pharmacy, call can be transferred to refill team.   1. Which medications need to be refilled? furosemide (LASIX) 40 MG tablet ezetimibe (ZETIA) 10 MG tablet       2. Which pharmacy/location (including street and city if local pharmacy) is medication to be sent to? [facility]  3. Do they need a 30 day or 90 day supply?    Asked for someone to give [facility] their telephone number

## 2019-12-13 ENCOUNTER — Telehealth: Payer: Medicare Other | Admitting: Cardiology

## 2019-12-15 ENCOUNTER — Telehealth (INDEPENDENT_AMBULATORY_CARE_PROVIDER_SITE_OTHER): Payer: Medicare Other | Admitting: Cardiology

## 2019-12-15 ENCOUNTER — Encounter: Payer: Self-pay | Admitting: Cardiology

## 2019-12-15 VITALS — BP 121/65 | HR 65 | Temp 97.3°F | Ht 71.0 in | Wt 190.0 lb

## 2019-12-15 DIAGNOSIS — I10 Essential (primary) hypertension: Secondary | ICD-10-CM

## 2019-12-15 DIAGNOSIS — R7309 Other abnormal glucose: Secondary | ICD-10-CM

## 2019-12-15 DIAGNOSIS — I2510 Atherosclerotic heart disease of native coronary artery without angina pectoris: Secondary | ICD-10-CM

## 2019-12-15 DIAGNOSIS — I5022 Chronic systolic (congestive) heart failure: Secondary | ICD-10-CM

## 2019-12-15 DIAGNOSIS — I4891 Unspecified atrial fibrillation: Secondary | ICD-10-CM

## 2019-12-15 DIAGNOSIS — E782 Mixed hyperlipidemia: Secondary | ICD-10-CM

## 2019-12-15 NOTE — Patient Instructions (Signed)
Medication Instructions:  Your physician recommends that you continue on your current medications as directed. Please refer to the Current Medication list given to you today.   Labwork: ASAP  Testing/Procedures: NONE  Follow-Up: Your physician recommends that you schedule a follow-up appointment in: 4 MONTHS   Any Other Special Instructions Will Be Listed Below (If Applicable).     If you need a refill on your cardiac medications before your next appointment, please call your pharmacy.

## 2019-12-15 NOTE — Progress Notes (Signed)
Virtual Visit via Telephone Note   This visit type was conducted due to national recommendations for restrictions regarding the COVID-19 Pandemic (e.g. social distancing) in an effort to limit this patient's exposure and mitigate transmission in our community.  Due to his co-morbid illnesses, this patient is at least at moderate risk for complications without adequate follow up.  This format is felt to be most appropriate for this patient at this time.  The patient did not have access to video technology/had technical difficulties with video requiring transitioning to audio format only (telephone).  All issues noted in this document were discussed and addressed.  No physical exam could be performed with this format.  Please refer to the patient's chart for his  consent to telehealth for [facility].   Date:  12/15/2019   ID:  Gary Odonnell, DOB [birth year], MRN [id number]  Patient Location: Home Provider Location: Home  PCP:  Glenda Chroman, MD  Cardiologist:  Carlyle Dolly, MD  Electrophysiologist:  None   Evaluation Performed:  Follow-Up Visit  Chief Complaint:  Follow up  History of Present Illness:    Gary Odonnell is a 86 y.o. male seen today for follow up of the following medical problem.s   1. CAD/ICM/Chronic systolic heart failure - history of prior CABG 2003 in [place]  - 09/2013 admission to [facility] with decompensated heart failure. Workup included an echo which showed an LVEF of 15%. He was transferred to [facility] for further management.  - cath 09/2013 showed LM 30%, LAD 100% proximal, LCX 50% ostial, RCA 100% mid with distal vessel filling with right to right and left to right collaterals. LIMA-LAD patent, graft from [place] to OM1, OM2, PDA occluded. No PCI targets, recommendations for medical management. RHC mean PA 43, wedge 22, CI 1.74.  - repeat echo 04/2014 shows LVEF improved to 40-45%. - 01/2018 echo LVEF 35-40%, grade II diastolic  dysfunction.     - no recent SOB. Weight is up to 190 lbs. Denies any edema. No recent chest pain.  - taking lasix 60mg  alternating with 40mg     2. Afib - new diagnosis during 12/27/2018 ER visit. Eliquis started in clniic 12/29/17 - CHADS2Vasc score is (age x2, CHF, HTN, stroke x2, CAD) is 7   -no recent palpitatoins     3. Hyperlipidemia  - lipitor and crestor have caused prior muscle aches, not on statin - 06/2017 TC 196 TG 105 HDL 36 LDL 139 . Has been resistant to pcsk9 inhibitors.   - remains compliant with zetia  3. Lung CA - has completed radiation treatment.  - followed by Dr Dwana Curd  4. PAD - followed by vascular, previous stenting to left external iliac and right common iliac.  - s/p right femoral to below-knee popliteal bypass with vein 10/23/2015.  - no recent symptoms  5. Carotid stenosis - repeat US 01/2015 with only mild bilateral disease.  - no recent neuro symptoms.   6. HTN -he is compliant with meds   7. Hypothyroidism - patient has been reluctant for treatment. - followed by pcp       The patient does not have symptoms concerning for COVID-19 infection (fever, chills, cough, or new shortness of breath).    Past Medical History:  Diagnosis Date  . Cancer ([place])    L lung  . CHF (congestive heart failure) ([place])    a. EF 40-45% in 2015, at EF 30-35% by echo in 12/2018  . Coronary artery disease  a. s/p CABG with most recent cath in 2014 showing patent LIMA-LAD, occluded Radial to OM1-OM2-PDA and occluded RCA with L--> R collaterals  . Hypercholesteremia   . Hypertension   . PAD (peripheral artery disease) ([place])   . Pneumonia 09/2013  . Radiation april 2016  . Solitary pulmonary nodule on lung CT 12/13/13   11/15/14  . Stroke [facility]) 11/2008   Past Surgical History:  Procedure Laterality Date  . Angioplasty to Left femoral artery    . CARPAL TUNNEL RELEASE Left 05/11/2014   Procedure: LEFT CARPAL TUNNEL  RELEASE;  Surgeon: Carole Civil, MD;  Location: [facility] ORS;  Service: Orthopedics;  Laterality: Left;  . COLONOSCOPY N/A 01/24/2015   Procedure: COLONOSCOPY;  Surgeon: Danie Binder, MD;  Location: [facility] ENDO SUITE;  Service: Endoscopy;  Laterality: N/A;  130  . CORONARY ARTERY BYPASS GRAFT     x4  . FEMORAL-POPLITEAL BYPASS GRAFT Right 10/23/2015   Procedure: BYPASS GRAFT FEMORAL TO BELOW KNEE POPLITEAL ARTERY USING RIGHT NON-REVERSED GREATER SAPPHENOUS VEIN;  Surgeon: [name], MD;  Location: [place];  Service: Vascular;  Laterality: Right;  . INTRAOPERATIVE ARTERIOGRAM Right 10/23/2015   Procedure: INTRA OPERATIVE ARTERIOGRAM - RIGHT LOWER LEG;  Surgeon: [name], MD;  Location: [place];  Service: Vascular;  Laterality: Right;  . KNEE ARTHROSCOPY Right   . LEFT AND RIGHT HEART CATHETERIZATION WITH CORONARY ANGIOGRAM N/A 09/29/2013   Procedure: LEFT AND RIGHT HEART CATHETERIZATION WITH CORONARY ANGIOGRAM;  Surgeon: Burnell Blanks, MD;  Location: [facility] CATH LAB;  Service: Cardiovascular;  Laterality: N/A;  . PERIPHERAL VASCULAR CATHETERIZATION N/A 04/21/2015   Procedure: Abdominal Aortogram w/Lower Extremity;  Surgeon: [name], MD;  Location: [place] CV LAB;  Service: Cardiovascular;  Laterality: N/A;  . Stent to right femoral artery    . TRIGGER FINGER RELEASE Bilateral   . VEIN HARVEST Right 10/23/2015   Procedure: VEIN HARVEST - RIGHT GREATER SAPPHENOUS;  Surgeon: [name], MD;  Location: [place];  Service: Vascular;  Laterality: Right;     Current Meds  Medication Sig  . apixaban (ELIQUIS) 5 MG TABS tablet Take 1 tablet (5 mg total) by mouth 2 (two) times daily.  . Carboxymethylcellulose Sodium (EYE DROPS OP) Apply 1 drop to eye daily as needed (dry eyes).   . carvedilol (COREG) 6.25 MG tablet Take 1 tablet (6.25 mg total) by mouth 2 (two) times daily with a meal.  . ezetimibe (ZETIA) 10 MG tablet Take 1 tablet (10 mg total) by mouth daily.  . furosemide  (LASIX) 40 MG tablet Take one tab (40mg ) by mouth every other day alternating with 1 1/2 tabs (60mg ) every other day.  . potassium chloride SA (K-DUR,KLOR-CON) 20 MEQ tablet Take 1 tablet (20 mEq total) by mouth daily for 30 days.  . sacubitril-valsartan (ENTRESTO) 49-51 MG Take 1 tablet by mouth 2 (two) times daily.     Allergies:   Patient has no known allergies.   Social History   Tobacco Use  . Smoking status: Current Every Day Smoker    Packs/day: 0.50    Years: 60.00    Pack years: 30.00    Types: Cigars, Cigarettes    Start date: 11/21/1949    Last attempt to quit: 01/05/2019    Years since quitting: 0.9  . Smokeless tobacco: Never Used  . Tobacco comment: 1-2 cigars per day   Substance Use Topics  . Alcohol use: No    Alcohol/week: 0.0 standard drinks  .  Drug use: No     Family Hx: The patient's family history includes Diabetes in his brother and mother; Heart attack in his father; Hyperlipidemia in his father. There is no history of Colon cancer.  ROS:   Please see the history of present illness.     All other systems reviewed and are negative.   Prior CV studies:   The following studies were reviewed today:  09/29/13 Cath Hemodynamic Findings: Ao: 162/70  LV: 163/19/29  RA: 12 RV: 66/7/16  PA: 67/26 (mean 43)  PCWP: 22  Fick Cardiac Output: 3.5 L/min  Fick Cardiac Index: 1.74 L/min/m2  Central Aortic Saturation: 91%  Pulmonary Artery Saturation: 50%  Angiographic Findings:  Left main: Distal 30% stenosis.  Left Anterior Descending Artery: 100% proximal occlusion. Proximal, mid and distal vessel fills from the patent IMA graft.  Circumflex Artery: Large caliber vessel with large intermediate Ameka Krigbaum. Diffuse non-obstructive plaque in the large bifurcating intermediate Ariabella Brien. The ostium of the Circumflex has 50% stenosis.  Right Coronary Artery: Large dominant vessel with 100% mid occlusion. The distal vessel fills right to right bridging  collaterals and left to right collaterals.  Graft Anatomy:  LIMA to LAD is patent  Free Radial (? Y graft from LIMA) to OM1, OM2, PDA is occluded.  Left Ventricular Angiogram: Deferred.  Impression:  1. Triple vessel CAD with patent LIMA to LAD, occluded RCA with good collateral filling, moderate disease in Circumflex  2. No focal targets for PCI  3. Ischemic cardiomyopathy with acute systolic CHF   10/28/57 Echo LVEF 15-20%, moderate LVH, severe diffuse hypokinesis, restrictive diastolic dysfunction, multiple WMAs, moderate MR, severe RV dysfunction RV TAPSE 0.7 cm, PASP 61 mmHg.   12/2013 Lab TC 175 TG 111 LDL 116 HDL 37  TSH 7.7 HgbA1c 6.6 Na 143 K 4.5 Cr 1.22 GFR56   04/2014 Echo Study Conclusions  - Left ventricle: The cavity size was normal. Wall thickness was increased in a pattern of mild LVH. Systolic function was mildly to moderately reduced. The estimated ejection fraction was in the range of 40% to 45%. There is diastolic dysfunction, indeterminant grade. - Regional wall motion abnormality: Hypokinesis of the mid anterior and basal-mid anteroseptal myocardium. - Aortic valve: Moderately calcified annulus. Trileaflet; moderately thickened leaflets. Valve area (VTI): 2.48 cm^2. Valve area (Vmax): 2.23 cm^2. - Mitral valve: Moderately calcified annulus. Mildly thickened leaflets . - Left atrium: The atrium was moderately dilated. - Right atrium: The atrium was mildly dilated. - Technically adequate study.   01/2015 Carotid US bilaterall 1-39%    01/2018 echo Study Conclusions  - Left ventricle: The cavity size was normal. Wall thickness was increased in a pattern of moderate LVH. Systolic function was moderately reduced. The estimated ejection fraction was in the range of 35% to 40%. Diffuse hypokinesis. Features are consistent with a pseudonormal left ventricular filling pattern, with concomitant abnormal relaxation and increased  filling pressure (grade 2 diastolic dysfunction). Doppler parameters are consistent with high ventricular filling pressure. - Regional wall motion abnormality: Moderate hypokinesis of the basal-mid anteroseptal, mid inferoseptal, and mid inferior myocardium. - Aortic valve: Moderately calcified annulus. Mildly thickened, mildly calcified leaflets. There appears to be at least mild (if not mild to moderate) calcific aortic valvular stenosis. I suspect peak velocity and mean gradient is underestimated due to depressed LV function. Peak velocity (S): 159 cm/s. Mean gradient (S): 6 mm Hg. Valve area (VTI): 1.66 cm^2. Valve area (Vmax): 1.73 cm^2. Valve area (Vmean): 1.52 cm^2. - Mitral valve: Calcified  annulus. Restricted leaflet motion due to depressed LV function. There was mild regurgitation. Valve area by continuity equation (using LVOT flow): 1.21 cm^2. - Left atrium: The atrium was mildly dilated. - Right ventricle: Systolic function was moderately reduced. - Tricuspid valve: There was mild regurgitation  Labs/Other Tests and Data Reviewed:    EKG:  No ECG reviewed.  Recent Labs: 01/10/2019: B Natriuretic Peptide 2,650.0 01/11/2019: ALT 39 01/18/2019: Hemoglobin 11.6; Platelets 105 07/29/2019: Magnesium 2.1 09/17/2019: BUN 21; Creatinine, Ser 1.49; Potassium 4.3; Sodium 141   Recent Lipid Panel Lab Results  Component Value Date/Time   CHOL 195 06/04/2018 11:32 AM   TRIG 116 06/04/2018 11:32 AM   HDL 45 06/04/2018 11:32 AM   CHOLHDL 4.3 06/04/2018 11:32 AM   LDLCALC 127 (H) 06/04/2018 11:32 AM    Wt Readings from Last 3 Encounters:  12/15/19 190 lb (86.2 kg)  09/08/19 188 lb (85.3 kg)  07/28/19 184 lb (83.5 kg)     Objective:    Vital Signs:  BP 121/65   Pulse 65   Temp (!) 97.3 F (36.3 C)   Ht 5\' 11"  (1.803 m)   Wt 190 lb (86.2 kg)   SpO2 97%   BMI 26.50 kg/m    Normal affect. Normal speech pattern and tone. Comfortable, no apparent  distress. No audible signs of SOB or wheezing.   ASSESSMENT & PLAN:    1. chronic systolic HF - prior weight had been around mid 180s. Up to 190 today but denies any edema or SOB or DOE - continue diuretic at this time,check labs  2. CAD - no symptoms, continue currentmeds  3. HTN - at goal, conitnue current meds  4. Hyperlipidemia - repeat labs, continue zetia. Intolerant to statins, has refused pcsk9-Is  5. Afib No symptoms, conitnue current meds    COVID-19 Education: The signs and symptoms of COVID-19 were discussed with the patient and how to seek care for testing (follow up with PCP or arrange E-visit).  The importance of social distancing was discussed today.  Time:   Today, I have spent 21 minutes with the patient with telehealth technology discussing the above problems.     Medication Adjustments/Labs and Tests Ordered: Current medicines are reviewed at length with the patient today.  Concerns regarding medicines are outlined above.   Tests Ordered: No orders of the defined types were placed in this encounter.   Medication Changes: No orders of the defined types were placed in this encounter.   Follow Up:  Virtual Visit  in 4 month(s)  Signed, Carlyle Dolly, MD  12/15/2019 11:21 AM    [place]

## 2019-12-20 ENCOUNTER — Other Ambulatory Visit: Payer: Self-pay | Admitting: *Deleted

## 2019-12-20 MED ORDER — FUROSEMIDE 40 MG PO TABS: ORAL_TABLET | ORAL | 3 refills | Status: DC

## 2019-12-21 ENCOUNTER — Other Ambulatory Visit (HOSPITAL_COMMUNITY)
Admission: RE | Admit: 2019-12-21 | Discharge: 2019-12-21 | Disposition: A | Discharge status: Home / Self Care | Payer: Medicare Other | Source: Ambulatory Visit | Attending: Cardiology | Admitting: Cardiology

## 2019-12-21 DIAGNOSIS — I2510 Atherosclerotic heart disease of native coronary artery without angina pectoris: Secondary | ICD-10-CM | POA: Insufficient documentation

## 2019-12-21 DIAGNOSIS — I4891 Unspecified atrial fibrillation: Secondary | ICD-10-CM | POA: Diagnosis not present

## 2019-12-21 DIAGNOSIS — R7309 Other abnormal glucose: Secondary | ICD-10-CM | POA: Insufficient documentation

## 2019-12-21 DIAGNOSIS — I5022 Chronic systolic (congestive) heart failure: Secondary | ICD-10-CM | POA: Insufficient documentation

## 2019-12-21 DIAGNOSIS — E782 Mixed hyperlipidemia: Secondary | ICD-10-CM | POA: Diagnosis not present

## 2019-12-21 LAB — COMPREHENSIVE METABOLIC PANEL
ALT: 14 U/L (ref 0–44)
AST: 14 U/L — ABNORMAL LOW (ref 15–41)
Albumin: 4 g/dL (ref 3.5–5.0)
Alkaline Phosphatase: 56 U/L (ref 38–126)
Anion gap: 8 (ref 5–15)
BUN: 24 mg/dL — ABNORMAL HIGH (ref 8–23)
CO2: 28 mmol/L (ref 22–32)
Calcium: 9.6 mg/dL (ref 8.9–10.3)
Chloride: 105 mmol/L (ref 98–111)
Creatinine, Ser: 1.31 mg/dL — ABNORMAL HIGH (ref 0.61–1.24)
GFR calc Af Amer: 57 mL/min — ABNORMAL LOW (ref >60)
GFR calc non Af Amer: 49 mL/min — ABNORMAL LOW (ref >60)
Glucose, Bld: 172 mg/dL — ABNORMAL HIGH (ref 70–99)
Potassium: 3.9 mmol/L (ref 3.5–5.1)
Sodium: 141 mmol/L (ref 135–145)
Total Bilirubin: 1.1 mg/dL (ref 0.3–1.2)
Total Protein: 7.2 g/dL (ref 6.5–8.1)

## 2019-12-21 LAB — CBC WITH DIFFERENTIAL/PLATELET
Abs Immature Granulocytes: 0.01 10*3/uL (ref 0.00–0.07)
Basophils Absolute: 0 10*3/uL (ref 0.0–0.1)
Basophils Relative: 1 %
Eosinophils Absolute: 0.3 10*3/uL (ref 0.0–0.5)
Eosinophils Relative: 6 %
HCT: 45.1 % (ref 39.0–52.0)
Hemoglobin: 13.8 g/dL (ref 13.0–17.0)
Immature Granulocytes: 0 %
Lymphocytes Relative: 18 %
Lymphs Abs: 1.1 10*3/uL (ref 0.7–4.0)
MCH: 30.5 pg (ref 26.0–34.0)
MCHC: 30.6 g/dL (ref 30.0–36.0)
MCV: 99.8 fL (ref 80.0–100.0)
Monocytes Absolute: 0.3 10*3/uL (ref 0.1–1.0)
Monocytes Relative: 5 %
Neutro Abs: 4.2 10*3/uL (ref 1.7–7.7)
Neutrophils Relative %: 70 %
Platelets: 147 10*3/uL — ABNORMAL LOW (ref 150–400)
RBC: 4.52 MIL/uL (ref 4.22–5.81)
RDW: 13.7 % (ref 11.5–15.5)
WBC: 6 10*3/uL (ref 4.0–10.5)
nRBC: 0 % (ref 0.0–0.2)

## 2019-12-21 LAB — LIPID PANEL
Cholesterol: 176 mg/dL (ref 0–200)
HDL: 36 mg/dL — ABNORMAL LOW (ref >40)
LDL Cholesterol: 118 mg/dL — ABNORMAL HIGH (ref 0–99)
Total CHOL/HDL Ratio: 4.9 RATIO
Triglycerides: 111 mg/dL (ref <150)
VLDL: 22 mg/dL (ref 0–40)

## 2019-12-21 LAB — HEMOGLOBIN A1C
Hgb A1c MFr Bld: 6.6 % — ABNORMAL HIGH (ref 4.8–5.6)
Mean Plasma Glucose: 142.72 mg/dL

## 2019-12-21 LAB — TSH: TSH: 13.346 u[IU]/mL — ABNORMAL HIGH (ref 0.350–4.500)

## 2019-12-21 LAB — MAGNESIUM: Magnesium: 2.2 mg/dL (ref 1.7–2.4)

## 2019-12-29 ENCOUNTER — Telehealth: Payer: Self-pay | Admitting: *Deleted

## 2019-12-29 NOTE — Telephone Encounter (Signed)
Pt voiced understanding - routed to pcp  

## 2019-12-29 NOTE — Telephone Encounter (Signed)
-----   Message from Arnoldo Lenis, MD sent at 12/29/2019  3:18 PM EST ----- Labs look ok. Thyroid still not regulated, please forward to pcp  Zandra Abts MD

## 2020-01-04 DIAGNOSIS — Z299 Encounter for prophylactic measures, unspecified: Secondary | ICD-10-CM | POA: Diagnosis not present

## 2020-01-04 DIAGNOSIS — I4891 Unspecified atrial fibrillation: Secondary | ICD-10-CM | POA: Diagnosis not present

## 2020-01-04 DIAGNOSIS — J449 Chronic obstructive pulmonary disease, unspecified: Secondary | ICD-10-CM | POA: Diagnosis not present

## 2020-01-04 DIAGNOSIS — E039 Hypothyroidism, unspecified: Secondary | ICD-10-CM | POA: Diagnosis not present

## 2020-01-04 DIAGNOSIS — I10 Essential (primary) hypertension: Secondary | ICD-10-CM | POA: Diagnosis not present

## 2020-01-04 DIAGNOSIS — I509 Heart failure, unspecified: Secondary | ICD-10-CM | POA: Diagnosis not present

## 2020-01-04 DIAGNOSIS — Z6838 Body mass index (BMI) 38.0-38.9, adult: Secondary | ICD-10-CM | POA: Diagnosis not present

## 2020-01-06 DIAGNOSIS — H02005 Unspecified entropion of left lower eyelid: Secondary | ICD-10-CM | POA: Diagnosis not present

## 2020-01-06 DIAGNOSIS — H04123 Dry eye syndrome of bilateral lacrimal glands: Secondary | ICD-10-CM | POA: Diagnosis not present

## 2020-01-06 DIAGNOSIS — H16142 Punctate keratitis, left eye: Secondary | ICD-10-CM | POA: Diagnosis not present

## 2020-01-31 ENCOUNTER — Other Ambulatory Visit: Payer: Self-pay | Admitting: Cardiology

## 2020-01-31 MED ORDER — APIXABAN 5 MG PO TABS: 5.0000 mg | ORAL_TABLET | Freq: Two times a day (BID) | ORAL | 3 refills | Status: DC

## 2020-01-31 NOTE — Telephone Encounter (Signed)
Done

## 2020-01-31 NOTE — Telephone Encounter (Signed)
  1. Which medications need to be refilled? (please list name of each medication and dose if known)   (ELIQUIS) 5 MG TABS tablet    2. Which pharmacy/location (including street and city if local pharmacy) is medication to be sent to[facility]   3. Do they need a 30 day or 90 day supply? 90   Patient is very low

## 2020-02-04 ENCOUNTER — Other Ambulatory Visit: Payer: Self-pay | Admitting: Cardiology

## 2020-02-04 MED ORDER — CARVEDILOL 6.25 MG PO TABS: 6.2500 mg | ORAL_TABLET | Freq: Two times a day (BID) | ORAL | 2 refills | Status: DC

## 2020-02-04 NOTE — Telephone Encounter (Signed)
.     1. Which medications need to be refilled? (please list name of each medication and dose if known)carvedilol (COREG) 6.25 MG tablet    2. Which pharmacy/location (including street and city if local pharmacy) is medication to be sent to    [facility]   3. Do they need a 30 day or 90 day supply?

## 2020-03-07 ENCOUNTER — Other Ambulatory Visit: Payer: Self-pay | Admitting: Cardiology

## 2020-03-07 MED ORDER — EZETIMIBE 10 MG PO TABS: 10.0000 mg | ORAL_TABLET | Freq: Every day | ORAL | 3 refills | Status: DC

## 2020-03-07 MED ORDER — FUROSEMIDE 40 MG PO TABS: ORAL_TABLET | ORAL | 3 refills | Status: DC

## 2020-03-07 NOTE — Telephone Encounter (Signed)
Medication sent to pharmacy  

## 2020-03-07 NOTE — Telephone Encounter (Signed)
     1. Which medications need to be refilled? (please list name of each medication and dose if known)   ZETIA 10MG  LASIX 40 MG   2. Which pharmacy/location (including street and city if local pharmacy) is medication to be sent to?[facility]  3. Do they need a 30 day or 90 day supply? [place]

## 2020-04-08 ENCOUNTER — Inpatient Hospital Stay (HOSPITAL_COMMUNITY)
Admission: AD | Admit: 2020-04-08 | Discharge: 2020-04-11 | DRG: 280 | Disposition: A | Discharge status: Home Health | Payer: Medicare Other | Source: Other Acute Inpatient Hospital | Attending: Internal Medicine | Admitting: Internal Medicine

## 2020-04-08 DIAGNOSIS — J449 Chronic obstructive pulmonary disease, unspecified: Secondary | ICD-10-CM | POA: Diagnosis not present

## 2020-04-08 DIAGNOSIS — Z66 Do not resuscitate: Secondary | ICD-10-CM | POA: Diagnosis present

## 2020-04-08 DIAGNOSIS — J441 Chronic obstructive pulmonary disease with (acute) exacerbation: Secondary | ICD-10-CM | POA: Diagnosis not present

## 2020-04-08 DIAGNOSIS — I509 Heart failure, unspecified: Secondary | ICD-10-CM | POA: Diagnosis not present

## 2020-04-08 DIAGNOSIS — I4891 Unspecified atrial fibrillation: Secondary | ICD-10-CM | POA: Diagnosis present

## 2020-04-08 DIAGNOSIS — Z7982 Long term (current) use of aspirin: Secondary | ICD-10-CM | POA: Diagnosis not present

## 2020-04-08 DIAGNOSIS — Z7901 Long term (current) use of anticoagulants: Secondary | ICD-10-CM | POA: Diagnosis not present

## 2020-04-08 DIAGNOSIS — N281 Cyst of kidney, acquired: Secondary | ICD-10-CM | POA: Diagnosis not present

## 2020-04-08 DIAGNOSIS — F17290 Nicotine dependence, other tobacco product, uncomplicated: Secondary | ICD-10-CM | POA: Diagnosis not present

## 2020-04-08 DIAGNOSIS — N1832 Chronic kidney disease, stage 3b: Secondary | ICD-10-CM | POA: Diagnosis present

## 2020-04-08 DIAGNOSIS — I959 Hypotension, unspecified: Secondary | ICD-10-CM | POA: Diagnosis not present

## 2020-04-08 DIAGNOSIS — Z20822 Contact with and (suspected) exposure to covid-19: Secondary | ICD-10-CM | POA: Diagnosis not present

## 2020-04-08 DIAGNOSIS — Z9119 Patient's noncompliance with other medical treatment and regimen: Secondary | ICD-10-CM | POA: Diagnosis not present

## 2020-04-08 DIAGNOSIS — I214 Non-ST elevation (NSTEMI) myocardial infarction: Secondary | ICD-10-CM | POA: Diagnosis not present

## 2020-04-08 DIAGNOSIS — C3432 Malignant neoplasm of lower lobe, left bronchus or lung: Secondary | ICD-10-CM | POA: Diagnosis present

## 2020-04-08 DIAGNOSIS — Z833 Family history of diabetes mellitus: Secondary | ICD-10-CM

## 2020-04-08 DIAGNOSIS — E785 Hyperlipidemia, unspecified: Secondary | ICD-10-CM | POA: Diagnosis present

## 2020-04-08 DIAGNOSIS — R52 Pain, unspecified: Secondary | ICD-10-CM | POA: Diagnosis not present

## 2020-04-08 DIAGNOSIS — L89151 Pressure ulcer of sacral region, stage 1: Secondary | ICD-10-CM | POA: Diagnosis present

## 2020-04-08 DIAGNOSIS — I480 Paroxysmal atrial fibrillation: Secondary | ICD-10-CM | POA: Diagnosis not present

## 2020-04-08 DIAGNOSIS — I255 Ischemic cardiomyopathy: Secondary | ICD-10-CM | POA: Diagnosis present

## 2020-04-08 DIAGNOSIS — Z8673 Personal history of transient ischemic attack (TIA), and cerebral infarction without residual deficits: Secondary | ICD-10-CM

## 2020-04-08 DIAGNOSIS — I159 Secondary hypertension, unspecified: Secondary | ICD-10-CM

## 2020-04-08 DIAGNOSIS — I5042 Chronic combined systolic (congestive) and diastolic (congestive) heart failure: Secondary | ICD-10-CM | POA: Diagnosis not present

## 2020-04-08 DIAGNOSIS — E7800 Pure hypercholesterolemia, unspecified: Secondary | ICD-10-CM | POA: Diagnosis present

## 2020-04-08 DIAGNOSIS — J9601 Acute respiratory failure with hypoxia: Secondary | ICD-10-CM | POA: Diagnosis present

## 2020-04-08 DIAGNOSIS — I2510 Atherosclerotic heart disease of native coronary artery without angina pectoris: Secondary | ICD-10-CM | POA: Diagnosis not present

## 2020-04-08 DIAGNOSIS — R069 Unspecified abnormalities of breathing: Secondary | ICD-10-CM | POA: Diagnosis not present

## 2020-04-08 DIAGNOSIS — I358 Other nonrheumatic aortic valve disorders: Secondary | ICD-10-CM | POA: Diagnosis not present

## 2020-04-08 DIAGNOSIS — I2729 Other secondary pulmonary hypertension: Secondary | ICD-10-CM | POA: Diagnosis present

## 2020-04-08 DIAGNOSIS — J189 Pneumonia, unspecified organism: Secondary | ICD-10-CM | POA: Diagnosis not present

## 2020-04-08 DIAGNOSIS — R0789 Other chest pain: Secondary | ICD-10-CM | POA: Diagnosis present

## 2020-04-08 DIAGNOSIS — I2781 Cor pulmonale (chronic): Secondary | ICD-10-CM | POA: Diagnosis present

## 2020-04-08 DIAGNOSIS — E119 Type 2 diabetes mellitus without complications: Secondary | ICD-10-CM | POA: Diagnosis not present

## 2020-04-08 DIAGNOSIS — E1122 Type 2 diabetes mellitus with diabetic chronic kidney disease: Secondary | ICD-10-CM | POA: Diagnosis not present

## 2020-04-08 DIAGNOSIS — I499 Cardiac arrhythmia, unspecified: Secondary | ICD-10-CM | POA: Diagnosis not present

## 2020-04-08 DIAGNOSIS — R778 Other specified abnormalities of plasma proteins: Secondary | ICD-10-CM

## 2020-04-08 DIAGNOSIS — Z923 Personal history of irradiation: Secondary | ICD-10-CM

## 2020-04-08 DIAGNOSIS — E039 Hypothyroidism, unspecified: Secondary | ICD-10-CM | POA: Diagnosis present

## 2020-04-08 DIAGNOSIS — I447 Left bundle-branch block, unspecified: Secondary | ICD-10-CM | POA: Diagnosis present

## 2020-04-08 DIAGNOSIS — R1111 Vomiting without nausea: Secondary | ICD-10-CM | POA: Diagnosis not present

## 2020-04-08 DIAGNOSIS — R0902 Hypoxemia: Secondary | ICD-10-CM | POA: Diagnosis not present

## 2020-04-08 DIAGNOSIS — I130 Hypertensive heart and chronic kidney disease with heart failure and stage 1 through stage 4 chronic kidney disease, or unspecified chronic kidney disease: Secondary | ICD-10-CM | POA: Diagnosis present

## 2020-04-08 DIAGNOSIS — E1151 Type 2 diabetes mellitus with diabetic peripheral angiopathy without gangrene: Secondary | ICD-10-CM | POA: Diagnosis present

## 2020-04-08 DIAGNOSIS — I10 Essential (primary) hypertension: Secondary | ICD-10-CM | POA: Diagnosis present

## 2020-04-08 DIAGNOSIS — I252 Old myocardial infarction: Secondary | ICD-10-CM | POA: Diagnosis present

## 2020-04-08 DIAGNOSIS — G4733 Obstructive sleep apnea (adult) (pediatric): Secondary | ICD-10-CM | POA: Diagnosis present

## 2020-04-08 DIAGNOSIS — Z951 Presence of aortocoronary bypass graft: Secondary | ICD-10-CM | POA: Diagnosis not present

## 2020-04-08 DIAGNOSIS — Z83438 Family history of other disorder of lipoprotein metabolism and other lipidemia: Secondary | ICD-10-CM

## 2020-04-08 DIAGNOSIS — F17210 Nicotine dependence, cigarettes, uncomplicated: Secondary | ICD-10-CM | POA: Diagnosis present

## 2020-04-08 DIAGNOSIS — L8990 Pressure ulcer of unspecified site, unspecified stage: Secondary | ICD-10-CM | POA: Insufficient documentation

## 2020-04-08 DIAGNOSIS — I5043 Acute on chronic combined systolic (congestive) and diastolic (congestive) heart failure: Secondary | ICD-10-CM | POA: Diagnosis not present

## 2020-04-08 DIAGNOSIS — I4811 Longstanding persistent atrial fibrillation: Secondary | ICD-10-CM | POA: Diagnosis present

## 2020-04-08 DIAGNOSIS — K8020 Calculus of gallbladder without cholecystitis without obstruction: Secondary | ICD-10-CM | POA: Diagnosis not present

## 2020-04-08 DIAGNOSIS — I5023 Acute on chronic systolic (congestive) heart failure: Secondary | ICD-10-CM | POA: Diagnosis present

## 2020-04-08 DIAGNOSIS — Z8249 Family history of ischemic heart disease and other diseases of the circulatory system: Secondary | ICD-10-CM

## 2020-04-08 DIAGNOSIS — K5790 Diverticulosis of intestine, part unspecified, without perforation or abscess without bleeding: Secondary | ICD-10-CM | POA: Diagnosis not present

## 2020-04-08 LAB — URINALYSIS, ROUTINE W REFLEX MICROSCOPIC
Bilirubin Urine: NEGATIVE
Glucose, UA: 50 mg/dL — AB
Ketones, ur: NEGATIVE mg/dL
Nitrite: NEGATIVE
Protein, ur: 100 mg/dL — AB
RBC / HPF: >50 RBC/hpf — ABNORMAL HIGH (ref 0–5)
Specific Gravity, Urine: 1.018 (ref 1.005–1.030)
WBC, UA: >50 WBC/hpf — ABNORMAL HIGH (ref 0–5)
pH: 5 (ref 5.0–8.0)

## 2020-04-08 LAB — GLUCOSE, CAPILLARY: Glucose-Capillary: 222 mg/dL — ABNORMAL HIGH (ref 70–99)

## 2020-04-08 LAB — TROPONIN I (HIGH SENSITIVITY): Troponin I (High Sensitivity): 8033 ng/L (ref <18)

## 2020-04-08 MED ORDER — APIXABAN 5 MG PO TABS
5.0000 mg | ORAL_TABLET | Freq: Two times a day (BID) | ORAL | Status: DC
  Administered 2020-04-09 – 2020-04-10 (×3): 5 mg via ORAL
  Filled 2020-04-08 (×3): qty 1

## 2020-04-08 MED ORDER — SODIUM CHLORIDE 0.9% FLUSH: 3.0000 mL | INTRAVENOUS | Status: DC | PRN

## 2020-04-08 MED ORDER — ACETAMINOPHEN 325 MG PO TABS
650.0000 mg | ORAL_TABLET | Freq: Four times a day (QID) | ORAL | Status: DC | PRN
  Administered 2020-04-10: 650 mg via ORAL
  Filled 2020-04-08: qty 2

## 2020-04-08 MED ORDER — CARVEDILOL 6.25 MG PO TABS
6.2500 mg | ORAL_TABLET | Freq: Two times a day (BID) | ORAL | Status: DC
  Administered 2020-04-09 – 2020-04-11 (×6): 6.25 mg via ORAL
  Filled 2020-04-08 (×6): qty 1

## 2020-04-08 MED ORDER — INSULIN ASPART 100 UNIT/ML ~~LOC~~ SOLN
0.0000 [IU] | Freq: Every day | SUBCUTANEOUS | Status: DC
  Administered 2020-04-08: 2 [IU] via SUBCUTANEOUS

## 2020-04-08 MED ORDER — ACETAMINOPHEN 650 MG RE SUPP: 650.0000 mg | Freq: Four times a day (QID) | RECTAL | Status: DC | PRN

## 2020-04-08 MED ORDER — ONDANSETRON HCL 4 MG/2ML IJ SOLN: 4.0000 mg | Freq: Four times a day (QID) | INTRAMUSCULAR | Status: DC | PRN

## 2020-04-08 MED ORDER — ONDANSETRON HCL 4 MG PO TABS: 4.0000 mg | ORAL_TABLET | Freq: Four times a day (QID) | ORAL | Status: DC | PRN

## 2020-04-08 MED ORDER — INSULIN ASPART 100 UNIT/ML ~~LOC~~ SOLN
0.0000 [IU] | Freq: Three times a day (TID) | SUBCUTANEOUS | Status: DC
  Administered 2020-04-09: 2 [IU] via SUBCUTANEOUS
  Administered 2020-04-09: 1 [IU] via SUBCUTANEOUS
  Administered 2020-04-09: 2 [IU] via SUBCUTANEOUS
  Administered 2020-04-10: 1 [IU] via SUBCUTANEOUS
  Administered 2020-04-10: 2 [IU] via SUBCUTANEOUS
  Administered 2020-04-10 – 2020-04-11 (×2): 1 [IU] via SUBCUTANEOUS

## 2020-04-08 MED ORDER — SODIUM CHLORIDE 0.9 % IV SOLN: 250.0000 mL | INTRAVENOUS | Status: DC | PRN

## 2020-04-08 MED ORDER — EZETIMIBE 10 MG PO TABS
10.0000 mg | ORAL_TABLET | Freq: Every day | ORAL | Status: DC
  Administered 2020-04-09 – 2020-04-11 (×3): 10 mg via ORAL
  Filled 2020-04-08 (×3): qty 1

## 2020-04-08 MED ORDER — HYDROCODONE-ACETAMINOPHEN 5-325 MG PO TABS: 1.0000 | ORAL_TABLET | ORAL | Status: DC | PRN

## 2020-04-08 MED ORDER — SODIUM CHLORIDE 0.9% FLUSH
3.0000 mL | Freq: Two times a day (BID) | INTRAVENOUS | Status: DC
  Administered 2020-04-09 – 2020-04-10 (×2): 3 mL via INTRAVENOUS

## 2020-04-08 MED ORDER — ASPIRIN EC 81 MG PO TBEC
81.0000 mg | DELAYED_RELEASE_TABLET | Freq: Every day | ORAL | Status: DC
  Administered 2020-04-09 – 2020-04-10 (×2): 81 mg via ORAL
  Filled 2020-04-08 (×2): qty 1

## 2020-04-08 MED ORDER — SODIUM CHLORIDE 0.9% FLUSH
3.0000 mL | Freq: Two times a day (BID) | INTRAVENOUS | Status: DC
  Administered 2020-04-08 – 2020-04-10 (×3): 3 mL via INTRAVENOUS

## 2020-04-08 MED ORDER — DOCUSATE SODIUM 100 MG PO CAPS
100.0000 mg | ORAL_CAPSULE | Freq: Two times a day (BID) | ORAL | Status: DC
  Administered 2020-04-08 – 2020-04-11 (×6): 100 mg via ORAL
  Filled 2020-04-08 (×6): qty 1

## 2020-04-08 NOTE — H&P (Addendum)
Gary Odonnell SVX:[id number] DOB: 13-Mar-1932 DOA: 04/08/2020     PCP: Glenda Chroman, MD   Outpatient Specialists:   CARDS:  Sherle Poe, MD  Urology Dr.  Louis Meckel  Patient arrived to ER on  at   Patient coming from: home Lives With family   Chief Complaint: fall HPI: Gary Odonnell is a 86 y.o. male with medical history significant of  HTN, COPD, CAD/PVD s/p CABG 2003,  HFrEF EF 35%,  Afib, lung cancer s/p radiation, PAD, HLD, CVA    Presented with  Fall he set up to pee today his feet slipped and he landed on his bottom, could not get up. His family came and got him up.Denies any chest pain. After his fall he had nausea and vomiting. Wife though he was breathing hard. Denies head injury to me but did state so earlier. Pt was seen at [place] showed RLL infiltrate, A. Fib with T-wave inversions. BNP elevated >10,000 Trop 5-7. Pt has hx of COPd but has not been on oxygen in months. He is on Eliquis for hx of A.fib   Infectious risk factors:  Reports shortness of breath,  N/V   Has NOt been vaccinated against COVID (un sure)  In  ER   COVID TEST  NEGATIVE   No results found for: SARSCOV2NAA   Regarding pertinent Chronic problems:    Hyperlipidemia -  on statins   HTN on coreg   chronic CHF diastolic/systolic/ combined - last echo 12/2018 echo LVEF 30-35% On lasix    CAD  - On Aspirin, statin, betablocker, Plavix                 - *followed by cardiology                - last cardiac cath  09/2013 showed LM 30%, LAD 100% proximal, LCX 50% ostial, RCA 100% mid with distal vessel filling with right to right and left to right collaterals. LIMA-LAD patent, graft from [place] to OM1, OM2, PDA occluded. No PCI targets, recommendations for medical management. RHC mean PA 43, wedge 22, CI 1.74.    DM 2 -  Lab Results  Component Value Date   HGBA1C 6.6 (H) 12/21/2019    diet controlled    COPD - not  followed by pulmonology no longer on baseline oxygen       OSA -on nocturnal oxygen, *CPAP, *noncompliant with CPAP    Hx of CVA -  with/out residual deficits    A. Fib -  - CHA2DS2 vas score >3 :  current  on anticoagulation with   Eliquis,           -  Rate control:   Coreg       CKD stage III - baseline Cr 1.3 Lab Results  Component Value Date   CREATININE 1.31 (H) 12/21/2019   CREATININE 1.49 (H) 09/17/2019   CREATININE 1.54 (H) 07/29/2019     While in ER: initially was hypoxic was started on 2L O2 Initially was slightly hypotensive and was given IVF  Noted to have elevated troponin and BNP ECG non acute CT head,CT abd unremarkable  Pt was [facility] to [facility] on heparin drip Cardiology ws called for consult rec transfer to [facility] no plavix at this time  Hospitalist was called for trasfer for NSTEMI  The following Work up has been ordered so far:     Significant initial  Findings: Abnormal Labs Reviewed  TROPONIN I (  HIGH SENSITIVITY) - Abnormal; Notable for the following components:      Result Value   Troponin I (High Sensitivity) 8,033 (*)    All other components within normal limits   Otherwise labs showing:   WBC 10 Cr    Up from baseline see below up to 1.69 Lab Results  Component Value Date   CREATININE 1.31 (H) 12/21/2019   CREATININE 1.49 (H) 09/17/2019   CREATININE 1.54 (H) 07/29/2019     Troponin 8000     ECG: Ordered Personally reviewed by me showing: HR : 61 Rhythm:  A.fib.  Left fascicular block  no evidence of ischemic changes QTC 487   BNP >10,000 DM  labs:  HbA1C: Recent Labs    12/21/19 1054  HGBA1C 6.6*     UA  ordered      Ordered  CT HEAD  NON acute  CXR - mild fluid overload  CTabd/pelvis -  nonacute    ED Triage Vitals [04/08/20 2006]  Enc Vitals Group     BP 116/68     Pulse Rate 72     Resp      Temp 98.8 F (37.1 C)     Temp Source Oral     SpO2      Weight 199 lb 15.3 oz (90.7 kg)     Height      Head Circumference      Peak Flow      Pain Score      Pain  Loc      Pain Edu?      Excl. in [place]?   HQPR(91)@       Latest  Blood pressure 116/68, pulse 72, temperature 98.8 F (37.1 C), temperature source Oral, weight 90.7 kg.   Review of Systems:    Pertinent positives include:  fatigue, nausea, vomiting,  Constitutional:  No weight loss, night sweats, Fevers, chills,  weight loss  HEENT:  No headaches, Difficulty swallowing,Tooth/dental problems,Sore throat,  No sneezing, itching, ear ache, nasal congestion, post nasal drip,  Cardio-vascular:  No chest pain, Orthopnea, PND, anasarca, dizziness, palpitations.no Bilateral lower extremity swelling  GI:  No heartburn, indigestion, abdominal pain, diarrhea, change in bowel habits, loss of appetite, melena, blood in stool, hematemesis Resp:  no shortness of breath at rest. No dyspnea on exertion, No excess mucus, no productive cough, No non-productive cough, No coughing up of blood.No change in color of mucus.No wheezing. Skin:  no rash or lesions. No jaundice GU:  no dysuria, change in color of urine, no urgency or frequency. No straining to urinate.  No flank pain.  Musculoskeletal:  No joint pain or no joint swelling. No decreased range of motion. No back pain.  Psych:  No change in mood or affect. No depression or anxiety. No memory loss.  Neuro: no localizing neurological complaints, no tingling, no weakness, no double vision, no gait abnormality, no slurred speech, no confusion  All systems reviewed and apart from [place] all are negative  Past Medical History:   Past Medical History:  Diagnosis Date  . Cancer ([place])    L lung  . CHF (congestive heart failure) ([place])    a. EF 40-45% in 2015, at EF 30-35% by echo in 12/2018  . Coronary artery disease    a. s/p CABG with most recent cath in 2014 showing patent LIMA-LAD, occluded Radial to OM1-OM2-PDA and occluded RCA with L--> R collaterals  . Hypercholesteremia   . Hypertension   . PAD (peripheral artery  disease) ([place])   .  Pneumonia 09/2013  . Radiation april 2016  . Solitary pulmonary nodule on lung CT 12/13/13   11/15/14  . Stroke [facility]) 11/2008     Past Surgical History:  Procedure Laterality Date  . Angioplasty to Left femoral artery    . CARPAL TUNNEL RELEASE Left 05/11/2014   Procedure: LEFT CARPAL TUNNEL RELEASE;  Surgeon: Carole Civil, MD;  Location: [facility] ORS;  Service: Orthopedics;  Laterality: Left;  . COLONOSCOPY N/A 01/24/2015   Procedure: COLONOSCOPY;  Surgeon: Danie Binder, MD;  Location: [facility] ENDO SUITE;  Service: Endoscopy;  Laterality: N/A;  130  . CORONARY ARTERY BYPASS GRAFT     x4  . FEMORAL-POPLITEAL BYPASS GRAFT Right 10/23/2015   Procedure: BYPASS GRAFT FEMORAL TO BELOW KNEE POPLITEAL ARTERY USING RIGHT NON-REVERSED GREATER SAPPHENOUS VEIN;  Surgeon: [name], MD;  Location: [place];  Service: Vascular;  Laterality: Right;  . INTRAOPERATIVE ARTERIOGRAM Right 10/23/2015   Procedure: INTRA OPERATIVE ARTERIOGRAM - RIGHT LOWER LEG;  Surgeon: [name], MD;  Location: [place];  Service: Vascular;  Laterality: Right;  . KNEE ARTHROSCOPY Right   . LEFT AND RIGHT HEART CATHETERIZATION WITH CORONARY ANGIOGRAM N/A 09/29/2013   Procedure: LEFT AND RIGHT HEART CATHETERIZATION WITH CORONARY ANGIOGRAM;  Surgeon: Burnell Blanks, MD;  Location: [facility] CATH LAB;  Service: Cardiovascular;  Laterality: N/A;  . PERIPHERAL VASCULAR CATHETERIZATION N/A 04/21/2015   Procedure: Abdominal Aortogram w/Lower Extremity;  Surgeon: [name], MD;  Location: [place] CV LAB;  Service: Cardiovascular;  Laterality: N/A;  . Stent to right femoral artery    . TRIGGER FINGER RELEASE Bilateral   . VEIN HARVEST Right 10/23/2015   Procedure: [place];  Surgeon: [name], MD;  Location: [place];  Service: Vascular;  Laterality: Right;    Social History:  Ambulatory  cane, walker       reports that he has been smoking cigars and cigarettes. He started smoking  about 70 years ago. He has a 30.00 pack-year smoking history. He has never used smokeless tobacco. He reports that he does not drink alcohol or use drugs.  Family History:   Family History  Problem Relation Age of Onset  . Heart attack Father   . Hyperlipidemia Father   . Diabetes Mother   . Diabetes Brother   . Colon cancer Neg Hx     Allergies: No Known Allergies   Prior to Admission medications   Medication Sig Start Date End Date Taking? Authorizing Provider  apixaban (ELIQUIS) 5 MG TABS tablet Take 1 tablet (5 mg total) by mouth 2 (two) times daily. 01/31/20   Arnoldo Lenis, MD  Carboxymethylcellulose Sodium (EYE DROPS OP) Apply 1 drop to eye daily as needed (dry eyes).     [provider]  carvedilol (COREG) 6.25 MG tablet Take 1 tablet (6.25 mg total) by mouth 2 (two) times daily with a meal. 02/04/20   Branch, Alphonse Guild, MD  ezetimibe (ZETIA) 10 MG tablet Take 1 tablet (10 mg total) by mouth daily. 03/07/20   Arnoldo Lenis, MD  furosemide (LASIX) 40 MG tablet Take one tab (40mg ) by mouth every other day alternating with 1 1/2 tabs (60mg ) every other day. 03/07/20   Arnoldo Lenis, MD  potassium chloride SA (K-DUR,KLOR-CON) 20 MEQ tablet Take 1 tablet (20 mEq total) by mouth daily for 30 days. 02/12/19 07/27/20  Arnoldo Lenis, MD  sacubitril-valsartan (ENTRESTO) 49-51 MG  Take 1 tablet by mouth 2 (two) times daily. 11/05/19   Arnoldo Lenis, MD   Physical Exam: Blood pressure 116/68, pulse 72, temperature 98.8 F (37.1 C), temperature source Oral, weight 90.7 kg. 1. General:  in No Acute distress  Chronically ill -appearing 2. Psychological: Alert and Oriented 3. Head/ENT:   Moist Mucous Membranes                          Head Non traumatic, neck supple                           Poor Dentition 4. SKIN: normal Skin turgor,  Skin clean Dry and intact no rash 5. Heart: Regular rate and rhythm systolic  Murmur, no Rub or gallop 6. Lungs:   no wheezes or  crackles   7. Abdomen: Soft,  non-tender, Non distended  bowel sounds present 8. Lower extremities: no clubbing, cyanosis, no  edema 9. Neurologically Grossly intact, moving all 4 extremities equally  10. MSK: Normal range of motion  All other LABS:    No results for input(s): WBC, NEUTROABS, HGB, HCT, MCV, PLT in the last 168 hours.   No results for input(s): NA, K, CL, CO2, GLUCOSE, BUN, CREATININE, CALCIUM, MG, PHOS in the last 168 hours.   No results for input(s): AST, ALT, ALKPHOS, BILITOT, PROT, ALBUMIN in the last 168 hours.     Cultures:    Component Value Date/Time   SDES LEFT ANTECUBITAL 01/10/2019 0136   SPECREQUEST  01/10/2019 0136    BOTTLES DRAWN AEROBIC AND ANAEROBIC Blood Culture adequate volume   CULT  01/10/2019 0136    NO GROWTH 5 DAYS Performed at [facility], [address], [place], [place] [address]    REPTSTATUS 01/15/2019 FINAL 01/10/2019 0136     Radiological Exams on Admission: No results found.  Chart has been reviewed  Assessment/Plan   86 y.o. male with medical history significant of  HTN, COPD, CAD/PVD s/p CABG 2003,  HFrEF EF 35%,  Afib, lung cancer s/p radiation, PAD, HLD, CVA Admitted for NSTEMI  Present on Admission: . NSTEMI (non-ST elevated myocardial infarction) [facility]) - discussed with cardiology will see in consult continue Aspirin and Eliquis hold off on heparin since already on Eliquis, hold off on Plavix No Chest pain or SOB no ECG changes at this time Will continue to follow troponin and obtain EChO  . HTN (hypertension)  - cont coreg for now, hold entresto given inc in cr  . Chronic combined systolic and diastolic CHF (congestive heart failure) (H[facility]) - difficult to assess fluid status pt initially was hypotensive was given IV fluids, foely was placed showing small amount of dark urine, noted to have slightly elevated cr up from baseline,pt have had his lasix increased recently CXR was showing possible mild vascular congestion No  peripheral edema Soft BP still Hold off on further diuretics for tonight and will need to reassess in AM   . CAD (coronary atherosclerotic disease) known hx of CAd, medical management at this time Appreciate cardiology consult  . Primary cancer of left lower lobe of lung ([place]) - chronic, stable Pt states he has not seen oncology for years. CT abd w/o contrast done showed mild pleural effusion with atelectasis but did not confirm RLL infiltrate May need further imaging of the chest in the future to reassess   . A-fib ([place]) -           -  CHA2DS2 vas score 6 : continue current anticoagulation with   Eliquis,         -  Rate control:  Currently controlled with Coreg will continue   DM2 -  - Order Sensitive SSI   -  check TSH and HgA1C     intially abnormal CXR no evidence of PNA clinically    Other plan as per orders.  DVT prophylaxis:  Eliquis  Code Status:   DNR/DNI  as per patient  I had personally discussed CODE STATUS with patient   Family Communication:   Family not at  Bedside   Disposition Plan:                               To home once workup is complete and patient is stable    Following barriers for discharge:                           Will likely need home health, home O2, set up                           Will need consultants to evaluate patient prior to discharge                       Would benefit from PT/OT eval prior to DC  Ordered                   Swallow eval - SLP ordered                                        Consults called: cardiology aware will see pt in consult   Admission status:  ED Disposition    None      Status is: Inpatient     Dispo: The patient is from: Home              Anticipated d/c is to: Home              Anticipated d/c date is: 2 days              Patient currently is not medically stable to d/c.   Obs       Level of care    SDU tele indefinitely please discontinue once patient no longer qualifies    Precautions: admitted as Covid Negative  No active isolations    PPE: Used by the provider:   P100  eye Goggles,  Gloves     Samar Venneman 04/08/2020, 10:43 PM    [facility]     after 2 AM please page floor coverage PA If 7AM-7PM, please contact the day team taking care of the patient using [email or web address]   Patient was evaluated in the context of the global COVID-19 pandemic, which necessitated consideration that the patient might be at risk for infection with the SARS-CoV-2 virus that causes COVID-19. Institutional protocols and algorithms that pertain to the evaluation of patients at risk for COVID-19 are in a state of rapid change based on information released by regulatory bodies including the CDC and federal and state organizations. These policies and algorithms were followed during the patient's care.

## 2020-04-09 ENCOUNTER — Observation Stay (HOSPITAL_COMMUNITY): Payer: Medicare Other

## 2020-04-09 DIAGNOSIS — I130 Hypertensive heart and chronic kidney disease with heart failure and stage 1 through stage 4 chronic kidney disease, or unspecified chronic kidney disease: Secondary | ICD-10-CM | POA: Diagnosis not present

## 2020-04-09 DIAGNOSIS — I959 Hypotension, unspecified: Secondary | ICD-10-CM | POA: Diagnosis not present

## 2020-04-09 DIAGNOSIS — I2781 Cor pulmonale (chronic): Secondary | ICD-10-CM | POA: Diagnosis present

## 2020-04-09 DIAGNOSIS — E119 Type 2 diabetes mellitus without complications: Secondary | ICD-10-CM | POA: Diagnosis not present

## 2020-04-09 DIAGNOSIS — Z720 Tobacco use: Secondary | ICD-10-CM | POA: Diagnosis not present

## 2020-04-09 DIAGNOSIS — J441 Chronic obstructive pulmonary disease with (acute) exacerbation: Secondary | ICD-10-CM | POA: Diagnosis not present

## 2020-04-09 DIAGNOSIS — I214 Non-ST elevation (NSTEMI) myocardial infarction: Principal | ICD-10-CM

## 2020-04-09 DIAGNOSIS — Z66 Do not resuscitate: Secondary | ICD-10-CM | POA: Diagnosis not present

## 2020-04-09 DIAGNOSIS — N183 Chronic kidney disease, stage 3 unspecified: Secondary | ICD-10-CM | POA: Diagnosis not present

## 2020-04-09 DIAGNOSIS — I159 Secondary hypertension, unspecified: Secondary | ICD-10-CM | POA: Diagnosis not present

## 2020-04-09 DIAGNOSIS — F17290 Nicotine dependence, other tobacco product, uncomplicated: Secondary | ICD-10-CM | POA: Diagnosis present

## 2020-04-09 DIAGNOSIS — E039 Hypothyroidism, unspecified: Secondary | ICD-10-CM | POA: Diagnosis present

## 2020-04-09 DIAGNOSIS — L89151 Pressure ulcer of sacral region, stage 1: Secondary | ICD-10-CM | POA: Diagnosis present

## 2020-04-09 DIAGNOSIS — I358 Other nonrheumatic aortic valve disorders: Secondary | ICD-10-CM | POA: Diagnosis present

## 2020-04-09 DIAGNOSIS — I340 Nonrheumatic mitral (valve) insufficiency: Secondary | ICD-10-CM | POA: Diagnosis not present

## 2020-04-09 DIAGNOSIS — I361 Nonrheumatic tricuspid (valve) insufficiency: Secondary | ICD-10-CM | POA: Diagnosis not present

## 2020-04-09 DIAGNOSIS — I447 Left bundle-branch block, unspecified: Secondary | ICD-10-CM | POA: Diagnosis present

## 2020-04-09 DIAGNOSIS — I2510 Atherosclerotic heart disease of native coronary artery without angina pectoris: Secondary | ICD-10-CM | POA: Diagnosis not present

## 2020-04-09 DIAGNOSIS — R0789 Other chest pain: Secondary | ICD-10-CM | POA: Diagnosis not present

## 2020-04-09 DIAGNOSIS — N1832 Chronic kidney disease, stage 3b: Secondary | ICD-10-CM | POA: Diagnosis not present

## 2020-04-09 DIAGNOSIS — I5043 Acute on chronic combined systolic (congestive) and diastolic (congestive) heart failure: Secondary | ICD-10-CM | POA: Diagnosis not present

## 2020-04-09 DIAGNOSIS — I5041 Acute combined systolic (congestive) and diastolic (congestive) heart failure: Secondary | ICD-10-CM | POA: Diagnosis not present

## 2020-04-09 DIAGNOSIS — E7800 Pure hypercholesterolemia, unspecified: Secondary | ICD-10-CM | POA: Diagnosis present

## 2020-04-09 DIAGNOSIS — J9601 Acute respiratory failure with hypoxia: Secondary | ICD-10-CM | POA: Diagnosis not present

## 2020-04-09 DIAGNOSIS — I4811 Longstanding persistent atrial fibrillation: Secondary | ICD-10-CM | POA: Diagnosis not present

## 2020-04-09 DIAGNOSIS — J449 Chronic obstructive pulmonary disease, unspecified: Secondary | ICD-10-CM | POA: Diagnosis not present

## 2020-04-09 DIAGNOSIS — Z951 Presence of aortocoronary bypass graft: Secondary | ICD-10-CM | POA: Diagnosis not present

## 2020-04-09 DIAGNOSIS — E1122 Type 2 diabetes mellitus with diabetic chronic kidney disease: Secondary | ICD-10-CM | POA: Diagnosis not present

## 2020-04-09 DIAGNOSIS — I5023 Acute on chronic systolic (congestive) heart failure: Secondary | ICD-10-CM

## 2020-04-09 DIAGNOSIS — E785 Hyperlipidemia, unspecified: Secondary | ICD-10-CM | POA: Diagnosis present

## 2020-04-09 DIAGNOSIS — I482 Chronic atrial fibrillation, unspecified: Secondary | ICD-10-CM

## 2020-04-09 DIAGNOSIS — Z9119 Patient's noncompliance with other medical treatment and regimen: Secondary | ICD-10-CM | POA: Diagnosis not present

## 2020-04-09 DIAGNOSIS — E1151 Type 2 diabetes mellitus with diabetic peripheral angiopathy without gangrene: Secondary | ICD-10-CM | POA: Diagnosis present

## 2020-04-09 DIAGNOSIS — G4733 Obstructive sleep apnea (adult) (pediatric): Secondary | ICD-10-CM | POA: Diagnosis present

## 2020-04-09 DIAGNOSIS — C3432 Malignant neoplasm of lower lobe, left bronchus or lung: Secondary | ICD-10-CM | POA: Diagnosis not present

## 2020-04-09 LAB — COMPREHENSIVE METABOLIC PANEL
ALT: 22 U/L (ref 0–44)
AST: 46 U/L — ABNORMAL HIGH (ref 15–41)
Albumin: 3.3 g/dL — ABNORMAL LOW (ref 3.5–5.0)
Alkaline Phosphatase: 44 U/L (ref 38–126)
Anion gap: 8 (ref 5–15)
BUN: 36 mg/dL — ABNORMAL HIGH (ref 8–23)
CO2: 28 mmol/L (ref 22–32)
Calcium: 9.3 mg/dL (ref 8.9–10.3)
Chloride: 106 mmol/L (ref 98–111)
Creatinine, Ser: 1.69 mg/dL — ABNORMAL HIGH (ref 0.61–1.24)
GFR calc Af Amer: 42 mL/min — ABNORMAL LOW (ref >60)
GFR calc non Af Amer: 36 mL/min — ABNORMAL LOW (ref >60)
Glucose, Bld: 189 mg/dL — ABNORMAL HIGH (ref 70–99)
Potassium: 4.8 mmol/L (ref 3.5–5.1)
Sodium: 142 mmol/L (ref 135–145)
Total Bilirubin: 1.1 mg/dL (ref 0.3–1.2)
Total Protein: 6.3 g/dL — ABNORMAL LOW (ref 6.5–8.1)

## 2020-04-09 LAB — CBC WITH DIFFERENTIAL/PLATELET
Abs Immature Granulocytes: 0.12 10*3/uL — ABNORMAL HIGH (ref 0.00–0.07)
Basophils Absolute: 0 10*3/uL (ref 0.0–0.1)
Basophils Relative: 0 %
Eosinophils Absolute: 0 10*3/uL (ref 0.0–0.5)
Eosinophils Relative: 0 %
HCT: 36.5 % — ABNORMAL LOW (ref 39.0–52.0)
Hemoglobin: 11.4 g/dL — ABNORMAL LOW (ref 13.0–17.0)
Immature Granulocytes: 1 %
Lymphocytes Relative: 5 %
Lymphs Abs: 0.8 10*3/uL (ref 0.7–4.0)
MCH: 31.1 pg (ref 26.0–34.0)
MCHC: 31.2 g/dL (ref 30.0–36.0)
MCV: 99.7 fL (ref 80.0–100.0)
Monocytes Absolute: 0.5 10*3/uL (ref 0.1–1.0)
Monocytes Relative: 3 %
Neutro Abs: 15.2 10*3/uL — ABNORMAL HIGH (ref 1.7–7.7)
Neutrophils Relative %: 91 %
Platelets: 122 10*3/uL — ABNORMAL LOW (ref 150–400)
RBC: 3.66 MIL/uL — ABNORMAL LOW (ref 4.22–5.81)
RDW: 13.7 % (ref 11.5–15.5)
WBC: 16.6 10*3/uL — ABNORMAL HIGH (ref 4.0–10.5)
nRBC: 0 % (ref 0.0–0.2)

## 2020-04-09 LAB — SODIUM, URINE, RANDOM: Sodium, Ur: 14 mmol/L

## 2020-04-09 LAB — ECHOCARDIOGRAM COMPLETE: Weight: 3199.32 oz

## 2020-04-09 LAB — TROPONIN I (HIGH SENSITIVITY)
Troponin I (High Sensitivity): 7420 ng/L (ref <18)
Troponin I (High Sensitivity): 7355 ng/L (ref <18)
Troponin I (High Sensitivity): 7107 ng/L (ref <18)

## 2020-04-09 LAB — GLUCOSE, CAPILLARY
Glucose-Capillary: 162 mg/dL — ABNORMAL HIGH (ref 70–99)
Glucose-Capillary: 156 mg/dL — ABNORMAL HIGH (ref 70–99)
Glucose-Capillary: 128 mg/dL — ABNORMAL HIGH (ref 70–99)
Glucose-Capillary: 134 mg/dL — ABNORMAL HIGH (ref 70–99)

## 2020-04-09 LAB — HEMOGLOBIN A1C
Hgb A1c MFr Bld: 7 % — ABNORMAL HIGH (ref 4.8–5.6)
Mean Plasma Glucose: 154.2 mg/dL

## 2020-04-09 LAB — MAGNESIUM: Magnesium: 2 mg/dL (ref 1.7–2.4)

## 2020-04-09 LAB — BRAIN NATRIURETIC PEPTIDE: B Natriuretic Peptide: 2748.2 pg/mL — ABNORMAL HIGH (ref 0.0–100.0)

## 2020-04-09 LAB — CK: Total CK: 351 U/L (ref 49–397)

## 2020-04-09 LAB — LACTIC ACID, PLASMA: Lactic Acid, Venous: 2.1 mmol/L (ref 0.5–1.9)

## 2020-04-09 LAB — MRSA PCR SCREENING: MRSA by PCR: NEGATIVE

## 2020-04-09 LAB — PHOSPHORUS: Phosphorus: 3.3 mg/dL (ref 2.5–4.6)

## 2020-04-09 LAB — CREATININE, URINE, RANDOM: Creatinine, Urine: 154.4 mg/dL

## 2020-04-09 LAB — TSH: TSH: 5.414 u[IU]/mL — ABNORMAL HIGH (ref 0.350–4.500)

## 2020-04-09 MED ORDER — CHLORHEXIDINE GLUCONATE CLOTH 2 % EX PADS: 6.0000 | MEDICATED_PAD | Freq: Every day | CUTANEOUS | Status: DC

## 2020-04-09 MED ORDER — FLUTICASONE FUROATE-VILANTEROL 100-25 MCG/INH IN AEPB
1.0000 | INHALATION_SPRAY | Freq: Every day | RESPIRATORY_TRACT | Status: DC
  Administered 2020-04-09 – 2020-04-11 (×3): 1 via RESPIRATORY_TRACT
  Filled 2020-04-09: qty 28

## 2020-04-09 MED ORDER — IPRATROPIUM-ALBUTEROL 0.5-2.5 (3) MG/3ML IN SOLN
3.0000 mL | Freq: Four times a day (QID) | RESPIRATORY_TRACT | Status: DC
  Administered 2020-04-09 – 2020-04-10 (×4): 3 mL via RESPIRATORY_TRACT
  Filled 2020-04-09 (×4): qty 3

## 2020-04-09 MED ORDER — GUAIFENESIN ER 600 MG PO TB12
600.0000 mg | ORAL_TABLET | Freq: Two times a day (BID) | ORAL | Status: DC
  Administered 2020-04-09 – 2020-04-11 (×4): 600 mg via ORAL
  Filled 2020-04-09 (×6): qty 1

## 2020-04-09 MED ORDER — HEPARIN SOD (PORCINE) IN D5W 100 UNIT/ML IV SOLN
12.00 | INTRAVENOUS | Status: DC
Start: ? — End: 2020-04-09

## 2020-04-09 NOTE — Evaluation (Signed)
Physical Therapy Evaluation Patient Details Name: Gary Odonnell MRN: [id number] DOB: 1932/11/12 Today's Date: 04/09/2020   History of Present Illness  86 y.o. male with medical history significant of  HTN, COPD, CAD/PVD s/p CABG 2003,  HFrEF EF 35%,  Afib, lung cancer s/p radiation, PAD, HLD, CVA. [facility] where[facility] showed RLL infiltrate, A. Fib with T-wave inversions. BNP elevated and troponins increasing. Transfered to [facility] for treatment 04/08/20.  Clinical Impression  PTA pt living with wife in single story home with ramped entrance. Pt reports ambulating with cane around house, and using a RW for longer distance. Pt also reports independence with ADLs and iADLs, enjoys mowing his lawn. Pt is limited in safe mobility by decreased balance and endurance. Pt is min guard for bed mobility, transfers and ambulation of 100 feet with RW. Pt with increased UE support and flexed posture as ambulation progressed. PT recommending HHPT for improving balance with mobility to reduce fall risk. PT will continue to follow acutely.     Follow Up Recommendations Home health PT;Supervision for mobility/OOB    Equipment Recommendations  None recommended by PT(pt has necessary DME)       Precautions / Restrictions Precautions Precautions: Fall Precaution Comments: came in as a result of a fall Restrictions Weight Bearing Restrictions: No      Mobility  Bed Mobility Overal bed mobility: Needs Assistance Bed Mobility: Supine to Sit     Supine to sit: Min guard     General bed mobility comments: min guard for safety  Transfers Overall transfer level: Needs assistance Equipment used: None Transfers: Sit to/from Stand Sit to Stand: Min guard         General transfer comment: min guard for safety, able to static stand prior to reaching for RW  Ambulation/Gait Ambulation/Gait assistance: Min guard Gait Distance (Feet): 100 Feet Assistive device: Rolling walker (2  wheeled) Gait Pattern/deviations: Step-through pattern;Decreased step length - right;Decreased step length - left;Trunk flexed;Decreased stride length Gait velocity: slowed Gait velocity interpretation: 1.31 - 2.62 ft/sec, indicative of limited community ambulator General Gait Details: min guard to hands on min guard for safety, vc for proximity to RW as pt with increased forward flexion as ambulation progressed likely due to increased back pain       Balance Overall balance assessment: Needs assistance Sitting-balance support: Feet supported;No upper extremity supported Sitting balance-Leahy Scale: Good     Standing balance support: No upper extremity supported;During functional activity Standing balance-Leahy Scale: Fair Standing balance comment: able to static stand needs UE support for dynamic balance                              Pertinent Vitals/Pain Pain Assessment: Faces Faces Pain Scale: Hurts little more Pain Location: low back with ambulation  Pain Descriptors / Indicators: Grimacing Pain Intervention(s): Limited activity within patient's tolerance;Monitored during session;Repositioned    Home Living Family/patient expects to be discharged to:: Private residence Living Arrangements: Spouse/significant other Available Help at Discharge: Family;Available 24 hours/day Type of Home: House Home Access: Ramped entrance     Home Layout: One level Home Equipment: Walker - 2 wheels;Wheelchair - manual;Cane - single point;Grab bars - tub/shower      Prior Function Level of Independence: Independent with assistive device(s)         Comments: uses cane for short distance walking, uses RW for long distance in store, still mows grass, plumbing  Extremity/Trunk Assessment   Upper Extremity Assessment Upper Extremity Assessment: Defer to OT evaluation    Lower Extremity Assessment Lower Extremity Assessment: Generalized weakness    Cervical /  Trunk Assessment Cervical / Trunk Assessment: Kyphotic  Communication   Communication: No difficulties  Cognition Arousal/Alertness: Awake/alert Behavior During Therapy: WFL for tasks assessed/performed Overall Cognitive Status: Within Functional Limits for tasks assessed                                        General Comments General comments (skin integrity, edema, etc.): VSS on RA, SaO2 dropped to mid 80s with poor pleth wave form, pulse ox sensor adjusted when seated and read 100%O2 with stong wave form, assisted pt with ordering dinner        Assessment/Plan    PT Assessment Patient needs continued PT services  PT Problem List Decreased strength;Decreased balance;Decreased mobility;Decreased activity tolerance;Decreased safety awareness;Cardiopulmonary status limiting activity       PT Treatment Interventions DME instruction;Gait training;Functional mobility training;Therapeutic activities;Therapeutic exercise;Balance training;Cognitive remediation;Patient/family education    PT Goals (Current goals can be found in the Care Plan section)  Acute Rehab PT Goals Patient Stated Goal: to be able to mow the lawn PT Goal Formulation: With patient Time For Goal Achievement: 04/23/20 Potential to Achieve Goals: Good    Frequency Min 3X/week        Co-evaluation PT/OT/SLP Co-Evaluation/Treatment: Yes Reason for Co-Treatment: Complexity of the patient's impairments (multi-system involvement) PT goals addressed during session: Mobility/safety with mobility OT goals addressed during session: ADL's and self-care       AM-PAC PT "6 Clicks" Mobility  Outcome Measure Help needed turning from your back to your side while in a flat bed without using bedrails?: None Help needed moving from lying on your back to sitting on the side of a flat bed without using bedrails?: None Help needed moving to and from a bed to a chair (including a wheelchair)?: None Help needed  standing up from a chair using your arms (e.g., wheelchair or bedside chair)?: None Help needed to walk in hospital room?: None Help needed climbing 3-5 steps with a railing? : A Little 6 Click Score: 23    End of Session   Activity Tolerance: Patient tolerated treatment well Patient left: in chair;with call bell/phone within reach;with chair alarm set Nurse Communication: Mobility status PT Visit Diagnosis: Unsteadiness on feet (R26.81);Other abnormalities of gait and mobility (R26.89);Muscle weakness (generalized) (M62.81);Repeated falls (R29.6);History of falling (Z91.81);Difficulty in walking, not elsewhere classified (R26.2)    Time: 9798-9211 PT Time Calculation (min) (ACUTE ONLY): 25 min   Charges:   PT Evaluation $PT Eval Moderate Complexity: 1 Mod          Julien Berryman B. Migdalia Dk PT, DPT Acute Rehabilitation Services Pager [phone] Office [phone]   [place] 04/09/2020, 4:50 PM

## 2020-04-09 NOTE — Progress Notes (Addendum)
PROGRESS NOTE    Gary Odonnell   SWH:[id number]  DOB: 02-Feb-1932  DOA: 04/08/2020 PCP: Glenda Chroman, MD   Brief Narrative:  Gary Odonnell  a 86 y.o. male with medical history significant of  HTN, COPD, CAD/PVD s/p CABG 2003,  HFrEF EF 35%,  Afib, lung cancer s/p radiation, PAD, HLD, CVA.  He fell out of bed on 5/15 and laid on the floor because he could not get up on his own. He had a significant amount of vomiting as well.   In the ED > pulse ox in 60s, Troponin ~ 5 CXR- vascular congestion with interstitial thickening and a more subtle hazy airspace opacities, most evident on the right CT abdomen pelvis without contrast > small pleural effusion with mild dependent atelectasis, mild interstitial prominence and borderline cardiomegaly concerning for mild CHF, chronic diverticula without evidence of diverticulitis, significant vascular disease, asymmetry right renal cyst, T12 chronic compression fracture.  Subjective: He has a mild chronic cough. No chest pain. No dyspnea. No dysuria or pain on urination.   Assessment & Plan:   Principal Problem:   NSTEMI (non-ST elevated myocardial infarction), h/o CABG, h/o PAD and CVA - Troponin peaked ~ 8000 and now trending down- no chest pain but did have nausea and vomiting- stress test planned for tomorrow - on ASA, Eliquis, Coreg and Zetia  Active Problems:  Acute hypoxic respiratory failure in ED (A) Acute Chronic systolic and diastolic heart failure, Essential HTN - grade 3 d CHF and EF of 35-40% per ECHO - see below - given IV Lasix in ED on 5/15- now about 100% in room air - cardiology holding Lasix, Hydralazine & Entresto- cont Coreg    (B) Acute exacerbation of COPD/ cigar smoker - not on home O2  - he returned the oxygen many months ago -  does not have a pulmonologist - continues to smoke about 6 cigarettes per day - cough up mucous daily- no change in this - add Mucinex, Breo-ellipta, Duonebs- given Solumedrol in ED- hold  off on more for now    A-fib, persistent  - cont Apixaban and Coreg  LLL malignant lung nodule- 2016 - s/p SBRT    Diet-controlled diabetes mellitus  - A1c 6.6 in 1/21 -cont SSI  OSA - non compliant with CPAP  CKD 3b - Cr 1.69    Procedure: 1. Left ventricular ejection fraction, by estimation, is 35 to 40%. The  left ventricle has moderately decreased function. The left ventricle  demonstrates global hypokinesis with regional wall motion abnormalities  (see scoring diagram/findings for  description). There is mild left ventricular hypertrophy. Left ventricular  diastolic parameters are consistent with Grade III diastolic dysfunction  (restrictive). There is the interventricular septum is flattened in  systole and diastole, consistent with  right ventricular pressure and volume overload.  2. Right ventricular systolic function is normal. The right ventricular  size is normal. There is moderately elevated pulmonary artery systolic  pressure. The estimated right ventricular systolic pressure is 66.5 mmHg.  3. Left atrial size was moderately dilated.  4. Right atrial size was mild to moderately dilated.  5. The mitral valve is abnormal. Mild mitral valve regurgitation.  6. The aortic valve is tricuspid. Aortic valve regurgitation is not  visualized. Mild to moderate aortic valve sclerosis/calcification is  present, without any evidence of aortic stenosis.  7. The inferior vena cava is dilated in size with <50% respiratory  variability, suggesting right atrial pressure of 15 mmHg.  Time spent in minutes: 35 DVT prophylaxis: Eliquis Code Status: Full code Family Communication:  Disposition Plan:  Status is: Inpatient  Remains inpatient appropriate because: NSTEMI on Eliquis - need stress test   Dispo: The patient is from: Home              Anticipated d/c is to: Home              Anticipated d/c date is: 2 days              Patient currently is not medically  stable to d/c.       Consultants:   cardiology Procedures:   none Antimicrobials:  Anti-infectives (From admission, onward)   None       Objective: Vitals:   04/09/20 0317 04/09/20 0804 04/09/20 1132 04/09/20 1431  BP: 107/68 136/68 (!) 142/63 129/67  Pulse: 70 61 70 68  Resp: 18 14 18 19   Temp: 98 F (36.7 C) 98.1 F (36.7 C) 97.9 F (36.6 C) (!) 97.5 F (36.4 C)  TempSrc: Oral Oral Oral Oral  SpO2: 100% 100% 98% 96%  Weight: 90.7 kg       Intake/Output Summary (Last 24 hours) at 04/09/2020 1515 Last data filed at 04/09/2020 1454 Gross per 24 hour  Intake 840 ml  Output 300 ml  Net 540 ml   Filed Weights   04/08/20 2006 04/09/20 0317  Weight: 90.7 kg 90.7 kg    Examination: General exam: Appears comfortable  HEENT: PERRLA, oral mucosa moist, no sclera icterus or thrush Respiratory system: rhonchi throughout lungs. Respiratory effort normal. Cardiovascular system: S1 & S2 heard, RRR.   Gastrointestinal system: Abdomen soft, non-tender, nondistended. Normal bowel sounds. Central nervous system: Alert and oriented. No focal neurological deficits. Extremities: No cyanosis, clubbing or edema Skin: No rashes or ulcers Psychiatry:  Mood & affect appropriate.     Data Reviewed: I have personally reviewed following labs and imaging studies  CBC: Recent Labs  Lab 04/09/20 0404  WBC 16.6*  NEUTROABS 15.2*  HGB 11.4*  HCT 36.5*  MCV 99.7  PLT 086*   Basic Metabolic Panel: Recent Labs  Lab 04/09/20 0404  NA 142  K 4.8  CL 106  CO2 28  GLUCOSE 189*  BUN 36*  CREATININE 1.69*  CALCIUM 9.3  MG 2.0  PHOS 3.3   GFR: Estimated Creatinine Clearance: 36.8 mL/min (A) (by C-G formula based on SCr of 1.69 mg/dL (H)). Liver Function Tests: Recent Labs  Lab 04/09/20 0404  AST 46*  ALT 22  ALKPHOS 44  BILITOT 1.1  PROT 6.3*  ALBUMIN 3.3*   No results for input(s): LIPASE, AMYLASE in the last 168 hours. No results for input(s): AMMONIA in the  last 168 hours. Coagulation Profile: No results for input(s): INR, PROTIME in the last 168 hours. Cardiac Enzymes: Recent Labs  Lab 04/08/20 2315  CKTOTAL 351   BNP (last 3 results) No results for input(s): PROBNP in the last 8760 hours. HbA1C: Recent Labs    04/08/20 2315  HGBA1C 7.0*   CBG: Recent Labs  Lab 04/08/20 2255 04/09/20 0803 04/09/20 1131  GLUCAP 222* 162* 156*   Lipid Profile: No results for input(s): CHOL, HDL, LDLCALC, TRIG, CHOLHDL, LDLDIRECT in the last 72 hours. Thyroid Function Tests: Recent Labs    04/09/20 0404  TSH 5.414*   Anemia Panel: No results for input(s): VITAMINB12, FOLATE, FERRITIN, TIBC, IRON, RETICCTPCT in the last 72 hours. Urine analysis:    Component Value Date/Time  COLORURINE AMBER (A) 04/08/2020 2223   APPEARANCEUR CLOUDY (A) 04/08/2020 2223   LABSPEC 1.018 04/08/2020 2223   PHURINE 5.0 04/08/2020 2223   GLUCOSEU 50 (A) 04/08/2020 2223   HGBUR LARGE (A) 04/08/2020 2223   BILIRUBINUR NEGATIVE 04/08/2020 2223   KETONESUR NEGATIVE 04/08/2020 2223   PROTEINUR 100 (A) 04/08/2020 2223   NITRITE NEGATIVE 04/08/2020 2223   LEUKOCYTESUR MODERATE (A) 04/08/2020 2223   Sepsis Labs: @LABRCNTIP (procalcitonin:4,lacticidven:4) ) Recent Results (from the past 240 hour(s))  MRSA PCR Screening     Status: None   Collection Time: 04/09/20  7:32 AM   Specimen: Nasal Mucosa; Nasopharyngeal  Result Value Ref Range Status   MRSA by PCR NEGATIVE NEGATIVE Final    Comment:        The GeneXpert MRSA Assay (FDA approved for NASAL specimens only), is one component of a comprehensive MRSA colonization surveillance program. It is not intended to diagnose MRSA infection nor to guide or monitor treatment for MRSA infections. Performed at [facility], [place] [address] [place], [place] [address]          Radiology Studies: ECHOCARDIOGRAM COMPLETE  Result Date: 04/09/2020    ECHOCARDIOGRAM REPORT   Patient Name:   Gary Odonnell Date of Exam: 04/09/2020 Medical Rec #:  [id number]      Height:       71.0 in Accession #:    [id number]     Weight:       200.0 lb Date of Birth:  1932/02/25     BSA:          2.108 m Patient Age:    8 years       BP:           142/63 mmHg Patient Gender: M              HR:           69 bpm. Exam Location:  Inpatient Procedure: 2D Echo, Cardiac Doppler and Color Doppler Indications:    Elevated Troponin  History:        Patient has prior history of Echocardiogram examinations, most                 recent 01/11/2019. CHF, Previous Myocardial Infarction, Prior                 CABG, COPD and Stroke, Arrythmias:Atrial Fibrillation; Risk                 Factors:Hypertension, Diabetes and Dyslipidemia. Primary cancer                 of left lower lobe of lung,Tobacco abuse.  Sonographer:    Alvino Chapel RCS Referring Phys: [id number]  1. Left ventricular ejection fraction, by estimation, is 35 to 40%. The left ventricle has moderately decreased function. The left ventricle demonstrates global hypokinesis with regional wall motion abnormalities (see scoring diagram/findings for description). There is mild left ventricular hypertrophy. Left ventricular diastolic parameters are consistent with Grade III diastolic dysfunction (restrictive). There is the interventricular septum is flattened in systole and diastole, consistent with right ventricular pressure and volume overload.  2. Right ventricular systolic function is normal. The right ventricular size is normal. There is moderately elevated pulmonary artery systolic pressure. The estimated right ventricular systolic pressure is 23.5 mmHg.  3. Left atrial size was moderately dilated.  4. Right atrial size was mild to moderately dilated.  5. The mitral valve is abnormal.  Mild mitral valve regurgitation.  6. The aortic valve is tricuspid. Aortic valve regurgitation is not visualized. Mild to moderate aortic valve sclerosis/calcification is  present, without any evidence of aortic stenosis.  7. The inferior vena cava is dilated in size with <50% respiratory variability, suggesting right atrial pressure of 15 mmHg. FINDINGS  Left Ventricle: Left ventricular ejection fraction, by estimation, is 35 to 40%. The left ventricle has moderately decreased function. The left ventricle demonstrates regional wall motion abnormalities. The left ventricular internal cavity size was normal in size. There is mild left ventricular hypertrophy. The interventricular septum is flattened in systole and diastole, consistent with right ventricular pressure and volume overload. Left ventricular diastolic parameters are consistent with Grade III diastolic dysfunction (restrictive).  LV Wall Scoring: The basal inferior segment is akinetic. Right Ventricle: The right ventricular size is normal. No increase in right ventricular wall thickness. Right ventricular systolic function is normal. There is moderately elevated pulmonary artery systolic pressure. The tricuspid regurgitant velocity is 3.20 m/s, and with an assumed right atrial pressure of 15 mmHg, the estimated right ventricular systolic pressure is 21.3 mmHg. Left Atrium: Left atrial size was moderately dilated. Right Atrium: Right atrial size was mild to moderately dilated. Pericardium: There is no evidence of pericardial effusion. Mitral Valve: The mitral valve is abnormal. There is mild thickening of the mitral valve leaflet(s). There is mild calcification of the mitral valve leaflet(s). Moderate mitral annular calcification. Mild mitral valve regurgitation. Tricuspid Valve: The tricuspid valve is grossly normal. Tricuspid valve regurgitation is mild. Aortic Valve: The aortic valve is tricuspid. Aortic valve regurgitation is not visualized. Mild to moderate aortic valve sclerosis/calcification is present, without any evidence of aortic stenosis. Moderate aortic valve annular calcification. Aortic valve mean gradient  measures 8.0 mmHg. Aortic valve peak gradient measures 14.5 mmHg. Aortic valve area, by VTI measures 1.03 cm. Pulmonic Valve: The pulmonic valve was grossly normal. Pulmonic valve regurgitation is trivial. Aorta: The aortic root is normal in size and structure. Venous: The inferior vena cava is dilated in size with less than 50% respiratory variability, suggesting right atrial pressure of 15 mmHg. IAS/Shunts: No atrial level shunt detected by color flow Doppler.  LEFT VENTRICLE PLAX 2D LVIDd:         5.00 cm      Diastology LVIDs:         4.30 cm      LV e' medial:   3.49 cm/s LV PW:         1.30 cm      LV E/e' medial: 45.8 LV IVS:        1.00 cm LVOT diam:     1.90 cm LV SV:         46 LV SV Index:   22 LVOT Area:     2.84 cm  LV Volumes (MOD) LV vol d, MOD A2C: 102.0 ml LV vol d, MOD A4C: 99.9 ml LV vol s, MOD A2C: 50.5 ml LV vol s, MOD A4C: 62.6 ml LV SV MOD A2C:     51.5 ml LV SV MOD A4C:     99.9 ml LV SV MOD BP:      46.1 ml RIGHT VENTRICLE TAPSE (M-mode): 0.9 cm LEFT ATRIUM             Index       RIGHT ATRIUM           Index LA diam:        5.00 cm 2.37 cm/m  RA Area:     24.20 cm LA Vol (A2C):   88.9 ml 42.17 ml/m RA Volume:   84.30 ml  39.98 ml/m LA Vol (A4C):   92.8 ml 44.02 ml/m LA Biplane Vol: 98.2 ml 46.58 ml/m  AORTIC VALVE AV Area (Vmax):    1.23 cm AV Area (Vmean):   1.12 cm AV Area (VTI):     1.03 cm AV Vmax:           190.50 cm/s AV Vmean:          132.500 cm/s AV VTI:            0.450 m AV Peak Grad:      14.5 mmHg AV Mean Grad:      8.0 mmHg LVOT Vmax:         82.50 cm/s LVOT Vmean:        52.200 cm/s LVOT VTI:          0.163 m LVOT/AV VTI ratio: 0.36  AORTA Ao Root diam: 3.30 cm MITRAL VALVE                TRICUSPID VALVE MV Area (PHT): 4.39 cm     TR Peak grad:   41.0 mmHg MV Decel Time: 173 msec     TR Vmax:        320.00 cm/s MV E velocity: 160.00 cm/s                             SHUNTS                             Systemic VTI:  0.16 m                             Systemic Diam:  1.90 cm Rozann Lesches MD Electronically signed by Rozann Lesches MD Signature Date/Time: 04/09/2020/1:33:14 PM    Final       Scheduled Meds: . apixaban  5 mg Oral BID  . aspirin EC  81 mg Oral Daily  . carvedilol  6.25 mg Oral BID WC  . docusate sodium  100 mg Oral BID  . ezetimibe  10 mg Oral Daily  . insulin aspart  0-5 Units Subcutaneous QHS  . insulin aspart  0-9 Units Subcutaneous TID WC  . sodium chloride flush  3 mL Intravenous Q12H  . sodium chloride flush  3 mL Intravenous Q12H   Continuous Infusions: . sodium chloride       LOS: 1 day      Debbe Odea, MD [facility] Pager: [email or web address] 04/09/2020, 3:15 PM

## 2020-04-09 NOTE — Plan of Care (Signed)

## 2020-04-09 NOTE — Progress Notes (Signed)
CRITICAL VALUE ALERT  Critical Value:  Troponin 8,033  Date & Time Notied:  04/08/2020 2155  Provider Notified: Dr. Roel Cluck  Orders Received/Actions taken: Cardiology notified by Dr. Roel Cluck

## 2020-04-09 NOTE — Progress Notes (Signed)
*  PRELIMINARY RESULTS* Echocardiogram 2D Echocardiogram has been performed.  Gary Odonnell 04/09/2020, 1:17 PM

## 2020-04-09 NOTE — Consult Note (Addendum)
Cardiology Consultation:   Patient ID: Gary Odonnell MRN: [id number]; DOB: [birth year]  Admit date: 04/08/2020 Date of Consult: 04/09/2020  Primary Care Provider: Glenda Chroman, MD Primary Cardiologist: Carlyle Dolly, MD  Primary Electrophysiologist:  None    Patient Profile:   Gary Odonnell is a 86 y.o. male with a hx of CAD s/p CABG, ICM, chronic combined systolic and diastolic heart failure, PAD s/p intervention to L external iliac in 03/2015, HTN, HLD, persistent atrial fibrillation on Eliquis, hypothyroidism, tobacco abuse and COPD who is being seen today for the evaluation of elevated troponin at the request of Dr. Wynelle Cleveland.  History of Present Illness:   Gary Odonnell is a 86 year old male with past medical history of CAD s/p CABG, ICM, chronic combined systolic and diastolic heart failure, PAD s/p intervention to L external iliac in 03/2015, HTN, HLD, persistent atrial fibrillation on Eliquis, hypothyroidism, tobacco abuse and COPD. Most recent cardiac catheterization performed in 2014 demonstrated patent LIMA to LAD, occluded sequential radial graft to OM1-OM 2-PDA, occluded RCA with left-to-right collaterals. EF was 40 to 45% in 2015. By March 2019, ejection fraction has dropped down to 35 to 40%. Most recent echocardiogram performed on 01/11/2019 showed EF 30 to 35%, moderate LVH, mild aortic stenosis. Previous office notes suggest his dry weight is somewhere between 180-190 pounds. He is on alternating 40 and 60 mg Lasix every other day.   Two months ago, he fell out of his bed and hit his head. He had no issue afterward. He denies any recent sign of exertional chest pain or volume overload. He has no bleeding issues at home. He was essentially in his usual state of health until yesterday. He presented to [facility] ED yesterday with a fall episode. According to the patient, he became nauseated and was vomiting for roughly 20 minutes at home. He was reaching out to grab the nearest  trash can from his bed so he can vomit when he fell off the bed and landed on his bottom. He did not hit his head at this time. However he says he could not get off the floor afterward. He managed to call his son-in-law for live a mile and a half away. In total he was only on the floor for roughly 15 to 20 minutes. He denies any chest pain or worsening shortness of breath. Patient was subsequently transported to [facility] ED for further evaluation. Per report, he was hypoxic to the 60% in the field. CT of the head was negative for fracture or intracranial bleeding. CT abdomen pelvis without contrast showed small pleural effusion with mild dependent atelectasis, mild interstitial prominence and borderline cardiomegaly concerning for mild CHF, chronic diverticula without evidence of diverticulitis, significant vascular disease, asymmetry right renal cyst, T12 chronic compression fracture. Chest x-ray showed vascular congestion with interstitial thickening and a more subtle hazy airspace opacities, most evident on the right, suspect mild asymmetric pulmonary edema. The chest x-ray was reviewed by ED physician who felt patient likely has right lower lobe pneumonia. White blood cell count was 10.8. Cr 1.69. BUN 29. Glucose 216. Lactate 2.1. proBNP 10,886. Lipase normal. Total CK 221 (normal 46-171). Troponin was 5.669 -->7.589 (normal range <0.045). Patient was given a dose of Solu-Medrol and DuoNeb. Given the elevated troponin, patient was subsequently sent to [facility] for further evaluation. Talking with the patient, he says he is fairly independent although his wife manage his medication at home. Otherwise he has been able to cut grass and  mow his lawn without any exertional chest pain recently. He is a chronic smoker and has not noticed worsening dyspnea recently than usual. Since arrival [facility], BNP was 2748. Total CK 331. High-sensitivity troponin 8033 --> 7420-->7355--> 7107. Lactic  acid 2.1.      Past Medical History:  Diagnosis Date   Cancer ([place])    L lung   CHF (congestive heart failure) ([place])    a. EF 40-45% in 2015, at EF 30-35% by echo in 12/2018   Coronary artery disease    a. s/p CABG with most recent cath in 2014 showing patent LIMA-LAD, occluded Radial to OM1-OM2-PDA and occluded RCA with L--> R collaterals   Hypercholesteremia    Hypertension    PAD (peripheral artery disease) ([place])    Pneumonia 09/2013   Radiation april 2016   Solitary pulmonary nodule on lung CT 12/13/13   11/15/14   Stroke ([place]) 11/2008    Past Surgical History:  Procedure Laterality Date   Angioplasty to Left femoral artery     CARPAL TUNNEL RELEASE Left 05/11/2014   Procedure: LEFT CARPAL TUNNEL RELEASE;  Surgeon: Carole Civil, MD;  Location: [facility] ORS;  Service: Orthopedics;  Laterality: Left;   COLONOSCOPY N/A 01/24/2015   Procedure: COLONOSCOPY;  Surgeon: Danie Binder, MD;  Location: [facility] ENDO SUITE;  Service: Endoscopy;  Laterality: N/A;  130   CORONARY ARTERY BYPASS GRAFT     x4   FEMORAL-POPLITEAL BYPASS GRAFT Right 10/23/2015   Procedure: BYPASS GRAFT FEMORAL TO BELOW KNEE POPLITEAL ARTERY USING RIGHT NON-REVERSED GREATER SAPPHENOUS VEIN;  Surgeon: [name], MD;  Location: [place];  Service: Vascular;  Laterality: Right;   INTRAOPERATIVE ARTERIOGRAM Right 10/23/2015   Procedure: INTRA OPERATIVE ARTERIOGRAM - RIGHT LOWER LEG;  Surgeon: [name], MD;  Location: [place];  Service: Vascular;  Laterality: Right;   KNEE ARTHROSCOPY Right    LEFT AND RIGHT HEART CATHETERIZATION WITH CORONARY ANGIOGRAM N/A 09/29/2013   Procedure: LEFT AND RIGHT HEART CATHETERIZATION WITH CORONARY ANGIOGRAM;  Surgeon: Burnell Blanks, MD;  Location: [facility] CATH LAB;  Service: Cardiovascular;  Laterality: N/A;   PERIPHERAL VASCULAR CATHETERIZATION N/A 04/21/2015   Procedure: Abdominal Aortogram w/Lower Extremity;  Surgeon: [name], MD;  Location: [place] CV LAB;   Service: Cardiovascular;  Laterality: N/A;   Stent to right femoral artery     TRIGGER FINGER RELEASE Bilateral    VEIN HARVEST Right 10/23/2015   Procedure: [place];  Surgeon: [name], MD;  Location: [place];  Service: Vascular;  Laterality: Right;     Home Medications:  Prior to Admission medications   Medication Sig Start Date End Date Taking? Authorizing Provider  apixaban (ELIQUIS) 5 MG TABS tablet Take 1 tablet (5 mg total) by mouth 2 (two) times daily. 01/31/20   Arnoldo Lenis, MD  Carboxymethylcellulose Sodium (EYE DROPS OP) Apply 1 drop to eye daily as needed (dry eyes).     [provider]  carvedilol (COREG) 6.25 MG tablet Take 1 tablet (6.25 mg total) by mouth 2 (two) times daily with a meal. 02/04/20   Branch, Alphonse Guild, MD  ezetimibe (ZETIA) 10 MG tablet Take 1 tablet (10 mg total) by mouth daily. 03/07/20   Arnoldo Lenis, MD  furosemide (LASIX) 40 MG tablet Take one tab (40mg ) by mouth every other day alternating with 1 1/2 tabs (60mg ) every other day. 03/07/20   Arnoldo Lenis, MD  potassium chloride  SA (K-DUR,KLOR-CON) 20 MEQ tablet Take 1 tablet (20 mEq total) by mouth daily for 30 days. 02/12/19 07/27/20  Arnoldo Lenis, MD  sacubitril-valsartan (ENTRESTO) 49-51 MG Take 1 tablet by mouth 2 (two) times daily. 11/05/19   Arnoldo Lenis, MD    Inpatient Medications: Scheduled Meds:  apixaban  5 mg Oral BID   aspirin EC  81 mg Oral Daily   carvedilol  6.25 mg Oral BID WC   Chlorhexidine Gluconate Cloth  6 each Topical Daily   docusate sodium  100 mg Oral BID   ezetimibe  10 mg Oral Daily   insulin aspart  0-5 Units Subcutaneous QHS   insulin aspart  0-9 Units Subcutaneous TID WC   sodium chloride flush  3 mL Intravenous Q12H   sodium chloride flush  3 mL Intravenous Q12H   Continuous Infusions:  sodium chloride     PRN Meds: sodium chloride, acetaminophen **OR** acetaminophen, HYDROcodone-acetaminophen,  ondansetron **OR** ondansetron (ZOFRAN) IV, sodium chloride flush  Allergies:   No Known Allergies  Social History:   Social History   Socioeconomic History   Marital status: Married    Spouse name: Not on file   Number of children: Not on file   Years of education: Not on file   Highest education level: Not on file  Occupational History   Not on file  Tobacco Use   Smoking status: Current Every Day Smoker    Packs/day: 0.50    Years: 60.00    Pack years: 30.00    Types: Cigars, Cigarettes    Start date: 11/21/1949    Last attempt to quit: 01/05/2019    Years since quitting: 1.2   Smokeless tobacco: Never Used   Tobacco comment: 1-2 cigars per day   Substance and Sexual Activity   Alcohol use: No    Alcohol/week: 0.0 standard drinks   Drug use: No   Sexual activity: Not on file  Other Topics Concern   Not on file  Social History Narrative   Not on file   Social Determinants of Health   Financial Resource Strain:    Difficulty of Paying Living Expenses:   Food Insecurity:    Worried About Charity fundraiser in the Last Year:    Arboriculturist in the Last Year:   Transportation Needs:    Film/video editor (Medical):    Lack of Transportation (Non-Medical):   Physical Activity:    Days of Exercise per Week:    Minutes of Exercise per Session:   Stress:    Feeling of Stress :   Social Connections:    Frequency of Communication with Friends and Family:    Frequency of Social Gatherings with Friends and Family:    Attends Religious Services:    Active Member of Clubs or Organizations:    Attends Music therapist:    Marital Status:   Intimate Partner Violence:    Fear of Current or Ex-Partner:    Emotionally Abused:    Physically Abused:    Sexually Abused:     Family History:    Family History  Problem Relation Age of Onset   Heart attack Father    Hyperlipidemia Father    Diabetes Mother    Diabetes Brother    Colon cancer Neg Hx       ROS:  Please see the history of present illness.   All other ROS reviewed and negative.     Physical Exam/Data:  Vitals:   04/09/20 0018 04/09/20 0100 04/09/20 0317 04/09/20 0804  BP: 110/78  107/68 136/68  Pulse: 70  70 61  Resp: 16  18 14   Temp: 97.8 F (36.6 C)  98 F (36.7 C) 98.1 F (36.7 C)  TempSrc: Oral  Oral Oral  SpO2:  100% 100% 100%  Weight:   90.7 kg     Intake/Output Summary (Last 24 hours) at 04/09/2020 0856 Last data filed at 04/09/2020 0800 Gross per 24 hour  Intake 480 ml  Output --  Net 480 ml   Last 3 Weights 04/09/2020 04/08/2020 12/15/2019  Weight (lbs) 199 lb 15.3 oz 199 lb 15.3 oz 190 lb  Weight (kg) 90.7 kg 90.7 kg 86.183 kg     Body mass index is 27.89 kg/m.  General:  Well nourished, well developed, in no acute distress HEENT: normal Lymph: no adenopathy Neck: no JVD Endocrine:  No thryomegaly Vascular: No carotid bruits; FA pulses 2+ bilaterally without bruits  Cardiac:  normal S1, S2; RRR; no murmur  Lungs:  Diffuse rhonchi  Abd: soft, nontender, no hepatomegaly  Ext: no edema Musculoskeletal:  No deformities, BUE and BLE strength normal and equal Skin: warm and dry  Neuro:  CNs 2-12 intact, no focal abnormalities noted Psych:  Normal affect   EKG:  The EKG was personally reviewed and demonstrates:  Atrial fibrillation, LBBB, TWI in the inferior leads.  Telemetry:  Telemetry was personally reviewed and demonstrates:  Atrial fibrillation, HR 50-60s  Relevant CV Studies:  Echo 01/11/2019 1. The left ventricle has moderate-severely reduced systolic function,  with an ejection fraction of 30-35%. The cavity size was mildly dilated.  There is moderately increased left ventricular wall thickness. Left  ventricular diastolic Doppler parameters  are indeterminate.   2. The right ventricle has normal systolic function. The cavity was  normal. There is no increase in right ventricular wall thickness.   3. Left atrial size was  moderately dilated.   4. Trivial pericardial effusion is present.   5. The mitral valve is degenerative. Moderate thickening of the mitral  valve leaflet. Moderate calcification of the mitral valve leaflet. There  is moderate mitral annular calcification present.   6. The tricuspid valve is normal in structure.   7. The aortic valve is tricuspid Moderate thickening of the aortic valve  Moderate calcification of the aortic valve. mild stenosis of the aortic  valve.   8. The pulmonic valve was grossly normal. Pulmonic valve regurgitation is  mild by color flow Doppler.   9. Right atrial pressure is estimated at 8 mmHg.   Laboratory Data:  High Sensitivity Troponin:   Recent Labs  Lab 04/08/20 2047 04/08/20 2315 04/09/20 0404 04/09/20 0445  TROPONINIHS 8,033* 7,420* 7,355* 7,107*     Chemistry Recent Labs  Lab 04/09/20 0404  NA 142  K 4.8  CL 106  CO2 28  GLUCOSE 189*  BUN 36*  CREATININE 1.69*  CALCIUM 9.3  GFRNONAA 36*  GFRAA 42*  ANIONGAP 8    Recent Labs  Lab 04/09/20 0404  PROT 6.3*  ALBUMIN 3.3*  AST 46*  ALT 22  ALKPHOS 44  BILITOT 1.1   Hematology Recent Labs  Lab 04/09/20 0404  WBC 16.6*  RBC 3.66*  HGB 11.4*  HCT 36.5*  MCV 99.7  MCH 31.1  MCHC 31.2  RDW 13.7  PLT 122*   BNP Recent Labs  Lab 04/08/20 2315  BNP 2,748.2*    DDimer No results for input(s): DDIMER in  the last 168 hours.   Radiology/Studies:  No results found.     TIMI Risk Score for Unstable Angina or Non-ST Elevation MI:   The patient's TIMI risk score is 5, which indicates a 26% risk of all cause mortality, new or recurrent myocardial infarction or need for urgent revascularization in the next 14 days.   Assessment and Plan:   NSTEMI: Unclear if Type 1 vs Type 2 demand ischemia  - [facility] ED lab White blood cell count was 10.8. Cr 1.69. BUN 29. Glucose 216. Lactate 2.1. proBNP 10,886. Lipase normal. Total CK 221 (normal 46-171). Troponin was 5.669 -->7.589  (normal range <0.045).  -  BNP was 2748. Total CK 331. High-sensitivity troponin 8033 --> 7420-->7355--> 7107  - Denies any recent anginal symptom even with exertion, last cath was in 2014. H/o CABG. New TWI in the inferior leads noted on the EKG. Will discuss with MD regarding Myoview vs cath. Patient was eating breakfast when I interviewed him.  Possible COPD exacerbation: CXR obtained at [facility] showed either RLL PNA vs asymmetric CHF   - BNP 2748 here, however does not appears to be significantly volume overloaded. Weight is near his dry weight.   CAD s/p CABG: Last cath was in 2014  Chronic combined systolic and diastolic heart failure  - on alternating 40 and 60 mg lasix every other day at home  Longstanding persistent atrial fibrillation: Hold Eliquis, start IV heparin in case need intervention. Took Eliquis this morning.   PAD: last LE angiography in 2016  HTN: reportedly was mildly hypotensive on arrival at [facility], however BP is normal here  HLD: only on Zetia at home. Lipid uncontrolled. Especially given NSTEMI, will need to start on a statin.   Hypothyroidism: not on synthroid at home, TSH was 13 in Jan 2021, now it is 5.4.       For questions or updates, please contact [place] Please consult [email or web address] for contact info under     Hilbert Corrigan, [place]  04/09/2020 8:56 AM  Patient examined chart reviewed Discussed care with patient and PA. Exam with poor dentition. Rhonchi / wheezing lungs right > left , S1S2 SEM no AR murmur Abdomen soft  left femoral bruit no edema. ECG chronically with IVCD of LBBB type T inversions in 3,F Distant CABG He denies chest pain Rx for COPD exacerbation as his lungs are clearly abnormal on exam D/C smoking Given age and on eliquis would like to avoid cath With significant troponin elevation will order lexiscan myovue for am as patient has already eaten a full breakfast  Jenkins Rouge MD Leonidas Romberg

## 2020-04-09 NOTE — Evaluation (Signed)
Occupational Therapy Evaluation Patient Details Name: Gary Odonnell MRN: [id number] DOB: 05-04-1932 Today's Date: 04/09/2020    History of Present Illness 86 y.o. male with medical history significant of  HTN, COPD, CAD/PVD s/p CABG 2003,  HFrEF EF 35%,  Afib, lung cancer s/p radiation, PAD, HLD, CVA. [facility] whereCXR showed RLL infiltrate, A. Fib with T-wave inversions. BNP elevated and troponins increasing. Transfered to [facility] for treatment 04/08/20.   Clinical Impression   PTA pt living with spouse and independent for BADL/IADL. Pt endorses two recent falls and describes his balance as being "gone". At time of eval, pt completing bed mobility and sit <> stands at min guard assist with RW. Pt requires close steadying and cues to safely maintain dynamic standing balance. Pt simulated toilet transfer again with min guard assist. He current is presenting with poor activity tolerance and would benefit from continued acute OT. Recommend HHOT at d/c to continue BADL progression in home environment.    Follow Up Recommendations  Home health OT    Equipment Recommendations  None recommended by OT    Recommendations for Other Services       Precautions / Restrictions Precautions Precautions: Fall Precaution Comments: came in as a result of a fall Restrictions Weight Bearing Restrictions: No      Mobility Bed Mobility Overal bed mobility: Needs Assistance Bed Mobility: Supine to Sit     Supine to sit: Min guard     General bed mobility comments: min guard for safety  Transfers Overall transfer level: Needs assistance Equipment used: Rolling walker (2 wheeled) Transfers: Sit to/from Stand Sit to Stand: Min guard         General transfer comment: min guard for safety, able to static stand prior to reaching for RW    Balance Overall balance assessment: Needs assistance Sitting-balance support: Feet supported;No upper extremity supported Sitting  balance-Leahy Scale: Good     Standing balance support: No upper extremity supported;During functional activity Standing balance-Leahy Scale: Fair Standing balance comment: able to static stand needs UE support for dynamic balance                            ADL either performed or assessed with clinical judgement   ADL Overall ADL's : Needs assistance/impaired Eating/Feeding: Set up;Sitting   Grooming: Supervision/safety;Standing   Upper Body Bathing: Set up;Sitting   Lower Body Bathing: Set up;Sitting/lateral leans;Sit to/from stand   Upper Body Dressing : Set up;Sitting   Lower Body Dressing: Min guard;Sitting/lateral leans;Sit to/from stand Lower Body Dressing Details (indicate cue type and reason): increased time and effort to don socks Toilet Transfer: Min Marine scientist Details (indicate cue type and reason): steadying support needed for dynamic balance Toileting- Clothing Manipulation and Hygiene: Set up;Sitting/lateral lean;Sit to/from stand   Tub/ Shower Transfer: Min guard;Ambulation;Rolling walker;Shower seat   Functional mobility during ADLs: Min guard;Rolling walker       Vision Patient Visual Report: No change from baseline       Perception     Praxis      Pertinent Vitals/Pain Pain Assessment: Faces Faces Pain Scale: Hurts little more Pain Location: low back with ambulation  Pain Descriptors / Indicators: Grimacing Pain Intervention(s): Limited activity within patient's tolerance;Monitored during session;Repositioned     Hand Dominance     Extremity/Trunk Assessment Upper Extremity Assessment Upper Extremity Assessment: Generalized weakness   Lower Extremity Assessment Lower Extremity Assessment: Generalized weakness  Cervical / Trunk Assessment Cervical / Trunk Assessment: Kyphotic   Communication Communication Communication: No difficulties   Cognition Arousal/Alertness: Awake/alert Behavior During  Therapy: WFL for tasks assessed/performed Overall Cognitive Status: Within Functional Limits for tasks assessed                                     General Comments  VSS on RA, SaO2 dropped to mid 80s with poor pleth wave form, pulse ox sensor adjusted when seated and read 100%O2 with stong wave form, assisted pt with ordering dinner    Exercises     Shoulder Instructions      Home Living Family/patient expects to be discharged to:: Private residence Living Arrangements: Spouse/significant other Available Help at Discharge: Family;Available 24 hours/day Type of Home: House Home Access: Ramped entrance     Home Layout: One level     Bathroom Shower/Tub: Occupational psychologist: Standard Bathroom Accessibility: Yes How Accessible: Accessible via walker Home Equipment: [place] - 2 wheels;Wheelchair - manual;Cane - single point;Grab bars - tub/shower          Prior Functioning/Environment Level of Independence: Independent with assistive device(s)        Comments: uses cane for short distance walking, uses RW for long distance in store, still mows grass, plumbing         OT Problem List: Decreased strength;Decreased knowledge of use of DME or AE;Decreased activity tolerance;Cardiopulmonary status limiting activity;Impaired balance (sitting and/or standing)      OT Treatment/Interventions: Therapeutic exercise;Patient/family education;Self-care/ADL training;Balance training;Energy conservation;Therapeutic activities;DME and/or AE instruction    OT Goals(Current goals can be found in the care plan section) Acute Rehab OT Goals Patient Stated Goal: to be able to mow the lawn OT Goal Formulation: With patient Time For Goal Achievement: 04/23/20 Potential to Achieve Goals: Good  OT Frequency: Min 2X/week   Barriers to D/C:            Co-evaluation   Reason for Co-Treatment: For patient/therapist safety;To address functional/ADL transfers PT  goals addressed during session: Mobility/safety with mobility OT goals addressed during session: ADL's and self-care      AM-PAC OT "6 Clicks" Daily Activity     Outcome Measure Help from another person eating meals?: None Help from another person taking care of personal grooming?: A Little Help from another person toileting, which includes using toliet, bedpan, or urinal?: A Little Help from another person bathing (including washing, rinsing, drying)?: A Little Help from another person to put on and taking off regular upper body clothing?: None Help from another person to put on and taking off regular lower body clothing?: A Little 6 Click Score: 20   End of Session Equipment Utilized During Treatment: Gait belt;Rolling walker Nurse Communication: Mobility status  Activity Tolerance: Patient tolerated treatment well Patient left: in chair;with call bell/phone within reach;with chair alarm set  OT Visit Diagnosis: Unsteadiness on feet (R26.81);Other abnormalities of gait and mobility (R26.89);Muscle weakness (generalized) (M62.81)                Time: 8403-7543 OT Time Calculation (min): 25 min Charges:  OT General Charges $OT Visit: 1 Visit OT Evaluation $OT Eval Moderate Complexity: [place], MSOT, OTR/L Acute Rehabilitation Services [facility] Office Number: [phone] Pager: [phone]  Zenovia Jarred 04/09/2020, 5:17 PM

## 2020-04-10 ENCOUNTER — Inpatient Hospital Stay (HOSPITAL_COMMUNITY): Payer: Medicare Other

## 2020-04-10 ENCOUNTER — Other Ambulatory Visit: Payer: Self-pay

## 2020-04-10 DIAGNOSIS — L8990 Pressure ulcer of unspecified site, unspecified stage: Secondary | ICD-10-CM | POA: Insufficient documentation

## 2020-04-10 LAB — CBC
HCT: 36.9 % — ABNORMAL LOW (ref 39.0–52.0)
Hemoglobin: 11.7 g/dL — ABNORMAL LOW (ref 13.0–17.0)
MCH: 31.3 pg (ref 26.0–34.0)
MCHC: 31.7 g/dL (ref 30.0–36.0)
MCV: 98.7 fL (ref 80.0–100.0)
Platelets: 111 10*3/uL — ABNORMAL LOW (ref 150–400)
RBC: 3.74 MIL/uL — ABNORMAL LOW (ref 4.22–5.81)
RDW: 13.6 % (ref 11.5–15.5)
WBC: 10.8 10*3/uL — ABNORMAL HIGH (ref 4.0–10.5)
nRBC: 0 % (ref 0.0–0.2)

## 2020-04-10 LAB — BASIC METABOLIC PANEL
Anion gap: 7 (ref 5–15)
BUN: 46 mg/dL — ABNORMAL HIGH (ref 8–23)
CO2: 26 mmol/L (ref 22–32)
Calcium: 9.4 mg/dL (ref 8.9–10.3)
Chloride: 107 mmol/L (ref 98–111)
Creatinine, Ser: 1.64 mg/dL — ABNORMAL HIGH (ref 0.61–1.24)
GFR calc Af Amer: 44 mL/min — ABNORMAL LOW (ref >60)
GFR calc non Af Amer: 38 mL/min — ABNORMAL LOW (ref >60)
Glucose, Bld: 137 mg/dL — ABNORMAL HIGH (ref 70–99)
Potassium: 4.4 mmol/L (ref 3.5–5.1)
Sodium: 140 mmol/L (ref 135–145)

## 2020-04-10 LAB — GLUCOSE, CAPILLARY
Glucose-Capillary: 160 mg/dL — ABNORMAL HIGH (ref 70–99)
Glucose-Capillary: 150 mg/dL — ABNORMAL HIGH (ref 70–99)
Glucose-Capillary: 127 mg/dL — ABNORMAL HIGH (ref 70–99)
Glucose-Capillary: 120 mg/dL — ABNORMAL HIGH (ref 70–99)

## 2020-04-10 MED ORDER — SODIUM CHLORIDE 0.9 % IV SOLN: INTRAVENOUS | Status: DC

## 2020-04-10 MED ORDER — FUROSEMIDE 40 MG PO TABS: 40.0000 mg | ORAL_TABLET | ORAL | Status: DC

## 2020-04-10 MED ORDER — IPRATROPIUM-ALBUTEROL 0.5-2.5 (3) MG/3ML IN SOLN
3.0000 mL | Freq: Three times a day (TID) | RESPIRATORY_TRACT | Status: DC
  Administered 2020-04-10 – 2020-04-11 (×2): 3 mL via RESPIRATORY_TRACT
  Filled 2020-04-10 (×2): qty 3

## 2020-04-10 MED ORDER — SODIUM CHLORIDE 0.9% FLUSH: 3.0000 mL | INTRAVENOUS | Status: DC | PRN

## 2020-04-10 MED ORDER — HEPARIN (PORCINE) 25000 UT/250ML-% IV SOLN
1100.0000 [IU]/h | INTRAVENOUS | Status: DC
  Administered 2020-04-10: 1100 [IU]/h via INTRAVENOUS
  Filled 2020-04-10: qty 250

## 2020-04-10 MED ORDER — FUROSEMIDE 40 MG PO TABS: 60.0000 mg | ORAL_TABLET | ORAL | Status: DC

## 2020-04-10 MED ORDER — ASPIRIN 81 MG PO CHEW
81.0000 mg | CHEWABLE_TABLET | ORAL | Status: AC
  Administered 2020-04-11: 81 mg via ORAL
  Filled 2020-04-10: qty 1

## 2020-04-10 MED ORDER — ASPIRIN EC 81 MG PO TBEC: 81.0000 mg | DELAYED_RELEASE_TABLET | Freq: Every day | ORAL | Status: DC

## 2020-04-10 MED ORDER — SODIUM CHLORIDE 0.9 % IV SOLN: 250.0000 mL | INTRAVENOUS | Status: DC | PRN

## 2020-04-10 MED ORDER — SODIUM CHLORIDE 0.9% FLUSH
3.0000 mL | Freq: Two times a day (BID) | INTRAVENOUS | Status: DC
  Administered 2020-04-11: 3 mL via INTRAVENOUS

## 2020-04-10 NOTE — Progress Notes (Addendum)
Nutrition Brief Note  Received MD consult for assessment of nutrition status.  Wt Readings from Last 15 Encounters:  04/10/20 90.5 kg  12/15/19 86.2 kg  09/08/19 85.3 kg  07/28/19 83.5 kg  05/26/19 85.3 kg  03/19/19 83.5 kg  02/04/19 80.1 kg  01/18/19 93.9 kg  01/08/19 96.5 kg  12/29/18 89.4 kg  11/16/18 88.9 kg  09/22/18 87.5 kg  09/08/18 87.5 kg  09/01/18 87.5 kg  08/28/18 86.2 kg    Body mass index is 27.83 kg/m. Patient meets criteria for overweight based on current BMI.   No significant weight changes noted.   Current diet order is heart healthy, patient is consuming approximately 90-100% of meals at this time. Labs and medications reviewed.   Patient reports no nutrition issues; he has a good appetite and has been eating well.   No nutrition interventions warranted at this time. If nutrition issues arise, please consult RD.   Lucas Mallow, RD, LDN, CNSC Please refer to [facility] for contact information.

## 2020-04-10 NOTE — Progress Notes (Signed)
ANTICOAGULATION CONSULT NOTE - Initial Consult  Pharmacy Consult for heparin  Indication: chest pain/ACS  No Known Allergies  Patient Measurements: Weight: 90.5 kg (199 lb 8.3 oz) Heparin Dosing Weight: 90kg  Vital Signs: Temp: 97.5 F (36.4 C) (05/17 0727) Temp Source: Oral (05/17 0727) BP: 125/60 (05/17 0727) Pulse Rate: 94 (05/17 0727)  Labs: Recent Labs    04/08/20 2315 04/09/20 0404 04/09/20 0445 04/10/20 0338  HGB  --  11.4*  --  11.7*  HCT  --  36.5*  --  36.9*  PLT  --  122*  --  111*  CREATININE  --  1.69*  --  1.64*  CKTOTAL 351  --   --   --   TROPONINIHS 7,420* 7,355* 7,107*  --     Estimated Creatinine Clearance: 37.9 mL/min (A) (by C-G formula based on SCr of 1.64 mg/dL (H)).   Medical History: Past Medical History:  Diagnosis Date  . Cancer ([place])    L lung  . CHF (congestive heart failure) ([place])    a. EF 40-45% in 2015, at EF 30-35% by echo in 12/2018  . Coronary artery disease    a. s/p CABG with most recent cath in 2014 showing patent LIMA-LAD, occluded Radial to OM1-OM2-PDA and occluded RCA with L--> R collaterals  . Hypercholesteremia   . Hypertension   . PAD (peripheral artery disease) ([place])   . Pneumonia 09/2013  . Radiation april 2016  . Solitary pulmonary nodule on lung CT 12/13/13   11/15/14  . Stroke [facility]) 11/2008    Medications:  Medications Prior to Admission  Medication Sig Dispense Refill Last Dose  . apixaban (ELIQUIS) 5 MG TABS tablet Take 1 tablet (5 mg total) by mouth 2 (two) times daily. 180 tablet 3 04/08/2020 at 1800  . Carboxymethylcellulose Sodium (EYE DROPS OP) Apply 1 drop to eye daily as needed (dry eyes).    unknown at unknown  . carvedilol (COREG) 6.25 MG tablet Take 1 tablet (6.25 mg total) by mouth 2 (two) times daily with a meal. 180 tablet 2 04/08/2020 at 1800  . ezetimibe (ZETIA) 10 MG tablet Take 1 tablet (10 mg total) by mouth daily. 90 tablet 3 04/08/2020 at Unknown time  . furosemide (LASIX) 40 MG tablet Take  one tab (40mg ) by mouth every other day alternating with 1 1/2 tabs (60mg ) every other day. (Patient taking differently: Take 40 mg by mouth See admin instructions. Take one tab (40mg ) by mouth every other day alternating with 1 1/2 tabs (60mg ) every other day.) 120 tablet 3 04/08/2020 at Unknown time  . hydrALAZINE (APRESOLINE) 25 MG tablet Take 37.5 mg by mouth every 8 (eight) hours.   04/08/2020 at Unknown time  . Potassium 99 MG TABS Take 1 tablet by mouth daily.   04/08/2020 at Unknown time  . sacubitril-valsartan (ENTRESTO) 49-51 MG Take 1 tablet by mouth 2 (two) times daily. 180 tablet 1 04/08/2020 at Unknown time  . potassium chloride SA (K-DUR,KLOR-CON) 20 MEQ tablet Take 1 tablet (20 mEq total) by mouth daily for 30 days. (Patient not taking: Reported on 04/09/2020) 90 tablet 1 Not Taking at Unknown time   Scheduled:  . aspirin EC  81 mg Oral Daily  . carvedilol  6.25 mg Oral BID WC  . docusate sodium  100 mg Oral BID  . ezetimibe  10 mg Oral Daily  . fluticasone furoate-vilanterol  1 puff Inhalation Daily  . guaiFENesin  600 mg Oral BID  . insulin aspart  0-5 Units Subcutaneous QHS  . insulin aspart  0-9 Units Subcutaneous TID WC  . ipratropium-albuterol  3 mL Nebulization QID  . sodium chloride flush  3 mL Intravenous Q12H  . sodium chloride flush  3 mL Intravenous Q12H    Assessment: 86 yo male with NSTEMI and plans for cath. He is on apixaban PTA (last dose 5/17 at 8am). Pharmacy consulted to dose heparin -Hg= 11.7, plt= 111 (history of thrombocytopenia)  Goal of Therapy:  Heparin level 0.3-0.7 units/ml aPTT 66-102 seconds Monitor platelets by anticoagulation protocol: Yes   Plan:  -No heparin bolus -Start heparin at 8pm at 1100 units/hr -aPTT and heparin level in 8 hours and daily with CBC daily  Hildred Laser, PharmD Clinical Pharmacist **Pharmacist phone directory can now be found on [email or web address] ([email or web address]).  Listed under [place].

## 2020-04-10 NOTE — Plan of Care (Signed)
  Problem: Clinical Measurements: Goal: Diagnostic test results will improve Outcome: Progressing   Problem: Elimination: Goal: Will not experience complications related to urinary retention Outcome: Completed/Met Pt voids in urinal without difficulty

## 2020-04-10 NOTE — Progress Notes (Addendum)
PROGRESS NOTE    Gary Odonnell   YYT:[id number]  DOB: [birth year]  DOA: 04/08/2020 PCP: Glenda Chroman, MD   Brief Narrative:  [name]  a 86 y.o. male with medical history significant of  HTN, COPD, CAD/PVD s/p CABG 2003,  HFrEF EF 35%,  Afib, lung cancer s/p radiation, PAD, HLD, CVA.  He fell out of bed on 5/15 and laid on the floor because he could not get up on his own. He had a significant amount of vomiting as well.   In the ED > pulse ox in 60s, Troponin ~ 5 CXR- vascular congestion with interstitial thickening and a more subtle hazy airspace opacities, most evident on the right CT abdomen pelvis without contrast > small pleural effusion with mild dependent atelectasis, mild interstitial prominence and borderline cardiomegaly concerning for mild CHF, chronic diverticula without evidence of diverticulitis, significant vascular disease, asymmetry right renal cyst, T12 chronic compression fracture.  Subjective: No nausea or vomiting. He has his usual cough which is unchanged.    Assessment & Plan:   Principal Problem:   NSTEMI (non-ST elevated myocardial infarction), h/o CABG, h/o PAD and CVA - Troponin peaked ~ 8000 and now trending down- no chest pain but did have nausea and vomiting -Cardiology has decided to do a cardiac cath-the patient received his Eliquis this morning and therefore it will need to be held today-heparin will be used once Eliquis has washed out - on ASA, Eliquis, Coreg and Zetia  Active Problems:  Acute hypoxic respiratory failure in ED (A) Acute Chronic systolic and diastolic heart failure with essential HTN - grade 3 d CHF and EF of 35-40% per ECHO - see below - given IV Lasix in ED on 5/15 - about 100% in room air on 5/16 - cardiology holding Lasix, Hydralazine & Entresto- cont Coreg   - I will resume Lasix today to prevent fluid overload again   (B) Acute exacerbation of COPD with elevated RV pressure on ECHO > Cor pulmonale in a cigar  smoker - not on home O2  - he returned the oxygen many months ago -  does not have a pulmonologist - continues to smoke  -I have counseled him to stop -He has a chronic cough with sputum production and may have chronic bronchitis-although he had a right lower lobe infiltrate in the ED, I do not feel he has pneumonia -I have noted rhonchi on exam daily and he admits to coughing up mucus daily and have added Mucinex and Breo ellipta which should be continued for home use  -continue Duonebs which I have added for the hospital stay- given Solumedrol in ED (which is what prompted his WBC to go up from 10-16)- hold off on more for now-no wheezing    A-fib, persistent  -On apixaban and Coreg  LLL malignant lung nodule- 2016 - s/p SBRT    Diet-controlled diabetes mellitus  - A1c 6.6 in 1/21 and 7.0 on 5/16 -cont SSI  OSA - non compliant with CPAP  CKD 3b - Cr 1.69  Slightly elevated TSH  -High possibility of having sick euthyroid syndrome  -TSH should be rechecked by his PCP as outpatient  Pyuria - Is asymptomatic and therefore we will hold off on treating this    Procedure: 1. Left ventricular ejection fraction, by estimation, is 35 to 40%. The  left ventricle has moderately decreased function. The left ventricle  demonstrates global hypokinesis with regional wall motion abnormalities  (see scoring diagram/findings for  description). There is mild left ventricular hypertrophy. Left ventricular  diastolic parameters are consistent with Grade III diastolic dysfunction  (restrictive). There is the interventricular septum is flattened in  systole and diastole, consistent with  right ventricular pressure and volume overload.  2. Right ventricular systolic function is normal. The right ventricular  size is normal. There is moderately elevated pulmonary artery systolic  pressure. The estimated right ventricular systolic pressure is 42.5 mmHg.  3. Left atrial size was moderately  dilated.  4. Right atrial size was mild to moderately dilated.  5. The mitral valve is abnormal. Mild mitral valve regurgitation.  6. The aortic valve is tricuspid. Aortic valve regurgitation is not  visualized. Mild to moderate aortic valve sclerosis/calcification is  present, without any evidence of aortic stenosis.  7. The inferior vena cava is dilated in size with <50% respiratory  variability, suggesting right atrial pressure of 15 mmHg.    Time spent in minutes: 35 DVT prophylaxis: Eliquis / Heparin Code Status: Full code Family Communication:  Disposition Plan:  Status is: Inpatient  Remains inpatient appropriate because: NSTEMI on Eliquis - needs a cath tomorrow   Dispo: The patient is from: Home              Anticipated d/c is to: Home              Anticipated d/c date is: 2 days              Patient currently is not medically stable to d/c.   Consultants:   cardiology Procedures:   none Antimicrobials:  Anti-infectives (From admission, onward)   None       Objective: Vitals:   04/09/20 1941 04/10/20 0006 04/10/20 0443 04/10/20 0727  BP: 128/60 128/66 129/77 125/60  Pulse: 72 68 70 94  Resp:    17  Temp: 98 F (36.7 C) 98 F (36.7 C) 98 F (36.7 C) (!) 97.5 F (36.4 C)  TempSrc: Oral Oral Oral Oral  SpO2:    93%  Weight:   90.5 kg     Intake/Output Summary (Last 24 hours) at 04/10/2020 1020 Last data filed at 04/10/2020 0700 Gross per 24 hour  Intake 600 ml  Output 750 ml  Net -150 ml   Filed Weights   04/08/20 2006 04/09/20 0317 04/10/20 0443  Weight: 90.7 kg 90.7 kg 90.5 kg    Examination: General exam: Appears comfortable  HEENT: PERRLA, oral mucosa moist, no sclera icterus or thrush Respiratory system: diffuse rhonchi - Respiratory effort normal. Cardiovascular system: S1 & S2 heard,  No murmurs  Gastrointestinal system: Abdomen soft, non-tender, nondistended. Normal bowel sounds   Central nervous system: Alert and oriented. No  focal neurological deficits. Extremities: No cyanosis, clubbing or edema Skin: No rashes or ulcers Psychiatry:  Mood & affect appropriate.     Data Reviewed: I have personally reviewed following labs and imaging studies  CBC: Recent Labs  Lab 04/09/20 0404 04/10/20 0338  WBC 16.6* 10.8*  NEUTROABS 15.2*  --   HGB 11.4* 11.7*  HCT 36.5* 36.9*  MCV 99.7 98.7  PLT 122* 956*   Basic Metabolic Panel: Recent Labs  Lab 04/09/20 0404 04/10/20 0338  NA 142 140  K 4.8 4.4  CL 106 107  CO2 28 26  GLUCOSE 189* 137*  BUN 36* 46*  CREATININE 1.69* 1.64*  CALCIUM 9.3 9.4  MG 2.0  --   PHOS 3.3  --    GFR: Estimated Creatinine Clearance: 37.9 mL/min (A) (  by C-G formula based on SCr of 1.64 mg/dL (H)). Liver Function Tests: Recent Labs  Lab 04/09/20 0404  AST 46*  ALT 22  ALKPHOS 44  BILITOT 1.1  PROT 6.3*  ALBUMIN 3.3*   No results for input(s): LIPASE, AMYLASE in the last 168 hours. No results for input(s): AMMONIA in the last 168 hours. Coagulation Profile: No results for input(s): INR, PROTIME in the last 168 hours. Cardiac Enzymes: Recent Labs  Lab 04/08/20 2315  CKTOTAL 351   BNP (last 3 results) No results for input(s): PROBNP in the last 8760 hours. HbA1C: Recent Labs    04/08/20 2315  HGBA1C 7.0*   CBG: Recent Labs  Lab 04/09/20 0803 04/09/20 1131 04/09/20 1713 04/09/20 2033 04/10/20 0724  GLUCAP 162* 156* 128* 134* 160*   Lipid Profile: No results for input(s): CHOL, HDL, LDLCALC, TRIG, CHOLHDL, LDLDIRECT in the last 72 hours. Thyroid Function Tests: Recent Labs    04/09/20 0404  TSH 5.414*   Anemia Panel: No results for input(s): VITAMINB12, FOLATE, FERRITIN, TIBC, IRON, RETICCTPCT in the last 72 hours. Urine analysis:    Component Value Date/Time   COLORURINE AMBER (A) 04/08/2020 2223   APPEARANCEUR CLOUDY (A) 04/08/2020 2223   LABSPEC 1.018 04/08/2020 2223   PHURINE 5.0 04/08/2020 2223   GLUCOSEU 50 (A) 04/08/2020 2223    HGBUR LARGE (A) 04/08/2020 2223   BILIRUBINUR NEGATIVE 04/08/2020 2223   KETONESUR NEGATIVE 04/08/2020 2223   PROTEINUR 100 (A) 04/08/2020 2223   NITRITE NEGATIVE 04/08/2020 2223   LEUKOCYTESUR MODERATE (A) 04/08/2020 2223   Sepsis Labs: @LABRCNTIP (procalcitonin:4,lacticidven:4) ) Recent Results (from the past 240 hour(s))  Culture, Urine     Status: Abnormal (Preliminary result)   Collection Time: 04/08/20 10:23 PM   Specimen: Urine, Random  Result Value Ref Range Status   Specimen Description URINE, RANDOM  Final   Special Requests Normal  Final   Culture (A)  Final    10,000 COLONIES/mL ESCHERICHIA COLI SUSCEPTIBILITIES TO FOLLOW Performed at [facility], [place] [address] [place], [place] [address]    Report Status PENDING  Incomplete  MRSA PCR Screening     Status: None   Collection Time: 04/09/20  7:32 AM   Specimen: Nasal Mucosa; Nasopharyngeal  Result Value Ref Range Status   MRSA by PCR NEGATIVE NEGATIVE Final    Comment:        The GeneXpert MRSA Assay (FDA approved for NASAL specimens only), is one component of a comprehensive MRSA colonization surveillance program. It is not intended to diagnose MRSA infection nor to guide or monitor treatment for MRSA infections. Performed at [facility], [place] [address], [place], [place] [address]          Radiology Studies: ECHOCARDIOGRAM COMPLETE  Result Date: 04/09/2020    ECHOCARDIOGRAM REPORT   Patient Name:   YAHSIR WICKENS Date of Exam: 04/09/2020 Medical Rec #:  [id number]      Height:       71.0 in Accession #:    [id number]     Weight:       200.0 lb Date of Birth:  [birth year]     BSA:          2.108 m Patient Age:    89 years       BP:           142/63 mmHg Patient Gender: M              HR:  69 bpm. Exam Location:  Inpatient Procedure: 2D Echo, Cardiac Doppler and Color Doppler Indications:    Elevated Troponin  History:        Patient has prior history of Echocardiogram examinations, most                  recent 01/11/2019. CHF, Previous Myocardial Infarction, Prior                 CABG, COPD and Stroke, Arrythmias:Atrial Fibrillation; Risk                 Factors:Hypertension, Diabetes and Dyslipidemia. Primary cancer                 of left lower lobe of lung,Tobacco abuse.  Sonographer:    Alvino Chapel RCS Referring Phys: [id number] [place]  1. Left ventricular ejection fraction, by estimation, is 35 to 40%. The left ventricle has moderately decreased function. The left ventricle demonstrates global hypokinesis with regional wall motion abnormalities (see scoring diagram/findings for description). There is mild left ventricular hypertrophy. Left ventricular diastolic parameters are consistent with Grade III diastolic dysfunction (restrictive). There is the interventricular septum is flattened in systole and diastole, consistent with right ventricular pressure and volume overload.  2. Right ventricular systolic function is normal. The right ventricular size is normal. There is moderately elevated pulmonary artery systolic pressure. The estimated right ventricular systolic pressure is 26.7 mmHg.  3. Left atrial size was moderately dilated.  4. Right atrial size was mild to moderately dilated.  5. The mitral valve is abnormal. Mild mitral valve regurgitation.  6. The aortic valve is tricuspid. Aortic valve regurgitation is not visualized. Mild to moderate aortic valve sclerosis/calcification is present, without any evidence of aortic stenosis.  7. The inferior vena cava is dilated in size with <50% respiratory variability, suggesting right atrial pressure of 15 mmHg. FINDINGS  Left Ventricle: Left ventricular ejection fraction, by estimation, is 35 to 40%. The left ventricle has moderately decreased function. The left ventricle demonstrates regional wall motion abnormalities. The left ventricular internal cavity size was normal in size. There is mild left ventricular hypertrophy. The  interventricular septum is flattened in systole and diastole, consistent with right ventricular pressure and volume overload. Left ventricular diastolic parameters are consistent with Grade III diastolic dysfunction (restrictive).  LV Wall Scoring: The basal inferior segment is akinetic. Right Ventricle: The right ventricular size is normal. No increase in right ventricular wall thickness. Right ventricular systolic function is normal. There is moderately elevated pulmonary artery systolic pressure. The tricuspid regurgitant velocity is 3.20 m/s, and with an assumed right atrial pressure of 15 mmHg, the estimated right ventricular systolic pressure is 12.4 mmHg. Left Atrium: Left atrial size was moderately dilated. Right Atrium: Right atrial size was mild to moderately dilated. Pericardium: There is no evidence of pericardial effusion. Mitral Valve: The mitral valve is abnormal. There is mild thickening of the mitral valve leaflet(s). There is mild calcification of the mitral valve leaflet(s). Moderate mitral annular calcification. Mild mitral valve regurgitation. Tricuspid Valve: The tricuspid valve is grossly normal. Tricuspid valve regurgitation is mild. Aortic Valve: The aortic valve is tricuspid. Aortic valve regurgitation is not visualized. Mild to moderate aortic valve sclerosis/calcification is present, without any evidence of aortic stenosis. Moderate aortic valve annular calcification. Aortic valve mean gradient measures 8.0 mmHg. Aortic valve peak gradient measures 14.5 mmHg. Aortic valve area, by VTI measures 1.03 cm. Pulmonic Valve: The pulmonic valve was grossly normal. Pulmonic valve regurgitation  is trivial. Aorta: The aortic root is normal in size and structure. Venous: The inferior vena cava is dilated in size with less than 50% respiratory variability, suggesting right atrial pressure of 15 mmHg. IAS/Shunts: No atrial level shunt detected by color flow Doppler.  LEFT VENTRICLE PLAX 2D LVIDd:          5.00 cm      Diastology LVIDs:         4.30 cm      LV e' medial:   3.49 cm/s LV PW:         1.30 cm      LV E/e' medial: 45.8 LV IVS:        1.00 cm LVOT diam:     1.90 cm LV SV:         46 LV SV Index:   22 LVOT Area:     2.84 cm  LV Volumes (MOD) LV vol d, MOD A2C: 102.0 ml LV vol d, MOD A4C: 99.9 ml LV vol s, MOD A2C: 50.5 ml LV vol s, MOD A4C: 62.6 ml LV SV MOD A2C:     51.5 ml LV SV MOD A4C:     99.9 ml LV SV MOD BP:      46.1 ml RIGHT VENTRICLE TAPSE (M-mode): 0.9 cm LEFT ATRIUM             Index       RIGHT ATRIUM           Index LA diam:        5.00 cm 2.37 cm/m  RA Area:     24.20 cm LA Vol (A2C):   88.9 ml 42.17 ml/m RA Volume:   84.30 ml  39.98 ml/m LA Vol (A4C):   92.8 ml 44.02 ml/m LA Biplane Vol: 98.2 ml 46.58 ml/m  AORTIC VALVE AV Area (Vmax):    1.23 cm AV Area (Vmean):   1.12 cm AV Area (VTI):     1.03 cm AV Vmax:           190.50 cm/s AV Vmean:          132.500 cm/s AV VTI:            0.450 m AV Peak Grad:      14.5 mmHg AV Mean Grad:      8.0 mmHg LVOT Vmax:         82.50 cm/s LVOT Vmean:        52.200 cm/s LVOT VTI:          0.163 m LVOT/AV VTI ratio: 0.36  AORTA Ao Root diam: 3.30 cm MITRAL VALVE                TRICUSPID VALVE MV Area (PHT): 4.39 cm     TR Peak grad:   41.0 mmHg MV Decel Time: 173 msec     TR Vmax:        320.00 cm/s MV E velocity: 160.00 cm/s                             SHUNTS                             Systemic VTI:  0.16 m  Systemic Diam: 1.90 cm Rozann Lesches MD Electronically signed by Rozann Lesches MD Signature Date/Time: 04/09/2020/1:33:14 PM    Final       Scheduled Meds: . aspirin EC  81 mg Oral Daily  . carvedilol  6.25 mg Oral BID WC  . docusate sodium  100 mg Oral BID  . ezetimibe  10 mg Oral Daily  . fluticasone furoate-vilanterol  1 puff Inhalation Daily  . guaiFENesin  600 mg Oral BID  . insulin aspart  0-5 Units Subcutaneous QHS  . insulin aspart  0-9 Units Subcutaneous TID WC  . ipratropium-albuterol  3  mL Nebulization QID  . sodium chloride flush  3 mL Intravenous Q12H  . sodium chloride flush  3 mL Intravenous Q12H   Continuous Infusions: . sodium chloride    . heparin       LOS: 1 day      Debbe Odea, MD [facility] Pager: [email or web address] 04/10/2020, 10:20 AM

## 2020-04-10 NOTE — Progress Notes (Signed)
Progress Note  Patient Name: DEMPSEY AHONEN Date of Encounter: 04/10/2020  Primary Cardiologist: Carlyle Dolly, MD   Subjective   Mild cough.  No real chest pain.  No further vomiting. Went to hospital originally after rolling over in bed to vomit and fell. Troponin was 8000.   Inpatient Medications    Scheduled Meds: . apixaban  5 mg Oral BID  . aspirin EC  81 mg Oral Daily  . carvedilol  6.25 mg Oral BID WC  . docusate sodium  100 mg Oral BID  . ezetimibe  10 mg Oral Daily  . fluticasone furoate-vilanterol  1 puff Inhalation Daily  . guaiFENesin  600 mg Oral BID  . insulin aspart  0-5 Units Subcutaneous QHS  . insulin aspart  0-9 Units Subcutaneous TID WC  . ipratropium-albuterol  3 mL Nebulization QID  . sodium chloride flush  3 mL Intravenous Q12H  . sodium chloride flush  3 mL Intravenous Q12H   Continuous Infusions: . sodium chloride     PRN Meds: sodium chloride, acetaminophen **OR** acetaminophen, ondansetron **OR** ondansetron (ZOFRAN) IV, sodium chloride flush   Vital Signs    Vitals:   04/09/20 1941 04/10/20 0006 04/10/20 0443 04/10/20 0727  BP: 128/60 128/66 129/77 125/60  Pulse: 72 68 70 94  Resp:    17  Temp: 98 F (36.7 C) 98 F (36.7 C) 98 F (36.7 C) (!) 97.5 F (36.4 C)  TempSrc: Oral Oral Oral Oral  SpO2:    93%  Weight:   90.5 kg     Intake/Output Summary (Last 24 hours) at 04/10/2020 0921 Last data filed at 04/10/2020 0700 Gross per 24 hour  Intake 600 ml  Output 750 ml  Net -150 ml   Last 3 Weights 04/10/2020 04/09/2020 04/08/2020  Weight (lbs) 199 lb 8.3 oz 199 lb 15.3 oz 199 lb 15.3 oz  Weight (kg) 90.5 kg 90.7 kg 90.7 kg      Telemetry    No adverse arrhythmia, atrial fibrillation well rate controlled- Personally Reviewed  ECG    Atrial fibrillation left bundle branch block 80s- Personally Reviewed  Physical Exam   GEN: No acute distress.  Elderly Neck: No JVD Cardiac:  Irregularly irregular normal rate, soft  systolic murmur, no rubs, or gallops.  Respiratory:  Wheezes bilaterally. GI: Soft, nontender, non-distended  MS: No edema; No deformity. Neuro:  Nonfocal  Psych: Normal affect   Labs    High Sensitivity Troponin:   Recent Labs  Lab 04/08/20 2047 04/08/20 2315 04/09/20 0404 04/09/20 0445  TROPONINIHS 8,033* 7,420* 7,355* 7,107*      Chemistry Recent Labs  Lab 04/09/20 0404 04/10/20 0338  NA 142 140  K 4.8 4.4  CL 106 107  CO2 28 26  GLUCOSE 189* 137*  BUN 36* 46*  CREATININE 1.69* 1.64*  CALCIUM 9.3 9.4  PROT 6.3*  --   ALBUMIN 3.3*  --   AST 46*  --   ALT 22  --   ALKPHOS 44  --   BILITOT 1.1  --   GFRNONAA 36* 38*  GFRAA 42* 44*  ANIONGAP 8 7     Hematology Recent Labs  Lab 04/09/20 0404 04/10/20 0338  WBC 16.6* 10.8*  RBC 3.66* 3.74*  HGB 11.4* 11.7*  HCT 36.5* 36.9*  MCV 99.7 98.7  MCH 31.1 31.3  MCHC 31.2 31.7  RDW 13.7 13.6  PLT 122* 111*    BNP Recent Labs  Lab 04/08/20 2315  BNP 2,748.2*  DDimer No results for input(s): DDIMER in the last 168 hours.   Radiology    ECHOCARDIOGRAM COMPLETE  Result Date: 04/09/2020    ECHOCARDIOGRAM REPORT   Patient Name:   JUDDSON COBERN Date of Exam: 04/09/2020 Medical Rec #:  [id number]      Height:       71.0 in Accession #:    [id number]     Weight:       200.0 lb Date of Birth:  Apr 23, 1932     BSA:          2.108 m Patient Age:    86 years       BP:           142/63 mmHg Patient Gender: M              HR:           69 bpm. Exam Location:  Inpatient Procedure: 2D Echo, Cardiac Doppler and Color Doppler Indications:    Elevated Troponin  History:        Patient has prior history of Echocardiogram examinations, most                 recent 01/11/2019. CHF, Previous Myocardial Infarction, Prior                 CABG, COPD and Stroke, Arrythmias:Atrial Fibrillation; Risk                 Factors:Hypertension, Diabetes and Dyslipidemia. Primary cancer                 of left lower lobe of lung,Tobacco abuse.   Sonographer:    Alvino Chapel RCS Referring Phys: [id number] [place]  1. Left ventricular ejection fraction, by estimation, is 35 to 40%. The left ventricle has moderately decreased function. The left ventricle demonstrates global hypokinesis with regional wall motion abnormalities (see scoring diagram/findings for description). There is mild left ventricular hypertrophy. Left ventricular diastolic parameters are consistent with Grade III diastolic dysfunction (restrictive). There is the interventricular septum is flattened in systole and diastole, consistent with right ventricular pressure and volume overload.  2. Right ventricular systolic function is normal. The right ventricular size is normal. There is moderately elevated pulmonary artery systolic pressure. The estimated right ventricular systolic pressure is 40.9 mmHg.  3. Left atrial size was moderately dilated.  4. Right atrial size was mild to moderately dilated.  5. The mitral valve is abnormal. Mild mitral valve regurgitation.  6. The aortic valve is tricuspid. Aortic valve regurgitation is not visualized. Mild to moderate aortic valve sclerosis/calcification is present, without any evidence of aortic stenosis.  7. The inferior vena cava is dilated in size with <50% respiratory variability, suggesting right atrial pressure of 15 mmHg. FINDINGS  Left Ventricle: Left ventricular ejection fraction, by estimation, is 35 to 40%. The left ventricle has moderately decreased function. The left ventricle demonstrates regional wall motion abnormalities. The left ventricular internal cavity size was normal in size. There is mild left ventricular hypertrophy. The interventricular septum is flattened in systole and diastole, consistent with right ventricular pressure and volume overload. Left ventricular diastolic parameters are consistent with Grade III diastolic dysfunction (restrictive).  LV Wall Scoring: The basal inferior segment is akinetic.  Right Ventricle: The right ventricular size is normal. No increase in right ventricular wall thickness. Right ventricular systolic function is normal. There is moderately elevated pulmonary artery systolic pressure. The tricuspid regurgitant velocity is 3.20 m/s, and with an  assumed right atrial pressure of 15 mmHg, the estimated right ventricular systolic pressure is 96.7 mmHg. Left Atrium: Left atrial size was moderately dilated. Right Atrium: Right atrial size was mild to moderately dilated. Pericardium: There is no evidence of pericardial effusion. Mitral Valve: The mitral valve is abnormal. There is mild thickening of the mitral valve leaflet(s). There is mild calcification of the mitral valve leaflet(s). Moderate mitral annular calcification. Mild mitral valve regurgitation. Tricuspid Valve: The tricuspid valve is grossly normal. Tricuspid valve regurgitation is mild. Aortic Valve: The aortic valve is tricuspid. Aortic valve regurgitation is not visualized. Mild to moderate aortic valve sclerosis/calcification is present, without any evidence of aortic stenosis. Moderate aortic valve annular calcification. Aortic valve mean gradient measures 8.0 mmHg. Aortic valve peak gradient measures 14.5 mmHg. Aortic valve area, by VTI measures 1.03 cm. Pulmonic Valve: The pulmonic valve was grossly normal. Pulmonic valve regurgitation is trivial. Aorta: The aortic root is normal in size and structure. Venous: The inferior vena cava is dilated in size with less than 50% respiratory variability, suggesting right atrial pressure of 15 mmHg. IAS/Shunts: No atrial level shunt detected by color flow Doppler.  LEFT VENTRICLE PLAX 2D LVIDd:         5.00 cm      Diastology LVIDs:         4.30 cm      LV e' medial:   3.49 cm/s LV PW:         1.30 cm      LV E/e' medial: 45.8 LV IVS:        1.00 cm LVOT diam:     1.90 cm LV SV:         46 LV SV Index:   22 LVOT Area:     2.84 cm  LV Volumes (MOD) LV vol d, MOD A2C: 102.0 ml LV vol  d, MOD A4C: 99.9 ml LV vol s, MOD A2C: 50.5 ml LV vol s, MOD A4C: 62.6 ml LV SV MOD A2C:     51.5 ml LV SV MOD A4C:     99.9 ml LV SV MOD BP:      46.1 ml RIGHT VENTRICLE TAPSE (M-mode): 0.9 cm LEFT ATRIUM             Index       RIGHT ATRIUM           Index LA diam:        5.00 cm 2.37 cm/m  RA Area:     24.20 cm LA Vol (A2C):   88.9 ml 42.17 ml/m RA Volume:   84.30 ml  39.98 ml/m LA Vol (A4C):   92.8 ml 44.02 ml/m LA Biplane Vol: 98.2 ml 46.58 ml/m  AORTIC VALVE AV Area (Vmax):    1.23 cm AV Area (Vmean):   1.12 cm AV Area (VTI):     1.03 cm AV Vmax:           190.50 cm/s AV Vmean:          132.500 cm/s AV VTI:            0.450 m AV Peak Grad:      14.5 mmHg AV Mean Grad:      8.0 mmHg LVOT Vmax:         82.50 cm/s LVOT Vmean:        52.200 cm/s LVOT VTI:          0.163 m LVOT/AV VTI ratio: 0.36  AORTA Ao Root  diam: 3.30 cm MITRAL VALVE                TRICUSPID VALVE MV Area (PHT): 4.39 cm     TR Peak grad:   41.0 mmHg MV Decel Time: 173 msec     TR Vmax:        320.00 cm/s MV E velocity: 160.00 cm/s                             SHUNTS                             Systemic VTI:  0.16 m                             Systemic Diam: 1.90 cm Rozann Lesches MD Electronically signed by Rozann Lesches MD Signature Date/Time: 04/09/2020/1:33:14 PM    Final     Cardiac Studies   Echo-EF 30 to 35% relatively unchanged from prior echo.  Personally reviewed.  Dyssynchronous contraction.  Patient Profile     86 y.o. male with non-ST elevation myocardial infarction, presentation of vomiting, no serious chest pain episode with coronary disease prior CABG with patent LIMA to LAD, occluded radial graft OM1 OM to PDA, occluded RCA with left-to-right collaterals.  Assessment & Plan    Non-ST elevation myocardial infarction Ischemic cardiomyopathy, chronic systolic heart failure Mild aortic stenosis-heavily calcified aortic valve Persistent atrial fibrillation-well rate controlled Chronic  anticoagulation-Eliquis-last dose 8 AM this morning -Atypical presentation for non-ST elevation myocardial infarction.  Mainly consisted of GI symptoms, vomiting.  Did not have any chest discomfort.  He was originally scheduled to have Lexiscan stress test this morning however with his troponin elevation of 8000, I felt uncomfortable stressing his heart at this time. -I discussed with patient proceeding with cardiac catheterization, he is willing to proceed.  He did receive Eliquis dose this morning 04/10/2020 at 8 AM.  We will hold.  This afternoon we will start heparin IV.  Tomorrow early afternoon we can perform heart catheterization.  Risks and benefits of been explained including stroke heart attack death renal impairment bleeding.  His creatinine is 1.6, previously in the 1.4 range.  His Entresto moderate dose 49/51 is currently on hold.  His Lasix is currently on hold.  He does have wheezes on exam but he is able to lay flat without any difficulty.  Post catheterization we will go ahead and resume these medications. -Ultimately given his reduced ejection fraction, significantly elevated troponin, advanced age and known underlying coronary artery disease with occluded left radial graft, he is at high risk for further morbidity and mortality.  Critical care time-35 minutes spent with discussion with care team, nuclear cardiology team, and this gentleman with multiple comorbidities with non-ST elevation myocardial infarction.      For questions or updates, please contact [place] Please consult [email or web address] for contact info under        Signed, Candee Furbish, MD  04/10/2020, 9:21 AM

## 2020-04-11 ENCOUNTER — Encounter (HOSPITAL_COMMUNITY): Payer: Self-pay | Admitting: Internal Medicine

## 2020-04-11 ENCOUNTER — Encounter (HOSPITAL_COMMUNITY)
Admission: AD | Disposition: A | Discharge status: Home Health | Payer: Self-pay | Source: Other Acute Inpatient Hospital | Attending: Internal Medicine

## 2020-04-11 DIAGNOSIS — R778 Other specified abnormalities of plasma proteins: Secondary | ICD-10-CM

## 2020-04-11 DIAGNOSIS — N183 Chronic kidney disease, stage 3 unspecified: Secondary | ICD-10-CM

## 2020-04-11 DIAGNOSIS — Z720 Tobacco use: Secondary | ICD-10-CM

## 2020-04-11 DIAGNOSIS — I2510 Atherosclerotic heart disease of native coronary artery without angina pectoris: Secondary | ICD-10-CM

## 2020-04-11 DIAGNOSIS — E785 Hyperlipidemia, unspecified: Secondary | ICD-10-CM

## 2020-04-11 DIAGNOSIS — I5041 Acute combined systolic (congestive) and diastolic (congestive) heart failure: Secondary | ICD-10-CM

## 2020-04-11 HISTORY — PX: LEFT HEART CATH AND CORS/GRAFTS ANGIOGRAPHY: CATH118250

## 2020-04-11 LAB — BASIC METABOLIC PANEL
Anion gap: 9 (ref 5–15)
BUN: 37 mg/dL — ABNORMAL HIGH (ref 8–23)
CO2: 26 mmol/L (ref 22–32)
Calcium: 9.4 mg/dL (ref 8.9–10.3)
Chloride: 104 mmol/L (ref 98–111)
Creatinine, Ser: 1.52 mg/dL — ABNORMAL HIGH (ref 0.61–1.24)
GFR calc Af Amer: 48 mL/min — ABNORMAL LOW (ref >60)
GFR calc non Af Amer: 41 mL/min — ABNORMAL LOW (ref >60)
Glucose, Bld: 142 mg/dL — ABNORMAL HIGH (ref 70–99)
Potassium: 4.8 mmol/L (ref 3.5–5.1)
Sodium: 139 mmol/L (ref 135–145)

## 2020-04-11 LAB — URINE CULTURE
Culture: 10000 — AB
Special Requests: NORMAL

## 2020-04-11 LAB — CBC
HCT: 36.8 % — ABNORMAL LOW (ref 39.0–52.0)
Hemoglobin: 11.6 g/dL — ABNORMAL LOW (ref 13.0–17.0)
MCH: 31.1 pg (ref 26.0–34.0)
MCHC: 31.5 g/dL (ref 30.0–36.0)
MCV: 98.7 fL (ref 80.0–100.0)
Platelets: 117 10*3/uL — ABNORMAL LOW (ref 150–400)
RBC: 3.73 MIL/uL — ABNORMAL LOW (ref 4.22–5.81)
RDW: 13.5 % (ref 11.5–15.5)
WBC: 7.9 10*3/uL (ref 4.0–10.5)
nRBC: 0 % (ref 0.0–0.2)

## 2020-04-11 LAB — HEPARIN LEVEL (UNFRACTIONATED)
Heparin Unfractionated: 2.08 IU/mL — ABNORMAL HIGH (ref 0.30–0.70)
Heparin Unfractionated: 1.86 IU/mL — ABNORMAL HIGH (ref 0.30–0.70)

## 2020-04-11 LAB — APTT
aPTT: 72 seconds — ABNORMAL HIGH (ref 24–36)
aPTT: 76 seconds — ABNORMAL HIGH (ref 24–36)

## 2020-04-11 LAB — GLUCOSE, CAPILLARY
Glucose-Capillary: 129 mg/dL — ABNORMAL HIGH (ref 70–99)
Glucose-Capillary: 116 mg/dL — ABNORMAL HIGH (ref 70–99)
Glucose-Capillary: 142 mg/dL — ABNORMAL HIGH (ref 70–99)

## 2020-04-11 SURGERY — LEFT HEART CATH AND CORS/GRAFTS ANGIOGRAPHY
Anesthesia: LOCAL

## 2020-04-11 MED ORDER — LIDOCAINE HCL (PF) 1 % IJ SOLN
INTRAMUSCULAR | Status: AC
  Filled 2020-04-11: qty 30

## 2020-04-11 MED ORDER — FLUTICASONE FUROATE-VILANTEROL 100-25 MCG/INH IN AEPB: 1.0000 | INHALATION_SPRAY | Freq: Every day | RESPIRATORY_TRACT | 0 refills | Status: DC

## 2020-04-11 MED ORDER — ALBUTEROL SULFATE HFA 108 (90 BASE) MCG/ACT IN AERS
2.0000 | INHALATION_SPRAY | Freq: Four times a day (QID) | RESPIRATORY_TRACT | 0 refills | Status: DC | PRN
Start: 2020-04-11 — End: 2020-04-11

## 2020-04-11 MED ORDER — NITROGLYCERIN 0.3 MG SL SUBL
0.3000 mg | SUBLINGUAL_TABLET | SUBLINGUAL | 3 refills | Status: DC | PRN
Start: 2020-04-11 — End: 2021-08-27

## 2020-04-11 MED ORDER — LABETALOL HCL 5 MG/ML IV SOLN: 10.0000 mg | INTRAVENOUS | Status: DC | PRN

## 2020-04-11 MED ORDER — SODIUM CHLORIDE 0.9% FLUSH: 3.0000 mL | INTRAVENOUS | Status: DC | PRN

## 2020-04-11 MED ORDER — HEPARIN (PORCINE) IN NACL 1000-0.9 UT/500ML-% IV SOLN
INTRAVENOUS | Status: DC | PRN
  Administered 2020-04-11 (×2): 500 mL

## 2020-04-11 MED ORDER — NITROGLYCERIN 0.3 MG SL SUBL
0.3000 mg | SUBLINGUAL_TABLET | SUBLINGUAL | 3 refills | Status: DC | PRN
Start: 2020-04-11 — End: 2020-04-11

## 2020-04-11 MED ORDER — IOHEXOL 350 MG/ML SOLN
INTRAVENOUS | Status: DC | PRN
  Administered 2020-04-11: 45 mL

## 2020-04-11 MED ORDER — SODIUM CHLORIDE 0.9 % IV SOLN: INTRAVENOUS | Status: DC

## 2020-04-11 MED ORDER — ASPIRIN 81 MG PO TBEC: 81.0000 mg | DELAYED_RELEASE_TABLET | Freq: Every day | ORAL | 0 refills | Status: DC

## 2020-04-11 MED ORDER — SODIUM CHLORIDE 0.9 % IV SOLN: 250.0000 mL | INTRAVENOUS | Status: DC | PRN

## 2020-04-11 MED ORDER — GUAIFENESIN ER 600 MG PO TB12: 600.0000 mg | ORAL_TABLET | Freq: Two times a day (BID) | ORAL | 0 refills | Status: DC

## 2020-04-11 MED ORDER — HEPARIN (PORCINE) IN NACL 1000-0.9 UT/500ML-% IV SOLN
INTRAVENOUS | Status: AC
  Filled 2020-04-11: qty 500

## 2020-04-11 MED ORDER — IPRATROPIUM-ALBUTEROL 0.5-2.5 (3) MG/3ML IN SOLN: 3.0000 mL | Freq: Two times a day (BID) | RESPIRATORY_TRACT | Status: DC

## 2020-04-11 MED ORDER — FENTANYL CITRATE (PF) 100 MCG/2ML IJ SOLN
INTRAMUSCULAR | Status: AC
  Filled 2020-04-11: qty 2

## 2020-04-11 MED ORDER — HYDRALAZINE HCL 20 MG/ML IJ SOLN: 10.0000 mg | INTRAMUSCULAR | Status: DC | PRN

## 2020-04-11 MED ORDER — IOHEXOL 350 MG/ML SOLN
INTRAVENOUS | Status: AC
  Filled 2020-04-11: qty 1

## 2020-04-11 MED ORDER — FENTANYL CITRATE (PF) 100 MCG/2ML IJ SOLN
INTRAMUSCULAR | Status: DC | PRN
  Administered 2020-04-11: 25 ug via INTRAVENOUS

## 2020-04-11 MED ORDER — MIDAZOLAM HCL 2 MG/2ML IJ SOLN
INTRAMUSCULAR | Status: DC | PRN
  Administered 2020-04-11: 1 mg via INTRAVENOUS

## 2020-04-11 MED ORDER — MIDAZOLAM HCL 2 MG/2ML IJ SOLN
INTRAMUSCULAR | Status: AC
  Filled 2020-04-11: qty 2

## 2020-04-11 MED ORDER — SODIUM CHLORIDE 0.9% FLUSH: 3.0000 mL | Freq: Two times a day (BID) | INTRAVENOUS | Status: DC

## 2020-04-11 MED ORDER — LIDOCAINE HCL (PF) 1 % IJ SOLN
INTRAMUSCULAR | Status: DC | PRN
  Administered 2020-04-11: 20 mL

## 2020-04-11 MED ORDER — ALBUTEROL SULFATE HFA 108 (90 BASE) MCG/ACT IN AERS
2.0000 | INHALATION_SPRAY | Freq: Four times a day (QID) | RESPIRATORY_TRACT | 0 refills | Status: AC | PRN
Start: 2020-04-11 — End: ?

## 2020-04-11 SURGICAL SUPPLY — 10 items
CATH INFINITI 5 FR IM (CATHETERS) ×1 IMPLANT
CATH INFINITI 5FR MULTPACK ANG (CATHETERS) ×1 IMPLANT
KIT HEART LEFT (KITS) ×2 IMPLANT
PACK CARDIAC CATHETERIZATION (CUSTOM PROCEDURE TRAY) ×2 IMPLANT
SHEATH PINNACLE 5F 10CM (SHEATH) ×1 IMPLANT
SHEATH PROBE COVER 6X72 (BAG) ×1 IMPLANT
TRANSDUCER W/STOPCOCK (MISCELLANEOUS) ×2 IMPLANT
TUBING CIL FLEX 10 FLL-RA (TUBING) ×2 IMPLANT
WIRE EMERALD 3MM-J .035X150CM (WIRE) ×1 IMPLANT
WIRE EMERALD 3MM-J .035X260CM (WIRE) ×1 IMPLANT

## 2020-04-11 NOTE — TOC Initial Note (Signed)
Transition of Care [facility]) - Initial/Assessment Note    Patient Details  Name: Gary Odonnell MRN: [id number] Date of Birth: [birth year]  [facility] CM/SW Contact:    Bethena Roys, RN Phone Number: 04/11/2020, 4:44 PM  Clinical Narrative:  Patient presented for fall. Prior to arrival patient was from home with support of wife and children. Plan will be for patient to return home with home health services. Case Manager spoke with patient and he did not have preference for services. Medicare.gov list provided. Case Manager received verbal permission from patient to call wife. Referral sent to [facility]- start of care to begin within 24-48 hours post transition home. Patient does not need durable medical equipment at this time. Patient states he has rolling walker, wheelchair, ramp, and shower chair. Wife to transport patient home via private vehicle. No further needs from Case Manager at this time.               Expected Discharge Plan: [place] Barriers to Discharge: No Barriers Identified   Patient Goals and CMS Choice Patient states their goals for this hospitalization and ongoing recovery are:: to return home CMS Medicare.gov Compare Post Acute Care list provided to:: Patient Choice offered to / list presented to : Patient(Family did not really have a choice.)  Expected Discharge Plan and Services Expected Discharge Plan: [place] In-house Referral: NA Discharge Planning Services: CM Consult Post Acute Care Choice: [place] arrangements for the past 2 months: Single Family Home Expected Discharge Date: 04/11/20               DME Arranged: N/A    HH Agency: [facility] Date [place]: 04/11/20 Time [place]: [id number] Representative spoke with at [place] Arrangements/Services Living arrangements for the past 2 months: [place]  with:: Spouse Patient language and need for interpreter reviewed:: Yes Do you feel safe going back to the place where you live?: Yes      Need for Family Participation in Patient Care: Yes (Comment) Care giver support system in place?: Yes (comment) Current home services: DME(patient has all necessary DME) Criminal Activity/Legal Involvement Pertinent to Current Situation/Hospitalization: No - Comment as needed  Activities of Daily Living Home Assistive Devices/Equipment: Cane (specify quad or straight), Walker (specify type) ADL Screening (condition at time of admission) Patient's cognitive ability adequate to safely complete daily activities?: Yes Is the patient deaf or have difficulty hearing?: No Does the patient have difficulty seeing, even when wearing glasses/contacts?: No Does the patient have difficulty concentrating, remembering, or making decisions?: No Patient able to express need for assistance with ADLs?: Yes Does the patient have difficulty dressing or bathing?: No Independently performs ADLs?: Yes (appropriate for developmental age) Does the patient have difficulty walking or climbing stairs?: Yes Weakness of Legs: Both Weakness of Arms/Hands: None  Permission Sought/Granted Permission sought to share information with : Family Supports, Chartered certified accountant granted to share information with : Yes, Verbal Permission Granted     Permission granted to share info w AGENCY: [facility]        Emotional Assessment Appearance:: Appears stated age Attitude/Demeanor/Rapport: Engaged Affect (typically observed): Accepting Orientation: : Oriented to Self, Oriented to Place, Oriented to  Time, Oriented to Situation Alcohol / Substance Use: Not Applicable Psych Involvement: No (comment)  Admission diagnosis:  NSTEMI (non-ST elevated myocardial infarction) [facility]) [I21.4] Patient Active Problem  List   Diagnosis Date Noted  . Elevated troponin   . Pressure  injury of skin 04/10/2020  . Hx of completed stroke 04/08/2020  . NSTEMI (non-ST elevated myocardial infarction) ([place]) 04/08/2020  . Exposure to influenza 01/18/2019  . Acute urinary retention 01/18/2019  . Acute on chronic systolic heart failure ([place])   . CHF (congestive heart failure) ([place]) 01/11/2019  . Pneumonia 01/10/2019  . Acute renal failure superimposed on stage 3 chronic kidney disease ([place]) 01/10/2019  . Diet-controlled diabetes mellitus ([place]) 01/10/2019  . COPD with acute exacerbation ([place]) 01/05/2019  . A-fib ([place]) 01/05/2019  . Tobacco abuse   . Left shoulder pain 09/01/2018  . Iliac artery stenosis, left ([place]) 04/22/2015  . PAD (peripheral artery disease) ([place]) 04/21/2015  . Primary cancer of left lower lobe of lung ([place]) 02/02/2015  . Abnormal PET scan of colon 01/04/2015  . Solitary pulmonary nodule on lung CT   . Carpal tunnel syndrome, left 05/03/2014  . Acute renal failure ([place]) 09/27/2013  . HTN (hypertension) 09/26/2013  . Chronic combined systolic and diastolic CHF (congestive heart failure) ([place]) 09/26/2013  . CVA (cerebral infarction) 09/26/2013  . CAP (community acquired pneumonia) 09/26/2013  . CAD (coronary atherosclerotic disease) 09/26/2013  . S/P CABG x 4 09/26/2013   PCP:  Glenda Chroman, MD Pharmacy:   [facility] [place] [address] [place] [address] Phone: [phone] Fax: [phone]  [facility] [place], [place] - [place] [address] [place] [address] Phone: [phone] Fax: [phone]   Readmission Risk Interventions No flowsheet data found.

## 2020-04-11 NOTE — Progress Notes (Signed)
Cath lab notified will be taking patient for cardiac catheterization, heparin gtt IV stopped, patient notified.

## 2020-04-11 NOTE — Progress Notes (Signed)
PT Cancellation Note  Patient Details Name: KNOLAN SIMIEN MRN: [id number] DOB: May 17, 1932   Cancelled Treatment:    Reason Eval/Treat Not Completed: (P) Patient at procedure or test/unavailable Pt is off floor for cardiac catheterization. PT will follow back for treatment tomorrow.    Tanyla Stege B. Migdalia Dk PT, DPT Acute Rehabilitation Services Pager [phone] Office [phone]  [place] 04/11/2020, 1:05 PM

## 2020-04-11 NOTE — Progress Notes (Signed)
Patient completed 4 hours of BR.  Assisted oob and ambulated to door with walker.  Right groin remains level zero, denies SOB/CP.  Reviewed d/c instructions with patient and wife, verbalized understanding. To be d/ced via [facility] with wife.

## 2020-04-11 NOTE — Progress Notes (Signed)
Site area: right groin  Site Prior to Removal:  Level 0  Pressure Applied For 20 MINUTES    Minutes Beginning at 1350  Manual:   Yes.    Patient Status During Pull:  Stable  Post Pull Groin Site:  Level 0  Post Pull Instructions Given:  Yes.    Post Pull Pulses Present:  Yes.    Dressing Applied:  Yes.    Comments:  Bed rest started at 1410 X 4 hr.

## 2020-04-11 NOTE — Progress Notes (Signed)
ANTICOAGULATION CONSULT NOTE  Pharmacy Consult for heparin  Indication: chest pain/ACS  Assessment: 86 yo male with NSTEMI and plans for cath. He is on apixaban PTA (last dose 5/17 at 8am). Pharmacy consulted to dose heparin -Hg= 11.7, plt= 111 (history of thrombocytopenia) Initial aPTT therapeutic 72 sec  Goal of Therapy:  Heparin level 0.3-0.7 units/ml aPTT 66-102 seconds Monitor platelets by anticoagulation protocol: Yes   Plan:  -Continue heparin at 1100 units/hr -aPTT and heparin level in 6 hours to confirm  Thanks for allowing pharmacy to be a part of this patient's care.  Excell Seltzer, PharmD Clinical Pharmacist    ANTICOAGULATION CONSULT NOTE - Initial Consult  Pharmacy Consult for heparin Indication: chest pain/ACS  No Known Allergies  Patient Measurements: Height: 5' 11.5" (181.6 cm) Weight: 90.5 kg (199 lb 8.3 oz) IBW/kg (Calculated) : 76.45  Vital Signs: Temp: 98 F (36.7 C) (05/18 1423) Temp Source: Oral (05/18 1423) BP: 141/67 (05/18 1423) Pulse Rate: 70 (05/18 1423)  Labs: Recent Labs    04/08/20 2315 04/09/20 0404 04/09/20 0404 04/09/20 0445 04/10/20 0338 04/11/20 0423 04/11/20 1035  HGB  --  11.4*   < >  --  11.7* 11.6*  --   HCT  --  36.5*  --   --  36.9* 36.8*  --   PLT  --  122*  --   --  111* 117*  --   APTT  --   --   --   --   --  72* 76*  HEPARINUNFRC  --   --   --   --   --  2.08* 1.86*  CREATININE  --  1.69*  --   --  1.64* 1.52*  --   CKTOTAL 351  --   --   --   --   --   --   TROPONINIHS 7,420* 7,355*  --  7,107*  --   --   --    < > = values in this interval not displayed.    Estimated Creatinine Clearance: 38.4 mL/min (A) (by C-G formula based on SCr of 1.52 mg/dL (H)).   Assessment: 86 yo male with NSTEMI and plans for cath. He is on apixaban PTA (last dose 5/17 at 8am). Pharmacy consulted to dose heparin  APTT continues to be at goal on 1100 units/hr, HL still elevated from apixaban.  Goal of Therapy:  Heparin  level 0.3-0.7 units/ml Monitor platelets by anticoagulation protocol: Yes   Plan:  Heparin DC'd by provider   Benetta Spar, PharmD, BCPS, [place] Clinical Pharmacist  Please check AMION for all [place] phone numbers After 10:00 PM, call [place] [phone]

## 2020-04-11 NOTE — Interval H&P Note (Signed)
History and Physical Interval Note:  04/11/2020 12:29 PM  Gary Odonnell  has presented today for surgery, with the diagnosis of Nonstemi.  The various methods of treatment have been discussed with the patient and family. After consideration of risks, benefits and other options for treatment, the patient has consented to  Procedure(s): LEFT HEART CATH AND CORS/GRAFTS ANGIOGRAPHY (N/A) as a surgical intervention.  The patient's history has been reviewed, patient examined, no change in status, stable for surgery.  I have reviewed the patient's chart and labs.  Questions were answered to the patient's satisfaction.    Cath Lab Visit (complete for each Cath Lab visit)  Clinical Evaluation Leading to the Procedure:   ACS: Yes.    Non-ACS:    Anginal Classification: CCS II  Anti-ischemic medical therapy: Minimal Therapy (1 class of medications)  Non-Invasive Test Results: No non-invasive testing performed  Prior CABG: Previous CABG        Lauree Chandler

## 2020-04-11 NOTE — Progress Notes (Signed)
Patient returned from cath lab at 1423hrs. Right femoral site level zero.  Given post cath instructions, patient verbalized understanding.

## 2020-04-11 NOTE — Progress Notes (Addendum)
Progress Note  Patient Name: WENZEL BACKLUND Date of Encounter: 04/11/2020  Primary Cardiologist: Carlyle Dolly, MD   Subjective   Patient reported some SOB this morning which improved after going for a walk. No complaints of chest pain, LE edema, or palpitations. He is anticipating his heart catheterization today.   Inpatient Medications    Scheduled Meds: . aspirin  81 mg Oral Pre-Cath  . [START ON 04/12/2020] aspirin EC  81 mg Oral Daily  . carvedilol  6.25 mg Oral BID WC  . docusate sodium  100 mg Oral BID  . ezetimibe  10 mg Oral Daily  . fluticasone furoate-vilanterol  1 puff Inhalation Daily  . furosemide  40 mg Oral QODAY  . furosemide  60 mg Oral QODAY  . guaiFENesin  600 mg Oral BID  . insulin aspart  0-5 Units Subcutaneous QHS  . insulin aspart  0-9 Units Subcutaneous TID WC  . ipratropium-albuterol  3 mL Nebulization TID  . sodium chloride flush  3 mL Intravenous Q12H  . sodium chloride flush  3 mL Intravenous Q12H  . sodium chloride flush  3 mL Intravenous Q12H   Continuous Infusions: . sodium chloride    . sodium chloride    . sodium chloride    . heparin 1,100 Units/hr (04/10/20 2008)   PRN Meds: sodium chloride, sodium chloride, acetaminophen **OR** acetaminophen, ondansetron **OR** ondansetron (ZOFRAN) IV, sodium chloride flush, sodium chloride flush   Vital Signs    Vitals:   04/10/20 1600 04/10/20 2023 04/10/20 2028 04/11/20 0518  BP: (!) 160/67 138/67  (!) 149/77  Pulse: 69 66  76  Resp: 20 20  19   Temp: 97.6 F (36.4 C) 98.1 F (36.7 C)  97.9 F (36.6 C)  TempSrc: Oral Oral  Oral  SpO2: 94% 95% 95% 99%  Weight:    90.5 kg   No intake or output data in the 24 hours ending 04/11/20 0730 Filed Weights   04/09/20 0317 04/10/20 0443 04/11/20 0518  Weight: 90.7 kg 90.5 kg 90.5 kg    Telemetry    Atrial fibrillation with HR in the 80s-100s - Personally Reviewed  ECG    No new tracings - Personally Reviewed  Physical Exam   GEN:  Sitting in bedside chair watching a western on TV in no acute distress.   Neck: No JVD, no carotid bruits Cardiac: RRR, no murmurs, rubs, or gallops.  Respiratory: scattered expiratory wheeze GI: NABS, Soft, nontender, non-distended  MS: No edema; No deformity. Neuro:  Nonfocal, moving all extremities spontaneously Psych: Normal affect   Labs    Chemistry Recent Labs  Lab 04/09/20 0404 04/10/20 0338 04/11/20 0423  NA 142 140 139  K 4.8 4.4 4.8  CL 106 107 104  CO2 28 26 26   GLUCOSE 189* 137* 142*  BUN 36* 46* 37*  CREATININE 1.69* 1.64* 1.52*  CALCIUM 9.3 9.4 9.4  PROT 6.3*  --   --   ALBUMIN 3.3*  --   --   AST 46*  --   --   ALT 22  --   --   ALKPHOS 44  --   --   BILITOT 1.1  --   --   GFRNONAA 36* 38* 41*  GFRAA 42* 44* 48*  ANIONGAP 8 7 9      Hematology Recent Labs  Lab 04/09/20 0404 04/10/20 0338 04/11/20 0423  WBC 16.6* 10.8* 7.9  RBC 3.66* 3.74* 3.73*  HGB 11.4* 11.7* 11.6*  HCT 36.5* 36.9*  36.8*  MCV 99.7 98.7 98.7  MCH 31.1 31.3 31.1  MCHC 31.2 31.7 31.5  RDW 13.7 13.6 13.5  PLT 122* 111* 117*    Cardiac EnzymesNo results for input(s): TROPONINI in the last 168 hours. No results for input(s): TROPIPOC in the last 168 hours.   BNP Recent Labs  Lab 04/08/20 2315  BNP 2,748.2*     DDimer No results for input(s): DDIMER in the last 168 hours.   Radiology    ECHOCARDIOGRAM COMPLETE  Result Date: 04/09/2020    ECHOCARDIOGRAM REPORT   Patient Name:   ASHAN CUEVA Date of Exam: 04/09/2020 Medical Rec #:  [id number]      Height:       71.0 in Accession #:    [id number]     Weight:       200.0 lb Date of Birth:  Jul 18, 1932     BSA:          2.108 m Patient Age:    69 years       BP:           142/63 mmHg Patient Gender: M              HR:           69 bpm. Exam Location:  Inpatient Procedure: 2D Echo, Cardiac Doppler and Color Doppler Indications:    Elevated Troponin  History:        Patient has prior history of Echocardiogram examinations, most                  recent 01/11/2019. CHF, Previous Myocardial Infarction, Prior                 CABG, COPD and Stroke, Arrythmias:Atrial Fibrillation; Risk                 Factors:Hypertension, Diabetes and Dyslipidemia. Primary cancer                 of left lower lobe of lung,Tobacco abuse.  Sonographer:    Alvino Chapel RCS Referring Phys: [id number] [id number]  1. Left ventricular ejection fraction, by estimation, is 35 to 40%. The left ventricle has moderately decreased function. The left ventricle demonstrates global hypokinesis with regional wall motion abnormalities (see scoring diagram/findings for description). There is mild left ventricular hypertrophy. Left ventricular diastolic parameters are consistent with Grade III diastolic dysfunction (restrictive). There is the interventricular septum is flattened in systole and diastole, consistent with right ventricular pressure and volume overload.  2. Right ventricular systolic function is normal. The right ventricular size is normal. There is moderately elevated pulmonary artery systolic pressure. The estimated right ventricular systolic pressure is 40.3 mmHg.  3. Left atrial size was moderately dilated.  4. Right atrial size was mild to moderately dilated.  5. The mitral valve is abnormal. Mild mitral valve regurgitation.  6. The aortic valve is tricuspid. Aortic valve regurgitation is not visualized. Mild to moderate aortic valve sclerosis/calcification is present, without any evidence of aortic stenosis.  7. The inferior vena cava is dilated in size with <50% respiratory variability, suggesting right atrial pressure of 15 mmHg. FINDINGS  Left Ventricle: Left ventricular ejection fraction, by estimation, is 35 to 40%. The left ventricle has moderately decreased function. The left ventricle demonstrates regional wall motion abnormalities. The left ventricular internal cavity size was normal in size. There is mild left ventricular hypertrophy. The  interventricular septum is flattened in systole and diastole, consistent with right  ventricular pressure and volume overload. Left ventricular diastolic parameters are consistent with Grade III diastolic dysfunction (restrictive).  LV Wall Scoring: The basal inferior segment is akinetic. Right Ventricle: The right ventricular size is normal. No increase in right ventricular wall thickness. Right ventricular systolic function is normal. There is moderately elevated pulmonary artery systolic pressure. The tricuspid regurgitant velocity is 3.20 m/s, and with an assumed right atrial pressure of 15 mmHg, the estimated right ventricular systolic pressure is 69.4 mmHg. Left Atrium: Left atrial size was moderately dilated. Right Atrium: Right atrial size was mild to moderately dilated. Pericardium: There is no evidence of pericardial effusion. Mitral Valve: The mitral valve is abnormal. There is mild thickening of the mitral valve leaflet(s). There is mild calcification of the mitral valve leaflet(s). Moderate mitral annular calcification. Mild mitral valve regurgitation. Tricuspid Valve: The tricuspid valve is grossly normal. Tricuspid valve regurgitation is mild. Aortic Valve: The aortic valve is tricuspid. Aortic valve regurgitation is not visualized. Mild to moderate aortic valve sclerosis/calcification is present, without any evidence of aortic stenosis. Moderate aortic valve annular calcification. Aortic valve mean gradient measures 8.0 mmHg. Aortic valve peak gradient measures 14.5 mmHg. Aortic valve area, by VTI measures 1.03 cm. Pulmonic Valve: The pulmonic valve was grossly normal. Pulmonic valve regurgitation is trivial. Aorta: The aortic root is normal in size and structure. Venous: The inferior vena cava is dilated in size with less than 50% respiratory variability, suggesting right atrial pressure of 15 mmHg. IAS/Shunts: No atrial level shunt detected by color flow Doppler.  LEFT VENTRICLE PLAX 2D LVIDd:          5.00 cm      Diastology LVIDs:         4.30 cm      LV e' medial:   3.49 cm/s LV PW:         1.30 cm      LV E/e' medial: 45.8 LV IVS:        1.00 cm LVOT diam:     1.90 cm LV SV:         46 LV SV Index:   22 LVOT Area:     2.84 cm  LV Volumes (MOD) LV vol d, MOD A2C: 102.0 ml LV vol d, MOD A4C: 99.9 ml LV vol s, MOD A2C: 50.5 ml LV vol s, MOD A4C: 62.6 ml LV SV MOD A2C:     51.5 ml LV SV MOD A4C:     99.9 ml LV SV MOD BP:      46.1 ml RIGHT VENTRICLE TAPSE (M-mode): 0.9 cm LEFT ATRIUM             Index       RIGHT ATRIUM           Index LA diam:        5.00 cm 2.37 cm/m  RA Area:     24.20 cm LA Vol (A2C):   88.9 ml 42.17 ml/m RA Volume:   84.30 ml  39.98 ml/m LA Vol (A4C):   92.8 ml 44.02 ml/m LA Biplane Vol: 98.2 ml 46.58 ml/m  AORTIC VALVE AV Area (Vmax):    1.23 cm AV Area (Vmean):   1.12 cm AV Area (VTI):     1.03 cm AV Vmax:           190.50 cm/s AV Vmean:          132.500 cm/s AV VTI:  0.450 m AV Peak Grad:      14.5 mmHg AV Mean Grad:      8.0 mmHg LVOT Vmax:         82.50 cm/s LVOT Vmean:        52.200 cm/s LVOT VTI:          0.163 m LVOT/AV VTI ratio: 0.36  AORTA Ao Root diam: 3.30 cm MITRAL VALVE                TRICUSPID VALVE MV Area (PHT): 4.39 cm     TR Peak grad:   41.0 mmHg MV Decel Time: 173 msec     TR Vmax:        320.00 cm/s MV E velocity: 160.00 cm/s                             SHUNTS                             Systemic VTI:  0.16 m                             Systemic Diam: 1.90 cm Rozann Lesches MD Electronically signed by Rozann Lesches MD Signature Date/Time: 04/09/2020/1:33:14 PM    Final     Cardiac Studies   Echocardiogram 04/09/20: 1. Left ventricular ejection fraction, by estimation, is 35 to 40%. The  left ventricle has moderately decreased function. The left ventricle  demonstrates global hypokinesis with regional wall motion abnormalities  (see scoring diagram/findings for  description). There is mild left ventricular hypertrophy. Left ventricular   diastolic parameters are consistent with Grade III diastolic dysfunction  (restrictive). There is the interventricular septum is flattened in  systole and diastole, consistent with  right ventricular pressure and volume overload.  2. Right ventricular systolic function is normal. The right ventricular  size is normal. There is moderately elevated pulmonary artery systolic  pressure. The estimated right ventricular systolic pressure is 09.6 mmHg.  3. Left atrial size was moderately dilated.  4. Right atrial size was mild to moderately dilated.  5. The mitral valve is abnormal. Mild mitral valve regurgitation.  6. The aortic valve is tricuspid. Aortic valve regurgitation is not  visualized. Mild to moderate aortic valve sclerosis/calcification is  present, without any evidence of aortic stenosis.  7. The inferior vena cava is dilated in size with <50% respiratory  variability, suggesting right atrial pressure of 15 mmHg.   Patient Profile     86 y.o. male with PMH of CAD s/p CABG (LHC in 2014 with patent LIMA to LAD, occluded sequential radial graft to OM1-OM 2-PDA, occluded RCA with left-to-right collaterals), ICM, chronic combined systolic and diastolic heart failure, PAD s/p intervention to L external iliac in 03/2015, HTN, HLD, persistent atrial fibrillation on Eliquis, hypothyroidism, tobacco abuse and COPD, who presented with N/V and fall out of bed when rolling over to vomit, found to have elevated troponins, for which cardiology is following.   Assessment & Plan    1. NSTEMI: patient presented after a fall out of bed and N/V. Found to have elevated trops - peaked at 8033 and trended down. Decision made to pursue a LHC after eliquis wash out - planned for later today - Keep NPO for heart cath later today.  - Continue aspirin and zetia - Continue carvedilol   2.  Ischemic cardiomyopathy/ chronic diastolic CHF: Echo this admission with EF 35-40%, improved from 30-35% 12/2018. He  appears euvolemic on exam - Anticipate restarting hydralazine and entresto following cath - Continue carvedilol and lasix - Continue to monitor strict I&Os and daily weights  3. Persistent atrial fibrillation: rates fairly well controlled. No complaints of bleeding with eliquis. Eliquis held yesterday in anticipation of LHC - Anticipate resuming eliquis for stroke ppx following LHC today - Continue carvedilol for rate control  4. COPD exacerbation: continues to have some mild wheezing on exam today.  - Continue management per primary team  5. CKD stage 3: Cr stable at 1.52 today. Receiving gentle fluids in anticipation of LHC today.  - Continue to monitor closely  For questions or updates, please contact [place] Please consult [email or web address] for contact info under Cardiology/STEMI.      Signed, Abigail Butts, PA-C  04/11/2020, 7:30 AM   [phone]  Personally seen and examined. Agree with above.   86 year old with non-STEMI  Non-STEMI -Plan for heart catheterization today.  Previously went over risk and benefits including stroke heart attack death renal impairment bleeding.  Willing to proceed.  Discussed with wife as well.  Today is their 64th wedding anniversary. -His symptoms consisted of nausea, vomiting.  He never really described any typical chest discomfort.  His troponin however was 8000.  I think it behooves [place] to take another look at his bypass anatomy, make sure LIMA to LAD is patent.  Previously radial jump graft was occluded.  Per cath report in 11/5 2014: LIMA to LAD is patent Free Radial (? Y graft from LIMA) to OM1, OM2, PDA is occluded  Coronary artery disease -CABG 2003  Chronic kidney disease stage III -Hydrating precatheterization now.  75 cc an hour just started.  With his cardiomyopathy, we have been gentle.  Lasix on hold.  No ACE inhibitor currently.  Hyperlipidemia -Currently on Zetia 10 mg a day.  Ultimately would recommend statin therapy  however he may not have tolerated in the past.  COPD, secondary pulmonary hypertension -Previously counseled on cigar cessation. -Mucus production noted.  DuoNebs seems to help.  Left lower lobe malignant lung nodule in 2016 -Radiation therapy.  Candee Furbish, MD

## 2020-04-11 NOTE — Progress Notes (Signed)
Occupational Therapy Treatment Patient Details Name: Gary Odonnell MRN: [id number] DOB: 01-04-1932 Today's Date: 04/11/2020    History of present illness 86 y.o. male with medical history significant of  HTN, COPD, CAD/PVD s/p CABG 2003,  HFrEF EF 35%,  Afib, lung cancer s/p radiation, PAD, HLD, CVA. [facility] showed RLL infiltrate, A. Fib with T-wave inversions. BNP elevated and troponins increasing. Transfered to [facility] for treatment 04/08/20.   OT comments  Pt making steady progress towards OT goals this session. t continues to present with decreased activity tolerance and generalized weakness impacting pts ability to engage in ADLs. Session focus on functional mobility as precursor to higher level ADLs. Overall, pt required min guard for household distance functional mobility with RW. Pt on RA throughout session with O2 >93%. Pt reports feeling SOB post ambulation needing seated rest break however O2 WNL. Continued education on ECS related to mobility and ADLs. DC plan currently remains appropriate, will follow acutely per POC.    Follow Up Recommendations  Home health OT    Equipment Recommendations  None recommended by OT    Recommendations for Other Services      Precautions / Restrictions Precautions Precautions: Fall Precaution Comments: came in as a result of a fall Restrictions Weight Bearing Restrictions: No       Mobility Bed Mobility Overal bed mobility: Needs Assistance Bed Mobility: Supine to Sit     Supine to sit: Supervision;HOB elevated     General bed mobility comments: supervision for safety with use of bed rails and elevated HOB  Transfers Overall transfer level: Needs assistance Equipment used: Rolling walker (2 wheeled);None Transfers: Sit to/from Stand Sit to Stand: Min guard;Min assist         General transfer comment: pt sit<>stand from EOB with no AD impulsively reaching for OTAs hand upon initital stand. Pt  complete additional sit<>stand from EOB with RW with min guard assist for safety    Balance Overall balance assessment: Needs assistance Sitting-balance support: Feet supported;No upper extremity supported Sitting balance-Leahy Scale: Good     Standing balance support: During functional activity;Single extremity supported Standing balance-Leahy Scale: Poor Standing balance comment: noted to need at least one UE supported for dynamic tasks                           ADL either performed or assessed with clinical judgement   ADL Overall ADL's : Needs assistance/impaired                 Upper Body Dressing : Minimal assistance;Standing Upper Body Dressing Details (indicate cue type and reason): to don hospital gown as back side cover     Toilet Transfer: Min Marine scientist Details (indicate cue type and reason): simulated via functional mobility with RW; pt slightly impulsive noted to sit<>stand from EOB with no AD reaching for rolling table for support       Tub/Shower Transfer Details (indicate cue type and reason): pt reports walkin shower with seat at home Functional mobility during ADLs: Min guard;Rolling walker;Cueing for safety General ADL Comments: pt continues to present with decreased activity tolerance and generalized weakness impacting pts ability to engage in ADLs. Pt complete household distance functional mobility with RW and min guard assis ton RA. pt reports dyspnea from exertion however O2 WFL. pt reports concern with ambulating up ramp at home; problem solved through using w/c if needed     Vision  Baseline Vision/History: No visual deficits Patient Visual Report: No change from baseline     Perception     Praxis      Cognition Arousal/Alertness: Awake/alert Behavior During Therapy: WFL for tasks assessed/performed Overall Cognitive Status: Within Functional Limits for tasks assessed                                           Exercises     Shoulder Instructions       General Comments pt on 1L o2 with O2 WNL; doffed O2 during mobility with O2 >93% during mobility. pt reports feeling SOB post mobility with RR increase to as much as 24. Left pt on RA with RN aware    Pertinent Vitals/ Pain       Pain Assessment: Faces Faces Pain Scale: No hurt  Home Living                                          Prior Functioning/Environment              Frequency  Min 2X/week        Progress Toward Goals  OT Goals(current goals can now be found in the care plan section)  Progress towards OT goals: Progressing toward goals  Acute Rehab OT Goals Patient Stated Goal: to be able to mow the lawn OT Goal Formulation: With patient Time For Goal Achievement: 04/23/20 Potential to Achieve Goals: Good  Plan Discharge plan remains appropriate    Co-evaluation                 AM-PAC OT "6 Clicks" Daily Activity     Outcome Measure   Help from another person eating meals?: None Help from another person taking care of personal grooming?: A Little Help from another person toileting, which includes using toliet, bedpan, or urinal?: A Little Help from another person bathing (including washing, rinsing, drying)?: A Little Help from another person to put on and taking off regular upper body clothing?: None Help from another person to put on and taking off regular lower body clothing?: A Little 6 Click Score: 20    End of Session Equipment Utilized During Treatment: Gait belt;Rolling walker  OT Visit Diagnosis: Unsteadiness on feet (R26.81);Other abnormalities of gait and mobility (R26.89);Muscle weakness (generalized) (M62.81)   Activity Tolerance Patient tolerated treatment well   Patient Left in chair;with call bell/phone within reach;with chair alarm set   Nurse Communication Mobility status;Other (comment)(left on RA)        Time: 6063-0160 OT Time  Calculation (min): 24 min  Charges: OT General Charges $OT Visit: 1 Visit OT Treatments $Therapeutic Activity: 23-37 mins  Lanier Clam., COTA/L Acute Rehabilitation Services [phone] [place] 04/11/2020, 8:51 AM

## 2020-04-11 NOTE — Discharge Summary (Signed)
Physician Discharge Summary  NORMAN PIACENTINI TFT:[id number] DOB: 06/21/1932 DOA: 04/08/2020  PCP: Glenda Chroman, MD  Admit date: 04/08/2020 Discharge date: 04/11/2020  Admitted From: home  Disposition:  home   Recommendations for Outpatient Follow-up:  1. Continue to recommend smoking cessation 2. F/u TSH in a few wks  Home Health:  ordered  Discharge Condition:  stable   CODE STATUS:  DNR   Diet recommendation: hearth healthy Consultations:  Cardiology   Procedures/Studies:  Cardiac cath  1. Severe triple vessel CAD s/p 4V CABG with 1/4 patent grafts.  2. Occluded proximal LAD. Patent LIMA to LAD 3. Moderately severe proximal Circumflex stenosis, unchanged from last cath. Severe distal Circumflex disease. Too small for PCI.  4. Moderately severe intermediate disease, grossly unchanged since last cath  5. Chronically occluded proximal RCA. The distal vessel fills from left to right collaterals.  6. Normal filling pressures  2 D ECHO 1. Left ventricular ejection fraction, by estimation, is 35 to 40%. The  left ventricle has moderately decreased function. The left ventricle  demonstrates global hypokinesis with regional wall motion abnormalities  (see scoring diagram/findings for  description). There is mild left ventricular hypertrophy. Left ventricular  diastolic parameters are consistent with Grade III diastolic dysfunction  (restrictive). There is the interventricular septum is flattened in  systole and diastole, consistent with  right ventricular pressure and volume overload.  2. Right ventricular systolic function is normal. The right ventricular  size is normal. There is moderately elevated pulmonary artery systolic  pressure. The estimated right ventricular systolic pressure is 70.6 mmHg.  3. Left atrial size was moderately dilated.  4. Right atrial size was mild to moderately dilated.  5. The mitral valve is abnormal. Mild mitral valve regurgitation.  6. The  aortic valve is tricuspid. Aortic valve regurgitation is not  visualized. Mild to moderate aortic valve sclerosis/calcification is  present, without any evidence of aortic stenosis.  7. The inferior vena cava is dilated in size with <50% respiratory  variability, suggesting right atrial pressure of 15 mmHg.   Discharge Diagnoses:  Principal Problem:   NSTEMI (non-ST elevated myocardial infarction) ([place]) Active Problems:   Acute on chronic systolic and diastolic heart failure (HCC)   HTN (hypertension)   COPD with chronic bronchitis   CAD (coronary atherosclerotic disease)   Primary cancer of left lower lobe of lung ([place])   A-fib ([place])   Diet-controlled diabetes mellitus ([place])   Hx of completed stroke   Pressure injury of skin     Brief Summary: Aquarius Latouche Hudon a 86 y.o.malewith medical history significant of HTN, COPD, CAD/PVD s/p CABG 2003, HFrEF EF 35%, Afib, lung cancer s/p radiation, PAD, HLD, CVA.  He fell out of bed on 5/15 and laid on the floor because he could not get up on his own. He had a significant amount of vomiting as well.   In the ED > pulse ox in 60s, Troponin ~ 5 CXR- vascular congestion with interstitial thickening and a more subtle hazy airspace opacities, most evident on the right CT abdomen pelvis without contrast > small pleural effusion with mild dependent atelectasis, mild interstitial prominence and borderline cardiomegaly concerning for mild CHF, chronic diverticula without evidence of diverticulitis, significant vascular disease, asymmetry right renal cyst, T12 chronic compression fracture.   Hospital Course:  Principal Problem:   NSTEMI (non-ST elevated myocardial infarction) CAD s/p CABG -Troponin peaked ~ 8000 and now trending down- no chest pain but did have nausea and vomiting -Cardiology has  decided to do a cardiac cath-the patient received his Eliquis this morning and therefore it will need to be held today-heparin will be used once  Eliquis has washed out -s/p cath today> has extensive CAD- see report above - medical management recommended- on ASA, Eliquis, Coreg and Zetia which I will continue   Active Problems: Acute hypoxic respiratory failure in ED (A) Acute Chronic systolic and diastolic heart failure with essential HTN - grade 3 d CHF and EF of 35-40% per ECHO - see below - given IV Lasix in ED on 5/15 - about 100% in room air on 5/16 -  receiving Lasix, Hydralazine, Coreg & Entresto at home     (B) Acute exacerbation of COPD with elevated RV pressure on ECHO > Cor pulmonale in a cigar smoker - not on home O2  - he returned the oxygen many months ago -  does not have a pulmonologist - continues to smoke  -I have counseled him to stop -He has a chronic cough with sputum production and may have chronic bronchitis-although he had a right lower lobe infiltrate in the ED, I do not feel he has pneumonia  - noted rhonchi almost daily on exam and he admits to coughing up mucus daily  - have added Mucinex and Breo ellipta which should be continued for home use- this appears to have helped with his congestion  - Will also prescribe Albuterol inhaler PRN  Pulse ox > 90% on ambulation today    A-fib, persistent  -On apixaban and Coreg  LLL malignant lung nodule- 2016 - s/p SBRT    Diet-controlled diabetes mellitus  - A1c 6.6 in 1/21 and 7.0 on 5/16 -cont SSI  OSA - non compliant with CPAP  CKD 3b - Cr 1.69  Slightly elevated TSH  -High possibility of having sick euthyroid syndrome  -TSH should be rechecked by his PCP as outpatient  Pyuria - Is asymptomatic and therefore we will hold off on treating this     Discharge Exam: Vitals:   04/11/20 1436 04/11/20 1451  BP: (!) 159/71 (!) 147/79  Pulse: 68 66  Resp: 19 (!) 24  Temp:    SpO2: 93% 93%   Vitals:   04/11/20 1410 04/11/20 1423 04/11/20 1436 04/11/20 1451  BP: (!) 154/68 (!) 141/67 (!) 159/71 (!) 147/79  Pulse: 64 70 68 66  Resp:  (!) 24 15 19  (!) 24  Temp:  98 F (36.7 C)    TempSrc:  Oral    SpO2: 98% 94% 93% 93%  Weight:      Height:        General: Pt is alert, awake, not in acute distress Cardiovascular: RRR, S1/S2 +, no rubs, no gallops Respiratory: faint rhonchi, no wheezing  Abdominal: Soft, NT, ND, bowel sounds + Extremities: no edema, no cyanosis   Discharge Instructions  Discharge Instructions    Diet - low sodium heart healthy   Complete by: As directed    Increase activity slowly   Complete by: As directed      Allergies as of 04/11/2020      Reactions   Statins Other (See Comments)   Intolerance per outpt Cardiology notes      Medication List    STOP taking these medications   potassium chloride SA 20 MEQ tablet Commonly known as: KLOR-CON     TAKE these medications   albuterol 108 (90 Base) MCG/ACT inhaler Commonly known as: VENTOLIN HFA Inhale 2 puffs into the lungs every 6 (  six) hours as needed for wheezing or shortness of breath.   apixaban 5 MG Tabs tablet Commonly known as: Eliquis Take 1 tablet (5 mg total) by mouth 2 (two) times daily.   aspirin 81 MG EC tablet Take 1 tablet (81 mg total) by mouth daily. Start taking on: Apr 12, 2020   carvedilol 6.25 MG tablet Commonly known as: COREG Take 1 tablet (6.25 mg total) by mouth 2 (two) times daily with a meal.   EYE DROPS OP Apply 1 drop to eye daily as needed (dry eyes).   ezetimibe 10 MG tablet Commonly known as: ZETIA Take 1 tablet (10 mg total) by mouth daily.   fluticasone furoate-vilanterol 100-25 MCG/INH Aepb Commonly known as: BREO ELLIPTA Inhale 1 puff into the lungs daily. Start taking on: Apr 12, 2020   furosemide 40 MG tablet Commonly known as: LASIX Take one tab (40mg ) by mouth every other day alternating with 1 1/2 tabs (60mg ) every other day. What changed:   how much to take  how to take this  when to take this   guaiFENesin 600 MG 12 hr tablet Commonly known as: MUCINEX Take 1  tablet (600 mg total) by mouth 2 (two) times daily.   hydrALAZINE 25 MG tablet Commonly known as: APRESOLINE Take 37.5 mg by mouth every 8 (eight) hours.   nitroGLYCERIN 0.3 MG SL tablet Commonly known as: Nitrostat Place 1 tablet (0.3 mg total) under the tongue every 5 (five) minutes as needed for chest pain.   Potassium 99 MG Tabs Take 1 tablet by mouth daily.   sacubitril-valsartan 49-51 MG Commonly known as: ENTRESTO Take 1 tablet by mouth 2 (two) times daily.       Allergies  Allergen Reactions  . Statins Other (See Comments)    Intolerance per outpt Cardiology notes      CARDIAC CATHETERIZATION  Result Date: 04/11/2020  Prox RCA lesion is 100% stenosed.  Prox LAD lesion is 100% stenosed.  LIMA graft was visualized by angiography and is normal in caliber.  Dist Cx lesion is 90% stenosed.  Ost Cx to Prox Cx lesion is 70% stenosed.  Lat Ramus lesion is 70% stenosed.  Ramus lesion is 70% stenosed.  1. Severe triple vessel CAD s/p 4V CABG with 1/4 patent grafts. 2. Occluded proximal LAD. Patent LIMA to LAD 3. Moderately severe proximal Circumflex stenosis, unchanged from last cath. Severe distal Circumflex disease. Too small for PCI. 4. Moderately severe intermediate disease, grossly unchanged since last cath 5. Chronically occluded proximal RCA. The distal vessel fills from left to right collaterals. 6. Normal filling pressures Recommendations: He has moderately severe disease in the intermediate branch and the Circumflex. His LAD is chronically occluded but the LIMA graft to the mid LAD is patent. The RCA is chronically occluded and fills from left to right collaterals. There has been progression of his disease in the intermediate and Circumflex, however, in the absence of angina, I would continue medical management of his CAD at this time.   ECHOCARDIOGRAM COMPLETE  Result Date: 04/09/2020    ECHOCARDIOGRAM REPORT   Patient Name:   DAKHARI ZUVER Date of Exam: 04/09/2020  Medical Rec #:  [id number]      Height:       71.0 in Accession #:    [id number]     Weight:       200.0 lb Date of Birth:  [birth year]     BSA:          2.108  m Patient Age:    39 years       BP:           142/63 mmHg Patient Gender: M              HR:           69 bpm. Exam Location:  Inpatient Procedure: 2D Echo, Cardiac Doppler and Color Doppler Indications:    Elevated Troponin  History:        Patient has prior history of Echocardiogram examinations, most                 recent 01/11/2019. CHF, Previous Myocardial Infarction, Prior                 CABG, COPD and Stroke, Arrythmias:Atrial Fibrillation; Risk                 Factors:Hypertension, Diabetes and Dyslipidemia. Primary cancer                 of left lower lobe of lung,Tobacco abuse.  Sonographer:    Alvino Chapel RCS Referring Phys: [id number] Gene Autry  1. Left ventricular ejection fraction, by estimation, is 35 to 40%. The left ventricle has moderately decreased function. The left ventricle demonstrates global hypokinesis with regional wall motion abnormalities (see scoring diagram/findings for description). There is mild left ventricular hypertrophy. Left ventricular diastolic parameters are consistent with Grade III diastolic dysfunction (restrictive). There is the interventricular septum is flattened in systole and diastole, consistent with right ventricular pressure and volume overload.  2. Right ventricular systolic function is normal. The right ventricular size is normal. There is moderately elevated pulmonary artery systolic pressure. The estimated right ventricular systolic pressure is 10.6 mmHg.  3. Left atrial size was moderately dilated.  4. Right atrial size was mild to moderately dilated.  5. The mitral valve is abnormal. Mild mitral valve regurgitation.  6. The aortic valve is tricuspid. Aortic valve regurgitation is not visualized. Mild to moderate aortic valve sclerosis/calcification is present, without any evidence of  aortic stenosis.  7. The inferior vena cava is dilated in size with <50% respiratory variability, suggesting right atrial pressure of 15 mmHg. FINDINGS  Left Ventricle: Left ventricular ejection fraction, by estimation, is 35 to 40%. The left ventricle has moderately decreased function. The left ventricle demonstrates regional wall motion abnormalities. The left ventricular internal cavity size was normal in size. There is mild left ventricular hypertrophy. The interventricular septum is flattened in systole and diastole, consistent with right ventricular pressure and volume overload. Left ventricular diastolic parameters are consistent with Grade III diastolic dysfunction (restrictive).  LV Wall Scoring: The basal inferior segment is akinetic. Right Ventricle: The right ventricular size is normal. No increase in right ventricular wall thickness. Right ventricular systolic function is normal. There is moderately elevated pulmonary artery systolic pressure. The tricuspid regurgitant velocity is 3.20 m/s, and with an assumed right atrial pressure of 15 mmHg, the estimated right ventricular systolic pressure is 26.9 mmHg. Left Atrium: Left atrial size was moderately dilated. Right Atrium: Right atrial size was mild to moderately dilated. Pericardium: There is no evidence of pericardial effusion. Mitral Valve: The mitral valve is abnormal. There is mild thickening of the mitral valve leaflet(s). There is mild calcification of the mitral valve leaflet(s). Moderate mitral annular calcification. Mild mitral valve regurgitation. Tricuspid Valve: The tricuspid valve is grossly normal. Tricuspid valve regurgitation is mild. Aortic Valve: The aortic valve is tricuspid. Aortic valve regurgitation is not  visualized. Mild to moderate aortic valve sclerosis/calcification is present, without any evidence of aortic stenosis. Moderate aortic valve annular calcification. Aortic valve mean gradient measures 8.0 mmHg. Aortic valve peak  gradient measures 14.5 mmHg. Aortic valve area, by VTI measures 1.03 cm. Pulmonic Valve: The pulmonic valve was grossly normal. Pulmonic valve regurgitation is trivial. Aorta: The aortic root is normal in size and structure. Venous: The inferior vena cava is dilated in size with less than 50% respiratory variability, suggesting right atrial pressure of 15 mmHg. IAS/Shunts: No atrial level shunt detected by color flow Doppler.  LEFT VENTRICLE PLAX 2D LVIDd:         5.00 cm      Diastology LVIDs:         4.30 cm      LV e' medial:   3.49 cm/s LV PW:         1.30 cm      LV E/e' medial: 45.8 LV IVS:        1.00 cm LVOT diam:     1.90 cm LV SV:         46 LV SV Index:   22 LVOT Area:     2.84 cm  LV Volumes (MOD) LV vol d, MOD A2C: 102.0 ml LV vol d, MOD A4C: 99.9 ml LV vol s, MOD A2C: 50.5 ml LV vol s, MOD A4C: 62.6 ml LV SV MOD A2C:     51.5 ml LV SV MOD A4C:     99.9 ml LV SV MOD BP:      46.1 ml RIGHT VENTRICLE TAPSE (M-mode): 0.9 cm LEFT ATRIUM             Index       RIGHT ATRIUM           Index LA diam:        5.00 cm 2.37 cm/m  RA Area:     24.20 cm LA Vol (A2C):   88.9 ml 42.17 ml/m RA Volume:   84.30 ml  39.98 ml/m LA Vol (A4C):   92.8 ml 44.02 ml/m LA Biplane Vol: 98.2 ml 46.58 ml/m  AORTIC VALVE AV Area (Vmax):    1.23 cm AV Area (Vmean):   1.12 cm AV Area (VTI):     1.03 cm AV Vmax:           190.50 cm/s AV Vmean:          132.500 cm/s AV VTI:            0.450 m AV Peak Grad:      14.5 mmHg AV Mean Grad:      8.0 mmHg LVOT Vmax:         82.50 cm/s LVOT Vmean:        52.200 cm/s LVOT VTI:          0.163 m LVOT/AV VTI ratio: 0.36  AORTA Ao Root diam: 3.30 cm MITRAL VALVE                TRICUSPID VALVE MV Area (PHT): 4.39 cm     TR Peak grad:   41.0 mmHg MV Decel Time: 173 msec     TR Vmax:        320.00 cm/s MV E velocity: 160.00 cm/s                             SHUNTS  Systemic VTI:  0.16 m                             Systemic Diam: 1.90 cm Rozann Lesches MD  Electronically signed by Rozann Lesches MD Signature Date/Time: 04/09/2020/1:33:14 PM    Final      The results of significant diagnostics from this hospitalization (including imaging, microbiology, ancillary and laboratory) are listed below for reference.     Microbiology: Recent Results (from the past 240 hour(s))  Culture, Urine     Status: Abnormal   Collection Time: 04/08/20 10:23 PM   Specimen: Urine, Random  Result Value Ref Range Status   Specimen Description URINE, RANDOM  Final   Special Requests   Final    Normal Performed at [facility], [address] [place], [place] [address]    Culture 10,000 COLONIES/mL ESCHERICHIA COLI (A)  Final   Report Status 04/11/2020 FINAL  Final   Organism ID, Bacteria ESCHERICHIA COLI (A)  Final      Susceptibility   Escherichia coli - MIC*    AMPICILLIN >=32 RESISTANT Resistant     CEFAZOLIN <=4 SENSITIVE Sensitive     CEFTRIAXONE <=1 SENSITIVE Sensitive     CIPROFLOXACIN <=0.25 SENSITIVE Sensitive     GENTAMICIN <=1 SENSITIVE Sensitive     IMIPENEM <=0.25 SENSITIVE Sensitive     NITROFURANTOIN <=16 SENSITIVE Sensitive     TRIMETH/SULFA >=320 RESISTANT Resistant     AMPICILLIN/SULBACTAM 4 SENSITIVE Sensitive     PIP/TAZO <=4 SENSITIVE Sensitive     * 10,000 COLONIES/mL ESCHERICHIA COLI  MRSA PCR Screening     Status: None   Collection Time: 04/09/20  7:32 AM   Specimen: Nasal Mucosa; Nasopharyngeal  Result Value Ref Range Status   MRSA by PCR NEGATIVE NEGATIVE Final    Comment:        The GeneXpert MRSA Assay (FDA approved for NASAL specimens only), is one component of a comprehensive MRSA colonization surveillance program. It is not intended to diagnose MRSA infection nor to guide or monitor treatment for MRSA infections. Performed at [facility], [place] [address], [place], [place] [address]      Labs: BNP (last 3 results) Recent Labs    04/08/20 2315  BNP 7,858.8*   Basic Metabolic Panel: Recent  Labs  Lab 04/09/20 0404 04/10/20 0338 04/11/20 0423  NA 142 140 139  K 4.8 4.4 4.8  CL 106 107 104  CO2 28 26 26   GLUCOSE 189* 137* 142*  BUN 36* 46* 37*  CREATININE 1.69* 1.64* 1.52*  CALCIUM 9.3 9.4 9.4  MG 2.0  --   --   PHOS 3.3  --   --    Liver Function Tests: Recent Labs  Lab 04/09/20 0404  AST 46*  ALT 22  ALKPHOS 44  BILITOT 1.1  PROT 6.3*  ALBUMIN 3.3*   No results for input(s): LIPASE, AMYLASE in the last 168 hours. No results for input(s): AMMONIA in the last 168 hours. CBC: Recent Labs  Lab 04/09/20 0404 04/10/20 0338 04/11/20 0423  WBC 16.6* 10.8* 7.9  NEUTROABS 15.2*  --   --   HGB 11.4* 11.7* 11.6*  HCT 36.5* 36.9* 36.8*  MCV 99.7 98.7 98.7  PLT 122* 111* 117*   Cardiac Enzymes: Recent Labs  Lab 04/08/20 2315  CKTOTAL 351   BNP: Invalid input(s): POCBNP CBG: Recent Labs  Lab 04/10/20 1134 04/10/20 1558 04/10/20 2118 04/11/20  1062 04/11/20 1118  GLUCAP 150* 127* 120* 129* 116*   D-Dimer No results for input(s): DDIMER in the last 72 hours. Hgb A1c Recent Labs    04/08/20 2315  HGBA1C 7.0*   Lipid Profile No results for input(s): CHOL, HDL, LDLCALC, TRIG, CHOLHDL, LDLDIRECT in the last 72 hours. Thyroid function studies Recent Labs    04/09/20 0404  TSH 5.414*   Anemia work up No results for input(s): VITAMINB12, FOLATE, FERRITIN, TIBC, IRON, RETICCTPCT in the last 72 hours. Urinalysis    Component Value Date/Time   COLORURINE AMBER (A) 04/08/2020 2223   APPEARANCEUR CLOUDY (A) 04/08/2020 2223   LABSPEC 1.018 04/08/2020 2223   PHURINE 5.0 04/08/2020 2223   GLUCOSEU 50 (A) 04/08/2020 2223   HGBUR LARGE (A) 04/08/2020 2223   BILIRUBINUR NEGATIVE 04/08/2020 2223   KETONESUR NEGATIVE 04/08/2020 2223   PROTEINUR 100 (A) 04/08/2020 2223   NITRITE NEGATIVE 04/08/2020 2223   LEUKOCYTESUR MODERATE (A) 04/08/2020 2223   Sepsis Labs Invalid input(s): PROCALCITONIN,  WBC,  LACTICIDVEN Microbiology Recent Results (from  the past 240 hour(s))  Culture, Urine     Status: Abnormal   Collection Time: 04/08/20 10:23 PM   Specimen: Urine, Random  Result Value Ref Range Status   Specimen Description URINE, RANDOM  Final   Special Requests   Final    Normal Performed at [facility], [address] [place], [place] [address]    Culture 10,000 COLONIES/mL ESCHERICHIA COLI (A)  Final   Report Status 04/11/2020 FINAL  Final   Organism ID, Bacteria ESCHERICHIA COLI (A)  Final      Susceptibility   Escherichia coli - MIC*    AMPICILLIN >=32 RESISTANT Resistant     CEFAZOLIN <=4 SENSITIVE Sensitive     CEFTRIAXONE <=1 SENSITIVE Sensitive     CIPROFLOXACIN <=0.25 SENSITIVE Sensitive     GENTAMICIN <=1 SENSITIVE Sensitive     IMIPENEM <=0.25 SENSITIVE Sensitive     NITROFURANTOIN <=16 SENSITIVE Sensitive     TRIMETH/SULFA >=320 RESISTANT Resistant     AMPICILLIN/SULBACTAM 4 SENSITIVE Sensitive     PIP/TAZO <=4 SENSITIVE Sensitive     * 10,000 COLONIES/mL ESCHERICHIA COLI  MRSA PCR Screening     Status: None   Collection Time: 04/09/20  7:32 AM   Specimen: Nasal Mucosa; Nasopharyngeal  Result Value Ref Range Status   MRSA by PCR NEGATIVE NEGATIVE Final    Comment:        The GeneXpert MRSA Assay (FDA approved for NASAL specimens only), is one component of a comprehensive MRSA colonization surveillance program. It is not intended to diagnose MRSA infection nor to guide or monitor treatment for MRSA infections. Performed at [facility], [place] [address], [place], [place] [address]      Time coordinating discharge in minutes: 65  SIGNED:   Debbe Odea, MD  [facility] 04/11/2020, 4:00 PM

## 2020-04-11 NOTE — Discharge Instructions (Signed)

## 2020-04-11 NOTE — H&P (View-Only) (Signed)
Progress Note  Patient Name: Gary Odonnell Date of Encounter: 04/11/2020  Primary Cardiologist: Carlyle Dolly, MD   Subjective   Patient reported some SOB this morning which improved after going for a walk. No complaints of chest pain, LE edema, or palpitations. He is anticipating his heart catheterization today.   Inpatient Medications    Scheduled Meds: . aspirin  81 mg Oral Pre-Cath  . [START ON 04/12/2020] aspirin EC  81 mg Oral Daily  . carvedilol  6.25 mg Oral BID WC  . docusate sodium  100 mg Oral BID  . ezetimibe  10 mg Oral Daily  . fluticasone furoate-vilanterol  1 puff Inhalation Daily  . furosemide  40 mg Oral QODAY  . furosemide  60 mg Oral QODAY  . guaiFENesin  600 mg Oral BID  . insulin aspart  0-5 Units Subcutaneous QHS  . insulin aspart  0-9 Units Subcutaneous TID WC  . ipratropium-albuterol  3 mL Nebulization TID  . sodium chloride flush  3 mL Intravenous Q12H  . sodium chloride flush  3 mL Intravenous Q12H  . sodium chloride flush  3 mL Intravenous Q12H   Continuous Infusions: . sodium chloride    . sodium chloride    . sodium chloride    . heparin 1,100 Units/hr (04/10/20 2008)   PRN Meds: sodium chloride, sodium chloride, acetaminophen **OR** acetaminophen, ondansetron **OR** ondansetron (ZOFRAN) IV, sodium chloride flush, sodium chloride flush   Vital Signs    Vitals:   04/10/20 1600 04/10/20 2023 04/10/20 2028 04/11/20 0518  BP: (!) 160/67 138/67  (!) 149/77  Pulse: 69 66  76  Resp: 20 20  19   Temp: 97.6 F (36.4 C) 98.1 F (36.7 C)  97.9 F (36.6 C)  TempSrc: Oral Oral  Oral  SpO2: 94% 95% 95% 99%  Weight:    90.5 kg   No intake or output data in the 24 hours ending 04/11/20 0730 Filed Weights   04/09/20 0317 04/10/20 0443 04/11/20 0518  Weight: 90.7 kg 90.5 kg 90.5 kg    Telemetry    Atrial fibrillation with HR in the 80s-100s - Personally Reviewed  ECG    No new tracings - Personally Reviewed  Physical Exam   GEN:  Sitting in bedside chair watching a western on TV in no acute distress.   Neck: No JVD, no carotid bruits Cardiac: RRR, no murmurs, rubs, or gallops.  Respiratory: scattered expiratory wheeze GI: NABS, Soft, nontender, non-distended  MS: No edema; No deformity. Neuro:  Nonfocal, moving all extremities spontaneously Psych: Normal affect   Labs    Chemistry Recent Labs  Lab 04/09/20 0404 04/10/20 0338 04/11/20 0423  NA 142 140 139  K 4.8 4.4 4.8  CL 106 107 104  CO2 28 26 26   GLUCOSE 189* 137* 142*  BUN 36* 46* 37*  CREATININE 1.69* 1.64* 1.52*  CALCIUM 9.3 9.4 9.4  PROT 6.3*  --   --   ALBUMIN 3.3*  --   --   AST 46*  --   --   ALT 22  --   --   ALKPHOS 44  --   --   BILITOT 1.1  --   --   GFRNONAA 36* 38* 41*  GFRAA 42* 44* 48*  ANIONGAP 8 7 9      Hematology Recent Labs  Lab 04/09/20 0404 04/10/20 0338 04/11/20 0423  WBC 16.6* 10.8* 7.9  RBC 3.66* 3.74* 3.73*  HGB 11.4* 11.7* 11.6*  HCT 36.5* 36.9*  36.8*  MCV 99.7 98.7 98.7  MCH 31.1 31.3 31.1  MCHC 31.2 31.7 31.5  RDW 13.7 13.6 13.5  PLT 122* 111* 117*    Cardiac EnzymesNo results for input(s): TROPONINI in the last 168 hours. No results for input(s): TROPIPOC in the last 168 hours.   BNP Recent Labs  Lab 04/08/20 2315  BNP 2,748.2*     DDimer No results for input(s): DDIMER in the last 168 hours.   Radiology    ECHOCARDIOGRAM COMPLETE  Result Date: 04/09/2020    ECHOCARDIOGRAM REPORT   Patient Name:   Gary Odonnell Date of Exam: 04/09/2020 Medical Rec #:  [id number]      Height:       71.0 in Accession #:    [id number]     Weight:       200.0 lb Date of Birth:  [birth year]     BSA:          2.108 m Patient Age:    86 years       BP:           142/63 mmHg Patient Gender: M              HR:           69 bpm. Exam Location:  Inpatient Procedure: 2D Echo, Cardiac Doppler and Color Doppler Indications:    Elevated Troponin  History:        Patient has prior history of Echocardiogram examinations, most                  recent 01/11/2019. CHF, Previous Myocardial Infarction, Prior                 CABG, COPD and Stroke, Arrythmias:Atrial Fibrillation; Risk                 Factors:Hypertension, Diabetes and Dyslipidemia. Primary cancer                 of left lower lobe of lung,Tobacco abuse.  Sonographer:    Alvino Chapel RCS Referring Phys: [id number] [place]  1. Left ventricular ejection fraction, by estimation, is 35 to 40%. The left ventricle has moderately decreased function. The left ventricle demonstrates global hypokinesis with regional wall motion abnormalities (see scoring diagram/findings for description). There is mild left ventricular hypertrophy. Left ventricular diastolic parameters are consistent with Grade III diastolic dysfunction (restrictive). There is the interventricular septum is flattened in systole and diastole, consistent with right ventricular pressure and volume overload.  2. Right ventricular systolic function is normal. The right ventricular size is normal. There is moderately elevated pulmonary artery systolic pressure. The estimated right ventricular systolic pressure is 62.1 mmHg.  3. Left atrial size was moderately dilated.  4. Right atrial size was mild to moderately dilated.  5. The mitral valve is abnormal. Mild mitral valve regurgitation.  6. The aortic valve is tricuspid. Aortic valve regurgitation is not visualized. Mild to moderate aortic valve sclerosis/calcification is present, without any evidence of aortic stenosis.  7. The inferior vena cava is dilated in size with <50% respiratory variability, suggesting right atrial pressure of 15 mmHg. FINDINGS  Left Ventricle: Left ventricular ejection fraction, by estimation, is 35 to 40%. The left ventricle has moderately decreased function. The left ventricle demonstrates regional wall motion abnormalities. The left ventricular internal cavity size was normal in size. There is mild left ventricular hypertrophy. The  interventricular septum is flattened in systole and diastole, consistent with right  ventricular pressure and volume overload. Left ventricular diastolic parameters are consistent with Grade III diastolic dysfunction (restrictive).  LV Wall Scoring: The basal inferior segment is akinetic. Right Ventricle: The right ventricular size is normal. No increase in right ventricular wall thickness. Right ventricular systolic function is normal. There is moderately elevated pulmonary artery systolic pressure. The tricuspid regurgitant velocity is 3.20 m/s, and with an assumed right atrial pressure of 15 mmHg, the estimated right ventricular systolic pressure is 31.5 mmHg. Left Atrium: Left atrial size was moderately dilated. Right Atrium: Right atrial size was mild to moderately dilated. Pericardium: There is no evidence of pericardial effusion. Mitral Valve: The mitral valve is abnormal. There is mild thickening of the mitral valve leaflet(s). There is mild calcification of the mitral valve leaflet(s). Moderate mitral annular calcification. Mild mitral valve regurgitation. Tricuspid Valve: The tricuspid valve is grossly normal. Tricuspid valve regurgitation is mild. Aortic Valve: The aortic valve is tricuspid. Aortic valve regurgitation is not visualized. Mild to moderate aortic valve sclerosis/calcification is present, without any evidence of aortic stenosis. Moderate aortic valve annular calcification. Aortic valve mean gradient measures 8.0 mmHg. Aortic valve peak gradient measures 14.5 mmHg. Aortic valve area, by VTI measures 1.03 cm. Pulmonic Valve: The pulmonic valve was grossly normal. Pulmonic valve regurgitation is trivial. Aorta: The aortic root is normal in size and structure. Venous: The inferior vena cava is dilated in size with less than 50% respiratory variability, suggesting right atrial pressure of 15 mmHg. IAS/Shunts: No atrial level shunt detected by color flow Doppler.  LEFT VENTRICLE PLAX 2D LVIDd:          5.00 cm      Diastology LVIDs:         4.30 cm      LV e' medial:   3.49 cm/s LV PW:         1.30 cm      LV E/e' medial: 45.8 LV IVS:        1.00 cm LVOT diam:     1.90 cm LV SV:         46 LV SV Index:   22 LVOT Area:     2.84 cm  LV Volumes (MOD) LV vol d, MOD A2C: 102.0 ml LV vol d, MOD A4C: 99.9 ml LV vol s, MOD A2C: 50.5 ml LV vol s, MOD A4C: 62.6 ml LV SV MOD A2C:     51.5 ml LV SV MOD A4C:     99.9 ml LV SV MOD BP:      46.1 ml RIGHT VENTRICLE TAPSE (M-mode): 0.9 cm LEFT ATRIUM             Index       RIGHT ATRIUM           Index LA diam:        5.00 cm 2.37 cm/m  RA Area:     24.20 cm LA Vol (A2C):   88.9 ml 42.17 ml/m RA Volume:   84.30 ml  39.98 ml/m LA Vol (A4C):   92.8 ml 44.02 ml/m LA Biplane Vol: 98.2 ml 46.58 ml/m  AORTIC VALVE AV Area (Vmax):    1.23 cm AV Area (Vmean):   1.12 cm AV Area (VTI):     1.03 cm AV Vmax:           190.50 cm/s AV Vmean:          132.500 cm/s AV VTI:  0.450 m AV Peak Grad:      14.5 mmHg AV Mean Grad:      8.0 mmHg LVOT Vmax:         82.50 cm/s LVOT Vmean:        52.200 cm/s LVOT VTI:          0.163 m LVOT/AV VTI ratio: 0.36  AORTA Ao Root diam: 3.30 cm MITRAL VALVE                TRICUSPID VALVE MV Area (PHT): 4.39 cm     TR Peak grad:   41.0 mmHg MV Decel Time: 173 msec     TR Vmax:        320.00 cm/s MV E velocity: 160.00 cm/s                             SHUNTS                             Systemic VTI:  0.16 m                             Systemic Diam: 1.90 cm Rozann Lesches MD Electronically signed by Rozann Lesches MD Signature Date/Time: 04/09/2020/1:33:14 PM    Final     Cardiac Studies   Echocardiogram 04/09/20: 1. Left ventricular ejection fraction, by estimation, is 35 to 40%. The  left ventricle has moderately decreased function. The left ventricle  demonstrates global hypokinesis with regional wall motion abnormalities  (see scoring diagram/findings for  description). There is mild left ventricular hypertrophy. Left ventricular    diastolic parameters are consistent with Grade III diastolic dysfunction  (restrictive). There is the interventricular septum is flattened in  systole and diastole, consistent with  right ventricular pressure and volume overload.  2. Right ventricular systolic function is normal. The right ventricular  size is normal. There is moderately elevated pulmonary artery systolic  pressure. The estimated right ventricular systolic pressure is 23.5 mmHg.  3. Left atrial size was moderately dilated.  4. Right atrial size was mild to moderately dilated.  5. The mitral valve is abnormal. Mild mitral valve regurgitation.  6. The aortic valve is tricuspid. Aortic valve regurgitation is not  visualized. Mild to moderate aortic valve sclerosis/calcification is  present, without any evidence of aortic stenosis.  7. The inferior vena cava is dilated in size with <50% respiratory  variability, suggesting right atrial pressure of 15 mmHg.   Patient Profile     86 y.o. male with PMH of CAD s/p CABG (LHC in 2014 with patent LIMA to LAD, occluded sequential radial graft to OM1-OM 2-PDA, occluded RCA with left-to-right collaterals), ICM, chronic combined systolic and diastolic heart failure, PAD s/p intervention to L external iliac in 03/2015, HTN, HLD, persistent atrial fibrillation on Eliquis, hypothyroidism, tobacco abuse and COPD, who presented with N/V and fall out of bed when rolling over to vomit, found to have elevated troponins, for which cardiology is following.   Assessment & Plan    1. NSTEMI: patient presented after a fall out of bed and N/V. Found to have elevated trops - peaked at 8033 and trended down. Decision made to pursue a LHC after eliquis wash out - planned for later today - Keep NPO for heart cath later today.  - Continue aspirin and zetia - Continue carvedilol  2. Ischemic cardiomyopathy/ chronic diastolic CHF: Echo this admission with EF 35-40%, improved from 30-35% 12/2018. He  appears euvolemic on exam - Anticipate restarting hydralazine and entresto following cath - Continue carvedilol and lasix - Continue to monitor strict I&Os and daily weights  3. Persistent atrial fibrillation: rates fairly well controlled. No complaints of bleeding with eliquis. Eliquis held yesterday in anticipation of LHC - Anticipate resuming eliquis for stroke ppx following LHC today - Continue carvedilol for rate control  4. COPD exacerbation: continues to have some mild wheezing on exam today.  - Continue management per primary team  5. CKD stage 3: Cr stable at 1.52 today. Receiving gentle fluids in anticipation of LHC today.  - Continue to monitor closely  For questions or updates, please contact [facility] Please consult [email or web address] for contact info under Cardiology/STEMI.      Signed, Abigail Butts, PA-C  04/11/2020, 7:30 AM   [phone]  Personally seen and examined. Agree with above.   86 year old with non-STEMI  Non-STEMI -Plan for heart catheterization today.  Previously went over risk and benefits including stroke heart attack death renal impairment bleeding.  Willing to proceed.  Discussed with wife as well.  Today is their 64th wedding anniversary. -His symptoms consisted of nausea, vomiting.  He never really described any typical chest discomfort.  His troponin however was 8000.  I think it behooves [place] to take another look at his bypass anatomy, make sure LIMA to LAD is patent.  Previously radial jump graft was occluded.  Per cath report in 11/5 2014: LIMA to LAD is patent Free Radial (? Y graft from LIMA) to OM1, OM2, PDA is occluded  Coronary artery disease -CABG 2003  Chronic kidney disease stage III -Hydrating precatheterization now.  75 cc an hour just started.  With his cardiomyopathy, we have been gentle.  Lasix on hold.  No ACE inhibitor currently.  Hyperlipidemia -Currently on Zetia 10 mg a day.  Ultimately would recommend statin therapy  however he may not have tolerated in the past.  COPD, secondary pulmonary hypertension -Previously counseled on cigar cessation. -Mucus production noted.  DuoNebs seems to help.  Left lower lobe malignant lung nodule in 2016 -Radiation therapy.  Candee Furbish, MD

## 2020-04-11 NOTE — Progress Notes (Signed)
ANTICOAGULATION CONSULT NOTE  Pharmacy Consult for heparin  Indication: chest pain/ACS  Assessment: 86 yo male with NSTEMI and plans for cath. He is on apixaban PTA (last dose 5/17 at 8am). Pharmacy consulted to dose heparin -Hg= 11.7, plt= 111 (history of thrombocytopenia) Initial aPTT therapeutic 72 sec  Goal of Therapy:  Heparin level 0.3-0.7 units/ml aPTT 66-102 seconds Monitor platelets by anticoagulation protocol: Yes   Plan:  -Continue heparin at 1100 units/hr -aPTT and heparin level in 6 hours to confirm  Thanks for allowing pharmacy to be a part of this patient's care.  Excell Seltzer, PharmD Clinical Pharmacist

## 2020-04-12 ENCOUNTER — Telehealth: Payer: Self-pay

## 2020-04-12 NOTE — Telephone Encounter (Signed)
Pt in hospital for MI, states pt needs PT/OT.Has fu 04/17/20 from [facility]. [facility] asks if Dr.Branch will write orders for PT/OT

## 2020-04-12 NOTE — Telephone Encounter (Signed)
Pt not in contact/have seen PMD Dr.Vyas. Is Dr.Branch willing to sign Home Health orders for Physical Therapy.  Please call [facility] [phone]   Thanks renee

## 2020-04-12 NOTE — Telephone Encounter (Signed)
Gary Odonnell in [place], left message to call back.What is PT for? How long ?

## 2020-04-13 NOTE — Telephone Encounter (Signed)
Ok to write orders, can we place a verbal for PT/OT  J Takila Kronberg MD

## 2020-04-13 NOTE — Telephone Encounter (Signed)
Elizabeth with [facility] made aware and took verbal order

## 2020-04-14 DIAGNOSIS — I214 Non-ST elevation (NSTEMI) myocardial infarction: Secondary | ICD-10-CM | POA: Diagnosis not present

## 2020-04-17 ENCOUNTER — Other Ambulatory Visit: Payer: Self-pay

## 2020-04-17 ENCOUNTER — Ambulatory Visit (INDEPENDENT_AMBULATORY_CARE_PROVIDER_SITE_OTHER): Payer: Medicare Other | Admitting: Cardiology

## 2020-04-17 ENCOUNTER — Encounter: Payer: Self-pay | Admitting: Cardiology

## 2020-04-17 VITALS — BP 130/60 | HR 70 | Ht 71.5 in | Wt 194.0 lb

## 2020-04-17 DIAGNOSIS — I2510 Atherosclerotic heart disease of native coronary artery without angina pectoris: Secondary | ICD-10-CM

## 2020-04-17 DIAGNOSIS — I5022 Chronic systolic (congestive) heart failure: Secondary | ICD-10-CM

## 2020-04-17 NOTE — Progress Notes (Signed)
Clinical Summary [name] is a 86 y.o.male seen today for follow up of the following medical problems. This is a focused visit for recent admission with NSTEMI.    1. CAD/ICM/Chronic systolic heart failure - history of prior CABG 2003 in [place]  - 09/2013 admission to [facility] with decompensated heart failure. Workup included an echo which showed an LVEF of 15%. He was transferred to [facility] for further management.  - cath 09/2013 showed LM 30%, LAD 100% proximal, LCX 50% ostial, RCA 100% mid with distal vessel filling with right to right and left to right collaterals. LIMA-LAD patent, graft from Geiger to OM1, OM2, PDA occluded. No PCI targets, recommendations for medical management. RHC mean PA 43, wedge 22, CI 1.74.  - repeat echo 04/2014 shows LVEF improved to 40-45%. - 01/2018 echo LVEF 35-40%, grade II diastolic dysfunction.       -admit 03/2020 with NSTEMI,actually presented with fall out of bed and N/V, no chest pain 03/2020 cath: severe 3 vessel disease, 1/4 patent grafts as reported below. Overall stable disease, recs for medical therapy - 03/2020 echo LVEF 35-40%, grade III DDx, PASP 56 - no recent symtoms since discahrge - no recent edema. Chronc stable SOB/DOE. No chest pain.      Has not had covid vaccine, not in favor at this time  Past Medical History:  Diagnosis Date  . Cancer ([place])    L lung  . CHF (congestive heart failure) ([place])    a. EF 40-45% in 2015, at EF 30-35% by echo in 12/2018  . Coronary artery disease    a. s/p CABG with most recent cath in 2014 showing patent LIMA-LAD, occluded Radial to OM1-OM2-PDA and occluded RCA with L--> R collaterals  . Hypercholesteremia   . Hypertension   . PAD (peripheral artery disease) ([place])   . Pneumonia 09/2013  . Radiation april 2016  . Solitary pulmonary nodule on lung CT 12/13/13   11/15/14  . Stroke [facility]) 11/2008     Allergies  Allergen Reactions  . Statins Other (See Comments)     Intolerance per outpt Cardiology notes     Current Outpatient Medications  Medication Sig Dispense Refill  . albuterol (VENTOLIN HFA) 108 (90 Base) MCG/ACT inhaler Inhale 2 puffs into the lungs every 6 (six) hours as needed for wheezing or shortness of breath. 8 g 0  . apixaban (ELIQUIS) 5 MG TABS tablet Take 1 tablet (5 mg total) by mouth 2 (two) times daily. 180 tablet 3  . aspirin 81 MG EC tablet Take 1 tablet (81 mg total) by mouth daily. 30 tablet 0  . Carboxymethylcellulose Sodium (EYE DROPS OP) Apply 1 drop to eye daily as needed (dry eyes).     . carvedilol (COREG) 6.25 MG tablet Take 1 tablet (6.25 mg total) by mouth 2 (two) times daily with a meal. 180 tablet 2  . ezetimibe (ZETIA) 10 MG tablet Take 1 tablet (10 mg total) by mouth daily. 90 tablet 3  . fluticasone furoate-vilanterol (BREO ELLIPTA) 100-25 MCG/INH AEPB Inhale 1 puff into the lungs daily. 1 each 0  . furosemide (LASIX) 40 MG tablet Take one tab (40mg ) by mouth every other day alternating with 1 1/2 tabs (60mg ) every other day. (Patient taking differently: Take 40 mg by mouth See admin instructions. Take one tab (40mg ) by mouth every other day alternating with 1 1/2 tabs (60mg ) every other day.) 120 tablet 3  . guaiFENesin (MUCINEX) 600 MG 12 hr  tablet Take 1 tablet (600 mg total) by mouth 2 (two) times daily. 30 tablet 0  . hydrALAZINE (APRESOLINE) 25 MG tablet Take 37.5 mg by mouth every 8 (eight) hours.    . nitroGLYCERIN (NITROSTAT) 0.3 MG SL tablet Place 1 tablet (0.3 mg total) under the tongue every 5 (five) minutes as needed for chest pain. 100 tablet 3  . Potassium 99 MG TABS Take 1 tablet by mouth daily.    . sacubitril-valsartan (ENTRESTO) 49-51 MG Take 1 tablet by mouth 2 (two) times daily. 180 tablet 1   No current facility-administered medications for this visit.     Past Surgical History:  Procedure Laterality Date  . Angioplasty to Left femoral artery    . CARPAL TUNNEL RELEASE Left 05/11/2014    Procedure: LEFT CARPAL TUNNEL RELEASE;  Surgeon: Carole Civil, MD;  Location: [facility];  Service: Orthopedics;  Laterality: Left;  . COLONOSCOPY N/A 01/24/2015   Procedure: COLONOSCOPY;  Surgeon: Danie Binder, MD;  Location: [facility] END[facility] SU[facility];  Service: Endoscopy;  Laterality: N/A;  130  . CORONARY ARTERY BYPASS GRAFT     x4  . FEMORAL-POPLITEAL BYPASS GRAFT Right 10/23/2015   Procedure: BYPASS GRAFT FEMORAL TO BELOW KNEE POPLITEAL ARTERY USING RIGHT NON-REVERSED GREATER SAPPHENOUS VEIN;  Surgeon: [name], MD;  Location: [place];  Service: Vascular;  Laterality: Right;  . INTRAOPERATIVE ARTERIOGRAM Right 10/23/2015   Procedure: INTRA OPERATIVE ARTERIOGRAM - RIGHT LOWER LEG;  Surgeon: [name], MD;  Location: [place];  Service: Vascular;  Laterality: Right;  . KNEE ARTHROSCOPY Right   . LEFT AND RIGHT HEART CATHETERIZATION WITH CORONARY ANGIOGRAM N/A 09/29/2013   Procedure: LEFT AND RIGHT HEART CATHETERIZATION WITH CORONARY ANGIOGRAM;  Surgeon: Burnell Blanks, MD;  Location: [facility] CATH LAB;  Service: Cardiovascular;  Laterality: N/A;  . LEFT HEART CATH AND CORS/GRAFTS ANGIOGRAPHY N/A 04/11/2020   Procedure: LEFT HEART CATH AND CORS/GRAFTS ANGIOGRAPHY;  Surgeon: Burnell Blanks, MD;  Location: [place] CV LAB;  Service: Cardiovascular;  Laterality: N/A;  . PERIPHERAL VASCULAR CATHETERIZATION N/A 04/21/2015   Procedure: Abdominal Aortogram w/Lower Extremity;  Surgeon: [name], MD;  Location: [place] CV LAB;  Service: Cardiovascular;  Laterality: N/A;  . Stent to right femoral artery    . TRIGGER FINGER RELEASE Bilateral   . VEIN HARVEST Right 10/23/2015   Procedure: [place];  Surgeon: [name], MD;  Location: [place];  Service: Vascular;  Laterality: Right;     Allergies  Allergen Reactions  . Statins Other (See Comments)    Intolerance per outpt Cardiology notes      Family History  Problem Relation Age of  Onset  . Heart attack Father   . Hyperlipidemia Father   . Diabetes Mother   . Diabetes Brother   . Colon cancer Neg Hx      Social History [name] reports that he has been smoking cigars and cigarettes. He started smoking about 70 years ago. He has a 30.00 pack-year smoking history. He has never used smokeless tobacco. [name] reports no history of alcohol use.   Review of Systems CONSTITUTIONAL: No weight loss, fever, chills, weakness or fatigue.  HEENT: Eyes: No visual loss, blurred vision, double vision or yellow sclerae.No hearing loss, sneezing, congestion, runny nose or sore throat.  SKIN: No rash or itching.  CARDIOVASCULAR: per hpi RESPIRATORY: No shortness of breath, cough or sputum.  GASTROINTESTINAL: No anorexia, nausea, vomiting or diarrhea. No  abdominal pain or blood.  GENITOURINARY: No burning on urination, no polyuria NEUROLOGICAL: No headache, dizziness, syncope, paralysis, ataxia, numbness or tingling in the extremities. No change in bowel or bladder control.  MUSCULOSKELETAL: No muscle, back pain, joint pain or stiffness.  LYMPHATICS: No enlarged nodes. No history of splenectomy.  PSYCHIATRIC: No history of depression or anxiety.  ENDOCRINOLOGIC: No reports of sweating, cold or heat intolerance. No polyuria or polydipsia.  Marland Kitchen   Physical Examination Today's Vitals   04/17/20 1327  BP: 130/60  Pulse: 70  SpO2: 97%  Weight: 194 lb (88 kg)  Height: 5' 11.5" (1.816 m)   Body mass index is 26.68 kg/m.  Gen: resting comfortably, no acute distress HEENT: no scleral icterus, pupils equal round and reactive, no palptable cervical adenopathy,  CV: RRR, no m/rg, no jvd Resp: Clear to auscultation bilaterally GI: abdomen is soft, non-tender, non-distended, normal bowel sounds, no hepatosplenomegaly MSK: extremities are warm, no edema.  Skin: warm, no rash Neuro:  no focal deficits Psych: appropriate affect   Diagnostic Studies  09/29/13  Cath Hemodynamic Findings: Ao: 162/70  LV: 163/19/29  RA: 12 RV: 66/7/16  PA: 67/26 (mean 43)  PCWP: 22  Fick Cardiac Output: 3.5 L/min  Fick Cardiac Index: 1.74 L/min/m2  Central Aortic Saturation: 91%  Pulmonary Artery Saturation: 50%  Angiographic Findings:  Left main: Distal 30% stenosis.  Left Anterior Descending Artery: 100% proximal occlusion. Proximal, mid and distal vessel fills from the patent IMA graft.  Circumflex Artery: Large caliber vessel with large intermediate Daksha Koone. Diffuse non-obstructive plaque in the large bifurcating intermediate Anthoney Sheppard. The ostium of the Circumflex has 50% stenosis.  Right Coronary Artery: Large dominant vessel with 100% mid occlusion. The distal vessel fills right to right bridging collaterals and left to right collaterals.  Graft Anatomy:  LIMA to LAD is patent  Free Radial (? Y graft from LIMA) to OM1, OM2, PDA is occluded.  Left Ventricular Angiogram: Deferred.  Impression:  1. Triple vessel CAD with patent LIMA to LAD, occluded RCA with good collateral filling, moderate disease in Circumflex  2. No focal targets for PCI  3. Ischemic cardiomyopathy with acute systolic CHF   31/5/40 Echo LVEF 15-20%, moderate LVH, severe diffuse hypokinesis, restrictive diastolic dysfunction, multiple WMAs, moderate MR, severe RV dysfunction RV TAPSE 0.7 cm, PASP 61 mmHg.   12/2013 Lab TC 175 TG 111 LDL 116 HDL 37  TSH 7.7 HgbA1c 6.6 Na 143 K 4.5 Cr 1.22 GFR56   04/2014 Echo Study Conclusions  - Left ventricle: The cavity size was normal. Wall thickness was increased in a pattern of mild LVH. Systolic function was mildly to moderately reduced. The estimated ejection fraction was in the range of 40% to 45%. There is diastolic dysfunction, indeterminant grade. - Regional wall motion abnormality: Hypokinesis of the mid anterior and basal-mid anteroseptal myocardium. - Aortic valve: Moderately calcified annulus.  Trileaflet; moderately thickened leaflets. Valve area (VTI): 2.48 cm^2. Valve area (Vmax): 2.23 cm^2. - Mitral valve: Moderately calcified annulus. Mildly thickened leaflets . - Left atrium: The atrium was moderately dilated. - Right atrium: The atrium was mildly dilated. - Technically adequate study.   01/2015 Carotid US bilaterall 1-39%    01/2018 echo Study Conclusions  - Left ventricle: The cavity size was normal. Wall thickness was increased in a pattern of moderate LVH. Systolic function was moderately reduced. The estimated ejection fraction was in the range of 35% to 40%. Diffuse hypokinesis. Features are consistent with a pseudonormal left ventricular filling pattern,  with concomitant abnormal relaxation and increased filling pressure (grade 2 diastolic dysfunction). Doppler parameters are consistent with high ventricular filling pressure. - Regional wall motion abnormality: Moderate hypokinesis of the basal-mid anteroseptal, mid inferoseptal, and mid inferior myocardium. - Aortic valve: Moderately calcified annulus. Mildly thickened, mildly calcified leaflets. There appears to be at least mild (if not mild to moderate) calcific aortic valvular stenosis. I suspect peak velocity and mean gradient is underestimated due to depressed LV function. Peak velocity (S): 159 cm/s. Mean gradient (S): 6 mm Hg. Valve area (VTI): 1.66 cm^2. Valve area (Vmax): 1.73 cm^2. Valve area (Vmean): 1.52 cm^2. - Mitral valve: Calcified annulus. Restricted leaflet motion due to depressed LV function. There was mild regurgitation. Valve area by continuity equation (using LVOT flow): 1.21 cm^2. - Left atrium: The atrium was mildly dilated. - Right ventricle: Systolic function was moderately reduced. - Tricuspid valve: There was mild regurgitation  03/2020 cath  Prox RCA lesion is 100% stenosed.  Prox LAD lesion is 100% stenosed.  LIMA graft was  visualized by angiography and is normal in caliber.  Dist Cx lesion is 90% stenosed.  Ost Cx to Prox Cx lesion is 70% stenosed.  Lat Ramus lesion is 70% stenosed.  Ramus lesion is 70% stenosed.   1. Severe triple vessel CAD s/p 4V CABG with 1/4 patent grafts.  2. Occluded proximal LAD. Patent LIMA to LAD 3. Moderately severe proximal Circumflex stenosis, unchanged from last cath. Severe distal Circumflex disease. Too small for PCI.  4. Moderately severe intermediate disease, grossly unchanged since last cath  5. Chronically occluded proximal RCA. The distal vessel fills from left to right collaterals.  6. Normal filling pressures  Recommendations: He has moderately severe disease in the intermediate Dilan Novosad and the Circumflex. His LAD is chronically occluded but the LIMA graft to the mid LAD is patent. The RCA is chronically occluded and fills from left to right collaterals. There has been progression of his disease in the intermediate and Circumflex, however, in the absence of angina, I would continue medical management of his CAD at this time.      Assessment and Plan  1.CAD/Chronic systolic HF - recent admission with NSTEMI, cath as reported below. He was asymptomatic with stable anatomy, treated medically, ASA was added to his eliquis in setting of prior AFIB and recent NSTEMI - no recent symptoms - appears euvolemic, continue current meds      Arnoldo Lenis, M.D.

## 2020-04-17 NOTE — Patient Instructions (Signed)

## 2020-05-04 ENCOUNTER — Telehealth: Payer: Self-pay | Admitting: Cardiology

## 2020-05-04 MED ORDER — CARVEDILOL 6.25 MG PO TABS: 6.2500 mg | ORAL_TABLET | Freq: Two times a day (BID) | ORAL | 1 refills | Status: DC

## 2020-05-04 MED ORDER — APIXABAN 5 MG PO TABS: 5.0000 mg | ORAL_TABLET | Freq: Two times a day (BID) | ORAL | 1 refills | Status: DC

## 2020-05-04 NOTE — Telephone Encounter (Signed)
Needing refills on   apixaban (ELIQUIS) 5 MG TABS tablet [[id number]]   carvedilol (COREG) 6.25 MG tablet [[id number]]   Sent to [facility]

## 2020-05-04 NOTE — Telephone Encounter (Signed)
Medication sent to pharmacy  

## 2020-06-06 ENCOUNTER — Other Ambulatory Visit: Payer: Self-pay

## 2020-06-06 ENCOUNTER — Ambulatory Visit (INDEPENDENT_AMBULATORY_CARE_PROVIDER_SITE_OTHER): Payer: Medicare Other | Admitting: Family Medicine

## 2020-06-06 ENCOUNTER — Encounter: Payer: Self-pay | Admitting: Family Medicine

## 2020-06-06 VITALS — BP 101/49 | HR 61 | Temp 97.4°F | Ht 71.5 in | Wt 193.0 lb

## 2020-06-06 DIAGNOSIS — J449 Chronic obstructive pulmonary disease, unspecified: Secondary | ICD-10-CM

## 2020-06-06 DIAGNOSIS — I739 Peripheral vascular disease, unspecified: Secondary | ICD-10-CM

## 2020-06-06 DIAGNOSIS — G8929 Other chronic pain: Secondary | ICD-10-CM | POA: Diagnosis not present

## 2020-06-06 DIAGNOSIS — M545 Low back pain, unspecified: Secondary | ICD-10-CM

## 2020-06-06 DIAGNOSIS — R7989 Other specified abnormal findings of blood chemistry: Secondary | ICD-10-CM

## 2020-06-06 DIAGNOSIS — E119 Type 2 diabetes mellitus without complications: Secondary | ICD-10-CM

## 2020-06-06 DIAGNOSIS — I5042 Chronic combined systolic (congestive) and diastolic (congestive) heart failure: Secondary | ICD-10-CM

## 2020-06-06 DIAGNOSIS — I159 Secondary hypertension, unspecified: Secondary | ICD-10-CM | POA: Diagnosis not present

## 2020-06-06 DIAGNOSIS — I2510 Atherosclerotic heart disease of native coronary artery without angina pectoris: Secondary | ICD-10-CM | POA: Diagnosis not present

## 2020-06-06 DIAGNOSIS — I482 Chronic atrial fibrillation, unspecified: Secondary | ICD-10-CM | POA: Diagnosis not present

## 2020-06-06 MED ORDER — FLUTICASONE FUROATE-VILANTEROL 100-25 MCG/INH IN AEPB: 1.0000 | INHALATION_SPRAY | Freq: Every day | RESPIRATORY_TRACT | 2 refills | Status: DC

## 2020-06-06 MED ORDER — HYDRALAZINE HCL 25 MG PO TABS: 37.5000 mg | ORAL_TABLET | Freq: Three times a day (TID) | ORAL | 1 refills | Status: DC

## 2020-06-06 MED ORDER — SACUBITRIL-VALSARTAN 49-51 MG PO TABS: 1.0000 | ORAL_TABLET | Freq: Two times a day (BID) | ORAL | 1 refills | Status: DC

## 2020-06-06 NOTE — Progress Notes (Signed)
New Patient Office Visit  Assessment & Plan:  1. Secondary hypertension - Well controlled on current regimen. Managed by cardiology.  - Potassium 99 MG TABS; Take by mouth. - CMP14+EGFR - Lipid panel - hydrALAZINE (APRESOLINE) 25 MG tablet; Take 1.5 tablets (37.5 mg total) by mouth every 8 (eight) hours.  Dispense: 405 tablet; Refill: 1 - sacubitril-valsartan (ENTRESTO) 49-51 MG; Take 1 tablet by mouth 2 (two) times daily.  Dispense: 180 tablet; Refill: 1  2. Chronic atrial fibrillation (HCC) - Well controlled on current regimen. Managed by cardiology.  - CBC with Differential/Platelet - CMP14+EGFR  3. Chronic combined systolic and diastolic CHF (congestive heart failure) ([place]) - Well controlled on current regimen. Managed by cardiology.  - CMP14+EGFR  4. Atherosclerosis of native coronary artery of native heart without angina pectoris - Managed by cardiology.  - CMP14+EGFR - Lipid panel  5. PAD (peripheral artery disease) ([place]) - Managed by cardiology.  - CMP14+EGFR - Lipid panel  6. Abnormal TSH - Thyroid Panel With TSH  7. Diet-controlled diabetes mellitus ([place]) - Most recent A1c <3 months ago at 7.0 appropriate for age.   8. Chronic obstructive pulmonary disease, unspecified COPD type ([place]) - Well controlled on current regimen.  - fluticasone furoate-vilanterol (BREO ELLIPTA) 100-25 MCG/INH AEPB; Inhale 1 puff into the lungs daily.  Dispense: 180 each; Refill: 2  9. Chronic midline low back pain without sciatica - Ambulatory referral to Neurosurgery   Follow-up: Return in about 3 months (around 09/06/2020) for follow-up of chronic medication conditions.   Hendricks Limes, MSN, APRN, FNP-C [facility]  Subjective:  Patient ID: Gary Odonnell, male    DOB: 1932-04-06  Age: 86 y.o. MRN: [id number]  Patient Care Team: Loman Brooklyn, FNP as PCP - General (Family Medicine) Harl Bowie Alphonse Guild, MD as PCP - Cardiology (Cardiology) Danie Binder, MD (Inactive) as Consulting Physician (Gastroenterology)  CC:  Chief Complaint  Patient presents with  . New Patient (Initial Visit)    Dr. Woody Seller - [place]   . Establish Care  . Back Pain    Patient states it has been going on x 7 years.    HPI Gary Odonnell presents to establish care.   Patient is accompanied by his wife who he is okay with being present.  Using [employer] daily and albuterol as needed for COPD.  CAD, hyperlipidemia, heart failure, atrial fibrillation, and hypertension managed by cardiologist.  Patient is taking Eliquis, aspirin, carvedilol, Zetia, furosemide, hydralazine, Entresto, and has nitroglycerin as needed.  Patient has diabetes that is diet controlled.  Most recent A1c 7.0 on 04/08/2020.  Patient complains of back pain that has been ongoing for the past 7 years.  His pain started after his dog pulled him off the lawnmower headfirst over the steering wheel.  Patient has fallen out of bed a few times.  Reports his back pain is getting worse.  He is unable to exercise due to the pain.  He tried to get on the treadmill the other day and was only able to walk for a minute and a half before he had to get off because of the pain.  Tylenol is not effective.  He would like a referral to a back specialist.  Lumbar spine x-ray on 01/13/2019 showed a remote T12 compression fracture, generalized disc narrowing, and lower lumbar degenerative facet spurring.  He had a lumbar spine MRI on 05/25/2018 that showed the old compression fracture of T12 and disc bulging with  the following impression: 1. Severe spinal stenosis at L3-4 due to a combination of a broad-based disc bulge and marked hypertrophy of the ligamentum flavum and facet joints. The nerve roots are trapped at this level. 2. Disc protrusion in the lateral aspect of left neural foramen at L4-5 which could affect the left L4 nerve. 3. Bilateral lateral recess narrowing at L2-3 without focal neural  impingement.   Review of Systems  Constitutional: Negative for chills, fever, malaise/fatigue and weight loss.  HENT: Negative for congestion, ear discharge, ear pain, nosebleeds, sinus pain, sore throat and tinnitus.   Eyes: Negative for blurred vision, double vision, pain, discharge and redness.  Respiratory: Negative for cough, shortness of breath and wheezing.   Cardiovascular: Negative for chest pain, palpitations and leg swelling.  Gastrointestinal: Negative for abdominal pain, constipation, diarrhea, heartburn, nausea and vomiting.  Genitourinary: Negative for dysuria, frequency and urgency.  Musculoskeletal: Positive for back pain and falls. Negative for myalgias.  Skin: Negative for rash.  Neurological: Negative for dizziness, seizures, weakness and headaches.  Psychiatric/Behavioral: Negative for depression, substance abuse and suicidal ideas. The patient is not nervous/anxious.     Current Outpatient Medications:  .  albuterol (VENTOLIN HFA) 108 (90 Base) MCG/ACT inhaler, Inhale 2 puffs into the lungs every 6 (six) hours as needed for wheezing or shortness of breath., Disp: 8 g, Rfl: 0 .  apixaban (ELIQUIS) 5 MG TABS tablet, Take 1 tablet (5 mg total) by mouth 2 (two) times daily., Disp: 180 tablet, Rfl: 1 .  aspirin 81 MG EC tablet, Take 1 tablet (81 mg total) by mouth daily., Disp: 30 tablet, Rfl: 0 .  Carboxymethylcellulose Sodium (EYE DROPS OP), Apply 1 drop to eye daily as needed (dry eyes). , Disp: , Rfl:  .  carvedilol (COREG) 6.25 MG tablet, Take 1 tablet (6.25 mg total) by mouth 2 (two) times daily with a meal., Disp: 180 tablet, Rfl: 1 .  ezetimibe (ZETIA) 10 MG tablet, Take 1 tablet (10 mg total) by mouth daily., Disp: 90 tablet, Rfl: 3 .  fluticasone furoate-vilanterol (BREO ELLIPTA) 100-25 MCG/INH AEPB, Inhale 1 puff into the lungs daily., Disp: 1 each, Rfl: 0 .  furosemide (LASIX) 40 MG tablet, Take one tab (52m) by mouth every other day alternating with 1 1/2 tabs  (63m every other day. (Patient taking differently: Take 40 mg by mouth See admin instructions. Take one tab (4057mby mouth every other day alternating with 1 1/2 tabs (52m72mvery other day.), Disp: 120 tablet, Rfl: 3 .  hydrALAZINE (APRESOLINE) 25 MG tablet, Take 37.5 mg by mouth every 8 (eight) hours., Disp: , Rfl:  .  nitroGLYCERIN (NITROSTAT) 0.3 MG SL tablet, Place 1 tablet (0.3 mg total) under the tongue every 5 (five) minutes as needed for chest pain., Disp: 100 tablet, Rfl: 3 .  Potassium 99 MG TABS, Take by mouth., Disp: , Rfl:  .  sacubitril-valsartan (ENTRESTO) 49-51 MG, Take 1 tablet by mouth 2 (two) times daily., Disp: 180 tablet, Rfl: 1  Allergies  Allergen Reactions  . Statins Other (See Comments)    Intolerance per outpt Cardiology notes    Past Medical History:  Diagnosis Date  . Cancer (HCC)[place] L lung  . CHF (congestive heart failure) (HCC)[place] a. EF 40-45% in 2015, at EF 30-35% by echo in 12/2018  . Coronary artery disease    a. s/p CABG with most recent cath in 2014 showing patent LIMA-LAD, occluded Radial to OM1-OM2-PDA and occluded  RCA with L--> R collaterals  . Hypercholesteremia   . Hypertension   . PAD (peripheral artery disease) ([place])   . Pneumonia 09/2013  . Primary cancer of left lower lobe of lung ([place]) 02/02/2015  . Radiation april 2016  . Solitary pulmonary nodule on lung CT 12/13/13   11/15/14  . Stroke [facility]) 11/2008    Past Surgical History:  Procedure Laterality Date  . Angioplasty to Left femoral artery    . CARPAL TUNNEL RELEASE Left 05/11/2014   Procedure: LEFT CARPAL TUNNEL RELEASE;  Surgeon: Carole Civil, MD;  Location: [facility] OR[facility];  Service: Orthopedics;  Laterality: Left;  . COLONOSCOPY N/A 01/24/2015   Procedure: COLONOSCOPY;  Surgeon: Danie Binder, MD;  Location: [facility];  Service: Endoscopy;  Laterality: N/A;  130  . CORONARY ARTERY BYPASS GRAFT     x4  . FEMORAL-POPLITEAL BYPASS GRAFT Right 10/23/2015   Procedure: BYPASS GRAFT  FEMORAL TO BELOW KNEE POPLITEAL ARTERY USING RIGHT NON-REVERSED GREATER SAPPHENOUS VEIN;  Surgeon: [name], MD;  Location: [place];  Service: Vascular;  Laterality: Right;  . INTRAOPERATIVE ARTERIOGRAM Right 10/23/2015   Procedure: INTRA OPERATIVE ARTERIOGRAM - RIGHT LOWER LEG;  Surgeon: [name], MD;  Location: [place];  Service: Vascular;  Laterality: Right;  . KNEE ARTHROSCOPY Right   . LEFT AND RIGHT HEART CATHETERIZATION WITH CORONARY ANGIOGRAM N/A 09/29/2013   Procedure: LEFT AND RIGHT HEART CATHETERIZATION WITH CORONARY ANGIOGRAM;  Surgeon: Burnell Blanks, MD;  Location[facility] C[facility] LAB;  Service: Cardiovascular;  Laterality: N/A;  . LEFT HEART CATH AND CORS/GRAFTS ANGIOGRAPHY N/A 04/11/2020   Procedure: LEFT HEART CATH AND CORS/GRAFTS ANGIOGRAPHY;  Surgeon: Burnell Blanks, MD;  Location: [place] CV LAB;  Service: Cardiovascular;  Laterality: N/A;  . PERIPHERAL VASCULAR CATHETERIZATION N/A 04/21/2015   Procedure: Abdominal Aortogram w/Lower Extremity;  Surgeon: [name], MD;  Location: [place] CV LAB;  Service: Cardiovascular;  Laterality: N/A;  . Stent to right femoral artery    . TRIGGER FINGER RELEASE Bilateral   . VEIN HARVEST Right 10/23/2015   Procedure: [place];  Surgeon: [name], MD;  Location: [place];  Service: Vascular;  Laterality: Right;    Family History  Problem Relation Age of Onset  . Heart attack Father   . Hyperlipidemia Father   . Diabetes Mother   . Diabetes Brother   . Heart Problems Brother   . Colon cancer Neg Hx     Social History   Socioeconomic History  . Marital status: Married    Spouse name: Not on file  . Number of children: Not on file  . Years of education: Not on file  . Highest education level: Not on file  Occupational History  . Not on file  Tobacco Use  . Smoking status: Current Every Day Smoker    Packs/day: 0.50    Years: 60.00    Pack years: 30.00     Types: Cigars    Start date: 11/21/1949  . Smokeless tobacco: Never Used  . Tobacco comment: 1-2 cigars per day   Vaping Use  . Vaping Use: Never used  Substance and Sexual Activity  . Alcohol use: No    Alcohol/week: 0.0 standard drinks  . Drug use: No  . Sexual activity: Not on file  Other Topics Concern  . Not on file  Social History Narrative  . Not on file   Social Determinants of Health   Financial  Resource Strain:   . Difficulty of Paying Living Expenses:   Food Insecurity:   . Worried About Charity fundraiser in the Last Year:   . Arboriculturist in the Last Year:   Transportation Needs:   . Film/video editor (Medical):   Marland Kitchen Lack of Transportation (Non-Medical):   Physical Activity:   . Days of Exercise per Week:   . Minutes of Exercise per Session:   Stress:   . Feeling of Stress :   Social Connections:   . Frequency of Communication with Friends and Family:   . Frequency of Social Gatherings with Friends and Family:   . Attends Religious Services:   . Active Member of Clubs or Organizations:   . Attends Archivist Meetings:   Marland Kitchen Marital Status:   Intimate Partner Violence:   . Fear of Current or Ex-Partner:   . Emotionally Abused:   Marland Kitchen Physically Abused:   . Sexually Abused:     Objective:   Today's Vitals: BP (!) 101/49   Pulse 61   Temp (!) 97.4 F (36.3 C) (Temporal)   Ht 5' 11.5" (1.816 m)   Wt 193 lb (87.5 kg)   SpO2 99%   BMI 26.54 kg/m   Physical Exam Vitals reviewed.  Constitutional:      General: He is not in acute distress.    Appearance: Normal appearance. He is overweight. He is not ill-appearing, toxic-appearing or diaphoretic.  HENT:     Head: Normocephalic and atraumatic.  Eyes:     General: No scleral icterus.       Right eye: No discharge.        Left eye: No discharge.     Conjunctiva/sclera: Conjunctivae normal.  Cardiovascular:     Rate and Rhythm: Normal rate and regular rhythm.     Heart sounds: Normal  heart sounds. No murmur heard.  No friction rub. No gallop.   Pulmonary:     Effort: Pulmonary effort is normal. No respiratory distress.     Breath sounds: Normal breath sounds. No stridor. No wheezing, rhonchi or rales.  Musculoskeletal:        General: Normal range of motion.     Cervical back: Normal range of motion.  Skin:    General: Skin is warm and dry.  Neurological:     Mental Status: He is alert and oriented to person, place, and time. Mental status is at baseline.  Psychiatric:        Mood and Affect: Mood normal.        Behavior: Behavior normal.        Thought Content: Thought content normal.        Judgment: Judgment normal.

## 2020-06-07 ENCOUNTER — Other Ambulatory Visit: Payer: Self-pay | Admitting: Family Medicine

## 2020-06-07 DIAGNOSIS — I2510 Atherosclerotic heart disease of native coronary artery without angina pectoris: Secondary | ICD-10-CM

## 2020-06-07 DIAGNOSIS — I739 Peripheral vascular disease, unspecified: Secondary | ICD-10-CM

## 2020-06-07 DIAGNOSIS — E119 Type 2 diabetes mellitus without complications: Secondary | ICD-10-CM

## 2020-06-07 DIAGNOSIS — I252 Old myocardial infarction: Secondary | ICD-10-CM

## 2020-06-07 LAB — CBC WITH DIFFERENTIAL/PLATELET
Basophils Absolute: 0 10*3/uL (ref 0.0–0.2)
Basos: 1 %
EOS (ABSOLUTE): 0.4 10*3/uL (ref 0.0–0.4)
Eos: 7 %
Hematocrit: 41.6 % (ref 37.5–51.0)
Hemoglobin: 13.6 g/dL (ref 13.0–17.7)
Immature Grans (Abs): 0 10*3/uL (ref 0.0–0.1)
Immature Granulocytes: 0 %
Lymphocytes Absolute: 1.2 10*3/uL (ref 0.7–3.1)
Lymphs: 19 %
MCH: 31.3 pg (ref 26.6–33.0)
MCHC: 32.7 g/dL (ref 31.5–35.7)
MCV: 96 fL (ref 79–97)
Monocytes Absolute: 0.4 10*3/uL (ref 0.1–0.9)
Monocytes: 6 %
Neutrophils Absolute: 4.3 10*3/uL (ref 1.4–7.0)
Neutrophils: 67 %
Platelets: 141 10*3/uL — ABNORMAL LOW (ref 150–450)
RBC: 4.35 x10E6/uL (ref 4.14–5.80)
RDW: 13.4 % (ref 11.6–15.4)
WBC: 6.3 10*3/uL (ref 3.4–10.8)

## 2020-06-07 LAB — CMP14+EGFR
ALT: 14 IU/L (ref 0–44)
AST: 13 IU/L (ref 0–40)
Albumin/Globulin Ratio: 1.7 (ref 1.2–2.2)
Albumin: 4.3 g/dL (ref 3.6–4.6)
Alkaline Phosphatase: 65 IU/L (ref 48–121)
BUN/Creatinine Ratio: 14 (ref 10–24)
BUN: 21 mg/dL (ref 8–27)
Bilirubin Total: 0.7 mg/dL (ref 0.0–1.2)
CO2: 27 mmol/L (ref 20–29)
Calcium: 9.8 mg/dL (ref 8.6–10.2)
Chloride: 103 mmol/L (ref 96–106)
Creatinine, Ser: 1.45 mg/dL — ABNORMAL HIGH (ref 0.76–1.27)
GFR calc Af Amer: 50 mL/min/{1.73_m2} — ABNORMAL LOW (ref >59)
GFR calc non Af Amer: 44 mL/min/{1.73_m2} — ABNORMAL LOW (ref >59)
Globulin, Total: 2.5 g/dL (ref 1.5–4.5)
Glucose: 143 mg/dL — ABNORMAL HIGH (ref 65–99)
Potassium: 4.3 mmol/L (ref 3.5–5.2)
Sodium: 141 mmol/L (ref 134–144)
Total Protein: 6.8 g/dL (ref 6.0–8.5)

## 2020-06-07 LAB — THYROID PANEL WITH TSH
Free Thyroxine Index: 1.7 (ref 1.2–4.9)
T3 Uptake Ratio: 25 % (ref 24–39)
T4, Total: 6.7 ug/dL (ref 4.5–12.0)
TSH: 8.9 u[IU]/mL — ABNORMAL HIGH (ref 0.450–4.500)

## 2020-06-07 LAB — LIPID PANEL
Chol/HDL Ratio: 5.3 ratio — ABNORMAL HIGH (ref 0.0–5.0)
Cholesterol, Total: 176 mg/dL (ref 100–199)
HDL: 33 mg/dL — ABNORMAL LOW (ref >39)
LDL Chol Calc (NIH): 116 mg/dL — ABNORMAL HIGH (ref 0–99)
Triglycerides: 149 mg/dL (ref 0–149)
VLDL Cholesterol Cal: 27 mg/dL (ref 5–40)

## 2020-06-07 MED ORDER — NEXLIZET 180-10 MG PO TABS: 1.0000 | ORAL_TABLET | Freq: Every day | ORAL | 1 refills | Status: DC

## 2020-06-08 ENCOUNTER — Other Ambulatory Visit: Payer: Self-pay | Admitting: Family Medicine

## 2020-06-08 DIAGNOSIS — N1832 Chronic kidney disease, stage 3b: Secondary | ICD-10-CM | POA: Insufficient documentation

## 2020-06-08 MED ORDER — DAPAGLIFLOZIN PROPANEDIOL 10 MG PO TABS: ORAL_TABLET | ORAL | 1 refills | Status: DC

## 2020-06-09 ENCOUNTER — Telehealth: Payer: Self-pay | Admitting: *Deleted

## 2020-06-09 ENCOUNTER — Encounter: Payer: Self-pay | Admitting: Family Medicine

## 2020-06-09 DIAGNOSIS — Z85118 Personal history of other malignant neoplasm of bronchus and lung: Secondary | ICD-10-CM | POA: Insufficient documentation

## 2020-06-09 DIAGNOSIS — E119 Type 2 diabetes mellitus without complications: Secondary | ICD-10-CM

## 2020-06-12 ENCOUNTER — Telehealth: Payer: Self-pay

## 2020-06-12 DIAGNOSIS — N1832 Chronic kidney disease, stage 3b: Secondary | ICD-10-CM

## 2020-06-12 NOTE — Telephone Encounter (Signed)
Nexlizet 180mg   Medication is not covered due to it not being prescribed by a endocrinologist, cardiologist or a lipidologist. Reference number - [id number]  Farxiga 10mg  PA received - requesting preferred  Preferred- Darlyne Russian XR  Reference- [id number]   Please advise on both medications.

## 2020-06-13 ENCOUNTER — Other Ambulatory Visit: Payer: Self-pay | Admitting: Cardiology

## 2020-06-13 MED ORDER — FUROSEMIDE 40 MG PO TABS: ORAL_TABLET | ORAL | 1 refills | Status: DC

## 2020-06-13 MED ORDER — EZETIMIBE 10 MG PO TABS
10.0000 mg | ORAL_TABLET | Freq: Every day | ORAL | 1 refills | Status: DC
Start: 2020-06-13 — End: 2020-12-11

## 2020-06-13 MED ORDER — EMPAGLIFLOZIN 10 MG PO TABS: 10.0000 mg | ORAL_TABLET | Freq: Every day | ORAL | 0 refills | Status: DC

## 2020-06-13 MED ORDER — EMPAGLIFLOZIN 25 MG PO TABS: 25.0000 mg | ORAL_TABLET | Freq: Every day | ORAL | 0 refills | Status: DC

## 2020-06-13 NOTE — Telephone Encounter (Signed)
I will message his cardiologist regarding Nexlizet.  I have D/C'd [place] and prescribed Jardiance. I would like him to take 10 mg for the first month and then increase to the 25 mg tablet. I sent the 10 mg to [facility] since it is not a 90 day supply, but I sent the 25 mg to [facility].

## 2020-06-13 NOTE — Telephone Encounter (Signed)
Medication sent to pharmacy  

## 2020-06-13 NOTE — Telephone Encounter (Signed)
This is the PA we just did together that I said I would sent to you.

## 2020-06-13 NOTE — Telephone Encounter (Signed)
[place]: [id number] - PA Case ID: [id number] - Rx #: [id number]  PA completed (urgent) for Jardiance 10mg  daily  F/u in 3 business days for this plan

## 2020-06-13 NOTE — Telephone Encounter (Signed)
Gary Odonnell is not covered by pt plan  meds that are cover with no PA are as follows: Glipizide ER Glipizide Glimepiride Metformin HCL Metformin HCL ER Glipizide-metformin HCL Lantus solo star   meds that are cover with PA required are as follows: Januvia   Please make changes and send to pharm

## 2020-06-13 NOTE — Telephone Encounter (Signed)
    1. Which medications need to be refilled? (please list name of each medication and dose if known)   Furosemide 40 mg  &    Zetia   2. Which pharmacy/location (including street and city if local pharmacy) is medication to be sent to?  [facility]   3. Do they need a 30 day or 90 day supply? [place]

## 2020-06-14 ENCOUNTER — Other Ambulatory Visit: Payer: Self-pay

## 2020-06-14 DIAGNOSIS — G8929 Other chronic pain: Secondary | ICD-10-CM

## 2020-06-14 NOTE — Telephone Encounter (Signed)
XOVANV:[id number];KHTXHF:[id number];Review Type:Prior Auth;  Appeal Information: Attention:ATTN: [place] [id number]. RVUYE,BX,[id number] HFGBM:[phone] EMV:[phone];   Call placed to appeals dept [phone] (this was the wrong phone number   Call then placed to: [place] appeals [phone] option #1  Will start appeals process to make sure patient is approved for medication.  Appeals for to be faxed to [facility] in the next 24 hrs

## 2020-06-30 DIAGNOSIS — Z961 Presence of intraocular lens: Secondary | ICD-10-CM | POA: Diagnosis not present

## 2020-06-30 DIAGNOSIS — H02005 Unspecified entropion of left lower eyelid: Secondary | ICD-10-CM | POA: Diagnosis not present

## 2020-06-30 DIAGNOSIS — H04123 Dry eye syndrome of bilateral lacrimal glands: Secondary | ICD-10-CM | POA: Diagnosis not present

## 2020-06-30 DIAGNOSIS — H26493 Other secondary cataract, bilateral: Secondary | ICD-10-CM | POA: Diagnosis not present

## 2020-06-30 DIAGNOSIS — H524 Presbyopia: Secondary | ICD-10-CM | POA: Diagnosis not present

## 2020-07-04 ENCOUNTER — Encounter: Payer: Self-pay | Admitting: Family Medicine

## 2020-07-11 ENCOUNTER — Encounter: Payer: Self-pay | Admitting: Family Medicine

## 2020-07-11 ENCOUNTER — Ambulatory Visit (INDEPENDENT_AMBULATORY_CARE_PROVIDER_SITE_OTHER): Payer: Medicare Other | Admitting: Family Medicine

## 2020-07-11 ENCOUNTER — Other Ambulatory Visit: Payer: Self-pay

## 2020-07-11 VITALS — BP 120/55 | HR 52 | Temp 97.5°F | Ht 71.5 in | Wt 189.6 lb

## 2020-07-11 DIAGNOSIS — G8929 Other chronic pain: Secondary | ICD-10-CM | POA: Diagnosis not present

## 2020-07-11 DIAGNOSIS — M542 Cervicalgia: Secondary | ICD-10-CM | POA: Diagnosis not present

## 2020-07-11 NOTE — Patient Instructions (Addendum)
Call [facility] to schedule your appointment. The referral has already been sent over to them last month. [phone]

## 2020-07-11 NOTE — Progress Notes (Signed)
Assessment & Plan:  1. Chronic neck pain - Encouraged patient to call [facility] to schedule his appointment.    Return if symptoms worsen or fail to improve.  Hendricks Limes, MSN, APRN, FNP-C [facility]  Subjective:    Patient ID: Gary Odonnell, male    DOB: 1931-12-02, 86 y.o.   MRN: [id number]  Patient Care Team: Loman Brooklyn, FNP as PCP - General (Family Medicine) Harl Bowie Alphonse Guild, MD as PCP - Cardiology (Cardiology) Danie Binder, MD (Inactive) as Consulting Physician (Gastroenterology) [facility] as Consulting Physician (Urology)   Chief Complaint:  Chief Complaint  Patient presents with  . Neck Pain    Patient states he has been having neck pain x 4 months.    HPI: Gary Odonnell is a 86 y.o. male presenting on 07/11/2020 for Neck Pain (Patient states he has been having neck pain x 4 months.)  Neck: Patient complains of neck "crunching". Event that precipitate these symptoms: injured during a fall while getting out of bed. Onset of symptoms 4 months ago, gradually worsening since that time. Patient denies numbness, pain, paresthesias, stiffness, and weakness. Patient was previously referred to [facility] but does not have an upcoming appointment with them.   New complaints: None  Social history:  Relevant past medical, surgical, family and social history reviewed and updated as indicated. Interim medical history since our last visit reviewed.  Allergies and medications reviewed and updated.  DATA REVIEWED: CHART IN EPIC  ROS: Negative unless specifically indicated above in HPI.    Current Outpatient Medications:  .  albuterol (VENTOLIN HFA) 108 (90 Base) MCG/ACT inhaler, Inhale 2 puffs into the lungs every 6 (six) hours as needed for wheezing or shortness of breath., Disp: 8 g, Rfl: 0 .  apixaban (ELIQUIS) 5 MG TABS tablet, Take 1 tablet (5 mg total) by mouth 2 (two) times daily.,  Disp: 180 tablet, Rfl: 1 .  aspirin 81 MG EC tablet, Take 1 tablet (81 mg total) by mouth daily., Disp: 30 tablet, Rfl: 0 .  Bempedoic Acid-Ezetimibe (NEXLIZET) 180-10 MG TABS, Take 1 tablet by mouth daily., Disp: 90 tablet, Rfl: 1 .  Carboxymethylcellulose Sodium (EYE DROPS OP), Apply 1 drop to eye daily as needed (dry eyes). , Disp: , Rfl:  .  carvedilol (COREG) 6.25 MG tablet, Take 1 tablet (6.25 mg total) by mouth 2 (two) times daily with a meal., Disp: 180 tablet, Rfl: 1 .  empagliflozin (JARDIANCE) 10 MG TABS tablet, Take 1 tablet (10 mg total) by mouth daily., Disp: 30 tablet, Rfl: 0 .  empagliflozin (JARDIANCE) 25 MG TABS tablet, Take 1 tablet (25 mg total) by mouth daily., Disp: 90 tablet, Rfl: 0 .  ezetimibe (ZETIA) 10 MG tablet, Take 1 tablet (10 mg total) by mouth daily., Disp: 90 tablet, Rfl: 1 .  fluticasone furoate-vilanterol (BREO ELLIPTA) 100-25 MCG/INH AEPB, Inhale 1 puff into the lungs daily., Disp: 180 each, Rfl: 2 .  furosemide (LASIX) 40 MG tablet, Take one tab (40mg ) by mouth every other day alternating with 1 1/2 tabs (60mg ) every other day., Disp: 120 tablet, Rfl: 1 .  hydrALAZINE (APRESOLINE) 25 MG tablet, Take 1.5 tablets (37.5 mg total) by mouth every 8 (eight) hours., Disp: 405 tablet, Rfl: 1 .  nitroGLYCERIN (NITROSTAT) 0.3 MG SL tablet, Place 1 tablet (0.3 mg total) under the tongue every 5 (five) minutes as needed for chest pain., Disp: 100 tablet, Rfl: 3 .  Potassium  99 MG TABS, Take by mouth., Disp: , Rfl:  .  sacubitril-valsartan (ENTRESTO) 49-51 MG, Take 1 tablet by mouth 2 (two) times daily., Disp: 180 tablet, Rfl: 1   Allergies  Allergen Reactions  . Statins Other (See Comments)    Intolerance per outpt Cardiology notes   Past Medical History:  Diagnosis Date  . Abnormal TSH   . Atrial fibrillation ([place])   . Cancer ([place])    L lung  . CHF (congestive heart failure) ([place])    a. EF 40-45% in 2015, at EF 30-35% by echo in 12/2018  . COPD (chronic  obstructive pulmonary disease) ([place])   . Coronary artery disease    a. s/p CABG with most recent cath in 2014 showing patent LIMA-LAD, occluded Radial to OM1-OM2-PDA and occluded RCA with L--> R collaterals  . Diabetes ([place])   . Hypercholesteremia   . Hypertension   . PAD (peripheral artery disease) ([place])   . Pneumonia 09/2013  . Primary cancer of left lower lobe of lung ([place]) 02/02/2015  . Radiation april 2016  . Solitary pulmonary nodule on lung CT 12/13/13   11/15/14  . Spinal stenosis   . Stroke [facility]) 11/2008    Past Surgical History:  Procedure Laterality Date  . Angioplasty to Left femoral artery    . CARPAL TUNNEL RELEASE Left 05/11/2014   Procedure: LEFT CARPAL TUNNEL RELEASE;  Surgeon: Carole Civil, MD;  Location: [facility] ORS;  Service: Orthopedics;  Laterality: Left;  . COLONOSCOPY N/A 01/24/2015   Procedure: COLONOSCOPY;  Surgeon: Danie Binder, MD;  Location: [facility] END[facility] SUITE;  Service: Endoscopy;  Laterality: N/A;  130  . CORONARY ARTERY BYPASS GRAFT     x4  . FEMORAL-POPLITEAL BYPASS GRAFT Right 10/23/2015   Procedure: BYPASS GRAFT FEMORAL TO BELOW KNEE POPLITEAL ARTERY USING RIGHT NON-REVERSED GREATER SAPPHENOUS VEIN;  Surgeon: [name], MD;  Location: [place];  Service: Vascular;  Laterality: Right;  . INTRAOPERATIVE ARTERIOGRAM Right 10/23/2015   Procedure: INTRA OPERATIVE ARTERIOGRAM - RIGHT LOWER LEG;  Surgeon: [name], MD;  Location: [place];  Service: Vascular;  Laterality: Right;  . KNEE ARTHROSCOPY Right   . LEFT AND RIGHT HEART CATHETERIZATION WITH CORONARY ANGIOGRAM N/A 09/29/2013   Procedure: LEFT AND RIGHT HEART CATHETERIZATION WITH CORONARY ANGIOGRAM;  Surgeon: Burnell Blanks, MD;  Location: [facility] CATH LAB;  Service: Cardiovascular;  Laterality: N/A;  . LEFT HEART CATH AND CORS/GRAFTS ANGIOGRAPHY N/A 04/11/2020   Procedure: LEFT HEART CATH AND CORS/GRAFTS ANGIOGRAPHY;  Surgeon: Burnell Blanks, MD;  Location: [place] CV LAB;  Service:  Cardiovascular;  Laterality: N/A;  . PERIPHERAL VASCULAR CATHETERIZATION N/A 04/21/2015   Procedure: Abdominal Aortogram w/Lower Extremity;  Surgeon: [name], MD;  Location: [place] CV LAB;  Service: Cardiovascular;  Laterality: N/A;  . Stent to right femoral artery    . TRIGGER FINGER RELEASE Bilateral   . VEIN HARVEST Right 10/23/2015   Procedure: VEIN HARVEST - RIGHT GREATER SAPPHENOUS;  Surgeon: [name], MD;  Location: [place];  Service: Vascular;  Laterality: Right;    Social History   Socioeconomic History  . Marital status: Married    Spouse name: Not on file  . Number of children: Not on file  . Years of education: Not on file  . Highest education level: Not on file  Occupational History  . Not on file  Tobacco Use  . Smoking status: Current Every Day Smoker    Packs/day: 0.50  Years: 60.00    Pack years: 30.00    Types: Cigars    Start date: 11/21/1949  . Smokeless tobacco: Never Used  . Tobacco comment: 1-2 cigars per day   Vaping Use  . Vaping Use: Never used  Substance and Sexual Activity  . Alcohol use: No    Alcohol/week: 0.0 standard drinks  . Drug use: No  . Sexual activity: Not on file  Other Topics Concern  . Not on file  Social History Narrative  . Not on file   Social Determinants of Health   Financial Resource Strain:   . Difficulty of Paying Living Expenses:   Food Insecurity:   . Worried About Charity fundraiser in the Last Year:   . Arboriculturist in the Last Year:   Transportation Needs:   . Film/video editor (Medical):   Marland Kitchen Lack of Transportation (Non-Medical):   Physical Activity:   . Days of Exercise per Week:   . Minutes of Exercise per Session:   Stress:   . Feeling of Stress :   Social Connections:   . Frequency of Communication with Friends and Family:   . Frequency of Social Gatherings with Friends and Family:   . Attends Religious Services:   . Active Member of Clubs or Organizations:   . Attends  Archivist Meetings:   Marland Kitchen Marital Status:   Intimate Partner Violence:   . Fear of Current or Ex-Partner:   . Emotionally Abused:   Marland Kitchen Physically Abused:   . Sexually Abused:         Objective:    BP (!) 120/55   Pulse (!) 52   Temp (!) 97.5 F (36.4 C) (Temporal)   Ht 5' 11.5" (1.816 m)   Wt 189 lb 9.6 oz (86 kg)   SpO2 97%   BMI 26.08 kg/m   Wt Readings from Last 3 Encounters:  07/11/20 189 lb 9.6 oz (86 kg)  06/06/20 193 lb (87.5 kg)  04/17/20 194 lb (88 kg)    Physical Exam Vitals reviewed.  Constitutional:      General: He is not in acute distress.    Appearance: Normal appearance. He is not ill-appearing, toxic-appearing or diaphoretic.  HENT:     Head: Normocephalic and atraumatic.  Eyes:     General: No scleral icterus.       Right eye: No discharge.        Left eye: No discharge.     Conjunctiva/sclera: Conjunctivae normal.  Cardiovascular:     Rate and Rhythm: Normal rate.  Pulmonary:     Effort: Pulmonary effort is normal. No respiratory distress.  Musculoskeletal:        General: Normal range of motion.     Cervical back: Normal range of motion. Crepitus present. No edema, erythema, signs of trauma, rigidity or torticollis. No pain with movement, spinous process tenderness or muscular tenderness. Normal range of motion.  Skin:    General: Skin is warm and dry.  Neurological:     Mental Status: He is alert and oriented to person, place, and time. Mental status is at baseline.  Psychiatric:        Mood and Affect: Mood normal.        Behavior: Behavior normal.        Thought Content: Thought content normal.        Judgment: Judgment normal.     Lab Results  Component Value Date   TSH 8.900 (H)  06/06/2020   Lab Results  Component Value Date   WBC 6.3 06/06/2020   HGB 13.6 06/06/2020   HCT 41.6 06/06/2020   MCV 96 06/06/2020   PLT 141 (L) 06/06/2020   Lab Results  Component Value Date   NA 141 06/06/2020   K 4.3 06/06/2020    CO2 27 06/06/2020   GLUCOSE 143 (H) 06/06/2020   BUN 21 06/06/2020   CREATININE 1.45 (H) 06/06/2020   BILITOT 0.7 06/06/2020   ALKPHOS 65 06/06/2020   AST 13 06/06/2020   ALT 14 06/06/2020   PROT 6.8 06/06/2020   ALBUMIN 4.3 06/06/2020   CALCIUM 9.8 06/06/2020   ANIONGAP 9 04/11/2020   Lab Results  Component Value Date   CHOL 176 06/06/2020   Lab Results  Component Value Date   HDL 33 (L) 06/06/2020   Lab Results  Component Value Date   LDLCALC 116 (H) 06/06/2020   Lab Results  Component Value Date   TRIG 149 06/06/2020   Lab Results  Component Value Date   CHOLHDL 5.3 (H) 06/06/2020   Lab Results  Component Value Date   HGBA1C 7.0 (H) 04/08/2020

## 2020-07-21 DIAGNOSIS — M542 Cervicalgia: Secondary | ICD-10-CM | POA: Diagnosis not present

## 2020-07-21 DIAGNOSIS — M48061 Spinal stenosis, lumbar region without neurogenic claudication: Secondary | ICD-10-CM | POA: Diagnosis not present

## 2020-07-24 ENCOUNTER — Telehealth: Payer: Self-pay | Admitting: Cardiology

## 2020-07-24 NOTE — Telephone Encounter (Signed)
Gary Odonnell -spouse called in regards to patient's medication list. States that there are medications on his list that he does not take.

## 2020-07-24 NOTE — Telephone Encounter (Signed)
Reviewed pt medication list with wife - pt is not taking Jardiance - aware that we would not have given pt this as not a cardiac medication - also wanted to check on Breo inhaler and aware that [employer], [place] sent this in July to [facility] - pt will contact pharmacy as they have not received this

## 2020-07-26 DIAGNOSIS — M542 Cervicalgia: Secondary | ICD-10-CM | POA: Diagnosis not present

## 2020-07-28 DIAGNOSIS — M542 Cervicalgia: Secondary | ICD-10-CM | POA: Diagnosis not present

## 2020-08-02 DIAGNOSIS — Z6826 Body mass index (BMI) 26.0-26.9, adult: Secondary | ICD-10-CM | POA: Diagnosis not present

## 2020-08-02 DIAGNOSIS — I10 Essential (primary) hypertension: Secondary | ICD-10-CM | POA: Diagnosis not present

## 2020-08-02 DIAGNOSIS — M48062 Spinal stenosis, lumbar region with neurogenic claudication: Secondary | ICD-10-CM | POA: Diagnosis not present

## 2020-08-03 ENCOUNTER — Telehealth: Payer: Self-pay | Admitting: Pharmacist

## 2020-08-03 NOTE — Telephone Encounter (Signed)
Appeal [facility]ed to [facility] on 08/03/20 Paperwork in Gary Odonnell's office if needed

## 2020-08-04 ENCOUNTER — Other Ambulatory Visit: Payer: Self-pay | Admitting: Cardiology

## 2020-08-04 MED ORDER — APIXABAN 5 MG PO TABS: 5.0000 mg | ORAL_TABLET | Freq: Two times a day (BID) | ORAL | 1 refills | Status: DC

## 2020-08-04 MED ORDER — CARVEDILOL 6.25 MG PO TABS
6.2500 mg | ORAL_TABLET | Freq: Two times a day (BID) | ORAL | 1 refills | Status: DC
Start: 2020-08-04 — End: 2020-11-13

## 2020-08-04 NOTE — Telephone Encounter (Signed)
Medication sent to pharmacy  

## 2020-08-04 NOTE — Telephone Encounter (Signed)
     1. Which medications need to be refilled? (please list name of each medication and dose if known)   ELIQUIS 5 MG COREG   2. Which pharmacy/location (including street and city if local pharmacy) is medication to be sent to?   [facility]   3. Do they need a 30 day or 90 day supply? [place]

## 2020-08-09 NOTE — Telephone Encounter (Signed)
jardiance appeal was overturned and approved.  [facility] aware

## 2020-08-14 ENCOUNTER — Telehealth: Payer: Self-pay | Admitting: Pharmacist

## 2020-08-14 MED ORDER — DAPAGLIFLOZIN PROPANEDIOL 10 MG PO TABS: 10.0000 mg | ORAL_TABLET | Freq: Every day | ORAL | 3 refills | Status: DC

## 2020-08-14 NOTE — Telephone Encounter (Signed)
Farxiga [facility] to [facility]--must do PA under ICD: N18.32 Will be used for CKD and T2DM, however will get med approved under CKD : N18.32

## 2020-08-18 NOTE — Telephone Encounter (Signed)
Denied for Wilder Glade because [employer] doesn't recognize new indication Will have to appeal

## 2020-08-24 ENCOUNTER — Telehealth: Payer: Self-pay | Admitting: *Deleted

## 2020-08-24 NOTE — Telephone Encounter (Signed)
Ok to hold eliquis for 3 days prior to procedure   Zandra Abts MD

## 2020-08-24 NOTE — Telephone Encounter (Signed)
   [facility] Pre-operative Risk Assessment    HEARTCARE STAFF: - Please ensure there is not already an duplicate clearance open for this procedure. - Under Visit Info/Reason for Call, type in Other and utilize the format Clearance MM/DD/YY or Clearance TBD. Do not use dashes or single digits. - If request is for dental extraction, please clarify the # of teeth to be extracted.  Request for surgical clearance:  1. What type of surgery is being performed? Spinal injection  2. When is this surgery scheduled? TBD  3. What type of clearance is required (medical clearance vs. Pharmacy clearance to hold med vs. Both)? Pharmacy  4. Are there any medications that need to be held prior to surgery and how long? Eliquis 3-5 days  5. Practice name and name of physician performing surgery? [facility]  6. What is the office phone number? [phone]   7.   What is the office fax number? [phone]  8.   Anesthesia type (None, local, MAC, general) ? none   Marlou Sa 08/24/2020, 10:38 AM  _________________________________________________________________   (provider comments below)

## 2020-08-24 NOTE — Telephone Encounter (Signed)
Patient with diagnosis of afib on Eliquis for anticoagulation.    Procedure: Spinal injection Date of procedure: TBD  CHADS2-VASc score of  8 (CHF, HTN, AGE, DM2, stroke/tia x 2, CAD, AGE)  CrCl 31 ml/min  Normally would recommend holding anticoagulation for 3 days prior to procedure due to spinal procedure and risk of bleeding. However patient is at high risk off anticoagulation due to his history of stroke. I will defer to Dr. Harl Bowie.

## 2020-08-25 NOTE — Telephone Encounter (Signed)
   Primary Cardiologist: Carlyle Dolly, MD  Chart reviewed as part of pre-operative protocol coverage. Given past medical history and time since last visit, based on ACC/AHA guidelines, Gary Odonnell would be at acceptable risk for the planned procedure without further cardiovascular testing.   Patient with diagnosis of afib on Eliquis for anticoagulation.    Procedure: Spinal injection Date of procedure: TBD  CHADS2-VASc score of  8 (CHF, HTN, AGE, DM2, stroke/tia x 2, CAD, AGE)  CrCl 31 ml/min  He may hold his Eliquis for 3 days prior to the procedure.  Please resume as soon as hemostasis is achieved.  Patient was advised that if he develops new symptoms prior to surgery to contact our office to arrange a follow-up appointment.  He verbalized understanding.  I will route this recommendation to the requesting party via Epic fax function and remove from pre-op pool.  Please call with questions.  Jossie Ng. Leitha Hyppolite NP-C    08/25/2020, 8:54 AM [place] [address] Office [phone] Fax [phone]

## 2020-09-06 ENCOUNTER — Ambulatory Visit (INDEPENDENT_AMBULATORY_CARE_PROVIDER_SITE_OTHER): Payer: Medicare Other | Admitting: Family Medicine

## 2020-09-06 ENCOUNTER — Encounter: Payer: Self-pay | Admitting: Family Medicine

## 2020-09-06 ENCOUNTER — Other Ambulatory Visit: Payer: Self-pay

## 2020-09-06 VITALS — BP 142/69 | HR 61 | Temp 97.4°F | Ht 71.5 in | Wt 193.4 lb

## 2020-09-06 DIAGNOSIS — E119 Type 2 diabetes mellitus without complications: Secondary | ICD-10-CM

## 2020-09-06 DIAGNOSIS — J449 Chronic obstructive pulmonary disease, unspecified: Secondary | ICD-10-CM | POA: Diagnosis not present

## 2020-09-06 DIAGNOSIS — N1832 Chronic kidney disease, stage 3b: Secondary | ICD-10-CM | POA: Diagnosis not present

## 2020-09-06 DIAGNOSIS — R7989 Other specified abnormal findings of blood chemistry: Secondary | ICD-10-CM | POA: Diagnosis not present

## 2020-09-06 LAB — BAYER DCA HB A1C WAIVED: HB A1C (BAYER DCA - WAIVED): 6.5 % (ref <7.0)

## 2020-09-06 MED ORDER — FLUTICASONE FUROATE-VILANTEROL 100-25 MCG/INH IN AEPB: 1.0000 | INHALATION_SPRAY | Freq: Every day | RESPIRATORY_TRACT | 1 refills | Status: DC

## 2020-09-06 MED ORDER — DAPAGLIFLOZIN PROPANEDIOL 10 MG PO TABS: 10.0000 mg | ORAL_TABLET | Freq: Every day | ORAL | 1 refills | Status: DC

## 2020-09-06 NOTE — Progress Notes (Signed)
Assessment & Plan:  1. Diet-controlled diabetes mellitus ([place]) Lab Results  Component Value Date   HGBA1C 6.5 09/06/2020   HGBA1C 7.0 (H) 04/08/2020   HGBA1C 6.6 (H) 12/21/2019     - Diabetes is at goal of A1c < 7. - Medications: none - diet controlled - Patient is not currently taking a statin. Patient is taking an ACE-inhibitor/ARB.  - Last foot exam: 09/06/2020 - Last diabetic eye exam: scheduled for December - Urine Microalbumin/Creat Ratio: 09/06/2020 - Instruction/counseling given: reminded to get eye exam - Bayer DCA Hb A1c Waived - Microalbumin / creatinine urine ratio - CMP14+EGFR  2. Chronic obstructive pulmonary disease, unspecified COPD type ([place]) - Breo sent to pharmacy again. Advised patient if he does not receive he needs to call.  - fluticasone furoate-vilanterol (BREO ELLIPTA) 100-25 MCG/INH AEPB; Inhale 1 puff into the lungs daily.  Dispense: 180 each; Refill: 1  3. Stage 3b chronic kidney disease ([place]) - Working on appeal for [place].  - dapagliflozin propanediol (FARXIGA) 10 MG TABS tablet; Take 1 tablet (10 mg total) by mouth daily before breakfast.  Dispense: 90 tablet; Refill: 1 - CMP14+EGFR  4. Abnormal TSH - Thyroid Panel With TSH   Return in about 3 months (around 12/07/2020) for follow-up of chronic medication conditions.  Hendricks Limes, MSN, APRN, FNP-C [facility]  Subjective:    Patient ID: Gary Odonnell, male    DOB: June 09, 1932, 86 y.o.   MRN: [id number]  Patient Care Team: Loman Brooklyn, FNP as PCP - General (Family Medicine) Harl Bowie Alphonse Guild, MD as PCP - Cardiology (Cardiology) Danie Binder, MD (Inactive) as Consulting Physician (Gastroenterology) [facility] as Consulting Physician (Urology) Lavera Guise, [facility] (Pharmacist) [facility], [place] as Consulting Physician (Neurosurgery)   Chief Complaint:  Chief Complaint  Patient presents with  . Diabetes    3 month  follow up of chronic medical conditions  . Hypertension    HPI: Gary Odonnell is a 86 y.o. male presenting on 09/06/2020 for Diabetes (3 month follow up of chronic medical conditions) and Hypertension  Diabetes: Patient presents for follow up of diabetes. Current symptoms include: none. Known diabetic complications: cardiovascular disease, cerebrovascular disease and peripheral vascular disease. Medication compliance: none. Is he  on ACE inhibitor or angiotensin II receptor blocker? Yes. Is he on a statin? No, intolerant.   Lab Results  Component Value Date   HGBA1C 6.5 09/06/2020   HGBA1C 7.0 (H) 04/08/2020   HGBA1C 6.6 (H) 12/21/2019   Lab Results  Component Value Date   LDLCALC 116 (H) 06/06/2020   CREATININE 1.40 (H) 09/06/2020     Patient reports he has yet to receive his Breo from the pharmacy. He is therefore using Albuterol more frequently.   Wilder Glade was previously ordered and denied. An appeal was started by our clinical pharmacist.   We tried to switch him from Zetia to Stephen but were unable to get it covered since I am not an endocrinologist, cardiologist, or lipidologist.   New complaints: None  Social history:  Relevant past medical, surgical, family and social history reviewed and updated as indicated. Interim medical history since our last visit reviewed.  Allergies and medications reviewed and updated.  DATA REVIEWED: CHART IN EPIC  ROS: Negative unless specifically indicated above in HPI.    Current Outpatient Medications:  .  albuterol (VENTOLIN HFA) 108 (90 Base) MCG/ACT inhaler, Inhale 2 puffs into the lungs every 6 (six) hours as needed  for wheezing or shortness of breath., Disp: 8 g, Rfl: 0 .  apixaban (ELIQUIS) 5 MG TABS tablet, Take 1 tablet (5 mg total) by mouth 2 (two) times daily., Disp: 180 tablet, Rfl: 1 .  aspirin 81 MG EC tablet, Take 1 tablet (81 mg total) by mouth daily., Disp: 30 tablet, Rfl: 0 .  Carboxymethylcellulose Sodium (EYE  DROPS OP), Apply 1 drop to eye daily as needed (dry eyes). , Disp: , Rfl:  .  carvedilol (COREG) 6.25 MG tablet, Take 1 tablet (6.25 mg total) by mouth 2 (two) times daily with a meal., Disp: 180 tablet, Rfl: 1 .  dapagliflozin propanediol (FARXIGA) 10 MG TABS tablet, Take 1 tablet (10 mg total) by mouth daily before breakfast., Disp: 30 tablet, Rfl: 3 .  ezetimibe (ZETIA) 10 MG tablet, Take 1 tablet (10 mg total) by mouth daily., Disp: 90 tablet, Rfl: 1 .  fluticasone furoate-vilanterol (BREO ELLIPTA) 100-25 MCG/INH AEPB, Inhale 1 puff into the lungs daily., Disp: 180 each, Rfl: 2 .  furosemide (LASIX) 40 MG tablet, Take one tab (2m) by mouth every other day alternating with 1 1/2 tabs (620m every other day., Disp: 120 tablet, Rfl: 1 .  hydrALAZINE (APRESOLINE) 25 MG tablet, Take 1.5 tablets (37.5 mg total) by mouth every 8 (eight) hours., Disp: 405 tablet, Rfl: 1 .  nitroGLYCERIN (NITROSTAT) 0.3 MG SL tablet, Place 1 tablet (0.3 mg total) under the tongue every 5 (five) minutes as needed for chest pain., Disp: 100 tablet, Rfl: 3 .  Potassium 99 MG TABS, Take by mouth., Disp: , Rfl:  .  sacubitril-valsartan (ENTRESTO) 49-51 MG, Take 1 tablet by mouth 2 (two) times daily., Disp: 180 tablet, Rfl: 1   Allergies  Allergen Reactions  . Statins Other (See Comments)    Intolerance per outpt Cardiology notes   Past Medical History:  Diagnosis Date  . Abnormal TSH   . Atrial fibrillation ([place]  . Cancer ([place]   L lung  . CHF (congestive heart failure) ([place]   a. EF 40-45% in 2015, at EF 30-35% by echo in 12/2018  . COPD (chronic obstructive pulmonary disease) ([place]  . Coronary artery disease    a. s/p CABG with most recent cath in 2014 showing patent LIMA-LAD, occluded Radial to OM1-OM2-PDA and occluded RCA with L--> R collaterals  . Diabetes ([facility]  . Hypercholesteremia   . Hypertension   . PAD (peripheral artery disease) ([facility]CA[place]  . Pneumonia 09/2013  . Primary cancer of left lower  lobe of lung ([place]3/08/2015  . Radiation april 2016  . Solitary pulmonary nodule on lung CT 12/13/13   11/15/14  . Spinal stenosis   . [facility]    Past Surgical History:  Procedure Laterality Date  . Angioplasty to Left femoral artery    . CARPAL TUNNEL RELEASE Left 05/11/2014   Procedure: LEFT CARPAL TUNNEL RELEASE;  Surgeon: StCarole CivilMD;  Location: [facility] OR[facility];  Service: Orthopedics;  Laterality: Left;  . COLONOSCOPY N/A 01/24/2015   Procedure: COLONOSCOPY;  Surgeon: SaDanie BinderMD;  Location: [facility] END[facility] SU[facility];  Service: Endoscopy;  Laterality: N/A;  130  . CORONARY ARTERY BYPASS GRAFT     x4  . FEMORAL-POPLITEAL BYPASS GRAFT Right 10/23/2015   Procedure: BYPASS GRAFT FEMORAL TO BELOW KNEE POPLITEAL ARTERY USING RIGHT NON-REVERSED GREATER SAPPHENOUS VEIN;  Surgeon: ChElam DutchMD;  [place] Service: Vascular;  Laterality: Right;  . INTRAOPERATIVE ARTERIOGRAM Right  10/23/2015   Procedure: INTRA OPERATIVE ARTERIOGRAM - RIGHT LOWER LEG;  Surgeon: [name], MD;  Location: [place];  Service: Vascular;  Laterality: Right;  . KNEE ARTHROSCOPY Right   . LEFT AND RIGHT HEART CATHETERIZATION WITH CORONARY ANGIOGRAM N/A 09/29/2013   Procedure: LEFT AND RIGHT HEART CATHETERIZATION WITH CORONARY ANGIOGRAM;  Surgeon: Burnell Blanks, MD;  Location: [facility] CATH LAB;  Service: Cardiovascular;  Laterality: N/A;  . LEFT HEART CATH AND CORS/GRAFTS ANGIOGRAPHY N/A 04/11/2020   Procedure: LEFT HEART CATH AND CORS/GRAFTS ANGIOGRAPHY;  Surgeon: Burnell Blanks, MD;  Location: [place] CV LAB;  Service: Cardiovascular;  Laterality: N/A;  . PERIPHERAL VASCULAR CATHETERIZATION N/A 04/21/2015   Procedure: Abdominal Aortogram w/Lower Extremity;  Surgeon: [name], MD;  Location: [place] CV LAB;  Service: Cardiovascular;  Laterality: N/A;  . Stent to right femoral artery    . TRIGGER FINGER RELEASE Bilateral   . VEIN HARVEST Right 10/23/2015   Procedure:  VEIN HARVEST - RIGHT GREATER SAPPHENOUS;  Surgeon: [name], MD;  Location: [place];  Service: Vascular;  Laterality: Right;    Social History   Socioeconomic History  . Marital status: Married    Spouse name: Not on file  . Number of children: Not on file  . Years of education: Not on file  . Highest education level: Not on file  Occupational History  . Not on file  Tobacco Use  . Smoking status: Current Every Day Smoker    Packs/day: 0.50    Years: 60.00    Pack years: 30.00    Types: Cigars    Start date: 11/21/1949  . Smokeless tobacco: Never Used  . Tobacco comment: 1-2 cigars per day   Vaping Use  . Vaping Use: Never used  Substance and Sexual Activity  . Alcohol use: No    Alcohol/week: 0.0 standard drinks  . Drug use: No  . Sexual activity: Not on file  Other Topics Concern  . Not on file  Social History Narrative  . Not on file   Social Determinants of Health   Financial Resource Strain:   . Difficulty of Paying Living Expenses: Not on file  Food Insecurity:   . Worried About Charity fundraiser in the Last Year: Not on file  . Ran Out of Food in the Last Year: Not on file  Transportation Needs:   . Lack of Transportation (Medical): Not on file  . Lack of Transportation (Non-Medical): Not on file  Physical Activity:   . Days of Exercise per Week: Not on file  . Minutes of Exercise per Session: Not on file  Stress:   . Feeling of Stress : Not on file  Social Connections:   . Frequency of Communication with Friends and Family: Not on file  . Frequency of Social Gatherings with Friends and Family: Not on file  . Attends Religious Services: Not on file  . Active Member of Clubs or Organizations: Not on file  . Attends Archivist Meetings: Not on file  . Marital Status: Not on file  Intimate Partner Violence:   . Fear of Current or Ex-Partner: Not on file  . Emotionally Abused: Not on file  . Physically Abused: Not on file  . Sexually  Abused: Not on file        Objective:    BP (!) 142/69   Pulse 61   Temp (!) 97.4 F (36.3 C) (Temporal)   Ht 5' 11.5" (1.816  m)   Wt 193 lb 6.4 oz (87.7 kg)   SpO2 98%   BMI 26.60 kg/m   Wt Readings from Last 3 Encounters:  07/11/20 189 lb 9.6 oz (86 kg)  06/06/20 193 lb (87.5 kg)  04/17/20 194 lb (88 kg)    Physical Exam Vitals reviewed.  Constitutional:      General: He is not in acute distress.    Appearance: Normal appearance. He is overweight. He is not ill-appearing, toxic-appearing or diaphoretic.  HENT:     Head: Normocephalic and atraumatic.  Eyes:     General: No scleral icterus.       Right eye: No discharge.        Left eye: No discharge.     Conjunctiva/sclera: Conjunctivae normal.  Cardiovascular:     Rate and Rhythm: Normal rate and regular rhythm.     Heart sounds: Normal heart sounds. No murmur heard.  No friction rub. No gallop.   Pulmonary:     Effort: Pulmonary effort is normal. No respiratory distress.     Breath sounds: No stridor. Rhonchi present. No wheezing or rales.  Musculoskeletal:        General: Normal range of motion.     Cervical back: Normal range of motion.  Skin:    General: Skin is warm and dry.  Neurological:     Mental Status: He is alert and oriented to person, place, and time. Mental status is at baseline.  Psychiatric:        Mood and Affect: Mood normal.        Behavior: Behavior normal.        Thought Content: Thought content normal.        Judgment: Judgment normal.    Diabetic Foot Exam - Simple   Simple Foot Form Diabetic Foot exam was performed with the following findings: Yes 09/06/2020 10:15 AM  Visual Inspection No deformities, no ulcerations, no other skin breakdown bilaterally: Yes Sensation Testing Intact to touch and monofilament testing bilaterally: Yes Pulse Check Posterior Tibialis and Dorsalis pulse intact bilaterally: Yes Comments Thick toenails.      Lab Results  Component Value Date    TSH 8.900 (H) 06/06/2020   Lab Results  Component Value Date   WBC 6.3 06/06/2020   HGB 13.6 06/06/2020   HCT 41.6 06/06/2020   MCV 96 06/06/2020   PLT 141 (L) 06/06/2020   Lab Results  Component Value Date   NA 141 06/06/2020   K 4.3 06/06/2020   CO2 27 06/06/2020   GLUCOSE 143 (H) 06/06/2020   BUN 21 06/06/2020   CREATININE 1.45 (H) 06/06/2020   BILITOT 0.7 06/06/2020   ALKPHOS 65 06/06/2020   AST 13 06/06/2020   ALT 14 06/06/2020   PROT 6.8 06/06/2020   ALBUMIN 4.3 06/06/2020   CALCIUM 9.8 06/06/2020   ANIONGAP 9 04/11/2020   Lab Results  Component Value Date   CHOL 176 06/06/2020   Lab Results  Component Value Date   HDL 33 (L) 06/06/2020   Lab Results  Component Value Date   LDLCALC 116 (H) 06/06/2020   Lab Results  Component Value Date   TRIG 149 06/06/2020   Lab Results  Component Value Date   CHOLHDL 5.3 (H) 06/06/2020   Lab Results  Component Value Date   HGBA1C 7.0 (H) 04/08/2020

## 2020-09-07 LAB — CMP14+EGFR
ALT: 12 IU/L (ref 0–44)
AST: 12 IU/L (ref 0–40)
Albumin/Globulin Ratio: 2 (ref 1.2–2.2)
Albumin: 4.3 g/dL (ref 3.6–4.6)
Alkaline Phosphatase: 69 IU/L (ref 44–121)
BUN/Creatinine Ratio: 13 (ref 10–24)
BUN: 18 mg/dL (ref 8–27)
Bilirubin Total: 1.1 mg/dL (ref 0.0–1.2)
CO2: 27 mmol/L (ref 20–29)
Calcium: 9.9 mg/dL (ref 8.6–10.2)
Chloride: 101 mmol/L (ref 96–106)
Creatinine, Ser: 1.4 mg/dL — ABNORMAL HIGH (ref 0.76–1.27)
GFR calc Af Amer: 53 mL/min/{1.73_m2} — ABNORMAL LOW (ref >59)
GFR calc non Af Amer: 45 mL/min/{1.73_m2} — ABNORMAL LOW (ref >59)
Globulin, Total: 2.2 g/dL (ref 1.5–4.5)
Glucose: 161 mg/dL — ABNORMAL HIGH (ref 65–99)
Potassium: 3.9 mmol/L (ref 3.5–5.2)
Sodium: 142 mmol/L (ref 134–144)
Total Protein: 6.5 g/dL (ref 6.0–8.5)

## 2020-09-07 LAB — THYROID PANEL WITH TSH
Free Thyroxine Index: 2.2 (ref 1.2–4.9)
T3 Uptake Ratio: 28 % (ref 24–39)
T4, Total: 7.7 ug/dL (ref 4.5–12.0)
TSH: 9.68 u[IU]/mL — ABNORMAL HIGH (ref 0.450–4.500)

## 2020-09-07 LAB — MICROALBUMIN / CREATININE URINE RATIO
Creatinine, Urine: 136.8 mg/dL
Microalb/Creat Ratio: 502 mg/g creat — ABNORMAL HIGH (ref 0–29)
Microalbumin, Urine: 687 ug/mL

## 2020-09-08 ENCOUNTER — Encounter: Payer: Self-pay | Admitting: Family Medicine

## 2020-09-08 DIAGNOSIS — E1121 Type 2 diabetes mellitus with diabetic nephropathy: Secondary | ICD-10-CM | POA: Insufficient documentation

## 2020-09-10 ENCOUNTER — Encounter: Payer: Self-pay | Admitting: Family Medicine

## 2020-09-10 DIAGNOSIS — R7989 Other specified abnormal findings of blood chemistry: Secondary | ICD-10-CM | POA: Insufficient documentation

## 2020-09-15 ENCOUNTER — Other Ambulatory Visit: Payer: Self-pay | Admitting: Family Medicine

## 2020-09-15 MED ORDER — BREZTRI AEROSPHERE 160-9-4.8 MCG/ACT IN AERO: 2.0000 | INHALATION_SPRAY | Freq: Two times a day (BID) | RESPIRATORY_TRACT | 3 refills | Status: DC

## 2020-09-15 NOTE — Addendum Note (Signed)
Addended by: Lottie Dawson D on: 09/15/2020 12:12 PM   Modules accepted: Orders

## 2020-09-15 NOTE — Progress Notes (Signed)
Patient sent home with Gary Odonnell from hospital (has been unable to get due to insurance), however therapy not optimized for COPD DX with COPD Still smokes, SOB, recent cardiac event Recommend LAMA therapy be added to regimen Will go with Gary Odonnell to be covered on insurance per Epic (preferred level 1)

## 2020-09-15 NOTE — Progress Notes (Signed)
I reviewed the recent express prescription correspondence. Apparently his insurance does not prefer Breo and prefers Advair.  He is being treated for COPD.  Unsure if the change is appropriate.  I will give paperwork to Almyra Free to further evaluate.  Would prefer to keep him on Breo over Advair if possible.  Otherwise, would pursue alternative

## 2020-09-26 DIAGNOSIS — Z6826 Body mass index (BMI) 26.0-26.9, adult: Secondary | ICD-10-CM | POA: Diagnosis not present

## 2020-09-26 DIAGNOSIS — M48062 Spinal stenosis, lumbar region with neurogenic claudication: Secondary | ICD-10-CM | POA: Diagnosis not present

## 2020-09-26 DIAGNOSIS — I10 Essential (primary) hypertension: Secondary | ICD-10-CM | POA: Diagnosis not present

## 2020-10-06 ENCOUNTER — Encounter (HOSPITAL_COMMUNITY): Payer: Self-pay

## 2020-10-06 ENCOUNTER — Inpatient Hospital Stay (HOSPITAL_COMMUNITY)
Admission: EM | Admit: 2020-10-06 | Discharge: 2020-10-08 | DRG: 291 | Disposition: A | Discharge status: Home / Self Care | Payer: Medicare Other | Attending: Internal Medicine | Admitting: Internal Medicine

## 2020-10-06 ENCOUNTER — Emergency Department (HOSPITAL_COMMUNITY): Payer: Medicare Other

## 2020-10-06 ENCOUNTER — Other Ambulatory Visit: Payer: Self-pay

## 2020-10-06 DIAGNOSIS — J9601 Acute respiratory failure with hypoxia: Secondary | ICD-10-CM | POA: Diagnosis present

## 2020-10-06 DIAGNOSIS — I5023 Acute on chronic systolic (congestive) heart failure: Secondary | ICD-10-CM

## 2020-10-06 DIAGNOSIS — Z7901 Long term (current) use of anticoagulants: Secondary | ICD-10-CM

## 2020-10-06 DIAGNOSIS — Z85118 Personal history of other malignant neoplasm of bronchus and lung: Secondary | ICD-10-CM

## 2020-10-06 DIAGNOSIS — Z20822 Contact with and (suspected) exposure to covid-19: Secondary | ICD-10-CM | POA: Diagnosis present

## 2020-10-06 DIAGNOSIS — Z79899 Other long term (current) drug therapy: Secondary | ICD-10-CM

## 2020-10-06 DIAGNOSIS — J449 Chronic obstructive pulmonary disease, unspecified: Secondary | ICD-10-CM | POA: Diagnosis present

## 2020-10-06 DIAGNOSIS — N1831 Chronic kidney disease, stage 3a: Secondary | ICD-10-CM | POA: Diagnosis present

## 2020-10-06 DIAGNOSIS — Z951 Presence of aortocoronary bypass graft: Secondary | ICD-10-CM

## 2020-10-06 DIAGNOSIS — I2510 Atherosclerotic heart disease of native coronary artery without angina pectoris: Secondary | ICD-10-CM | POA: Diagnosis not present

## 2020-10-06 DIAGNOSIS — E1151 Type 2 diabetes mellitus with diabetic peripheral angiopathy without gangrene: Secondary | ICD-10-CM | POA: Diagnosis present

## 2020-10-06 DIAGNOSIS — Z7982 Long term (current) use of aspirin: Secondary | ICD-10-CM

## 2020-10-06 DIAGNOSIS — I4821 Permanent atrial fibrillation: Secondary | ICD-10-CM | POA: Diagnosis not present

## 2020-10-06 DIAGNOSIS — I255 Ischemic cardiomyopathy: Secondary | ICD-10-CM | POA: Diagnosis present

## 2020-10-06 DIAGNOSIS — E1122 Type 2 diabetes mellitus with diabetic chronic kidney disease: Secondary | ICD-10-CM | POA: Diagnosis not present

## 2020-10-06 DIAGNOSIS — I482 Chronic atrial fibrillation, unspecified: Secondary | ICD-10-CM

## 2020-10-06 DIAGNOSIS — E785 Hyperlipidemia, unspecified: Secondary | ICD-10-CM | POA: Diagnosis present

## 2020-10-06 DIAGNOSIS — I130 Hypertensive heart and chronic kidney disease with heart failure and stage 1 through stage 4 chronic kidney disease, or unspecified chronic kidney disease: Principal | ICD-10-CM | POA: Diagnosis present

## 2020-10-06 DIAGNOSIS — I4891 Unspecified atrial fibrillation: Secondary | ICD-10-CM | POA: Diagnosis present

## 2020-10-06 DIAGNOSIS — E119 Type 2 diabetes mellitus without complications: Secondary | ICD-10-CM

## 2020-10-06 DIAGNOSIS — F17290 Nicotine dependence, other tobacco product, uncomplicated: Secondary | ICD-10-CM | POA: Diagnosis present

## 2020-10-06 DIAGNOSIS — R0902 Hypoxemia: Secondary | ICD-10-CM

## 2020-10-06 DIAGNOSIS — R0602 Shortness of breath: Secondary | ICD-10-CM | POA: Diagnosis not present

## 2020-10-06 DIAGNOSIS — I509 Heart failure, unspecified: Secondary | ICD-10-CM

## 2020-10-06 DIAGNOSIS — Z66 Do not resuscitate: Secondary | ICD-10-CM | POA: Diagnosis present

## 2020-10-06 DIAGNOSIS — I5043 Acute on chronic combined systolic (congestive) and diastolic (congestive) heart failure: Secondary | ICD-10-CM | POA: Diagnosis not present

## 2020-10-06 DIAGNOSIS — J90 Pleural effusion, not elsewhere classified: Secondary | ICD-10-CM | POA: Diagnosis not present

## 2020-10-06 DIAGNOSIS — I10 Essential (primary) hypertension: Secondary | ICD-10-CM | POA: Diagnosis present

## 2020-10-06 DIAGNOSIS — Z8673 Personal history of transient ischemic attack (TIA), and cerebral infarction without residual deficits: Secondary | ICD-10-CM

## 2020-10-06 DIAGNOSIS — Z720 Tobacco use: Secondary | ICD-10-CM | POA: Diagnosis present

## 2020-10-06 DIAGNOSIS — I517 Cardiomegaly: Secondary | ICD-10-CM | POA: Diagnosis not present

## 2020-10-06 DIAGNOSIS — I252 Old myocardial infarction: Secondary | ICD-10-CM

## 2020-10-06 DIAGNOSIS — I110 Hypertensive heart disease with heart failure: Secondary | ICD-10-CM | POA: Diagnosis not present

## 2020-10-06 DIAGNOSIS — N183 Chronic kidney disease, stage 3 unspecified: Secondary | ICD-10-CM | POA: Diagnosis present

## 2020-10-06 HISTORY — DX: Type 2 diabetes mellitus without complications: E11.9

## 2020-10-06 HISTORY — DX: Hyperlipidemia, unspecified: E78.5

## 2020-10-06 HISTORY — DX: Essential (primary) hypertension: I10

## 2020-10-06 LAB — CBC WITH DIFFERENTIAL/PLATELET
Abs Immature Granulocytes: 0.05 10*3/uL (ref 0.00–0.07)
Basophils Absolute: 0 10*3/uL (ref 0.0–0.1)
Basophils Relative: 0 %
Eosinophils Absolute: 0.3 10*3/uL (ref 0.0–0.5)
Eosinophils Relative: 4 %
HCT: 41 % (ref 39.0–52.0)
Hemoglobin: 12.6 g/dL — ABNORMAL LOW (ref 13.0–17.0)
Immature Granulocytes: 1 %
Lymphocytes Relative: 15 %
Lymphs Abs: 1 10*3/uL (ref 0.7–4.0)
MCH: 30.8 pg (ref 26.0–34.0)
MCHC: 30.7 g/dL (ref 30.0–36.0)
MCV: 100.2 fL — ABNORMAL HIGH (ref 80.0–100.0)
Monocytes Absolute: 0.4 10*3/uL (ref 0.1–1.0)
Monocytes Relative: 6 %
Neutro Abs: 5.2 10*3/uL (ref 1.7–7.7)
Neutrophils Relative %: 74 %
Platelets: 156 10*3/uL (ref 150–400)
RBC: 4.09 MIL/uL — ABNORMAL LOW (ref 4.22–5.81)
RDW: 13.8 % (ref 11.5–15.5)
WBC: 6.9 10*3/uL (ref 4.0–10.5)
nRBC: 0 % (ref 0.0–0.2)

## 2020-10-06 LAB — RESPIRATORY PANEL BY RT PCR (FLU A&B, COVID)
Influenza A by PCR: NEGATIVE
Influenza B by PCR: NEGATIVE
SARS Coronavirus 2 by RT PCR: NEGATIVE

## 2020-10-06 LAB — COMPREHENSIVE METABOLIC PANEL
ALT: 14 U/L (ref 0–44)
AST: 11 U/L — ABNORMAL LOW (ref 15–41)
Albumin: 3.7 g/dL (ref 3.5–5.0)
Alkaline Phosphatase: 62 U/L (ref 38–126)
Anion gap: 9 (ref 5–15)
BUN: 16 mg/dL (ref 8–23)
CO2: 32 mmol/L (ref 22–32)
Calcium: 9.6 mg/dL (ref 8.9–10.3)
Chloride: 102 mmol/L (ref 98–111)
Creatinine, Ser: 1.33 mg/dL — ABNORMAL HIGH (ref 0.61–1.24)
GFR, Estimated: 52 mL/min — ABNORMAL LOW (ref >60)
Glucose, Bld: 200 mg/dL — ABNORMAL HIGH (ref 70–99)
Potassium: 3.7 mmol/L (ref 3.5–5.1)
Sodium: 143 mmol/L (ref 135–145)
Total Bilirubin: 2.1 mg/dL — ABNORMAL HIGH (ref 0.3–1.2)
Total Protein: 7 g/dL (ref 6.5–8.1)

## 2020-10-06 LAB — URINALYSIS, ROUTINE W REFLEX MICROSCOPIC
Bacteria, UA: NONE SEEN
Bilirubin Urine: NEGATIVE
Glucose, UA: 50 mg/dL — AB
Ketones, ur: NEGATIVE mg/dL
Nitrite: NEGATIVE
Protein, ur: 100 mg/dL — AB
Specific Gravity, Urine: 1.019 (ref 1.005–1.030)
pH: 6 (ref 5.0–8.0)

## 2020-10-06 LAB — TROPONIN I (HIGH SENSITIVITY)
Troponin I (High Sensitivity): 22 ng/L — ABNORMAL HIGH (ref <18)
Troponin I (High Sensitivity): 23 ng/L — ABNORMAL HIGH (ref <18)

## 2020-10-06 LAB — BRAIN NATRIURETIC PEPTIDE: B Natriuretic Peptide: 1161 pg/mL — ABNORMAL HIGH (ref 0.0–100.0)

## 2020-10-06 LAB — GLUCOSE, CAPILLARY: Glucose-Capillary: 158 mg/dL — ABNORMAL HIGH (ref 70–99)

## 2020-10-06 MED ORDER — ASPIRIN EC 81 MG PO TBEC
81.0000 mg | DELAYED_RELEASE_TABLET | Freq: Every day | ORAL | Status: DC
  Administered 2020-10-07 – 2020-10-08 (×2): 81 mg via ORAL
  Filled 2020-10-06 (×2): qty 1

## 2020-10-06 MED ORDER — ONDANSETRON HCL 4 MG/2ML IJ SOLN: 4.0000 mg | Freq: Four times a day (QID) | INTRAMUSCULAR | Status: DC | PRN

## 2020-10-06 MED ORDER — FUROSEMIDE 10 MG/ML IJ SOLN
40.0000 mg | Freq: Once | INTRAMUSCULAR | Status: AC
  Administered 2020-10-06: 40 mg via INTRAVENOUS
  Filled 2020-10-06: qty 4

## 2020-10-06 MED ORDER — EZETIMIBE 10 MG PO TABS
10.0000 mg | ORAL_TABLET | Freq: Every day | ORAL | Status: DC
  Administered 2020-10-07 – 2020-10-08 (×2): 10 mg via ORAL
  Filled 2020-10-06 (×5): qty 1

## 2020-10-06 MED ORDER — ISOSORBIDE MONONITRATE ER 60 MG PO TB24
30.0000 mg | ORAL_TABLET | Freq: Every day | ORAL | Status: DC
  Administered 2020-10-06 – 2020-10-08 (×3): 30 mg via ORAL
  Filled 2020-10-06 (×6): qty 1

## 2020-10-06 MED ORDER — ALBUTEROL SULFATE (2.5 MG/3ML) 0.083% IN NEBU: 2.5000 mg | INHALATION_SOLUTION | RESPIRATORY_TRACT | Status: DC | PRN

## 2020-10-06 MED ORDER — ONDANSETRON HCL 4 MG PO TABS: 4.0000 mg | ORAL_TABLET | Freq: Four times a day (QID) | ORAL | Status: DC | PRN

## 2020-10-06 MED ORDER — FUROSEMIDE 10 MG/ML IJ SOLN
40.0000 mg | Freq: Two times a day (BID) | INTRAMUSCULAR | Status: DC
  Administered 2020-10-06 – 2020-10-08 (×4): 40 mg via INTRAVENOUS
  Filled 2020-10-06 (×4): qty 4

## 2020-10-06 MED ORDER — CARVEDILOL 3.125 MG PO TABS
6.2500 mg | ORAL_TABLET | Freq: Two times a day (BID) | ORAL | Status: DC
  Administered 2020-10-06 – 2020-10-08 (×4): 6.25 mg via ORAL
  Filled 2020-10-06 (×4): qty 2

## 2020-10-06 MED ORDER — HYDRALAZINE HCL 25 MG PO TABS
37.5000 mg | ORAL_TABLET | Freq: Three times a day (TID) | ORAL | Status: DC
  Administered 2020-10-06 – 2020-10-08 (×6): 37.5 mg via ORAL
  Filled 2020-10-06 (×6): qty 2

## 2020-10-06 MED ORDER — INSULIN ASPART 100 UNIT/ML ~~LOC~~ SOLN
0.0000 [IU] | Freq: Three times a day (TID) | SUBCUTANEOUS | Status: DC
  Administered 2020-10-07: 5 [IU] via SUBCUTANEOUS
  Administered 2020-10-07: 3 [IU] via SUBCUTANEOUS
  Administered 2020-10-07 – 2020-10-08 (×2): 2 [IU] via SUBCUTANEOUS
  Administered 2020-10-08: 3 [IU] via SUBCUTANEOUS

## 2020-10-06 MED ORDER — SODIUM CHLORIDE 0.9 % IV SOLN
1.0000 g | INTRAVENOUS | Status: DC
  Administered 2020-10-06 – 2020-10-07 (×2): 1 g via INTRAVENOUS
  Filled 2020-10-06 (×2): qty 10

## 2020-10-06 MED ORDER — ACETAMINOPHEN 650 MG RE SUPP: 650.0000 mg | Freq: Four times a day (QID) | RECTAL | Status: DC | PRN

## 2020-10-06 MED ORDER — INSULIN ASPART 100 UNIT/ML ~~LOC~~ SOLN: 0.0000 [IU] | Freq: Every day | SUBCUTANEOUS | Status: DC

## 2020-10-06 MED ORDER — IPRATROPIUM-ALBUTEROL 0.5-2.5 (3) MG/3ML IN SOLN
3.0000 mL | Freq: Four times a day (QID) | RESPIRATORY_TRACT | Status: DC
  Administered 2020-10-06 – 2020-10-07 (×5): 3 mL via RESPIRATORY_TRACT
  Filled 2020-10-06 (×4): qty 3

## 2020-10-06 MED ORDER — APIXABAN 5 MG PO TABS
5.0000 mg | ORAL_TABLET | Freq: Two times a day (BID) | ORAL | Status: DC
  Administered 2020-10-06 – 2020-10-08 (×4): 5 mg via ORAL
  Filled 2020-10-06 (×4): qty 1

## 2020-10-06 MED ORDER — ACETAMINOPHEN 325 MG PO TABS: 650.0000 mg | ORAL_TABLET | Freq: Four times a day (QID) | ORAL | Status: DC | PRN

## 2020-10-06 MED ORDER — GUAIFENESIN ER 600 MG PO TB12
600.0000 mg | ORAL_TABLET | Freq: Two times a day (BID) | ORAL | Status: DC
  Administered 2020-10-06 – 2020-10-08 (×4): 600 mg via ORAL
  Filled 2020-10-06 (×4): qty 1

## 2020-10-06 MED ORDER — BUDESONIDE 0.25 MG/2ML IN SUSP
0.2500 mg | Freq: Two times a day (BID) | RESPIRATORY_TRACT | Status: DC
  Administered 2020-10-06 – 2020-10-08 (×4): 0.25 mg via RESPIRATORY_TRACT
  Filled 2020-10-06 (×4): qty 2

## 2020-10-06 MED ORDER — PREDNISONE 20 MG PO TABS
40.0000 mg | ORAL_TABLET | Freq: Every day | ORAL | Status: DC
  Administered 2020-10-07 – 2020-10-08 (×2): 40 mg via ORAL
  Filled 2020-10-06 (×2): qty 2

## 2020-10-06 MED ORDER — SACUBITRIL-VALSARTAN 49-51 MG PO TABS
1.0000 | ORAL_TABLET | Freq: Two times a day (BID) | ORAL | Status: DC
  Administered 2020-10-06 – 2020-10-08 (×4): 1 via ORAL
  Filled 2020-10-06 (×10): qty 1

## 2020-10-06 NOTE — ED Notes (Signed)
ED TO INPATIENT HANDOFF REPORT  ED Nurse Name and Phone #: Annie Saephan   S Name/Age/Gender Gary Odonnell 86 y.o. male Room/Bed: APA18/APA18  Code Status   Code Status: DNR  Home/SNF/Other Home Patient oriented to: self, place, time and situation Is this baseline? Yes   Triage Complete: Triage complete  Chief Complaint CHF exacerbation ([place]) [I50.9]  Triage Note Pt presents to ED with complaints of increased SOB started this am, denies fever. + productive cough    Allergies Allergies  Allergen Reactions  . Statins Other (See Comments)    Intolerance per outpt Cardiology notes    Level of Care/Admitting Diagnosis ED Disposition    ED Disposition Condition [place]: [facility] [[id number]]  Level of Care: Telemetry [5]  Covid Evaluation: Confirmed COVID Negative  Diagnosis: CHF exacerbation [facility]) [[id number]]  Admitting Physician: [place], [place]  Attending Physician: Kathie Dike [[id number]]       B Medical/Surgery History Past Medical History:  Diagnosis Date  . Atrial fibrillation ([place])   . CHF (congestive heart failure) ([place])    a. EF 40-45% in 2015, at EF 30-35% by echo in 12/2018  . COPD (chronic obstructive pulmonary disease) ([place])   . Coronary artery disease    a. s/p CABG with most recent cath in 2014 showing patent LIMA-LAD, occluded Radial to OM1-OM2-PDA and occluded RCA with L--> R collaterals  . Diabetic nephropathy ([place])   . Essential hypertension   . Hyperlipidemia   . PAD (peripheral artery disease) ([place])   . Pneumonia 09/2013  . Primary cancer of left lower lobe of lung ([place]) 02/02/2015  . Radiation 02/2015  . Spinal stenosis   . Stroke ([place]) 11/2008  . Type 2 diabetes mellitus ([place])    Past Surgical History:  Procedure Laterality Date  . Angioplasty to Left femoral artery    . CARPAL TUNNEL RELEASE Left 05/11/2014   Procedure: LEFT CARPAL TUNNEL RELEASE;  Surgeon: Carole Civil, MD;  Location: [facility] ORS;   Service: Orthopedics;  Laterality: Left;  . COLONOSCOPY N/A 01/24/2015   Procedure: COLONOSCOPY;  Surgeon: Danie Binder, MD;  Location: [facility] ENDO SUITE;  Service: Endoscopy;  Laterality: N/A;  130  . CORONARY ARTERY BYPASS GRAFT     x4  . FEMORAL-POPLITEAL BYPASS GRAFT Right 10/23/2015   Procedure: BYPASS GRAFT FEMORAL TO BELOW KNEE POPLITEAL ARTERY USING RIGHT NON-REVERSED GREATER SAPPHENOUS VEIN;  Surgeon: [name], MD;  Location: [place];  Service: Vascular;  Laterality: Right;  . INTRAOPERATIVE ARTERIOGRAM Right 10/23/2015   Procedure: INTRA OPERATIVE ARTERIOGRAM - RIGHT LOWER LEG;  Surgeon: [name], MD;  Location: [place];  Service: Vascular;  Laterality: Right;  . KNEE ARTHROSCOPY Right   . LEFT AND RIGHT HEART CATHETERIZATION WITH CORONARY ANGIOGRAM N/A 09/29/2013   Procedure: LEFT AND RIGHT HEART CATHETERIZATION WITH CORONARY ANGIOGRAM;  Surgeon: Burnell Blanks, MD;  Location: [facility] CATH LAB;  Service: Cardiovascular;  Laterality: N/A;  . LEFT HEART CATH AND CORS/GRAFTS ANGIOGRAPHY N/A 04/11/2020   Procedure: LEFT HEART CATH AND CORS/GRAFTS ANGIOGRAPHY;  Surgeon: Burnell Blanks, MD;  Location: [place] CV LAB;  Service: Cardiovascular;  Laterality: N/A;  . PERIPHERAL VASCULAR CATHETERIZATION N/A 04/21/2015   Procedure: Abdominal Aortogram w/Lower Extremity;  Surgeon: [name], MD;  Location: [facility] CV LAB;  Service: Cardiovascular;  Laterality: N/A;  . Stent to right femoral artery    . TRIGGER FINGER RELEASE Bilateral   . VEIN HARVEST Right 10/23/2015  Procedure: VEIN HARVEST - RIGHT GREATER SAPPHENOUS;  Surgeon: [name], MD;  Location: [facility] OR;  Service: Vascular;  Laterality: Right;     A IV Location/Drains/Wounds Patient Lines/Drains/Airways Status    Active Line/Drains/Airways    Name Placement date Placement time Site Days   Peripheral IV 10/06/20 Right;Lateral Forearm 10/06/20  1155  Forearm  less than 1   Pressure Injury  04/08/20 Coccyx Stage 1 -  Intact skin with non-blanchable redness of a localized area usually over a bony prominence. 04/08/20  2015   181          Intake/Output Last 24 hours No intake or output data in the 24 hours ending 10/06/20 1811  Labs/Imaging Results for orders placed or performed during the hospital encounter of 10/06/20 (from the past 48 hour(s))  Comprehensive metabolic panel     Status: Abnormal   Collection Time: 10/06/20  9:27 AM  Result Value Ref Range   Sodium 143 135 - 145 mmol/L   Potassium 3.7 3.5 - 5.1 mmol/L   Chloride 102 98 - 111 mmol/L   CO2 32 22 - 32 mmol/L   Glucose, Bld 200 (H) 70 - 99 mg/dL    Comment: Glucose reference range applies only to samples taken after fasting for at least 8 hours.   BUN 16 8 - 23 mg/dL   Creatinine, Ser 1.33 (H) 0.61 - 1.24 mg/dL   Calcium 9.6 8.9 - 10.3 mg/dL   Total Protein 7.0 6.5 - 8.1 g/dL   Albumin 3.7 3.5 - 5.0 g/dL   AST 11 (L) 15 - 41 U/L   ALT 14 0 - 44 U/L   Alkaline Phosphatase 62 38 - 126 U/L   Total Bilirubin 2.1 (H) 0.3 - 1.2 mg/dL   GFR, Estimated 52 (L) >60 mL/min    Comment: (NOTE) Calculated using the CKD-EPI Creatinine Equation (2021)    Anion gap 9 5 - 15    Comment: Performed at [facility], [address] [place], [place] [address]  CBC with Differential     Status: Abnormal   Collection Time: 10/06/20  9:27 AM  Result Value Ref Range   WBC 6.9 4.0 - 10.5 K/uL   RBC 4.09 (L) 4.22 - 5.81 MIL/uL   Hemoglobin 12.6 (L) 13.0 - 17.0 g/dL   HCT 41.0 39 - 52 %   MCV 100.2 (H) 80.0 - 100.0 fL   MCH 30.8 26.0 - 34.0 pg   MCHC 30.7 30.0 - 36.0 g/dL   RDW 13.8 11.5 - 15.5 %   Platelets 156 150 - 400 K/uL   nRBC 0.0 0.0 - 0.2 %   Neutrophils Relative % 74 %   Neutro Abs 5.2 1.7 - 7.7 K/uL   Lymphocytes Relative 15 %   Lymphs Abs 1.0 0.7 - 4.0 K/uL   Monocytes Relative 6 %   Monocytes Absolute 0.4 0.1 - 1.0 K/uL   Eosinophils Relative 4 %   Eosinophils Absolute 0.3 0.0 - 0.5 K/uL   Basophils  Relative 0 %   Basophils Absolute 0.0 0.0 - 0.1 K/uL   Immature Granulocytes 1 %   Abs Immature Granulocytes 0.05 0.00 - 0.07 K/uL    Comment: Performed at [facility], [address] [place], [place] [address]  Brain natriuretic peptide     Status: Abnormal   Collection Time: 10/06/20  9:27 AM  Result Value Ref Range   B Natriuretic Peptide 1,161.0 (H) 0.0 - 100.0 pg/mL    Comment: Performed at  [facility], [address] [place], [place] [address]  Respiratory Panel by RT PCR (Flu A&B, Covid) - Nasopharyngeal Swab     Status: None   Collection Time: 10/06/20 10:07 AM   Specimen: Nasopharyngeal Swab  Result Value Ref Range   SARS Coronavirus 2 by RT PCR NEGATIVE NEGATIVE    Comment: (NOTE) SARS-CoV-2 target nucleic acids are NOT DETECTED.  The SARS-CoV-2 RNA is generally detectable in upper respiratoy specimens during the acute phase of infection. The lowest concentration of SARS-CoV-2 viral copies this assay can detect is 131 copies/mL. A negative result does not preclude SARS-Cov-2 infection and should not be used as the sole basis for treatment or other patient management decisions. A negative result may occur with  improper specimen collection/handling, submission of specimen other than nasopharyngeal swab, presence of viral mutation(s) within the areas targeted by this assay, and inadequate number of viral copies (<131 copies/mL). A negative result must be combined with clinical observations, patient history, and epidemiological information. The expected result is Negative.  Fact Sheet for Patients:  PinkCheek.be  Fact Sheet for Healthcare Providers:  GravelBags.it  This test is no t yet approved or cleared by the [place] FDA and  has been authorized for detection and/or diagnosis of SARS-CoV-2 by FDA under an Emergency Use Authorization (EUA). This EUA will remain  in effect (meaning this test can be used)  for the duration of the COVID-19 declaration under Section 564(b)(1) of the Act, 21 U.S.C. section 360bbb-3(b)(1), unless the authorization is terminated or revoked sooner.     Influenza A by PCR NEGATIVE NEGATIVE   Influenza B by PCR NEGATIVE NEGATIVE    Comment: (NOTE) The Xpert Xpress SARS-CoV-2/FLU/RSV assay is intended as an aid in  the diagnosis of influenza from Nasopharyngeal swab specimens and  should not be used as a sole basis for treatment. Nasal washings and  aspirates are unacceptable for Xpert Xpress SARS-CoV-2/FLU/RSV  testing.  Fact Sheet for Patients: PinkCheek.be  Fact Sheet for Healthcare Providers: GravelBags.it  This test is not yet approved or cleared by the [place] FDA and  has been authorized for detection and/or diagnosis of SARS-CoV-2 by  FDA under an Emergency Use Authorization (EUA). This EUA will remain  in effect (meaning this test can be used) for the duration of the  Covid-19 declaration under Section 564(b)(1) of the Act, 21  U.S.C. section 360bbb-3(b)(1), unless the authorization is  terminated or revoked. Performed at [facility], [address] [place], [place] [address]   Urinalysis, Routine w reflex microscopic Urine, Random     Status: Abnormal   Collection Time: 10/06/20 10:27 AM  Result Value Ref Range   Color, Urine YELLOW YELLOW   APPearance CLEAR CLEAR   Specific Gravity, Urine 1.019 1.005 - 1.030   pH 6.0 5.0 - 8.0   Glucose, UA 50 (A) NEGATIVE mg/dL   Hgb urine dipstick SMALL (A) NEGATIVE   Bilirubin Urine NEGATIVE NEGATIVE   Ketones, ur NEGATIVE NEGATIVE mg/dL   Protein, ur 100 (A) NEGATIVE mg/dL   Nitrite NEGATIVE NEGATIVE   Leukocytes,Ua MODERATE (A) NEGATIVE   RBC / HPF 11-20 0 - 5 RBC/hpf   WBC, UA 21-50 0 - 5 WBC/hpf   Bacteria, UA NONE SEEN NONE SEEN   Squamous Epithelial / LPF 0-5 0 - 5   Mucus PRESENT    Hyaline Casts, UA PRESENT    Ca Oxalate Crys,  UA PRESENT     Comment: Performed at [facility], [address], [place], [place] [address]  Troponin I (High Sensitivity)     Status: Abnormal   Collection Time: 10/06/20  2:35 PM  Result Value Ref Range   Troponin I (High Sensitivity) 22 (H) <18 ng/L    Comment: (NOTE) Elevated high sensitivity troponin I (hsTnI) values and significant  changes across serial measurements may suggest ACS but many other  chronic and acute conditions are known to elevate hsTnI results.  Refer to the "Links" section for chest pain algorithms and additional  guidance. Performed at [facility], [address], [place], [place] [address]    DG Chest Port 1 View  Result Date: 10/06/2020 CLINICAL DATA:  Shortness of breath EXAM: PORTABLE CHEST 1 VIEW COMPARISON:  01/15/2019 chest radiograph and prior. FINDINGS: Patchy bibasilar opacities. Small left predominant pleural effusions. No pneumothorax. Cardiomegaly. Post sternotomy sequela. Multilevel spondylosis. IMPRESSION: Patchy basilar opacities and small effusions, less conspicuous than prior exam. Cardiomegaly. Electronically Signed   By: Primitivo Gauze M.D.   On: 10/06/2020 09:28    Pending Labs Unresulted Labs (From admission, onward)          Start     Ordered   10/07/20 0500  Comprehensive metabolic panel  Tomorrow morning,   R        10/06/20 1737   10/07/20 0500  CBC  Tomorrow morning,   R        10/06/20 1737   10/06/20 1737  Hemoglobin A1c  Once,   STAT       Comments: To assess prior glycemic control    10/06/20 1737          Vitals/Pain Today's Vitals   10/06/20 1630 10/06/20 1700 10/06/20 1730 10/06/20 1800  BP: (!) 174/71 (!) 161/62 127/64 138/64  Pulse: (!) 51 63 60 70  Resp: 19 20 17 19   Temp:      TempSrc:      SpO2: 97% 98% 98% 99%  Weight:      Height:      PainSc:        Isolation Precautions No active isolations  Medications Medications  isosorbide mononitrate (IMDUR) 24 hr tablet 30 mg (has no  administration in time range)  aspirin EC tablet 81 mg (has no administration in time range)  carvedilol (COREG) tablet 6.25 mg (has no administration in time range)  ezetimibe (ZETIA) tablet 10 mg (has no administration in time range)  hydrALAZINE (APRESOLINE) tablet 37.5 mg (has no administration in time range)  sacubitril-valsartan (ENTRESTO) 49-51 mg per tablet (has no administration in time range)  apixaban (ELIQUIS) tablet 5 mg (has no administration in time range)  budesonide (PULMICORT) nebulizer solution 0.25 mg (has no administration in time range)  cefTRIAXone (ROCEPHIN) 1 g in sodium chloride 0.9 % 100 mL IVPB (has no administration in time range)  ipratropium-albuterol (DUONEB) 0.5-2.5 (3) MG/3ML nebulizer solution 3 mL (has no administration in time range)  predniSONE (DELTASONE) tablet 40 mg (has no administration in time range)  albuterol (PROVENTIL) (2.5 MG/3ML) 0.083% nebulizer solution 2.5 mg (has no administration in time range)  guaiFENesin (MUCINEX) 12 hr tablet 600 mg (has no administration in time range)  furosemide (LASIX) injection 40 mg (has no administration in time range)  acetaminophen (TYLENOL) tablet 650 mg (has no administration in time range)    Or  acetaminophen (TYLENOL) suppository 650 mg (has no administration in time range)  ondansetron (ZOFRAN) tablet 4 mg (has no administration in time range)    Or  ondansetron (ZOFRAN)  injection 4 mg (has no administration in time range)  insulin aspart (novoLOG) injection 0-5 Units (has no administration in time range)  insulin aspart (novoLOG) injection 0-15 Units (has no administration in time range)  furosemide (LASIX) injection 40 mg (40 mg Intravenous Given 10/06/20 1215)    Mobility walks Low fall risk   Focused Assessments    R Recommendations: See Admitting Provider Note  Report given to:   Additional Notes:

## 2020-10-06 NOTE — Consult Note (Signed)
Cardiology Consultation:   Patient ID: Gary Odonnell; [id number]; 28-Sep-1932   Admit date: 10/06/2020 Date of Consult: 10/06/2020  Primary Care Provider: Loman Brooklyn, FNP Primary Cardiologist: Carlyle Dolly, MD Primary Electrophysiologist: None   Patient Profile:   Gary Odonnell is an 86 y.o. male with a history of CAD status post CABG with graft disease, ischemic cardiomyopathy, hypertension, hyperlipidemia, PAD, type 2 diabetes mellitus, and COPD who is being seen today for the evaluation of shortness of breath at the request of Dr. Roderic Palau.  History of Present Illness:   [name] presents to the [facility] stating that he woke up short of breath this morning, no definite chest tightness, but has also had associated chest congestion and cough recently with mucus production.  He denies any fevers or chills.  He states that he has been taking his medications regularly.  He does not weigh himself daily, but states that he has not noticed any substantial weight gain when he does check periodically.  Reports no progressive leg swelling on current diuretic regimen.  Initial work-up in the ER finds BNP of 1161, high-sensitivity troponin I 22, creatinine 1.33, SARS coronavirus 2 test negative.  Chest x-ray reports cardiomegaly with patchy basilar opacities and small effusions, reportedly less significant than imaging last February.  Past Medical History:  Diagnosis Date  . Atrial fibrillation ([place])   . CHF (congestive heart failure) ([place])    a. EF 40-45% in 2015, at EF 30-35% by echo in 12/2018  . COPD (chronic obstructive pulmonary disease) ([place])   . Coronary artery disease    a. s/p CABG with most recent cath in 2014 showing patent LIMA-LAD, occluded Radial to OM1-OM2-PDA and occluded RCA with L--> R collaterals  . Diabetic nephropathy ([place])   . Essential hypertension   . Hyperlipidemia   . PAD (peripheral artery disease) ([place])   . Pneumonia 09/2013  . Primary  cancer of left lower lobe of lung ([place]) 02/02/2015  . Radiation 02/2015  . Spinal stenosis   . Stroke ([place]) 11/2008  . Type 2 diabetes mellitus ([place])     Past Surgical History:  Procedure Laterality Date  . Angioplasty to Left femoral artery    . CARPAL TUNNEL RELEASE Left 05/11/2014   Procedure: LEFT CARPAL TUNNEL RELEASE;  Surgeon: Carole Civil, MD;  Location: [facility];  Service: Orthopedics;  Laterality: Left;  . COLONOSCOPY N/A 01/24/2015   Procedure: COLONOSCOPY;  Surgeon: Danie Binder, MD;  Location: [facility];  Service: Endoscopy;  Laterality: N/A;  130  . CORONARY ARTERY BYPASS GRAFT     x4  . FEMORAL-POPLITEAL BYPASS GRAFT Right 10/23/2015   Procedure: BYPASS GRAFT FEMORAL TO BELOW KNEE POPLITEAL ARTERY USING RIGHT NON-REVERSED GREATER SAPPHENOUS VEIN;  Surgeon: [name], MD;  Location: [facility];  Service: Vascular;  Laterality: Right;  . INTRAOPERATIVE ARTERIOGRAM Right 10/23/2015   Procedure: INTRA OPERATIVE ARTERIOGRAM - RIGHT LOWER LEG;  Surgeon: [name], MD;  Location: [facility];  Service: Vascular;  Laterality: Right;  . KNEE ARTHROSCOPY Right   . LEFT AND RIGHT HEART CATHETERIZATION WITH CORONARY ANGIOGRAM N/A 09/29/2013   Procedure: LEFT AND RIGHT HEART CATHETERIZATION WITH CORONARY ANGIOGRAM;  Surgeon: Burnell Blanks, MD;  Location: [facility] CATH LAB;  Service: Cardiovascular;  Laterality: N/A;  . LEFT HEART CATH AND CORS/GRAFTS ANGIOGRAPHY N/A 04/11/2020   Procedure: LEFT HEART CATH AND CORS/GRAFTS ANGIOGRAPHY;  Surgeon: Burnell Blanks, MD;  Location: [facility] CV LAB;  Service:  Cardiovascular;  Laterality: N/A;  . PERIPHERAL VASCULAR CATHETERIZATION N/A 04/21/2015   Procedure: Abdominal Aortogram w/Lower Extremity;  Surgeon: [name], MD;  Location: [facility] CV LAB;  Service: Cardiovascular;  Laterality: N/A;  . Stent to right femoral artery    . TRIGGER FINGER RELEASE Bilateral   . VEIN HARVEST Right 10/23/2015   Procedure: [place];  Surgeon: [name], MD;  Location: [facility];  Service: Vascular;  Laterality: Right;     Outpatient Medications: No current facility-administered medications on file prior to encounter.   Current Outpatient Medications on File Prior to Encounter  Medication Sig Dispense Refill  . albuterol (VENTOLIN HFA) 108 (90 Base) MCG/ACT inhaler Inhale 2 puffs into the lungs every 6 (six) hours as needed for wheezing or shortness of breath. 8 g 0  . apixaban (ELIQUIS) 5 MG TABS tablet Take 1 tablet (5 mg total) by mouth 2 (two) times daily. 180 tablet 1  . aspirin 81 MG EC tablet Take 1 tablet (81 mg total) by mouth daily. 30 tablet 0  . Budeson-Glycopyrrol-Formoterol (BREZTRI AEROSPHERE) 160-9-4.8 MCG/ACT AERO Inhale 2 puffs into the lungs in the morning and at bedtime. 10.7 g 3  . Carboxymethylcellulose Sodium (EYE DROPS OP) Apply 1 drop to eye daily as needed (dry eyes).     . carvedilol (COREG) 6.25 MG tablet Take 1 tablet (6.25 mg total) by mouth 2 (two) times daily with a meal. 180 tablet 1  . ezetimibe (ZETIA) 10 MG tablet Take 1 tablet (10 mg total) by mouth daily. 90 tablet 1  . furosemide (LASIX) 40 MG tablet Take one tab (40mg ) by mouth every other day alternating with 1 1/2 tabs (60mg ) every other day. 120 tablet 1  . hydrALAZINE (APRESOLINE) 25 MG tablet Take 1.5 tablets (37.5 mg total) by mouth every 8 (eight) hours. 405 tablet 1  . Potassium 99 MG TABS Take 1 tablet by mouth daily.     . sacubitril-valsartan (ENTRESTO) 49-51 MG Take 1 tablet by mouth 2 (two) times daily. 180 tablet 1  . dapagliflozin propanediol (FARXIGA) 10 MG TABS tablet Take 1 tablet (10 mg total) by mouth daily before breakfast. (Patient not taking: Reported on 10/06/2020) 90 tablet 1  . nitroGLYCERIN (NITROSTAT) 0.3 MG SL tablet Place 1 tablet (0.3 mg total) under the tongue every 5 (five) minutes as needed for chest pain. 100 tablet 3    Allergies:    Allergies  Allergen  Reactions  . Statins Other (See Comments)    Intolerance per outpt Cardiology notes    Social History:   Social History   Tobacco Use  . Smoking status: Current Every Day Smoker    Packs/day: 0.50    Years: 60.00    Pack years: 30.00    Types: Cigars    Start date: 11/21/1949  . Smokeless tobacco: Never Used  . Tobacco comment: 1-2 cigars per day   Substance Use Topics  . Alcohol use: No    Alcohol/week: 0.0 standard drinks    Family History:   The patient's family history includes Diabetes in his brother and mother; Heart Problems in his brother; Heart attack in his father; Hyperlipidemia in his father. There is no history of Colon cancer.  ROS:  Please see the history of present illness.  All other ROS reviewed and negative.     Physical Exam/Data:   Vitals:   10/06/20 1430 10/06/20 1500 10/06/20 1530 10/06/20 1600  BP: (!) 188/70 Marland Kitchen)  171/64 (!) 185/77 (!) 157/64  Pulse: (!) 59 68 78 (!) 44  Resp: 16 (!) 22 (!) 26 (!) 21  Temp:      TempSrc:      SpO2: 100% 98% 98% 98%  Weight:      Height:       No intake or output data in the 24 hours ending 10/06/20 1630 Filed Weights   10/06/20 0816  Weight: 87.5 kg   Body mass index is 26.54 kg/m.   Gen: Elderly male, no distress. HEENT: Conjunctiva and lids normal, poor dentition. Neck: Supple, elevated JVP, no carotid bruits. Lungs: Coarse breath sounds with rhonchi and expiratory wheezes. Cardiac: Irregularly irregular, no S3, 2/6 systolic murmur, no pericardial rub. Abdomen: Soft, nontender, bowel sounds present. Extremities: No pitting edema, distal pulses 2+. Skin: Warm and dry. Musculoskeletal: Mild kyphosis. Neuropsychiatric: Alert and oriented x3, affect grossly appropriate.  EKG:  An ECG dated 10/06/2020 was personally reviewed today and demonstrated:  Rate controlled atrial fibrillation with left branch block.  Telemetry:  I personally reviewed telemetry which shows atrial fibrillation.  Relevant CV  Studies:  Echocardiogram 04/09/2020: 1. Left ventricular ejection fraction, by estimation, is 35 to 40%. The  left ventricle has moderately decreased function. The left ventricle  demonstrates global hypokinesis with regional wall motion abnormalities  (see scoring diagram/findings for  description). There is mild left ventricular hypertrophy. Left ventricular  diastolic parameters are consistent with Grade III diastolic dysfunction  (restrictive). There is the interventricular septum is flattened in  systole and diastole, consistent with  right ventricular pressure and volume overload.  2. Right ventricular systolic function is normal. The right ventricular  size is normal. There is moderately elevated pulmonary artery systolic  pressure. The estimated right ventricular systolic pressure is 51.7 mmHg.  3. Left atrial size was moderately dilated.  4. Right atrial size was mild to moderately dilated.  5. The mitral valve is abnormal. Mild mitral valve regurgitation.  6. The aortic valve is tricuspid. Aortic valve regurgitation is not  visualized. Mild to moderate aortic valve sclerosis/calcification is  present, without any evidence of aortic stenosis.  7. The inferior vena cava is dilated in size with <50% respiratory  variability, suggesting right atrial pressure of 15 mmHg.   Cardiac catheterization 04/11/2020:  Prox RCA lesion is 100% stenosed.  Prox LAD lesion is 100% stenosed.  LIMA graft was visualized by angiography and is normal in caliber.  Dist Cx lesion is 90% stenosed.  Ost Cx to Prox Cx lesion is 70% stenosed.  Lat Ramus lesion is 70% stenosed.  Ramus lesion is 70% stenosed.   1. Severe triple vessel CAD s/p 4V CABG with 1/4 patent grafts.  2. Occluded proximal LAD. Patent LIMA to LAD 3. Moderately severe proximal Circumflex stenosis, unchanged from last cath. Severe distal Circumflex disease. Too small for PCI.  4. Moderately severe intermediate disease,  grossly unchanged since last cath  5. Chronically occluded proximal RCA. The distal vessel fills from left to right collaterals.  6. Normal filling pressures  Recommendations: He has moderately severe disease in the intermediate branch and the Circumflex. His LAD is chronically occluded but the LIMA graft to the mid LAD is patent. The RCA is chronically occluded and fills from left to right collaterals. There has been progression of his disease in the intermediate and Circumflex, however, in the absence of angina, I would continue medical management of his CAD at this time.   Laboratory Data:  Chemistry Recent Labs  Lab  10/06/20 0927  NA 143  K 3.7  CL 102  CO2 32  GLUCOSE 200*  BUN 16  CREATININE 1.33*  CALCIUM 9.6  GFRNONAA 52*  ANIONGAP 9    Recent Labs  Lab 10/06/20 0927  PROT 7.0  ALBUMIN 3.7  AST 11*  ALT 14  ALKPHOS 62  BILITOT 2.1*   Hematology Recent Labs  Lab 10/06/20 0927  WBC 6.9  RBC 4.09*  HGB 12.6*  HCT 41.0  MCV 100.2*  MCH 30.8  MCHC 30.7  RDW 13.8  PLT 156   Cardiac Enzymes Recent Labs  Lab 10/06/20 1435  TROPONINIHS 22*   BNP Recent Labs  Lab 10/06/20 0927  BNP 1,161.0*   Radiology/Studies:  DG Chest Port 1 View  Result Date: 10/06/2020 CLINICAL DATA:  Shortness of breath EXAM: PORTABLE CHEST 1 VIEW COMPARISON:  01/15/2019 chest radiograph and prior. FINDINGS: Patchy bibasilar opacities. Small left predominant pleural effusions. No pneumothorax. Cardiomegaly. Post sternotomy sequela. Multilevel spondylosis. IMPRESSION: Patchy basilar opacities and small effusions, less conspicuous than prior exam. Cardiomegaly. Electronically Signed   By: Primitivo Gauze M.D.   On: 10/06/2020 09:28    Assessment and Plan:   1.  Patient presents with shortness of breath, also associated chest congestion and cough, no definite fevers or chills.  SARS coronavirus 2 test is negative.  No substantial change in weight compared to office visit from  May.  He is being admitted to the hospitalist service from the ER with concerns about acute on chronic systolic heart failure, although pulmonary process such as COPD/bronchitis seem to be just as likely based on examination.  BNP 1161 is less than prior hospitalization in May.  He reports compliance with his cardiac regimen, although is hypertensive today. ECG shows IVCD/left bundle branch block which is old.  Initial high-sensitivity troponin I level is not suggestive of ACS.  2.  Multivessel CAD status post CABG with documented graft disease in the setting of NSTEMI back in May.  Medical therapy was recommended at that time.  Outpatient regimen includes aspirin, Coreg, hydralazine, and Zetia.  3.  Ischemic cardiomyopathy, LVEF 35 to 40% with grade 3 diastolic dysfunction as of May.  He is on Coreg, Lasix, Entresto, and [place].  4.  Permanent atrial fibrillation.  CHA2DS2-VASc score is 7.  He is on Eliquis for stroke prophylaxis.  Suggest follow-up echocardiogram to reassess cardiac structure and function.  Would also treat for suspected COPD/bronchitis exacerbation.  Continue aspirin, Eliquis, Coreg, hydralazine, and Entresto.  Reasonable to start with IV Lasix, but would likely convert to p.o. fairly soon.  Start Imdur 30 mg daily.  Would not initiate heparin unless cardiac enzyme trend increases significantly or he begins to report angina symptoms.  Plan at this time is medical therapy optimization without further ischemic work-up unless the scenario changes.  Signed, Rozann Lesches, MD  10/06/2020 4:30 PM

## 2020-10-06 NOTE — ED Triage Notes (Signed)
Pt presents to ED with complaints of increased SOB started this am, denies fever. + productive cough

## 2020-10-06 NOTE — TOC Initial Note (Signed)
Transition of Care [facility]) - Initial/Assessment Note    Patient Details  Name: Gary Odonnell MRN: [id number] Date of Birth: [birth year]  Transition of Care [facility]) CM/SW Contact:    Iona Beard, [place] Phone Number: 10/06/2020, 7:16 PM  Clinical Narrative:                 Pt admitted for CHF exacerbation. TOC received consult for CHF screen. Pt states he lives with his wife. Pt states that he is able to complete ADLs independently. Pt states that he does drive but not far distances. Pt states that he has not been driving much lately due to becoming winded and dizzy spells. Pt states that his wife will provide transportation when needed. Pt states that has had [facility] services within the last 3-4 years but is unsure of the company. Pt states that they came to take his BP and vitals. Pt states that he has a cane, walker, and 2 wheel chairs. Pt states that he was on O2 previously but turned it back in. Pt unsure of what O2 company was used. CSW informed pt that PT may visit him and give recommendation for d/c plan. CSW inquired about pts interest in SNF if recommended by PT. Pt states he is not really interested. Pt states that he has a treadmill at home and a 30 ft ramp around the home. Pt states he is able to do a lot of walking with a cane.   CSW completed CHF screen with pt. Pt states that he does not weigh daily but sometimes. Pt states when he does it is a steady weight. Pt states that he eats what he can, due to only having few teeth he has to keep a softer diet. Pt states that he does not add any salt to meals. Pts cardiologist is Dr. Harl Bowie. Pt states that he takes all medications as prescribed. TOC to follow.   Expected Discharge Plan: Home/Self Care Barriers to Discharge: Continued Medical Work up   Patient Goals and CMS Choice Patient states their goals for this hospitalization and ongoing recovery are:: Return home   Choice offered to / list presented to : NA  Expected Discharge Plan  and Services Expected Discharge Plan: Home/Self Care In-house Referral: Clinical Social Work Discharge Planning Services: NA Post Acute Care Choice: NA                   DME Arranged: N/A DME Agency: NA       HH Arranged: NA HH Agency: NA        Prior Living Arrangements/Services     Patient language and need for interpreter reviewed:: Yes Do you feel safe going back to the place where you live?: Yes        Care giver support system in place?: Yes (comment) (Wisler,Audrey (Spouse) [phone])   Criminal Activity/Legal Involvement Pertinent to Current Situation/Hospitalization: No - Comment as needed  Activities of Daily Living      Permission Sought/Granted                  Emotional Assessment Appearance:: Appears stated age Attitude/Demeanor/Rapport: Engaged Affect (typically observed): Accepting Orientation: : Oriented to Self, Oriented to Place, Oriented to  Time, Oriented to Situation Alcohol / Substance Use: Not Applicable Psych Involvement: No (comment)  Admission diagnosis:  CHF exacerbation ([place]) [I50.9] Patient Active Problem List   Diagnosis Date Noted  . CHF exacerbation ([place]) 10/06/2020  . Acute respiratory failure with hypoxia ([place]) 10/06/2020  .  Acute on chronic combined systolic and diastolic CHF (congestive heart failure) ([place]) 10/06/2020  . DM type 2 (diabetes mellitus, type 2) ([place]) 10/06/2020  . CKD (chronic kidney disease) stage 3, GFR 30-59 ml/min (H[facility]) 10/06/2020  . Abnormal TSH 09/10/2020  . Diabetic nephropathy ([place])   . History of lung cancer 06/09/2020  . Stage 3b chronic kidney disease ([place]) 06/08/2020  . History of non-ST elevation myocardial infarction (NSTEMI) 04/08/2020  . Diet-controlled diabetes mellitus ([place]) 01/10/2019  . Chronic obstructive pulmonary disease ([place]) 01/05/2019  . A-fib ([place]) 01/05/2019  . Tobacco abuse   . Left shoulder pain 09/01/2018  . Iliac artery stenosis, left ([place]) 04/22/2015  . PAD  (peripheral artery disease) ([place]) 04/21/2015  . Carpal tunnel syndrome, left 05/03/2014  . HTN (hypertension) 09/26/2013  . Chronic combined systolic and diastolic CHF (congestive heart failure) ([place]) 09/26/2013  . History of CVA (cerebrovascular accident) 09/26/2013  . CAD (coronary atherosclerotic disease) 09/26/2013  . S/P CABG x 4 09/26/2013   PCP:  Loman Brooklyn, FNP Pharmacy:   [facility] [place] [address] [place] [address] Phone: [phone] Fax: [phone]  [facility] [phone] - [address], [place] - [place] [address] [place] [address] Phone: [phone] Fax: [phone]     Social Determinants of Health (SDOH) Interventions    Readmission Risk Interventions No flowsheet data found.

## 2020-10-06 NOTE — ED Provider Notes (Signed)
[facility] EMERGENCY DEPARTMENT Provider Note   CSN: [id number] Arrival date & time: 10/06/20  0805     History Chief Complaint  Patient presents with  . Shortness of Breath    Gary Odonnell is a 86 y.o. male.  HPI Who presents valuation of shortness of breath with cough productive of clear sputum for several days.  He is using his home medications for heart failure and COPD, without relief.  He states that he has not had a Covid vaccine because "I do not want to."  He denies fever, vomiting, dizziness or weakness.  He presents for evaluation, by EMS.  He denies recent illnesses and last saw his PCP for a routine checkup, about 1 month ago.  He does not have any known sick contacts.  He lives with his wife in their home.  There are no other known modifying factors.    Past Medical History:  Diagnosis Date  . Abnormal TSH   . Atrial fibrillation ([place])   . Cancer ([place])    L lung  . CHF (congestive heart failure) ([place])    a. EF 40-45% in 2015, at EF 30-35% by echo in 12/2018  . COPD (chronic obstructive pulmonary disease) ([place])   . Coronary artery disease    a. s/p CABG with most recent cath in 2014 showing patent LIMA-LAD, occluded Radial to OM1-OM2-PDA and occluded RCA with L--> R collaterals  . Diabetes ([place])   . Diabetic nephropathy ([place])   . Hypercholesteremia   . Hypertension   . PAD (peripheral artery disease) ([place])   . Pneumonia 09/2013  . Primary cancer of left lower lobe of lung ([place]) 02/02/2015  . Radiation april 2016  . Solitary pulmonary nodule on lung CT 12/13/13   11/15/14  . Spinal stenosis   . Stroke [facility]) 11/2008    Patient Active Problem List   Diagnosis Date Noted  . CHF exacerbation ([place]) 10/06/2020  . Abnormal TSH 09/10/2020  . Diabetic nephropathy ([place])   . History of lung cancer 06/09/2020  . Stage 3b chronic kidney disease ([place]) 06/08/2020  . History of non-ST elevation myocardial infarction (NSTEMI) 04/08/2020  . Diet-controlled diabetes  mellitus ([place]) 01/10/2019  . Chronic obstructive pulmonary disease ([place]) 01/05/2019  . A-fib ([place]) 01/05/2019  . Tobacco abuse   . Left shoulder pain 09/01/2018  . Iliac artery stenosis, left ([place]) 04/22/2015  . PAD (peripheral artery disease) ([place]) 04/21/2015  . Carpal tunnel syndrome, left 05/03/2014  . HTN (hypertension) 09/26/2013  . Chronic combined systolic and diastolic CHF (congestive heart failure) ([place]) 09/26/2013  . History of CVA (cerebrovascular accident) 09/26/2013  . CAD (coronary atherosclerotic disease) 09/26/2013  . S/P CABG x 4 09/26/2013    Past Surgical History:  Procedure Laterality Date  . Angioplasty to Left femoral artery    . CARPAL TUNNEL RELEASE Left 05/11/2014   Procedure: LEFT CARPAL TUNNEL RELEASE;  Surgeon: Carole Civil, MD;  Location: [facility] ORS;  Service: Orthopedics;  Laterality: Left;  . COLONOSCOPY N/A 01/24/2015   Procedure: COLONOSCOPY;  Surgeon: Danie Binder, MD;  Location: [facility] ENDO SUITE;  Service: Endoscopy;  Laterality: N/A;  130  . CORONARY ARTERY BYPASS GRAFT     x4  . FEMORAL-POPLITEAL BYPASS GRAFT Right 10/23/2015   Procedure: BYPASS GRAFT FEMORAL TO BELOW KNEE POPLITEAL ARTERY USING RIGHT NON-REVERSED GREATER SAPPHENOUS VEIN;  Surgeon: [name], MD;  Location: [place];  Service: Vascular;  Laterality: Right;  . INTRAOPERATIVE ARTERIOGRAM Right 10/23/2015   Procedure:  INTRA OPERATIVE ARTERIOGRAM - RIGHT LOWER LEG;  Surgeon: [name], MD;  Location: [place];  Service: Vascular;  Laterality: Right;  . KNEE ARTHROSCOPY Right   . LEFT AND RIGHT HEART CATHETERIZATION WITH CORONARY ANGIOGRAM N/A 09/29/2013   Procedure: LEFT AND RIGHT HEART CATHETERIZATION WITH CORONARY ANGIOGRAM;  Surgeon: Burnell Blanks, MD;  Location: [facility] CATH LAB;  Service: Cardiovascular;  Laterality: N/A;  . LEFT HEART CATH AND CORS/GRAFTS ANGIOGRAPHY N/A 04/11/2020   Procedure: LEFT HEART CATH AND CORS/GRAFTS ANGIOGRAPHY;  Surgeon: Burnell Blanks, MD;  Location: [place] CV LAB;  Service: Cardiovascular;  Laterality: N/A;  . PERIPHERAL VASCULAR CATHETERIZATION N/A 04/21/2015   Procedure: Abdominal Aortogram w/Lower Extremity;  Surgeon: [name], MD;  Location: [place] CV LAB;  Service: Cardiovascular;  Laterality: N/A;  . Stent to right femoral artery    . TRIGGER FINGER RELEASE Bilateral   . VEIN HARVEST Right 10/23/2015   Procedure: [place];  Surgeon: [name], MD;  Location: [place];  Service: Vascular;  Laterality: Right;       Family History  Problem Relation Age of Onset  . Heart attack Father   . Hyperlipidemia Father   . Diabetes Mother   . Diabetes Brother   . Heart Problems Brother   . Colon cancer Neg Hx     Social History   Tobacco Use  . Smoking status: Current Every Day Smoker    Packs/day: 0.50    Years: 60.00    Pack years: 30.00    Types: Cigars    Start date: 11/21/1949  . Smokeless tobacco: Never Used  . Tobacco comment: 1-2 cigars per day   Vaping Use  . Vaping Use: Never used  Substance Use Topics  . Alcohol use: No    Alcohol/week: 0.0 standard drinks  . Drug use: No    Home Medications Prior to Admission medications   Medication Sig Start Date End Date Taking? Authorizing Provider  albuterol (VENTOLIN HFA) 108 (90 Base) MCG/ACT inhaler Inhale 2 puffs into the lungs every 6 (six) hours as needed for wheezing or shortness of breath. 04/11/20  Yes Debbe Odea, MD  apixaban (ELIQUIS) 5 MG TABS tablet Take 1 tablet (5 mg total) by mouth 2 (two) times daily. 08/04/20  Yes BranchAlphonse Guild, MD  aspirin 81 MG EC tablet Take 1 tablet (81 mg total) by mouth daily. 04/12/20  Yes Debbe Odea, MD  Budeson-Glycopyrrol-Formoterol (BREZTRI AEROSPHERE) 160-9-4.8 MCG/ACT AERO Inhale 2 puffs into the lungs in the morning and at bedtime. 09/15/20  Yes Gottschalk, Leatrice Jewels M, DO  Carboxymethylcellulose Sodium (EYE DROPS OP) Apply 1 drop to eye daily as  needed (dry eyes).    Yes [provider]  carvedilol (COREG) 6.25 MG tablet Take 1 tablet (6.25 mg total) by mouth 2 (two) times daily with a meal. 08/04/20  Yes Branch, Alphonse Guild, MD  ezetimibe (ZETIA) 10 MG tablet Take 1 tablet (10 mg total) by mouth daily. 06/13/20 10/06/20 Yes Branch, Alphonse Guild, MD  furosemide (LASIX) 40 MG tablet Take one tab (40mg ) by mouth every other day alternating with 1 1/2 tabs (60mg ) every other day. 06/13/20  Yes Branch, Alphonse Guild, MD  hydrALAZINE (APRESOLINE) 25 MG tablet Take 1.5 tablets (37.5 mg total) by mouth every 8 (eight) hours. 06/06/20  Yes Loman [place], FNP  Potassium 99 MG TABS Take 1 tablet by mouth daily.    Yes [provider]  sacubitril-valsartan Delene Loll)  49-51 MG Take 1 tablet by mouth 2 (two) times daily. 06/06/20  Yes Hendricks Limes F, FNP  dapagliflozin propanediol (FARXIGA) 10 MG TABS tablet Take 1 tablet (10 mg total) by mouth daily before breakfast. Patient not taking: Reported on 10/06/2020 09/06/20   Loman [place], FNP  nitroGLYCERIN (NITROSTAT) 0.3 MG SL tablet Place 1 tablet (0.3 mg total) under the tongue every 5 (five) minutes as needed for chest pain. 04/11/20 04/11/21  Debbe Odea, MD    Allergies    Statins  Review of Systems   Review of Systems  All other systems reviewed and are negative.   Physical Exam Updated Vital Signs BP (!) 172/83   Pulse 68   Temp (!) 97.5 F (36.4 C) (Oral)   Resp 20   Ht 5' 11.5" (1.816 m)   Wt 87.5 kg   SpO2 97%   BMI 26.54 kg/m   Physical Exam Vitals and nursing note reviewed.  Constitutional:      General: He is not in acute distress.    Appearance: He is well-developed. He is ill-appearing. He is not toxic-appearing or diaphoretic.  HENT:     Head: Normocephalic and atraumatic.     Right Ear: External ear normal.     Left Ear: External ear normal.  Eyes:     Conjunctiva/sclera: Conjunctivae normal.     Pupils: Pupils are equal, round, and reactive to  light.  Neck:     Trachea: Phonation normal.  Cardiovascular:     Rate and Rhythm: Normal rate and regular rhythm.     Comments: Normal radial pulse, right wrist Pulmonary:     Effort: Pulmonary effort is normal. No respiratory distress.     Breath sounds: No stridor.  Abdominal:     General: There is no distension.     Palpations: Abdomen is soft.     Tenderness: There is no abdominal tenderness.     Hernia: No hernia is present.  Musculoskeletal:        General: No swelling or tenderness. Normal range of motion.     Cervical back: Normal range of motion and neck supple.     Right lower leg: No edema.     Left lower leg: No edema.  Skin:    General: Skin is warm and dry.  Neurological:     Mental Status: He is alert and oriented to person, place, and time.     Cranial Nerves: No cranial nerve deficit.     Sensory: No sensory deficit.     Motor: No abnormal muscle tone.     Coordination: Coordination normal.  Psychiatric:        Mood and Affect: Mood normal.        Behavior: Behavior normal.        Thought Content: Thought content normal.        Judgment: Judgment normal.     ED Results / Procedures / Treatments   Labs (all labs ordered are listed, but only abnormal results are displayed) Labs Reviewed  COMPREHENSIVE METABOLIC PANEL - Abnormal; Notable for the following components:      Result Value   Glucose, Bld 200 (*)    Creatinine, Ser 1.33 (*)    AST 11 (*)    Total Bilirubin 2.1 (*)    GFR, Estimated 52 (*)    All other components within normal limits  CBC WITH DIFFERENTIAL/PLATELET - Abnormal; Notable for the following components:   RBC 4.09 (*)    Hemoglobin  12.6 (*)    MCV 100.2 (*)    All other components within normal limits  URINALYSIS, ROUTINE W REFLEX MICROSCOPIC - Abnormal; Notable for the following components:   Glucose, UA 50 (*)    Hgb urine dipstick SMALL (*)    Protein, ur 100 (*)    Leukocytes,Ua MODERATE (*)    All other components  within normal limits  BRAIN NATRIURETIC PEPTIDE - Abnormal; Notable for the following components:   B Natriuretic Peptide 1,161.0 (*)    All other components within normal limits  RESPIRATORY PANEL BY RT PCR (FLU A&B, COVID)  TROPONIN I (HIGH SENSITIVITY)    EKG EKG Interpretation  Date/Time:  Friday October 06 2020 08:24:57 EST Ventricular Rate:  62 PR Interval:    QRS Duration: 140 QT Interval:  442 QTC Calculation: 449 R Axis:   -66 Text Interpretation: Atrial fibrillation Left bundle branch block since last tracing no significant change Confirmed by Daleen Bo [phone]) on 10/06/2020 9:35:42 AM   Radiology DG Chest Port 1 View  Result Date: 10/06/2020 CLINICAL DATA:  Shortness of breath EXAM: PORTABLE CHEST 1 VIEW COMPARISON:  01/15/2019 chest radiograph and prior. FINDINGS: Patchy bibasilar opacities. Small left predominant pleural effusions. No pneumothorax. Cardiomegaly. Post sternotomy sequela. Multilevel spondylosis. IMPRESSION: Patchy basilar opacities and small effusions, less conspicuous than prior exam. Cardiomegaly. Electronically Signed   By: Primitivo Gauze M.D.   On: 10/06/2020 09:28    Procedures .Critical Care Performed by: Daleen Bo, MD Authorized by: Daleen Bo, MD   Critical care provider statement:    Critical care time (minutes):  35   Critical care start time:  10/06/2020 8:40 AM   Critical care end time:  10/06/2020 1:57 PM   Critical care time was exclusive of:  Separately billable procedures and treating other patients   Critical care was necessary to treat or prevent imminent or life-threatening deterioration of the following conditions:  Cardiac failure   Critical care was time spent personally by me on the following activities:  Blood draw for specimens, development of treatment plan with patient or surrogate, discussions with consultants, evaluation of patient's response to treatment, examination of patient, obtaining history from  patient or surrogate, ordering and performing treatments and interventions, ordering and review of laboratory studies, pulse oximetry, re-evaluation of patient's condition, review of old charts and ordering and review of radiographic studies   (including critical care time)  Medications Ordered in ED Medications  furosemide (LASIX) injection 40 mg (40 mg Intravenous Given 10/06/20 1215)    ED Course  I have reviewed the triage vital signs and the nursing notes.  Pertinent labs & imaging results that were available during my care of the patient were reviewed by me and considered in my medical decision making (see chart for details).  Clinical Course as of Oct 06 1356  Fri Oct 06, 2020  1157 At this time the patient is comfortable, and remains on oxygen at 2 L nasal cannula.  Oxygen saturation 97%.  Patient states that he would like to be "back on oxygen," because it makes him feel better.  At this time he does not appear dyspneic.  I turned the oxygen off, to assess him for an oxygen requirement.   [EW]  9767 Blood pressure currently 177/81. Trial off oxygen, he desaturates to 88% within about 15 minutes. Back on oxygen now his oxygen saturation is normal at 97%. He has urinated a small amount since being treated with Lasix. Patient's wife is here now  and findings were discussed with her.   [EW]    Clinical Course User Index [EW] Daleen Bo, MD   MDM Rules/Calculators/A&P                           Patient Vitals for the past 24 hrs:  BP Temp Temp src Pulse Resp SpO2 Height Weight  10/06/20 1330 (!) 172/83 -- -- 68 20 97 % -- --  10/06/20 1306 (!) 177/81 -- -- (!) 44 (!) 25 99 % -- --  10/06/20 1230 (!) 195/76 -- -- (!) 55 (!) 24 98 % -- --  10/06/20 1130 (!) 173/75 -- -- (!) 53 (!) 22 98 % -- --  10/06/20 1105 (!) 166/69 -- -- (!) 52 19 96 % -- --  10/06/20 1030 (!) 166/62 -- -- (!) 55 (!) 21 95 % -- --  10/06/20 1000 (!) 158/51 -- -- 66 (!) 21 95 % -- --  10/06/20 0945 -- --  -- (!) 50 (!) 25 95 % -- --  10/06/20 0930 (!) 164/55 -- -- (!) 57 (!) 23 96 % -- --  10/06/20 0915 -- -- -- 67 17 95 % -- --  10/06/20 0900 (!) 143/68 -- -- 68 (!) 24 95 % -- --  10/06/20 0845 -- -- -- (!) 58 (!) 24 95 % -- --  10/06/20 0830 (!) 166/59 -- -- 61 (!) 24 93 % -- --  10/06/20 0823 (!) 183/80 (!) 97.5 F (36.4 C) Oral 70 (!) 22 95 % -- --  10/06/20 0816 -- -- -- -- -- -- 5' 11.5" (1.816 m) 87.5 kg    1:57 PM Reevaluation with update and discussion. After initial assessment and treatment, an updated evaluation reveals he continues to urinate well at this time, and has no further complaints.  Findings discussed with the patient and all questions were answered. Daleen Bo   Medical Decision Making:  This patient is presenting for evaluation of shortness of breath and cough, which does require a range of treatment options, and is a complaint that involves a high risk of morbidity and mortality. The differential diagnoses include Covid infection, CHF, bronchitis, pneumonia, COPD exacerbation. I decided to review old records, and in summary elderly male presenting with respiratory symptoms, unspecified, at risk for Covid infection.  I do not require additional historical information from anyone.  Clinical Laboratory Tests Ordered, included CBC, Metabolic panel, Urinalysis and Covid test for symptomatic reason. Review indicates normal except BNP high, glucose high, creatinine high, AST low, red and white cells in urine, hemoglobin low. Radiologic Tests Ordered, chest x-ray.  I independently Visualized: Radiograph images, which show patchy bibasilar opacities  Cardiac Monitor Tracing which shows atrial fibrillation, rates 55-65    Critical Interventions-clinical evaluation, laboratory testing, radiography, observation, IV Lasix, reassessment  After These Interventions, the Patient was reevaluated and was found with signs and symptoms of congestive heart failure.  Screening for  acute infectious process, is negative.  Urinalysis abnormal, but he is not having urinary tract symptoms.  Covid and flu tests are negative.  BNP elevated.  He takes Lasix chronically.  He has history of systolic heart failure, with an EF of 35 to 40%.  Cardiac catheterization in May 2021 showed severe multivessel native and bypass graft vascular disease.  At that time, without ongoing angina, medical therapy was advised.  He last saw his cardiologist on 04/14/2020, following hospitalization during which he had a catheterization. The patient requires  hospitalization for stabilization for apparent acute on chronic heart failure. He has a new oxygen requirement. Suspect multi factorial worsening of chronic heart failure symptoms.  Gary Odonnell was evaluated in Emergency Department on 10/06/2020 for the symptoms described in the history of present illness. He was evaluated in the context of the global COVID-19 pandemic, which necessitated consideration that the patient might be at risk for infection with the SARS-CoV-2 virus that causes COVID-19. Institutional protocols and algorithms that pertain to the evaluation of patients at risk for COVID-19 are in a state of rapid change based on information released by regulatory bodies including the CDC and federal and state organizations. These policies and algorithms were followed during the patient's care in the ED.  CRITICAL CARE-yes Performed by: Daleen Bo  Nursing Notes Reviewed/ Care Coordinated Applicable Imaging Reviewed Interpretation of Laboratory Data incorporated into ED treatment   1:10 PM-Consult complete with hospitalist. Patient case explained and discussed.  He agrees to admit patient for further evaluation and treatment. Call ended at 1:50 PM    Final Clinical Impression(s) / ED Diagnoses Final diagnoses:  Acute on chronic congestive heart failure, unspecified heart failure type ([place])  Hypoxia    Rx / DC Orders ED Discharge Orders     None       Daleen Bo, MD 10/06/20 1358

## 2020-10-06 NOTE — H&P (Signed)
History and Physical    Gary Odonnell YQI:[id number] DOB: 1932-04-24 DOA: 10/06/2020  PCP: Loman Brooklyn, FNP  Patient coming from: Home  I have personally briefly reviewed patient's old medical records in [place]  Chief Complaint: Shortness of breath  HPI: Gary Odonnell is a 86 y.o. male with medical history significant of COPD, chronic combined CHF, atrial fibrillation on anticoagulation, diabetes and hypertension, presents to the hospital with complaints of shortness of breath.  Patient reports he has had intermittent shortness of breath and cough for several weeks now.  His symptoms progressively got worse starting at 4 AM this morning.  The patient had gotten up to use the bathroom and noted that he was increasingly short of breath.  He has dyspnea worse on exertion.  He denies any chest pain.  He is not had any fever.  He does have a productive cough of clear sputum.  He has not had any vomiting or diarrhea.  He has not had any sore throat.  He is not noticed any worsening peripheral edema.  He reports compliance with medications  ED Course: He was evaluated in the emergency room where he was noted to be hypoxic on room air.  Chest x-ray indicated possible developing CHF.  BNP elevated.  Troponin was not significantly elevated.  He received a dose of IV Lasix for concern for underlying CHF.  He is being admitted for further treatments.  Review of Systems: Review of Systems  Constitutional: Negative for chills and fever.  HENT: Negative for congestion and sore throat.   Eyes: Negative for blurred vision and double vision.  Respiratory: Positive for cough, sputum production, shortness of breath and wheezing.   Cardiovascular: Negative for chest pain, palpitations and leg swelling.  Gastrointestinal: Negative for abdominal pain, diarrhea and vomiting.  Genitourinary: Negative for dysuria.  Musculoskeletal: Positive for back pain.  Neurological: Negative for dizziness and  loss of consciousness.      Past Medical History:  Diagnosis Date  . Atrial fibrillation ([place])   . CHF (congestive heart failure) ([place])    a. EF 40-45% in 2015, at EF 30-35% by echo in 12/2018  . COPD (chronic obstructive pulmonary disease) ([place])   . Coronary artery disease    a. s/p CABG with most recent cath in 2014 showing patent LIMA-LAD, occluded Radial to OM1-OM2-PDA and occluded RCA with L--> R collaterals  . Diabetic nephropathy ([place])   . Essential hypertension   . Hyperlipidemia   . PAD (peripheral artery disease) ([place])   . Pneumonia 09/2013  . Primary cancer of left lower lobe of lung ([place]) 02/02/2015  . Radiation 02/2015  . Spinal stenosis   . Stroke ([place]) 11/2008  . Type 2 diabetes mellitus ([place])     Past Surgical History:  Procedure Laterality Date  . Angioplasty to Left femoral artery    . CARPAL TUNNEL RELEASE Left 05/11/2014   Procedure: LEFT CARPAL TUNNEL RELEASE;  Surgeon: Gary Civil, MD;  Location: [facility];  Service: Orthopedics;  Laterality: Left;  . COLONOSCOPY N/A 01/24/2015   Procedure: COLONOSCOPY;  Surgeon: Gary Binder, MD;  Location: [facility];  Service: Endoscopy;  Laterality: N/A;  130  . CORONARY ARTERY BYPASS GRAFT     x4  . FEMORAL-POPLITEAL BYPASS GRAFT Right 10/23/2015   Procedure: BYPASS GRAFT FEMORAL TO BELOW KNEE POPLITEAL ARTERY USING RIGHT NON-REVERSED GREATER SAPPHENOUS VEIN;  Surgeon: Gary Dutch, MD;  Location: [facility];  Service: Vascular;  Laterality: Right;  .  INTRAOPERATIVE ARTERIOGRAM Right 10/23/2015   Procedure: INTRA OPERATIVE ARTERIOGRAM - RIGHT LOWER LEG;  Surgeon: Gary Dutch, MD;  Location: [facility];  Service: Vascular;  Laterality: Right;  . KNEE ARTHROSCOPY Right   . LEFT AND RIGHT HEART CATHETERIZATION WITH CORONARY ANGIOGRAM N/A 09/29/2013   Procedure: LEFT AND RIGHT HEART CATHETERIZATION WITH CORONARY ANGIOGRAM;  Surgeon: Gary Blanks, MD;  Location: [facility] CATH LAB;  Service: Cardiovascular;   Laterality: N/A;  . LEFT HEART CATH AND CORS/GRAFTS ANGIOGRAPHY N/A 04/11/2020   Procedure: LEFT HEART CATH AND CORS/GRAFTS ANGIOGRAPHY;  Surgeon: Gary Blanks, MD;  Location: [facility] CV LAB;  Service: Cardiovascular;  Laterality: N/A;  . PERIPHERAL VASCULAR CATHETERIZATION N/A 04/21/2015   Procedure: Abdominal Aortogram w/Lower Extremity;  Surgeon: Gary Dutch, MD;  Location: [place] [facility] CV LAB;  Service: Cardiovascular;  Laterality: N/A;  . Stent to right femoral artery    . TRIGGER FINGER RELEASE Bilateral   . VEIN HARVEST Right 10/23/2015   Procedure: [place];  Surgeon: Gary Dutch, MD;  Location: [facility];  Service: Vascular;  Laterality: Right;    Social History:  reports that he has been smoking cigars. He started smoking about 70 years ago. He has a 30.00 pack-year smoking history. He has never used smokeless tobacco. He reports that he does not drink alcohol and does not use drugs.  Allergies  Allergen Reactions  . Statins Other (See Comments)    Intolerance per outpt Cardiology notes    Family History  Problem Relation Age of Onset  . Heart attack Father   . Hyperlipidemia Father   . Diabetes Mother   . Diabetes Brother   . Heart Problems Brother   . Colon cancer Neg Hx      Prior to Admission medications   Medication Sig Start Date End Date Taking? Authorizing Provider  albuterol (VENTOLIN HFA) 108 (90 Base) MCG/ACT inhaler Inhale 2 puffs into the lungs every 6 (six) hours as needed for wheezing or shortness of breath. 04/11/20  Yes Debbe Odea, MD  apixaban (ELIQUIS) 5 MG TABS tablet Take 1 tablet (5 mg total) by mouth 2 (two) times daily. 08/04/20  Yes BranchAlphonse Guild, MD  aspirin 81 MG EC tablet Take 1 tablet (81 mg total) by mouth daily. 04/12/20  Yes Debbe Odea, MD  Budeson-Glycopyrrol-Formoterol (BREZTRI AEROSPHERE) 160-9-4.8 MCG/ACT AERO Inhale 2 puffs into the lungs in the morning and at bedtime. 09/15/20   Yes Gary, Leatrice Odonnell M, DO  Carboxymethylcellulose Sodium (EYE DROPS OP) Apply 1 drop to eye daily as needed (dry eyes).    Yes [provider]  carvedilol (COREG) 6.25 MG tablet Take 1 tablet (6.25 mg total) by mouth 2 (two) times daily with a meal. 08/04/20  Yes Gary, Alphonse Guild, MD  ezetimibe (ZETIA) 10 MG tablet Take 1 tablet (10 mg total) by mouth daily. 06/13/20 10/06/20 Yes Gary, Alphonse Guild, MD  furosemide (LASIX) 40 MG tablet Take one tab (40mg ) by mouth every other day alternating with 1 1/2 tabs (60mg ) every other day. 06/13/20  Yes Gary, Alphonse Guild, MD  hydrALAZINE (APRESOLINE) 25 MG tablet Take 1.5 tablets (37.5 mg total) by mouth every 8 (eight) hours. 06/06/20  Yes Loman Brooklyn, FNP  Potassium 99 MG TABS Take 1 tablet by mouth daily.    Yes [provider]  sacubitril-valsartan (ENTRESTO) 49-51 MG Take 1 tablet by mouth 2 (two) times daily. 06/06/20  Yes Loman Brooklyn, FNP  dapagliflozin propanediol (  FARXIGA) 10 MG TABS tablet Take 1 tablet (10 mg total) by mouth daily before breakfast. Patient not taking: Reported on 10/06/2020 09/06/20   Loman Brooklyn, FNP  nitroGLYCERIN (NITROSTAT) 0.3 MG SL tablet Place 1 tablet (0.3 mg total) under the tongue every 5 (five) minutes as needed for chest pain. 04/11/20 04/11/21  Debbe Odea, MD    Physical Exam: Vitals:   10/06/20 1530 10/06/20 1600 10/06/20 1630 10/06/20 1700  BP: (!) 185/77 (!) 157/64 (!) 174/71 (!) 161/62  Pulse: 78 (!) 44 (!) 51 63  Resp: (!) 26 (!) 21 19 20   Temp:      TempSrc:      SpO2: 98% 98% 97% 98%  Weight:      Height:        Constitutional: NAD, calm, comfortable Eyes: PERRL, lids and conjunctivae normal ENMT: Mucous membranes are moist. Posterior pharynx clear of any exudate or lesions.Normal dentition.  Neck: normal, supple, no masses, no thyromegaly Respiratory: Coarse breath sounds bilaterally. Normal respiratory effort. No accessory muscle use.  Cardiovascular: Irregular  rate and rhythm, no murmurs / rubs / gallops. No extremity edema. 2+ pedal pulses. No carotid bruits.  Abdomen: no tenderness, no masses palpated. No hepatosplenomegaly. Bowel sounds positive.  Musculoskeletal: no clubbing / cyanosis. No joint deformity upper and lower extremities. Good ROM, no contractures. Normal muscle tone.  Skin: no rashes, lesions, ulcers. No induration Neurologic: CN 2-12 grossly intact. Sensation intact, DTR normal. Strength 5/5 in all 4.  Psychiatric: Normal judgment and insight. Alert and oriented x 3. Normal mood.    Labs on Admission: I have personally reviewed following labs and imaging studies  CBC: Recent Labs  Lab 10/06/20 0927  WBC 6.9  NEUTROABS 5.2  HGB 12.6*  HCT 41.0  MCV 100.2*  PLT 376   Basic Metabolic Panel: Recent Labs  Lab 10/06/20 0927  NA 143  K 3.7  CL 102  CO2 32  GLUCOSE 200*  BUN 16  CREATININE 1.33*  CALCIUM 9.6   GFR: Estimated Creatinine Clearance: 43.9 mL/min (A) (by C-G formula based on SCr of 1.33 mg/dL (H)). Liver Function Tests: Recent Labs  Lab 10/06/20 0927  AST 11*  ALT 14  ALKPHOS 62  BILITOT 2.1*  PROT 7.0  ALBUMIN 3.7   No results for input(s): LIPASE, AMYLASE in the last 168 hours. No results for input(s): AMMONIA in the last 168 hours. Coagulation Profile: No results for input(s): INR, PROTIME in the last 168 hours. Cardiac Enzymes: No results for input(s): CKTOTAL, CKMB, CKMBINDEX, TROPONINI in the last 168 hours. BNP (last 3 results) No results for input(s): PROBNP in the last 8760 hours. HbA1C: No results for input(s): HGBA1C in the last 72 hours. CBG: No results for input(s): GLUCAP in the last 168 hours. Lipid Profile: No results for input(s): CHOL, HDL, LDLCALC, TRIG, CHOLHDL, LDLDIRECT in the last 72 hours. Thyroid Function Tests: No results for input(s): TSH, T4TOTAL, FREET4, T3FREE, THYROIDAB in the last 72 hours. Anemia Panel: No results for input(s): VITAMINB12, FOLATE,  FERRITIN, TIBC, IRON, RETICCTPCT in the last 72 hours. Urine analysis:    Component Value Date/Time   COLORURINE YELLOW 10/06/2020 1027   APPEARANCEUR CLEAR 10/06/2020 1027   LABSPEC 1.019 10/06/2020 1027   PHURINE 6.0 10/06/2020 1027   GLUCOSEU 50 (A) 10/06/2020 1027   HGBUR SMALL (A) 10/06/2020 1027   BILIRUBINUR NEGATIVE 10/06/2020 1027   [place] 10/06/2020 1027   PROTEINUR 100 (A) 10/06/2020 1027   NITRITE NEGATIVE 10/06/2020 1027  LEUKOCYTESUR MODERATE (A) 10/06/2020 1027    Radiological Exams on Admission: DG Chest Port 1 View  Result Date: 10/06/2020 CLINICAL DATA:  Shortness of breath EXAM: PORTABLE CHEST 1 VIEW COMPARISON:  01/15/2019 chest radiograph and prior. FINDINGS: Patchy bibasilar opacities. Small left predominant pleural effusions. No pneumothorax. Cardiomegaly. Post sternotomy sequela. Multilevel spondylosis. IMPRESSION: Patchy basilar opacities and small effusions, less conspicuous than prior exam. Cardiomegaly. Electronically Signed   By: Primitivo Gauze M.D.   On: 10/06/2020 09:28    EKG: Independently reviewed.  Atrial fibrillation with left bundle Gary block  Assessment/Plan Active Problems:   HTN (hypertension)   Chronic obstructive pulmonary disease (HCC)   A-fib (HCC)   Tobacco abuse   CHF exacerbation (HCC)   Acute respiratory failure with hypoxia (HCC)   Acute on chronic combined systolic and diastolic CHF (congestive heart failure) (HCC)   DM type 2 (diabetes mellitus, type 2) (HCC)   CKD (chronic kidney disease) stage 3, GFR 30-59 ml/min (HCC)     Acute respiratory failure with hypoxia -Secondary to CHF versus COPD -Continue to wean off oxygen as tolerated  Acute on chronic combined CHF. -Echocardiogram reordered to update EF -He is already on a fairly good medication regimen with beta-blockers, Entresto -Continue on IV Lasix -Cardiology following, appreciate input  COPD -Patient does have a cough, wheezing shortness  of breath -We will continue on bronchodilators, inhaled steroids -Start on antibiotics -Started on a course of prednisone  Atrial fibrillation -Heart rate is currently stable -Continue anticoagulation with Eliquis  Diabetes -Check A1c -Hold home dose of Farxiga -Start on sliding scale insulin  Hypertension -Continue home medications  Chronic kidney disease stage IIIa -Creatinine currently at baseline -Continue to monitor in the setting of diuresis  DVT prophylaxis: eliquis Code Status: DNR Family Communication: updated wife over the phone Disposition Plan: Discharge home once shortness of breath has improved Consults called: Cardiology Admission status: Telemetry, observation  Kathie Dike MD [facility]   If 7PM-7AM, please contact night-coverage [email or web address]   10/06/2020, 5:40 PM

## 2020-10-06 NOTE — ED Notes (Signed)
Pt O2 saturations dropped to 88% and patient complaining of chest pain, 1L O2 administered

## 2020-10-07 ENCOUNTER — Observation Stay (HOSPITAL_COMMUNITY): Payer: Medicare Other

## 2020-10-07 DIAGNOSIS — Z7901 Long term (current) use of anticoagulants: Secondary | ICD-10-CM | POA: Diagnosis not present

## 2020-10-07 DIAGNOSIS — Z85118 Personal history of other malignant neoplasm of bronchus and lung: Secondary | ICD-10-CM | POA: Diagnosis not present

## 2020-10-07 DIAGNOSIS — I5043 Acute on chronic combined systolic (congestive) and diastolic (congestive) heart failure: Secondary | ICD-10-CM | POA: Diagnosis present

## 2020-10-07 DIAGNOSIS — I252 Old myocardial infarction: Secondary | ICD-10-CM | POA: Diagnosis not present

## 2020-10-07 DIAGNOSIS — I255 Ischemic cardiomyopathy: Secondary | ICD-10-CM

## 2020-10-07 DIAGNOSIS — J9601 Acute respiratory failure with hypoxia: Secondary | ICD-10-CM

## 2020-10-07 DIAGNOSIS — Z8673 Personal history of transient ischemic attack (TIA), and cerebral infarction without residual deficits: Secondary | ICD-10-CM | POA: Diagnosis not present

## 2020-10-07 DIAGNOSIS — E1151 Type 2 diabetes mellitus with diabetic peripheral angiopathy without gangrene: Secondary | ICD-10-CM | POA: Diagnosis present

## 2020-10-07 DIAGNOSIS — Z79899 Other long term (current) drug therapy: Secondary | ICD-10-CM | POA: Diagnosis not present

## 2020-10-07 DIAGNOSIS — I4821 Permanent atrial fibrillation: Secondary | ICD-10-CM | POA: Diagnosis present

## 2020-10-07 DIAGNOSIS — F17290 Nicotine dependence, other tobacco product, uncomplicated: Secondary | ICD-10-CM | POA: Diagnosis present

## 2020-10-07 DIAGNOSIS — E785 Hyperlipidemia, unspecified: Secondary | ICD-10-CM | POA: Diagnosis present

## 2020-10-07 DIAGNOSIS — R0902 Hypoxemia: Secondary | ICD-10-CM | POA: Diagnosis not present

## 2020-10-07 DIAGNOSIS — E1122 Type 2 diabetes mellitus with diabetic chronic kidney disease: Secondary | ICD-10-CM | POA: Diagnosis present

## 2020-10-07 DIAGNOSIS — Z66 Do not resuscitate: Secondary | ICD-10-CM | POA: Diagnosis present

## 2020-10-07 DIAGNOSIS — N1831 Chronic kidney disease, stage 3a: Secondary | ICD-10-CM | POA: Diagnosis present

## 2020-10-07 DIAGNOSIS — J449 Chronic obstructive pulmonary disease, unspecified: Secondary | ICD-10-CM | POA: Diagnosis present

## 2020-10-07 DIAGNOSIS — Z7982 Long term (current) use of aspirin: Secondary | ICD-10-CM | POA: Diagnosis not present

## 2020-10-07 DIAGNOSIS — Z20822 Contact with and (suspected) exposure to covid-19: Secondary | ICD-10-CM | POA: Diagnosis present

## 2020-10-07 DIAGNOSIS — I2510 Atherosclerotic heart disease of native coronary artery without angina pectoris: Secondary | ICD-10-CM | POA: Diagnosis present

## 2020-10-07 DIAGNOSIS — I130 Hypertensive heart and chronic kidney disease with heart failure and stage 1 through stage 4 chronic kidney disease, or unspecified chronic kidney disease: Secondary | ICD-10-CM | POA: Diagnosis present

## 2020-10-07 DIAGNOSIS — I482 Chronic atrial fibrillation, unspecified: Secondary | ICD-10-CM | POA: Diagnosis not present

## 2020-10-07 DIAGNOSIS — I159 Secondary hypertension, unspecified: Secondary | ICD-10-CM | POA: Diagnosis not present

## 2020-10-07 DIAGNOSIS — Z951 Presence of aortocoronary bypass graft: Secondary | ICD-10-CM | POA: Diagnosis not present

## 2020-10-07 LAB — COMPREHENSIVE METABOLIC PANEL
ALT: 12 U/L (ref 0–44)
AST: 10 U/L — ABNORMAL LOW (ref 15–41)
Albumin: 3.4 g/dL — ABNORMAL LOW (ref 3.5–5.0)
Alkaline Phosphatase: 54 U/L (ref 38–126)
Anion gap: 11 (ref 5–15)
BUN: 19 mg/dL (ref 8–23)
CO2: 28 mmol/L (ref 22–32)
Calcium: 9.5 mg/dL (ref 8.9–10.3)
Chloride: 101 mmol/L (ref 98–111)
Creatinine, Ser: 1.22 mg/dL (ref 0.61–1.24)
GFR, Estimated: 58 mL/min — ABNORMAL LOW (ref >60)
Glucose, Bld: 137 mg/dL — ABNORMAL HIGH (ref 70–99)
Potassium: 3.2 mmol/L — ABNORMAL LOW (ref 3.5–5.1)
Sodium: 140 mmol/L (ref 135–145)
Total Bilirubin: 2.3 mg/dL — ABNORMAL HIGH (ref 0.3–1.2)
Total Protein: 6.4 g/dL — ABNORMAL LOW (ref 6.5–8.1)

## 2020-10-07 LAB — CBC
HCT: 37.7 % — ABNORMAL LOW (ref 39.0–52.0)
Hemoglobin: 11.8 g/dL — ABNORMAL LOW (ref 13.0–17.0)
MCH: 30.8 pg (ref 26.0–34.0)
MCHC: 31.3 g/dL (ref 30.0–36.0)
MCV: 98.4 fL (ref 80.0–100.0)
Platelets: 154 10*3/uL (ref 150–400)
RBC: 3.83 MIL/uL — ABNORMAL LOW (ref 4.22–5.81)
RDW: 13.6 % (ref 11.5–15.5)
WBC: 8.5 10*3/uL (ref 4.0–10.5)
nRBC: 0 % (ref 0.0–0.2)

## 2020-10-07 LAB — GLUCOSE, CAPILLARY
Glucose-Capillary: 135 mg/dL — ABNORMAL HIGH (ref 70–99)
Glucose-Capillary: 211 mg/dL — ABNORMAL HIGH (ref 70–99)
Glucose-Capillary: 193 mg/dL — ABNORMAL HIGH (ref 70–99)
Glucose-Capillary: 195 mg/dL — ABNORMAL HIGH (ref 70–99)

## 2020-10-07 LAB — ECHOCARDIOGRAM COMPLETE
AR max vel: 1.44 cm2
AV Peak grad: 15.3 mmHg
Ao pk vel: 1.96 m/s
Area-P 1/2: 3.39 cm2
Height: 71.5 in
S' Lateral: 4.47 cm
Weight: 3100.55 oz

## 2020-10-07 LAB — HEMOGLOBIN A1C
Hgb A1c MFr Bld: 6.7 % — ABNORMAL HIGH (ref 4.8–5.6)
Mean Plasma Glucose: 145.59 mg/dL

## 2020-10-07 MED ORDER — IPRATROPIUM-ALBUTEROL 0.5-2.5 (3) MG/3ML IN SOLN
3.0000 mL | Freq: Three times a day (TID) | RESPIRATORY_TRACT | Status: DC
  Administered 2020-10-08 (×2): 3 mL via RESPIRATORY_TRACT
  Filled 2020-10-07 (×2): qty 3

## 2020-10-07 MED ORDER — POTASSIUM CHLORIDE CRYS ER 20 MEQ PO TBCR
40.0000 meq | EXTENDED_RELEASE_TABLET | Freq: Once | ORAL | Status: AC
  Administered 2020-10-07: 40 meq via ORAL
  Filled 2020-10-07: qty 2

## 2020-10-07 NOTE — Progress Notes (Signed)
Patient is high fall risk. Pt is declining bed alarm. Pt educated on the importance of bed alarm and pt verbalized understanding but still does not want it on. He did state he will call if he has to get up. RN stressed importance of calling out to staff if patient needs to get up.

## 2020-10-07 NOTE — Progress Notes (Signed)
  Echocardiogram 2D Echocardiogram has been performed.  Gary Odonnell 10/07/2020, 8:40 AM

## 2020-10-07 NOTE — Care Management Obs Status (Signed)
[facility] NOTIFICATION   Patient Details  Name: STEFON RAMTHUN MRN: [id number] Date of Birth: 1932/09/16   Medicare Observation Status Notification Given:  Yes    Natasha Bence, LCSW 10/07/2020, 12:49 PM

## 2020-10-07 NOTE — Progress Notes (Signed)
PROGRESS NOTE    Gary Odonnell  ENI:[id number] DOB: 1932-05-18 DOA: 10/06/2020 PCP: Loman Brooklyn, FNP    Brief Narrative:  86 year old male with a history of COPD, combined CHF, atrial fibrillation, diabetes, hypertension, coronary artery disease, presents to the hospital with shortness of breath.  He was found to have decompensated CHF as well as a probable element of COPD.  He was admitted for IV diuresis as well as bronchodilators and steroids.   Assessment & Plan:   Active Problems:   HTN (hypertension)   Chronic obstructive pulmonary disease (HCC)   A-fib (HCC)   Tobacco abuse   CHF exacerbation (HCC)   Acute respiratory failure with hypoxia (HCC)   Acute on chronic combined systolic and diastolic CHF (congestive heart failure) (HCC)   DM type 2 (diabetes mellitus, type 2) (HCC)   CKD (chronic kidney disease) stage 3, GFR 30-59 ml/min (HCC)   Acute respiratory failure with hypoxia -Secondary to CHF versus COPD -Currently on 2 L of oxygen nasal cannula -Continue to wean off oxygen as tolerated  Acute on chronic combined CHF. -Echocardiogram reordered to update EF -He is already on a fairly good medication regimen with beta-blockers, Entresto -Continue on IV Lasix -Cardiology following, appreciate input  COPD -Patient does have a cough, wheezing shortness of breath -We will continue on bronchodilators, inhaled steroids -Continue antibiotics -Continue course of prednisone  Atrial fibrillation -Heart rate is currently stable -Continue anticoagulation with Eliquis  Diabetes -A1c 6.7 -Hold home dose of Farxiga -Continue on sliding scale insulin -Blood sugars remain stable  Hypertension -Continue home medications  Chronic kidney disease stage IIIa -Creatinine currently at baseline -Continue to monitor in the setting of diuresis   DVT prophylaxis:  apixaban (ELIQUIS) tablet 5 mg  Code Status: DNR Family Communication: Discussed with wife at  bedside Disposition Plan: Status is: Inpatient  Remains inpatient appropriate because:Inpatient level of care appropriate due to severity of illness   Dispo: The patient is from: Home              Anticipated d/c is to: Home              Anticipated d/c date is: 2 days              Patient currently is not medically stable to d/c.         Consultants:   Cardiology  Procedures:     Antimicrobials:   Ceftriaxone 11/12 >   Subjective: Continues to have cough, shortness of breath mildly improved, but not back to baseline.  Objective: Vitals:   10/07/20 0836 10/07/20 1320 10/07/20 1347 10/07/20 1510  BP: (!) 149/57 (!) 128/50  107/60  Pulse: (!) 55 70  74  Resp: 19   17  Temp: 98 F (36.7 C)   97.9 F (36.6 C)  TempSrc: Oral     SpO2: 100% 95% 91% 95%  Weight:      Height:        Intake/Output Summary (Last 24 hours) at 10/07/2020 2000 Last data filed at 10/07/2020 1714 Gross per 24 hour  Intake 320 ml  Output 1800 ml  Net -1480 ml   Filed Weights   10/06/20 0816 10/07/20 0500  Weight: 87.5 kg 87.9 kg    Examination:  General exam: Appears calm and comfortable  Respiratory system: Scattered rhonchi. Respiratory effort normal. Cardiovascular system: S1 & S2 heard, RRR. No JVD, murmurs, rubs, gallops or clicks. No pedal edema. Gastrointestinal system: Abdomen is nondistended, soft and  nontender. No organomegaly or masses felt. Normal bowel sounds heard. Central nervous system: Alert and oriented. No focal neurological deficits. Extremities: Symmetric 5 x 5 power. Skin: No rashes, lesions or ulcers Psychiatry: Judgement and insight appear normal. Mood & affect appropriate.     Data Reviewed: I have personally reviewed following labs and imaging studies  CBC: Recent Labs  Lab 10/06/20 0927 10/07/20 0645  WBC 6.9 8.5  NEUTROABS 5.2  --   HGB 12.6* 11.8*  HCT 41.0 37.7*  MCV 100.2* 98.4  PLT 156 132   Basic Metabolic Panel: Recent Labs    Lab 10/06/20 0927 10/07/20 0645  NA 143 140  K 3.7 3.2*  CL 102 101  CO2 32 28  GLUCOSE 200* 137*  BUN 16 19  CREATININE 1.33* 1.22  CALCIUM 9.6 9.5   GFR: Estimated Creatinine Clearance: 47.9 mL/min (by C-G formula based on SCr of 1.22 mg/dL). Liver Function Tests: Recent Labs  Lab 10/06/20 0927 10/07/20 0645  AST 11* 10*  ALT 14 12  ALKPHOS 62 54  BILITOT 2.1* 2.3*  PROT 7.0 6.4*  ALBUMIN 3.7 3.4*   No results for input(s): LIPASE, AMYLASE in the last 168 hours. No results for input(s): AMMONIA in the last 168 hours. Coagulation Profile: No results for input(s): INR, PROTIME in the last 168 hours. Cardiac Enzymes: No results for input(s): CKTOTAL, CKMB, CKMBINDEX, TROPONINI in the last 168 hours. BNP (last 3 results) No results for input(s): PROBNP in the last 8760 hours. HbA1C: Recent Labs    10/06/20 1855  HGBA1C 6.7*   CBG: Recent Labs  Lab 10/06/20 2036 10/07/20 0814 10/07/20 1112 10/07/20 1654  GLUCAP 158* 135* 211* 193*   Lipid Profile: No results for input(s): CHOL, HDL, LDLCALC, TRIG, CHOLHDL, LDLDIRECT in the last 72 hours. Thyroid Function Tests: No results for input(s): TSH, T4TOTAL, FREET4, T3FREE, THYROIDAB in the last 72 hours. Anemia Panel: No results for input(s): VITAMINB12, FOLATE, FERRITIN, TIBC, IRON, RETICCTPCT in the last 72 hours. Sepsis Labs: No results for input(s): PROCALCITON, LATICACIDVEN in the last 168 hours.  Recent Results (from the past 240 hour(s))  Respiratory Panel by RT PCR (Flu A&B, Covid) - Nasopharyngeal Swab     Status: None   Collection Time: 10/06/20 10:07 AM   Specimen: Nasopharyngeal Swab  Result Value Ref Range Status   SARS Coronavirus 2 by RT PCR NEGATIVE NEGATIVE Final    Comment: (NOTE) SARS-CoV-2 target nucleic acids are NOT DETECTED.  The SARS-CoV-2 RNA is generally detectable in upper respiratoy specimens during the acute phase of infection. The lowest concentration of SARS-CoV-2 viral copies  this assay can detect is 131 copies/mL. A negative result does not preclude SARS-Cov-2 infection and should not be used as the sole basis for treatment or other patient management decisions. A negative result may occur with  improper specimen collection/handling, submission of specimen other than nasopharyngeal swab, presence of viral mutation(s) within the areas targeted by this assay, and inadequate number of viral copies (<131 copies/mL). A negative result must be combined with clinical observations, patient history, and epidemiological information. The expected result is Negative.  Fact Sheet for Patients:  PinkCheek.be  Fact Sheet for Healthcare Providers:  GravelBags.it  This test is no t yet approved or cleared by the [place] FDA and  has been authorized for detection and/or diagnosis of SARS-CoV-2 by FDA under an Emergency Use Authorization (EUA). This EUA will remain  in effect (meaning this test can be used) for the duration of the  COVID-19 declaration under Section 564(b)(1) of the Act, 21 U.S.C. section 360bbb-3(b)(1), unless the authorization is terminated or revoked sooner.     Influenza A by PCR NEGATIVE NEGATIVE Final   Influenza B by PCR NEGATIVE NEGATIVE Final    Comment: (NOTE) The Xpert Xpress SARS-CoV-2/FLU/RSV assay is intended as an aid in  the diagnosis of influenza from Nasopharyngeal swab specimens and  should not be used as a sole basis for treatment. Nasal washings and  aspirates are unacceptable for Xpert Xpress SARS-CoV-2/FLU/RSV  testing.  Fact Sheet for Patients: PinkCheek.be  Fact Sheet for Healthcare Providers: GravelBags.it  This test is not yet approved or cleared by the [place] FDA and  has been authorized for detection and/or diagnosis of SARS-CoV-2 by  FDA under an Emergency Use Authorization (EUA). This EUA  will remain  in effect (meaning this test can be used) for the duration of the  Covid-19 declaration under Section 564(b)(1) of the Act, 21  U.S.C. section 360bbb-3(b)(1), unless the authorization is  terminated or revoked. Performed at [facility], [address], [place],  [address]          Radiology Studies: [facility] Chest [facility] 1 View  Result Date: 10/06/2020 CLINICAL DATA:  Shortness of breath EXAM: PORTABLE CHEST 1 VIEW COMPARISON:  01/15/2019 chest radiograph and prior. FINDINGS: Patchy bibasilar opacities. Small left predominant pleural effusions. No pneumothorax. Cardiomegaly. Post sternotomy sequela. Multilevel spondylosis. IMPRESSION: Patchy basilar opacities and small effusions, less conspicuous than prior exam. Cardiomegaly. Electronically Signed   By: Primitivo Gauze M.D.   On: 10/06/2020 09:28   ECHOCARDIOGRAM COMPLETE  Result Date: 10/07/2020    ECHOCARDIOGRAM REPORT   Patient Name:   BRYAM TABORDA Date of Exam: 10/07/2020 Medical Rec #:  [id number]      Height:       71.5 in Accession #:    [id number]     Weight:       193.8 lb Date of Birth:  06-07-1932     BSA:          2.091 m Patient Age:    49 years       BP:           149/57 mmHg Patient Gender: M              HR:           55 bpm. Exam Location:  [facility] Procedure: 2D Echo, Cardiac Doppler and Color Doppler Indications:    Ischemic Cardiomyopathy  History:        Patient has prior history of Echocardiogram examinations, most                 recent 04/09/2020. CHF and Cardiomyopathy, CAD, Prior CABG, COPD,                 PAD and Stroke, Arrythmias:Atrial Fibrillation,                 Signs/Symptoms:Shortness of Breath; Risk Factors:Hypertension                 and Diabetes.  Sonographer:    Dustin Flock RDCS Referring Phys: [place]  1. Akinesis of the basal inferior wall and apex; overall mild to moderate LV dysfunction.  2. Left ventricular ejection fraction, by estimation, is 40 to  45%. The left ventricle has mild to moderately decreased function. The left ventricle demonstrates regional wall motion abnormalities (see scoring diagram/findings for description). There is  mild left ventricular hypertrophy. Left ventricular diastolic function could not be evaluated. Elevated left atrial pressure.  3. Right ventricular systolic function is normal. The right ventricular size is normal. There is mildly elevated pulmonary artery systolic pressure.  4. Left atrial size was moderately dilated.  5. The mitral valve is normal in structure. Trivial mitral valve regurgitation. No evidence of mitral stenosis. Moderate mitral annular calcification.  6. The aortic valve is tricuspid. Aortic valve regurgitation is not visualized. Mild aortic valve sclerosis is present, with no evidence of aortic valve stenosis.  7. The inferior vena cava is normal in size with greater than 50% respiratory variability, suggesting right atrial pressure of 3 mmHg. FINDINGS  Left Ventricle: Left ventricular ejection fraction, by estimation, is 40 to 45%. The left ventricle has mild to moderately decreased function. The left ventricle demonstrates regional wall motion abnormalities. The left ventricular internal cavity size was normal in size. There is mild left ventricular hypertrophy. Left ventricular diastolic function could not be evaluated due to atrial fibrillation. Left ventricular diastolic function could not be evaluated. Elevated left atrial pressure. Right Ventricle: The right ventricular size is normal. No increase in right ventricular wall thickness. Right ventricular systolic function is normal. There is mildly elevated pulmonary artery systolic pressure. The tricuspid regurgitant velocity is 2.88  m/s, and with an assumed right atrial pressure of 3 mmHg, the estimated right ventricular systolic pressure is 49.6 mmHg. Left Atrium: Left atrial size was moderately dilated. Right Atrium: Right atrial size was normal in  size. Pericardium: There is no evidence of pericardial effusion. Mitral Valve: The mitral valve is normal in structure. Moderate mitral annular calcification. Trivial mitral valve regurgitation. No evidence of mitral valve stenosis. Tricuspid Valve: The tricuspid valve is normal in structure. Tricuspid valve regurgitation is trivial. No evidence of tricuspid stenosis. Aortic Valve: The aortic valve is tricuspid. Aortic valve regurgitation is not visualized. Mild aortic valve sclerosis is present, with no evidence of aortic valve stenosis. Aortic valve peak gradient measures 15.3 mmHg. Pulmonic Valve: The pulmonic valve was normal in structure. Pulmonic valve regurgitation is trivial. No evidence of pulmonic stenosis. Aorta: The aortic root is normal in size and structure. Venous: The inferior vena cava is normal in size with greater than 50% respiratory variability, suggesting right atrial pressure of 3 mmHg. IAS/Shunts: No atrial level shunt detected by color flow Doppler. Additional Comments: Akinesis of the basal inferior wall and apex; overall mild to moderate LV dysfunction.  LEFT VENTRICLE PLAX 2D LVIDd:         5.05 cm  Diastology LVIDs:         4.47 cm  LV e' medial:    4.90 cm/s LV PW:         1.28 cm  LV E/e' medial:  25.9 LV IVS:        1.25 cm  LV e' lateral:   5.00 cm/s LVOT diam:     2.00 cm  LV E/e' lateral: 25.4 LV SV:         57 LV SV Index:   27 LVOT Area:     3.14 cm  RIGHT VENTRICLE RV Basal diam:  2.84 cm RV S prime:     5.11 cm/s TAPSE (M-mode): 2.0 cm LEFT ATRIUM             Index       RIGHT ATRIUM           Index LA diam:  4.50 cm 2.15 cm/m  RA Area:     20.40 cm LA Vol (A2C):   96.9 ml 46.33 ml/m RA Volume:   62.50 ml  29.89 ml/m LA Vol (A4C):   78.8 ml 37.68 ml/m LA Biplane Vol: 91.9 ml 43.94 ml/m  AORTIC VALVE AV Area (Vmax): 1.44 cm AV Vmax:        195.50 cm/s AV Peak Grad:   15.3 mmHg LVOT Vmax:      89.40 cm/s LVOT Vmean:     60.000 cm/s LVOT VTI:       0.183 m  AORTA Ao  Root diam: 3.10 cm MITRAL VALVE                TRICUSPID VALVE MV Area (PHT): 3.39 cm     TR Peak grad:   33.2 mmHg MV Decel Time: 224 msec     TR Vmax:        288.00 cm/s MV E velocity: 127.00 cm/s MV A velocity: 39.30 cm/s   SHUNTS MV E/A ratio:  3.23         Systemic VTI:  0.18 m                             Systemic Diam: 2.00 cm Kirk Ruths MD Electronically signed by Kirk Ruths MD Signature Date/Time: 10/07/2020/1:33:12 PM    Final         Scheduled Meds: . apixaban  5 mg Oral BID  . aspirin EC  81 mg Oral Daily  . budesonide (PULMICORT) nebulizer solution  0.25 mg Nebulization BID  . carvedilol  6.25 mg Oral BID WC  . ezetimibe  10 mg Oral Daily  . furosemide  40 mg Intravenous BID  . guaiFENesin  600 mg Oral BID  . hydrALAZINE  37.5 mg Oral Q8H  . insulin aspart  0-15 Units Subcutaneous TID WC  . insulin aspart  0-5 Units Subcutaneous QHS  . ipratropium-albuterol  3 mL Nebulization Q6H  . isosorbide mononitrate  30 mg Oral Daily  . predniSONE  40 mg Oral Q breakfast  . sacubitril-valsartan  1 tablet Oral BID   Continuous Infusions: . cefTRIAXone (ROCEPHIN)  IV 1 g (10/07/20 1714)     LOS: 0 days    Time spent: 30mins    Kathie Dike, MD [facility]   If 7PM-7AM, please contact night-coverage [email or web address]  10/07/2020, 8:00 PM

## 2020-10-08 DIAGNOSIS — N1831 Chronic kidney disease, stage 3a: Secondary | ICD-10-CM | POA: Diagnosis not present

## 2020-10-08 DIAGNOSIS — J9601 Acute respiratory failure with hypoxia: Secondary | ICD-10-CM | POA: Diagnosis not present

## 2020-10-08 DIAGNOSIS — I5043 Acute on chronic combined systolic (congestive) and diastolic (congestive) heart failure: Secondary | ICD-10-CM | POA: Diagnosis not present

## 2020-10-08 DIAGNOSIS — I482 Chronic atrial fibrillation, unspecified: Secondary | ICD-10-CM | POA: Diagnosis not present

## 2020-10-08 DIAGNOSIS — I159 Secondary hypertension, unspecified: Secondary | ICD-10-CM

## 2020-10-08 LAB — BASIC METABOLIC PANEL
Anion gap: 10 (ref 5–15)
BUN: 29 mg/dL — ABNORMAL HIGH (ref 8–23)
CO2: 28 mmol/L (ref 22–32)
Calcium: 9.7 mg/dL (ref 8.9–10.3)
Chloride: 101 mmol/L (ref 98–111)
Creatinine, Ser: 1.3 mg/dL — ABNORMAL HIGH (ref 0.61–1.24)
GFR, Estimated: 54 mL/min — ABNORMAL LOW (ref >60)
Glucose, Bld: 155 mg/dL — ABNORMAL HIGH (ref 70–99)
Potassium: 3.7 mmol/L (ref 3.5–5.1)
Sodium: 139 mmol/L (ref 135–145)

## 2020-10-08 LAB — GLUCOSE, CAPILLARY
Glucose-Capillary: 136 mg/dL — ABNORMAL HIGH (ref 70–99)
Glucose-Capillary: 177 mg/dL — ABNORMAL HIGH (ref 70–99)

## 2020-10-08 MED ORDER — PREDNISONE 10 MG PO TABS
ORAL_TABLET | ORAL | 0 refills | Status: DC
Start: 2020-10-08 — End: 2020-10-23

## 2020-10-08 MED ORDER — ISOSORBIDE MONONITRATE ER 30 MG PO TB24
30.0000 mg | ORAL_TABLET | Freq: Every day | ORAL | 0 refills | Status: DC
Start: 2020-10-09 — End: 2020-12-07

## 2020-10-08 MED ORDER — ALBUTEROL SULFATE (2.5 MG/3ML) 0.083% IN NEBU: 2.5000 mg | INHALATION_SOLUTION | Freq: Four times a day (QID) | RESPIRATORY_TRACT | 12 refills | Status: AC | PRN

## 2020-10-08 MED ORDER — FUROSEMIDE 40 MG PO TABS
60.0000 mg | ORAL_TABLET | Freq: Every day | ORAL | 1 refills | Status: DC
Start: 2020-10-08 — End: 2020-11-30

## 2020-10-08 MED ORDER — GUAIFENESIN ER 600 MG PO TB12
600.0000 mg | ORAL_TABLET | Freq: Two times a day (BID) | ORAL | 0 refills | Status: DC
Start: 2020-10-08 — End: 2020-12-07

## 2020-10-08 MED ORDER — AMOXICILLIN-POT CLAVULANATE 875-125 MG PO TABS: 1.0000 | ORAL_TABLET | Freq: Two times a day (BID) | ORAL | 0 refills | Status: AC

## 2020-10-08 NOTE — Progress Notes (Signed)
Patient states understanding of discharge instructions.  

## 2020-10-08 NOTE — Discharge Summary (Signed)
Physician Discharge Summary  Gary Odonnell FTD:[id number] DOB: 02-07-1932 DOA: 10/06/2020  PCP: Loman Brooklyn, FNP  Admit date: 10/06/2020 Discharge date: 10/08/2020  Admitted From: Home Disposition: Home  Recommendations for Outpatient Follow-up:  1. Follow up with PCP in 1-2 weeks 2. Please obtain BMP/CBC in one week 3. Follow-up with cardiology as an outpatient  Discharge Condition: Stable CODE STATUS: DNR Diet recommendation: Heart healthy, carb modified  Brief/Interim Summary: 86 year old male with a history of COPD, combined CHF, atrial fibrillation, diabetes, hypertension, coronary artery disease, presents to the hospital with shortness of breath.  He was found to have decompensated CHF as well as a probable element of COPD.  He was admitted for IV diuresis as well as bronchodilators and steroids.  Discharge Diagnoses:  Active Problems:   HTN (hypertension)   Chronic obstructive pulmonary disease (HCC)   A-fib (HCC)   Tobacco abuse   CHF exacerbation (HCC)   Acute respiratory failure with hypoxia (HCC)   Acute on chronic combined systolic and diastolic CHF (congestive heart failure) (HCC)   DM type 2 (diabetes mellitus, type 2) (HCC)   CKD (chronic kidney disease) stage 3, GFR 30-59 ml/min (HCC)  Acute respiratory failure with hypoxia -Secondary to CHF and COPD -Patient was requiring 2 L of oxygen, but has since been weaned down to room air -He was ambulated and did not develop any hypoxia  Acute on chronic combined CHF. -Echocardiogram shows ejection fraction of 40 to 45% which is mildly improved from 03/2020 -He is already on a fairly good medication regimen with beta-blockers, Entresto -He was treated with intravenous Lasix -Cardiology following, appreciate input -Creatinine is not trending up, will transition back to oral diuretics -Plan to follow-up with cardiology in the next 2 weeks  COPD -Patient does have a cough, wheezing shortness of breath -He  was treated with intravenous steroids, bronchodilators, antibiotics -Overall cough has improved -He has mild wheezing, but overall shortness of breath is better -He has been transitioned to prednisone taper -He reports having a nebulizer at home  Atrial fibrillation -Heart rate is currently stable -Continue anticoagulation with Eliquis  Diabetes -A1c 6.7 -He did not take any medications at home -Blood sugars have been stable  Hypertension -Continue home medications  Chronic kidney disease stage IIIa -Creatinine currently at baseline -Mild uptrend of creatinine with diuresis -This can be followed up as an outpatient  Discharge Instructions  Discharge Instructions    Diet - low sodium heart healthy   Complete by: As directed    Increase activity slowly   Complete by: As directed      Allergies as of 10/08/2020      Reactions   Statins Other (See Comments)   Intolerance per outpt Cardiology notes      Medication List    STOP taking these medications   EYE DROPS OP     TAKE these medications   albuterol 108 (90 Base) MCG/ACT inhaler Commonly known as: VENTOLIN HFA Inhale 2 puffs into the lungs every 6 (six) hours as needed for wheezing or shortness of breath. What changed: Another medication with the same name was added. Make sure you understand how and when to take each.   albuterol (2.5 MG/3ML) 0.083% nebulizer solution Commonly known as: PROVENTIL Take 3 mLs (2.5 mg total) by nebulization every 6 (six) hours as needed for shortness of breath. What changed: You were already taking a medication with the same name, and this prescription was added. Make sure you understand how and when  to take each.   amoxicillin-clavulanate 875-125 MG tablet Commonly known as: Augmentin Take 1 tablet by mouth 2 (two) times daily for 3 days.   apixaban 5 MG Tabs tablet Commonly known as: Eliquis Take 1 tablet (5 mg total) by mouth 2 (two) times daily.   aspirin 81 MG EC  tablet Take 1 tablet (81 mg total) by mouth daily.   Breztri Aerosphere 160-9-4.8 MCG/ACT Aero Generic drug: Budeson-Glycopyrrol-Formoterol Inhale 2 puffs into the lungs in the morning and at bedtime.   carvedilol 6.25 MG tablet Commonly known as: COREG Take 1 tablet (6.25 mg total) by mouth 2 (two) times daily with a meal.   dapagliflozin propanediol 10 MG Tabs tablet Commonly known as: Farxiga Take 1 tablet (10 mg total) by mouth daily before breakfast.   ezetimibe 10 MG tablet Commonly known as: ZETIA Take 1 tablet (10 mg total) by mouth daily.   furosemide 40 MG tablet Commonly known as: LASIX Take 1.5 tablets (60 mg total) by mouth daily. What changed:   how much to take  how to take this  when to take this  additional instructions   guaiFENesin 600 MG 12 hr tablet Commonly known as: MUCINEX Take 1 tablet (600 mg total) by mouth 2 (two) times daily.   hydrALAZINE 25 MG tablet Commonly known as: APRESOLINE Take 1.5 tablets (37.5 mg total) by mouth every 8 (eight) hours.   isosorbide mononitrate 30 MG 24 hr tablet Commonly known as: IMDUR Take 1 tablet (30 mg total) by mouth daily. Start taking on: October 09, 2020   nitroGLYCERIN 0.3 MG SL tablet Commonly known as: Nitrostat Place 1 tablet (0.3 mg total) under the tongue every 5 (five) minutes as needed for chest pain.   Potassium 99 MG Tabs Take 1 tablet by mouth daily.   predniSONE 10 MG tablet Commonly known as: DELTASONE Take 40mg  po daily for 2 days then 30mg  daily for 2 days then 20mg  daily for 2 days then 10mg  daily for 2 days then stop   sacubitril-valsartan 49-51 MG Commonly known as: ENTRESTO Take 1 tablet by mouth 2 (two) times daily.       Allergies  Allergen Reactions  . Statins Other (See Comments)    Intolerance per outpt Cardiology notes    Consultations:  Cardiology   Procedures/Studies: DG Chest Port 1 View  Result Date: 10/06/2020 CLINICAL DATA:  Shortness of breath  EXAM: PORTABLE CHEST 1 VIEW COMPARISON:  01/15/2019 chest radiograph and prior. FINDINGS: Patchy bibasilar opacities. Small left predominant pleural effusions. No pneumothorax. Cardiomegaly. Post sternotomy sequela. Multilevel spondylosis. IMPRESSION: Patchy basilar opacities and small effusions, less conspicuous than prior exam. Cardiomegaly. Electronically Signed   By: Primitivo Gauze M.D.   On: 10/06/2020 09:28   ECHOCARDIOGRAM COMPLETE  Result Date: 10/07/2020    ECHOCARDIOGRAM REPORT   Patient Name:   Gary Odonnell Date of Exam: 10/07/2020 Medical Rec #:  [id number]      Height:       71.5 in Accession #:    [id number]     Weight:       193.8 lb Date of Birth:  1932/03/25     BSA:          2.091 m Patient Age:    47 years       BP:           149/57 mmHg Patient Gender: M              HR:  55 bpm. Exam Location:  [facility] Procedure: 2D Echo, Cardiac Doppler and Color Doppler Indications:    Ischemic Cardiomyopathy  History:        Patient has prior history of Echocardiogram examinations, most                 recent 04/09/2020. CHF and Cardiomyopathy, CAD, Prior CABG, COPD,                 PAD and Stroke, Arrythmias:Atrial Fibrillation,                 Signs/Symptoms:Shortness of Breath; Risk Factors:Hypertension                 and Diabetes.  Sonographer:    Dustin Flock RDCS Referring Phys: [place]  1. Akinesis of the basal inferior wall and apex; overall mild to moderate LV dysfunction.  2. Left ventricular ejection fraction, by estimation, is 40 to 45%. The left ventricle has mild to moderately decreased function. The left ventricle demonstrates regional wall motion abnormalities (see scoring diagram/findings for description). There is  mild left ventricular hypertrophy. Left ventricular diastolic function could not be evaluated. Elevated left atrial pressure.  3. Right ventricular systolic function is normal. The right ventricular size is normal. There is  mildly elevated pulmonary artery systolic pressure.  4. Left atrial size was moderately dilated.  5. The mitral valve is normal in structure. Trivial mitral valve regurgitation. No evidence of mitral stenosis. Moderate mitral annular calcification.  6. The aortic valve is tricuspid. Aortic valve regurgitation is not visualized. Mild aortic valve sclerosis is present, with no evidence of aortic valve stenosis.  7. The inferior vena cava is normal in size with greater than 50% respiratory variability, suggesting right atrial pressure of 3 mmHg. FINDINGS  Left Ventricle: Left ventricular ejection fraction, by estimation, is 40 to 45%. The left ventricle has mild to moderately decreased function. The left ventricle demonstrates regional wall motion abnormalities. The left ventricular internal cavity size was normal in size. There is mild left ventricular hypertrophy. Left ventricular diastolic function could not be evaluated due to atrial fibrillation. Left ventricular diastolic function could not be evaluated. Elevated left atrial pressure. Right Ventricle: The right ventricular size is normal. No increase in right ventricular wall thickness. Right ventricular systolic function is normal. There is mildly elevated pulmonary artery systolic pressure. The tricuspid regurgitant velocity is 2.88  m/s, and with an assumed right atrial pressure of 3 mmHg, the estimated right ventricular systolic pressure is 82.9 mmHg. Left Atrium: Left atrial size was moderately dilated. Right Atrium: Right atrial size was normal in size. Pericardium: There is no evidence of pericardial effusion. Mitral Valve: The mitral valve is normal in structure. Moderate mitral annular calcification. Trivial mitral valve regurgitation. No evidence of mitral valve stenosis. Tricuspid Valve: The tricuspid valve is normal in structure. Tricuspid valve regurgitation is trivial. No evidence of tricuspid stenosis. Aortic Valve: The aortic valve is tricuspid.  Aortic valve regurgitation is not visualized. Mild aortic valve sclerosis is present, with no evidence of aortic valve stenosis. Aortic valve peak gradient measures 15.3 mmHg. Pulmonic Valve: The pulmonic valve was normal in structure. Pulmonic valve regurgitation is trivial. No evidence of pulmonic stenosis. Aorta: The aortic root is normal in size and structure. Venous: The inferior vena cava is normal in size with greater than 50% respiratory variability, suggesting right atrial pressure of 3 mmHg. IAS/Shunts: No atrial level shunt detected by color flow Doppler. Additional Comments: Akinesis  of the basal inferior wall and apex; overall mild to moderate LV dysfunction.  LEFT VENTRICLE PLAX 2D LVIDd:         5.05 cm  Diastology LVIDs:         4.47 cm  LV e' medial:    4.90 cm/s LV PW:         1.28 cm  LV E/e' medial:  25.9 LV IVS:        1.25 cm  LV e' lateral:   5.00 cm/s LVOT diam:     2.00 cm  LV E/e' lateral: 25.4 LV SV:         57 LV SV Index:   27 LVOT Area:     3.14 cm  RIGHT VENTRICLE RV Basal diam:  2.84 cm RV S prime:     5.11 cm/s TAPSE (M-mode): 2.0 cm LEFT ATRIUM             Index       RIGHT ATRIUM           Index LA diam:        4.50 cm 2.15 cm/m  RA Area:     20.40 cm LA Vol (A2C):   96.9 ml 46.33 ml/m RA Volume:   62.50 ml  29.89 ml/m LA Vol (A4C):   78.8 ml 37.68 ml/m LA Biplane Vol: 91.9 ml 43.94 ml/m  AORTIC VALVE AV Area (Vmax): 1.44 cm AV Vmax:        195.50 cm/s AV Peak Grad:   15.3 mmHg LVOT Vmax:      89.40 cm/s LVOT Vmean:     60.000 cm/s LVOT VTI:       0.183 m  AORTA Ao Root diam: 3.10 cm MITRAL VALVE                TRICUSPID VALVE MV Area (PHT): 3.39 cm     TR Peak grad:   33.2 mmHg MV Decel Time: 224 msec     TR Vmax:        288.00 cm/s MV E velocity: 127.00 cm/s MV A velocity: 39.30 cm/s   SHUNTS MV E/A ratio:  3.23         Systemic VTI:  0.18 m                             Systemic Diam: 2.00 cm Kirk Ruths MD Electronically signed by Kirk Ruths MD Signature  Date/Time: 10/07/2020/1:33:12 PM    Final        Subjective: Shortness of breath is better.  Able to ambulate without difficulty.  Discharge Exam: Vitals:   10/08/20 0458 10/08/20 0839 10/08/20 1305 10/08/20 1328  BP: (!) 127/58  (!) 125/53   Pulse: 72  67   Resp: 18  20   Temp: 98.2 F (36.8 C)  98 F (36.7 C)   TempSrc:      SpO2: 96% 97% 98% 91%  Weight:      Height:        General: Pt is alert, awake, not in acute distress Cardiovascular: RRR, S1/S2 +, no rubs, no gallops Respiratory: Normal respiratory effort.  Mild wheeze bilaterally Abdominal: Soft, NT, ND, bowel sounds + Extremities: no edema, no cyanosis    The results of significant diagnostics from this hospitalization (including imaging, microbiology, ancillary and laboratory) are listed below for reference.     Microbiology: Recent Results (from the past 240 hour(s))  Respiratory Panel  by RT PCR (Flu A&B, Covid) - Nasopharyngeal Swab     Status: None   Collection Time: 10/06/20 10:07 AM   Specimen: Nasopharyngeal Swab  Result Value Ref Range Status   SARS Coronavirus 2 by RT PCR NEGATIVE NEGATIVE Final    Comment: (NOTE) SARS-CoV-2 target nucleic acids are NOT DETECTED.  The SARS-CoV-2 RNA is generally detectable in upper respiratoy specimens during the acute phase of infection. The lowest concentration of SARS-CoV-2 viral copies this assay can detect is 131 copies/mL. A negative result does not preclude SARS-Cov-2 infection and should not be used as the sole basis for treatment or other patient management decisions. A negative result may occur with  improper specimen collection/handling, submission of specimen other than nasopharyngeal swab, presence of viral mutation(s) within the areas targeted by this assay, and inadequate number of viral copies (<131 copies/mL). A negative result must be combined with clinical observations, patient history, and epidemiological information. The expected result  is Negative.  Fact Sheet for Patients:  PinkCheek.be  Fact Sheet for Healthcare Providers:  GravelBags.it  This test is no t yet approved or cleared by the [place] FDA and  has been authorized for detection and/or diagnosis of SARS-CoV-2 by FDA under an Emergency Use Authorization (EUA). This EUA will remain  in effect (meaning this test can be used) for the duration of the COVID-19 declaration under Section 564(b)(1) of the Act, 21 U.S.C. section 360bbb-3(b)(1), unless the authorization is terminated or revoked sooner.     Influenza A by PCR NEGATIVE NEGATIVE Final   Influenza B by PCR NEGATIVE NEGATIVE Final    Comment: (NOTE) The Xpert Xpress SARS-CoV-2/FLU/RSV assay is intended as an aid in  the diagnosis of influenza from Nasopharyngeal swab specimens and  should not be used as a sole basis for treatment. Nasal washings and  aspirates are unacceptable for Xpert Xpress SARS-CoV-2/FLU/RSV  testing.  Fact Sheet for Patients: PinkCheek.be  Fact Sheet for Healthcare Providers: GravelBags.it  This test is not yet approved or cleared by the [place] FDA and  has been authorized for detection and/or diagnosis of SARS-CoV-2 by  FDA under an Emergency Use Authorization (EUA). This EUA will remain  in effect (meaning this test can be used) for the duration of the  Covid-19 declaration under Section 564(b)(1) of the Act, 21  U.S.C. section 360bbb-3(b)(1), unless the authorization is  terminated or revoked. Performed at [facility], [address] [place], [place] [address]      Labs: BNP (last 3 results) Recent Labs    04/08/20 2315 10/06/20 0927  BNP 2,748.2* 8,299.3*   Basic Metabolic Panel: Recent Labs  Lab 10/06/20 0927 10/07/20 0645 10/08/20 0606  NA 143 140 139  K 3.7 3.2* 3.7  CL 102 101 101  CO2 32 28 28  GLUCOSE 200* 137* 155*   BUN 16 19 29*  CREATININE 1.33* 1.22 1.30*  CALCIUM 9.6 9.5 9.7   Liver Function Tests: Recent Labs  Lab 10/06/20 0927 10/07/20 0645  AST 11* 10*  ALT 14 12  ALKPHOS 62 54  BILITOT 2.1* 2.3*  PROT 7.0 6.4*  ALBUMIN 3.7 3.4*   No results for input(s): LIPASE, AMYLASE in the last 168 hours. No results for input(s): AMMONIA in the last 168 hours. CBC: Recent Labs  Lab 10/06/20 0927 10/07/20 0645  WBC 6.9 8.5  NEUTROABS 5.2  --   HGB 12.6* 11.8*  HCT 41.0 37.7*  MCV 100.2* 98.4  PLT 156 154   Cardiac Enzymes:  No results for input(s): CKTOTAL, CKMB, CKMBINDEX, TROPONINI in the last 168 hours. BNP: Invalid input(s): POCBNP CBG: Recent Labs  Lab 10/07/20 1112 10/07/20 1654 10/07/20 2030 10/08/20 0729 10/08/20 1132  GLUCAP 211* 193* 195* 136* 177*   D-Dimer No results for input(s): DDIMER in the last 72 hours. Hgb A1c Recent Labs    10/06/20 1855  HGBA1C 6.7*   Lipid Profile No results for input(s): CHOL, HDL, LDLCALC, TRIG, CHOLHDL, LDLDIRECT in the last 72 hours. Thyroid function studies No results for input(s): TSH, T4TOTAL, T3FREE, THYROIDAB in the last 72 hours.  Invalid input(s): FREET3 Anemia work up No results for input(s): VITAMINB12, FOLATE, FERRITIN, TIBC, IRON, RETICCTPCT in the last 72 hours. Urinalysis    Component Value Date/Time   COLORURINE YELLOW 10/06/2020 1027   APPEARANCEUR CLEAR 10/06/2020 1027   LABSPEC 1.019 10/06/2020 1027   PHURINE 6.0 10/06/2020 1027   GLUCOSEU 50 (A) 10/06/2020 1027   HGBUR SMALL (A) 10/06/2020 1027   BILIRUBINUR NEGATIVE 10/06/2020 1027   KETONESUR NEGATIVE 10/06/2020 1027   PROTEINUR 100 (A) 10/06/2020 1027   NITRITE NEGATIVE 10/06/2020 1027   LEUKOCYTESUR MODERATE (A) 10/06/2020 1027   Sepsis Labs Invalid input(s): PROCALCITONIN,  WBC,  LACTICIDVEN Microbiology Recent Results (from the past 240 hour(s))  Respiratory Panel by RT PCR (Flu A&B, Covid) - Nasopharyngeal Swab     Status: None    Collection Time: 10/06/20 10:07 AM   Specimen: Nasopharyngeal Swab  Result Value Ref Range Status   SARS Coronavirus 2 by RT PCR NEGATIVE NEGATIVE Final    Comment: (NOTE) SARS-CoV-2 target nucleic acids are NOT DETECTED.  The SARS-CoV-2 RNA is generally detectable in upper respiratoy specimens during the acute phase of infection. The lowest concentration of SARS-CoV-2 viral copies this assay can detect is 131 copies/mL. A negative result does not preclude SARS-Cov-2 infection and should not be used as the sole basis for treatment or other patient management decisions. A negative result may occur with  improper specimen collection/handling, submission of specimen other than nasopharyngeal swab, presence of viral mutation(s) within the areas targeted by this assay, and inadequate number of viral copies (<131 copies/mL). A negative result must be combined with clinical observations, patient history, and epidemiological information. The expected result is Negative.  Fact Sheet for Patients:  PinkCheek.be  Fact Sheet for Healthcare Providers:  GravelBags.it  This test is no t yet approved or cleared by the [place] FDA and  has been authorized for detection and/or diagnosis of SARS-CoV-2 by FDA under an Emergency Use Authorization (EUA). This EUA will remain  in effect (meaning this test can be used) for the duration of the COVID-19 declaration under Section 564(b)(1) of the Act, 21 U.S.C. section 360bbb-3(b)(1), unless the authorization is terminated or revoked sooner.     Influenza A by PCR NEGATIVE NEGATIVE Final   Influenza B by PCR NEGATIVE NEGATIVE Final    Comment: (NOTE) The Xpert Xpress SARS-CoV-2/FLU/RSV assay is intended as an aid in  the diagnosis of influenza from Nasopharyngeal swab specimens and  should not be used as a sole basis for treatment. Nasal washings and  aspirates are unacceptable for Xpert  Xpress SARS-CoV-2/FLU/RSV  testing.  Fact Sheet for Patients: PinkCheek.be  Fact Sheet for Healthcare Providers: GravelBags.it  This test is not yet approved or cleared by the [place] FDA and  has been authorized for detection and/or diagnosis of SARS-CoV-2 by  FDA under an Emergency Use Authorization (EUA). This EUA will remain  in effect (meaning  this test can be used) for the duration of the  Covid-19 declaration under Section 564(b)(1) of the Act, 21  U.S.C. section 360bbb-3(b)(1), unless the authorization is  terminated or revoked. Performed at [facility], [address], [place], [place] [address]      Time coordinating discharge: 63mins  SIGNED:   Kathie Dike, MD  [facility] 10/08/2020, 6:25 PM   If 7PM-7AM, please contact night-coverage [email or web address]

## 2020-10-23 ENCOUNTER — Encounter: Payer: Self-pay | Admitting: Cardiology

## 2020-10-23 ENCOUNTER — Other Ambulatory Visit: Payer: Self-pay

## 2020-10-23 ENCOUNTER — Ambulatory Visit (INDEPENDENT_AMBULATORY_CARE_PROVIDER_SITE_OTHER): Payer: Medicare Other | Admitting: Cardiology

## 2020-10-23 VITALS — BP 120/64 | HR 56 | Ht 71.5 in | Wt 199.0 lb

## 2020-10-23 DIAGNOSIS — I4891 Unspecified atrial fibrillation: Secondary | ICD-10-CM | POA: Diagnosis not present

## 2020-10-23 DIAGNOSIS — I2510 Atherosclerotic heart disease of native coronary artery without angina pectoris: Secondary | ICD-10-CM | POA: Diagnosis not present

## 2020-10-23 DIAGNOSIS — I5022 Chronic systolic (congestive) heart failure: Secondary | ICD-10-CM | POA: Diagnosis not present

## 2020-10-23 NOTE — Patient Instructions (Signed)
Your physician recommends that you schedule a follow-up appointment in: [place]  Your physician recommends that you continue on your current medications as directed. Please refer to the Current Medication list given to you today.  Thank you for choosing [facility]!!

## 2020-10-23 NOTE — Progress Notes (Signed)
Clinical Summary Gary Odonnell is a 86 y.o.male seen today for follow up of the following medical problems. This is a focsued visit for recent admission with CHF exacerbation.   1. CAD/ICM/Chronic systolic heart failure - history of prior CABG 2003 in [place]  - 09/2013 admission to [facility] with decompensated heart failure. Workup included an echo which showed an LVEF of 15%. He was transferred to [facility] for further management.  - cath 09/2013 showed LM 30%, LAD 100% proximal, LCX 50% ostial, RCA 100% mid with distal vessel filling with right to right and left to right collaterals. LIMA-LAD patent, graft from [place] to OM1, OM2, PDA occluded. No PCI targets, recommendations for medical management. RHC mean PA 43, wedge 22, CI 1.74.  - repeat echo 04/2014 shows LVEF improved to 40-45%. - 01/2018 echo LVEF 35-40%, grade II diastolic dysfunction.       -admit 03/2020 with NSTEMI,actually presented with fall out of bed and N/V, no chest pain 03/2020 cath: severe 3 vessel disease, 1/4 patent grafts as reported below. Overall stable disease, recs for medical therapy - 03/2020 echo LVEF 35-40%, grade III DDx, PASP 56 - no recent symtoms since discahrge - no recent edema. Chronc stable SOB/DOE. No chest pain.     - admit 09/2020 with COPD and CHF exacerbation. Diuresed with IV lasix -09/2020 echo LVEF 40-45% - since discharge no breathing issues - no LE edema. Compliant with meds. Home weight 189-190lbs  2. Afib - new diagnosis during 12/27/2018 ER visit. Eliquis started in clniic 12/29/17 - CHADS2Vasc score is (age x2, CHF, HTN, stroke x2, CAD) is 7   - no recent palpitations.  - compliant with meds       Past Medical History:  Diagnosis Date  . Atrial fibrillation ([place])   . CHF (congestive heart failure) ([place])    a. EF 40-45% in 2015, at EF 30-35% by echo in 12/2018  . COPD (chronic obstructive pulmonary disease) ([place])   . Coronary artery disease     a. s/p CABG with most recent cath in 2014 showing patent LIMA-LAD, occluded Radial to OM1-OM2-PDA and occluded RCA with L--> R collaterals  . Diabetic nephropathy ([place])   . Essential hypertension   . Hyperlipidemia   . PAD (peripheral artery disease) ([place])   . Pneumonia 09/2013  . Primary cancer of left lower lobe of lung ([place]) 02/02/2015  . Radiation 02/2015  . Spinal stenosis   . Stroke ([place]) 11/2008  . Type 2 diabetes mellitus ([facility])      Allergies  Allergen Reactions  . Statins Other (See Comments)    Intolerance per outpt Cardiology notes     Current Outpatient Medications  Medication Sig Dispense Refill  . albuterol (PROVENTIL) (2.5 MG/3ML) 0.083% nebulizer solution Take 3 mLs (2.5 mg total) by nebulization every 6 (six) hours as needed for shortness of breath. 75 mL 12  . albuterol (VENTOLIN HFA) 108 (90 Base) MCG/ACT inhaler Inhale 2 puffs into the lungs every 6 (six) hours as needed for wheezing or shortness of breath. 8 g 0  . apixaban (ELIQUIS) 5 MG TABS tablet Take 1 tablet (5 mg total) by mouth 2 (two) times daily. 180 tablet 1  . aspirin 81 MG EC tablet Take 1 tablet (81 mg total) by mouth daily. 30 tablet 0  . Budeson-Glycopyrrol-Formoterol (BREZTRI AEROSPHERE) 160-9-4.8 MCG/ACT AERO Inhale 2 puffs into the lungs in the morning and at bedtime. 10.7 g 3  . carvedilol (COREG) 6.25 MG  tablet Take 1 tablet (6.25 mg total) by mouth 2 (two) times daily with a meal. 180 tablet 1  . dapagliflozin propanediol (FARXIGA) 10 MG TABS tablet Take 1 tablet (10 mg total) by mouth daily before breakfast. (Patient not taking: Reported on 10/06/2020) 90 tablet 1  . ezetimibe (ZETIA) 10 MG tablet Take 1 tablet (10 mg total) by mouth daily. 90 tablet 1  . furosemide (LASIX) 40 MG tablet Take 1.5 tablets (60 mg total) by mouth daily. 120 tablet 1  . guaiFENesin (MUCINEX) 600 MG 12 hr tablet Take 1 tablet (600 mg total) by mouth 2 (two) times daily. 30 tablet 0  . hydrALAZINE (APRESOLINE) 25  MG tablet Take 1.5 tablets (37.5 mg total) by mouth every 8 (eight) hours. 405 tablet 1  . isosorbide mononitrate (IMDUR) 30 MG 24 hr tablet Take 1 tablet (30 mg total) by mouth daily. 30 tablet 0  . nitroGLYCERIN (NITROSTAT) 0.3 MG SL tablet Place 1 tablet (0.3 mg total) under the tongue every 5 (five) minutes as needed for chest pain. 100 tablet 3  . Potassium 99 MG TABS Take 1 tablet by mouth daily.     . predniSONE (DELTASONE) 10 MG tablet Take 40mg  po daily for 2 days then 30mg  daily for 2 days then 20mg  daily for 2 days then 10mg  daily for 2 days then stop 20 tablet 0  . sacubitril-valsartan (ENTRESTO) 49-51 MG Take 1 tablet by mouth 2 (two) times daily. 180 tablet 1   No current facility-administered medications for this visit.     Past Surgical History:  Procedure Laterality Date  . Angioplasty to Left femoral artery    . CARPAL TUNNEL RELEASE Left 05/11/2014   Procedure: LEFT CARPAL TUNNEL RELEASE;  Surgeon: Carole Civil, MD;  Location: [facility];  Service: Orthopedics;  Laterality: Left;  . COLONOSCOPY N/A 01/24/2015   Procedure: COLONOSCOPY;  Surgeon: Danie Binder, MD;  Location: [facility];  Service: Endoscopy;  Laterality: N/A;  130  . CORONARY ARTERY BYPASS GRAFT     x4  . FEMORAL-POPLITEAL BYPASS GRAFT Right 10/23/2015   Procedure: BYPASS GRAFT FEMORAL TO BELOW KNEE POPLITEAL ARTERY USING RIGHT NON-REVERSED GREATER SAPPHENOUS VEIN;  Surgeon: [name], MD;  Location: [place];  Service: Vascular;  Laterality: Right;  . INTRAOPERATIVE ARTERIOGRAM Right 10/23/2015   Procedure: INTRA OPERATIVE ARTERIOGRAM - RIGHT LOWER LEG;  Surgeon: [name], MD;  Location: [facility];  Service: Vascular;  Laterality: Right;  . KNEE ARTHROSCOPY Right   . LEFT AND RIGHT HEART CATHETERIZATION WITH CORONARY ANGIOGRAM N/A 09/29/2013   Procedure: LEFT AND RIGHT HEART CATHETERIZATION WITH CORONARY ANGIOGRAM;  Surgeon: Burnell Blanks, MD;  Location: [facility] CATH LAB;  Service:  Cardiovascular;  Laterality: N/A;  . LEFT HEART CATH AND CORS/GRAFTS ANGIOGRAPHY N/A 04/11/2020   Procedure: LEFT HEART CATH AND CORS/GRAFTS ANGIOGRAPHY;  Surgeon: Burnell Blanks, MD;  Location: [facility] CV LAB;  Service: Cardiovascular;  Laterality: N/A;  . PERIPHERAL VASCULAR CATHETERIZATION N/A 04/21/2015   Procedure: Abdominal Aortogram w/Lower Extremity;  Surgeon: [name], MD;  Location: [place] CV LAB;  Service: Cardiovascular;  Laterality: N/A;  . Stent to right femoral artery    . TRIGGER FINGER RELEASE Bilateral   . VEIN HARVEST Right 10/23/2015   Procedure: [place];  Surgeon: [name], MD;  Location: [place];  Service: Vascular;  Laterality: Right;     Allergies  Allergen Reactions  . Statins Other (See  Comments)    Intolerance per outpt Cardiology notes      Family History  Problem Relation Age of Onset  . Heart attack Father   . Hyperlipidemia Father   . Diabetes Mother   . Diabetes Brother   . Heart Problems Brother   . Colon cancer Neg Hx      Social History Gary Odonnell reports that he has been smoking cigars. He started smoking about 70 years ago. He has a 30.00 pack-year smoking history. He has never used smokeless tobacco. Gary Odonnell reports no history of alcohol use.   Review of Systems CONSTITUTIONAL: No weight loss, fever, chills, weakness or fatigue.  HEENT: Eyes: No visual loss, blurred vision, double vision or yellow sclerae.No hearing loss, sneezing, congestion, runny nose or sore throat.  SKIN: No rash or itching.  CARDIOVASCULAR: per hpi RESPIRATORY: No shortness of breath, cough or sputum.  GASTROINTESTINAL: No anorexia, nausea, vomiting or diarrhea. No abdominal pain or blood.  GENITOURINARY: No burning on urination, no polyuria NEUROLOGICAL: No headache, dizziness, syncope, paralysis, ataxia, numbness or tingling in the extremities. No change in bowel or bladder control.    MUSCULOSKELETAL: No muscle, back pain, joint pain or stiffness.  LYMPHATICS: No enlarged nodes. No history of splenectomy.  PSYCHIATRIC: No history of depression or anxiety.  ENDOCRINOLOGIC: No reports of sweating, cold or heat intolerance. No polyuria or polydipsia.  Marland Kitchen   Physical Examination Today's Vitals   10/23/20 1424  BP: 120/64  Pulse: (!) 56  SpO2: 95%  Weight: 199 lb (90.3 kg)  Height: 5' 11.5" (1.816 m)   Body mass index is 27.37 kg/m.  Gen: resting comfortably, no acute distress HEENT: no scleral icterus, pupils equal round and reactive, no palptable cervical adenopathy,  CV: RRR, no m/r/g, no jvd Resp: Clear to auscultation bilaterally GI: abdomen is soft, non-tender, non-distended, normal bowel sounds, no hepatosplenomegaly MSK: extremities are warm, no edema.  Skin: warm, no rash Neuro:  no focal deficits Psych: appropriate affect   Diagnostic Studies 09/29/13 Cath Hemodynamic Findings: Ao: 162/70  LV: 163/19/29  RA: 12 RV: 66/7/16  PA: 67/26 (mean 43)  PCWP: 22  Fick Cardiac Output: 3.5 L/min  Fick Cardiac Index: 1.74 L/min/m2  Central Aortic Saturation: 91%  Pulmonary Artery Saturation: 50%  Angiographic Findings:  Left main: Distal 30% stenosis.  Left Anterior Descending Artery: 100% proximal occlusion. Proximal, mid and distal vessel fills from the patent IMA graft.  Circumflex Artery: Large caliber vessel with large intermediate Becca Bayne. Diffuse non-obstructive plaque in the large bifurcating intermediate Koral Thaden. The ostium of the Circumflex has 50% stenosis.  Right Coronary Artery: Large dominant vessel with 100% mid occlusion. The distal vessel fills right to right bridging collaterals and left to right collaterals.  Graft Anatomy:  LIMA to LAD is patent  Free Radial (? Y graft from LIMA) to OM1, OM2, PDA is occluded.  Left Ventricular Angiogram: Deferred.  Impression:  1. Triple vessel CAD with patent LIMA to LAD,  occluded RCA with good collateral filling, moderate disease in Circumflex  2. No focal targets for PCI  3. Ischemic cardiomyopathy with acute systolic CHF   27/0/62 Echo LVEF 15-20%, moderate LVH, severe diffuse hypokinesis, restrictive diastolic dysfunction, multiple WMAs, moderate MR, severe RV dysfunction RV TAPSE 0.7 cm, PASP 61 mmHg.   12/2013 Lab TC 175 TG 111 LDL 116 HDL 37  TSH 7.7 HgbA1c 6.6 Na 143 K 4.5 Cr 1.22 GFR56   04/2014 Echo Study Conclusions  - Left ventricle: The cavity size  was normal. Wall thickness was increased in a pattern of mild LVH. Systolic function was mildly to moderately reduced. The estimated ejection fraction was in the range of 40% to 45%. There is diastolic dysfunction, indeterminant grade. - Regional wall motion abnormality: Hypokinesis of the mid anterior and basal-mid anteroseptal myocardium. - Aortic valve: Moderately calcified annulus. Trileaflet; moderately thickened leaflets. Valve area (VTI): 2.48 cm^2. Valve area (Vmax): 2.23 cm^2. - Mitral valve: Moderately calcified annulus. Mildly thickened leaflets . - Left atrium: The atrium was moderately dilated. - Right atrium: The atrium was mildly dilated. - Technically adequate study.   01/2015 Carotid US bilaterall 1-39%    01/2018 echo Study Conclusions  - Left ventricle: The cavity size was normal. Wall thickness was increased in a pattern of moderate LVH. Systolic function was moderately reduced. The estimated ejection fraction was in the range of 35% to 40%. Diffuse hypokinesis. Features are consistent with a pseudonormal left ventricular filling pattern, with concomitant abnormal relaxation and increased filling pressure (grade 2 diastolic dysfunction). Doppler parameters are consistent with high ventricular filling pressure. - Regional wall motion abnormality: Moderate hypokinesis of the basal-mid anteroseptal, mid inferoseptal, and mid  inferior myocardium. - Aortic valve: Moderately calcified annulus. Mildly thickened, mildly calcified leaflets. There appears to be at least mild (if not mild to moderate) calcific aortic valvular stenosis. I suspect peak velocity and mean gradient is underestimated due to depressed LV function. Peak velocity (S): 159 cm/s. Mean gradient (S): 6 mm Hg. Valve area (VTI): 1.66 cm^2. Valve area (Vmax): 1.73 cm^2. Valve area (Vmean): 1.52 cm^2. - Mitral valve: Calcified annulus. Restricted leaflet motion due to depressed LV function. There was mild regurgitation. Valve area by continuity equation (using LVOT flow): 1.21 cm^2. - Left atrium: The atrium was mildly dilated. - Right ventricle: Systolic function was moderately reduced. - Tricuspid valve: There was mild regurgitation  03/2020 cath  Prox RCA lesion is 100% stenosed.  Prox LAD lesion is 100% stenosed.  LIMA graft was visualized by angiography and is normal in caliber.  Dist Cx lesion is 90% stenosed.  Ost Cx to Prox Cx lesion is 70% stenosed.  Lat Ramus lesion is 70% stenosed.  Ramus lesion is 70% stenosed.  1. Severe triple vessel CAD s/p 4V CABG with 1/4 patent grafts.  2. Occluded proximal LAD. Patent LIMA to LAD 3. Moderately severe proximal Circumflex stenosis, unchanged from last cath. Severe distal Circumflex disease. Too small for PCI.  4. Moderately severe intermediate disease, grossly unchanged since last cath  5. Chronically occluded proximal RCA. The distal vessel fills from left to right collaterals.  6. Normal filling pressures  Recommendations: He has moderately severe disease in the intermediate Angele Wiemann and the Circumflex. His LAD is chronically occluded but the LIMA graft to the mid LAD is patent. The RCA is chronically occluded and fills from left to right collaterals. There has been progression of his disease in the intermediate and Circumflex, however, in the absence of angina, I would  continue medical management of his CAD at this time.      Assessment and Plan  1.CAD/Chronic systolic HF -euvolemic on exam, home weights stable since discharge without recurrent SOB - continue current meds   2. Afib - no symptoms, continue current medical therapy.   F/u 4 months  Arnoldo Lenis, M.D..

## 2020-10-30 ENCOUNTER — Telehealth: Payer: Self-pay | Admitting: *Deleted

## 2020-10-30 NOTE — Telephone Encounter (Signed)
   [facility] Pre-operative Risk Assessment    HEARTCARE STAFF: - Please ensure there is not already an duplicate clearance open for this procedure. - Under Visit Info/Reason for Call, type in Other and utilize the format Clearance MM/DD/YY or Clearance TBD. Do not use dashes or single digits. - If request is for dental extraction, please clarify the # of teeth to be extracted.  Request for surgical clearance:  1. What type of surgery is being performed? Spinal Injection  2. When is this surgery scheduled? TBD  3. What type of clearance is required (medical clearance vs. Pharmacy clearance to hold med vs. Both)? Pharmacy  4. Are there any medications that need to be held prior to surgery and how long? Eliquis 3-5 days prior  5. Practice name and name of physician performing surgery? [facility]/ Dr. Marlaine Hind  6. What is the office phone number? [phone]   7.   What is the office fax number? [phone]  8.   Anesthesia type (None, local, MAC, general) ?    Marlou Sa 10/30/2020, 2:27 PM  _________________________________________________________________   (provider comments below)

## 2020-10-30 NOTE — Telephone Encounter (Signed)
Pharmacy, can you please comment on how long Eliquis can be held for upcoming spinal injection?  Thank you!

## 2020-10-30 NOTE — Telephone Encounter (Signed)
Patient with diagnosis of atrial fibrillation on Eliquis for anticoagulation.    Procedure: spinal injection Date of procedure: TBD    CHA2DS2-VASc Score = 8  This indicates a 10.8% annual risk of stroke. The patient's score is based upon: CHF History: 1 HTN History: 1 Diabetes History: 1 Stroke History: 2 Vascular Disease History: 1 Age Score: 2 Gender Score: 0   CrCl 53.1 Platelet count 154  Because of CHADS2-VASc score and history of stroke would recommend to bridge with enoxaparin.  Eliquis needs to be held x 3 days for any spinal procedures.  Will defer to Dr. Harl Bowie for final decision.

## 2020-10-31 NOTE — Telephone Encounter (Signed)
I would be ok without bridging, just holding the eliquis 3 days as suggested. I think the time frame is relatively short and would worry about bleeding risk with Mr Rossano or his family trying to administer the lovenox. Ive had a few elderly patients give themselves intraabdominal hematomas. Would just hold eliquis without bridging.    Zandra Abts MD

## 2020-10-31 NOTE — Telephone Encounter (Signed)
   Primary Cardiologist: Carlyle Dolly, MD  Chart reviewed as part of pre-operative protocol coverage. Given past medical history and time since last visit, based on ACC/AHA guidelines, THURSTON BRENDLINGER would be at acceptable risk for the planned procedure without further cardiovascular testing.   Per Dr Harl Bowie OK to hold Eliquis 3 days pre op if needed without bridging.  Resume as soon as safe post op.  The patient was advised that if he develops new symptoms prior to surgery to contact our office to arrange for a follow-up visit, and he verbalized understanding.  I will route this recommendation to the requesting party via Epic fax function and remove from pre-op pool.  Please call with questions.  Kerin Ransom, PA-C 10/31/2020, 4:47 PM

## 2020-11-02 NOTE — Progress Notes (Signed)
[facility] Note  11/06/2020     CHIEF COMPLAINT Patient presents for Retina Evaluation   HISTORY OF PRESENT ILLNESS: Gary Odonnell is a 86 y.o. male who presents to the clinic today for:   HPI    Retina Evaluation    In both eyes.  Context:  distance vision.  Treatments tried include artificial tears.  I, the attending physician,  performed the HPI with the patient and updated documentation appropriately.          Comments    Retina eval per Dr. Rosana Hoes, VMT-  Pt states he has had problems with his vision since cataract sx 05/2019 OS>OD.  OTC gtts TID OU.  BS doesn't check- pt states unsure how DM got into his chart because he isn't on medication and his doc says his BS is fine.         Last edited by Bernarda Caffey, MD on 11/06/2020  9:34 PM. (History)    pt is here on the referral of Dr. Rosana Hoes for concern of VMT OU, pt saw Dr. Zigmund Daniel in 2018, vision is stable since then, pt does not take vits for macular degeneration  Referring physician: Leticia Clas, [address] [facility] [address] [place],  [place] [address]  HISTORICAL INFORMATION:   Selected notes from the MEDICAL RECORD NUMBER Referred by Dr. Rosana Hoes for eval of VMT LEE:  Ocular Hx- PMH-    CURRENT MEDICATIONS: No current outpatient medications on file. (Ophthalmic Drugs)   No current facility-administered medications for this visit. (Ophthalmic Drugs)   Current Outpatient Medications (Other)  Medication Sig  . albuterol (PROVENTIL) (2.5 MG/3ML) 0.083% nebulizer solution Take 3 mLs (2.5 mg total) by nebulization every 6 (six) hours as needed for shortness of breath.  Marland Kitchen albuterol (VENTOLIN HFA) 108 (90 Base) MCG/ACT inhaler Inhale 2 puffs into the lungs every 6 (six) hours as needed for wheezing or shortness of breath.  Marland Kitchen apixaban (ELIQUIS) 5 MG TABS tablet Take 1 tablet (5 mg total) by mouth 2 (two) times daily.  Marland Kitchen aspirin 81 MG EC tablet Take 1 tablet (81 mg total) by mouth daily.  .  Budeson-Glycopyrrol-Formoterol (BREZTRI AEROSPHERE) 160-9-4.8 MCG/ACT AERO Inhale 2 puffs into the lungs in the morning and at bedtime.  . carvedilol (COREG) 6.25 MG tablet Take 1 tablet (6.25 mg total) by mouth 2 (two) times daily with a meal.  . dapagliflozin propanediol (FARXIGA) 10 MG TABS tablet Take 1 tablet (10 mg total) by mouth daily before breakfast.  . furosemide (LASIX) 40 MG tablet Take 1.5 tablets (60 mg total) by mouth daily.  Marland Kitchen guaiFENesin (MUCINEX) 600 MG 12 hr tablet Take 1 tablet (600 mg total) by mouth 2 (two) times daily.  . hydrALAZINE (APRESOLINE) 25 MG tablet Take 1.5 tablets (37.5 mg total) by mouth every 8 (eight) hours.  . isosorbide mononitrate (IMDUR) 30 MG 24 hr tablet Take 1 tablet (30 mg total) by mouth daily.  . nitroGLYCERIN (NITROSTAT) 0.3 MG SL tablet Place 1 tablet (0.3 mg total) under the tongue every 5 (five) minutes as needed for chest pain.  Marland Kitchen Potassium 99 MG TABS Take 1 tablet by mouth daily.   . sacubitril-valsartan (ENTRESTO) 49-51 MG Take 1 tablet by mouth 2 (two) times daily.  Marland Kitchen ezetimibe (ZETIA) 10 MG tablet Take 1 tablet (10 mg total) by mouth daily.   No current facility-administered medications for this visit. (Other)      REVIEW OF SYSTEMS: ROS  Positive for: Neurological, Endocrine, Cardiovascular, Eyes, Respiratory, Heme/Lymph   Negative for: Constitutional, Gastrointestinal, Skin, Genitourinary, Musculoskeletal, HENT, Psychiatric, Allergic/Imm   Last edited by Leonie Douglas, COA on 11/06/2020  2:25 PM. (History)       ALLERGIES Allergies  Allergen Reactions  . Statins Other (See Comments)    Intolerance per outpt Cardiology notes    PAST MEDICAL HISTORY Past Medical History:  Diagnosis Date  . Atrial fibrillation ([place])   . CHF (congestive heart failure) ([place])    a. EF 40-45% in 2015, at EF 30-35% by echo in 12/2018  . COPD (chronic obstructive pulmonary disease) ([place])   . Coronary artery disease    a. s/p CABG with most  recent cath in 2014 showing patent LIMA-LAD, occluded Radial to OM1-OM2-PDA and occluded RCA with L--> R collaterals  . Diabetic nephropathy ([place])   . Essential hypertension   . Hyperlipidemia   . PAD (peripheral artery disease) ([place])   . Pneumonia 09/2013  . Primary cancer of left lower lobe of lung ([place]) 02/02/2015  . Radiation 02/2015  . Spinal stenosis   . Stroke ([place]) 11/2008  . Type 2 diabetes mellitus ([place])    Past Surgical History:  Procedure Laterality Date  . Angioplasty to Left femoral artery    . CARPAL TUNNEL RELEASE Left 05/11/2014   Procedure: LEFT CARPAL TUNNEL RELEASE;  Surgeon: Carole Civil, MD;  Location: [facility];  Service: Orthopedics;  Laterality: Left;  . COLONOSCOPY N/A 01/24/2015   Procedure: COLONOSCOPY;  Surgeon: Danie Binder, MD;  Location: [facility] ENDO [facility]ITE;  Service: Endoscopy;  Laterality: N/A;  130  . CORONARY ARTERY BYPASS GRAFT     x4  . FEMORAL-POPLITEAL BYPASS GRAFT Right 10/23/2015   Procedure: BYPASS GRAFT FEMORAL TO BELOW KNEE POPLITEAL ARTERY USING RIGHT NON-REVERSED GREATER SAPPHENOUS VEIN;  Surgeon: [name], MD;  Location: [facility];  Service: Vascular;  Laterality: Right;  . INTRAOPERATIVE ARTERIOGRAM Right 10/23/2015   Procedure: INTRA OPERATIVE ARTERIOGRAM - RIGHT LOWER LEG;  Surgeon: [name], MD;  Location: [facility];  Service: Vascular;  Laterality: Right;  . KNEE ARTHROSCOPY Right   . LEFT AND RIGHT HEART CATHETERIZATION WITH CORONARY ANGIOGRAM N/A 09/29/2013   Procedure: LEFT AND RIGHT HEART CATHETERIZATION WITH CORONARY ANGIOGRAM;  Surgeon: Burnell Blanks, MD;  Location: [facility] CATH LAB;  Service: Cardiovascular;  Laterality: N/A;  . LEFT HEART CATH AND CORS/GRAFTS ANGIOGRAPHY N/A 04/11/2020   Procedure: LEFT HEART CATH AND CORS/GRAFTS ANGIOGRAPHY;  Surgeon: Burnell Blanks, MD;  Location: [facility] CV LAB;  Service: Cardiovascular;  Laterality: N/A;  . PERIPHERAL VASCULAR CATHETERIZATION N/A 04/21/2015   Procedure:  Abdominal Aortogram w/Lower Extremity;  Surgeon: [name], MD;  Location: [facility] CV LAB;  Service: Cardiovascular;  Laterality: N/A;  . Stent to right femoral artery    . TRIGGER FINGER RELEASE Bilateral   . VEIN HARVEST Right 10/23/2015   Procedure: [place];  Surgeon: [name], MD;  Location: [facility] OR;  Service: Vascular;  Laterality: Right;    FAMILY HISTORY Family History  Problem Relation Age of Onset  . Heart attack Father   . Hyperlipidemia Father   . Diabetes Mother   . Diabetes Brother   . Heart Problems Brother   . Colon cancer Neg Hx     SOCIAL HISTORY Social History   Tobacco Use  . Smoking status: Current Every Day Smoker    Packs/day: 0.50    Years: 60.00  Pack years: 30.00    Types: Cigars    Start date: 11/[birth year]/1949  . Smokeless tobacco: Never Used  . Tobacco comment: 1-2 cigars per day   Vaping Use  . Vaping Use: Never used  Substance Use Topics  . Alcohol use: No    Alcohol/week: 0.0 standard drinks  . Drug use: No         OPHTHALMIC EXAM:  Base Eye Exam    Visual Acuity (Snellen - Linear)      Right Left   Dist cc 20/40 -2 20/40   Dist ph cc NI NI       Tonometry (Tonopen, 2:40 PM)      Right Left   Pressure 11 8       Pupils      Dark Light Shape React APD   Right 2 1 Round Minimal None   Left 2 1 Round Minimal None       Visual Fields (Counting fingers)      Left Right    Full Full       Extraocular Movement      Right Left    Full Full       Neuro/Psych    Oriented x3: Yes   Mood/Affect: Normal       Dilation    Both eyes: 1.0% Mydriacyl, 2.5% Phenylephrine @ 2:40 PM        Slit Lamp and Fundus Exam    Slit Lamp Exam      Right Left   Lids/Lashes Dermatochalasis - upper lid Dermatochalasis - upper lid   Conjunctiva/Sclera White and quiet White and quiet   Cornea arcus, trace Punctate epithelial erosions arcus, trace Punctate epithelial erosions   Anterior  Chamber Deep and quiet Deep and quiet   Iris Round and moderately dilated round and poorly dilated to 4.21mm   Lens PC IOL in good position PC IOL in good position   Vitreous Vitreous syneresis Vitreous syneresis       Fundus Exam      Right Left   Disc Mild Pallor, Sharp rim, +cupping Sharp rim, central Pallor, +cupping   C/D Ratio 0.7 0.7   Macula Flat, Blunted foveal reflex, Drusen, RPE mottling and clumping, no heme Flat, Blunted foveal reflex, Drusen, RPE mottling and clumping, no heme   Vessels attenuated, mild tortuousity attenuated, mild tortuousity   Periphery Attached, no heme    Attached, no heme           Refraction    Wearing Rx      Sphere Cylinder Axis Add   Right +0.25 +0.25 095 +3.00   Left +0.50 Sphere  +3.00       Manifest Refraction      Sphere Cylinder Dist VA   Right -0.75 Sphere NI   Left +0.75 Sphere NI          IMAGING AND PROCEDURES  Imaging and Procedures for 11/06/2020  OCT, Retina - OU - Both Eyes       Right Eye Quality was good. Central Foveal Thickness: 299. Progression has worsened. Findings include abnormal foveal contour, no IRF, no SRF, vitreous traction, retinal drusen  (Mild progression of VMT compared to 2018 scan).   Left Eye Quality was good. Central Foveal Thickness: 304. Progression has worsened. Findings include abnormal foveal contour, intraretinal fluid, no SRF, retinal drusen , vitreous traction (mild interval progression of VMT with cystic changes compared).   Notes *Images captured and stored on drive  Diagnosis /  Impression:  VMT OU OD: Mild progression of VMT compared to 2018 scan OS: mild interval progression of VMT with cystic changes compared to 2018  Clinical management:  See below  Abbreviations: NFP - Normal foveal profile. CME - cystoid macular edema. PED - pigment epithelial detachment. IRF - intraretinal fluid. SRF - subretinal fluid. EZ - ellipsoid zone. ERM - epiretinal membrane. ORA - outer retinal  atrophy. ORT - outer retinal tubulation. SRHM - subretinal hyper-reflective material. IRHM - intraretinal hyper-reflective material                 ASSESSMENT/PLAN:    ICD-10-CM   1. Vitreomacular traction syndrome of both eyes  H43.823   2. Retinal edema  H35.81 OCT, Retina - OU - Both Eyes  3. Intermediate stage nonexudative age-related macular degeneration of both eyes  H35.3132   4. Diabetes mellitus type 2 without retinopathy ([place])  E11.9   5. Essential hypertension  I10   6. Hypertensive retinopathy of both eyes  H35.033   7. Pseudophakia, both eyes  Z96.1     1,2. VMT OU  - pt was seen by Dr. Zigmund Daniel in 2018  - OCT today shows mild interval progression of VMT OU compared to 2018  - BCVA stable at 20/40  - discussed findings, progression  - recommend monitoring  - f/u in 3-4 mos  3. Age related macular degeneration, non-exudative, both eyes  - The incidence, anatomy, and pathology of dry AMD, risk of progression, and the AREDS and AREDS 2 study including smoking risks discussed with patient.  - Recommend amsler grid monitoring  - f/u 3-4 months  4. Diabetes mellitus, type 2 without retinopathy  - last A1c: 6.7 on 11.12.21 - The incidence, risk factors for progression, natural history and treatment options for diabetic retinopathy  were discussed with patient.   - The need for close monitoring of blood glucose, blood pressure, and serum lipids, avoiding cigarette or any type of tobacco, and the need for long term follow up was also discussed with patient. - f/u in 1 year, sooner prn  5,6. Hypertensive retinopathy OU - discussed importance of tight BP control - monitor  7. Pseudophakia OU  - s/p CE/IOL OU  - IOL in good position, doing well - monitor    Ophthalmic Meds Ordered this visit:  No orders of the defined types were placed in this encounter.      Return for f/u 3-4 months, VMT OU, DFE, OCT.  There are no Patient Instructions on file for this  visit.   Explained the diagnoses, plan, and follow up with the patient and they expressed understanding.  Patient expressed understanding of the importance of proper follow up care.   This document serves as a record of services personally performed by Gardiner Sleeper, MD, PhD. It was created on their behalf by Leeann Must, [place], an ophthalmic technician. The creation of this record is the provider's dictation and/or activities during the visit.    Electronically signed by: Leeann Must, [place] 12.09.2021 9:41 PM   This document serves as a record of services personally performed by Gardiner Sleeper, MD, PhD. It was created on their behalf by San Jetty. Owens Shark, OA an ophthalmic technician. The creation of this record is the provider's dictation and/or activities during the visit.    Electronically signed by: San Jetty. Owens Shark, [place] 12.13.2021 9:41 PM   Gardiner Sleeper, M.D., Ph.D. Diseases & Surgery of the Retina and Vitreous [facility]  I have reviewed the above documentation for accuracy and completeness, and I agree with the above. Gardiner Sleeper, M.D., Ph.D. 11/06/20 9:41 PM   Abbreviations: M myopia (nearsighted); A astigmatism; H hyperopia (farsighted); P presbyopia; Mrx spectacle prescription;  CTL contact lenses; OD right eye; OS left eye; OU both eyes  XT exotropia; ET esotropia; PEK punctate epithelial keratitis; PEE punctate epithelial erosions; DES dry eye syndrome; MGD meibomian gland dysfunction; ATs artificial tears; PFAT's preservative free artificial tears; [place] nuclear sclerotic cataract; PSC posterior subcapsular cataract; ERM epi-retinal membrane; PVD posterior vitreous detachment; RD retinal detachment; DM diabetes mellitus; DR diabetic retinopathy; NPDR non-proliferative diabetic retinopathy; PDR proliferative diabetic retinopathy; CSME clinically significant macular edema; DME diabetic macular edema; dbh dot blot hemorrhages; CWS cotton wool spot; POAG  primary open angle glaucoma; C/D cup-to-disc ratio; HVF humphrey visual field; GVF goldmann visual field; OCT optical coherence tomography; IOP intraocular pressure; BRVO Branch retinal vein occlusion; CRVO central retinal vein occlusion; CRAO central retinal artery occlusion; BRAO branch retinal artery occlusion; RT retinal tear; SB scleral buckle; PPV pars plana vitrectomy; VH Vitreous hemorrhage; PRP panretinal laser photocoagulation; IVK intravitreal kenalog; VMT vitreomacular traction; MH Macular hole;  NVD neovascularization of the disc; NVE neovascularization elsewhere; AREDS age related eye disease study; ARMD age related macular degeneration; POAG primary open angle glaucoma; EBMD epithelial/anterior basement membrane dystrophy; ACIOL anterior chamber intraocular lens; IOL intraocular lens; PCIOL posterior chamber intraocular lens; Phaco/IOL phacoemulsification with intraocular lens placement; [place] photorefractive keratectomy; LASIK laser assisted in situ keratomileusis; HTN hypertension; DM diabetes mellitus; COPD chronic obstructive pulmonary disease

## 2020-11-06 ENCOUNTER — Ambulatory Visit (INDEPENDENT_AMBULATORY_CARE_PROVIDER_SITE_OTHER): Payer: Medicare Other | Admitting: Ophthalmology

## 2020-11-06 ENCOUNTER — Other Ambulatory Visit: Payer: Self-pay

## 2020-11-06 ENCOUNTER — Encounter (INDEPENDENT_AMBULATORY_CARE_PROVIDER_SITE_OTHER): Payer: Self-pay | Admitting: Ophthalmology

## 2020-11-06 DIAGNOSIS — H43823 Vitreomacular adhesion, bilateral: Secondary | ICD-10-CM

## 2020-11-06 DIAGNOSIS — H35033 Hypertensive retinopathy, bilateral: Secondary | ICD-10-CM

## 2020-11-06 DIAGNOSIS — E119 Type 2 diabetes mellitus without complications: Secondary | ICD-10-CM

## 2020-11-06 DIAGNOSIS — H353132 Nonexudative age-related macular degeneration, bilateral, intermediate dry stage: Secondary | ICD-10-CM | POA: Diagnosis not present

## 2020-11-06 DIAGNOSIS — I10 Essential (primary) hypertension: Secondary | ICD-10-CM

## 2020-11-06 DIAGNOSIS — Z961 Presence of intraocular lens: Secondary | ICD-10-CM | POA: Diagnosis not present

## 2020-11-06 DIAGNOSIS — H3581 Retinal edema: Secondary | ICD-10-CM | POA: Diagnosis not present

## 2020-11-10 DIAGNOSIS — Z6825 Body mass index (BMI) 25.0-25.9, adult: Secondary | ICD-10-CM | POA: Diagnosis not present

## 2020-11-10 DIAGNOSIS — M48061 Spinal stenosis, lumbar region without neurogenic claudication: Secondary | ICD-10-CM | POA: Diagnosis not present

## 2020-11-13 ENCOUNTER — Other Ambulatory Visit: Payer: Self-pay

## 2020-11-13 ENCOUNTER — Telehealth: Payer: Self-pay | Admitting: Cardiology

## 2020-11-13 MED ORDER — CARVEDILOL 6.25 MG PO TABS
6.2500 mg | ORAL_TABLET | Freq: Two times a day (BID) | ORAL | 3 refills | Status: DC
Start: 2020-11-13 — End: 2021-02-19

## 2020-11-13 MED ORDER — APIXABAN 5 MG PO TABS
5.0000 mg | ORAL_TABLET | Freq: Two times a day (BID) | ORAL | 3 refills | Status: DC
Start: 2020-11-13 — End: 2021-05-31

## 2020-11-13 NOTE — Telephone Encounter (Signed)
*  STAT* If patient is at the pharmacy, call can be transferred to refill team.   1. Which medications need to be refilled? (please list name of each medication and dose if known)   COREG 6.25     [place] 5. MG    60 PILLS    2. Which pharmacy/location (including street and city if local pharmacy) is medication to be sent to?  [facility]   3. Do they need a 30 day or 90 day supply?  (SEE ABOVE)

## 2020-11-30 ENCOUNTER — Other Ambulatory Visit: Payer: Self-pay | Admitting: Cardiology

## 2020-12-07 ENCOUNTER — Ambulatory Visit (INDEPENDENT_AMBULATORY_CARE_PROVIDER_SITE_OTHER): Payer: Medicare Other | Admitting: Family Medicine

## 2020-12-07 DIAGNOSIS — E119 Type 2 diabetes mellitus without complications: Secondary | ICD-10-CM

## 2020-12-07 DIAGNOSIS — N1831 Chronic kidney disease, stage 3a: Secondary | ICD-10-CM

## 2020-12-07 DIAGNOSIS — E1121 Type 2 diabetes mellitus with diabetic nephropathy: Secondary | ICD-10-CM

## 2020-12-07 DIAGNOSIS — J449 Chronic obstructive pulmonary disease, unspecified: Secondary | ICD-10-CM | POA: Diagnosis not present

## 2020-12-07 NOTE — Progress Notes (Signed)
Virtual Visit via Telephone Note  I connected with Gary Odonnell on 12/07/20 at 10:35 AM by telephone and verified that I am speaking with the correct person using two identifiers. Gary Odonnell is currently located at home and his wife is currently with him during this visit. The provider, Gary Brooklyn, FNP is located in their home at time of visit.  I discussed the limitations, risks, security and privacy concerns of performing an evaluation and management service by telephone and the availability of in person appointments. I also discussed with the patient that there may be a patient responsible charge related to this service. The patient expressed understanding and agreed to proceed.  Subjective: PCP: Gary Brooklyn, FNP  Chief Complaint  Patient presents with   Medical Management of Chronic Issues   CKD: We have attempted to get Gary Odonnell covered for CKD, but patient's insurance does not recognize this new indication for the medication.  COPD: Patient reports he is currently using his albuterol inhaler twice daily by habit.  He states every once in a while he forgets it for couple of days and does not have any symptoms to remind him to use it.  He was previously prescribed Breo which was not covered by his insurance and then [place], which he reports he never received.  Hyperlipidemia: Patient tolerate Zetia well.  He does have an intolerance to statins.  I previously tried to order Nexlizet which was denied by his insurance since I am not a specialist.  Diabetes: Patient presents for follow up of diabetes. Current symptoms include: none. Known diabetic complications: nephropathy, cardiovascular disease, cerebrovascular disease and peripheral vascular disease. Medication compliance: none. Is he  on ACE inhibitor or angiotensin II receptor blocker? Yes. Is he on a statin? No, intolerant.   Lab Results  Component Value Date   HGBA1C 6.7 (H) 10/06/2020   HGBA1C 6.5 09/06/2020    HGBA1C 7.0 (H) 04/08/2020   Lab Results  Component Value Date   LDLCALC 116 (H) 06/06/2020   CREATININE 1.30 (H) 10/08/2020     ROS: Per HPI  Current Outpatient Medications:    albuterol (PROVENTIL) (2.5 MG/3ML) 0.083% nebulizer solution, Take 3 mLs (2.5 mg total) by nebulization every 6 (six) hours as needed for shortness of breath., Disp: 75 mL, Rfl: 12   albuterol (VENTOLIN HFA) 108 (90 Base) MCG/ACT inhaler, Inhale 2 puffs into the lungs every 6 (six) hours as needed for wheezing or shortness of breath., Disp: 8 g, Rfl: 0   apixaban (ELIQUIS) 5 MG TABS tablet, Take 1 tablet (5 mg total) by mouth 2 (two) times daily., Disp: 180 tablet, Rfl: 3   aspirin 81 MG EC tablet, Take 1 tablet (81 mg total) by mouth daily., Disp: 30 tablet, Rfl: 0   Budeson-Glycopyrrol-Formoterol (BREZTRI AEROSPHERE) 160-9-4.8 MCG/ACT AERO, Inhale 2 puffs into the lungs in the morning and at bedtime., Disp: 10.7 g, Rfl: 3   carvedilol (COREG) 6.25 MG tablet, Take 1 tablet (6.25 mg total) by mouth 2 (two) times daily with a meal., Disp: 180 tablet, Rfl: 3   dapagliflozin propanediol (FARXIGA) 10 MG TABS tablet, Take 1 tablet (10 mg total) by mouth daily before breakfast., Disp: 90 tablet, Rfl: 1   ezetimibe (ZETIA) 10 MG tablet, Take 1 tablet (10 mg total) by mouth daily., Disp: 90 tablet, Rfl: 1   furosemide (LASIX) 40 MG tablet, TAKE 1 TABLET EVERY OTHER DAY ALTERNATING WITH ONE AND ONE-HALF TABLETS EVERY OTHER DAY, Disp: 120 tablet, Rfl:  3   guaiFENesin (MUCINEX) 600 MG 12 hr tablet, Take 1 tablet (600 mg total) by mouth 2 (two) times daily., Disp: 30 tablet, Rfl: 0   hydrALAZINE (APRESOLINE) 25 MG tablet, Take 1.5 tablets (37.5 mg total) by mouth every 8 (eight) hours., Disp: 405 tablet, Rfl: 1   isosorbide mononitrate (IMDUR) 30 MG 24 hr tablet, Take 1 tablet (30 mg total) by mouth daily., Disp: 30 tablet, Rfl: 0   nitroGLYCERIN (NITROSTAT) 0.3 MG SL tablet, Place 1 tablet (0.3 mg total) under the  tongue every 5 (five) minutes as needed for chest pain., Disp: 100 tablet, Rfl: 3   Potassium 99 MG TABS, Take 1 tablet by mouth daily. , Disp: , Rfl:    sacubitril-valsartan (ENTRESTO) 49-51 MG, Take 1 tablet by mouth 2 (two) times daily., Disp: 180 tablet, Rfl: 1  Allergies  Allergen Reactions   Statins Other (See Comments)    Intolerance per outpt Cardiology notes   Past Medical History:  Diagnosis Date   Atrial fibrillation ([place])    CHF (congestive heart failure) ([place])    a. EF 40-45% in 2015, at EF 30-35% by echo in 12/2018   COPD (chronic obstructive pulmonary disease) ([place])    Coronary artery disease    a. s/p CABG with most recent cath in 2014 showing patent LIMA-LAD, occluded Radial to OM1-OM2-PDA and occluded RCA with L--> R collaterals   Diabetic nephropathy ([place])    Essential hypertension    Hyperlipidemia    PAD (peripheral artery disease) ([place])    Pneumonia 09/2013   Primary cancer of left lower lobe of lung ([place]) 02/02/2015   Radiation 02/2015   Spinal stenosis    Stroke ([place]) 11/2008   Type 2 diabetes mellitus ([facility])     Observations/Objective: A&O  No respiratory distress or wheezing audible over the phone Mood, judgement, and thought processes all WNL   Assessment and Plan: 1. Stage 3a chronic kidney disease ([place]) - Will continue to monitor.  Unable to get [place] approved.  2. Chronic obstructive pulmonary disease, unspecified COPD type ([place]) - Discussed with patient that I want him to stop using his albuterol out of habit and use it when he is having symptoms that require it so that we have a better understanding of his COPD.  We can determine at his follow-up if he needs a maintenance inhaler based on how often he is using his albuterol for symptoms.  3. Diet-controlled diabetes mellitus ([place]) Lab Results  Component Value Date   HGBA1C 6.7 (H) 10/06/2020   HGBA1C 6.5 09/06/2020   HGBA1C 7.0 (H) 04/08/2020    - Diabetes is at goal of  A1c < 7. - Medications: not currently on medications as he is diet controlled - Patient is not currently taking a statin, as he is intolerant to them. Patient is taking an ACE-inhibitor/ARB.  - Last foot exam: 09/06/2020 - Last diabetic eye exam: 11/06/2020 - Urine Microalbumin/Creat Ratio: 09/06/2020  4. Diabetic nephropathy associated with type 2 diabetes mellitus ([place]) - Diabetes is well controlled and patient is on ACE/ARB.   Follow Up Instructions: Return in about 6 weeks (around 01/18/2021) for COPD w. labs.  I discussed the assessment and treatment plan with the patient. The patient was provided an opportunity to ask questions and all were answered. The patient agreed with the plan and demonstrated an understanding of the instructions.   The patient was advised to call back or seek an in-person evaluation if the symptoms worsen or if the  condition fails to improve as anticipated.  The above assessment and management plan was discussed with the patient. The patient verbalized understanding of and has agreed to the management plan. Patient is aware to call the clinic if symptoms persist or worsen. Patient is aware when to return to the clinic for a follow-up visit. Patient educated on when it is appropriate to go to the emergency department.   Time call ended: 10:55 AM  I provided 22 minutes of non-face-to-face time during this encounter.  Hendricks Limes, MSN, APRN, FNP-C [facility] 12/07/20

## 2020-12-08 ENCOUNTER — Encounter: Payer: Self-pay | Admitting: Family Medicine

## 2020-12-11 ENCOUNTER — Other Ambulatory Visit: Payer: Self-pay | Admitting: Cardiology

## 2021-01-19 ENCOUNTER — Ambulatory Visit: Payer: Medicare Other | Admitting: Family Medicine

## 2021-02-19 ENCOUNTER — Telehealth: Payer: Self-pay | Admitting: Cardiology

## 2021-02-19 MED ORDER — CARVEDILOL 6.25 MG PO TABS: 6.2500 mg | ORAL_TABLET | Freq: Two times a day (BID) | ORAL | 3 refills | Status: DC

## 2021-02-19 NOTE — Telephone Encounter (Signed)
Done

## 2021-02-19 NOTE — Telephone Encounter (Signed)
    1. Which medications need to be refilled? (please list name of each medication and dose if known) COREG   2. Which pharmacy/location (including street and city if local pharmacy) is medication to be sent to? [facility]   3. Do they need a 30 day or 90 day supply? [place]

## 2021-02-22 ENCOUNTER — Ambulatory Visit (INDEPENDENT_AMBULATORY_CARE_PROVIDER_SITE_OTHER): Payer: Medicare Other | Admitting: Cardiology

## 2021-02-22 ENCOUNTER — Encounter: Payer: Self-pay | Admitting: Cardiology

## 2021-02-22 ENCOUNTER — Other Ambulatory Visit: Payer: Self-pay

## 2021-02-22 ENCOUNTER — Encounter: Payer: Self-pay | Admitting: *Deleted

## 2021-02-22 VITALS — BP 120/60 | HR 76 | Ht 71.5 in | Wt 183.0 lb

## 2021-02-22 DIAGNOSIS — I2510 Atherosclerotic heart disease of native coronary artery without angina pectoris: Secondary | ICD-10-CM

## 2021-02-22 DIAGNOSIS — E782 Mixed hyperlipidemia: Secondary | ICD-10-CM | POA: Diagnosis not present

## 2021-02-22 DIAGNOSIS — I4891 Unspecified atrial fibrillation: Secondary | ICD-10-CM | POA: Diagnosis not present

## 2021-02-22 DIAGNOSIS — I10 Essential (primary) hypertension: Secondary | ICD-10-CM

## 2021-02-22 DIAGNOSIS — I5022 Chronic systolic (congestive) heart failure: Secondary | ICD-10-CM

## 2021-02-22 NOTE — Patient Instructions (Signed)
Your physician recommends that you schedule a follow-up appointment in: 6 MONTHS WITH DR BRANCH  Your physician recommends that you continue on your current medications as directed. Please refer to the Current Medication list given to you today.  Thank you for choosing [facility]!!    

## 2021-02-22 NOTE — Progress Notes (Signed)
Clinical Summary [name] is a 87 y.o.male seen today for follow up of the following medical problems.   1. CAD/ICM/Chronic systolic heart failure - history of prior CABG 2003 in [place]  - 09/2013 admission to [facility] with decompensated heart failure. Workup included an echo which showed an LVEF of 15%. He was transferred to [facility] for further management.  - cath 09/2013 showed LM 30%, LAD 100% proximal, LCX 50% ostial, RCA 100% mid with distal vessel filling with right to right and left to right collaterals. LIMA-LAD patent, graft from [place] to OM1, OM2, PDA occluded. No PCI targets, recommendations for medical management. RHC mean PA 43, wedge 22, CI 1.74.  - repeat echo 04/2014 shows LVEF improved to 40-45%. - 01/2018 echo LVEF 35-40%, grade II diastolic dysfunction.       -admit 03/2020 with NSTEMI,actually presented with fall out of bed and N/V, no chest pain 03/2020 cath: severe 3 vessel disease, 1/4 patent grafts as reported below. Overall stable disease, recs for medical therapy - 03/2020 echo LVEF 35-40%, grade III DDx, PASP 56  -09/2020 echo LVEF 40-45%  - no recent edema. No SOB or DOE - compliant with meds, given extensive CAD has been on eliquis and ASA   2. Afib - new diagnosis during 12/27/2018 ER visit. Eliquis started in clniic 12/29/17 - CHADS2Vasc score is (age x2, CHF, HTN, stroke x2, CAD) is 7   - no recent palpitations - no bleeding on eliquis    3. Hyperlipidemia  - lipitor and crestor have caused prior muscle aches, not on statin - 06/2017 TC 196 TG 105 HDL 36 LDL 139 . Has been resistant to pcsk9 inhibitors.  - labs followed by pcp, he is on zetia  4. Lung CA - has completed radiation treatment.  - followed by Dr Dwana Curd  5. PAD - followed by vascular, previous stenting to left external iliac and right common iliac.  - s/p right femoral to below-knee popliteal bypass with vein 10/23/2015.   6.  HTN -compliant with  meds   7. Hypothyroidism - patient has been reluctant for treatment. - followed by pcp Past Medical History:  Diagnosis Date  . Atrial fibrillation ([place])   . CHF (congestive heart failure) ([place])    a. EF 40-45% in 2015, at EF 30-35% by echo in 12/2018  . COPD (chronic obstructive pulmonary disease) ([place])   . Coronary artery disease    a. s/p CABG with most recent cath in 2014 showing patent LIMA-LAD, occluded Radial to OM1-OM2-PDA and occluded RCA with L--> R collaterals  . Diabetic nephropathy ([place])   . Essential hypertension   . Hyperlipidemia   . PAD (peripheral artery disease) ([place])   . Pneumonia 09/2013  . Primary cancer of left lower lobe of lung ([place]) 02/02/2015  . Radiation 02/2015  . Spinal stenosis   . Stroke ([place]) 11/2008  . Type 2 diabetes mellitus ([facility])      Allergies  Allergen Reactions  . Statins Other (See Comments)    Intolerance per outpt Cardiology notes     Current Outpatient Medications  Medication Sig Dispense Refill  . albuterol (PROVENTIL) (2.5 MG/3ML) 0.083% nebulizer solution Take 3 mLs (2.5 mg total) by nebulization every 6 (six) hours as needed for shortness of breath. 75 mL 12  . albuterol (VENTOLIN HFA) 108 (90 Base) MCG/ACT inhaler Inhale 2 puffs into the lungs every 6 (six) hours as needed for wheezing or shortness of breath. 8 g 0  .  apixaban (ELIQUIS) 5 MG TABS tablet Take 1 tablet (5 mg total) by mouth 2 (two) times daily. 180 tablet 3  . aspirin 81 MG EC tablet Take 1 tablet (81 mg total) by mouth daily. 30 tablet 0  . carvedilol (COREG) 6.25 MG tablet Take 1 tablet (6.25 mg total) by mouth 2 (two) times daily with a meal. 180 tablet 3  . ezetimibe (ZETIA) 10 MG tablet TAKE 1 TABLET DAILY 90 tablet 3  . furosemide (LASIX) 40 MG tablet TAKE 1 TABLET EVERY OTHER DAY ALTERNATING WITH ONE AND ONE-HALF TABLETS EVERY OTHER DAY 120 tablet 3  . hydrALAZINE (APRESOLINE) 25 MG tablet Take 1.5 tablets (37.5 mg total) by mouth  every 8 (eight) hours. 405 tablet 1  . nitroGLYCERIN (NITROSTAT) 0.3 MG SL tablet Place 1 tablet (0.3 mg total) under the tongue every 5 (five) minutes as needed for chest pain. 100 tablet 3  . Potassium 99 MG TABS Take 1 tablet by mouth daily.     . sacubitril-valsartan (ENTRESTO) 49-51 MG Take 1 tablet by mouth 2 (two) times daily. 180 tablet 1   No current facility-administered medications for this visit.     Past Surgical History:  Procedure Laterality Date  . Angioplasty to Left femoral artery    . CARPAL TUNNEL RELEASE Left 05/11/2014   Procedure: LEFT CARPAL TUNNEL RELEASE;  Surgeon: Carole Civil, MD;  Location: [facility];  Service: Orthopedics;  Laterality: Left;  . COLONOSCOPY N/A 01/24/2015   Procedure: COLONOSCOPY;  Surgeon: Danie Binder, MD;  Location: [facility] SU[facility];  Service: Endoscopy;  Laterality: N/A;  130  . CORONARY ARTERY BYPASS GRAFT     x4  . FEMORAL-POPLITEAL BYPASS GRAFT Right 10/23/2015   Procedure: BYPASS GRAFT FEMORAL TO BELOW KNEE POPLITEAL ARTERY USING RIGHT NON-REVERSED GREATER SAPPHENOUS VEIN;  Surgeon: [name], MD;  Location: [facility];  Service: Vascular;  Laterality: Right;  . INTRAOPERATIVE ARTERIOGRAM Right 10/23/2015   Procedure: INTRA OPERATIVE ARTERIOGRAM - RIGHT LOWER LEG;  Surgeon: [name], MD;  Location: [facility];  Service: Vascular;  Laterality: Right;  . KNEE ARTHROSCOPY Right   . LEFT AND RIGHT HEART CATHETERIZATION WITH CORONARY ANGIOGRAM N/A 09/29/2013   Procedure: LEFT AND RIGHT HEART CATHETERIZATION WITH CORONARY ANGIOGRAM;  Surgeon: Burnell Blanks, MD;  Location: [facility] CATH LAB;  Service: Cardiovascular;  Laterality: N/A;  . LEFT HEART CATH AND CORS/GRAFTS ANGIOGRAPHY N/A 04/11/2020   Procedure: LEFT HEART CATH AND CORS/GRAFTS ANGIOGRAPHY;  Surgeon: Burnell Blanks, MD;  Location: [facility] CV LAB;  Service: Cardiovascular;  Laterality: N/A;  . PERIPHERAL VASCULAR CATHETERIZATION N/A 04/21/2015   Procedure:  Abdominal Aortogram w/Lower Extremity;  Surgeon: [name], MD;  Location: [facility] CV LAB;  Service: Cardiovascular;  Laterality: N/A;  . Stent to right femoral artery    . TRIGGER FINGER RELEASE Bilateral   . VEIN HARVEST Right 10/23/2015   Procedure: [place];  Surgeon: [name], MD;  Location: [facility];  Service: Vascular;  Laterality: Right;     Allergies  Allergen Reactions  . Statins Other (See Comments)    Intolerance per outpt Cardiology notes      Family History  Problem Relation Age of Onset  . Heart attack Father   . Hyperlipidemia Father   . Diabetes Mother   . Diabetes Brother   . Heart Problems Brother   . Colon cancer Neg Hx      Social History Mr. Bargar reports  that he has been smoking cigars. He started smoking about 71 years ago. He has a 30.00 pack-year smoking history. He has never used smokeless tobacco. Mr. Rybicki reports no history of alcohol use.   Review of Systems CONSTITUTIONAL: No weight loss, fever, chills, weakness or fatigue.  HEENT: Eyes: No visual loss, blurred vision, double vision or yellow sclerae.No hearing loss, sneezing, congestion, runny nose or sore throat.  SKIN: No rash or itching.  CARDIOVASCULAR: per hpi RESPIRATORY: No shortness of breath, cough or sputum.  GASTROINTESTINAL: No anorexia, nausea, vomiting or diarrhea. No abdominal pain or blood.  GENITOURINARY: No burning on urination, no polyuria NEUROLOGICAL: No headache, dizziness, syncope, paralysis, ataxia, numbness or tingling in the extremities. No change in bowel or bladder control.  MUSCULOSKELETAL: No muscle, back pain, joint pain or stiffness.  LYMPHATICS: No enlarged nodes. No history of splenectomy.  PSYCHIATRIC: No history of depression or anxiety.  ENDOCRINOLOGIC: No reports of sweating, cold or heat intolerance. No polyuria or polydipsia.  Marland Kitchen   Physical Examination Today's Vitals   02/22/21 1357  BP: 120/60   Pulse: 76  SpO2: 92%  Weight: 183 lb (83 kg)  Height: 5' 11.5" (1.816 m)   Body mass index is 25.17 kg/m.  Gen: resting comfortably, no acute distress HEENT: no scleral icterus, pupils equal round and reactive, no palptable cervical adenopathy,  CV: irreg, no mr/g, no jvd Resp: Clear to auscultation bilaterally GI: abdomen is soft, non-tender, non-distended, normal bowel sounds, no hepatosplenomegaly MSK: extremities are warm, no edema.  Skin: warm, no rash Neuro:  no focal deficits Psych: appropriate affect   Diagnostic Studies 09/29/13 Cath Hemodynamic Findings: Ao: 162/70  LV: 163/19/29  RA: 12 RV: 66/7/16  PA: 67/26 (mean 43)  PCWP: 22  Fick Cardiac Output: 3.5 L/min  Fick Cardiac Index: 1.74 L/min/m2  Central Aortic Saturation: 91%  Pulmonary Artery Saturation: 50%  Angiographic Findings:  Left main: Distal 30% stenosis.  Left Anterior Descending Artery: 100% proximal occlusion. Proximal, mid and distal vessel fills from the patent IMA graft.  Circumflex Artery: Large caliber vessel with large intermediate Linnet Bottari. Diffuse non-obstructive plaque in the large bifurcating intermediate Eilleen Davoli. The ostium of the Circumflex has 50% stenosis.  Right Coronary Artery: Large dominant vessel with 100% mid occlusion. The distal vessel fills right to right bridging collaterals and left to right collaterals.  Graft Anatomy:  LIMA to LAD is patent  Free Radial (? Y graft from LIMA) to OM1, OM2, PDA is occluded.  Left Ventricular Angiogram: Deferred.  Impression:  1. Triple vessel CAD with patent LIMA to LAD, occluded RCA with good collateral filling, moderate disease in Circumflex  2. No focal targets for PCI  3. Ischemic cardiomyopathy with acute systolic CHF   65/6/81 Echo LVEF 15-20%, moderate LVH, severe diffuse hypokinesis, restrictive diastolic dysfunction, multiple WMAs, moderate MR, severe RV dysfunction RV TAPSE 0.7 cm, PASP 61 mmHg.    12/2013 Lab TC 175 TG 111 LDL 116 HDL 37  TSH 7.7 HgbA1c 6.6 Na 143 K 4.5 Cr 1.22 GFR56   04/2014 Echo Study Conclusions  - Left ventricle: The cavity size was normal. Wall thickness was increased in a pattern of mild LVH. Systolic function was mildly to moderately reduced. The estimated ejection fraction was in the range of 40% to 45%. There is diastolic dysfunction, indeterminant grade. - Regional wall motion abnormality: Hypokinesis of the mid anterior and basal-mid anteroseptal myocardium. - Aortic valve: Moderately calcified annulus. Trileaflet; moderately thickened leaflets. Valve area (VTI): 2.48 cm^2. Valve  area (Vmax): 2.23 cm^2. - Mitral valve: Moderately calcified annulus. Mildly thickened leaflets . - Left atrium: The atrium was moderately dilated. - Right atrium: The atrium was mildly dilated. - Technically adequate study.   01/2015 Carotid US bilaterall 1-39%    01/2018 echo Study Conclusions  - Left ventricle: The cavity size was normal. Wall thickness was increased in a pattern of moderate LVH. Systolic function was moderately reduced. The estimated ejection fraction was in the range of 35% to 40%. Diffuse hypokinesis. Features are consistent with a pseudonormal left ventricular filling pattern, with concomitant abnormal relaxation and increased filling pressure (grade 2 diastolic dysfunction). Doppler parameters are consistent with high ventricular filling pressure. - Regional wall motion abnormality: Moderate hypokinesis of the basal-mid anteroseptal, mid inferoseptal, and mid inferior myocardium. - Aortic valve: Moderately calcified annulus. Mildly thickened, mildly calcified leaflets. There appears to be at least mild (if not mild to moderate) calcific aortic valvular stenosis. I suspect peak velocity and mean gradient is underestimated due to depressed LV function. Peak velocity (S): 159 cm/s. Mean  gradient (S): 6 mm Hg. Valve area (VTI): 1.66 cm^2. Valve area (Vmax): 1.73 cm^2. Valve area (Vmean): 1.52 cm^2. - Mitral valve: Calcified annulus. Restricted leaflet motion due to depressed LV function. There was mild regurgitation. Valve area by continuity equation (using LVOT flow): 1.21 cm^2. - Left atrium: The atrium was mildly dilated. - Right ventricle: Systolic function was moderately reduced. - Tricuspid valve: There was mild regurgitation  03/2020 cath  Prox RCA lesion is 100% stenosed.  Prox LAD lesion is 100% stenosed.  LIMA graft was visualized by angiography and is normal in caliber.  Dist Cx lesion is 90% stenosed.  Ost Cx to Prox Cx lesion is 70% stenosed.  Lat Ramus lesion is 70% stenosed.  Ramus lesion is 70% stenosed.  1. Severe triple vessel CAD s/p 4V CABG with 1/4 patent grafts.  2. Occluded proximal LAD. Patent LIMA to LAD 3. Moderately severe proximal Circumflex stenosis, unchanged from last cath. Severe distal Circumflex disease. Too small for PCI.  4. Moderately severe intermediate disease, grossly unchanged since last cath  5. Chronically occluded proximal RCA. The distal vessel fills from left to right collaterals.  6. Normal filling pressures  Recommendations: He has moderately severe disease in the intermediate Viraj Liby and the Circumflex. His LAD is chronically occluded but the LIMA graft to the mid LAD is patent. The RCA is chronically occluded and fills from left to right collaterals. There has been progression of his disease in the intermediate and Circumflex, however, in the absence of angina, I would continue medical management of his CAD at this time.    Assessment and Plan  1.CAD/Chronic systolic HF -no significant symptoms, continue current meds - have continued ASA along with his eliquis due to extensive history of CAD, cath last year with NSTEMI severe 3 vessel disease 1/4 patent grafts  2. Afib - no symptoms, continue  current meds   3. HTN - bp at goal, continue current meds  4. Hyperlipidemia - has refused pcsk9-Is - continue zetia - request pcp labs     Arnoldo Lenis, M.D.

## 2021-03-01 NOTE — Progress Notes (Signed)
[facility] Note  03/05/2021     CHIEF COMPLAINT Patient presents for Retina Follow Up   HISTORY OF PRESENT ILLNESS: Gary Odonnell is a 87 y.o. male who presents to the clinic today for:   HPI    Retina Follow Up    Patient presents with  Other.  In both eyes.  This started 4 months ago.  I, the attending physician,  performed the HPI with the patient and updated documentation appropriately.          Comments    Pt here for 4 month follow up VMT OU. Pt states vision is doing ok, just has some burning OS when he watches TV or reads/does his word puzzles. Does report taking lubrication drops.        Last edited by Bernarda Caffey, MD on 03/05/2021 12:47 PM. (History)    Pt reports OS burns a lot.  Uses a Rx gtt and 2 otc gtts prn for dry eye.  Referring physician: Loman [place], [place],  [place] [address]  HISTORICAL INFORMATION:   Selected notes from the MEDICAL RECORD NUMBER Referred by Dr. Rosana Hoes for eval of VMT   CURRENT MEDICATIONS: No current outpatient medications on file. (Ophthalmic Drugs)   No current facility-administered medications for this visit. (Ophthalmic Drugs)   Current Outpatient Medications (Other)  Medication Sig  . albuterol (PROVENTIL) (2.5 MG/3ML) 0.083% nebulizer solution Take 3 mLs (2.5 mg total) by nebulization every 6 (six) hours as needed for shortness of breath.  Marland Kitchen albuterol (VENTOLIN HFA) 108 (90 Base) MCG/ACT inhaler Inhale 2 puffs into the lungs every 6 (six) hours as needed for wheezing or shortness of breath.  Marland Kitchen apixaban (ELIQUIS) 5 MG TABS tablet Take 1 tablet (5 mg total) by mouth 2 (two) times daily.  Marland Kitchen aspirin 81 MG EC tablet Take 1 tablet (81 mg total) by mouth daily.  . carvedilol (COREG) 6.25 MG tablet Take 1 tablet (6.25 mg total) by mouth 2 (two) times daily with a meal.  . ezetimibe (ZETIA) 10 MG tablet TAKE 1 TABLET DAILY  . furosemide (LASIX) 40 MG tablet TAKE 1 TABLET EVERY  OTHER DAY ALTERNATING WITH ONE AND ONE-HALF TABLETS EVERY OTHER DAY  . hydrALAZINE (APRESOLINE) 25 MG tablet Take 1.5 tablets (37.5 mg total) by mouth every 8 (eight) hours.  . nitroGLYCERIN (NITROSTAT) 0.3 MG SL tablet Place 1 tablet (0.3 mg total) under the tongue every 5 (five) minutes as needed for chest pain.  Marland Kitchen Potassium 99 MG TABS Take 1 tablet by mouth daily.   . sacubitril-valsartan (ENTRESTO) 49-51 MG Take 1 tablet by mouth 2 (two) times daily.   No current facility-administered medications for this visit. (Other)      REVIEW OF SYSTEMS: ROS    Positive for: Neurological, Endocrine, Cardiovascular, Eyes, Respiratory, Heme/Lymph   Negative for: Constitutional, Gastrointestinal, Skin, Genitourinary, Musculoskeletal, HENT, Psychiatric, Allergic/Imm   Last edited by Kingsley Spittle, COT on 03/05/2021 10:10 AM. (History)       ALLERGIES Allergies  Allergen Reactions  . Statins Other (See Comments)    Intolerance per outpt Cardiology notes    PAST MEDICAL HISTORY Past Medical History:  Diagnosis Date  . Atrial fibrillation ([place])   . CHF (congestive heart failure) ([place])    a. EF 40-45% in 2015, at EF 30-35% by echo in 12/2018  . COPD (chronic obstructive pulmonary disease) ([place])   . Coronary artery disease    a.  s/p CABG with most recent cath in 2014 showing patent LIMA-LAD, occluded Radial to OM1-OM2-PDA and occluded RCA with L--> R collaterals  . Diabetic nephropathy ([place])   . Essential hypertension   . Hyperlipidemia   . PAD (peripheral artery disease) ([place])   . Pneumonia 09/2013  . Primary cancer of left lower lobe of lung ([place]) 02/02/2015  . Radiation 02/2015  . Spinal stenosis   . Stroke ([place]) 11/2008  . Type 2 diabetes mellitus ([place])    Past Surgical History:  Procedure Laterality Date  . Angioplasty to Left femoral artery    . CARPAL TUNNEL RELEASE Left 05/11/2014   Procedure: LEFT CARPAL TUNNEL RELEASE;  Surgeon: Carole Civil, MD;  Location: [facility]  ORS;  Service: Orthopedics;  Laterality: Left;  . COLONOSCOPY N/A 01/24/2015   Procedure: COLONOSCOPY;  Surgeon: Danie Binder, MD;  Location: [facility] ENDO SUITE;  Service: Endoscopy;  Laterality: N/A;  130  . CORONARY ARTERY BYPASS GRAFT     x4  . FEMORAL-POPLITEAL BYPASS GRAFT Right 10/23/2015   Procedure: BYPASS GRAFT FEMORAL TO BELOW KNEE POPLITEAL ARTERY USING RIGHT NON-REVERSED GREATER SAPPHENOUS VEIN;  Surgeon: [name], MD;  Location: [place];  Service: Vascular;  Laterality: Right;  . INTRAOPERATIVE ARTERIOGRAM Right 10/23/2015   Procedure: INTRA OPERATIVE ARTERIOGRAM - RIGHT LOWER LEG;  Surgeon: [name], MD;  Location: [place];  Service: Vascular;  Laterality: Right;  . KNEE ARTHROSCOPY Right   . LEFT AND RIGHT HEART CATHETERIZATION WITH CORONARY ANGIOGRAM N/A 09/29/2013   Procedure: LEFT AND RIGHT HEART CATHETERIZATION WITH CORONARY ANGIOGRAM;  Surgeon: Burnell Blanks, MD;  Location[facility] C[facility] LAB;  Service: Cardiovascular;  Laterality: N/A;  . LEFT HEART CATH AND CORS/GRAFTS ANGIOGRAPHY N/A 04/11/2020   Procedure: LEFT HEART CATH AND CORS/GRAFTS ANGIOGRAPHY;  Surgeon: Burnell Blanks, MD;  Location: [place] CV LAB;  Service: Cardiovascular;  Laterality: N/A;  . PERIPHERAL VASCULAR CATHETERIZATION N/A 04/21/2015   Procedure: Abdominal Aortogram w/Lower Extremity;  Surgeon: [name], MD;  Location: [place] CV LAB;  Service: Cardiovascular;  Laterality: N/A;  . Stent to right femoral artery    . TRIGGER FINGER RELEASE Bilateral   . VEIN HARVEST Right 10/23/2015   Procedure: [place];  Surgeon: [name], MD;  Location: [facility] OR;  Service: Vascular;  Laterality: Right;    FAMILY HISTORY Family History  Problem Relation Age of Onset  . Heart attack Father   . Hyperlipidemia Father   . Diabetes Mother   . Diabetes Brother   . Heart Problems Brother   . Colon cancer Neg Hx     SOCIAL HISTORY Social History    Tobacco Use  . Smoking status: Current Every Day Smoker    Packs/day: 0.50    Years: 60.00    Pack years: 30.00    Types: Cigars    Start date: 11/21/1949  . Smokeless tobacco: Never Used  . Tobacco comment: 1-2 cigars per day   Vaping Use  . Vaping Use: Never used  Substance Use Topics  . Alcohol use: No    Alcohol/week: 0.0 standard drinks  . Drug use: No         OPHTHALMIC EXAM:  Base Eye Exam    Visual Acuity (Snellen - Linear)      Right Left   Dist cc 20/60 20/50   Dist ph cc NI NI       Tonometry (Tonopen, 10:18 AM)  Right Left   Pressure 8 9       Pupils      Dark Light Shape React APD   Right 2 1 Round Minimal None   Left 2 1 Round Minimal None       Visual Fields (Counting fingers)      Left Right    Full Full       Extraocular Movement      Right Left    Full, Ortho Full, Ortho       Neuro/Psych    Oriented x3: Yes   Mood/Affect: Normal        Slit Lamp and Fundus Exam    Slit Lamp Exam      Right Left   Lids/Lashes Dermatochalasis - upper lid Dermatochalasis - upper lid, mild LL entropion   Conjunctiva/Sclera White and quiet White and quiet   Cornea arcus, 1+ PEE Punctate epithelial erosions, mild tear film debris arcus, 1+ Punctate epithelial erosions, mild tear film debris   Anterior Chamber Deep and quiet Deep and quiet   Iris Round and moderately dilated round and moderately dilated   Lens PC IOL in good position, 1-2+ PCO PC IOL in good position; mild PCO   Vitreous Vitreous syneresis Vitreous syneresis       Fundus Exam      Right Left   Disc Mild Pallor, Sharp rim, +cupping Sharp rim, central Pallor, +cupping   C/D Ratio 0.7 0.7   Macula Flat, Blunted foveal reflex, Drusen, RPE mottling and clumping, no heme Flat, Blunted foveal reflex, Drusen, RPE mottling and clumping, no heme   Vessels attenuated, mild tortuousity attenuated, mild tortuousity   Periphery Attached, no heme    Attached, no heme            Refraction    Wearing Rx      Sphere Cylinder Axis Add   Right +0.25 +0.25 095 +3.00   Left +0.50 Sphere  +3.00          IMAGING AND PROCEDURES  Imaging and Procedures for 03/05/2021  OCT, Retina - OU - Both Eyes       Right Eye Quality was good. Central Foveal Thickness: 301. Progression has been stable. Findings include abnormal foveal contour, no IRF, no SRF, vitreous traction, retinal drusen , subretinal hyper-reflective material (VMT stable, interval progression of central SRHM ?vitelliform like lesion).   Left Eye Quality was good. Central Foveal Thickness: 311. Progression has been stable. Findings include abnormal foveal contour, intraretinal fluid, no SRF, retinal drusen , vitreous traction, subretinal hyper-reflective material (Stable VMT ?interval progression of central SRHM/drusen).   Notes *Images captured and stored on drive  Diagnosis / Impression:  VMT OU OD: VMT stable; mild interval progression of central SRHM ?vitelliform like lesion OS: Stable VMT ?interval progression of central SRHM/drusen  Clinical management:  See below  Abbreviations: NFP - Normal foveal profile. CME - cystoid macular edema. PED - pigment epithelial detachment. IRF - intraretinal fluid. SRF - subretinal fluid. EZ - ellipsoid zone. ERM - epiretinal membrane. ORA - outer retinal atrophy. ORT - outer retinal tubulation. SRHM - subretinal hyper-reflective material. IRHM - intraretinal hyper-reflective material                 ASSESSMENT/PLAN:    ICD-10-CM   1. Vitreomacular traction syndrome of both eyes  H43.823   2. Retinal edema  H35.81 OCT, Retina - OU - Both Eyes  3. Intermediate stage nonexudative age-related macular degeneration of both eyes  H35.3132   4. Diabetes mellitus type 2 without retinopathy ([place])  E11.9   5. Essential hypertension  I10   6. Hypertensive retinopathy of both eyes  H35.033   7. Pseudophakia, both eyes  Z96.1     1,2. VMT OU  - pt was seen  by Dr. Zigmund Daniel in 2018  - OCT today shows OD: VMT stable, interval progression of central SRHM ?vitelliform like lesion                                              OS: Stable VMT ?interval progression of central SRHM/drusen  - BCVA OD down to 20/60 from 20/40                          OS down to 20/50 from 20/40  - discussed findings, progression  - recommend monitoring  - f/u in 4 mos  3. Age related macular degeneration, non-exudative, both eyes  - ?progression of drusen / vitelliform like lesions OU  - The incidence, anatomy, and pathology of dry AMD, risk of progression, and the AREDS and AREDS 2 study including smoking risks discussed with patient.  - Recommend amsler grid monitoring  - f/u 3-4 months  4. Diabetes mellitus, type 2 without retinopathy  - last A1c: 6.7 on 11.12.21 - The incidence, risk factors for progression, natural history and treatment options for diabetic retinopathy  were discussed with patient.   - The need for close monitoring of blood glucose, blood pressure, and serum lipids, avoiding cigarette or any type of tobacco, and the need for long term follow up was also discussed with patient. - f/u in 1 year, sooner prn  5,6. Hypertensive retinopathy OU - discussed importance of tight BP control - monitor  7. Pseudophakia OU  - s/p CE/IOL OU ([place])  - IOL in good position, doing well - monitor - PCO OU (OD > OS) - will refer back to Dr. Herbert Deaner for yag eval OD   Ophthalmic Meds Ordered this visit:  No orders of the defined types were placed in this encounter.      Return in about 4 months (around 07/05/2021) for 4 mo f/u for VMT OU w/DFE&OCT.  There are no Patient Instructions on file for this visit.  This document serves as a record of services personally performed by Gardiner Sleeper, MD, PhD. It was created on their behalf by Leeann Must, [place], an ophthalmic technician. The creation of this record is the provider's dictation and/or activities  during the visit.    Electronically signed by: Leeann Must, COA @TODAY @ 12:50 PM  This document serves as a record of services personally performed by Gardiner Sleeper, MD, PhD. It was created on their behalf by Estill Bakes, COT an ophthalmic technician. The creation of this record is the provider's dictation and/or activities during the visit.    Electronically signed by: Estill Bakes, COT 4.11.22 @ 12:50 PM  Gardiner Sleeper, M.D., Ph.D. Diseases & Surgery of the Retina and [place] 03/05/2021   I have reviewed the above documentation for accuracy and completeness, and I agree with the above. Gardiner Sleeper, M.D., Ph.D. 03/05/21 12:50 PM  Abbreviations: M myopia (nearsighted); A astigmatism; H hyperopia (farsighted); P presbyopia; Mrx spectacle prescription;  CTL contact lenses; OD right eye; OS left  eye; OU both eyes  XT exotropia; ET esotropia; PEK punctate epithelial keratitis; PEE punctate epithelial erosions; DES dry eye syndrome; MGD meibomian gland dysfunction; ATs artificial tears; PFAT's preservative free artificial tears; [place] nuclear sclerotic cataract; PSC posterior subcapsular cataract; ERM epi-retinal membrane; PVD posterior vitreous detachment; RD retinal detachment; DM diabetes mellitus; DR diabetic retinopathy; NPDR non-proliferative diabetic retinopathy; PDR proliferative diabetic retinopathy; CSME clinically significant macular edema; DME diabetic macular edema; dbh dot blot hemorrhages; CWS cotton wool spot; POAG primary open angle glaucoma; C/D cup-to-disc ratio; HVF humphrey visual field; GVF goldmann visual field; OCT optical coherence tomography; IOP intraocular pressure; BRVO Branch retinal vein occlusion; CRVO central retinal vein occlusion; CRAO central retinal artery occlusion; BRAO branch retinal artery occlusion; RT retinal tear; SB scleral buckle; PPV pars plana vitrectomy; VH Vitreous hemorrhage; PRP panretinal laser  photocoagulation; IVK intravitreal kenalog; VMT vitreomacular traction; MH Macular hole;  NVD neovascularization of the disc; NVE neovascularization elsewhere; AREDS age related eye disease study; ARMD age related macular degeneration; POAG primary open angle glaucoma; EBMD epithelial/anterior basement membrane dystrophy; ACIOL anterior chamber intraocular lens; IOL intraocular lens; PCIOL posterior chamber intraocular lens; Phaco/IOL phacoemulsification with intraocular lens placement; [place] photorefractive keratectomy; LASIK laser assisted in situ keratomileusis; HTN hypertension; DM diabetes mellitus; COPD chronic obstructive pulmonary disease

## 2021-03-05 ENCOUNTER — Ambulatory Visit (INDEPENDENT_AMBULATORY_CARE_PROVIDER_SITE_OTHER): Payer: Medicare Other | Admitting: Ophthalmology

## 2021-03-05 ENCOUNTER — Encounter (INDEPENDENT_AMBULATORY_CARE_PROVIDER_SITE_OTHER): Payer: Self-pay | Admitting: Ophthalmology

## 2021-03-05 ENCOUNTER — Other Ambulatory Visit: Payer: Self-pay

## 2021-03-05 DIAGNOSIS — Z961 Presence of intraocular lens: Secondary | ICD-10-CM | POA: Diagnosis not present

## 2021-03-05 DIAGNOSIS — H353132 Nonexudative age-related macular degeneration, bilateral, intermediate dry stage: Secondary | ICD-10-CM

## 2021-03-05 DIAGNOSIS — H43823 Vitreomacular adhesion, bilateral: Secondary | ICD-10-CM | POA: Diagnosis not present

## 2021-03-05 DIAGNOSIS — H3581 Retinal edema: Secondary | ICD-10-CM | POA: Diagnosis not present

## 2021-03-05 DIAGNOSIS — E119 Type 2 diabetes mellitus without complications: Secondary | ICD-10-CM

## 2021-03-05 DIAGNOSIS — H35033 Hypertensive retinopathy, bilateral: Secondary | ICD-10-CM | POA: Diagnosis not present

## 2021-03-05 DIAGNOSIS — I10 Essential (primary) hypertension: Secondary | ICD-10-CM

## 2021-03-20 ENCOUNTER — Telehealth: Payer: Self-pay | Admitting: Cardiology

## 2021-03-20 DIAGNOSIS — I159 Secondary hypertension, unspecified: Secondary | ICD-10-CM

## 2021-03-20 MED ORDER — HYDRALAZINE HCL 25 MG PO TABS: 37.5000 mg | ORAL_TABLET | Freq: Three times a day (TID) | ORAL | 0 refills | Status: DC

## 2021-03-20 NOTE — Telephone Encounter (Signed)
Pt is completely out of the hydrALAZINE (APRESOLINE) 25 MG tablet [[id number]]  And the mail order will not have them delivered till next week.   Pt's wife came into office asking if we could send in a 7 day supply till they arrive through mail.   She will be out for a while, then return home

## 2021-03-28 ENCOUNTER — Other Ambulatory Visit: Payer: Self-pay | Admitting: Cardiology

## 2021-03-28 DIAGNOSIS — I159 Secondary hypertension, unspecified: Secondary | ICD-10-CM

## 2021-04-09 DIAGNOSIS — H43823 Vitreomacular adhesion, bilateral: Secondary | ICD-10-CM | POA: Diagnosis not present

## 2021-04-09 DIAGNOSIS — H26491 Other secondary cataract, right eye: Secondary | ICD-10-CM | POA: Diagnosis not present

## 2021-04-09 DIAGNOSIS — H26493 Other secondary cataract, bilateral: Secondary | ICD-10-CM | POA: Diagnosis not present

## 2021-04-09 DIAGNOSIS — H353132 Nonexudative age-related macular degeneration, bilateral, intermediate dry stage: Secondary | ICD-10-CM | POA: Diagnosis not present

## 2021-04-09 DIAGNOSIS — E119 Type 2 diabetes mellitus without complications: Secondary | ICD-10-CM | POA: Diagnosis not present

## 2021-05-31 ENCOUNTER — Telehealth: Payer: Self-pay | Admitting: Cardiology

## 2021-05-31 ENCOUNTER — Other Ambulatory Visit: Payer: Self-pay | Admitting: *Deleted

## 2021-05-31 MED ORDER — APIXABAN 5 MG PO TABS: 5.0000 mg | ORAL_TABLET | Freq: Two times a day (BID) | ORAL | 1 refills | Status: DC

## 2021-05-31 MED ORDER — EZETIMIBE 10 MG PO TABS: 10.0000 mg | ORAL_TABLET | Freq: Every day | ORAL | 3 refills | Status: AC

## 2021-05-31 MED ORDER — CARVEDILOL 6.25 MG PO TABS: 6.2500 mg | ORAL_TABLET | Freq: Two times a day (BID) | ORAL | 3 refills | Status: DC

## 2021-05-31 NOTE — Telephone Encounter (Signed)
New message      *STAT* If patient is at the pharmacy, call can be transferred to refill team.   1. Which medications need to be refilled? (please list name of each medication and dose if known) ezetimibe (ZETIA) 10 MG tablet   apixaban (ELIQUIS) 5 MG TABS tablet  sacubitril-valsartan (ENTRESTO) 49-51 MG   carvedilol (COREG) 6.25 MG tablet 2. Which pharmacy/location (including street and city if local pharmacy) is [facility]   3. Do they need a 30 day or 90 day supply? [place]

## 2021-05-31 NOTE — Telephone Encounter (Signed)
Medication refill request approved for Coreg 6.25 mg tablets and Zetia 10 mg tablets and sent to [facility]  Eliquis 5 mg tablets sent to Edrick Oh, RN for approval to fill.

## 2021-05-31 NOTE — Telephone Encounter (Signed)
Prescription refill request for Eliquis received. Indication: A Fib Last office visit: 02/22/21  Branch MD Scr: 1.30 on 10/08/20 Age: 87 Weight: 83kg  Based on above findings Eliquis 5mg  twice daily is the appropriate dose.  Refill approved.

## 2021-06-06 ENCOUNTER — Other Ambulatory Visit: Payer: Self-pay | Admitting: Family Medicine

## 2021-06-06 DIAGNOSIS — I159 Secondary hypertension, unspecified: Secondary | ICD-10-CM

## 2021-06-07 NOTE — Telephone Encounter (Signed)
Britney. Needs 6 mos ckup for July. Mail order sent

## 2021-07-05 NOTE — Progress Notes (Addendum)
[facility] Note  07/06/2021     CHIEF COMPLAINT Patient presents for Retina Follow Up   HISTORY OF PRESENT ILLNESS: Gary Odonnell is a 87 y.o. male who presents to the clinic today for:   HPI     Retina Follow Up   Patient presents with  Other.  In both eyes.  This started 4 months ago.  I, the attending physician,  performed the HPI with the patient and updated documentation appropriately.        Comments   Patient here for 4 months retina follow up for VMT OU. Patient states vision doing ok. OS has eye pain. Feels numb. Whole side of face numb.       Last edited by Bernarda Caffey, MD on 07/06/2021 11:36 AM.    Pt reports OS burns a lot.  Uses a Rx gtt and 2 otc gtts prn for dry eye.  Referring physician: Leticia Clas, [place]. [address] [place],  [place] [address]  HISTORICAL INFORMATION:   Selected notes from the MEDICAL RECORD NUMBER Referred by Dr. Rosana Hoes for eval of VMT   CURRENT MEDICATIONS: No current outpatient medications on file. (Ophthalmic Drugs)   No current facility-administered medications for this visit. (Ophthalmic Drugs)   Current Outpatient Medications (Other)  Medication Sig   albuterol (PROVENTIL) (2.5 MG/3ML) 0.083% nebulizer solution Take 3 mLs (2.5 mg total) by nebulization every 6 (six) hours as needed for shortness of breath.   albuterol (VENTOLIN HFA) 108 (90 Base) MCG/ACT inhaler Inhale 2 puffs into the lungs every 6 (six) hours as needed for wheezing or shortness of breath.   apixaban (ELIQUIS) 5 MG TABS tablet Take 1 tablet (5 mg total) by mouth 2 (two) times daily.   aspirin 81 MG EC tablet Take 1 tablet (81 mg total) by mouth daily.   carvedilol (COREG) 6.25 MG tablet Take 1 tablet (6.25 mg total) by mouth 2 (two) times daily with a meal.   ENTRESTO 49-51 MG TAKE 1 TABLET TWICE A DAY   ezetimibe (ZETIA) 10 MG tablet Take 1 tablet (10 mg total) by mouth daily.   furosemide (LASIX) 40 MG tablet TAKE 1  TABLET EVERY OTHER DAY ALTERNATING WITH ONE AND ONE-HALF TABLETS EVERY OTHER DAY   hydrALAZINE (APRESOLINE) 25 MG tablet TAKE 1 AND 1/2 TABLETS(37.5 MG) BY MOUTH EVERY 8 HOURS   nitroGLYCERIN (NITROSTAT) 0.3 MG SL tablet Place 1 tablet (0.3 mg total) under the tongue every 5 (five) minutes as needed for chest pain.   Potassium 99 MG TABS Take 1 tablet by mouth daily.    No current facility-administered medications for this visit. (Other)    REVIEW OF SYSTEMS:    ALLERGIES Allergies  Allergen Reactions   Statins Other (See Comments)    Intolerance per outpt Cardiology notes    PAST MEDICAL HISTORY Past Medical History:  Diagnosis Date   Atrial fibrillation ([place])    CHF (congestive heart failure) ([place])    a. EF 40-45% in 2015, at EF 30-35% by echo in 12/2018   COPD (chronic obstructive pulmonary disease) ([place])    Coronary artery disease    a. s/p CABG with most recent cath in 2014 showing patent LIMA-LAD, occluded Radial to OM1-OM2-PDA and occluded RCA with L--> R collaterals   Diabetic nephropathy (H[facility])    Essential hypertension    Hyperlipidemia    PAD (peripheral artery disease) ([place])    Pneumonia 09/2013   Primary cancer  of left lower lobe of lung ([place]) 02/02/2015   Radiation 02/2015   Spinal stenosis    Stroke [facility]) 11/2008   Type 2 diabetes mellitus ([place])    Past Surgical History:  Procedure Laterality Date   Angioplasty to Left femoral artery     CARPAL TUNNEL RELEASE Left 05/11/2014   Procedure: LEFT CARPAL TUNNEL RELEASE;  Surgeon: Carole Civil, MD;  Location: [facility];  Service: Orthopedics;  Laterality: Left;   COLONOSCOPY N/A 01/24/2015   Procedure: COLONOSCOPY;  Surgeon: Danie Binder, MD;  Location: [facility];  Service: Endoscopy;  Laterality: N/A;  130   CORONARY ARTERY BYPASS GRAFT     x4   FEMORAL-POPLITEAL BYPASS GRAFT Right 10/23/2015   Procedure: BYPASS GRAFT FEMORAL TO BELOW KNEE POPLITEAL ARTERY USING RIGHT NON-REVERSED GREATER SAPPHENOUS VEIN;   Surgeon: [name], MD;  Location: [facility];  Service: Vascular;  Laterality: Right;   INTRAOPERATIVE ARTERIOGRAM Right 10/23/2015   Procedure: INTRA OPERATIVE ARTERIOGRAM - RIGHT LOWER LEG;  Surgeon: [name], MD;  Location: [facility];  Service: Vascular;  Laterality: Right;   KNEE ARTHROSCOPY Right    LEFT AND RIGHT HEART CATHETERIZATION WITH CORONARY ANGIOGRAM N/A 09/29/2013   Procedure: LEFT AND RIGHT HEART CATHETERIZATION WITH CORONARY ANGIOGRAM;  Surgeon: Burnell Blanks, MD;  Location: [facility] C[facility] LAB;  Service: Cardiovascular;  Laterality: N/A;   LEFT HEART CATH AND CORS/GRAFTS ANGIOGRAPHY N/A 04/11/2020   Procedure: LEFT HEART CATH AND CORS/GRAFTS ANGIOGRAPHY;  Surgeon: Burnell Blanks, MD;  Location: [facility] CV LAB;  Service: Cardiovascular;  Laterality: N/A;   PERIPHERAL VASCULAR CATHETERIZATION N/A 04/21/2015   Procedure: Abdominal Aortogram w/Lower Extremity;  Surgeon: [name], MD;  Location: [facility] CV LAB;  Service: Cardiovascular;  Laterality: N/A;   Stent to right femoral artery     TRIGGER FINGER RELEASE Bilateral    VEIN HARVEST Right 10/23/2015   Procedure: [place];  Surgeon: [name], MD;  Location: [facility];  Service: Vascular;  Laterality: Right;    FAMILY HISTORY Family History  Problem Relation Age of Onset   Heart attack Father    Hyperlipidemia Father    Diabetes Mother    Diabetes Brother    Heart Problems Brother    Colon cancer Neg Hx     SOCIAL HISTORY Social History   Tobacco Use   Smoking status: Every Day    Packs/day: 0.50    Years: 60.00    Pack years: 30.00    Types: Cigars, Cigarettes    Start date: 11/21/1947   Smokeless tobacco: Never   Tobacco comments:    1-2 cigars per day   Vaping Use   Vaping Use: Never used  Substance Use Topics   Alcohol use: No    Alcohol/week: 0.0 standard drinks   Drug use: No         OPHTHALMIC EXAM:  Base Eye Exam     Visual  Acuity (Snellen - Linear)       Right Left   Dist cc 20/60 +2 20/50 -1   Dist ph cc NI NI    Correction: Glasses         Tonometry (Tonopen, 9:39 AM)       Right Left   Pressure 08 06         Pupils       Dark Light Shape React APD   Right 2 1 Round Minimal None   Left 2  1 Round Minimal None         Visual Fields (Counting fingers)       Left Right    Full Full         Extraocular Movement       Right Left    Full Full         Neuro/Psych     Oriented x3: Yes   Mood/Affect: Normal         Dilation     Both eyes: 1.0% Mydriacyl, 2.5% Phenylephrine @ 9:39 AM           Slit Lamp and Fundus Exam     Slit Lamp Exam       Right Left   Lids/Lashes Dermatochalasis - upper lid Dermatochalasis - upper lid, mild LL entropion   Conjunctiva/Sclera Conjunctivochalasis inferiorly White and quiet   Cornea arcus, +Trace PEE Punctate epithelial erosions, mild tear film debris arcus, 1+ Punctate epithelial erosions, mild tear film debris   Anterior Chamber Deep and quiet Deep and quiet   Iris Round and poorly dilated to 4.25 round and poorly dilated to 3.75   Lens PC IOL in good position, open PC PC IOL in good position; trace PCO   Vitreous Vitreous syneresis Vitreous syneresis         Fundus Exam       Right Left   Disc Mild Pallor, Sharp rim, +cupping Sharp rim, central Pallor, +cupping   C/D Ratio 0.75 0.75   Macula Flat, Blunted foveal reflex, Drusen, RPE mottling and clumping, no heme Flat, Blunted foveal reflex, Drusen, RPE mottling and clumping, no heme   Vessels attenuated, mild tortuousity attenuated, mild tortuousity   Periphery Attached, no heme    Attached, no heme              Refraction     Wearing Rx       Sphere Cylinder Axis Add   Right +0.25 +0.25 095 +3.00   Left +0.50 Sphere  +3.00            IMAGING AND PROCEDURES  Imaging and Procedures for 07/06/2021  OCT, Retina - OU - Both Eyes       Right  Eye Quality was good. Central Foveal Thickness: 295. Progression has been stable. Findings include abnormal foveal contour, no IRF, no SRF, vitreous traction, retinal drusen , subretinal hyper-reflective material (VMT stable, persistent central SRHM/vitelliform like lesion).   Left Eye Quality was good. Central Foveal Thickness: 301. Progression has been stable. Findings include abnormal foveal contour, intraretinal fluid, no SRF, retinal drusen , vitreous traction, subretinal hyper-reflective material (Stable VMT with cystic changes; Persistent central SRHM/drusen).   Notes *Images captured and stored on drive  Diagnosis / Impression:  VMT OU OD: VMT stable; persistent SRHM/vitelliform like lesion OS: Stable VMT with cystic changes; Persistent central SRHM/drusen  Clinical management:  See below  Abbreviations: NFP - Normal foveal profile. CME - cystoid macular edema. PED - pigment epithelial detachment. IRF - intraretinal fluid. SRF - subretinal fluid. EZ - ellipsoid zone. ERM - epiretinal membrane. ORA - outer retinal atrophy. ORT - outer retinal tubulation. SRHM - subretinal hyper-reflective material. IRHM - intraretinal hyper-reflective material            ASSESSMENT/PLAN:    ICD-10-CM   1. Vitreomacular traction syndrome of both eyes  H43.823     2. Retinal edema  H35.81 OCT, Retina - OU - Both Eyes    3. Intermediate stage nonexudative age-related macular degeneration  of both eyes  H35.3132     4. Diabetes mellitus type 2 without retinopathy ([place])  E11.9     5. Essential hypertension  I10     6. Hypertensive retinopathy of both eyes  H35.033     7. Pseudophakia, both eyes  Z96.1      1,2. VMT OU  - pt was seen by Dr. Zigmund Daniel in 2018  - OCT today shows OD: VMT stable; persistent SRHM/vitelliform like lesion                                              OS: Stable VMT with cystic changes; Persistent central SRHM/drusen  - BCVA OD stable at 20/60                           OS stable at 20/50   - discussed findings; no progression  - continue monitoring  - f/u in 6 mos  3. Age related macular degeneration, non-exudative, both eyes  - persistent drusen / vitelliform like lesions OU -- stable  - The incidence, anatomy, and pathology of dry AMD, risk of progression, and the AREDS and AREDS 2 study including smoking risks discussed with patient.  - Recommend amsler grid monitoring  - f/u 6 months  4. Diabetes mellitus, type 2 without retinopathy  - last A1c: 6.7 on 11.12.21 - The incidence, risk factors for progression, natural history and treatment options for diabetic retinopathy  were discussed with patient.   - The need for close monitoring of blood glucose, blood pressure, and serum lipids, avoiding cigarette or any type of tobacco, and the need for long term follow up was also discussed with patient. - f/u in 1 year, sooner prn  5,6. Hypertensive retinopathy OU - discussed importance of tight BP control - monitor  7. Pseudophakia OU  - s/p CE/IOL OU Herbert Deaner)  - IOL in good position, doing well - monitor  - s/p YAG OD performed by Dr. Herbert Deaner    Ophthalmic Meds Ordered this visit:  No orders of the defined types were placed in this encounter.     Return in about 6 months (around 01/06/2022) for 6 months VMT OU, DFE/OCT.  There are no Patient Instructions on file for this visit.  This document serves as a record of services personally performed by Gardiner Sleeper, MD, PhD. It was created on their behalf by Leonie Douglas, an ophthalmic technician. The creation of this record is the provider's dictation and/or activities during the visit.    Electronically signed by: Leonie Douglas COA, 07/06/21  11:58 AM  This document serves as a record of services personally performed by Gardiner Sleeper, MD, PhD. It was created on their behalf by Leeann Must, [place], an ophthalmic technician. The creation of this record is the provider's dictation and/or  activities during the visit.    Electronically signed by: Leeann Must, COA @TODAY @ 11:58 AM  Gardiner Sleeper, M.D., Ph.D. Diseases & Surgery of the Retina and [place] 07/06/2021   I have reviewed the above documentation for accuracy and completeness, and I agree with the above. Gardiner Sleeper, M.D., Ph.D. 07/06/21 11:58 AM  Abbreviations: M myopia (nearsighted); A astigmatism; H hyperopia (farsighted); P presbyopia; Mrx spectacle prescription;  CTL contact lenses; OD right eye; OS left eye; OU  both eyes  XT exotropia; ET esotropia; PEK punctate epithelial keratitis; PEE punctate epithelial erosions; DES dry eye syndrome; MGD meibomian gland dysfunction; ATs artificial tears; PFAT's preservative free artificial tears; [place] nuclear sclerotic cataract; PSC posterior subcapsular cataract; ERM epi-retinal membrane; PVD posterior vitreous detachment; RD retinal detachment; DM diabetes mellitus; DR diabetic retinopathy; NPDR non-proliferative diabetic retinopathy; PDR proliferative diabetic retinopathy; CSME clinically significant macular edema; DME diabetic macular edema; dbh dot blot hemorrhages; CWS cotton wool spot; POAG primary open angle glaucoma; C/D cup-to-disc ratio; HVF humphrey visual field; GVF goldmann visual field; OCT optical coherence tomography; IOP intraocular pressure; BRVO Branch retinal vein occlusion; CRVO central retinal vein occlusion; CRAO central retinal artery occlusion; BRAO branch retinal artery occlusion; RT retinal tear; SB scleral buckle; PPV pars plana vitrectomy; VH Vitreous hemorrhage; PRP panretinal laser photocoagulation; IVK intravitreal kenalog; VMT vitreomacular traction; MH Macular hole;  NVD neovascularization of the disc; NVE neovascularization elsewhere; AREDS age related eye disease study; ARMD age related macular degeneration; POAG primary open angle glaucoma; EBMD epithelial/anterior basement membrane dystrophy; ACIOL anterior  chamber intraocular lens; IOL intraocular lens; PCIOL posterior chamber intraocular lens; Phaco/IOL phacoemulsification with intraocular lens placement; [place] photorefractive keratectomy; LASIK laser assisted in situ keratomileusis; HTN hypertension; DM diabetes mellitus; COPD chronic obstructive pulmonary disease

## 2021-07-06 ENCOUNTER — Other Ambulatory Visit: Payer: Self-pay

## 2021-07-06 ENCOUNTER — Encounter (INDEPENDENT_AMBULATORY_CARE_PROVIDER_SITE_OTHER): Payer: Self-pay | Admitting: Ophthalmology

## 2021-07-06 ENCOUNTER — Ambulatory Visit (INDEPENDENT_AMBULATORY_CARE_PROVIDER_SITE_OTHER): Payer: Medicare Other | Admitting: Ophthalmology

## 2021-07-06 DIAGNOSIS — H353132 Nonexudative age-related macular degeneration, bilateral, intermediate dry stage: Secondary | ICD-10-CM

## 2021-07-06 DIAGNOSIS — I10 Essential (primary) hypertension: Secondary | ICD-10-CM

## 2021-07-06 DIAGNOSIS — H3581 Retinal edema: Secondary | ICD-10-CM

## 2021-07-06 DIAGNOSIS — Z961 Presence of intraocular lens: Secondary | ICD-10-CM

## 2021-07-06 DIAGNOSIS — E119 Type 2 diabetes mellitus without complications: Secondary | ICD-10-CM | POA: Diagnosis not present

## 2021-07-06 DIAGNOSIS — H43823 Vitreomacular adhesion, bilateral: Secondary | ICD-10-CM

## 2021-07-06 DIAGNOSIS — H35033 Hypertensive retinopathy, bilateral: Secondary | ICD-10-CM | POA: Diagnosis not present

## 2021-07-06 LAB — HM DIABETES EYE EXAM

## 2021-07-09 DIAGNOSIS — H40013 Open angle with borderline findings, low risk, bilateral: Secondary | ICD-10-CM | POA: Diagnosis not present

## 2021-07-09 DIAGNOSIS — H35033 Hypertensive retinopathy, bilateral: Secondary | ICD-10-CM | POA: Diagnosis not present

## 2021-07-10 ENCOUNTER — Encounter: Payer: Self-pay | Admitting: Family Medicine

## 2021-08-14 ENCOUNTER — Other Ambulatory Visit: Payer: Self-pay | Admitting: Family Medicine

## 2021-08-14 DIAGNOSIS — I159 Secondary hypertension, unspecified: Secondary | ICD-10-CM

## 2021-08-21 ENCOUNTER — Telehealth: Payer: Self-pay | Admitting: Cardiology

## 2021-08-21 ENCOUNTER — Other Ambulatory Visit: Payer: Self-pay | Admitting: *Deleted

## 2021-08-21 DIAGNOSIS — I159 Secondary hypertension, unspecified: Secondary | ICD-10-CM

## 2021-08-21 MED ORDER — HYDRALAZINE HCL 25 MG PO TABS: ORAL_TABLET | ORAL | 3 refills | Status: DC

## 2021-08-21 MED ORDER — HYDRALAZINE HCL 25 MG PO TABS: 37.5000 mg | ORAL_TABLET | Freq: Three times a day (TID) | ORAL | 0 refills | Status: DC

## 2021-08-21 NOTE — Telephone Encounter (Signed)
Done

## 2021-08-21 NOTE — Telephone Encounter (Signed)
Pt is needing his hydrALAZINE (APRESOLINE) 25 MG tablet [[id number]] sent to [facility] in [place] because his order from [facility] has not arrived and he has not had any of this medication in a few days

## 2021-08-27 ENCOUNTER — Encounter: Payer: Self-pay | Admitting: Cardiology

## 2021-08-27 ENCOUNTER — Other Ambulatory Visit: Payer: Self-pay

## 2021-08-27 ENCOUNTER — Other Ambulatory Visit: Payer: Self-pay | Admitting: Family Medicine

## 2021-08-27 ENCOUNTER — Ambulatory Visit (INDEPENDENT_AMBULATORY_CARE_PROVIDER_SITE_OTHER): Payer: Medicare Other | Admitting: Cardiology

## 2021-08-27 VITALS — BP 126/54 | HR 69 | Ht 71.0 in | Wt 178.0 lb

## 2021-08-27 DIAGNOSIS — I5022 Chronic systolic (congestive) heart failure: Secondary | ICD-10-CM

## 2021-08-27 DIAGNOSIS — I482 Chronic atrial fibrillation, unspecified: Secondary | ICD-10-CM | POA: Diagnosis not present

## 2021-08-27 DIAGNOSIS — E782 Mixed hyperlipidemia: Secondary | ICD-10-CM

## 2021-08-27 DIAGNOSIS — I2510 Atherosclerotic heart disease of native coronary artery without angina pectoris: Secondary | ICD-10-CM | POA: Diagnosis not present

## 2021-08-27 DIAGNOSIS — R7309 Other abnormal glucose: Secondary | ICD-10-CM

## 2021-08-27 DIAGNOSIS — I10 Essential (primary) hypertension: Secondary | ICD-10-CM | POA: Diagnosis not present

## 2021-08-27 DIAGNOSIS — I159 Secondary hypertension, unspecified: Secondary | ICD-10-CM

## 2021-08-27 DIAGNOSIS — I6529 Occlusion and stenosis of unspecified carotid artery: Secondary | ICD-10-CM | POA: Diagnosis not present

## 2021-08-27 MED ORDER — SPIRONOLACTONE 25 MG PO TABS
12.5000 mg | ORAL_TABLET | Freq: Every day | ORAL | 6 refills | Status: DC
Start: 2021-08-27 — End: 2021-09-03

## 2021-08-27 MED ORDER — FUROSEMIDE 40 MG PO TABS: 20.0000 mg | ORAL_TABLET | Freq: Every day | ORAL | Status: DC

## 2021-08-27 NOTE — Addendum Note (Signed)
Addended by: Laurine Blazer on: 08/27/2021 10:17 AM   Modules accepted: Orders

## 2021-08-27 NOTE — Addendum Note (Signed)
Addended by: Laurine Blazer on: 08/27/2021 10:04 AM   Modules accepted: Orders

## 2021-08-27 NOTE — Progress Notes (Signed)
Clinical Summary Gary Odonnell is a 87 y.o.maleseen today for follow up of the following medical problems.    1. CAD/ICM/Chronic systolic heart failure - history of prior CABG 2003 in [place]   - 09/2013 admission to [facility] with decompensated heart failure. Workup included an echo which showed an LVEF of 15%. He was transferred to [facility] for further management.   - cath 09/2013 showed LM 30%, LAD 100% proximal, LCX 50% ostial, RCA 100% mid with distal vessel filling with right to right and left to right collaterals. LIMA-LAD patent, graft from [place] to OM1, OM2, PDA occluded. No PCI targets, recommendations for medical management. RHC mean PA 43, wedge 22, CI 1.74.   - repeat echo 04/2014 shows LVEF improved to 40-45%.    - 01/2018 echo LVEF 35-40%, grade II diastolic dysfunction.              -admit 03/2020 with NSTEMI,actually presented with fall out of bed and N/V, no chest pain 03/2020 cath: severe 3 vessel disease, 1/4 patent grafts as reported below. Overall stable disease, recs for medical therapy - 03/2020 echo LVEF 35-40%, grade III DDx, PASP 56    -09/2020 echo LVEF 40-45%    - compliant with meds, given extensive CAD has been on eliquis and ASA - no recent SOB/DOE. No recent edema. Occasinal dizziness with standing, though infrequent. Down 21 lbs since 10/16/20.  -had not been on aldactone due to prior renal dysfunction which has improved    2. Afib - new diagnosis during 12/27/2018 ER visit. Eliquis started in clniic 12/29/17 - CHADS2Vasc score is (age x2, CHF, HTN, stroke x2, CAD) is 7     - no recent palpitations - no bleeding on eliquis     3. Hyperlipidemia   - lipitor and crestor have caused prior muscle aches, not on statin   - 06/2017 TC 196 TG 105 HDL 36 LDL 139  . Has been resistant to pcsk9 inhibitors.    - has not had recent labs.     4. Lung CA - has completed radiation treatment.   - followed by Dr Dwana Curd   5. PAD - followed by  vascular, previous stenting to left external iliac and right common iliac.   - s/p right femoral to below-knee popliteal bypass with vein 10/23/2015.  - some legs pains at about 1/2 block, thighs which is stable.  - he is not interetsted in seeing vascular at this time   6. HTN - he is compliant with meds     7. Hypothyroidism - patient has been reluctant for treatment. - followed by pcp   Past Medical History:  Diagnosis Date   Atrial fibrillation ([place])    CHF (congestive heart failure) ([place])    a. EF 40-45% in 2015, at EF 30-35% by echo in 12/2018   COPD (chronic obstructive pulmonary disease) ([place])    Coronary artery disease    a. s/p CABG with most recent cath in 2014 showing patent LIMA-LAD, occluded Radial to OM1-OM2-PDA and occluded RCA with L--> R collaterals   Diabetic nephropathy ([place])    Essential hypertension    Hyperlipidemia    PAD (peripheral artery disease) ([place])    Pneumonia 09/2013   Primary cancer of left lower lobe of lung ([place]) 02/02/2015   Radiation 02/2015   Spinal stenosis    Stroke ([place]) 11/2008   Type 2 diabetes mellitus ([facility])      Allergies  Allergen Reactions  Statins Other (See Comments)    Intolerance per outpt Cardiology notes     Current Outpatient Medications  Medication Sig Dispense Refill   albuterol (PROVENTIL) (2.5 MG/3ML) 0.083% nebulizer solution Take 3 mLs (2.5 mg total) by nebulization every 6 (six) hours as needed for shortness of breath. 75 mL 12   albuterol (VENTOLIN HFA) 108 (90 Base) MCG/ACT inhaler Inhale 2 puffs into the lungs every 6 (six) hours as needed for wheezing or shortness of breath. 8 g 0   apixaban (ELIQUIS) 5 MG TABS tablet Take 1 tablet (5 mg total) by mouth 2 (two) times daily. 180 tablet 1   aspirin 81 MG EC tablet Take 1 tablet (81 mg total) by mouth daily. 30 tablet 0   carvedilol (COREG) 6.25 MG tablet Take 1 tablet (6.25 mg total) by mouth 2 (two) times daily with a meal. 180 tablet 3   ENTRESTO 49-51  MG TAKE 1 TABLET TWICE A DAY 180 tablet 0   ezetimibe (ZETIA) 10 MG tablet Take 1 tablet (10 mg total) by mouth daily. 90 tablet 3   furosemide (LASIX) 40 MG tablet TAKE 1 TABLET EVERY OTHER DAY ALTERNATING WITH ONE AND ONE-HALF TABLETS EVERY OTHER DAY 120 tablet 3   hydrALAZINE (APRESOLINE) 25 MG tablet Take 1.5 tablets (37.5 mg total) by mouth 3 (three) times daily. 135 tablet 0   nitroGLYCERIN (NITROSTAT) 0.3 MG SL tablet Place 1 tablet (0.3 mg total) under the tongue every 5 (five) minutes as needed for chest pain. 100 tablet 3   Potassium 99 MG TABS Take 1 tablet by mouth daily.      No current facility-administered medications for this visit.     Past Surgical History:  Procedure Laterality Date   Angioplasty to Left femoral artery     CARPAL TUNNEL RELEASE Left 05/11/2014   Procedure: LEFT CARPAL TUNNEL RELEASE;  Surgeon: Carole Civil, MD;  Location: [facility];  Service: Orthopedics;  Laterality: Left;   COLONOSCOPY N/A 01/24/2015   Procedure: COLONOSCOPY;  Surgeon: Danie Binder, MD;  Location: [facility];  Service: Endoscopy;  Laterality: N/A;  130   CORONARY ARTERY BYPASS GRAFT     x4   FEMORAL-POPLITEAL BYPASS GRAFT Right 10/23/2015   Procedure: BYPASS GRAFT FEMORAL TO BELOW KNEE POPLITEAL ARTERY USING RIGHT NON-REVERSED GREATER SAPPHENOUS VEIN;  Surgeon: [name], MD;  Location: [place];  Service: Vascular;  Laterality: Right;   INTRAOPERATIVE ARTERIOGRAM Right 10/23/2015   Procedure: INTRA OPERATIVE ARTERIOGRAM - RIGHT LOWER LEG;  Surgeon: [name], MD;  Location: [place];  Service: Vascular;  Laterality: Right;   KNEE ARTHROSCOPY Right    LEFT AND RIGHT HEART CATHETERIZATION WITH CORONARY ANGIOGRAM N/A 09/29/2013   Procedure: LEFT AND RIGHT HEART CATHETERIZATION WITH CORONARY ANGIOGRAM;  Surgeon: Burnell Blanks, MD;  Location: [facility] CATH LAB;  Service: Cardiovascular;  Laterality: N/A;   LEFT HEART CATH AND CORS/GRAFTS ANGIOGRAPHY N/A 04/11/2020    Procedure: LEFT HEART CATH AND CORS/GRAFTS ANGIOGRAPHY;  Surgeon: Burnell Blanks, MD;  Location: [place] CV LAB;  Service: Cardiovascular;  Laterality: N/A;   PERIPHERAL VASCULAR CATHETERIZATION N/A 04/21/2015   Procedure: Abdominal Aortogram w/Lower Extremity;  Surgeon: [name], MD;  Location: [place] CV LAB;  Service: Cardiovascular;  Laterality: N/A;   Stent to right femoral artery     TRIGGER FINGER RELEASE Bilateral    VEIN HARVEST Right 10/23/2015   Procedure: [place];  Surgeon: [name], MD;  Location: [facility] OR;  Service: Vascular;  Laterality: Right;     Allergies  Allergen Reactions   Statins Other (See Comments)    Intolerance per outpt Cardiology notes      Family History  Problem Relation Age of Onset   Heart attack Father    Hyperlipidemia Father    Diabetes Mother    Diabetes Brother    Heart Problems Brother    Colon cancer Neg Hx      Social History Gary Odonnell reports that he has been smoking cigars. He started smoking about 71 years ago. He has a 30.00 pack-year smoking history. He has never used smokeless tobacco. Gary Odonnell reports no history of alcohol use.   Review of Systems CONSTITUTIONAL: No weight loss, fever, chills, weakness or fatigue.  HEENT: Eyes: No visual loss, blurred vision, double vision or yellow sclerae.No hearing loss, sneezing, congestion, runny nose or sore throat.  SKIN: No rash or itching.  CARDIOVASCULAR: per hpi RESPIRATORY: No shortness of breath, cough or sputum.  GASTROINTESTINAL: No anorexia, nausea, vomiting or diarrhea. No abdominal pain or blood.  GENITOURINARY: No burning on urination, no polyuria NEUROLOGICAL: No headache, dizziness, syncope, paralysis, ataxia, numbness or tingling in the extremities. No change in bowel or bladder control.  MUSCULOSKELETAL: No muscle, back pain, joint pain or stiffness.  LYMPHATICS: No enlarged nodes. No history of  splenectomy.  PSYCHIATRIC: No history of depression or anxiety.  ENDOCRINOLOGIC: No reports of sweating, cold or heat intolerance. No polyuria or polydipsia.  Marland Kitchen   Physical Examination Today's Vitals   08/27/21 0914  BP: (!) 126/54  Pulse: 69  Weight: 178 lb (80.7 kg)  Height: 5\' 11"  (1.803 m)   Body mass index is 24.83 kg/m.  Gen: resting comfortably, no acute distress HEENT: no scleral icterus, pupils equal round and reactive, no palptable cervical adenopathy,  CV: irreg, no m/r/g no jvd Resp: Clear to auscultation bilaterally GI: abdomen is soft, non-tender, non-distended, normal bowel sounds, no hepatosplenomegaly MSK: extremities are warm, no edema.  Skin: warm, no rash Neuro:  no focal deficits Psych: appropriate affect   Diagnostic Studies 09/29/13 Cath   Hemodynamic Findings:   Ao: 162/70   LV: 163/19/29   RA: 12  RV: 66/7/16   PA: 67/26 (mean 43)   PCWP: 22   Fick Cardiac Output: 3.5 L/min   Fick Cardiac Index: 1.74 L/min/m2   Central Aortic Saturation: 91%   Pulmonary Artery Saturation: 50%   Angiographic Findings:   Left main: Distal 30% stenosis.   Left Anterior Descending Artery: 100% proximal occlusion. Proximal, mid and distal vessel fills from the patent IMA graft.   Circumflex Artery: Large caliber vessel with large intermediate Kyran Connaughton. Diffuse non-obstructive plaque in the large bifurcating intermediate Rane Blitch. The ostium of the Circumflex has 50% stenosis.   Right Coronary Artery: Large dominant vessel with 100% mid occlusion. The distal vessel fills right to right bridging collaterals and left to right collaterals.   Graft Anatomy:  LIMA to LAD is patent   Free Radial (? Y graft from LIMA) to OM1, OM2, PDA is occluded.   Left Ventricular Angiogram: Deferred.   Impression:   1. Triple vessel CAD with patent LIMA to LAD, occluded RCA with good collateral filling, moderate disease in Circumflex   2. No focal targets for PCI   3. Ischemic  cardiomyopathy with acute systolic CHF     35/3/61 Echo   LVEF 15-20%, moderate LVH, severe diffuse hypokinesis, restrictive diastolic dysfunction, multiple WMAs, moderate MR,  severe RV dysfunction RV TAPSE 0.7 cm, PASP 61 mmHg.     12/2013 Lab   TC 175 TG 111 LDL 116 HDL 37   TSH 7.7 HgbA1c 6.6 Na 143 K 4.5 Cr 1.22 GFR56    04/2014 Echo Study Conclusions  - Left ventricle: The cavity size was normal. Wall thickness was increased in a pattern of mild LVH. Systolic function was mildly to moderately reduced. The estimated ejection fraction was in the range of 40% to 45%. There is diastolic dysfunction, indeterminant grade. - Regional wall motion abnormality: Hypokinesis of the mid anterior and basal-mid anteroseptal myocardium. - Aortic valve: Moderately calcified annulus. Trileaflet; moderately thickened leaflets. Valve area (VTI): 2.48 cm^2. Valve area (Vmax): 2.23 cm^2. - Mitral valve: Moderately calcified annulus. Mildly thickened leaflets . - Left atrium: The atrium was moderately dilated. - Right atrium: The atrium was mildly dilated. - Technically adequate study.     01/2015 Carotid US bilaterall 1-39%       01/2018 echo Study Conclusions   - Left ventricle: The cavity size was normal. Wall thickness was   increased in a pattern of moderate LVH. Systolic function was   moderately reduced. The estimated ejection fraction was in the   range of 35% to 40%. Diffuse hypokinesis. Features are consistent   with a pseudonormal left ventricular filling pattern, with   concomitant abnormal relaxation and increased filling pressure   (grade 2 diastolic dysfunction). Doppler parameters are   consistent with high ventricular filling pressure. - Regional wall motion abnormality: Moderate hypokinesis of the   basal-mid anteroseptal, mid inferoseptal, and mid inferior   myocardium. - Aortic valve: Moderately calcified annulus. Mildly thickened,   mildly calcified leaflets. There  appears to be at least mild (if   not mild to moderate) calcific aortic valvular stenosis. I   suspect peak velocity and mean gradient is underestimated due to   depressed LV function. Peak velocity (S): 159 cm/s. Mean gradient   (S): 6 mm Hg. Valve area (VTI): 1.66 cm^2. Valve area (Vmax):   1.73 cm^2. Valve area (Vmean): 1.52 cm^2. - Mitral valve: Calcified annulus. Restricted leaflet motion due to   depressed LV function. There was mild regurgitation. Valve area   by continuity equation (using LVOT flow): 1.21 cm^2. - Left atrium: The atrium was mildly dilated. - Right ventricle: Systolic function was moderately reduced. - Tricuspid valve: There was mild regurgitation   03/2020 cath Prox RCA lesion is 100% stenosed. Prox LAD lesion is 100% stenosed. LIMA graft was visualized by angiography and is normal in caliber. Dist Cx lesion is 90% stenosed. Ost Cx to Prox Cx lesion is 70% stenosed. Lat Ramus lesion is 70% stenosed. Ramus lesion is 70% stenosed.   1. Severe triple vessel CAD s/p 4V CABG with 1/4 patent grafts.  2. Occluded proximal LAD. Patent LIMA to LAD 3. Moderately severe proximal Circumflex stenosis, unchanged from last cath. Severe distal Circumflex disease. Too small for PCI.  4. Moderately severe intermediate disease, grossly unchanged since last cath  5. Chronically occluded proximal RCA. The distal vessel fills from left to right collaterals.  6. Normal filling pressures   Recommendations: He has moderately severe disease in the intermediate Hayleen Clinkscales and the Circumflex. His LAD is chronically occluded but the LIMA graft to the mid LAD is patent. The RCA is chronically occluded and fills from left to right collaterals. There has been progression of his disease in the intermediate and Circumflex, however, in the absence of angina, I would continue  medical management of his CAD at this time.     Assessment and Plan  1. CAD/Chronic systolic HF - have continued ASA along  with his eliquis due to extensive history of CAD, cath last year with NSTEMI severe 3 vessel disease 1/4 patent grafts - with improved renal fucntion start aldactone 12.5mg  daily, check labs 2 weeks. At f/u consider farxiga.    2. Afib - no recent symptoms, he will continue current meds - EKG today shows rate controlled afib     3. HTN - he is at goal, continue current meds   4. Hyperlipidemia - statin intolerant, has refused pcsk9-Is - continue zetia -we will repeat labs.       Arnoldo Lenis, M.D.

## 2021-08-27 NOTE — Telephone Encounter (Signed)
Gary Odonnell. NTBS last OV Jan 2022. Mail order not sent

## 2021-08-27 NOTE — Patient Instructions (Addendum)
Medication Instructions:  Begin Aldactone 12.5mg  daily.  Decrease Lasix to 20mg  daily, may take an additional 20mg  as needed for swelling.   Continue all other medications.     Labwork: BMET, CBC, TSH, FLP, HgbA1c, Mg   Testing/Procedures: Your physician has requested that you have a carotid duplex. This test is an ultrasound of the carotid arteries in your neck. It looks at blood flow through these arteries that supply the brain with blood. Allow one hour for this exam. There are no restrictions or special instructions. Office will contact with results via phone or letter.     Follow-Up: 3 months   Any Other Special Instructions Will Be Listed Below (If Applicable).   If you need a refill on your cardiac medications before your next appointment, please call your pharmacy.

## 2021-08-31 ENCOUNTER — Other Ambulatory Visit (HOSPITAL_COMMUNITY)
Admission: RE | Admit: 2021-08-31 | Discharge: 2021-08-31 | Disposition: A | Discharge status: Home / Self Care | Payer: Medicare Other | Source: Ambulatory Visit | Attending: Cardiology | Admitting: Cardiology

## 2021-08-31 DIAGNOSIS — R7309 Other abnormal glucose: Secondary | ICD-10-CM | POA: Insufficient documentation

## 2021-08-31 DIAGNOSIS — I10 Essential (primary) hypertension: Secondary | ICD-10-CM | POA: Diagnosis not present

## 2021-08-31 DIAGNOSIS — I482 Chronic atrial fibrillation, unspecified: Secondary | ICD-10-CM | POA: Insufficient documentation

## 2021-08-31 DIAGNOSIS — E782 Mixed hyperlipidemia: Secondary | ICD-10-CM | POA: Diagnosis not present

## 2021-08-31 DIAGNOSIS — I5022 Chronic systolic (congestive) heart failure: Secondary | ICD-10-CM | POA: Diagnosis not present

## 2021-08-31 LAB — TSH: TSH: 12.023 u[IU]/mL — ABNORMAL HIGH (ref 0.350–4.500)

## 2021-08-31 LAB — LIPID PANEL
Cholesterol: 164 mg/dL (ref 0–200)
HDL: 36 mg/dL — ABNORMAL LOW (ref >40)
LDL Cholesterol: 117 mg/dL — ABNORMAL HIGH (ref 0–99)
Total CHOL/HDL Ratio: 4.6 RATIO
Triglycerides: 53 mg/dL (ref <150)
VLDL: 11 mg/dL (ref 0–40)

## 2021-08-31 LAB — HEMOGLOBIN A1C
Hgb A1c MFr Bld: 6.3 % — ABNORMAL HIGH (ref 4.8–5.6)
Mean Plasma Glucose: 134.11 mg/dL

## 2021-08-31 LAB — BASIC METABOLIC PANEL
Anion gap: 7 (ref 5–15)
BUN: 18 mg/dL (ref 8–23)
CO2: 29 mmol/L (ref 22–32)
Calcium: 9.4 mg/dL (ref 8.9–10.3)
Chloride: 108 mmol/L (ref 98–111)
Creatinine, Ser: 1.33 mg/dL — ABNORMAL HIGH (ref 0.61–1.24)
GFR, Estimated: 52 mL/min — ABNORMAL LOW (ref >60)
Glucose, Bld: 140 mg/dL — ABNORMAL HIGH (ref 70–99)
Potassium: 4.9 mmol/L (ref 3.5–5.1)
Sodium: 144 mmol/L (ref 135–145)

## 2021-08-31 LAB — CBC
HCT: 38.3 % — ABNORMAL LOW (ref 39.0–52.0)
Hemoglobin: 11.6 g/dL — ABNORMAL LOW (ref 13.0–17.0)
MCH: 30.7 pg (ref 26.0–34.0)
MCHC: 30.3 g/dL (ref 30.0–36.0)
MCV: 101.3 fL — ABNORMAL HIGH (ref 80.0–100.0)
Platelets: 157 10*3/uL (ref 150–400)
RBC: 3.78 MIL/uL — ABNORMAL LOW (ref 4.22–5.81)
RDW: 14.6 % (ref 11.5–15.5)
WBC: 7.3 10*3/uL (ref 4.0–10.5)
nRBC: 0 % (ref 0.0–0.2)

## 2021-08-31 LAB — MAGNESIUM: Magnesium: 2.3 mg/dL (ref 1.7–2.4)

## 2021-09-03 ENCOUNTER — Telehealth: Payer: Self-pay | Admitting: Cardiology

## 2021-09-03 ENCOUNTER — Other Ambulatory Visit: Payer: Self-pay | Admitting: *Deleted

## 2021-09-03 DIAGNOSIS — I159 Secondary hypertension, unspecified: Secondary | ICD-10-CM

## 2021-09-03 MED ORDER — HYDRALAZINE HCL 25 MG PO TABS: 37.5000 mg | ORAL_TABLET | Freq: Three times a day (TID) | ORAL | 2 refills | Status: DC

## 2021-09-03 MED ORDER — DAPAGLIFLOZIN PROPANEDIOL 10 MG PO TABS: 10.0000 mg | ORAL_TABLET | Freq: Every day | ORAL | 6 refills | Status: DC

## 2021-09-03 NOTE — Telephone Encounter (Signed)
Pt notified and verbalized understanding. Medication list/allergy list updated to reflect changes made.

## 2021-09-03 NOTE — Telephone Encounter (Signed)
Can stop aldactone, can we try starting him on farxiga 10mg  daily. This is another medication that helps strengthen the heart. List aldactone in allergy list  J Jathniel Smeltzer MD

## 2021-09-03 NOTE — Telephone Encounter (Signed)
Pt stated that he had problems with diarrhea while taking the Spironolactone, but once he stopped the diarrhea went away. Pt is not having any other problems with medication. Please advise.

## 2021-09-03 NOTE — Telephone Encounter (Signed)
Pt's wife called stating that the spironolactone (ALDACTONE) 25 MG tablet [[id number]] gave the pt diarrhea and he cannot take it.   Please call [phone]

## 2021-09-04 ENCOUNTER — Encounter (HOSPITAL_COMMUNITY): Payer: Self-pay | Admitting: Emergency Medicine

## 2021-09-04 ENCOUNTER — Other Ambulatory Visit: Payer: Self-pay

## 2021-09-04 ENCOUNTER — Inpatient Hospital Stay (HOSPITAL_COMMUNITY)
Admission: EM | Admit: 2021-09-04 | Discharge: 2021-09-06 | DRG: 291 | Disposition: A | Discharge status: Home / Self Care | Payer: Medicare Other | Attending: Family Medicine | Admitting: Family Medicine

## 2021-09-04 ENCOUNTER — Emergency Department (HOSPITAL_COMMUNITY): Payer: Medicare Other

## 2021-09-04 DIAGNOSIS — I2510 Atherosclerotic heart disease of native coronary artery without angina pectoris: Secondary | ICD-10-CM | POA: Diagnosis present

## 2021-09-04 DIAGNOSIS — Z833 Family history of diabetes mellitus: Secondary | ICD-10-CM

## 2021-09-04 DIAGNOSIS — E1151 Type 2 diabetes mellitus with diabetic peripheral angiopathy without gangrene: Secondary | ICD-10-CM | POA: Diagnosis present

## 2021-09-04 DIAGNOSIS — Z79899 Other long term (current) drug therapy: Secondary | ICD-10-CM

## 2021-09-04 DIAGNOSIS — Z951 Presence of aortocoronary bypass graft: Secondary | ICD-10-CM

## 2021-09-04 DIAGNOSIS — Z85118 Personal history of other malignant neoplasm of bronchus and lung: Secondary | ICD-10-CM

## 2021-09-04 DIAGNOSIS — I5043 Acute on chronic combined systolic (congestive) and diastolic (congestive) heart failure: Secondary | ICD-10-CM | POA: Diagnosis present

## 2021-09-04 DIAGNOSIS — F17290 Nicotine dependence, other tobacco product, uncomplicated: Secondary | ICD-10-CM | POA: Diagnosis present

## 2021-09-04 DIAGNOSIS — Z8249 Family history of ischemic heart disease and other diseases of the circulatory system: Secondary | ICD-10-CM | POA: Diagnosis not present

## 2021-09-04 DIAGNOSIS — R059 Cough, unspecified: Secondary | ICD-10-CM | POA: Diagnosis not present

## 2021-09-04 DIAGNOSIS — E785 Hyperlipidemia, unspecified: Secondary | ICD-10-CM | POA: Diagnosis present

## 2021-09-04 DIAGNOSIS — N1831 Chronic kidney disease, stage 3a: Secondary | ICD-10-CM | POA: Diagnosis present

## 2021-09-04 DIAGNOSIS — I4891 Unspecified atrial fibrillation: Secondary | ICD-10-CM | POA: Diagnosis present

## 2021-09-04 DIAGNOSIS — E1165 Type 2 diabetes mellitus with hyperglycemia: Secondary | ICD-10-CM | POA: Diagnosis present

## 2021-09-04 DIAGNOSIS — I739 Peripheral vascular disease, unspecified: Secondary | ICD-10-CM | POA: Diagnosis present

## 2021-09-04 DIAGNOSIS — Z83438 Family history of other disorder of lipoprotein metabolism and other lipidemia: Secondary | ICD-10-CM

## 2021-09-04 DIAGNOSIS — I10 Essential (primary) hypertension: Secondary | ICD-10-CM | POA: Diagnosis present

## 2021-09-04 DIAGNOSIS — Z7901 Long term (current) use of anticoagulants: Secondary | ICD-10-CM

## 2021-09-04 DIAGNOSIS — Z888 Allergy status to other drugs, medicaments and biological substances status: Secondary | ICD-10-CM

## 2021-09-04 DIAGNOSIS — E119 Type 2 diabetes mellitus without complications: Secondary | ICD-10-CM

## 2021-09-04 DIAGNOSIS — I447 Left bundle-branch block, unspecified: Secondary | ICD-10-CM | POA: Diagnosis present

## 2021-09-04 DIAGNOSIS — Z8673 Personal history of transient ischemic attack (TIA), and cerebral infarction without residual deficits: Secondary | ICD-10-CM

## 2021-09-04 DIAGNOSIS — I130 Hypertensive heart and chronic kidney disease with heart failure and stage 1 through stage 4 chronic kidney disease, or unspecified chronic kidney disease: Secondary | ICD-10-CM | POA: Diagnosis present

## 2021-09-04 DIAGNOSIS — I482 Chronic atrial fibrillation, unspecified: Secondary | ICD-10-CM | POA: Diagnosis present

## 2021-09-04 DIAGNOSIS — I159 Secondary hypertension, unspecified: Secondary | ICD-10-CM

## 2021-09-04 DIAGNOSIS — D649 Anemia, unspecified: Secondary | ICD-10-CM | POA: Diagnosis present

## 2021-09-04 DIAGNOSIS — R0602 Shortness of breath: Secondary | ICD-10-CM | POA: Diagnosis not present

## 2021-09-04 DIAGNOSIS — Z8701 Personal history of pneumonia (recurrent): Secondary | ICD-10-CM | POA: Diagnosis not present

## 2021-09-04 DIAGNOSIS — E1122 Type 2 diabetes mellitus with diabetic chronic kidney disease: Secondary | ICD-10-CM | POA: Diagnosis present

## 2021-09-04 DIAGNOSIS — Z66 Do not resuscitate: Secondary | ICD-10-CM | POA: Diagnosis present

## 2021-09-04 DIAGNOSIS — T380X5A Adverse effect of glucocorticoids and synthetic analogues, initial encounter: Secondary | ICD-10-CM | POA: Diagnosis present

## 2021-09-04 DIAGNOSIS — J9621 Acute and chronic respiratory failure with hypoxia: Secondary | ICD-10-CM | POA: Diagnosis present

## 2021-09-04 DIAGNOSIS — N183 Chronic kidney disease, stage 3 unspecified: Secondary | ICD-10-CM | POA: Diagnosis present

## 2021-09-04 DIAGNOSIS — Z20822 Contact with and (suspected) exposure to covid-19: Secondary | ICD-10-CM | POA: Diagnosis present

## 2021-09-04 DIAGNOSIS — J441 Chronic obstructive pulmonary disease with (acute) exacerbation: Secondary | ICD-10-CM | POA: Diagnosis present

## 2021-09-04 DIAGNOSIS — Z7982 Long term (current) use of aspirin: Secondary | ICD-10-CM

## 2021-09-04 DIAGNOSIS — J9601 Acute respiratory failure with hypoxia: Secondary | ICD-10-CM | POA: Diagnosis not present

## 2021-09-04 LAB — BASIC METABOLIC PANEL
Anion gap: 8 (ref 5–15)
BUN: 22 mg/dL (ref 8–23)
CO2: 26 mmol/L (ref 22–32)
Calcium: 9.6 mg/dL (ref 8.9–10.3)
Chloride: 105 mmol/L (ref 98–111)
Creatinine, Ser: 1.45 mg/dL — ABNORMAL HIGH (ref 0.61–1.24)
GFR, Estimated: 47 mL/min — ABNORMAL LOW (ref >60)
Glucose, Bld: 180 mg/dL — ABNORMAL HIGH (ref 70–99)
Potassium: 4.4 mmol/L (ref 3.5–5.1)
Sodium: 139 mmol/L (ref 135–145)

## 2021-09-04 LAB — BRAIN NATRIURETIC PEPTIDE: B Natriuretic Peptide: 1435 pg/mL — ABNORMAL HIGH (ref 0.0–100.0)

## 2021-09-04 LAB — RESP PANEL BY RT-PCR (FLU A&B, COVID) ARPGX2
Influenza A by PCR: NEGATIVE
Influenza B by PCR: NEGATIVE
SARS Coronavirus 2 by RT PCR: NEGATIVE

## 2021-09-04 LAB — BLOOD GAS, VENOUS
Acid-Base Excess: 4.5 mmol/L — ABNORMAL HIGH (ref 0.0–2.0)
Bicarbonate: 25.8 mmol/L (ref 20.0–28.0)
FIO2: 28
O2 Saturation: 16.8 %
Patient temperature: 36.6
pCO2, Ven: 59.7 mmHg (ref 44.0–60.0)
pH, Ven: 7.322 (ref 7.250–7.430)
pO2, Ven: <31 mmHg — CL (ref 32.0–45.0)

## 2021-09-04 LAB — CBC WITH DIFFERENTIAL/PLATELET
Abs Immature Granulocytes: 0.04 10*3/uL (ref 0.00–0.07)
Basophils Absolute: 0 10*3/uL (ref 0.0–0.1)
Basophils Relative: 0 %
Eosinophils Absolute: 0.3 10*3/uL (ref 0.0–0.5)
Eosinophils Relative: 3 %
HCT: 38.8 % — ABNORMAL LOW (ref 39.0–52.0)
Hemoglobin: 11.8 g/dL — ABNORMAL LOW (ref 13.0–17.0)
Immature Granulocytes: 0 %
Lymphocytes Relative: 13 %
Lymphs Abs: 1.2 10*3/uL (ref 0.7–4.0)
MCH: 30.9 pg (ref 26.0–34.0)
MCHC: 30.4 g/dL (ref 30.0–36.0)
MCV: 101.6 fL — ABNORMAL HIGH (ref 80.0–100.0)
Monocytes Absolute: 0.5 10*3/uL (ref 0.1–1.0)
Monocytes Relative: 5 %
Neutro Abs: 7.3 10*3/uL (ref 1.7–7.7)
Neutrophils Relative %: 79 %
Platelets: 155 10*3/uL (ref 150–400)
RBC: 3.82 MIL/uL — ABNORMAL LOW (ref 4.22–5.81)
RDW: 14.6 % (ref 11.5–15.5)
WBC: 9.3 10*3/uL (ref 4.0–10.5)
nRBC: 0 % (ref 0.0–0.2)

## 2021-09-04 LAB — TROPONIN I (HIGH SENSITIVITY)
Troponin I (High Sensitivity): 21 ng/L — ABNORMAL HIGH (ref <18)
Troponin I (High Sensitivity): 22 ng/L — ABNORMAL HIGH (ref <18)

## 2021-09-04 LAB — LIPASE, BLOOD: Lipase: 35 U/L (ref 11–51)

## 2021-09-04 LAB — GLUCOSE, CAPILLARY
Glucose-Capillary: 336 mg/dL — ABNORMAL HIGH (ref 70–99)
Glucose-Capillary: 221 mg/dL — ABNORMAL HIGH (ref 70–99)

## 2021-09-04 LAB — CBG MONITORING, ED: Glucose-Capillary: 298 mg/dL — ABNORMAL HIGH (ref 70–99)

## 2021-09-04 MED ORDER — ONDANSETRON HCL 4 MG PO TABS: 4.0000 mg | ORAL_TABLET | Freq: Four times a day (QID) | ORAL | Status: DC | PRN

## 2021-09-04 MED ORDER — EZETIMIBE 10 MG PO TABS
10.0000 mg | ORAL_TABLET | Freq: Every day | ORAL | Status: DC
  Administered 2021-09-04 – 2021-09-06 (×3): 10 mg via ORAL
  Filled 2021-09-04 (×3): qty 1

## 2021-09-04 MED ORDER — AZITHROMYCIN 250 MG PO TABS
500.0000 mg | ORAL_TABLET | Freq: Every day | ORAL | Status: DC
  Administered 2021-09-04 – 2021-09-06 (×3): 500 mg via ORAL
  Filled 2021-09-04 (×3): qty 2

## 2021-09-04 MED ORDER — ASPIRIN EC 81 MG PO TBEC
81.0000 mg | DELAYED_RELEASE_TABLET | Freq: Every day | ORAL | Status: DC
  Administered 2021-09-04 – 2021-09-06 (×3): 81 mg via ORAL
  Filled 2021-09-04 (×3): qty 1

## 2021-09-04 MED ORDER — ENSURE ENLIVE PO LIQD
237.0000 mL | Freq: Two times a day (BID) | ORAL | Status: DC
  Administered 2021-09-04 – 2021-09-06 (×5): 237 mL via ORAL
  Filled 2021-09-04 (×3): qty 237

## 2021-09-04 MED ORDER — APIXABAN 5 MG PO TABS
5.0000 mg | ORAL_TABLET | Freq: Two times a day (BID) | ORAL | Status: DC
  Administered 2021-09-04 – 2021-09-05 (×3): 5 mg via ORAL
  Filled 2021-09-04 (×3): qty 1

## 2021-09-04 MED ORDER — SODIUM CHLORIDE 0.9% FLUSH
3.0000 mL | Freq: Two times a day (BID) | INTRAVENOUS | Status: DC
  Administered 2021-09-05: 3 mL via INTRAVENOUS

## 2021-09-04 MED ORDER — INSULIN ASPART 100 UNIT/ML IJ SOLN
0.0000 [IU] | Freq: Three times a day (TID) | INTRAMUSCULAR | Status: DC
  Administered 2021-09-04: 3 [IU] via SUBCUTANEOUS
  Administered 2021-09-04: 4 [IU] via SUBCUTANEOUS
  Filled 2021-09-04: qty 1

## 2021-09-04 MED ORDER — INSULIN ASPART 100 UNIT/ML IJ SOLN: 0.0000 [IU] | Freq: Every day | INTRAMUSCULAR | Status: DC

## 2021-09-04 MED ORDER — NITROGLYCERIN 0.4 MG SL SUBL: 0.4000 mg | SUBLINGUAL_TABLET | SUBLINGUAL | Status: DC | PRN

## 2021-09-04 MED ORDER — HYDRALAZINE HCL 25 MG PO TABS
37.5000 mg | ORAL_TABLET | Freq: Three times a day (TID) | ORAL | Status: DC
  Administered 2021-09-04 – 2021-09-06 (×7): 37.5 mg via ORAL
  Filled 2021-09-04 (×7): qty 2

## 2021-09-04 MED ORDER — POLYETHYLENE GLYCOL 3350 17 G PO PACK: 17.0000 g | PACK | Freq: Every day | ORAL | Status: DC | PRN

## 2021-09-04 MED ORDER — SODIUM CHLORIDE 0.9% FLUSH: 3.0000 mL | INTRAVENOUS | Status: DC | PRN

## 2021-09-04 MED ORDER — IPRATROPIUM BROMIDE 0.02 % IN SOLN
0.5000 mg | RESPIRATORY_TRACT | Status: AC
  Administered 2021-09-04: 0.5 mg via RESPIRATORY_TRACT
  Filled 2021-09-04: qty 2.5

## 2021-09-04 MED ORDER — UMECLIDINIUM-VILANTEROL 62.5-25 MCG/INH IN AEPB
1.0000 | INHALATION_SPRAY | Freq: Every day | RESPIRATORY_TRACT | Status: DC
  Administered 2021-09-05 – 2021-09-06 (×2): 1 via RESPIRATORY_TRACT
  Filled 2021-09-04: qty 14

## 2021-09-04 MED ORDER — INSULIN ASPART 100 UNIT/ML IJ SOLN
0.0000 [IU] | Freq: Three times a day (TID) | INTRAMUSCULAR | Status: DC
  Administered 2021-09-05: 5 [IU] via SUBCUTANEOUS
  Administered 2021-09-05: 3 [IU] via SUBCUTANEOUS
  Administered 2021-09-05: 5 [IU] via SUBCUTANEOUS
  Administered 2021-09-06: 3 [IU] via SUBCUTANEOUS

## 2021-09-04 MED ORDER — PREDNISONE 20 MG PO TABS
50.0000 mg | ORAL_TABLET | Freq: Every day | ORAL | Status: DC
  Administered 2021-09-04 – 2021-09-06 (×3): 50 mg via ORAL
  Filled 2021-09-04: qty 1
  Filled 2021-09-04: qty 2
  Filled 2021-09-04: qty 1

## 2021-09-04 MED ORDER — SODIUM CHLORIDE 0.9 % IV SOLN: 250.0000 mL | INTRAVENOUS | Status: DC | PRN

## 2021-09-04 MED ORDER — ALBUTEROL SULFATE (2.5 MG/3ML) 0.083% IN NEBU
2.5000 mg | INHALATION_SOLUTION | Freq: Four times a day (QID) | RESPIRATORY_TRACT | Status: DC | PRN
  Administered 2021-09-05: 2.5 mg via RESPIRATORY_TRACT
  Filled 2021-09-04: qty 3

## 2021-09-04 MED ORDER — ALBUTEROL SULFATE (2.5 MG/3ML) 0.083% IN NEBU
INHALATION_SOLUTION | RESPIRATORY_TRACT | Status: AC
  Administered 2021-09-04: 10 mg
  Filled 2021-09-04: qty 12

## 2021-09-04 MED ORDER — SODIUM CHLORIDE 0.9% FLUSH
3.0000 mL | Freq: Two times a day (BID) | INTRAVENOUS | Status: DC
  Administered 2021-09-04 – 2021-09-06 (×5): 3 mL via INTRAVENOUS

## 2021-09-04 MED ORDER — ONDANSETRON HCL 4 MG/2ML IJ SOLN: 4.0000 mg | Freq: Four times a day (QID) | INTRAMUSCULAR | Status: DC | PRN

## 2021-09-04 MED ORDER — SACUBITRIL-VALSARTAN 49-51 MG PO TABS
1.0000 | ORAL_TABLET | Freq: Two times a day (BID) | ORAL | Status: DC
  Administered 2021-09-04 – 2021-09-06 (×2): 1 via ORAL
  Filled 2021-09-04 (×4): qty 1

## 2021-09-04 MED ORDER — ALBUTEROL (5 MG/ML) CONTINUOUS INHALATION SOLN
10.0000 mg/h | INHALATION_SOLUTION | Freq: Once | RESPIRATORY_TRACT | Status: DC
  Filled 2021-09-04: qty 20

## 2021-09-04 MED ORDER — ACETAMINOPHEN 325 MG PO TABS: 650.0000 mg | ORAL_TABLET | Freq: Four times a day (QID) | ORAL | Status: DC | PRN

## 2021-09-04 MED ORDER — ACETAMINOPHEN 650 MG RE SUPP: 650.0000 mg | Freq: Four times a day (QID) | RECTAL | Status: DC | PRN

## 2021-09-04 MED ORDER — TRAZODONE HCL 50 MG PO TABS: 50.0000 mg | ORAL_TABLET | Freq: Every evening | ORAL | Status: DC | PRN

## 2021-09-04 MED ORDER — FUROSEMIDE 10 MG/ML IJ SOLN
40.0000 mg | Freq: Two times a day (BID) | INTRAMUSCULAR | Status: DC
  Administered 2021-09-04 – 2021-09-06 (×5): 40 mg via INTRAVENOUS
  Filled 2021-09-04 (×5): qty 4

## 2021-09-04 MED ORDER — INSULIN ASPART 100 UNIT/ML IJ SOLN
0.0000 [IU] | Freq: Every day | INTRAMUSCULAR | Status: DC
  Administered 2021-09-04: 2 [IU] via SUBCUTANEOUS

## 2021-09-04 MED ORDER — METHYLPREDNISOLONE SODIUM SUCC 125 MG IJ SOLR
125.0000 mg | Freq: Once | INTRAMUSCULAR | Status: AC
  Administered 2021-09-04: 125 mg via INTRAVENOUS
  Filled 2021-09-04: qty 2

## 2021-09-04 MED ORDER — BISACODYL 10 MG RE SUPP: 10.0000 mg | Freq: Every day | RECTAL | Status: DC | PRN

## 2021-09-04 MED ORDER — CARVEDILOL 3.125 MG PO TABS
6.2500 mg | ORAL_TABLET | Freq: Two times a day (BID) | ORAL | Status: DC
  Administered 2021-09-04 – 2021-09-06 (×5): 6.25 mg via ORAL
  Filled 2021-09-04 (×5): qty 2

## 2021-09-04 NOTE — ED Notes (Signed)
ED TO INPATIENT HANDOFF REPORT  ED Nurse Name and Phone #:   S Name/Age/Gender Gary Odonnell 87 y.o. male Room/Bed: APA10/APA10  Code Status   Code Status: DNR  Home/SNF/Other Home Patient oriented to: self, place, time, and situation Is this baseline? Yes   Triage Complete: Triage complete  Chief Complaint Acute respiratory failure with hypoxia ([place]) [J96.01]  Triage Note Pt c/o sob that started Monday. Pt states he has oxygen at home as needed.    Allergies Allergies  Allergen Reactions   Statins Other (See Comments)    Intolerance per outpt Cardiology notes   Aldactone [Spironolactone] Diarrhea    Level of Care/Admitting Diagnosis ED Disposition     ED Disposition  Admit   Condition  --   [place]: [facility] [[id number]]  Level of Care: Telemetry [5]  Covid Evaluation: Confirmed COVID Negative  Diagnosis: Acute respiratory failure with hypoxia [facility]) [[id number]]  Admitting Physician: Bernadette Hoit [[id number]]  Attending Physician: Bernadette Hoit [[id number]]  Estimated length of stay: past midnight tomorrow  Certification:: I certify this patient will need inpatient services for at least 2 midnights          B Medical/Surgery History Past Medical History:  Diagnosis Date   Atrial fibrillation ([place])    CHF (congestive heart failure) ([place])    a. EF 40-45% in 2015, at EF 30-35% by echo in 12/2018   COPD (chronic obstructive pulmonary disease) ([place])    Coronary artery disease    a. s/p CABG with most recent cath in 2014 showing patent LIMA-LAD, occluded Radial to OM1-OM2-PDA and occluded RCA with L--> R collaterals   Diabetic nephropathy ([place])    Essential hypertension    Hyperlipidemia    PAD (peripheral artery disease) ([place])    Pneumonia 09/2013   Primary cancer of left lower lobe of lung ([place]) 02/02/2015   Radiation 02/2015   Spinal stenosis    Stroke ([place]) 11/2008   Type 2 diabetes mellitus ([place])    Past Surgical History:   Procedure Laterality Date   Angioplasty to Left femoral artery     CARPAL TUNNEL RELEASE Left 05/11/2014   Procedure: LEFT CARPAL TUNNEL RELEASE;  Surgeon: Carole Civil, MD;  Location: [facility];  Service: Orthopedics;  Laterality: Left;   COLONOSCOPY N/A 01/24/2015   Procedure: COLONOSCOPY;  Surgeon: Danie Binder, MD;  Location: [facility];  Service: Endoscopy;  Laterality: N/A;  130   CORONARY ARTERY BYPASS GRAFT     x4   FEMORAL-POPLITEAL BYPASS GRAFT Right 10/23/2015   Procedure: BYPASS GRAFT FEMORAL TO BELOW KNEE POPLITEAL ARTERY USING RIGHT NON-REVERSED GREATER SAPPHENOUS VEIN;  Surgeon: [name], MD;  Location: [place];  Service: Vascular;  Laterality: Right;   INTRAOPERATIVE ARTERIOGRAM Right 10/23/2015   Procedure: INTRA OPERATIVE ARTERIOGRAM - RIGHT LOWER LEG;  Surgeon: [name], MD;  Location: [place];  Service: Vascular;  Laterality: Right;   KNEE ARTHROSCOPY Right    LEFT AND RIGHT HEART CATHETERIZATION WITH CORONARY ANGIOGRAM N/A 09/29/2013   Procedure: LEFT AND RIGHT HEART CATHETERIZATION WITH CORONARY ANGIOGRAM;  Surgeon: Burnell Blanks, MD;  Location[facility] CATH LAB;  Service: Cardiovascular;  Laterality: N/A;   LEFT HEART CATH AND CORS/GRAFTS ANGIOGRAPHY N/A 04/11/2020   Procedure: LEFT HEART CATH AND CORS/GRAFTS ANGIOGRAPHY;  Surgeon: Burnell Blanks, MD;  Location: [place] CV LAB;  Service: Cardiovascular;  Laterality: N/A;   PERIPHERAL VASCULAR CATHETERIZATION N/A 04/21/2015   Procedure: Abdominal Aortogram w/Lower Extremity;  Surgeon: [name], MD;  Location: [place] CV LAB;  Service: Cardiovascular;  Laterality: N/A;   Stent to right femoral artery     TRIGGER FINGER RELEASE Bilateral    VEIN HARVEST Right 10/23/2015   Procedure: [place];  Surgeon: [name], MD;  Location: [place];  Service: Vascular;  Laterality: Right;     A IV Location/Drains/Wounds Patient Lines/Drains/Airways  Status     Active Line/Drains/Airways     Name Placement date Placement time Site Days   Peripheral IV 09/04/21 20 G Anterior;Right Forearm 09/04/21  0041  Forearm  less than 1   Pressure Injury 04/08/20 Coccyx Stage 1 -  Intact skin with non-blanchable redness of a localized area usually over a bony prominence. 04/08/20  2015  -- 514            Intake/Output Last 24 hours  Intake/Output Summary (Last 24 hours) at 09/04/2021 1614 Last data filed at 09/04/2021 1015 Gross per 24 hour  Intake 3 ml  Output 400 ml  Net -397 ml    Labs/Imaging Results for orders placed or performed during the hospital encounter of 09/04/21 (from the past 48 hour(s))  CBC with Differential/Platelet     Status: Abnormal   Collection Time: 09/04/21  1:00 AM  Result Value Ref Range   WBC 9.3 4.0 - 10.5 K/uL   RBC 3.82 (L) 4.22 - 5.81 MIL/uL   Hemoglobin 11.8 (L) 13.0 - 17.0 g/dL   HCT 38.8 (L) 39.0 - 52.0 %   MCV 101.6 (H) 80.0 - 100.0 fL   MCH 30.9 26.0 - 34.0 pg   MCHC 30.4 30.0 - 36.0 g/dL   RDW 14.6 11.5 - 15.5 %   Platelets 155 150 - 400 K/uL   nRBC 0.0 0.0 - 0.2 %   Neutrophils Relative % 79 %   Neutro Abs 7.3 1.7 - 7.7 K/uL   Lymphocytes Relative 13 %   Lymphs Abs 1.2 0.7 - 4.0 K/uL   Monocytes Relative 5 %   Monocytes Absolute 0.5 0.1 - 1.0 K/uL   Eosinophils Relative 3 %   Eosinophils Absolute 0.3 0.0 - 0.5 K/uL   Basophils Relative 0 %   Basophils Absolute 0.0 0.0 - 0.1 K/uL   Immature Granulocytes 0 %   Abs Immature Granulocytes 0.04 0.00 - 0.07 K/uL    Comment: Performed at [facility], [address] [place], [place] [address]  Basic metabolic panel     Status: Abnormal   Collection Time: 09/04/21  1:00 AM  Result Value Ref Range   Sodium 139 135 - 145 mmol/L   Potassium 4.4 3.5 - 5.1 mmol/L   Chloride 105 98 - 111 mmol/L   CO2 26 22 - 32 mmol/L   Glucose, Bld 180 (H) 70 - 99 mg/dL    Comment: Glucose reference range applies only to samples taken after fasting for at  least 8 hours.   BUN 22 8 - 23 mg/dL   Creatinine, Ser 1.45 (H) 0.61 - 1.24 mg/dL   Calcium 9.6 8.9 - 10.3 mg/dL   GFR, Estimated 47 (L) >60 mL/min    Comment: (NOTE) Calculated using the CKD-EPI Creatinine Equation (2021)    Anion gap 8 5 - 15    Comment: Performed at [facility], [address] [place], [place] [address]  Troponin I (High Sensitivity)     Status: Abnormal   Collection Time: 09/04/21  1:00 AM  Result Value Ref Range  Troponin I (High Sensitivity) 21 (H) <18 ng/L    Comment: (NOTE) Elevated high sensitivity troponin I (hsTnI) values and significant  changes across serial measurements may suggest ACS but many other  chronic and acute conditions are known to elevate hsTnI results.  Refer to the "Links" section for chest pain algorithms and additional  guidance. Performed at [facility], [address], [place], [place] [address]   Brain natriuretic peptide     Status: Abnormal   Collection Time: 09/04/21  1:00 AM  Result Value Ref Range   B Natriuretic Peptide 1,435.0 (H) 0.0 - 100.0 pg/mL    Comment: Performed at [facility], [address], [place], [place] [address]  Lipase, blood     Status: None   Collection Time: 09/04/21  1:00 AM  Result Value Ref Range   Lipase 35 11 - 51 U/L    Comment: Performed at [facility], [address] [place], [place] [address]  Blood gas, venous     Status: Abnormal   Collection Time: 09/04/21  1:10 AM  Result Value Ref Range   FIO2 28.00    pH, Ven 7.322 7.250 - 7.430   pCO2, Ven 59.7 44.0 - 60.0 mmHg   pO2, Ven <31.0 (LL) 32.0 - 45.0 mmHg    Comment: CRITICAL RESULT CALLED TO, READ BACK BY AND VERIFIED WITH: BLACK,C@[id number]  BY MATTHEWS, B 10.11.22    Bicarbonate 25.8 20.0 - 28.0 mmol/L   Acid-Base Excess 4.5 (H) 0.0 - 2.0 mmol/L   O2 Saturation 16.8 %   Patient temperature 36.6     Comment: Performed at [facility], [address], [place], [place] [address]  Resp Panel by RT-PCR (Flu A&B, Covid) Nasopharyngeal Swab      Status: None   Collection Time: 09/04/21  2:38 AM   Specimen: Nasopharyngeal Swab; Nasopharyngeal(NP) swabs in vial transport medium  Result Value Ref Range   SARS Coronavirus 2 by RT PCR NEGATIVE NEGATIVE    Comment: (NOTE) SARS-CoV-2 target nucleic acids are NOT DETECTED.  The SARS-CoV-2 RNA is generally detectable in upper respiratory specimens during the acute phase of infection. The lowest concentration of SARS-CoV-2 viral copies this assay can detect is 138 copies/mL. A negative result does not preclude SARS-Cov-2 infection and should not be used as the sole basis for treatment or other patient management decisions. A negative result may occur with  improper specimen collection/handling, submission of specimen other than nasopharyngeal swab, presence of viral mutation(s) within the areas targeted by this assay, and inadequate number of viral copies(<138 copies/mL). A negative result must be combined with clinical observations, patient history, and epidemiological information. The expected result is Negative.  Fact Sheet for Patients:  EntrepreneurPulse.com.au  Fact Sheet for Healthcare Providers:  IncredibleEmployment.be  This test is no t yet approved or cleared by the [place] FDA and  has been authorized for detection and/or diagnosis of SARS-CoV-2 by FDA under an Emergency Use Authorization (EUA). This EUA will remain  in effect (meaning this test can be used) for the duration of the COVID-19 declaration under Section 564(b)(1) of the Act, 21 U.S.C.section 360bbb-3(b)(1), unless the authorization is terminated  or revoked sooner.       Influenza A by PCR NEGATIVE NEGATIVE   Influenza B by PCR NEGATIVE NEGATIVE    Comment: (NOTE) The Xpert Xpress SARS-CoV-2/FLU/RSV plus assay is intended as an aid in the diagnosis of influenza from Nasopharyngeal swab specimens and should not be used as a sole basis for treatment. Nasal  washings and aspirates are unacceptable for Xpert Xpress SARS-CoV-2/FLU/RSV testing.  Fact Sheet for Patients: EntrepreneurPulse.com.au  Fact Sheet for Healthcare Providers: IncredibleEmployment.be  This test is not yet approved or cleared by the [place] FDA and has been authorized for detection and/or diagnosis of SARS-CoV-2 by FDA under an Emergency Use Authorization (EUA). This EUA will remain in effect (meaning this test can be used) for the duration of the COVID-19 declaration under Section 564(b)(1) of the Act, 21 U.S.C. section 360bbb-3(b)(1), unless the authorization is terminated or revoked.  Performed at [facility], [address], [place], [place] [address]   Troponin I (High Sensitivity)     Status: Abnormal   Collection Time: 09/04/21  2:50 AM  Result Value Ref Range   Troponin I (High Sensitivity) 22 (H) <18 ng/L    Comment: (NOTE) Elevated high sensitivity troponin I (hsTnI) values and significant  changes across serial measurements may suggest ACS but many other  chronic and acute conditions are known to elevate hsTnI results.  Refer to the "Links" section for chest pain algorithms and additional  guidance. Performed at [facility], [address], [place], [place] [address]   CBG monitoring, ED     Status: Abnormal   Collection Time: 09/04/21 12:36 PM  Result Value Ref Range   Glucose-Capillary 298 (H) 70 - 99 mg/dL    Comment: Glucose reference range applies only to samples taken after fasting for at least 8 hours.   DG Chest Port 1 View  Result Date: 09/04/2021 CLINICAL DATA:  Shortness of breath and cough starting Monday. EXAM: PORTABLE CHEST 1 VIEW COMPARISON:  10/06/2020 FINDINGS: Postoperative changes in the mediastinum. Cardiac enlargement. No vascular congestion, edema, or consolidation. No pleural effusions. No pneumothorax. Mediastinal contours appear intact. IMPRESSION: Cardiac enlargement.  No evidence of  active pulmonary disease. Electronically Signed   By: Lucienne Capers M.D.   On: 09/04/2021 01:23    Pending Labs Unresulted Labs (From admission, onward)     Start     Ordered   09/05/21 0981  Basic metabolic panel  Tomorrow morning,   R        09/04/21 0802   09/05/21 0500  CBC  Tomorrow morning,   R        09/04/21 0802            Vitals/Pain Today's Vitals   09/04/21 1100 09/04/21 1130 09/04/21 1230 09/04/21 1240  BP: (!) 134/55 (!) 158/51 (!) 149/69   Pulse:  66 68   Resp: 14 15 15    Temp:      TempSrc:      SpO2:  100% 100%   Weight:      Height:      PainSc:    0-No pain    Isolation Precautions No active isolations  Medications Medications  albuterol (PROVENTIL,VENTOLIN) solution continuous neb ( Nebulization Canceled Entry 09/04/21 0418)  aspirin EC tablet 81 mg (81 mg Oral Given 09/04/21 1012)  carvedilol (COREG) tablet 6.25 mg (6.25 mg Oral Given 09/04/21 0851)  sacubitril-valsartan (ENTRESTO) 49-51 mg per tablet (1 tablet Oral Given 09/04/21 1010)  ezetimibe (ZETIA) tablet 10 mg (10 mg Oral Given 09/04/21 1012)  hydrALAZINE (APRESOLINE) tablet 37.5 mg (37.5 mg Oral Given 09/04/21 1010)  nitroGLYCERIN (NITROSTAT) SL tablet 0.4 mg (has no administration in time range)  apixaban (ELIQUIS) tablet 5 mg (5 mg Oral Given 09/04/21 1012)  albuterol (PROVENTIL) (2.5 MG/3ML) 0.083% nebulizer solution 2.5 mg (has no administration in time range)  sodium  chloride flush (NS) 0.9 % injection 3 mL (3 mLs Intravenous Not Given 09/04/21 1013)  sodium chloride flush (NS) 0.9 % injection 3 mL (3 mLs Intravenous Given 09/04/21 1013)  sodium chloride flush (NS) 0.9 % injection 3 mL (has no administration in time range)  0.9 %  sodium chloride infusion (has no administration in time range)  acetaminophen (TYLENOL) tablet 650 mg (has no administration in time range)    Or  acetaminophen (TYLENOL) suppository 650 mg (has no administration in time range)  traZODone (DESYREL)  tablet 50 mg (has no administration in time range)  polyethylene glycol (MIRALAX / GLYCOLAX) packet 17 g (has no administration in time range)  bisacodyl (DULCOLAX) suppository 10 mg (has no administration in time range)  ondansetron (ZOFRAN) tablet 4 mg (has no administration in time range)    Or  ondansetron (ZOFRAN) injection 4 mg (has no administration in time range)  furosemide (LASIX) injection 40 mg (40 mg Intravenous Given 09/04/21 0851)  insulin aspart (novoLOG) injection 0-6 Units (3 Units Subcutaneous Given 09/04/21 1256)  insulin aspart (novoLOG) injection 0-5 Units (has no administration in time range)  feeding supplement (ENSURE ENLIVE / ENSURE PLUS) liquid 237 mL (237 mLs Oral Given 09/04/21 1516)  methylPREDNISolone sodium succinate (SOLU-MEDROL) 125 mg/2 mL injection 125 mg (125 mg Intravenous Given 09/04/21 0409)  ipratropium (ATROVENT) nebulizer solution 0.5 mg (0.5 mg Nebulization Given 09/04/21 0417)  albuterol (PROVENTIL) (2.5 MG/3ML) 0.083% nebulizer solution (10 mg  Given 09/04/21 0417)    Mobility walks with device High fall risk   Focused Assessments     R Recommendations: See Admitting Provider Note  Report given to:   Additional Notes:

## 2021-09-04 NOTE — ED Triage Notes (Signed)
Pt c/o sob that started Monday. Pt states he has oxygen at home as needed.

## 2021-09-04 NOTE — H&P (Signed)
Patient Demographics:    Gary Odonnell, is a 87 y.o. male  MRN: [id number]   DOB - [birth year]  Admit Date - 09/04/2021  Outpatient Primary MD for the patient is Loman Brooklyn, FNP   Assessment & Plan:    Principal Problem:   Acute respiratory failure with hypoxia (HCC)    A/p 1) acute on chronic hypoxic respiratory failure due to combination of COPD and CHF -Continue supplemental oxygen,  management as below #213  2) acute COPD exacerbation--- history of prior left lung cancer continue steroids, bronchodilators and mucolytics with azithromycin --Chest x-ray without acute cardiopulmonary findings, likely not pneumonia  3)HFrEF--acute on chronic systolic dysfunction CHF exacerbation, ?  Due to recent adjustment in patient's diuretic dose --BNP is elevated at 1435 -Continue Entresto, Coreg, hydralazine -EF in the 40 to 45% range based on echo from 10/14/2020 -Gentle diuresis with IV Lasix, daily weight and fluid input and output monitoring  4)CAD status post prior CABG in 2014--- no ACS type symptoms -Troponins are flat 21>>22 -EKG with A. fib and LBBB--not new  5)PAD/H/o CVA--continue aspirin and Zetia (statin intolerance)  6) chronic atrial fibrillation--Coreg for rate control and apixaban for stroke prophylaxis  7)DM2-recent A1c 6.3 reflecting good diabetic control PTA -Hold Farxiga -Anticipate steroid-induced hyperglycemia Use Novolog/Humalog Sliding scale insulin with Accu-Cheks/Fingersticks as ordered   8)HTN--stable, cardiac/BP meds as above #3  9)  CKD stage - 3A -Renal function is currently close to baseline, monitor renal function closely with diuresis , renally adjust medications, avoid nephrotoxic agents / dehydration  / hypotension  10)Social/Ethics--patient request DNR status with  full scope of treatment  Disposition/Need for in-Hospital Stay- patient unable to be discharged at this time due to --- COPD and CHF exacerbation leading to acute on chronic hypoxic respiratory failure-*  Status is: Inpatient  Remains inpatient appropriate because: Please see disposition above  Dispo: The patient is from: Home              Anticipated d/c is to: Home              Anticipated d/c date is: 2 days              Patient currently is not medically stable to d/c. Barriers: Not Clinically Stable-    With History of - Reviewed by me  Past Medical History:  Diagnosis Date   Atrial fibrillation (HCC)    CHF (congestive heart failure) ([place])    a. EF 40-45% in 2015, at EF 30-35% by echo in 12/2018   COPD (chronic obstructive pulmonary disease) ([place])    Coronary artery disease    a. s/p CABG with most recent cath in 2014 showing patent LIMA-LAD, occluded Radial to OM1-OM2-PDA and occluded RCA with L--> R collaterals   Diabetic nephropathy ([place])    Essential hypertension    Hyperlipidemia    PAD (peripheral artery disease) ([place])    Pneumonia 09/2013   Primary cancer of  left lower lobe of lung ([place]) 02/02/2015   Radiation 02/2015   Spinal stenosis    Stroke [facility]) 11/2008   Type 2 diabetes mellitus ([place])       Past Surgical History:  Procedure Laterality Date   Angioplasty to Left femoral artery     CARPAL TUNNEL RELEASE Left 05/11/2014   Procedure: LEFT CARPAL TUNNEL RELEASE;  Surgeon: Carole Civil, MD;  Location: [facility];  Service: Orthopedics;  Laterality: Left;   COLONOSCOPY N/A 01/24/2015   Procedure: COLONOSCOPY;  Surgeon: Danie Binder, MD;  Location: [facility];  Service: Endoscopy;  Laterality: N/A;  130   CORONARY ARTERY BYPASS GRAFT     x4   FEMORAL-POPLITEAL BYPASS GRAFT Right 10/23/2015   Procedure: BYPASS GRAFT FEMORAL TO BELOW KNEE POPLITEAL ARTERY USING RIGHT NON-REVERSED GREATER SAPPHENOUS VEIN;  Surgeon: [name], MD;  Location: [place];   Service: Vascular;  Laterality: Right;   INTRAOPERATIVE ARTERIOGRAM Right 10/23/2015   Procedure: INTRA OPERATIVE ARTERIOGRAM - RIGHT LOWER LEG;  Surgeon: [name], MD;  Location: [place];  Service: Vascular;  Laterality: Right;   KNEE ARTHROSCOPY Right    LEFT AND RIGHT HEART CATHETERIZATION WITH CORONARY ANGIOGRAM N/A 09/29/2013   Procedure: LEFT AND RIGHT HEART CATHETERIZATION WITH CORONARY ANGIOGRAM;  Surgeon: Burnell Blanks, MD;  Location: [facility] CATH LAB;  Service: Cardiovascular;  Laterality: N/A;   LEFT HEART CATH AND CORS/GRAFTS ANGIOGRAPHY N/A 04/11/2020   Procedure: LEFT HEART CATH AND CORS/GRAFTS ANGIOGRAPHY;  Surgeon: Burnell Blanks, MD;  Location: [place] CV LAB;  Service: Cardiovascular;  Laterality: N/A;   PERIPHERAL VASCULAR CATHETERIZATION N/A 04/21/2015   Procedure: Abdominal Aortogram w/Lower Extremity;  Surgeon: [name], MD;  Location: [place] CV LAB;  Service: Cardiovascular;  Laterality: N/A;   Stent to right femoral artery     TRIGGER FINGER RELEASE Bilateral    VEIN HARVEST Right 10/23/2015   Procedure: [place];  Surgeon: [name], MD;  Location: [place];  Service: Vascular;  Laterality: Right;      Chief Complaint  Patient presents with   Shortness of Breath      HPI:    Gary Odonnell  is a 87 y.o. male with past medical history relevant for COPD, history of chronic hypoxic respiratory failure uses oxygen at home as needed, CAD status post prior CABG in 6812, systolic dysfunction CHF with EF in the 40 to 45% range based on echo from 10/14/2020, chronic atrial fibrillation, HTN, DM 2, PAD, history of prior stroke and history of prior left  lung cancer presents to the ED with increasing shortness of breath increasing dyspnea and hypoxia having to use oxygen more - Patient recently saw his cardiologist about 8 days ago and diuretics with reduced as he was doing better at the time -He has cough, no  fevers no chills -No chest pains no vomiting or diarrhea -Chest x-ray without acute cardiopulmonary findings -BNP is elevated at 1435 -Troponins are flat 21>>22 -WBCs 9.3 with hemoglobin of 11.8 platelets 155 -Creatinine is 1.45 -COVID-negative - Patient was given steroids, bronchodilators and IV Lasix   Review of systems:    In addition to the HPI above,   A full Review of  Systems was done, all other systems reviewed are negative except as noted above in HPI , .    Social History:  Reviewed by me    Social History   Tobacco Use   Smoking status: Every  Day    Packs/day: 0.50    Years: 60.00    Pack years: 30.00    Types: Cigars, Cigarettes    Start date: 11/21/1949   Smokeless tobacco: Never   Tobacco comments:    1-2 cigars per day   Substance Use Topics   Alcohol use: No    Alcohol/week: 0.0 standard drinks     Family History :  Reviewed by me    Family History  Problem Relation Age of Onset   Heart attack Father    Hyperlipidemia Father    Diabetes Mother    Diabetes Brother    Heart Problems Brother    Colon cancer Neg Hx     Home Medications:   Prior to Admission medications   Medication Sig Start Date End Date Taking? Authorizing Provider  albuterol (PROVENTIL) (2.5 MG/3ML) 0.083% nebulizer solution Take 3 mLs (2.5 mg total) by nebulization every 6 (six) hours as needed for shortness of breath. 10/08/20  Yes Kathie Dike, MD  apixaban (ELIQUIS) 5 MG TABS tablet Take 1 tablet (5 mg total) by mouth 2 (two) times daily. 05/31/21  Yes BranchAlphonse Guild, MD  aspirin 81 MG EC tablet Take 1 tablet (81 mg total) by mouth daily. 04/12/20  Yes Debbe Odea, MD  carvedilol (COREG) 6.25 MG tablet Take 1 tablet (6.25 mg total) by mouth 2 (two) times daily with a meal. 05/31/21  Yes Branch, Alphonse Guild, MD  ENTRESTO 49-51 MG TAKE 1 TABLET TWICE A DAY 06/07/21  Yes Hendricks Limes F, FNP  ezetimibe (ZETIA) 10 MG tablet Take 1 tablet (10 mg total) by mouth daily.  05/31/21  Yes Branch, Alphonse Guild, MD  furosemide (LASIX) 40 MG tablet Take 0.5 tablets (20 mg total) by mouth daily. (MAY TAKE AN ADDITIONAL 1/2 TAB AS NEEDED FOR SWELLING) - dose decreased 08/27/2021 08/27/21  Yes Branch, Alphonse Guild, MD  hydrALAZINE (APRESOLINE) 25 MG tablet Take 1.5 tablets (37.5 mg total) by mouth 3 (three) times daily. 09/03/21 10/03/21 Yes Branch, Alphonse Guild, MD  nitroGLYCERIN (NITROSTAT) 0.3 MG SL tablet Place 0.3 mg under the tongue every 5 (five) minutes as needed for chest pain.   Yes [provider]  Potassium 99 MG TABS Take 1 tablet by mouth daily.    Yes [provider]  albuterol (VENTOLIN HFA) 108 (90 Base) MCG/ACT inhaler Inhale 2 puffs into the lungs every 6 (six) hours as needed for wheezing or shortness of breath. Patient not taking: Reported on 09/04/2021 04/11/20   Debbe Odea, MD  dapagliflozin propanediol (FARXIGA) 10 MG TABS tablet Take 1 tablet (10 mg total) by mouth daily before breakfast. Patient not taking: Reported on 09/04/2021 09/03/21   Arnoldo Lenis, MD     Allergies:     Allergies  Allergen Reactions   Statins Other (See Comments)    Intolerance per outpt Cardiology notes   Aldactone [Spironolactone] Diarrhea     Physical Exam:   Vitals  Blood pressure (!) 146/64, pulse 64, temperature 98 F (36.7 C), temperature source Oral, resp. rate 15, height 5\' 11"  (1.803 m), weight 79.1 kg, SpO2 100 %.  Physical Examination: General appearance - alert, and in no distress  Mental status - alert, oriented to person, place, and time,  Eyes - sclera anicteric Neck - supple, no JVD elevation , Chest -air movement is fair, no wheeze  heart - S1 and S2 normal, irregularly irregular Abdomen - soft, nontender, nondistended, no masses or organomegaly Neurological - screening mental status exam  normal, neck supple without rigidity, cranial nerves II through XII intact, DTR's normal and symmetric Extremities - no significant pedal  edema noted, intact peripheral pulses  Skin - warm, dry     Data Review:    CBC Recent Labs  Lab 08/31/21 0924 09/04/21 0100  WBC 7.3 9.3  HGB 11.6* 11.8*  HCT 38.3* 38.8*  PLT 157 155  MCV 101.3* 101.6*  MCH 30.7 30.9  MCHC 30.3 30.4  RDW 14.6 14.6  LYMPHSABS  --  1.2  MONOABS  --  0.5  EOSABS  --  0.3  BASOSABS  --  0.0   ------------------------------------------------------------------------------------------------------------------  Chemistries  Recent Labs  Lab 08/31/21 0924 09/04/21 0100  NA 144 139  K 4.9 4.4  CL 108 105  CO2 29 26  GLUCOSE 140* 180*  BUN 18 22  CREATININE 1.33* 1.45*  CALCIUM 9.4 9.6  MG 2.3  --    ------------------------------------------------------------------------------------------------------------------ estimated creatinine clearance is 38.9 mL/min (A) (by C-G formula based on SCr of 1.45 mg/dL (H)). ------------------------------------------------------------------------------------------------------------------ No results for input(s): TSH, T4TOTAL, T3FREE, THYROIDAB in the last 72 hours.  Invalid input(s): FREET3   Coagulation profile No results for input(s): INR, PROTIME in the last 168 hours. ------------------------------------------------------------------------------------------------------------------- No results for input(s): DDIMER in the last 72 hours. -------------------------------------------------------------------------------------------------------------------  Cardiac Enzymes No results for input(s): CKMB, TROPONINI, MYOGLOBIN in the last 168 hours.  Invalid input(s): CK ------------------------------------------------------------------------------------------------------------------    Component Value Date/Time   BNP 1,435.0 (H) 09/04/2021 0100   Urinalysis    Component Value Date/Time   COLORURINE YELLOW 10/06/2020 1027   APPEARANCEUR CLEAR 10/06/2020 1027   LABSPEC 1.019 10/06/2020 1027    PHURINE 6.0 10/06/2020 1027   GLUCOSEU 50 (A) 10/06/2020 1027   HGBUR SMALL (A) 10/06/2020 1027   BILIRUBINUR NEGATIVE 10/06/2020 1027   KETONESUR NEGATIVE 10/06/2020 1027   PROTEINUR 100 (A) 10/06/2020 1027   NITRITE NEGATIVE 10/06/2020 1027   LEUKOCYTESUR MODERATE (A) 10/06/2020 1027    Imaging Results:    DG Chest Port 1 View  Result Date: 09/04/2021 CLINICAL DATA:  Shortness of breath and cough starting Monday. EXAM: PORTABLE CHEST 1 VIEW COMPARISON:  10/06/2020 FINDINGS: Postoperative changes in the mediastinum. Cardiac enlargement. No vascular congestion, edema, or consolidation. No pleural effusions. No pneumothorax. Mediastinal contours appear intact. IMPRESSION: Cardiac enlargement.  No evidence of active pulmonary disease. Electronically Signed   By: Lucienne Capers M.D.   On: 09/04/2021 01:23    Radiological Exams on Admission: DG Chest Port 1 View  Result Date: 09/04/2021 CLINICAL DATA:  Shortness of breath and cough starting Monday. EXAM: PORTABLE CHEST 1 VIEW COMPARISON:  10/06/2020 FINDINGS: Postoperative changes in the mediastinum. Cardiac enlargement. No vascular congestion, edema, or consolidation. No pleural effusions. No pneumothorax. Mediastinal contours appear intact. IMPRESSION: Cardiac enlargement.  No evidence of active pulmonary disease. Electronically Signed   By: Lucienne Capers M.D.   On: 09/04/2021 01:23    DVT Prophylaxis -SCD /eliquis AM Labs Ordered, also please review Full Orders  Family Communication: Admission, patients condition and plan of care including tests being ordered have been discussed with the patient who indicate understanding and agree with the plan   Code Status - DNR  Likely DC to  home  Condition   stable  Roxan Hockey M.D on 09/04/2021 at 5:54 PM Go to [email or web address] -  for contact info  [facility] - Office  [phone]

## 2021-09-04 NOTE — ED Provider Notes (Signed)
[facility] EMERGENCY DEPARTMENT Provider Note   CSN: [id number] Arrival date & time: 09/04/21  0001     History Chief Complaint  Patient presents with   Shortness of Breath    Gary Odonnell is a 87 y.o. male.  Patient presents to the emergency department for evaluation of shortness of breath.  Patient reports a history of congestive heart failure and COPD.  Patient reports that he awakened this morning with shortness of breath.  He has been severely short of breath throughout the day.  Patient reports that he has been sitting in a chair most of the day because simply walking to the bathroom causes him to be very out of breath.  He has not had any associated chest pain.  He has not noticed any swelling of his legs or fluid retention.      Past Medical History:  Diagnosis Date   Atrial fibrillation (HCC)    CHF (congestive heart failure) ([place])    a. EF 40-45% in 2015, at EF 30-35% by echo in 12/2018   COPD (chronic obstructive pulmonary disease) ([place])    Coronary artery disease    a. s/p CABG with most recent cath in 2014 showing patent LIMA-LAD, occluded Radial to OM1-OM2-PDA and occluded RCA with L--> R collaterals   Diabetic nephropathy (HCC)    Essential hypertension    Hyperlipidemia    PAD (peripheral artery disease) ([place])    Pneumonia 09/2013   Primary cancer of left lower lobe of lung ([place]) 02/02/2015   Radiation 02/2015   Spinal stenosis    Stroke ([place]) 11/2008   Type 2 diabetes mellitus [facility])     Patient Active Problem List   Diagnosis Date Noted   Acute on chronic combined systolic and diastolic CHF (congestive heart failure) ([place]) 10/06/2020   DM type 2 (diabetes mellitus, type 2) ([place]) 10/06/2020   CKD (chronic kidney disease) stage 3, GFR 30-59 ml/min (HCC) 10/06/2020   Abnormal TSH 09/10/2020   Diabetic nephropathy ([place])    History of lung cancer 06/09/2020   Stage 3b chronic kidney disease ([place]) 06/08/2020   History of non-ST elevation myocardial  infarction (NSTEMI) 04/08/2020   Diet-controlled diabetes mellitus ([place]) 01/10/2019   Chronic obstructive pulmonary disease ([place]) 01/05/2019   A-fib ([place]) 01/05/2019   Tobacco abuse    Left shoulder pain 09/01/2018   Iliac artery stenosis, left ([place]) 04/22/2015   PAD (peripheral artery disease) ([place]) 04/21/2015   Carpal tunnel syndrome, left 05/03/2014   HTN (hypertension) 09/26/2013   Chronic combined systolic and diastolic CHF (congestive heart failure) ([place]) 09/26/2013   History of CVA (cerebrovascular accident) 09/26/2013   CAD (coronary atherosclerotic disease) 09/26/2013   S/P CABG x 4 09/26/2013    Past Surgical History:  Procedure Laterality Date   Angioplasty to Left femoral artery     CARPAL TUNNEL RELEASE Left 05/11/2014   Procedure: LEFT CARPAL TUNNEL RELEASE;  Surgeon: Carole Civil, MD;  Location: [facility] ORS;  Service: Orthopedics;  Laterality: Left;   COLONOSCOPY N/A 01/24/2015   Procedure: COLONOSCOPY;  Surgeon: Danie Binder, MD;  Location: [facility] ENDO SUITE;  Service: Endoscopy;  Laterality: N/A;  130   CORONARY ARTERY BYPASS GRAFT     x4   FEMORAL-POPLITEAL BYPASS GRAFT Right 10/23/2015   Procedure: BYPASS GRAFT FEMORAL TO BELOW KNEE POPLITEAL ARTERY USING RIGHT NON-REVERSED GREATER SAPPHENOUS VEIN;  Surgeon: [name], MD;  Location: [place];  Service: Vascular;  Laterality: Right;   INTRAOPERATIVE ARTERIOGRAM Right 10/23/2015  Procedure: INTRA OPERATIVE ARTERIOGRAM - RIGHT LOWER LEG;  Surgeon: [name], MD;  Location: [place];  Service: Vascular;  Laterality: Right;   KNEE ARTHROSCOPY Right    LEFT AND RIGHT HEART CATHETERIZATION WITH CORONARY ANGIOGRAM N/A 09/29/2013   Procedure: LEFT AND RIGHT HEART CATHETERIZATION WITH CORONARY ANGIOGRAM;  Surgeon: Burnell Blanks, MD;  Location: [facility] CATH LAB;  Service: Cardiovascular;  Laterality: N/A;   LEFT HEART CATH AND CORS/GRAFTS ANGIOGRAPHY N/A 04/11/2020   Procedure: LEFT HEART CATH AND CORS/GRAFTS  ANGIOGRAPHY;  Surgeon: Burnell Blanks, MD;  Location: [place] CV LAB;  Service: Cardiovascular;  Laterality: N/A;   PERIPHERAL VASCULAR CATHETERIZATION N/A 04/21/2015   Procedure: Abdominal Aortogram w/Lower Extremity;  Surgeon: [name], MD;  Location: [place] CV LAB;  Service: Cardiovascular;  Laterality: N/A;   Stent to right femoral artery     TRIGGER FINGER RELEASE Bilateral    VEIN HARVEST Right 10/23/2015   Procedure: [place];  Surgeon: [name], MD;  Location: [facility] OR;  Service: Vascular;  Laterality: Right;       Family History  Problem Relation Age of Onset   Heart attack Father    Hyperlipidemia Father    Diabetes Mother    Diabetes Brother    Heart Problems Brother    Colon cancer Neg Hx     Social History   Tobacco Use   Smoking status: Every Day    Packs/day: 0.50    Years: 60.00    Pack years: 30.00    Types: Cigars, Cigarettes    Start date: 11/21/1947   Smokeless tobacco: Never   Tobacco comments:    1-2 cigars per day   Vaping Use   Vaping Use: Never used  Substance Use Topics   Alcohol use: No    Alcohol/week: 0.0 standard drinks   Drug use: No    Home Medications Prior to Admission medications   Medication Sig Start Date End Date Taking? Authorizing Provider  albuterol (PROVENTIL) (2.5 MG/3ML) 0.083% nebulizer solution Take 3 mLs (2.5 mg total) by nebulization every 6 (six) hours as needed for shortness of breath. 10/08/20   Kathie Dike, MD  albuterol (VENTOLIN HFA) 108 (90 Base) MCG/ACT inhaler Inhale 2 puffs into the lungs every 6 (six) hours as needed for wheezing or shortness of breath. 04/11/20   Debbe Odea, MD  apixaban (ELIQUIS) 5 MG TABS tablet Take 1 tablet (5 mg total) by mouth 2 (two) times daily. 05/31/21   Arnoldo Lenis, MD  aspirin 81 MG EC tablet Take 1 tablet (81 mg total) by mouth daily. 04/12/20   Debbe Odea, MD  carvedilol (COREG) 6.25 MG tablet Take 1 tablet  (6.25 mg total) by mouth 2 (two) times daily with a meal. 05/31/21   Branch, Alphonse Guild, MD  dapagliflozin propanediol (FARXIGA) 10 MG TABS tablet Take 1 tablet (10 mg total) by mouth daily before breakfast. 09/03/21   Arnoldo Lenis, MD  ENTRESTO 49-51 MG TAKE 1 TABLET TWICE A DAY 06/07/21   Hendricks Limes F, FNP  ezetimibe (ZETIA) 10 MG tablet Take 1 tablet (10 mg total) by mouth daily. 05/31/21   Arnoldo Lenis, MD  furosemide (LASIX) 40 MG tablet Take 0.5 tablets (20 mg total) by mouth daily. (MAY TAKE AN ADDITIONAL 1/2 TAB AS NEEDED FOR SWELLING) - dose decreased 08/27/2021 08/27/21   Arnoldo Lenis, MD  hydrALAZINE (APRESOLINE) 25 MG tablet Take 1.5 tablets (37.5 mg total) by  mouth 3 (three) times daily. 09/03/21 10/03/21  Arnoldo Lenis, MD  nitroGLYCERIN (NITROSTAT) 0.3 MG SL tablet Place 0.3 mg under the tongue every 5 (five) minutes as needed for chest pain.    [provider]  Potassium 99 MG TABS Take 1 tablet by mouth daily.     [provider]    Allergies    Statins and Aldactone [spironolactone]  Review of Systems   Review of Systems  Respiratory:  Positive for shortness of breath.   All other systems reviewed and are negative.  Physical Exam Updated Vital Signs BP (!) 155/59   Pulse (!) 52   Temp 97.8 F (36.6 C) (Oral)   Resp 20   Ht 5\' 11"  (1.803 m)   Wt 80.7 kg   SpO2 100%   BMI 24.83 kg/m   Physical Exam Vitals and nursing note reviewed.  Constitutional:      General: He is not in acute distress.    Appearance: Normal appearance. He is well-developed.  HENT:     Head: Normocephalic and atraumatic.     Right Ear: Hearing normal.     Left Ear: Hearing normal.     Nose: Nose normal.  Eyes:     Conjunctiva/sclera: Conjunctivae normal.     Pupils: Pupils are equal, round, and reactive to light.  Cardiovascular:     Rate and Rhythm: Regular rhythm.     Heart sounds: S1 normal and S2 normal. No murmur heard.   No friction rub. No  gallop.  Pulmonary:     Effort: Pulmonary effort is normal. Tachypnea present. No respiratory distress.     Breath sounds: Decreased breath sounds and rhonchi present.  Chest:     Chest wall: No tenderness.  Abdominal:     General: Bowel sounds are normal.     Palpations: Abdomen is soft.     Tenderness: There is no abdominal tenderness. There is no guarding or rebound. Negative signs include Murphy's sign and McBurney's sign.     Hernia: No hernia is present.  Musculoskeletal:        General: Normal range of motion.     Cervical back: Normal range of motion and neck supple.  Skin:    General: Skin is warm and dry.     Findings: No rash.  Neurological:     Mental Status: He is alert and oriented to person, place, and time.     GCS: GCS eye subscore is 4. GCS verbal subscore is 5. GCS motor subscore is 6.     Cranial Nerves: No cranial nerve deficit.     Sensory: No sensory deficit.     Coordination: Coordination normal.  Psychiatric:        Speech: Speech normal.        Behavior: Behavior normal.        Thought Content: Thought content normal.    ED Results / Procedures / Treatments   Labs (all labs ordered are listed, but only abnormal results are displayed) Labs Reviewed  CBC WITH DIFFERENTIAL/PLATELET - Abnormal; Notable for the following components:      Result Value   RBC 3.82 (*)    Hemoglobin 11.8 (*)    HCT 38.8 (*)    MCV 101.6 (*)    All other components within normal limits  BASIC METABOLIC PANEL - Abnormal; Notable for the following components:   Glucose, Bld 180 (*)    Creatinine, Ser 1.45 (*)    GFR, Estimated 47 (*)  All other components within normal limits  BRAIN NATRIURETIC PEPTIDE - Abnormal; Notable for the following components:   B Natriuretic Peptide 1,435.0 (*)    All other components within normal limits  BLOOD GAS, VENOUS - Abnormal; Notable for the following components:   pO2, Ven <31.0 (*)    Acid-Base Excess 4.5 (*)    All other  components within normal limits  TROPONIN I (HIGH SENSITIVITY) - Abnormal; Notable for the following components:   Troponin I (High Sensitivity) 21 (*)    All other components within normal limits  TROPONIN I (HIGH SENSITIVITY) - Abnormal; Notable for the following components:   Troponin I (High Sensitivity) 22 (*)    All other components within normal limits  RESP PANEL BY RT-PCR (FLU A&B, COVID) ARPGX2  LIPASE, BLOOD    EKG EKG Interpretation  Date/Time:  Tuesday September 04 2021 00:28:14 EDT Ventricular Rate:  85 PR Interval:    QRS Duration: 142 QT Interval:  384 QTC Calculation: 457 R Axis:   -66 Text Interpretation: Atrial fibrillation Ventricular premature complex Left bundle branch block Baseline wander in lead(s) V2 Confirmed by Orpah Greek ([id number]) on 09/04/2021 12:59:14 AM  Radiology DG Chest Port 1 View  Result Date: 09/04/2021 CLINICAL DATA:  Shortness of breath and cough starting Monday. EXAM: PORTABLE CHEST 1 VIEW COMPARISON:  10/06/2020 FINDINGS: Postoperative changes in the mediastinum. Cardiac enlargement. No vascular congestion, edema, or consolidation. No pleural effusions. No pneumothorax. Mediastinal contours appear intact. IMPRESSION: Cardiac enlargement.  No evidence of active pulmonary disease. Electronically Signed   By: Lucienne Capers M.D.   On: 09/04/2021 01:23    Procedures Procedures   Medications Ordered in ED Medications  methylPREDNISolone sodium succinate (SOLU-MEDROL) 125 mg/2 mL injection 125 mg (has no administration in time range)  albuterol (PROVENTIL,VENTOLIN) solution continuous neb (has no administration in time range)  ipratropium (ATROVENT) nebulizer solution 0.5 mg (has no administration in time range)    ED Course  I have reviewed the triage vital signs and the nursing notes.  Pertinent labs & imaging results that were available during my care of the patient were reviewed by me and considered in my medical decision  making (see chart for details).    MDM Rules/Calculators/A&P                           Patient with history of CHF and COPD presents to the emergency department with 1 day of progressive shortness of breath.  Patient appears tachypneic and dyspneic at arrival.  He reports severe dyspnea on exertion.  Patient reports that today he has not been able to walk to the bathroom without getting out of breath which is unusual for him.  No associated chest pain.  Patient does not appear to be significantly decompensated from a congestive heart failure standpoint.  Likely multifactorial.  COVID-negative.  Chest x-ray without any evidence of pneumonia.  Treat for COPD exacerbation, will require hospitalization for new hypoxia.  CRITICAL CARE Performed by: Orpah Greek   Total critical care time: 33 minutes  Critical care time was exclusive of separately billable procedures and treating other patients.  Critical care was necessary to treat or prevent imminent or life-threatening deterioration.  Critical care was time spent personally by me on the following activities: development of treatment plan with patient and/or surrogate as well as nursing, discussions with consultants, evaluation of patient's response to treatment, examination of patient, obtaining history from patient or  surrogate, ordering and performing treatments and interventions, ordering and review of laboratory studies, ordering and review of radiographic studies, pulse oximetry and re-evaluation of patient's condition.  Final Clinical Impression(s) / ED Diagnoses Final diagnoses:  Acute respiratory failure with hypoxia [facility])    Rx / DC Orders ED Discharge Orders     None        Mane Consolo, Gwenyth Allegra, MD 09/04/21 [phone]

## 2021-09-05 ENCOUNTER — Telehealth: Payer: Self-pay | Admitting: *Deleted

## 2021-09-05 LAB — CBC
HCT: 35.6 % — ABNORMAL LOW (ref 39.0–52.0)
Hemoglobin: 10.9 g/dL — ABNORMAL LOW (ref 13.0–17.0)
MCH: 30.2 pg (ref 26.0–34.0)
MCHC: 30.6 g/dL (ref 30.0–36.0)
MCV: 98.6 fL (ref 80.0–100.0)
Platelets: 153 10*3/uL (ref 150–400)
RBC: 3.61 MIL/uL — ABNORMAL LOW (ref 4.22–5.81)
RDW: 14.4 % (ref 11.5–15.5)
WBC: 14 10*3/uL — ABNORMAL HIGH (ref 4.0–10.5)
nRBC: 0 % (ref 0.0–0.2)

## 2021-09-05 LAB — GLUCOSE, CAPILLARY
Glucose-Capillary: 211 mg/dL — ABNORMAL HIGH (ref 70–99)
Glucose-Capillary: 233 mg/dL — ABNORMAL HIGH (ref 70–99)
Glucose-Capillary: 196 mg/dL — ABNORMAL HIGH (ref 70–99)
Glucose-Capillary: 188 mg/dL — ABNORMAL HIGH (ref 70–99)

## 2021-09-05 LAB — BASIC METABOLIC PANEL
Anion gap: 7 (ref 5–15)
BUN: 33 mg/dL — ABNORMAL HIGH (ref 8–23)
CO2: 30 mmol/L (ref 22–32)
Calcium: 10.2 mg/dL (ref 8.9–10.3)
Chloride: 102 mmol/L (ref 98–111)
Creatinine, Ser: 1.52 mg/dL — ABNORMAL HIGH (ref 0.61–1.24)
GFR, Estimated: 44 mL/min — ABNORMAL LOW (ref >60)
Glucose, Bld: 191 mg/dL — ABNORMAL HIGH (ref 70–99)
Potassium: 4.9 mmol/L (ref 3.5–5.1)
Sodium: 139 mmol/L (ref 135–145)

## 2021-09-05 MED ORDER — APIXABAN 2.5 MG PO TABS
2.5000 mg | ORAL_TABLET | Freq: Two times a day (BID) | ORAL | Status: DC
  Administered 2021-09-05 – 2021-09-06 (×2): 2.5 mg via ORAL
  Filled 2021-09-05 (×2): qty 1

## 2021-09-05 NOTE — Telephone Encounter (Signed)
Laurine Blazer, LPN  75/08/2584  2:77 PM EDT Back to Top    Notified wife Luellen Pucker), copy to pcp. States he is currently admitted at [facility] since Monday.

## 2021-09-05 NOTE — Progress Notes (Signed)
Inpatient Diabetes Program Recommendations  AACE/ADA: New Consensus Statement on Inpatient Glycemic Control   Target Ranges:  Prepandial:   less than 140 mg/dL      Peak postprandial:   less than 180 mg/dL (1-2 hours)      Critically ill patients:  140 - 180 mg/dL   Results for Gary Odonnell, Gary Odonnell (MRN [id number]) as of 09/05/2021 10:33  Ref. Range 09/04/2021 12:36 09/04/2021 17:23 09/04/2021 21:13 09/05/2021 07:27  Glucose-Capillary Latest Ref Range: 70 - 99 mg/dL 298 (H) 336 (H) 221 (H) 211 (H)    Review of Glycemic Control  Diabetes history: DM2 Outpatient Diabetes medications: Farxiga 10 mg QAM Current orders for Inpatient glycemic control: Novolog 0-15 units TID with meals, Novolog 0-5 units QHS; Prednisone 50 mg QAM  Inpatient Diabetes Program Recommendations:    Insulin: If steroids are continued, please consider ordering Novolog 4 units TID with meals for meal coverage if patient eats at least 50% of meals.  NOTE: Patient received Solumedrol 125 mg on 09/04/21 and now ordered Prednisone 50 mg QAM which is contributing to hyperglycemia.   Thanks, Barnie Alderman, RN, MSN, CDE Diabetes Coordinator Inpatient Diabetes Program [phone] (Team Pager from 8am to 5pm)

## 2021-09-05 NOTE — Telephone Encounter (Signed)
-----   Message from Arnoldo Lenis, MD sent at 09/04/2021  7:33 AM EDT ----- THyroid remains low, please forward labs to pcp. CHolesterol elevated, we have been limited on cholesterol meds he can tolerate, if he is willing to reconsider repatha which is an injection you use every 2 weeks then we could refer him to lipid clinic.   Zandra Abts MD

## 2021-09-05 NOTE — Progress Notes (Signed)
Patient Demographics:    Gary Odonnell, is a 87 y.o. male, DOB - 1932/10/28, IFO:[id number]  Admit date - 09/04/2021   Admitting Physician Bernadette Hoit, DO  Outpatient Primary MD for the patient is Loman Brooklyn, FNP  LOS - 1   Chief Complaint  Patient presents with   Shortness of Breath        Subjective:    Gary Odonnell today has no fevers, no emesis,  No chest pain,   -Cough and shortness of breath improving, -Hypoxia improving only requiring 1 to 2 L of oxygen  Assessment  & Plan :    Principal Problem:   Acute respiratory failure with hypoxia [facility])  Brief Summary:- 87 y.o. male with past medical history relevant for COPD, history of chronic hypoxic respiratory failure uses oxygen at home as needed, CAD status post prior CABG in 6767, systolic dysfunction CHF with EF in the 40 to 45% range based on echo from 10/14/2020, chronic atrial fibrillation, HTN, DM 2, PAD, history of prior stroke and history of prior left  lung cancer admitted on 09/04/2021 with acute on chronic hypoxic respiratory failure due to combination of COPD exacerbation and systolic CHF exacerbation  A/p 1) acute on chronic hypoxic respiratory failure due to combination of COPD and CHF -Continue supplemental oxygen,  management as below #2 and # 3   2) acute COPD exacerbation--- history of prior left lung cancer continue steroids, bronchodilators and mucolytics with azithromycin --Chest x-ray without acute cardiopulmonary findings, likely not pneumonia -Dyspnea at rest resolved dyspnea on exertion improving   3)HFrEF--acute on chronic systolic dysfunction CHF exacerbation, ?  Due to recent adjustment in patient's diuretic dose --BNP is elevated at 1435 -Continue Entresto, Coreg, hydralazine -EF in the 40 to 45% range based on echo from 10/14/2020 -Gentle diuresis with IV Lasix, daily weight and fluid input and  output monitoring -Hypoxia improving   4)CAD status post prior CABG in 2014--- no ACS type symptoms -Troponins are flat 21>>22 -EKG with A. fib and LBBB--not new   5)PAD/H/o CVA--continue aspirin and Zetia (statin intolerance)   6) chronic atrial fibrillation--Coreg for rate control and apixaban for stroke prophylaxis   7)DM2-recent A1c 6.3 reflecting good diabetic control PTA -Hold Farxiga -Anticipate steroid-induced hyperglycemia Use Novolog/Humalog Sliding scale insulin with Accu-Cheks/Fingersticks as ordered    8)HTN--stable, cardiac/BP meds as above #3   9)  CKD stage - 3A -Renal function is currently close to baseline, monitor renal function closely with diuresis , renally adjust medications, avoid nephrotoxic agents / dehydration  / hypotension   10)Social/Ethics--patient request DNR status with full scope of treatment   Disposition/Need for in-Hospital Stay- patient unable to be discharged at this time due to --- COPD and CHF exacerbation leading to acute on chronic hypoxic respiratory failure-*   Status is: Inpatient   Remains inpatient appropriate because: Please see disposition above   Dispo: The patient is from: Home              Anticipated d/c is to: Home              Anticipated d/c date is: 2 days              Patient currently is not medically stable to d/c.  Barriers: Not Clinically Stable-   Code Status :  -  Code Status: DNR   Family Communication:    NA (patient is alert, awake and coherent)   Consults  :    DVT Prophylaxis  :   - SCDs   apixaban (ELIQUIS) tablet 2.5 mg Start: 09/05/21 2200 SCDs Start: 09/04/21 0802 Place TED hose Start: 09/04/21 0802 apixaban (ELIQUIS) tablet 2.5 mg    Lab Results  Component Value Date   PLT 153 09/05/2021    Inpatient Medications  Scheduled Meds:  apixaban  2.5 mg Oral BID   aspirin EC  81 mg Oral Daily   azithromycin  500 mg Oral Daily   carvedilol  6.25 mg Oral BID WC   ezetimibe  10 mg Oral Daily    feeding supplement  237 mL Oral BID BM   furosemide  40 mg Intravenous Q12H   hydrALAZINE  37.5 mg Oral TID   insulin aspart  0-15 Units Subcutaneous TID WC   insulin aspart  0-5 Units Subcutaneous QHS   predniSONE  50 mg Oral Q breakfast   sacubitril-valsartan  1 tablet Oral BID   sodium chloride flush  3 mL Intravenous Q12H   sodium chloride flush  3 mL Intravenous Q12H   umeclidinium-vilanterol  1 puff Inhalation Daily   Continuous Infusions:  sodium chloride     PRN Meds:.sodium chloride, acetaminophen **OR** acetaminophen, albuterol, bisacodyl, nitroGLYCERIN, ondansetron **OR** ondansetron (ZOFRAN) IV, polyethylene glycol, sodium chloride flush, traZODone    Anti-infectives (From admission, onward)    Start     Dose/Rate Route Frequency Ordered Stop   09/04/21 1900  azithromycin (ZITHROMAX) tablet 500 mg        500 mg Oral Daily 09/04/21 1807 09/09/21 0959         Objective:   Vitals:   09/05/21 0448 09/05/21 0453 09/05/21 0846 09/05/21 1707  BP: (!) 105/55   (!) 156/128  Pulse: 83   82  Resp: 18   20  Temp: 98 F (36.7 C)   98.2 F (36.8 C)  TempSrc: Oral   Oral  SpO2: 100% 98% 97% 93%  Weight:  80.7 kg    Height:        Wt Readings from Last 3 Encounters:  09/05/21 80.7 kg  08/27/21 80.7 kg  02/22/21 83 kg     Intake/Output Summary (Last 24 hours) at 09/05/2021 1847 Last data filed at 09/05/2021 1507 Gross per 24 hour  Intake 7 ml  Output 1350 ml  Net -1343 ml     Physical Exam  Gen:- Awake Alert,  in no apparent distress  HEENT:- [place].AT, No sclera icterus Nose-  2L/min Neck-Supple Neck,No JVD,.  Lungs-improved air movement, no wheezing CV- S1, S2 normal, regular  Abd-  +ve B.Sounds, Abd Soft, No tenderness,    Extremity/Skin:- No significant edema, pedal pulses present  Psych-affect is appropriate, oriented x3 Neuro-no new focal deficits, no tremors   Data Review:   Micro Results Recent Results (from the past 240 hour(s))  Resp  Panel by RT-PCR (Flu A&B, Covid) Nasopharyngeal Swab     Status: None   Collection Time: 09/04/21  2:38 AM   Specimen: Nasopharyngeal Swab; Nasopharyngeal(NP) swabs in vial transport medium  Result Value Ref Range Status   SARS Coronavirus 2 by RT PCR NEGATIVE NEGATIVE Final    Comment: (NOTE) SARS-CoV-2 target nucleic acids are NOT DETECTED.  The SARS-CoV-2 RNA is generally detectable in upper respiratory specimens during the acute phase of infection.  The lowest concentration of SARS-CoV-2 viral copies this assay can detect is 138 copies/mL. A negative result does not preclude SARS-Cov-2 infection and should not be used as the sole basis for treatment or other patient management decisions. A negative result may occur with  improper specimen collection/handling, submission of specimen other than nasopharyngeal swab, presence of viral mutation(s) within the areas targeted by this assay, and inadequate number of viral copies(<138 copies/mL). A negative result must be combined with clinical observations, patient history, and epidemiological information. The expected result is Negative.  Fact Sheet for Patients:  EntrepreneurPulse.com.au  Fact Sheet for Healthcare Providers:  IncredibleEmployment.be  This test is no t yet approved or cleared by the [place] FDA and  has been authorized for detection and/or diagnosis of SARS-CoV-2 by FDA under an Emergency Use Authorization (EUA). This EUA will remain  in effect (meaning this test can be used) for the duration of the COVID-19 declaration under Section 564(b)(1) of the Act, 21 U.S.C.section 360bbb-3(b)(1), unless the authorization is terminated  or revoked sooner.       Influenza A by PCR NEGATIVE NEGATIVE Final   Influenza B by PCR NEGATIVE NEGATIVE Final    Comment: (NOTE) The Xpert Xpress SARS-CoV-2/FLU/RSV plus assay is intended as an aid in the diagnosis of influenza from Nasopharyngeal  swab specimens and should not be used as a sole basis for treatment. Nasal washings and aspirates are unacceptable for Xpert Xpress SARS-CoV-2/FLU/RSV testing.  Fact Sheet for Patients: EntrepreneurPulse.com.au  Fact Sheet for Healthcare Providers: IncredibleEmployment.be  This test is not yet approved or cleared by the [place] FDA and has been authorized for detection and/or diagnosis of SARS-CoV-2 by FDA under an Emergency Use Authorization (EUA). This EUA will remain in effect (meaning this test can be used) for the duration of the COVID-19 declaration under Section 564(b)(1) of the Act, 21 U.S.C. section 360bbb-3(b)(1), unless the authorization is terminated or revoked.  Performed at [facility], [address], [place], [place] [address]     Radiology Reports DG Chest [place] 1 View  Result Date: 09/04/2021 CLINICAL DATA:  Shortness of breath and cough starting Monday. EXAM: PORTABLE CHEST 1 VIEW COMPARISON:  10/06/2020 FINDINGS: Postoperative changes in the mediastinum. Cardiac enlargement. No vascular congestion, edema, or consolidation. No pleural effusions. No pneumothorax. Mediastinal contours appear intact. IMPRESSION: Cardiac enlargement.  No evidence of active pulmonary disease. Electronically Signed   By: Lucienne Capers M.D.   On: 09/04/2021 01:23     CBC Recent Labs  Lab 08/31/21 0924 09/04/21 0100 09/05/21 0613  WBC 7.3 9.3 14.0*  HGB 11.6* 11.8* 10.9*  HCT 38.3* 38.8* 35.6*  PLT 157 155 153  MCV 101.3* 101.6* 98.6  MCH 30.7 30.9 30.2  MCHC 30.3 30.4 30.6  RDW 14.6 14.6 14.4  LYMPHSABS  --  1.2  --   MONOABS  --  0.5  --   EOSABS  --  0.3  --   BASOSABS  --  0.0  --     Chemistries  Recent Labs  Lab 08/31/21 0924 09/04/21 0100 09/05/21 0613  NA 144 139 139  K 4.9 4.4 4.9  CL 108 105 102  CO2 29 26 30   GLUCOSE 140* 180* 191*  BUN 18 22 33*  CREATININE 1.33* 1.45* 1.52*  CALCIUM 9.4 9.6 10.2  MG 2.3   --   --    ------------------------------------------------------------------------------------------------------------------ No results for input(s): CHOL, HDL, LDLCALC, TRIG, CHOLHDL, LDLDIRECT in the last 72 hours.  Lab Results  Component Value Date   HGBA1C 6.3 (H) 08/31/2021   ------------------------------------------------------------------------------------------------------------------ No results for input(s): TSH, T4TOTAL, T3FREE, THYROIDAB in the last 72 hours.  Invalid input(s): FREET3 ------------------------------------------------------------------------------------------------------------------ No results for input(s): VITAMINB12, FOLATE, FERRITIN, TIBC, IRON, RETICCTPCT in the last 72 hours.  Coagulation profile No results for input(s): INR, PROTIME in the last 168 hours.  No results for input(s): DDIMER in the last 72 hours.  Cardiac Enzymes No results for input(s): CKMB, TROPONINI, MYOGLOBIN in the last 168 hours.  Invalid input(s): CK ------------------------------------------------------------------------------------------------------------------    Component Value Date/Time   BNP 1,435.0 (H) 09/04/2021 0100     Roxan Hockey M.D on 09/05/2021 at 6:47 PM  Go to [email or web address] - for contact info  [facility] - Office  [phone]

## 2021-09-05 NOTE — Plan of Care (Signed)
Pt rested during overnight. Bed alarm on. Needs 1 assist to stand and urinate. Vitals stable.  Problem: Education: Goal: Knowledge of General Education information will improve Description: Including pain rating scale, medication(s)/side effects and non-pharmacologic comfort measures Outcome: Progressing   Problem: Health Behavior/Discharge Planning: Goal: Ability to manage health-related needs will improve Outcome: Progressing   Problem: Clinical Measurements: Goal: Ability to maintain clinical measurements within normal limits will improve Outcome: Progressing Goal: Will remain free from infection Outcome: Progressing Goal: Diagnostic test results will improve Outcome: Progressing Goal: Respiratory complications will improve Outcome: Progressing Goal: Cardiovascular complication will be avoided Outcome: Progressing   Problem: Activity: Goal: Risk for activity intolerance will decrease Outcome: Progressing   Problem: Nutrition: Goal: Adequate nutrition will be maintained Outcome: Progressing   Problem: Coping: Goal: Level of anxiety will decrease Outcome: Progressing   Problem: Elimination: Goal: Will not experience complications related to bowel motility Outcome: Progressing Goal: Will not experience complications related to urinary retention Outcome: Progressing   Problem: Pain Managment: Goal: General experience of comfort will improve Outcome: Progressing   Problem: Safety: Goal: Ability to remain free from injury will improve Outcome: Progressing   Problem: Skin Integrity: Goal: Risk for impaired skin integrity will decrease Outcome: Progressing

## 2021-09-06 ENCOUNTER — Other Ambulatory Visit: Payer: Self-pay

## 2021-09-06 DIAGNOSIS — J441 Chronic obstructive pulmonary disease with (acute) exacerbation: Secondary | ICD-10-CM | POA: Diagnosis present

## 2021-09-06 LAB — GLUCOSE, CAPILLARY
Glucose-Capillary: 173 mg/dL — ABNORMAL HIGH (ref 70–99)
Glucose-Capillary: 119 mg/dL — ABNORMAL HIGH (ref 70–99)

## 2021-09-06 MED ORDER — ACETAMINOPHEN 325 MG PO TABS: 650.0000 mg | ORAL_TABLET | Freq: Four times a day (QID) | ORAL | 0 refills | Status: AC | PRN

## 2021-09-06 MED ORDER — PREDNISONE 50 MG PO TABS: 50.0000 mg | ORAL_TABLET | Freq: Every day | ORAL | 0 refills | Status: DC

## 2021-09-06 MED ORDER — ENTRESTO 49-51 MG PO TABS: 1.0000 | ORAL_TABLET | Freq: Two times a day (BID) | ORAL | 3 refills | Status: DC

## 2021-09-06 MED ORDER — ASPIRIN 81 MG PO TBEC: 81.0000 mg | DELAYED_RELEASE_TABLET | Freq: Every day | ORAL | 5 refills | Status: AC

## 2021-09-06 MED ORDER — AZITHROMYCIN 500 MG PO TABS: 500.0000 mg | ORAL_TABLET | Freq: Every day | ORAL | 0 refills | Status: AC

## 2021-09-06 MED ORDER — UMECLIDINIUM-VILANTEROL 62.5-25 MCG/INH IN AEPB: 1.0000 | INHALATION_SPRAY | Freq: Every day | RESPIRATORY_TRACT | 3 refills | Status: AC

## 2021-09-06 MED ORDER — CARVEDILOL 6.25 MG PO TABS: 6.2500 mg | ORAL_TABLET | Freq: Two times a day (BID) | ORAL | 3 refills | Status: AC

## 2021-09-06 MED ORDER — FUROSEMIDE 40 MG PO TABS: 40.0000 mg | ORAL_TABLET | Freq: Every day | ORAL | 2 refills | Status: AC

## 2021-09-06 MED ORDER — EMPAGLIFLOZIN 10 MG PO TABS
10.0000 mg | ORAL_TABLET | Freq: Every day | ORAL | 6 refills | Status: DC
Start: 2021-09-06 — End: 2021-12-03

## 2021-09-06 MED ORDER — APIXABAN 2.5 MG PO TABS
2.5000 mg | ORAL_TABLET | Freq: Two times a day (BID) | ORAL | 3 refills | Status: DC
Start: 2021-09-06 — End: 2022-01-09

## 2021-09-06 MED ORDER — ENTRESTO 49-51 MG PO TABS: 1.0000 | ORAL_TABLET | Freq: Two times a day (BID) | ORAL | 3 refills | Status: AC

## 2021-09-06 NOTE — Progress Notes (Signed)
Discharge instructions given to patient.  Patient verbalized understanding.  Encouraged to call the doctor for concerns.  Discharged home.

## 2021-09-06 NOTE — Plan of Care (Signed)
Pt rested during overnight. On room air at this time. Vitals stable.  Problem: Education: Goal: Knowledge of General Education information will improve Description: Including pain rating scale, medication(s)/side effects and non-pharmacologic comfort measures Outcome: Progressing   Problem: Health Behavior/Discharge Planning: Goal: Ability to manage health-related needs will improve Outcome: Progressing   Problem: Clinical Measurements: Goal: Ability to maintain clinical measurements within normal limits will improve Outcome: Progressing Goal: Will remain free from infection Outcome: Progressing Goal: Diagnostic test results will improve Outcome: Progressing Goal: Respiratory complications will improve Outcome: Progressing Goal: Cardiovascular complication will be avoided Outcome: Progressing   Problem: Activity: Goal: Risk for activity intolerance will decrease Outcome: Progressing   Problem: Nutrition: Goal: Adequate nutrition will be maintained Outcome: Progressing   Problem: Coping: Goal: Level of anxiety will decrease Outcome: Progressing   Problem: Elimination: Goal: Will not experience complications related to bowel motility Outcome: Progressing Goal: Will not experience complications related to urinary retention Outcome: Progressing   Problem: Pain Managment: Goal: General experience of comfort will improve Outcome: Progressing   Problem: Safety: Goal: Ability to remain free from injury will improve Outcome: Progressing   Problem: Skin Integrity: Goal: Risk for impaired skin integrity will decrease Outcome: Progressing

## 2021-09-06 NOTE — Discharge Summary (Signed)
Gary Odonnell, is a 87 y.o. male  DOB [birth year]  MRN [id number].  Admission date:  09/04/2021  Admitting Physician  Bernadette Hoit, DO  Discharge Date:  09/06/2021   Primary MD  Loman Brooklyn, FNP  Recommendations for primary care physician for things to follow:   1) you are taking apixaban/Eliquis so please Avoid ibuprofen/Advil/Aleve/Motrin/Goody Powders/Naproxen/BC powders/Meloxicam/Diclofenac/Indomethacin and other Nonsteroidal anti-inflammatory medications as these will make you more likely to bleed and can cause stomach ulcers, can also cause Kidney problems.   2) please note that there has been a few adjustments to your medications--- your Eliquis has been decreased from 5 mg to 2.5 mg twice daily-  3) please follow-up to primary care physician within a week for recheck and reevaluation   Admission Diagnosis  Acute respiratory failure with hypoxia ([place]) [J96.01]   Discharge Diagnosis  Acute respiratory failure with hypoxia ([place]) [J96.01]   Principal Problem:   Acute respiratory failure with hypoxia ([place]) Active Problems:   HTN (hypertension)   CAD (coronary atherosclerotic disease)   S/P CABG x 4   PAD (peripheral artery disease) ([place])   A-fib ([place])   DM type 2 (diabetes mellitus, type 2) ([place])   CKD (chronic kidney disease) stage 3, GFR 30-59 ml/min (HCC)   Acute on chronic combined systolic and diastolic CHF (congestive heart failure) ([place])   COPD with acute exacerbation ([place])      Past Medical History:  Diagnosis Date   Atrial fibrillation (HCC)    CHF (congestive heart failure) ([place])    a. EF 40-45% in 2015, at EF 30-35% by echo in 12/2018   COPD (chronic obstructive pulmonary disease) ([place])    Coronary artery disease    a. s/p CABG with most recent cath in 2014 showing patent LIMA-LAD, occluded Radial to OM1-OM2-PDA and occluded RCA with L--> R collaterals   Diabetic  nephropathy (HCC)    Essential hypertension    Hyperlipidemia    PAD (peripheral artery disease) ([place])    Pneumonia 09/2013   Primary cancer of left lower lobe of lung ([place]) 02/02/2015   Radiation 02/2015   Spinal stenosis    Stroke ([place]) 11/2008   Type 2 diabetes mellitus ([place])     Past Surgical History:  Procedure Laterality Date   Angioplasty to Left femoral artery     CARPAL TUNNEL RELEASE Left 05/11/2014   Procedure: LEFT CARPAL TUNNEL RELEASE;  Surgeon: Carole Civil, MD;  Location: [facility];  Service: Orthopedics;  Laterality: Left;   COLONOSCOPY N/A 01/24/2015   Procedure: COLONOSCOPY;  Surgeon: Danie Binder, MD;  Location: [facility];  Service: Endoscopy;  Laterality: N/A;  130   CORONARY ARTERY BYPASS GRAFT     x4   FEMORAL-POPLITEAL BYPASS GRAFT Right 10/23/2015   Procedure: BYPASS GRAFT FEMORAL TO BELOW KNEE POPLITEAL ARTERY USING RIGHT NON-REVERSED GREATER SAPPHENOUS VEIN;  Surgeon: [name], MD;  Location: [facility];  Service: Vascular;  Laterality: Right;   INTRAOPERATIVE ARTERIOGRAM Right 10/23/2015   Procedure: INTRA OPERATIVE  ARTERIOGRAM - RIGHT LOWER LEG;  Surgeon: [name], MD;  Location: [facility];  Service: Vascular;  Laterality: Right;   KNEE ARTHROSCOPY Right    LEFT AND RIGHT HEART CATHETERIZATION WITH CORONARY ANGIOGRAM N/A 09/29/2013   Procedure: LEFT AND RIGHT HEART CATHETERIZATION WITH CORONARY ANGIOGRAM;  Surgeon: Burnell Blanks, MD;  Location: [facility] CATH LAB;  Service: Cardiovascular;  Laterality: N/A;   LEFT HEART CATH AND CORS/GRAFTS ANGIOGRAPHY N/A 04/11/2020   Procedure: LEFT HEART CATH AND CORS/GRAFTS ANGIOGRAPHY;  Surgeon: Burnell Blanks, MD;  Location: [facility] CV LAB;  Service: Cardiovascular;  Laterality: N/A;   PERIPHERAL VASCULAR CATHETERIZATION N/A 04/21/2015   Procedure: Abdominal Aortogram w/Lower Extremity;  Surgeon: [name], MD;  Location: [place] [facility] [facility] LAB;  Service: Cardiovascular;  Laterality: N/A;    Stent to right femoral artery     TRIGGER FINGER RELEASE Bilateral    VEIN HARVEST Right 10/23/2015   Procedure: [place];  Surgeon: [name], MD;  Location: [facility];  Service: Vascular;  Laterality: Right;       HPI  from the history and physical done on the day of admission:   Gary Odonnell  is a 87 y.o. male with past medical history relevant for COPD, history of chronic hypoxic respiratory failure uses oxygen at home as needed, CAD status post prior CABG in 7096, systolic dysfunction CHF with EF in the 40 to 45% range based on echo from 10/14/2020, chronic atrial fibrillation, HTN, DM 2, PAD, history of prior stroke and history of prior left  lung cancer presents to the ED with increasing shortness of breath increasing dyspnea and hypoxia having to use oxygen more - Patient recently saw his cardiologist about 8 days ago and diuretics with reduced as he was doing better at the time -He has cough, no fevers no chills -No chest pains no vomiting or diarrhea -Chest x-ray without acute cardiopulmonary findings -BNP is elevated at 1435 -Troponins are flat 21>>22 -WBCs 9.3 with hemoglobin of 11.8 platelets 155 -Creatinine is 1.45 -COVID-negative - Patient was given steroids, bronchodilators and IV Lasix    Hospital Course:     Brief Summary:- 87 y.o. male with past medical history relevant for COPD, history of chronic hypoxic respiratory failure uses oxygen at home as needed, CAD status post prior CABG in 2836, systolic dysfunction CHF with EF in the 40 to 45% range based on echo from 10/14/2020, chronic atrial fibrillation, HTN, DM 2, PAD, history of prior stroke and history of prior left  lung cancer admitted on 09/04/2021 with acute on chronic hypoxic respiratory failure due to combination of COPD exacerbation and systolic CHF exacerbation   A/p 1) acute on chronic hypoxic respiratory failure due to combination of COPD and CHF --- Hypoxia at rest  resolved, post ambulation O2 sats 98 to 92% on room air  management as below #2 and # 3   2) acute COPD exacerbation--- history of prior left lung cancer -Overall much improved with steroids, bronchodilators and mucolytics with azithromycin --Chest x-ray without acute cardiopulmonary findings,  -Hypoxia resolved as above #1 okay to discharge home on p.o. prednisone azithromycin and bronchodilators   3)HFrEF--acute on chronic systolic dysfunction CHF exacerbation, ?  Due to recent adjustment in patient's diuretic dose --BNP was elevated at 1435 -Continue Entresto, Coreg, hydralazine -EF in the 40 to 45% range based on echo from 10/14/2020 -Much improved with diuresis, hypoxia resolved -Okay to discharge on Lasix 40 mg daily  4)CAD status post prior CABG in 2014--- no ACS type symptoms -Troponins are flat 21>>22 -EKG with A. fib and LBBB--not new -Continue aspirin    5)PAD/H/o CVA--continue aspirin and Zetia (statin intolerance)   6) chronic atrial fibrillation--Coreg for rate control and decreased apixaban 2.5 mg bid for stroke prophylaxis   7)DM2-recent A1c 6.3 reflecting good diabetic control PTA -Restart Jardiance -  8)HTN--stable, cardiac/BP meds as above #3   9)  CKD stage - 3A -Renal function is currently close to baseline, 10)Social/Ethics--patient request DNR status with full scope of treatment   Disposition/--Home Dispo: The patient is from: Home              Anticipated d/c is to: Home             Code Status :  -  Code Status: DNR    Family Communication:    NA (patient is alert, awake and coherent)  Discharge Condition: Stable without hypoxia  Follow UP--- PCP for recheck  Diet and Activity recommendation:  As advised  Discharge Instructions    Discharge Instructions     Call MD for:  difficulty breathing, headache or visual disturbances   Complete by: As directed    Call MD for:  persistant dizziness or light-headedness   Complete by: As directed     Call MD for:  persistant nausea and vomiting   Complete by: As directed    Call MD for:  temperature >100.4   Complete by: As directed    Diet - low sodium heart healthy   Complete by: As directed    Diet Carb Modified   Complete by: As directed    Discharge instructions   Complete by: As directed    1) you are taking apixaban/Eliquis so please Avoid ibuprofen/Advil/Aleve/Motrin/Goody Powders/Naproxen/BC powders/Meloxicam/Diclofenac/Indomethacin and other Nonsteroidal anti-inflammatory medications as these will make you more likely to bleed and can cause stomach ulcers, can also cause Kidney problems.   2) please note that there has been a few adjustments to your medications--- your Eliquis has been decreased from 5 mg to 2.5 mg twice daily-  3) please follow-up to primary care physician within a week for recheck and reevaluation   Increase activity slowly   Complete by: As directed         Discharge Medications     Allergies as of 09/06/2021       Reactions   Statins Other (See Comments)   Intolerance per outpt Cardiology notes   Aldactone [spironolactone] Diarrhea        Medication List     TAKE these medications    acetaminophen 325 MG tablet Commonly known as: TYLENOL Take 2 tablets (650 mg total) by mouth every 6 (six) hours as needed for mild pain or headache (or Fever >/= 101).   albuterol 108 (90 Base) MCG/ACT inhaler Commonly known as: VENTOLIN HFA Inhale 2 puffs into the lungs every 6 (six) hours as needed for wheezing or shortness of breath.   albuterol (2.5 MG/3ML) 0.083% nebulizer solution Commonly known as: PROVENTIL Take 3 mLs (2.5 mg total) by nebulization every 6 (six) hours as needed for shortness of breath.   apixaban 2.5 MG Tabs tablet Commonly known as: ELIQUIS Take 1 tablet (2.5 mg total) by mouth 2 (two) times daily. What changed:  medication strength how much to take   aspirin 81 MG EC tablet Take 1 tablet (81 mg total) by mouth daily  with breakfast. What changed: when to take this  azithromycin 500 MG tablet Commonly known as: ZITHROMAX Take 1 tablet (500 mg total) by mouth daily for 5 days.   carvedilol 6.25 MG tablet Commonly known as: COREG Take 1 tablet (6.25 mg total) by mouth 2 (two) times daily with a meal.   empagliflozin 10 MG Tabs tablet Commonly known as: Jardiance Take 1 tablet (10 mg total) by mouth daily before breakfast.   Entresto 49-51 MG Generic drug: sacubitril-valsartan Take 1 tablet by mouth 2 (two) times daily.   ezetimibe 10 MG tablet Commonly known as: ZETIA Take 1 tablet (10 mg total) by mouth daily.   furosemide 40 MG tablet Commonly known as: LASIX Take 1 tablet (40 mg total) by mouth daily. (MAY TAKE AN ADDITIONAL 1/2 TAB AS NEEDED FOR SWELLING) - What changed:  how much to take additional instructions   hydrALAZINE 25 MG tablet Commonly known as: APRESOLINE Take 1.5 tablets (37.5 mg total) by mouth 3 (three) times daily.   nitroGLYCERIN 0.3 MG SL tablet Commonly known as: NITROSTAT Place 0.3 mg under the tongue every 5 (five) minutes as needed for chest pain.   Potassium 99 MG Tabs Take 1 tablet by mouth daily.   predniSONE 50 MG tablet Commonly known as: DELTASONE Take 1 tablet (50 mg total) by mouth daily with breakfast. Start taking on: September 07, 2021   umeclidinium-vilanterol 62.5-25 MCG/INH Aepb Commonly known as: ANORO ELLIPTA Inhale 1 puff into the lungs daily. Start taking on: September 07, 2021       Major procedures and Radiology Reports - PLEASE review detailed and final reports for all details, in brief -   DG Chest Port 1 View  Result Date: 09/04/2021 CLINICAL DATA:  Shortness of breath and cough starting Monday. EXAM: PORTABLE CHEST 1 VIEW COMPARISON:  10/06/2020 FINDINGS: Postoperative changes in the mediastinum. Cardiac enlargement. No vascular congestion, edema, or consolidation. No pleural effusions. No pneumothorax. Mediastinal contours  appear intact. IMPRESSION: Cardiac enlargement.  No evidence of active pulmonary disease. Electronically Signed   By: Lucienne Capers M.D.   On: 09/04/2021 01:23    Micro Results   Recent Results (from the past 240 hour(s))  Resp Panel by RT-PCR (Flu A&B, Covid) Nasopharyngeal Swab     Status: None   Collection Time: 09/04/21  2:38 AM   Specimen: Nasopharyngeal Swab; Nasopharyngeal(NP) swabs in vial transport medium  Result Value Ref Range Status   SARS Coronavirus 2 by RT PCR NEGATIVE NEGATIVE Final    Comment: (NOTE) SARS-CoV-2 target nucleic acids are NOT DETECTED.  The SARS-CoV-2 RNA is generally detectable in upper respiratory specimens during the acute phase of infection. The lowest concentration of SARS-CoV-2 viral copies this assay can detect is 138 copies/mL. A negative result does not preclude SARS-Cov-2 infection and should not be used as the sole basis for treatment or other patient management decisions. A negative result may occur with  improper specimen collection/handling, submission of specimen other than nasopharyngeal swab, presence of viral mutation(s) within the areas targeted by this assay, and inadequate number of viral copies(<138 copies/mL). A negative result must be combined with clinical observations, patient history, and epidemiological information. The expected result is Negative.  Fact Sheet for Patients:  EntrepreneurPulse.com.au  Fact Sheet for Healthcare Providers:  IncredibleEmployment.be  This test is no t yet approved or cleared by the [place] FDA and  has been authorized for detection and/or diagnosis of SARS-CoV-2 by FDA under an Emergency Use Authorization (EUA). This EUA will remain  in effect (meaning this test  can be used) for the duration of the COVID-19 declaration under Section 564(b)(1) of the Act, 21 U.S.C.section 360bbb-3(b)(1), unless the authorization is terminated  or revoked sooner.        Influenza A by PCR NEGATIVE NEGATIVE Final   Influenza B by PCR NEGATIVE NEGATIVE Final    Comment: (NOTE) The Xpert Xpress SARS-CoV-2/FLU/RSV plus assay is intended as an aid in the diagnosis of influenza from Nasopharyngeal swab specimens and should not be used as a sole basis for treatment. Nasal washings and aspirates are unacceptable for Xpert Xpress SARS-CoV-2/FLU/RSV testing.  Fact Sheet for Patients: EntrepreneurPulse.com.au  Fact Sheet for Healthcare Providers: IncredibleEmployment.be  This test is not yet approved or cleared by the [place] FDA and has been authorized for detection and/or diagnosis of SARS-CoV-2 by FDA under an Emergency Use Authorization (EUA). This EUA will remain in effect (meaning this test can be used) for the duration of the COVID-19 declaration under Section 564(b)(1) of the Act, 21 U.S.C. section 360bbb-3(b)(1), unless the authorization is terminated or revoked.  Performed at [facility], [address] [place], [place] [address]        Today   Subjective    Gary Odonnell today has no new complaints No chest pains no palpitations no dizziness          Patient has been seen and examined prior to discharge   Objective   Blood pressure (!) 111/54, pulse (!) 59, temperature 97.7 F (36.5 C), temperature source Oral, resp. rate 15, height 5\' 11"  (1.803 m), weight 81.9 kg, SpO2 94 %.   Intake/Output Summary (Last 24 hours) at 09/06/2021 1229 Last data filed at 09/06/2021 0900 Gross per 24 hour  Intake 203 ml  Output 725 ml  Net -522 ml    Exam Gen:- Awake Alert, no acute distress  HEENT:- .AT, No sclera icterus Neck-Supple Neck,No JVD,.  Lungs-much improved air movement, no wheezing CV- S1, S2 normal, irregular, prior sternotomy scar Abd-  +ve B.Sounds, Abd Soft, No tenderness,    Extremity/Skin:- No  edema,   good pulses Psych-affect is appropriate, oriented x3 Neuro-no new  focal deficits, no tremors    Data Review   CBC w Diff:  Lab Results  Component Value Date   WBC 14.0 (H) 09/05/2021   HGB 10.9 (L) 09/05/2021   HGB 13.6 06/06/2020   HCT 35.6 (L) 09/05/2021   HCT 41.6 06/06/2020   PLT 153 09/05/2021   PLT 141 (L) 06/06/2020   LYMPHOPCT 13 09/04/2021   MONOPCT 5 09/04/2021   EOSPCT 3 09/04/2021   BASOPCT 0 09/04/2021    CMP:  Lab Results  Component Value Date   NA 139 09/05/2021   NA 142 09/06/2020   K 4.9 09/05/2021   CL 102 09/05/2021   CO2 30 09/05/2021   BUN 33 (H) 09/05/2021   BUN 18 09/06/2020   CREATININE 1.52 (H) 09/05/2021   PROT 6.4 (L) 10/07/2020   PROT 6.5 09/06/2020   ALBUMIN 3.4 (L) 10/07/2020   ALBUMIN 4.3 09/06/2020   BILITOT 2.3 (H) 10/07/2020   BILITOT 1.1 09/06/2020   ALKPHOS 54 10/07/2020   AST 10 (L) 10/07/2020   ALT 12 10/07/2020  .   Total Discharge time is about 33 minutes  Roxan Hockey M.D on 09/06/2021 at 12:29 PM  Go to [email or web address] -  for contact info  [facility] - Office  [phone]

## 2021-09-06 NOTE — Progress Notes (Signed)
Farxiga denied by [employer]. Per provider, switched pt to Jardiance 10 mg tablets.

## 2021-09-06 NOTE — Discharge Instructions (Signed)
1) you are taking apixaban/Eliquis so please Avoid ibuprofen/Advil/Aleve/Motrin/Goody Powders/Naproxen/BC powders/Meloxicam/Diclofenac/Indomethacin and other Nonsteroidal anti-inflammatory medications as these will make you more likely to bleed and can cause stomach ulcers, can also cause Kidney problems.   2) please note that there has been a few adjustments to your medications--- your Eliquis has been decreased from 5 mg to 2.5 mg twice daily-  3) please follow-up to primary care physician within a week for recheck and reevaluation

## 2021-09-07 ENCOUNTER — Telehealth: Payer: Self-pay

## 2021-09-07 NOTE — Telephone Encounter (Signed)
Spoke with patient's wife about scheduling a transition of care appointment with PCP after discharge from hospital on 09/06/21.  Patient refuses appointment, does not want to come in to be seen.  Explained importance of follow up but patient still refused.

## 2021-09-27 ENCOUNTER — Ambulatory Visit (INDEPENDENT_AMBULATORY_CARE_PROVIDER_SITE_OTHER): Payer: Medicare Other

## 2021-09-27 DIAGNOSIS — I6523 Occlusion and stenosis of bilateral carotid arteries: Secondary | ICD-10-CM

## 2021-09-27 DIAGNOSIS — I6529 Occlusion and stenosis of unspecified carotid artery: Secondary | ICD-10-CM

## 2021-10-04 ENCOUNTER — Telehealth: Payer: Self-pay | Admitting: *Deleted

## 2021-10-04 NOTE — Telephone Encounter (Signed)
Patient's wife Luellen Pucker informed. Copy sent to PCP

## 2021-10-04 NOTE — Telephone Encounter (Signed)
-----   Message from Arnoldo Lenis, MD sent at 10/02/2021  2:58 PM EST ----- Carotid US shows mild to moderate blockages, just something to continue to monitor at this time  Zandra Abts MD

## 2021-10-05 ENCOUNTER — Other Ambulatory Visit: Payer: Self-pay | Admitting: Cardiology

## 2021-10-05 DIAGNOSIS — I159 Secondary hypertension, unspecified: Secondary | ICD-10-CM

## 2021-11-14 ENCOUNTER — Other Ambulatory Visit (HOSPITAL_COMMUNITY): Payer: Self-pay | Admitting: Cardiology

## 2021-11-14 DIAGNOSIS — I6523 Occlusion and stenosis of bilateral carotid arteries: Secondary | ICD-10-CM

## 2021-12-03 ENCOUNTER — Encounter: Payer: Self-pay | Admitting: Cardiology

## 2021-12-03 ENCOUNTER — Ambulatory Visit (INDEPENDENT_AMBULATORY_CARE_PROVIDER_SITE_OTHER): Payer: Medicare Other | Admitting: Cardiology

## 2021-12-03 VITALS — BP 140/60 | HR 84 | Ht 71.0 in | Wt 169.8 lb

## 2021-12-03 DIAGNOSIS — I10 Essential (primary) hypertension: Secondary | ICD-10-CM

## 2021-12-03 DIAGNOSIS — I5022 Chronic systolic (congestive) heart failure: Secondary | ICD-10-CM

## 2021-12-03 DIAGNOSIS — I2510 Atherosclerotic heart disease of native coronary artery without angina pectoris: Secondary | ICD-10-CM | POA: Diagnosis not present

## 2021-12-03 DIAGNOSIS — E782 Mixed hyperlipidemia: Secondary | ICD-10-CM | POA: Diagnosis not present

## 2021-12-03 NOTE — Progress Notes (Signed)
Clinical Summary Gary Odonnell is a 88 y.o.male seen today for follow up of the following medical problems.    1. CAD/ICM/Chronic systolic heart failure - history of prior CABG 2003 in [place]   - 09/2013 admission to [facility] with decompensated heart failure. Workup included an echo which showed an LVEF of 15%. He was transferred to [facility] for further management.   - cath 09/2013 showed LM 30%, LAD 100% proximal, LCX 50% ostial, RCA 100% mid with distal vessel filling with right to right and left to right collaterals. LIMA-LAD patent, graft from [place] to OM1, OM2, PDA occluded. No PCI targets, recommendations for medical management. RHC mean PA 43, wedge 22, CI 1.74.   - repeat echo 04/2014 shows LVEF improved to 40-45%.    - 01/2018 echo LVEF 35-40%, grade II diastolic dysfunction.              -admit 03/2020 with NSTEMI,actually presented with fall out of bed and N/V, no chest pain 03/2020 cath: severe 3 vessel disease, 1/4 patent grafts as reported below. Overall stable disease, recs for medical therapy - 03/2020 echo LVEF 35-40%, grade III DDx, PASP 56    -09/2020 echo LVEF 40-45%     -  given extensive CAD has been on eliquis and ASA     -diarrhea on aldactone, stopped taking. We did discuss startingjardiance 10mg  daily, was not interested in starting.  - 08/2021 admission with COPD exacerbation and acute on chronic systolic HF, discharged on lasix 40mg  daily.  - chronic SOB unchanged. Ongoing cough,wheezing. - no recent edema.    2. Afib - new diagnosis during 12/27/2018 ER visit. Eliquis started in clniic 12/29/17 - CHADS2Vasc score is (age x2, CHF, HTN, stroke x2, CAD) is 7     - no palpitations - no bleedingo n eliquis     3. Hyperlipidemia   - lipitor and crestor have caused prior muscle aches, not on statin   - 06/2017 TC 196 TG 105 HDL 36 LDL 139  . Has been resistant to pcsk9 inhibitors.    - 08/2021 TC 164 TG 53 HDL 36 LDL 117 - still reluctant for  pcsk9i      4. Lung CA - has completed radiation treatment.   - followed by Dr Dwana Curd   5. PAD - followed by vascular, previous stenting to left external iliac and right common iliac.   - s/p right femoral to below-knee popliteal bypass with vein 10/23/2015.   - some legs pains at about 1/2 block, thighs which is stable.  - he is not interetsted in seeing vascular at this time  09/2021 carotid US: RICA 66-29%, LICA1-39%   6. HTN - he is compliant with meds     7. Hypothyroidism - patient has been reluctant for treatment. - followed by pcp   Past Medical History:  Diagnosis Date   Atrial fibrillation ([place])    CHF (congestive heart failure) ([place])    a. EF 40-45% in 2015, at EF 30-35% by echo in 12/2018   COPD (chronic obstructive pulmonary disease) ([place])    Coronary artery disease    a. s/p CABG with most recent cath in 2014 showing patent LIMA-LAD, occluded Radial to OM1-OM2-PDA and occluded RCA with L--> R collaterals   Diabetic nephropathy ([place])    Essential hypertension    Hyperlipidemia    PAD (peripheral artery disease) ([place])    Pneumonia 09/2013   Primary cancer of left lower lobe of lung (  [place]) 02/02/2015   Radiation 02/2015   Spinal stenosis    Stroke [facility]) 11/2008   Type 2 diabetes mellitus (HCC)      Allergies  Allergen Reactions   Statins Other (See Comments)    Intolerance per outpt Cardiology notes   Aldactone [Spironolactone] Diarrhea     Current Outpatient Medications  Medication Sig Dispense Refill   acetaminophen (TYLENOL) 325 MG tablet Take 2 tablets (650 mg total) by mouth every 6 (six) hours as needed for mild pain or headache (or Fever >/= 101). 12 tablet 0   albuterol (PROVENTIL) (2.5 MG/3ML) 0.083% nebulizer solution Take 3 mLs (2.5 mg total) by nebulization every 6 (six) hours as needed for shortness of breath. 75 mL 12   albuterol (VENTOLIN HFA) 108 (90 Base) MCG/ACT inhaler Inhale 2 puffs into the lungs every 6 (six) hours as needed for  wheezing or shortness of breath. (Patient not taking: Reported on 09/04/2021) 8 g 0   apixaban (ELIQUIS) 2.5 MG TABS tablet Take 1 tablet (2.5 mg total) by mouth 2 (two) times daily. 60 tablet 3   aspirin 81 MG EC tablet Take 1 tablet (81 mg total) by mouth daily with breakfast. 30 tablet 5   carvedilol (COREG) 6.25 MG tablet Take 1 tablet (6.25 mg total) by mouth 2 (two) times daily with a meal. 180 tablet 3   empagliflozin (JARDIANCE) 10 MG TABS tablet Take 1 tablet (10 mg total) by mouth daily before breakfast. 30 tablet 6   ezetimibe (ZETIA) 10 MG tablet Take 1 tablet (10 mg total) by mouth daily. 90 tablet 3   furosemide (LASIX) 40 MG tablet Take 1 tablet (40 mg total) by mouth daily. (MAY TAKE AN ADDITIONAL 1/2 TAB AS NEEDED FOR SWELLING) - 30 tablet 2   hydrALAZINE (APRESOLINE) 25 MG tablet TAKE 1 AND 1/2 TABLETS(37.5 MG) BY MOUTH THREE TIMES DAILY 135 tablet 11   nitroGLYCERIN (NITROSTAT) 0.3 MG SL tablet Place 0.3 mg under the tongue every 5 (five) minutes as needed for chest pain.     Potassium 99 MG TABS Take 1 tablet by mouth daily.      predniSONE (DELTASONE) 50 MG tablet Take 1 tablet (50 mg total) by mouth daily with breakfast. 5 tablet 0   sacubitril-valsartan (ENTRESTO) 49-51 MG Take 1 tablet by mouth 2 (two) times daily. 180 tablet 3   umeclidinium-vilanterol (ANORO ELLIPTA) 62.5-25 MCG/INH AEPB Inhale 1 puff into the lungs daily. 60 each 3   No current facility-administered medications for this visit.     Past Surgical History:  Procedure Laterality Date   Angioplasty to Left femoral artery     CARPAL TUNNEL RELEASE Left 05/11/2014   Procedure: LEFT CARPAL TUNNEL RELEASE;  Surgeon: Carole Civil, MD;  Location: [facility];  Service: Orthopedics;  Laterality: Left;   COLONOSCOPY N/A 01/24/2015   Procedure: COLONOSCOPY;  Surgeon: Danie Binder, MD;  Location: [facility];  Service: Endoscopy;  Laterality: N/A;  130   CORONARY ARTERY BYPASS GRAFT     x4   FEMORAL-POPLITEAL  BYPASS GRAFT Right 10/23/2015   Procedure: BYPASS GRAFT FEMORAL TO BELOW KNEE POPLITEAL ARTERY USING RIGHT NON-REVERSED GREATER SAPPHENOUS VEIN;  Surgeon: [name], MD;  Location: [facility];  Service: Vascular;  Laterality: Right;   INTRAOPERATIVE ARTERIOGRAM Right 10/23/2015   Procedure: INTRA OPERATIVE ARTERIOGRAM - RIGHT LOWER LEG;  Surgeon: [name], MD;  Location: [facility];  Service: Vascular;  Laterality: Right;   KNEE ARTHROSCOPY Right  LEFT AND RIGHT HEART CATHETERIZATION WITH CORONARY ANGIOGRAM N/A 09/29/2013   Procedure: LEFT AND RIGHT HEART CATHETERIZATION WITH CORONARY ANGIOGRAM;  Surgeon: Burnell Blanks, MD;  Location: [facility] CATH LAB;  Service: Cardiovascular;  Laterality: N/A;   LEFT HEART CATH AND CORS/GRAFTS ANGIOGRAPHY N/A 04/11/2020   Procedure: LEFT HEART CATH AND CORS/GRAFTS ANGIOGRAPHY;  Surgeon: Burnell Blanks, MD;  Location: [facility] CV LAB;  Service: Cardiovascular;  Laterality: N/A;   PERIPHERAL VASCULAR CATHETERIZATION N/A 04/21/2015   Procedure: Abdominal Aortogram w/Lower Extremity;  Surgeon: [name], MD;  Location: [facility] CV LAB;  Service: Cardiovascular;  Laterality: N/A;   Stent to right femoral artery     TRIGGER FINGER RELEASE Bilateral    VEIN HARVEST Right 10/23/2015   Procedure: [place];  Surgeon: [name], MD;  Location: [facility];  Service: Vascular;  Laterality: Right;     Allergies  Allergen Reactions   Statins Other (See Comments)    Intolerance per outpt Cardiology notes   Aldactone [Spironolactone] Diarrhea      Family History  Problem Relation Age of Onset   Heart attack Father    Hyperlipidemia Father    Diabetes Mother    Diabetes Brother    Heart Problems Brother    Colon cancer Neg Hx      Social History Gary Odonnell reports that he has been smoking cigars and cigarettes. He started smoking about 72 years ago. He has a 30.00 pack-year smoking history. He has  never used smokeless tobacco. Gary Odonnell reports no history of alcohol use.   Review of Systems CONSTITUTIONAL: No weight loss, fever, chills, weakness or fatigue.  HEENT: Eyes: No visual loss, blurred vision, double vision or yellow sclerae.No hearing loss, sneezing, congestion, runny nose or sore throat.  SKIN: No rash or itching.  CARDIOVASCULAR: per hpi RESPIRATORY: No shortness of breath, cough or sputum.  GASTROINTESTINAL: No anorexia, nausea, vomiting or diarrhea. No abdominal pain or blood.  GENITOURINARY: No burning on urination, no polyuria NEUROLOGICAL: No headache, dizziness, syncope, paralysis, ataxia, numbness or tingling in the extremities. No change in bowel or bladder control.  MUSCULOSKELETAL: No muscle, back pain, joint pain or stiffness.  LYMPHATICS: No enlarged nodes. No history of splenectomy.  PSYCHIATRIC: No history of depression or anxiety.  ENDOCRINOLOGIC: No reports of sweating, cold or heat intolerance. No polyuria or polydipsia.  Marland Kitchen   Physical Examination Today's Vitals   12/03/21 0948  BP: 140/60  Pulse: 84  SpO2: 96%  Weight: 169 lb 12.8 oz (77 kg)  Height: 5\' 11"  (1.803 m)   Body mass index is 23.68 kg/m.  Gen: resting comfortably, no acute distress HEENT: no scleral icterus, pupils equal round and reactive, no palptable cervical adenopathy,  CV: irreg, no mrg, no jvd Resp: Clear to auscultation bilaterally GI: abdomen is soft, non-tender, non-distended, normal bowel sounds, no hepatosplenomegaly MSK: extremities are warm, no edema.  Skin: warm, no rash Neuro:  no focal deficits Psych: appropriate affect   Diagnostic Studies 09/29/13 Cath   Hemodynamic Findings:   Ao: 162/70   LV: 163/19/29   RA: 12  RV: 66/7/16   PA: 67/26 (mean 43)   PCWP: 22   Fick Cardiac Output: 3.5 L/min   Fick Cardiac Index: 1.74 L/min/m2   Central Aortic Saturation: 91%   Pulmonary Artery Saturation: 50%   Angiographic Findings:   Left main: Distal 30%  stenosis.   Left Anterior Descending Artery: 100% proximal occlusion. Proximal, mid and  distal vessel fills from the patent IMA graft.   Circumflex Artery: Large caliber vessel with large intermediate Paige Monarrez. Diffuse non-obstructive plaque in the large bifurcating intermediate Sumner Boesch. The ostium of the Circumflex has 50% stenosis.   Right Coronary Artery: Large dominant vessel with 100% mid occlusion. The distal vessel fills right to right bridging collaterals and left to right collaterals.   Graft Anatomy:  LIMA to LAD is patent   Free Radial (? Y graft from LIMA) to OM1, OM2, PDA is occluded.   Left Ventricular Angiogram: Deferred.   Impression:   1. Triple vessel CAD with patent LIMA to LAD, occluded RCA with good collateral filling, moderate disease in Circumflex   2. No focal targets for PCI   3. Ischemic cardiomyopathy with acute systolic CHF     86/5/78 Echo   LVEF 15-20%, moderate LVH, severe diffuse hypokinesis, restrictive diastolic dysfunction, multiple WMAs, moderate MR, severe RV dysfunction RV TAPSE 0.7 cm, PASP 61 mmHg.     12/2013 Lab   TC 175 TG 111 LDL 116 HDL 37   TSH 7.7 HgbA1c 6.6 Na 143 K 4.5 Cr 1.22 GFR56    04/2014 Echo Study Conclusions  - Left ventricle: The cavity size was normal. Wall thickness was increased in a pattern of mild LVH. Systolic function was mildly to moderately reduced. The estimated ejection fraction was in the range of 40% to 45%. There is diastolic dysfunction, indeterminant grade. - Regional wall motion abnormality: Hypokinesis of the mid anterior and basal-mid anteroseptal myocardium. - Aortic valve: Moderately calcified annulus. Trileaflet; moderately thickened leaflets. Valve area (VTI): 2.48 cm^2. Valve area (Vmax): 2.23 cm^2. - Mitral valve: Moderately calcified annulus. Mildly thickened leaflets . - Left atrium: The atrium was moderately dilated. - Right atrium: The atrium was mildly dilated. - Technically adequate study.      01/2015 Carotid US bilaterall 1-39%       01/2018 echo Study Conclusions   - Left ventricle: The cavity size was normal. Wall thickness was   increased in a pattern of moderate LVH. Systolic function was   moderately reduced. The estimated ejection fraction was in the   range of 35% to 40%. Diffuse hypokinesis. Features are consistent   with a pseudonormal left ventricular filling pattern, with   concomitant abnormal relaxation and increased filling pressure   (grade 2 diastolic dysfunction). Doppler parameters are   consistent with high ventricular filling pressure. - Regional wall motion abnormality: Moderate hypokinesis of the   basal-mid anteroseptal, mid inferoseptal, and mid inferior   myocardium. - Aortic valve: Moderately calcified annulus. Mildly thickened,   mildly calcified leaflets. There appears to be at least mild (if   not mild to moderate) calcific aortic valvular stenosis. I   suspect peak velocity and mean gradient is underestimated due to   depressed LV function. Peak velocity (S): 159 cm/s. Mean gradient   (S): 6 mm Hg. Valve area (VTI): 1.66 cm^2. Valve area (Vmax):   1.73 cm^2. Valve area (Vmean): 1.52 cm^2. - Mitral valve: Calcified annulus. Restricted leaflet motion due to   depressed LV function. There was mild regurgitation. Valve area   by continuity equation (using LVOT flow): 1.21 cm^2. - Left atrium: The atrium was mildly dilated. - Right ventricle: Systolic function was moderately reduced. - Tricuspid valve: There was mild regurgitation   03/2020 cath Prox RCA lesion is 100% stenosed. Prox LAD lesion is 100% stenosed. LIMA graft was visualized by angiography and is normal in caliber. Dist Cx lesion is 90%  stenosed. Ost Cx to Prox Cx lesion is 70% stenosed. Lat Ramus lesion is 70% stenosed. Ramus lesion is 70% stenosed.   1. Severe triple vessel CAD s/p 4V CABG with 1/4 patent grafts.  2. Occluded proximal LAD. Patent LIMA to LAD 3. Moderately  severe proximal Circumflex stenosis, unchanged from last cath. Severe distal Circumflex disease. Too small for PCI.  4. Moderately severe intermediate disease, grossly unchanged since last cath  5. Chronically occluded proximal RCA. The distal vessel fills from left to right collaterals.  6. Normal filling pressures   Recommendations: He has moderately severe disease in the intermediate Khori Rosevear and the Circumflex. His LAD is chronically occluded but the LIMA graft to the mid LAD is patent. The RCA is chronically occluded and fills from left to right collaterals. There has been progression of his disease in the intermediate and Circumflex, however, in the absence of angina, I would continue medical management of his CAD at this time.     09/2021 carotid US Right Carotid: Velocities in the right ICA are consistent with a 40-59%                 stenosis. Non-hemodynamically significant plaque <50% noted  in                 the CCA.   Left Carotid: Velocities in the left ICA are consistent with a 1-39%  stenosis.                Hemodynamically significant plaque >50% visualized in the  CCA.                Significant stenosis in the mid CCA, with dampened flow                downstream.   Vertebrals:  Bilateral vertebral arteries demonstrate antegrade flow.  Subclavians: Normal flow hemodynamics were seen in bilateral subclavian               arteries.   Assessment and Plan  1. CAD/Chronic systolic HF - have continued ASA along with his eliquis due to extensive history of CAD, cath last year with NSTEMI severe 3 vessel disease 1/4 patent grafts - diarrhea on aldactone, stopped. He was not interested in starting jardiance - appears euvolemic today, continue current meds.    2. Afib - no symptoms, continue current meds     3. HTN -manual recheck 124/70, continue current meds   4. Hyperlipidemia - statin intolerant, has refused pcsk9-Is - continue zetia -remains not interested in  pcsk9i, continue current meds  Encouraged him to discuss weight loss with pcp  F/u 6 months   Arnoldo Lenis, M.D.

## 2021-12-03 NOTE — Patient Instructions (Addendum)
Medication Instructions:  Vania Rea removed from list today. Continue all other medications.     Labwork: BMET, Mg - orders given today Please do in 2 weeks (around 12/17/2021) Office will contact with results via phone or letter.     Testing/Procedures: none  Follow-Up: 6 months   Any Other Special Instructions Will Be Listed Below (If Applicable).   If you need a refill on your cardiac medications before your next appointment, please call your pharmacy.

## 2021-12-18 ENCOUNTER — Other Ambulatory Visit: Payer: Self-pay

## 2021-12-18 ENCOUNTER — Other Ambulatory Visit (HOSPITAL_COMMUNITY)
Admission: RE | Admit: 2021-12-18 | Discharge: 2021-12-18 | Disposition: A | Discharge status: Home / Self Care | Payer: Medicare Other | Source: Ambulatory Visit | Attending: Cardiology | Admitting: Cardiology

## 2021-12-18 DIAGNOSIS — I10 Essential (primary) hypertension: Secondary | ICD-10-CM | POA: Insufficient documentation

## 2021-12-18 DIAGNOSIS — I5022 Chronic systolic (congestive) heart failure: Secondary | ICD-10-CM | POA: Diagnosis not present

## 2021-12-18 LAB — BASIC METABOLIC PANEL
Anion gap: 10 (ref 5–15)
BUN: 20 mg/dL (ref 8–23)
CO2: 28 mmol/L (ref 22–32)
Calcium: 9.5 mg/dL (ref 8.9–10.3)
Chloride: 101 mmol/L (ref 98–111)
Creatinine, Ser: 1.48 mg/dL — ABNORMAL HIGH (ref 0.61–1.24)
GFR, Estimated: 46 mL/min — ABNORMAL LOW (ref >60)
Glucose, Bld: 137 mg/dL — ABNORMAL HIGH (ref 70–99)
Potassium: 3.7 mmol/L (ref 3.5–5.1)
Sodium: 139 mmol/L (ref 135–145)

## 2021-12-18 LAB — MAGNESIUM: Magnesium: 2 mg/dL (ref 1.7–2.4)

## 2022-01-04 ENCOUNTER — Encounter (INDEPENDENT_AMBULATORY_CARE_PROVIDER_SITE_OTHER): Payer: Medicare Other | Admitting: Ophthalmology

## 2022-01-09 ENCOUNTER — Telehealth: Payer: Self-pay | Admitting: Cardiology

## 2022-01-09 MED ORDER — APIXABAN 2.5 MG PO TABS: 2.5000 mg | ORAL_TABLET | Freq: Two times a day (BID) | ORAL | 5 refills | Status: AC

## 2022-01-09 NOTE — Telephone Encounter (Signed)
°*  STAT* If patient is at the pharmacy, call can be transferred to refill team.   1. Which medications need to be refilled? (please list name of each medication and dose if known)  apixaban (ELIQUIS) 2.5 MG TABS tablet  2. Which pharmacy/location (including street and city if local pharmacy) is medication to be sent to? [facility] [place], [place]  3. Do they need a 30 day or 90 day supply?  90 day supply

## 2022-01-09 NOTE — Telephone Encounter (Signed)
Prescription refill request for Eliquis received. Indication: Atrial fib Last office visit: 12/03/21  Zandra Abts MD Scr: 1.48 on 12/18/21 Age: 88 Weight: 77kg  Based on above findings with SCr close to 1.5, age and current weight loss will continue Eliquis 2.5mg  twice daily.  Refill approved.

## 2022-02-08 ENCOUNTER — Other Ambulatory Visit: Payer: Self-pay

## 2022-02-08 ENCOUNTER — Emergency Department (HOSPITAL_COMMUNITY)
Admission: EM | Admit: 2022-02-08 | Discharge: 2022-02-09 | Disposition: A | Discharge status: Home / Self Care | Payer: Medicare Other | Attending: Emergency Medicine | Admitting: Emergency Medicine

## 2022-02-08 ENCOUNTER — Emergency Department (HOSPITAL_COMMUNITY): Payer: Medicare Other

## 2022-02-08 DIAGNOSIS — Z7951 Long term (current) use of inhaled steroids: Secondary | ICD-10-CM | POA: Insufficient documentation

## 2022-02-08 DIAGNOSIS — Z79899 Other long term (current) drug therapy: Secondary | ICD-10-CM | POA: Insufficient documentation

## 2022-02-08 DIAGNOSIS — R0602 Shortness of breath: Secondary | ICD-10-CM | POA: Diagnosis not present

## 2022-02-08 DIAGNOSIS — D649 Anemia, unspecified: Secondary | ICD-10-CM | POA: Diagnosis not present

## 2022-02-08 DIAGNOSIS — Z7982 Long term (current) use of aspirin: Secondary | ICD-10-CM | POA: Diagnosis not present

## 2022-02-08 DIAGNOSIS — J441 Chronic obstructive pulmonary disease with (acute) exacerbation: Secondary | ICD-10-CM | POA: Diagnosis not present

## 2022-02-08 DIAGNOSIS — I130 Hypertensive heart and chronic kidney disease with heart failure and stage 1 through stage 4 chronic kidney disease, or unspecified chronic kidney disease: Secondary | ICD-10-CM | POA: Insufficient documentation

## 2022-02-08 DIAGNOSIS — N289 Disorder of kidney and ureter, unspecified: Secondary | ICD-10-CM

## 2022-02-08 DIAGNOSIS — J449 Chronic obstructive pulmonary disease, unspecified: Secondary | ICD-10-CM | POA: Diagnosis not present

## 2022-02-08 DIAGNOSIS — N189 Chronic kidney disease, unspecified: Secondary | ICD-10-CM | POA: Diagnosis not present

## 2022-02-08 DIAGNOSIS — I4891 Unspecified atrial fibrillation: Secondary | ICD-10-CM | POA: Diagnosis not present

## 2022-02-08 DIAGNOSIS — E119 Type 2 diabetes mellitus without complications: Secondary | ICD-10-CM | POA: Diagnosis not present

## 2022-02-08 DIAGNOSIS — I110 Hypertensive heart disease with heart failure: Secondary | ICD-10-CM | POA: Diagnosis not present

## 2022-02-08 DIAGNOSIS — Z7901 Long term (current) use of anticoagulants: Secondary | ICD-10-CM | POA: Diagnosis not present

## 2022-02-08 DIAGNOSIS — Z20822 Contact with and (suspected) exposure to covid-19: Secondary | ICD-10-CM | POA: Diagnosis not present

## 2022-02-08 DIAGNOSIS — I5043 Acute on chronic combined systolic (congestive) and diastolic (congestive) heart failure: Secondary | ICD-10-CM | POA: Diagnosis not present

## 2022-02-08 LAB — BASIC METABOLIC PANEL
Anion gap: 10 (ref 5–15)
BUN: 24 mg/dL — ABNORMAL HIGH (ref 8–23)
CO2: 29 mmol/L (ref 22–32)
Calcium: 9.7 mg/dL (ref 8.9–10.3)
Chloride: 104 mmol/L (ref 98–111)
Creatinine, Ser: 1.47 mg/dL — ABNORMAL HIGH (ref 0.61–1.24)
GFR, Estimated: 46 mL/min — ABNORMAL LOW (ref >60)
Glucose, Bld: 126 mg/dL — ABNORMAL HIGH (ref 70–99)
Potassium: 5.1 mmol/L (ref 3.5–5.1)
Sodium: 143 mmol/L (ref 135–145)

## 2022-02-08 LAB — CBC
HCT: 36.3 % — ABNORMAL LOW (ref 39.0–52.0)
Hemoglobin: 10.9 g/dL — ABNORMAL LOW (ref 13.0–17.0)
MCH: 29 pg (ref 26.0–34.0)
MCHC: 30 g/dL (ref 30.0–36.0)
MCV: 96.5 fL (ref 80.0–100.0)
Platelets: 151 10*3/uL (ref 150–400)
RBC: 3.76 MIL/uL — ABNORMAL LOW (ref 4.22–5.81)
RDW: 15.4 % (ref 11.5–15.5)
WBC: 8.3 10*3/uL (ref 4.0–10.5)
nRBC: 0 % (ref 0.0–0.2)

## 2022-02-08 LAB — BRAIN NATRIURETIC PEPTIDE: B Natriuretic Peptide: 2340 pg/mL — ABNORMAL HIGH (ref 0.0–100.0)

## 2022-02-08 MED ORDER — IPRATROPIUM-ALBUTEROL 0.5-2.5 (3) MG/3ML IN SOLN
3.0000 mL | Freq: Once | RESPIRATORY_TRACT | Status: AC
  Administered 2022-02-09: 3 mL via RESPIRATORY_TRACT
  Filled 2022-02-08: qty 3

## 2022-02-08 MED ORDER — PREDNISONE 50 MG PO TABS
60.0000 mg | ORAL_TABLET | Freq: Once | ORAL | Status: AC
  Administered 2022-02-08: 60 mg via ORAL
  Filled 2022-02-08: qty 1

## 2022-02-08 NOTE — ED Provider Notes (Signed)
?[facility] ?Provider Note ? ? ?CSN: [id number] ?Arrival date & time: 02/08/22  1859 ? ?  ? ?History ? ?Chief Complaint  ?Patient presents with  ? Shortness of Breath  ?  CHF; COPD  ? ? ?Gary Odonnell is a 88 y.o. male. ? ?The history is provided by the patient.  ?Shortness of Breath ?He has history of hypertension, diabetes, hyperlipidemia, chronic kidney disease, COPD, atrial fibrillation anticoagulated on apixaban, combined systolic and diastolic heart failure and comes in complaining of shortness of breath which has been worse over the last 3 days.  He denies fever, chills, sweats.  There has been a cough productive of some mucus, but he does not know the color.  He denies chest pain, heaviness, tightness, pressure.  Dyspnea is worse with exertion but not with laying supine.  He denies nausea or vomiting.  He has been having intermittent diarrhea for several months, and that has not changed.  He has been using his home nebulizer and inhaler which gives slight, temporary relief. ?  ?Home Medications ?Prior to Admission medications   ?Medication Sig Start Date End Date Taking? Authorizing Provider  ?acetaminophen (TYLENOL) 325 MG tablet Take 2 tablets (650 mg total) by mouth every 6 (six) hours as needed for mild pain or headache (or Fever >/= 101). 09/06/21   Roxan Hockey, MD  ?albuterol (PROVENTIL) (2.5 MG/3ML) 0.083% nebulizer solution Take 3 mLs (2.5 mg total) by nebulization every 6 (six) hours as needed for shortness of breath. 10/08/20   Kathie Dike, MD  ?albuterol (VENTOLIN HFA) 108 (90 Base) MCG/ACT inhaler Inhale 2 puffs into the lungs every 6 (six) hours as needed for wheezing or shortness of breath. 04/11/20   Debbe Odea, MD  ?apixaban (ELIQUIS) 2.5 MG TABS tablet Take 1 tablet (2.5 mg total) by mouth 2 (two) times daily. 01/09/22   Arnoldo Lenis, MD  ?aspirin 81 MG EC tablet Take 1 tablet (81 mg total) by mouth daily with breakfast. 09/06/21   Roxan Hockey, MD   ?carvedilol (COREG) 6.25 MG tablet Take 1 tablet (6.25 mg total) by mouth 2 (two) times daily with a meal. 09/06/21   Emokpae, Courage, MD  ?ezetimibe (ZETIA) 10 MG tablet Take 1 tablet (10 mg total) by mouth daily. 05/31/21   Arnoldo Lenis, MD  ?furosemide (LASIX) 40 MG tablet Take 1 tablet (40 mg total) by mouth daily. (MAY TAKE AN ADDITIONAL 1/2 TAB AS NEEDED FOR SWELLING) - 09/06/21   Roxan Hockey, MD  ?hydrALAZINE (APRESOLINE) 25 MG tablet TAKE 1 AND 1/2 TABLETS(37.5 MG) BY MOUTH THREE TIMES DAILY 10/05/21   Arnoldo Lenis, MD  ?nitroGLYCERIN (NITROSTAT) 0.3 MG SL tablet Place 0.3 mg under the tongue every 5 (five) minutes as needed for chest pain.    [provider]  ?Potassium 99 MG TABS Take 1 tablet by mouth daily.     [provider]  ?predniSONE (DELTASONE) 50 MG tablet Take 1 tablet (50 mg total) by mouth daily with breakfast. 09/07/21   Emokpae, Courage, MD  ?sacubitril-valsartan (ENTRESTO) 49-51 MG Take 1 tablet by mouth 2 (two) times daily. 09/06/21   Roxan Hockey, MD  ?umeclidinium-vilanterol (ANORO ELLIPTA) 62.5-25 MCG/INH AEPB Inhale 1 puff into the lungs daily. 09/07/21   Roxan Hockey, MD  ?   ? ?Allergies    ?Statins and Aldactone [spironolactone]   ? ?Review of Systems   ?Review of Systems  ?Respiratory:  Positive for shortness of breath.   ?All other systems reviewed  and are negative. ? ?Physical Exam ?Updated Vital Signs ?BP 131/63   Pulse 75   Temp 97.9 ?F (36.6 ?C) (Oral)   Resp 17   Ht 5\' 11"  (1.803 m)   Wt 77 kg   SpO2 91%   BMI 23.68 kg/m?  ?Physical Exam ?Vitals and nursing note reviewed.  ?88 year old male, resting comfortably and in no acute distress. Vital signs are normal. Oxygen saturation is 91%, which is normal. ?Head is normocephalic and atraumatic. PERRLA, EOMI. Oropharynx is clear. ?Neck is nontender and supple without adenopathy or JVD. ?Back is nontender and there is no CVA tenderness. ?Lungs have coarse expiratory wheezes without  rales or rhonchi. ?Chest is nontender. ?Heart has an irregular rhythm without murmur. ?Abdomen is soft, flat, nontender. ?Extremities have trace edema, full range of motion is present. ?Skin is warm and dry without rash. ?Neurologic: Mental status is normal, cranial nerves are intact, moves all extremities equally. ? ?ED Results / Procedures / Treatments   ?Labs ?(all labs ordered are listed, but only abnormal results are displayed) ?Labs Reviewed  ?BRAIN NATRIURETIC PEPTIDE - Abnormal; Notable for the following components:  ?    Result Value  ? B Natriuretic Peptide 2,340.0 (*)   ? All other components within normal limits  ?CBC - Abnormal; Notable for the following components:  ? RBC 3.76 (*)   ? Hemoglobin 10.9 (*)   ? HCT 36.3 (*)   ? All other components within normal limits  ?BASIC METABOLIC PANEL - Abnormal; Notable for the following components:  ? Glucose, Bld 126 (*)   ? BUN 24 (*)   ? Creatinine, Ser 1.47 (*)   ? GFR, Estimated 46 (*)   ? All other components within normal limits  ? ? ?Radiology ?DG Chest Port 1 View ? ?Result Date: 02/08/2022 ?CLINICAL DATA:  Shortness of breath and congestion. History of COPD and CHF. EXAM: PORTABLE CHEST 1 VIEW COMPARISON:  09/04/2021 FINDINGS: Patient is post median sternotomy. Borderline cardiomegaly with stable mediastinal contours. Small left pleural effusion and adjacent basilar opacity, likely atelectasis. Trace right pleural effusion. There is mild chronic bronchial thickening. No pneumothorax. No acute osseous findings. IMPRESSION: 1. Small left pleural effusion and adjacent basilar opacity, likely atelectasis. Trace right pleural effusion. 2. Chronic bronchial thickening likely related to COPD. Electronically Signed   By: Keith Rake M.D.   On: 02/08/2022 20:06   ? ?Procedures ?Procedures  ?Cardiac monitor shows atrial fibrillation with occasional PVC, per my interpretation. ? ?Medications Ordered in ED ?Medications  ?ipratropium-albuterol (DUONEB) 0.5-2.5  (3) MG/3ML nebulizer solution 3 mL (has no administration in time range)  ?predniSONE (DELTASONE) tablet 60 mg (has no administration in time range)  ? ? ?ED Course/ Medical Decision Making/ A&P ?  ?                        ?Medical Decision Making ?Amount and/or Complexity of Data Reviewed ?Labs: ordered. ? ?Risk ?Prescription drug management. ? ? ?Shortness of breath and cough which appears to be a COPD exacerbation.  Possible component of heart failure.  No fever to suggest pneumonia.  Currently anticoagulated, therefore doubt pulmonary embolism.  Chest x-ray shows changes of COPD and a small pleural effusion, no pulmonary vascular congestion.  I have independently viewed the image, and agree with the radiologist's interpretation.  Labs show stable renal insufficiency, stable anemia BNP is significantly elevated and is higher than recent values, suggesting some component of heart failure.  Old records are reviewed, and he did have a hospitalization on 09/04/2021 for respiratory failure that was felt to be combined heart failure and COPD.  He will be given nebulizer treatment albuterol and ipratropium, oral prednisone. ? ?Following nebulizer treatment, patient felt that he was doing a little better.  There was slightly improved airflow, but still considerable wheezing.  Second nebulizer treatment is ordered. ? ?Following separate nebulizer treatment, patient is feeling considerably better.  On exam, there is only minimal wheezing.  He is discharged with prescription for 5-day course of prednisone.  Also advised to increase his furosemide dose for the next 3 days.  Follow-up with PCP.  Return precautions discussed. ? ?Final Clinical Impression(s) / ED Diagnoses ?Final diagnoses:  ?COPD exacerbation ([place])  ?Acute on chronic combined systolic and diastolic heart failure ([place])  ?Normochromic normocytic anemia  ?Renal insufficiency  ?Chronic anticoagulation  ? ? ?Rx / DC Orders ?ED Discharge Orders   ? ?      Ordered  ?   predniSONE (DELTASONE) 50 MG tablet  Daily with breakfast       ? 02/09/22 0236  ? ?  ?  ? ?  ? ? ?  ?Delora Fuel, MD ?36/62/94 [phone] ? ?

## 2022-02-08 NOTE — ED Triage Notes (Addendum)
POV from home. Cc of SOB for a couple days.  ?Has not called pcp. Said his PCP is "not worth a darn" sees Dr Harl Bowie. But did not call him.  ?Does not wear oxygen at home. Was told to be on oxygen 4-5 years ago. But then gave it away when covid started and now needs it again.  ? ?Hx of COPD and CHF.  ? ?93% on room air  ?Placed on 1l Early  ?

## 2022-02-09 DIAGNOSIS — J441 Chronic obstructive pulmonary disease with (acute) exacerbation: Secondary | ICD-10-CM | POA: Diagnosis not present

## 2022-02-09 LAB — RESP PANEL BY RT-PCR (FLU A&B, COVID) ARPGX2
Influenza A by PCR: NEGATIVE
Influenza B by PCR: NEGATIVE
SARS Coronavirus 2 by RT PCR: NEGATIVE

## 2022-02-09 MED ORDER — IPRATROPIUM-ALBUTEROL 0.5-2.5 (3) MG/3ML IN SOLN
3.0000 mL | Freq: Once | RESPIRATORY_TRACT | Status: AC
  Administered 2022-02-09: 3 mL via RESPIRATORY_TRACT
  Filled 2022-02-09: qty 3

## 2022-02-09 MED ORDER — PREDNISONE 50 MG PO TABS: 50.0000 mg | ORAL_TABLET | Freq: Every day | ORAL | 0 refills | Status: AC

## 2022-02-09 NOTE — Discharge Instructions (Signed)
Continue using your nebulizer and inhalers at home, as needed. ? ?Please take 2 tablets (80 mg) of furosemide once a day for the next 3 days.  After that, resume your normal dose. ? ?Return if symptoms are getting worse. ?

## 2022-02-11 ENCOUNTER — Telehealth: Payer: Self-pay | Admitting: Family Medicine

## 2022-02-11 ENCOUNTER — Telehealth: Payer: Self-pay | Admitting: Cardiology

## 2022-02-11 NOTE — Telephone Encounter (Signed)
Mrs. Reason waled into the office stating that her husband was seen in the ER at [facility] over the weekend.  She states that the ER doctor told patient that he needs to be on oxygen but no order given. ? ?PCP   Hendricks Limes  ?

## 2022-02-11 NOTE — Telephone Encounter (Signed)
Advised that oxygen orders would need to be requested from PCP or pulmonologist ?Verbalized understanding ?

## 2022-02-11 NOTE — Telephone Encounter (Signed)
Spoke with patient's wife and offered an appointment tomorrow or Wednesday.  Patient is refusing appointment anytime, he does not want to be seen.  I advised his wife to call us back if anything changed and he will agree to be seen. ?

## 2022-02-26 ENCOUNTER — Inpatient Hospital Stay (HOSPITAL_COMMUNITY): Payer: Medicare Other

## 2022-02-26 ENCOUNTER — Inpatient Hospital Stay (HOSPITAL_COMMUNITY)
Admission: EM | Admit: 2022-02-26 | Discharge: 2022-03-25 | DRG: 871 | Disposition: E | Payer: Medicare Other | Attending: Internal Medicine | Admitting: Internal Medicine

## 2022-02-26 ENCOUNTER — Emergency Department (HOSPITAL_COMMUNITY): Payer: Medicare Other

## 2022-02-26 ENCOUNTER — Encounter (HOSPITAL_COMMUNITY): Payer: Self-pay | Admitting: Emergency Medicine

## 2022-02-26 ENCOUNTER — Other Ambulatory Visit: Payer: Self-pay

## 2022-02-26 DIAGNOSIS — R778 Other specified abnormalities of plasma proteins: Secondary | ICD-10-CM | POA: Diagnosis not present

## 2022-02-26 DIAGNOSIS — Z515 Encounter for palliative care: Secondary | ICD-10-CM | POA: Diagnosis not present

## 2022-02-26 DIAGNOSIS — R197 Diarrhea, unspecified: Secondary | ICD-10-CM | POA: Diagnosis not present

## 2022-02-26 DIAGNOSIS — I350 Nonrheumatic aortic (valve) stenosis: Secondary | ICD-10-CM | POA: Diagnosis not present

## 2022-02-26 DIAGNOSIS — Z85118 Personal history of other malignant neoplasm of bronchus and lung: Secondary | ICD-10-CM

## 2022-02-26 DIAGNOSIS — I5023 Acute on chronic systolic (congestive) heart failure: Secondary | ICD-10-CM | POA: Diagnosis present

## 2022-02-26 DIAGNOSIS — R6521 Severe sepsis with septic shock: Secondary | ICD-10-CM | POA: Diagnosis present

## 2022-02-26 DIAGNOSIS — N390 Urinary tract infection, site not specified: Secondary | ICD-10-CM | POA: Diagnosis present

## 2022-02-26 DIAGNOSIS — E1122 Type 2 diabetes mellitus with diabetic chronic kidney disease: Secondary | ICD-10-CM | POA: Diagnosis not present

## 2022-02-26 DIAGNOSIS — Z7952 Long term (current) use of systemic steroids: Secondary | ICD-10-CM

## 2022-02-26 DIAGNOSIS — I130 Hypertensive heart and chronic kidney disease with heart failure and stage 1 through stage 4 chronic kidney disease, or unspecified chronic kidney disease: Secondary | ICD-10-CM | POA: Diagnosis not present

## 2022-02-26 DIAGNOSIS — E1151 Type 2 diabetes mellitus with diabetic peripheral angiopathy without gangrene: Secondary | ICD-10-CM | POA: Diagnosis present

## 2022-02-26 DIAGNOSIS — I248 Other forms of acute ischemic heart disease: Secondary | ICD-10-CM | POA: Diagnosis present

## 2022-02-26 DIAGNOSIS — Z923 Personal history of irradiation: Secondary | ICD-10-CM

## 2022-02-26 DIAGNOSIS — I4819 Other persistent atrial fibrillation: Secondary | ICD-10-CM | POA: Diagnosis not present

## 2022-02-26 DIAGNOSIS — I447 Left bundle-branch block, unspecified: Secondary | ICD-10-CM | POA: Diagnosis present

## 2022-02-26 DIAGNOSIS — L89151 Pressure ulcer of sacral region, stage 1: Secondary | ICD-10-CM | POA: Diagnosis present

## 2022-02-26 DIAGNOSIS — N179 Acute kidney failure, unspecified: Secondary | ICD-10-CM | POA: Diagnosis not present

## 2022-02-26 DIAGNOSIS — R531 Weakness: Secondary | ICD-10-CM | POA: Diagnosis not present

## 2022-02-26 DIAGNOSIS — N281 Cyst of kidney, acquired: Secondary | ICD-10-CM | POA: Diagnosis not present

## 2022-02-26 DIAGNOSIS — F17210 Nicotine dependence, cigarettes, uncomplicated: Secondary | ICD-10-CM | POA: Diagnosis present

## 2022-02-26 DIAGNOSIS — R188 Other ascites: Secondary | ICD-10-CM | POA: Diagnosis present

## 2022-02-26 DIAGNOSIS — I10 Essential (primary) hypertension: Secondary | ICD-10-CM | POA: Diagnosis not present

## 2022-02-26 DIAGNOSIS — A419 Sepsis, unspecified organism: Principal | ICD-10-CM | POA: Diagnosis present

## 2022-02-26 DIAGNOSIS — Z833 Family history of diabetes mellitus: Secondary | ICD-10-CM

## 2022-02-26 DIAGNOSIS — J449 Chronic obstructive pulmonary disease, unspecified: Secondary | ICD-10-CM | POA: Diagnosis not present

## 2022-02-26 DIAGNOSIS — I340 Nonrheumatic mitral (valve) insufficiency: Secondary | ICD-10-CM | POA: Diagnosis not present

## 2022-02-26 DIAGNOSIS — K7290 Hepatic failure, unspecified without coma: Secondary | ICD-10-CM | POA: Diagnosis present

## 2022-02-26 DIAGNOSIS — E872 Acidosis, unspecified: Secondary | ICD-10-CM | POA: Diagnosis present

## 2022-02-26 DIAGNOSIS — R748 Abnormal levels of other serum enzymes: Secondary | ICD-10-CM | POA: Diagnosis not present

## 2022-02-26 DIAGNOSIS — Z79899 Other long term (current) drug therapy: Secondary | ICD-10-CM

## 2022-02-26 DIAGNOSIS — N3000 Acute cystitis without hematuria: Secondary | ICD-10-CM

## 2022-02-26 DIAGNOSIS — Z66 Do not resuscitate: Secondary | ICD-10-CM | POA: Diagnosis present

## 2022-02-26 DIAGNOSIS — R0602 Shortness of breath: Secondary | ICD-10-CM | POA: Diagnosis not present

## 2022-02-26 DIAGNOSIS — Z83438 Family history of other disorder of lipoprotein metabolism and other lipidemia: Secondary | ICD-10-CM

## 2022-02-26 DIAGNOSIS — N1832 Chronic kidney disease, stage 3b: Secondary | ICD-10-CM | POA: Diagnosis present

## 2022-02-26 DIAGNOSIS — K8020 Calculus of gallbladder without cholecystitis without obstruction: Secondary | ICD-10-CM | POA: Diagnosis not present

## 2022-02-26 DIAGNOSIS — Z720 Tobacco use: Secondary | ICD-10-CM | POA: Diagnosis present

## 2022-02-26 DIAGNOSIS — Z951 Presence of aortocoronary bypass graft: Secondary | ICD-10-CM

## 2022-02-26 DIAGNOSIS — I472 Ventricular tachycardia, unspecified: Secondary | ICD-10-CM | POA: Diagnosis not present

## 2022-02-26 DIAGNOSIS — J90 Pleural effusion, not elsewhere classified: Secondary | ICD-10-CM | POA: Diagnosis not present

## 2022-02-26 DIAGNOSIS — Z8673 Personal history of transient ischemic attack (TIA), and cerebral infarction without residual deficits: Secondary | ICD-10-CM

## 2022-02-26 DIAGNOSIS — I739 Peripheral vascular disease, unspecified: Secondary | ICD-10-CM | POA: Diagnosis present

## 2022-02-26 DIAGNOSIS — I159 Secondary hypertension, unspecified: Secondary | ICD-10-CM

## 2022-02-26 DIAGNOSIS — I255 Ischemic cardiomyopathy: Secondary | ICD-10-CM | POA: Diagnosis present

## 2022-02-26 DIAGNOSIS — R54 Age-related physical debility: Secondary | ICD-10-CM | POA: Diagnosis present

## 2022-02-26 DIAGNOSIS — R6520 Severe sepsis without septic shock: Secondary | ICD-10-CM | POA: Diagnosis not present

## 2022-02-26 DIAGNOSIS — R296 Repeated falls: Secondary | ICD-10-CM | POA: Diagnosis present

## 2022-02-26 DIAGNOSIS — Z8249 Family history of ischemic heart disease and other diseases of the circulatory system: Secondary | ICD-10-CM

## 2022-02-26 DIAGNOSIS — I482 Chronic atrial fibrillation, unspecified: Secondary | ICD-10-CM

## 2022-02-26 DIAGNOSIS — S0990XA Unspecified injury of head, initial encounter: Secondary | ICD-10-CM | POA: Diagnosis not present

## 2022-02-26 DIAGNOSIS — R059 Cough, unspecified: Secondary | ICD-10-CM | POA: Diagnosis not present

## 2022-02-26 DIAGNOSIS — I361 Nonrheumatic tricuspid (valve) insufficiency: Secondary | ICD-10-CM

## 2022-02-26 DIAGNOSIS — I4821 Permanent atrial fibrillation: Secondary | ICD-10-CM | POA: Diagnosis not present

## 2022-02-26 DIAGNOSIS — G319 Degenerative disease of nervous system, unspecified: Secondary | ICD-10-CM | POA: Diagnosis not present

## 2022-02-26 DIAGNOSIS — I517 Cardiomegaly: Secondary | ICD-10-CM | POA: Diagnosis not present

## 2022-02-26 DIAGNOSIS — I5022 Chronic systolic (congestive) heart failure: Secondary | ICD-10-CM | POA: Diagnosis not present

## 2022-02-26 DIAGNOSIS — D649 Anemia, unspecified: Secondary | ICD-10-CM | POA: Diagnosis not present

## 2022-02-26 DIAGNOSIS — E119 Type 2 diabetes mellitus without complications: Secondary | ICD-10-CM

## 2022-02-26 DIAGNOSIS — W19XXXA Unspecified fall, initial encounter: Principal | ICD-10-CM

## 2022-02-26 DIAGNOSIS — I2510 Atherosclerotic heart disease of native coronary artery without angina pectoris: Secondary | ICD-10-CM | POA: Diagnosis present

## 2022-02-26 DIAGNOSIS — L8990 Pressure ulcer of unspecified site, unspecified stage: Secondary | ICD-10-CM | POA: Insufficient documentation

## 2022-02-26 DIAGNOSIS — E785 Hyperlipidemia, unspecified: Secondary | ICD-10-CM | POA: Diagnosis not present

## 2022-02-26 DIAGNOSIS — I4891 Unspecified atrial fibrillation: Secondary | ICD-10-CM | POA: Diagnosis present

## 2022-02-26 DIAGNOSIS — I252 Old myocardial infarction: Secondary | ICD-10-CM

## 2022-02-26 DIAGNOSIS — F17290 Nicotine dependence, other tobacco product, uncomplicated: Secondary | ICD-10-CM | POA: Diagnosis present

## 2022-02-26 DIAGNOSIS — Z7982 Long term (current) use of aspirin: Secondary | ICD-10-CM

## 2022-02-26 DIAGNOSIS — E1165 Type 2 diabetes mellitus with hyperglycemia: Secondary | ICD-10-CM | POA: Diagnosis present

## 2022-02-26 DIAGNOSIS — E875 Hyperkalemia: Secondary | ICD-10-CM

## 2022-02-26 DIAGNOSIS — Z7901 Long term (current) use of anticoagulants: Secondary | ICD-10-CM

## 2022-02-26 DIAGNOSIS — R7989 Other specified abnormal findings of blood chemistry: Secondary | ICD-10-CM

## 2022-02-26 DIAGNOSIS — I639 Cerebral infarction, unspecified: Secondary | ICD-10-CM | POA: Diagnosis not present

## 2022-02-26 LAB — COMPREHENSIVE METABOLIC PANEL
ALT: 171 U/L — ABNORMAL HIGH (ref 0–44)
AST: 365 U/L — ABNORMAL HIGH (ref 15–41)
Albumin: 3.5 g/dL (ref 3.5–5.0)
Alkaline Phosphatase: 128 U/L — ABNORMAL HIGH (ref 38–126)
Anion gap: 17 — ABNORMAL HIGH (ref 5–15)
BUN: 44 mg/dL — ABNORMAL HIGH (ref 8–23)
CO2: 18 mmol/L — ABNORMAL LOW (ref 22–32)
Calcium: 8.8 mg/dL — ABNORMAL LOW (ref 8.9–10.3)
Chloride: 105 mmol/L (ref 98–111)
Creatinine, Ser: 2.09 mg/dL — ABNORMAL HIGH (ref 0.61–1.24)
GFR, Estimated: 30 mL/min — ABNORMAL LOW (ref >60)
Glucose, Bld: 116 mg/dL — ABNORMAL HIGH (ref 70–99)
Potassium: 5.4 mmol/L — ABNORMAL HIGH (ref 3.5–5.1)
Sodium: 140 mmol/L (ref 135–145)
Total Bilirubin: 1.5 mg/dL — ABNORMAL HIGH (ref 0.3–1.2)
Total Protein: 6.8 g/dL (ref 6.5–8.1)

## 2022-02-26 LAB — GASTROINTESTINAL PANEL BY PCR, STOOL (REPLACES STOOL CULTURE)

## 2022-02-26 LAB — CBC WITH DIFFERENTIAL/PLATELET
Abs Immature Granulocytes: 0.03 10*3/uL (ref 0.00–0.07)
Basophils Absolute: 0 10*3/uL (ref 0.0–0.1)
Basophils Relative: 0 %
Eosinophils Absolute: 0 10*3/uL (ref 0.0–0.5)
Eosinophils Relative: 0 %
HCT: 35.8 % — ABNORMAL LOW (ref 39.0–52.0)
Hemoglobin: 10.7 g/dL — ABNORMAL LOW (ref 13.0–17.0)
Immature Granulocytes: 0 %
Lymphocytes Relative: 21 %
Lymphs Abs: 1.7 10*3/uL (ref 0.7–4.0)
MCH: 29.1 pg (ref 26.0–34.0)
MCHC: 29.9 g/dL — ABNORMAL LOW (ref 30.0–36.0)
MCV: 97.3 fL (ref 80.0–100.0)
Monocytes Absolute: 0.3 10*3/uL (ref 0.1–1.0)
Monocytes Relative: 4 %
Neutro Abs: 6.1 10*3/uL (ref 1.7–7.7)
Neutrophils Relative %: 75 %
Platelets: 129 10*3/uL — ABNORMAL LOW (ref 150–400)
RBC: 3.68 MIL/uL — ABNORMAL LOW (ref 4.22–5.81)
RDW: 16.7 % — ABNORMAL HIGH (ref 11.5–15.5)
WBC: 8.2 10*3/uL (ref 4.0–10.5)
nRBC: 0.2 % (ref 0.0–0.2)

## 2022-02-26 LAB — URINALYSIS, ROUTINE W REFLEX MICROSCOPIC
Bilirubin Urine: NEGATIVE
Glucose, UA: NEGATIVE mg/dL
Hgb urine dipstick: NEGATIVE
Ketones, ur: NEGATIVE mg/dL
Nitrite: NEGATIVE
Protein, ur: 100 mg/dL — AB
RBC / HPF: >50 RBC/hpf — ABNORMAL HIGH (ref 0–5)
Specific Gravity, Urine: 1.016 (ref 1.005–1.030)
WBC, UA: >50 WBC/hpf — ABNORMAL HIGH (ref 0–5)
pH: 5 (ref 5.0–8.0)

## 2022-02-26 LAB — LACTIC ACID, PLASMA
Lactic Acid, Venous: 5.8 mmol/L (ref 0.5–1.9)
Lactic Acid, Venous: 4.1 mmol/L (ref 0.5–1.9)
Lactic Acid, Venous: 2.8 mmol/L (ref 0.5–1.9)
Lactic Acid, Venous: 1.9 mmol/L (ref 0.5–1.9)

## 2022-02-26 LAB — ECHOCARDIOGRAM COMPLETE
AR max vel: 1.48 cm2
AV Area VTI: 1.38 cm2
AV Area mean vel: 1.4 cm2
AV Mean grad: 7.3 mmHg
AV Peak grad: 12.3 mmHg
Ao pk vel: 1.75 m/s
Area-P 1/2: 5.02 cm2
Calc EF: 35.6 %
Height: 71 in
S' Lateral: 3.9 cm
Single Plane A2C EF: 35.6 %
Single Plane A4C EF: 38.2 %
Weight: 2656.1 oz

## 2022-02-26 LAB — C DIFFICILE QUICK SCREEN W PCR REFLEX
C Diff antigen: NEGATIVE
C Diff interpretation: NOT DETECTED
C Diff toxin: NEGATIVE

## 2022-02-26 LAB — BRAIN NATRIURETIC PEPTIDE: B Natriuretic Peptide: 1448 pg/mL — ABNORMAL HIGH (ref 0.0–100.0)

## 2022-02-26 LAB — PROTIME-INR
INR: 2.3 — ABNORMAL HIGH (ref 0.8–1.2)
Prothrombin Time: 25 seconds — ABNORMAL HIGH (ref 11.4–15.2)

## 2022-02-26 LAB — MRSA NEXT GEN BY PCR, NASAL: MRSA by PCR Next Gen: NOT DETECTED

## 2022-02-26 LAB — TROPONIN I (HIGH SENSITIVITY)
Troponin I (High Sensitivity): 535 ng/L (ref <18)
Troponin I (High Sensitivity): 416 ng/L (ref <18)

## 2022-02-26 LAB — CBG MONITORING, ED: Glucose-Capillary: 105 mg/dL — ABNORMAL HIGH (ref 70–99)

## 2022-02-26 LAB — AMMONIA: Ammonia: 22 umol/L (ref 9–35)

## 2022-02-26 MED ORDER — SODIUM CHLORIDE 0.9 % IV SOLN: 500.0000 mg | INTRAVENOUS | Status: DC

## 2022-02-26 MED ORDER — ACETAMINOPHEN 325 MG PO TABS
650.0000 mg | ORAL_TABLET | Freq: Four times a day (QID) | ORAL | Status: DC | PRN
  Administered 2022-03-02: 650 mg via ORAL
  Filled 2022-02-26: qty 2

## 2022-02-26 MED ORDER — SODIUM CHLORIDE 0.9 % IV SOLN: INTRAVENOUS | Status: DC

## 2022-02-26 MED ORDER — CHLORHEXIDINE GLUCONATE CLOTH 2 % EX PADS
6.0000 | MEDICATED_PAD | Freq: Every day | CUTANEOUS | Status: DC
  Administered 2022-02-26 – 2022-02-27 (×2): 6 via TOPICAL

## 2022-02-26 MED ORDER — SODIUM CHLORIDE 0.9 % IV SOLN
500.0000 mg | INTRAVENOUS | Status: DC
  Administered 2022-02-26 – 2022-03-02 (×5): 500 mg via INTRAVENOUS
  Filled 2022-02-26 (×5): qty 5

## 2022-02-26 MED ORDER — POLYETHYLENE GLYCOL 3350 17 G PO PACK: 17.0000 g | PACK | Freq: Every day | ORAL | Status: DC | PRN

## 2022-02-26 MED ORDER — APIXABAN 2.5 MG PO TABS
2.5000 mg | ORAL_TABLET | Freq: Two times a day (BID) | ORAL | Status: DC
  Administered 2022-02-26 – 2022-03-02 (×10): 2.5 mg via ORAL
  Filled 2022-02-26 (×10): qty 1

## 2022-02-26 MED ORDER — LACTATED RINGERS IV SOLN: INTRAVENOUS | Status: DC

## 2022-02-26 MED ORDER — ONDANSETRON HCL 4 MG PO TABS: 4.0000 mg | ORAL_TABLET | Freq: Four times a day (QID) | ORAL | Status: DC | PRN

## 2022-02-26 MED ORDER — ASPIRIN EC 81 MG PO TBEC
81.0000 mg | DELAYED_RELEASE_TABLET | Freq: Every day | ORAL | Status: DC
  Administered 2022-02-26 – 2022-03-02 (×5): 81 mg via ORAL
  Filled 2022-02-26 (×7): qty 1

## 2022-02-26 MED ORDER — ONDANSETRON HCL 4 MG/2ML IJ SOLN
4.0000 mg | Freq: Four times a day (QID) | INTRAMUSCULAR | Status: DC | PRN
  Administered 2022-03-02: 4 mg via INTRAVENOUS
  Filled 2022-02-26 (×2): qty 2

## 2022-02-26 MED ORDER — ACETAMINOPHEN 650 MG RE SUPP: 650.0000 mg | Freq: Four times a day (QID) | RECTAL | Status: DC | PRN

## 2022-02-26 MED ORDER — SACUBITRIL-VALSARTAN 49-51 MG PO TABS
1.0000 | ORAL_TABLET | Freq: Two times a day (BID) | ORAL | Status: DC
  Filled 2022-02-26 (×3): qty 1

## 2022-02-26 MED ORDER — LACTATED RINGERS IV BOLUS
1000.0000 mL | Freq: Once | INTRAVENOUS | Status: AC
Start: 2022-02-26 — End: 2022-02-26
  Administered 2022-02-26: 1000 mL via INTRAVENOUS

## 2022-02-26 MED ORDER — OXYCODONE HCL 5 MG PO TABS
5.0000 mg | ORAL_TABLET | ORAL | Status: DC | PRN
  Administered 2022-02-27: 5 mg via ORAL
  Filled 2022-02-26 (×2): qty 1

## 2022-02-26 MED ORDER — ALBUTEROL SULFATE (2.5 MG/3ML) 0.083% IN NEBU
2.5000 mg | INHALATION_SOLUTION | Freq: Four times a day (QID) | RESPIRATORY_TRACT | Status: DC | PRN
  Administered 2022-02-27 – 2022-03-01 (×2): 2.5 mg via RESPIRATORY_TRACT
  Filled 2022-02-26 (×3): qty 3

## 2022-02-26 MED ORDER — ACETAMINOPHEN 500 MG PO TABS
1000.0000 mg | ORAL_TABLET | Freq: Once | ORAL | Status: AC
  Administered 2022-02-26: 1000 mg via ORAL
  Filled 2022-02-26: qty 2

## 2022-02-26 MED ORDER — FLORANEX PO PACK
1.0000 g | PACK | Freq: Three times a day (TID) | ORAL | Status: DC
  Administered 2022-02-26 – 2022-03-02 (×14): 1 g via ORAL
  Filled 2022-02-26 (×20): qty 1

## 2022-02-26 MED ORDER — UMECLIDINIUM-VILANTEROL 62.5-25 MCG/ACT IN AEPB
1.0000 | INHALATION_SPRAY | Freq: Every day | RESPIRATORY_TRACT | Status: DC
  Administered 2022-02-28 – 2022-03-03 (×4): 1 via RESPIRATORY_TRACT
  Filled 2022-02-26: qty 14

## 2022-02-26 MED ORDER — LACTATED RINGERS IV BOLUS
1000.0000 mL | Freq: Once | INTRAVENOUS | Status: AC
  Administered 2022-02-26: 1000 mL via INTRAVENOUS

## 2022-02-26 MED ORDER — EZETIMIBE 10 MG PO TABS
10.0000 mg | ORAL_TABLET | Freq: Every day | ORAL | Status: DC
  Administered 2022-02-26 – 2022-03-02 (×5): 10 mg via ORAL
  Filled 2022-02-26 (×5): qty 1

## 2022-02-26 MED ORDER — CEFTRIAXONE SODIUM 1 G IJ SOLR
1.0000 g | INTRAMUSCULAR | Status: DC
  Administered 2022-02-27 – 2022-03-03 (×5): 1 g via INTRAVENOUS
  Filled 2022-02-26 (×5): qty 10

## 2022-02-26 MED ORDER — SODIUM CHLORIDE 0.9 % IV SOLN
2.0000 g | Freq: Once | INTRAVENOUS | Status: AC
  Administered 2022-02-26: 2 g via INTRAVENOUS
  Filled 2022-02-26: qty 20

## 2022-02-26 NOTE — Assessment & Plan Note (Signed)
-   Monitor blood glucose on chemistries ?

## 2022-02-26 NOTE — Assessment & Plan Note (Signed)
-   UA indicative of UTI ?-Urine culture pending ?-Rocephin started in the ED, continue Rocephin ?-Continue to monitor ?

## 2022-02-26 NOTE — Assessment & Plan Note (Addendum)
-   Creatinine at baseline 1.47 ?Today creatinine is 2.09 >>>  ?Bicarb is 18 ?Likely secondary to sepsis ?Continue IV hydration ?Avoid nephrotoxic agents when possible ?

## 2022-02-26 NOTE — Assessment & Plan Note (Addendum)
-   Continue aspirin, Eliquis, patient is not on a statin ? ?

## 2022-02-26 NOTE — Assessment & Plan Note (Addendum)
-   Estimated GFR is usually around 45 ?-Today 30 with acute on chronic kidney disease ?-Creatinine (baseline 1.7)  2.09 >>>  ?-Avoid nephrotoxic agents when possible ?-Continue to monitor ?-Continue gentle IV fluid hydration ?

## 2022-02-26 NOTE — Assessment & Plan Note (Signed)
-   Secondary to AKI ?-Potassium 5.4 ?-Fluid bolus 1 L in the ED ?-History of combined heart failure-gentle hydration with 100 MLS per hour ?-Trend in the a.m. ?-Monitor on telemetry ?

## 2022-02-26 NOTE — Assessment & Plan Note (Addendum)
-   Continue albuterol as needed ?-Continue Anoro ?-At baseline with 3 L of oxygen, satting 98% ? ?

## 2022-02-26 NOTE — Assessment & Plan Note (Signed)
-   Continue Eliquis ?-Holding beta-blocker secondary to soft blood pressures at this time ? ?

## 2022-02-26 NOTE — Assessment & Plan Note (Signed)
-   Counseled on the importance of cessation ?-Patient is not interested in quitting ?-Not interested in nicotine patch for cravings ?

## 2022-02-26 NOTE — Progress Notes (Signed)
*  PRELIMINARY RESULTS* ?Echocardiogram ?2D Echocardiogram has been performed. ? ?Gary Odonnell ?02/24/2022, 9:33 AM ?

## 2022-02-26 NOTE — Hospital Course (Addendum)
? ?  Gary Odonnell is a 88 y.o. male with medical history significant of with history of atrial fibrillation, CHF-at baseline, COPD, CAD, HTN, HLD, PVD, CVA,  Diet-controlled DM II, presents the ED with a chief complaint of generalized weakness.  Patient reports he had generalized weakness for 2 days.  He had a fall as well.  He reports that his weakness is worse since it started.  He cannot get out of bed and he cannot get out of his chair.  He reports that he fell trying to get up.  Patient denies any pain.   ? He is on his baseline oxygen 3 L nasal cannula.  Patient reports that he has had dysuria and a significant decrease in urine output.  He also had diarrhea about 3 times in the last week.  He reports the diarrhea started 4 to days ago.  Patient reports he just does not know what is wrong, he feels so weak. ? ?Patient does not use drugs, does not drink alcohol.  He is a current smoker, half pack per day.  Patient is not interested in quitting.  Patient is not interested in a nicotine patch.  Patient is full code. ? ?-Patient was admitted with septic shock and noted to have organ dysfunction with AKI and elevated LFTs.  He was also noted to have troponin elevation and was subsequently noted to have a drop in his LVEF to 30% on 2D echocardiogram.  Patient was noted to be a very poor candidate for any invasive measures given history of CAD with prior CABG and is only noted to have 1 out of his 4 grafts patent.  He was managed conservatively by cardiology and ultimately developed worsening CHF symptoms for which she was given some Lasix, but without any improvement.  He required initiation of norepinephrine for blood pressure support and continued to have worsening multiorgan dysfunction to include elevating creatinine levels as well as liver enzymes.  Discussed case with PCCM as well as cardiology at [facility] on 4/8 and services were in agreement that patient was too high risk for transfer and had  significantly elevated mortality and should be transition to DNR and comfort measures if possible.   ? ?Discussion was then had with family members on 4/8 as well as patient who agreed to DNR and understood that he likely will not survive past a few hours to days.  Patient wanted to go home for hospice care, but could not given his hemodynamic instability, but attempts were being made to try to wean off of his norepinephrine in order to allow this possibility.  He unfortunately continued to require norepinephrine infusion overnight and the following morning on 4/9 expired at 0836. ? ?

## 2022-02-26 NOTE — Assessment & Plan Note (Addendum)
-  Patient met severe sepsis criteria with TM 100.7, RR 22, hypotensive BP 81/51, A on CKD, weighted LFTs, lactic acidosis 5.8, 4.1 ? ?-Source of infection likely UTI, possibly pneumonia ?-O2 sats read as low as 70% the patient is on his baseline oxygen supplementation maintaining mid 90s oxygen sats ? ?-Pneumonia is also on the differential with atelectasis versus developing infection on chest x-ray ?-Continue Rocephin and Zithromax started in the ED ?-Blood cultures pending ?-Urine culture pending ?-Continue to monitor ?

## 2022-02-26 NOTE — Assessment & Plan Note (Addendum)
-  Blood pressure was low on arrival 81/51, continue to improve 110/48 now  ?- Holding hydralazine and Coreg given soft blood pressures ?-Continue Entresto for heart failure ?

## 2022-02-26 NOTE — Progress Notes (Signed)
?PROGRESS NOTE ? ? ? ?Patient: Gary Odonnell                            PCP: Gary Brooklyn, FNP                    ?DOB: Jun 02, 1932            DOA: 03/22/2022 ?BLT:[id number]             DOS: 03/04/2022, 12:51 PM ? ? LOS: 0 days  ? ?Date of Service: The patient was seen and examined on 03/05/2022 ? ?Subjective:  ? ?The patient was seen and examined this morning. ?Stable at this time. ?Still complaining of :  ?Otherwise no issues overnight . ? ?Brief Narrative:  ? ? ? ?Gary Odonnell is a 88 y.o. male with medical history significant of with history of atrial fibrillation, CHF-at baseline, COPD, CAD, HTN, HLD, PVD, CVA,  Diet-controlled DM II, presents the ED with a chief complaint of generalized weakness.  Patient reports he had generalized weakness for 2 days.  He had a fall as well.  He reports that his weakness is worse since it started.  He cannot get out of bed and he cannot get out of his chair.  He reports that he fell trying to get up.  Patient denies any pain.   ? He is on his baseline oxygen 3 L nasal cannula.  Patient reports that he has had dysuria and a significant decrease in urine output.  He also had diarrhea about 3 times in the last week.  He reports the diarrhea started 4 to days ago.  Patient reports he just does not know what is wrong, he feels so weak. ? ?Patient does not use drugs, does not drink alcohol.  He is a current smoker, half pack per day.  Patient is not interested in quitting.  Patient is not interested in a nicotine patch.  Patient is full code. ? ?ED: ?Temp 98.5-100.7, heart rate 48-94, respiratory rate 18-23, blood pressure 89/64, maintaining oxygen sats on 3 L nasal cannula ?No leukocytosis White blood cell count of 8.2, hemoglobin 10.7, platelets 129 ?Chemistry reveals a slight hyperkalemia at 5.4, elevated BUN at 44, elevated creatinine at 2.09, decreased bicarb at 18 ?AST 365, ALT 171 ?Troponin 535 ?BNP is at baseline 1448 ?UA indicative of UTI ?Urine culture pending ?CT head  shows chronic left frontal infarct.  Atrophy, chronic microvascular disease.  No acute intracranial abnormality ?Chest x-ray shows right basilar opacities which may indicate developing infection or atelectasis ?INR 2.3 ? ? ? ?Assessment & Plan:  ? ?Principal Problem: ?  Sepsis ([place]) ?Active Problems: ?  Elevated troponin ?  A-fib ([place]) ?  UTI (urinary tract infection) ?  Tobacco abuse ?  HTN (hypertension) ?  PAD (peripheral artery disease) ([place]) ?  Chronic obstructive pulmonary disease (HCC) ?  AKI (acute kidney injury) ([place]) ?  Diet-controlled diabetes mellitus ([place]) ?  Stage 3b chronic kidney disease ([place]) ?  Hyperkalemia ? ? ? ? ?Assessment and Plan: ?* Sepsis ([place]) ?-Patient met severe sepsis criteria with TM 100.7, RR 22, hypotensive BP 81/51, A on CKD, weighted LFTs, lactic acidosis 5.8, 4.1 ? ?-Source of infection likely UTI, possibly pneumonia ?-O2 sats read as low as 70% the patient is on his baseline oxygen supplementation maintaining mid 90s oxygen sats ? ?-Pneumonia is also on the differential with atelectasis versus developing infection on chest  x-ray ?-Continue Rocephin and Zithromax started in the ED ?-Blood cultures pending ?-Urine culture pending ?-Continue to monitor ? ?Elevated troponin ?Initial troponin 535, repeat 416 ?Likely ischemic demand given hypotension- sepsis ?Patient is already on aspirin and Eliquis ?Continue to monitor ?Denies any chest pain, subtle changes in EKG possible chronic ?Consult cardiology for evaluation ? ?UTI (urinary tract infection) ?- UA indicative of UTI ?-Urine culture pending ?-Rocephin started in the ED, continue Rocephin ?-Continue to monitor ? ?A-fib ([place]) ?- Continue Eliquis ?-Holding beta-blocker secondary to soft blood pressures at this time ? ? ?Tobacco abuse ?- Counseled on the importance of cessation ?-Patient is not interested in quitting ?-Not interested in nicotine patch for cravings ? ?Hyperkalemia ?- Secondary to AKI ?-Potassium 5.4 ?-Fluid bolus 1 L  in the ED ?-History of combined heart failure-gentle hydration with 100 MLS per hour ?-Trend in the a.m. ?-Monitor on telemetry ? ?Stage 3b chronic kidney disease ([place]) ?- Estimated GFR is usually around 45 ?-Today 30 with acute on chronic kidney disease ?-Creatinine (baseline 1.7)  2.09 >>>  ?-Avoid nephrotoxic agents when possible ?-Continue to monitor ?-Continue gentle IV fluid hydration ? ?Diet-controlled diabetes mellitus ([place]) ?- Monitor blood glucose on chemistries ? ?AKI (acute kidney injury) ([place]) ?- Creatinine at baseline 1.47 ?Today creatinine is 2.09 >>>  ?Bicarb is 18 ?Likely secondary to sepsis ?Continue IV hydration ?Avoid nephrotoxic agents when possible ? ?Chronic obstructive pulmonary disease (HCC) ?- Continue albuterol as needed ?-Continue Anoro ?-At baseline with 3 L of oxygen, satting 98% ? ? ?PAD (peripheral artery disease) ([place]) ?- Continue aspirin, Eliquis, patient is not on a statin ? ? ?HTN (hypertension) ?-Blood pressure was low on arrival 81/51, continue to improve 110/48 now  ?- Holding hydralazine and Coreg given soft blood pressures ?-Continue Entresto for heart failure ? ? ? ?Skin Assessment: ?I have examined the patient's skin and I agree with the wound assessment as performed by wound care team ?As outlined belowe: ?Pressure Injury 04/08/20 Coccyx Stage 1 -  Intact skin with non-blanchable redness of a localized area usually over a bony prominence. (Active)  ?04/08/20 2015  ?Location: Coccyx  ?Location Orientation:   ?Staging: Stage 1 -  Intact skin with non-blanchable redness of a localized area usually over a bony prominence.  ?Wound Description (Comments):   ?Present on Admission: Yes  ?   ?Pressure Injury 03/09/2022 Coccyx Mid;Bilateral;Medial Stage 1 -  Intact skin with non-blanchable redness of a localized area usually over a bony prominence. Redness to coccyx & bilateral upper buttocks - some areas noted non-blanchable - cleansed & sacral (Active)  ?03/17/2022 0800  ?Location:  Coccyx  ?Location Orientation: Mid;Bilateral;Medial  ?Staging: Stage 1 -  Intact skin with non-blanchable redness of a localized area usually over a bony prominence.  ?Wound Description (Comments): Redness to coccyx & bilateral upper buttocks - some areas noted non-blanchable - cleansed & sacral foam applied  ?Present on Admission: Yes  ?Dressing Type Foam - Lift dressing to assess site every shift 03/14/2022 0800  ? ? ? ?----------------------------------------------------------------------------------------------------------------- ?Cultures; ?Blood Cultures x 2 >> NGT ?Urine Culture  >>> NGT  ? ?----------------------------------------------------------------------------------------------------------------- ? ?DVT prophylaxis:  ?apixaban (ELIQUIS) tablet 2.5 mg Start: 03/22/2022 1000 ?SCDs Start: 03/17/2022 0700 ?apixaban (ELIQUIS) tablet 2.5 mg  ? ?Code Status:   Code Status: Full Code ? ?Family Communication: Discussed with patient awake alert oriented x3  ?the above findings and plan of care has been discussed with patient (and family)  in detail,  ?they expressed understanding and agreement of  above. ?-Advance care planning has been discussed.  ? ?Admission status:   ?Status is: Inpatient ?Remains inpatient appropriate because: Needing treatment for sepsis, IV fluid, IV antibiotics, lactic acidosis, elevated troponin ? ? ? ? ?Procedures:  ? ?No admission procedures for hospital encounter. ? ? ?Antimicrobials:  ?Anti-infectives (From admission, onward)  ? ? Start     Dose/Rate Route Frequency Ordered Stop  ? 02/27/22 0000  cefTRIAXone (ROCEPHIN) 1 g in sodium chloride 0.9 % 100 mL IVPB       ? 1 g ?200 mL/hr over 30 Minutes Intravenous Every 24 hours 03/23/2022 0603    ? 03/09/2022 0800  azithromycin (ZITHROMAX) 500 mg in sodium chloride 0.9 % 250 mL IVPB  Status:  Discontinued       ? 500 mg ?250 mL/hr over 60 Minutes Intravenous Every 24 hours 03/01/2022 0753 03/12/2022 0804  ? 03/22/2022 0400  azithromycin (ZITHROMAX) 500  mg in sodium chloride 0.9 % 250 mL IVPB       ? 500 mg ?250 mL/hr over 60 Minutes Intravenous Every 24 hours 03/10/2022 0347    ? 03/22/2022 0245  cefTRIAXone (ROCEPHIN) 2 g in sodium chloride 0.9 % 100 mL I

## 2022-02-26 NOTE — Assessment & Plan Note (Addendum)
Initial troponin 535, repeat 416 ?Likely ischemic demand given hypotension- sepsis ?Patient is already on aspirin and Eliquis ?Continue to monitor ?Denies any chest pain, subtle changes in EKG possible chronic ?Consult cardiology for evaluation ?

## 2022-02-26 NOTE — Consult Note (Addendum)
?Cardiology Consultation:  ? ?Patient ID: Gary Odonnell ?MRN: [id number]; DOB: [birth year] ? ?Admit date: 03/21/2022 ?Date of Consult: 03/19/2022 ? ?PCP:  Loman Brooklyn, FNP ?  ?[facility] Providers ?Cardiologist:  Carlyle Dolly, MD      ? ? ?Patient Profile:  ? ?Gary Odonnell is a 88 y.o. male with a hx of CAD who is being seen 03/14/2022 for the evaluation of elevated troponins at the request of Dr. Roger Shelter. ? ?History of Present Illness:  ? ?[name] is an 88 yo patient with history of CAD CABG [address] [place], cath 09/2013 LIMA-LAD patent,graft from [facility] to OM1, OM2, PDA occluded. No PCI targets, recommendations for medical management. RHC mean PA 43, wedge 22, CI 1.74.  NSTEMI 03/2020 1/4 patent grafts, med therapy on ASA and eliquis. ICM EF 40-45% echo 09/2020. Diarrhea on aldactone, didn't want jardiance. PAF 12/2018 on eliquis, HTN, HLD, PAD stent left ext iliac and R common ilac s/p R fem to below knee pop bypass 09/2015. ? ?Patient admitted with sepsis, UTI possible pneumonia presenting with generalized weakness for 2 days and fall, dysuria. In ED temp 100.7, BP 89/64, HR 48-94, K 5.4 Crt 2.09, UTI. Denies chest pain or other cardiac complaints but initial troponin 535, 416. He has been coughing a lot and continues to smoke 1/2 ppd. ? ? ?Past Medical History:  ?Diagnosis Date  ? Atrial fibrillation ([place])   ? CHF (congestive heart failure) ([place])   ? a. EF 40-45% in 2015, at EF 30-35% by echo in 12/2018  ? COPD (chronic obstructive pulmonary disease) ([place])   ? Coronary artery disease   ? a. s/p CABG with most recent cath in 2014 showing patent LIMA-LAD, occluded Radial to OM1-OM2-PDA and occluded RCA with L--> R collaterals  ? Diabetic nephropathy ([place])   ? Essential hypertension   ? Hyperlipidemia   ? PAD (peripheral artery disease) ([place])   ? Pneumonia 09/2013  ? Primary cancer of left lower lobe of lung ([place]) 02/02/2015  ? Radiation 02/2015  ? Spinal stenosis   ? Stroke [facility] 11/2008  ? Type 2 diabetes  mellitus ([place])   ? ? ?Past Surgical History:  ?Procedure Laterality Date  ? Angioplasty to Left femoral artery    ? CARPAL TUNNEL RELEASE Left 05/11/2014  ? Procedure: LEFT CARPAL TUNNEL RELEASE;  Surgeon: Carole Civil, MD;  Location: [facility] ORS;  Service: Orthopedics;  Laterality: Left;  ? COLONOSCOPY N/A 01/24/2015  ? Procedure: COLONOSCOPY;  Surgeon: Danie Binder, MD;  Location: [facility] ENDO SUITE;  Service: Endoscopy;  Laterality: N/A;  130  ? CORONARY ARTERY BYPASS GRAFT    ? x4  ? FEMORAL-POPLITEAL BYPASS GRAFT Right 10/23/2015  ? Procedure: BYPASS GRAFT FEMORAL TO BELOW KNEE POPLITEAL ARTERY USING RIGHT NON-REVERSED GREATER SAPPHENOUS VEIN;  Surgeon: [name], MD;  Location: [facility];  Service: Vascular;  Laterality: Right;  ? INTRAOPERATIVE ARTERIOGRAM Right 10/23/2015  ? Procedure: INTRA OPERATIVE ARTERIOGRAM - RIGHT LOWER LEG;  Surgeon: [name], MD;  Location: [facility] OR;  Service: Vascular;  Laterality: Right;  ? KNEE ARTHROSCOPY Right   ? LEFT AND RIGHT HEART CATHETERIZATION WITH CORONARY ANGIOGRAM N/A 09/29/2013  ? Procedure: LEFT AND RIGHT HEART CATHETERIZATION WITH CORONARY ANGIOGRAM;  Surgeon: Burnell Blanks, MD;  Location: [facility] CATH LAB;  Service: Cardiovascular;  Laterality: N/A;  ? LEFT HEART CATH AND CORS/GRAFTS ANGIOGRAPHY N/A 04/11/2020  ? Procedure: LEFT HEART CATH AND CORS/GRAFTS ANGIOGRAPHY;  Surgeon: Burnell Blanks, MD;  Location:  [place] INVASIVE CV LAB;  Service: Cardiovascular;  Laterality: N/A;  ? PERIPHERAL VASCULAR CATHETERIZATION N/A 04/21/2015  ? Procedure: Abdominal Aortogram w/Lower Extremity;  Surgeon: [name], MD;  Location: [place] CV LAB;  Service: Cardiovascular;  Laterality: N/A;  ? Stent to right femoral artery    ? TRIGGER FINGER RELEASE Bilateral   ? VEIN HARVEST Right 10/23/2015  ? Procedure: [place];  Surgeon: [name], MD;  Location: [place];  Service: Vascular;  Laterality: Right;  ?  ? ?Home Medications:   ?Prior to Admission medications   ?Medication Sig Start Date End Date Taking? Authorizing Provider  ?acetaminophen (TYLENOL) 325 MG tablet Take 2 tablets (650 mg total) by mouth every 6 (six) hours as needed for mild pain or headache (or Fever >/= 101). 09/06/21   Roxan Hockey, MD  ?albuterol (PROVENTIL) (2.5 MG/3ML) 0.083% nebulizer solution Take 3 mLs (2.5 mg total) by nebulization every 6 (six) hours as needed for shortness of breath. 10/08/20   Kathie Dike, MD  ?albuterol (VENTOLIN HFA) 108 (90 Base) MCG/ACT inhaler Inhale 2 puffs into the lungs every 6 (six) hours as needed for wheezing or shortness of breath. 04/11/20   Debbe Odea, MD  ?apixaban (ELIQUIS) 2.5 MG TABS tablet Take 1 tablet (2.5 mg total) by mouth 2 (two) times daily. 01/09/22   Arnoldo Lenis, MD  ?aspirin 81 MG EC tablet Take 1 tablet (81 mg total) by mouth daily with breakfast. 09/06/21   Roxan Hockey, MD  ?carvedilol (COREG) 6.25 MG tablet Take 1 tablet (6.25 mg total) by mouth 2 (two) times daily with a meal. 09/06/21   Emokpae, Courage, MD  ?ezetimibe (ZETIA) 10 MG tablet Take 1 tablet (10 mg total) by mouth daily. 05/31/21   Arnoldo Lenis, MD  ?furosemide (LASIX) 40 MG tablet Take 1 tablet (40 mg total) by mouth daily. (MAY TAKE AN ADDITIONAL 1/2 TAB AS NEEDED FOR SWELLING) - 09/06/21   Roxan Hockey, MD  ?hydrALAZINE (APRESOLINE) 25 MG tablet TAKE 1 AND 1/2 TABLETS(37.5 MG) BY MOUTH THREE TIMES DAILY 10/05/21   Arnoldo Lenis, MD  ?nitroGLYCERIN (NITROSTAT) 0.3 MG SL tablet Place 0.3 mg under the tongue every 5 (five) minutes as needed for chest pain.    [provider]  ?Potassium 99 MG TABS Take 1 tablet by mouth daily.     [provider]  ?predniSONE (DELTASONE) 50 MG tablet Take 1 tablet (50 mg total) by mouth daily with breakfast. 2/62/03   Delora Fuel, MD  ?sacubitril-valsartan (ENTRESTO) 49-51 MG Take 1 tablet by mouth 2 (two) times daily. 09/06/21   Roxan Hockey, MD   ?umeclidinium-vilanterol (ANORO ELLIPTA) 62.5-25 MCG/INH AEPB Inhale 1 puff into the lungs daily. 09/07/21   Roxan Hockey, MD  ? ? ?Inpatient Medications: ?Scheduled Meds: ? apixaban  2.5 mg Oral BID  ? aspirin EC  81 mg Oral Q breakfast  ? Chlorhexidine Gluconate Cloth  6 each Topical Q0600  ? ezetimibe  10 mg Oral Daily  ? lactobacillus  1 g Oral TID WC  ? sacubitril-valsartan  1 tablet Oral BID  ? umeclidinium-vilanterol  1 puff Inhalation Daily  ? ?Continuous Infusions: ? azithromycin Stopped (03/20/2022 5597)  ? [START ON 02/27/2022] cefTRIAXone (ROCEPHIN)  IV    ? lactated ringers    ? ?PRN Meds: ?acetaminophen **OR** acetaminophen, albuterol, ondansetron **OR** ondansetron (ZOFRAN) IV, oxyCODONE, polyethylene glycol ? ?Allergies:    ?Allergies  ?Allergen Reactions  ? Statins Other (See Comments)  ?  Intolerance per outpt Cardiology notes  ? Aldactone [Spironolactone] Diarrhea  ? ? ?Social History:   ?Social History  ? ?Socioeconomic History  ? Marital status: Married  ?  Spouse name: Not on file  ? Number of children: Not on file  ? Years of education: Not on file  ? Highest education level: Not on file  ?Occupational History  ? Not on file  ?Tobacco Use  ? Smoking status: Every Day  ?  Packs/day: 0.50  ?  Years: 60.00  ?  Pack years: 30.00  ?  Types: Cigars, Cigarettes  ?  Start date: 11/21/1949  ? Smokeless tobacco: Never  ? Tobacco comments:  ?  1-2 cigars per day   ?Vaping Use  ? Vaping Use: Never used  ?Substance and Sexual Activity  ? Alcohol use: No  ?  Alcohol/week: 0.0 standard drinks  ? Drug use: No  ? Sexual activity: Not on file  ?Other Topics Concern  ? Not on file  ?Social History Narrative  ? Not on file  ? ?Social Determinants of Health  ? ?Financial Resource Strain: Not on file  ?Food Insecurity: Not on file  ?Transportation Needs: Not on file  ?Physical Activity: Not on file  ?Stress: Not on file  ?Social Connections: Not on file  ?Intimate Partner Violence: Not on file  ?  ?Family History:    ?   ?Family History  ?Problem Relation Age of Onset  ? Heart attack Father   ? Hyperlipidemia Father   ? Diabetes Mother   ? Diabetes Brother   ? Heart Problems Brother   ? Colon cancer Neg Hx   ?  ? ?ROS:  ?P

## 2022-02-26 NOTE — ED Provider Notes (Signed)
?[facility] ?Provider Note ? ? ?CSN: [id number] ?Arrival date & time: 03/09/2022  0059 ? ?  ? ?History ? ?Chief Complaint  ?Patient presents with  ? Fall  ? ? ?Gary Odonnell is a 88 y.o. male. ? ?88 yo M w/ h/o CHF and multiple other medical problems wthat presents to the ED tonight wsith gneralized wekaness and multiple falls over last couple days. No specific complaint. Just doesn't feel well. May have hit head. May be nauseous.  ? ? ?Fall ? ? ?  ? ?Home Medications ?Prior to Admission medications   ?Medication Sig Start Date End Date Taking? Authorizing Provider  ?acetaminophen (TYLENOL) 325 MG tablet Take 2 tablets (650 mg total) by mouth every 6 (six) hours as needed for mild pain or headache (or Fever >/= 101). 09/06/21   Roxan Hockey, MD  ?albuterol (PROVENTIL) (2.5 MG/3ML) 0.083% nebulizer solution Take 3 mLs (2.5 mg total) by nebulization every 6 (six) hours as needed for shortness of breath. 10/08/20   Kathie Dike, MD  ?albuterol (VENTOLIN HFA) 108 (90 Base) MCG/ACT inhaler Inhale 2 puffs into the lungs every 6 (six) hours as needed for wheezing or shortness of breath. 04/11/20   Debbe Odea, MD  ?apixaban (ELIQUIS) 2.5 MG TABS tablet Take 1 tablet (2.5 mg total) by mouth 2 (two) times daily. 01/09/22   Arnoldo Lenis, MD  ?aspirin 81 MG EC tablet Take 1 tablet (81 mg total) by mouth daily with breakfast. 09/06/21   Roxan Hockey, MD  ?carvedilol (COREG) 6.25 MG tablet Take 1 tablet (6.25 mg total) by mouth 2 (two) times daily with a meal. 09/06/21   Emokpae, Courage, MD  ?ezetimibe (ZETIA) 10 MG tablet Take 1 tablet (10 mg total) by mouth daily. 05/31/21   Arnoldo Lenis, MD  ?furosemide (LASIX) 40 MG tablet Take 1 tablet (40 mg total) by mouth daily. (MAY TAKE AN ADDITIONAL 1/2 TAB AS NEEDED FOR SWELLING) - 09/06/21   Roxan Hockey, MD  ?hydrALAZINE (APRESOLINE) 25 MG tablet TAKE 1 AND 1/2 TABLETS(37.5 MG) BY MOUTH THREE TIMES DAILY 10/05/21   Arnoldo Lenis, MD   ?nitroGLYCERIN (NITROSTAT) 0.3 MG SL tablet Place 0.3 mg under the tongue every 5 (five) minutes as needed for chest pain.    [provider]  ?Potassium 99 MG TABS Take 1 tablet by mouth daily.     [provider]  ?predniSONE (DELTASONE) 50 MG tablet Take 1 tablet (50 mg total) by mouth daily with breakfast. 5/39/76   Delora Fuel, MD  ?sacubitril-valsartan (ENTRESTO) 49-51 MG Take 1 tablet by mouth 2 (two) times daily. 09/06/21   Roxan Hockey, MD  ?umeclidinium-vilanterol (ANORO ELLIPTA) 62.5-25 MCG/INH AEPB Inhale 1 puff into the lungs daily. 09/07/21   Roxan Hockey, MD  ?   ? ?Allergies    ?Statins and Aldactone [spironolactone]   ? ?Review of Systems   ?Review of Systems ? ?Physical Exam ?Updated Vital Signs ?BP 92/76   Pulse 80   Temp (!) 100.7 ?F (38.2 ?C)   Resp 18   Ht 5\' 11"  (1.803 m)   Wt 77 kg   SpO2 97%   BMI 23.68 kg/m?  ?Physical Exam ?Vitals and nursing note reviewed.  ?Constitutional:   ?   Appearance: He is well-developed.  ?HENT:  ?   Head: Normocephalic and atraumatic.  ?   Mouth/Throat:  ?   Mouth: Mucous membranes are moist.  ?   Pharynx: Oropharynx is clear.  ?Eyes:  ?  Pupils: Pupils are equal, round, and reactive to light.  ?Cardiovascular:  ?   Rate and Rhythm: Normal rate.  ?Pulmonary:  ?   Effort: Pulmonary effort is normal. No respiratory distress.  ?Abdominal:  ?   General: Abdomen is flat. There is no distension.  ?Musculoskeletal:     ?   General: No swelling or tenderness. Normal range of motion.  ?   Cervical back: Normal range of motion.  ?Skin: ?   General: Skin is warm and dry.  ?Neurological:  ?   General: No focal deficit present.  ?   Mental Status: He is alert.  ? ? ?ED Results / Procedures / Treatments   ?Labs ?(all labs ordered are listed, but only abnormal results are displayed) ?Labs Reviewed  ?COMPREHENSIVE METABOLIC PANEL - Abnormal; Notable for the following components:  ?    Result Value  ? Potassium 5.4 (*)   ? CO2 18 (*)   ?  Glucose, Bld 116 (*)   ? BUN 44 (*)   ? Creatinine, Ser 2.09 (*)   ? Calcium 8.8 (*)   ? AST 365 (*)   ? ALT 171 (*)   ? Alkaline Phosphatase 128 (*)   ? Total Bilirubin 1.5 (*)   ? GFR, Estimated 30 (*)   ? Anion gap 17 (*)   ? All other components within normal limits  ?CBC WITH DIFFERENTIAL/PLATELET - Abnormal; Notable for the following components:  ? RBC 3.68 (*)   ? Hemoglobin 10.7 (*)   ? HCT 35.8 (*)   ? MCHC 29.9 (*)   ? RDW 16.7 (*)   ? Platelets 129 (*)   ? All other components within normal limits  ?URINALYSIS, ROUTINE W REFLEX MICROSCOPIC - Abnormal; Notable for the following components:  ? APPearance TURBID (*)   ? Protein, ur 100 (*)   ? Leukocytes,Ua LARGE (*)   ? RBC / HPF >50 (*)   ? WBC, UA >50 (*)   ? Bacteria, UA RARE (*)   ? All other components within normal limits  ?BRAIN NATRIURETIC PEPTIDE - Abnormal; Notable for the following components:  ? B Natriuretic Peptide 1,448.0 (*)   ? All other components within normal limits  ?PROTIME-INR - Abnormal; Notable for the following components:  ? Prothrombin Time 25.0 (*)   ? INR 2.3 (*)   ? All other components within normal limits  ?LACTIC ACID, PLASMA - Abnormal; Notable for the following components:  ? Lactic Acid, Venous 5.8 (*)   ? All other components within normal limits  ?CBG MONITORING, ED - Abnormal; Notable for the following components:  ? Glucose-Capillary 105 (*)   ? All other components within normal limits  ?TROPONIN I (HIGH SENSITIVITY) - Abnormal; Notable for the following components:  ? Troponin I (High Sensitivity) 535 (*)   ? All other components within normal limits  ?TROPONIN I (HIGH SENSITIVITY) - Abnormal; Notable for the following components:  ? Troponin I (High Sensitivity) 416 (*)   ? All other components within normal limits  ?C DIFFICILE QUICK SCREEN W PCR REFLEX    ?CULTURE, BLOOD (ROUTINE X 2)  ?CULTURE, BLOOD (ROUTINE X 2)  ?GASTROINTESTINAL PANEL BY PCR, STOOL (REPLACES STOOL CULTURE)  ?AMMONIA  ?LACTIC ACID, PLASMA   ? ? ?EKG ?None ? ?Radiology ?DG Chest 2 View ? ?Result Date: 02/25/2022 ?CLINICAL DATA:  Cough and weakness EXAM: CHEST - 2 VIEW COMPARISON:  02/08/2022 FINDINGS: Mild cardiomegaly. Right basilar opacities. No sizable pleural effusion. No pneumothorax.  Remote median sternotomy. Chronic wedge compression deformity of the lower thoracic spine. IMPRESSION: Right basilar opacities, which may indicate developing infection or atelectasis. Electronically Signed   By: Ulyses Jarred M.D.   On: 02/27/2022 03:42  ? ?CT HEAD WO CONTRAST ? ?Result Date: 03/04/2022 ?CLINICAL DATA:  Head trauma, minor (Age >= 65y).  Fall EXAM: CT HEAD WITHOUT CONTRAST TECHNIQUE: Contiguous axial images were obtained from the base of the skull through the vertex without intravenous contrast. RADIATION DOSE REDUCTION: This exam was performed according to the departmental dose-optimization program which includes automated exposure control, adjustment of the mA and/or kV according to patient size and/or use of iterative reconstruction technique. COMPARISON:  01/10/2015 FINDINGS: Brain: Old left frontal infarct, unchanged. There is atrophy and chronic small vessel disease changes. No acute intracranial abnormality. Specifically, no hemorrhage, hydrocephalus, mass lesion, acute infarction, or significant intracranial injury. Vascular: No hyperdense vessel or unexpected calcification. Skull: No acute calvarial abnormality. Sinuses/Orbits: No acute findings Other: None IMPRESSION: Chronic left frontal infarct, stable. Atrophy, chronic microvascular disease. No acute intracranial abnormality. Electronically Signed   By: Rolm Baptise M.D.   On: 03/15/2022 03:32   ? ?Procedures ?Marland KitchenCritical Care ?Performed by: Merrily Pew, MD ?Authorized by: Merrily Pew, MD  ? ?Critical care provider statement:  ?  Critical care time (minutes):  32 ?  Critical care time was exclusive of:  Separately billable procedures and treating other patients and teaching time ?  Critical  care was necessary to treat or prevent imminent or life-threatening deterioration of the following conditions:  Sepsis and cardiac failure ?  Critical care was time spent personally by me on the following activiti

## 2022-02-26 NOTE — ED Triage Notes (Signed)
Per wife pt keeps falling out of bed and is unable to help get himself up. Pt is also incontinent which is new.  ?

## 2022-02-26 NOTE — H&P (Signed)
?History and Physical  ? ? ?Patient: Gary Odonnell QIH:[id number] DOB: [birth year] ?DOA: 03/04/2022 ?DOS: the patient was seen and examined on 03/13/2022 ?PCP: Loman Brooklyn, FNP  ?Patient coming from: Home ? ?Chief Complaint:  ?Chief Complaint  ?Patient presents with  ? Fall  ? ?HPI: Gary Odonnell is a 88 y.o. male with medical history significant of with history of atrial fibrillation, CHF-at baseline BMP today, COPD, coronary artery disease, essential hypertension, hyperlipidemia, peripheral artery disease, stroke, diet-controlled diabetes, and more presents the ED with a chief complaint of generalized weakness.  Patient reports he had generalized weakness for 2 days.  He had a fall as well.  He reports that his weakness is worse since it started.  He cannot get out of bed and he cannot get out of his chair.  He reports that he fell trying to get up.  Patient denies any pain.  He does not think he has had a fever, but reports that he does not have a thermometer.  He has not felt any more short of breath than his normal.  He is on his baseline oxygen 3 L nasal cannula.  Patient reports that he has had dysuria and a significant decrease in urine output.  He also had diarrhea about 3 times in the last week.  He reports the diarrhea started 4 to days ago.  Patient reports he just does not know what is wrong, he feels so weak. ? ?Patient does not use drugs, does not drink alcohol.  He is a current smoker, half pack per day.  Patient is not interested in quitting.  Patient is not interested in a nicotine patch.  Patient is full code. ?Review of Systems: As mentioned in the history of present illness. All other systems reviewed and are negative. ?Past Medical History:  ?Diagnosis Date  ? Atrial fibrillation ([place])   ? CHF (congestive heart failure) ([place])   ? a. EF 40-45% in 2015, at EF 30-35% by echo in 12/2018  ? COPD (chronic obstructive pulmonary disease) ([place])   ? Coronary artery disease   ? a. s/p CABG with most  recent cath in 2014 showing patent LIMA-LAD, occluded Radial to OM1-OM2-PDA and occluded RCA with L--> R collaterals  ? Diabetic nephropathy ([place])   ? Essential hypertension   ? Hyperlipidemia   ? PAD (peripheral artery disease) ([place])   ? Pneumonia 09/2013  ? Primary cancer of left lower lobe of lung ([place]) 02/02/2015  ? Radiation 02/2015  ? Spinal stenosis   ? Stroke [facility]) 11/2008  ? Type 2 diabetes mellitus ([place])   ? ?Past Surgical History:  ?Procedure Laterality Date  ? Angioplasty to Left femoral artery    ? CARPAL TUNNEL RELEASE Left 05/11/2014  ? Procedure: LEFT CARPAL TUNNEL RELEASE;  Surgeon: Carole Civil, MD;  Location: [facility] ORS;  Service: Orthopedics;  Laterality: Left;  ? COLONOSCOPY N/A 01/24/2015  ? Procedure: COLONOSCOPY;  Surgeon: Danie Binder, MD;  Location: [facility] END[facility];  Service: Endoscopy;  Laterality: N/A;  130  ? CORONARY ARTERY BYPASS GRAFT    ? x4  ? FEMORAL-POPLITEAL BYPASS GRAFT Right 10/23/2015  ? Procedure: BYPASS GRAFT FEMORAL TO BELOW KNEE POPLITEAL ARTERY USING RIGHT NON-REVERSED GREATER SAPPHENOUS VEIN;  Surgeon: [name], MD;  Location: [place];  Service: Vascular;  Laterality: Right;  ? INTRAOPERATIVE ARTERIOGRAM Right 10/23/2015  ? Procedure: INTRA OPERATIVE ARTERIOGRAM - RIGHT LOWER LEG;  Surgeon: [name], MD;  Location: [place];  Service:  Vascular;  Laterality: Right;  ? KNEE ARTHROSCOPY Right   ? LEFT AND RIGHT HEART CATHETERIZATION WITH CORONARY ANGIOGRAM N/A 09/29/2013  ? Procedure: LEFT AND RIGHT HEART CATHETERIZATION WITH CORONARY ANGIOGRAM;  Surgeon: Burnell Blanks, MD;  Location: [facility] CATH LAB;  Service: Cardiovascular;  Laterality: N/A;  ? LEFT HEART CATH AND CORS/GRAFTS ANGIOGRAPHY N/A 04/11/2020  ? Procedure: LEFT HEART CATH AND CORS/GRAFTS ANGIOGRAPHY;  Surgeon: Burnell Blanks, MD;  Location: [place] CV LAB;  Service: Cardiovascular;  Laterality: N/A;  ? PERIPHERAL VASCULAR CATHETERIZATION N/A 04/21/2015  ? Procedure: Abdominal Aortogram  w/Lower Extremity;  Surgeon: [name], MD;  Location: [place] CV LAB;  Service: Cardiovascular;  Laterality: N/A;  ? Stent to right femoral artery    ? TRIGGER FINGER RELEASE Bilateral   ? VEIN HARVEST Right 10/23/2015  ? Procedure: [place];  Surgeon: [name], MD;  Location: [place];  Service: Vascular;  Laterality: Right;  ? ?Social History:  reports that he has been smoking cigars and cigarettes. He started smoking about 72 years ago. He has a 30.00 pack-year smoking history. He has never used smokeless tobacco. He reports that he does not drink alcohol and does not use drugs. ? ?Allergies  ?Allergen Reactions  ? Statins Other (See Comments)  ?  Intolerance per outpt Cardiology notes  ? Aldactone [Spironolactone] Diarrhea  ? ? ?Family History  ?Problem Relation Age of Onset  ? Heart attack Father   ? Hyperlipidemia Father   ? Diabetes Mother   ? Diabetes Brother   ? Heart Problems Brother   ? Colon cancer Neg Hx   ? ? ?Prior to Admission medications   ?Medication Sig Start Date End Date Taking? Authorizing Provider  ?acetaminophen (TYLENOL) 325 MG tablet Take 2 tablets (650 mg total) by mouth every 6 (six) hours as needed for mild pain or headache (or Fever >/= 101). 09/06/21   Roxan Hockey, MD  ?albuterol (PROVENTIL) (2.5 MG/3ML) 0.083% nebulizer solution Take 3 mLs (2.5 mg total) by nebulization every 6 (six) hours as needed for shortness of breath. 10/08/20   Kathie Dike, MD  ?albuterol (VENTOLIN HFA) 108 (90 Base) MCG/ACT inhaler Inhale 2 puffs into the lungs every 6 (six) hours as needed for wheezing or shortness of breath. 04/11/20   Debbe Odea, MD  ?apixaban (ELIQUIS) 2.5 MG TABS tablet Take 1 tablet (2.5 mg total) by mouth 2 (two) times daily. 01/09/22   Arnoldo Lenis, MD  ?aspirin 81 MG EC tablet Take 1 tablet (81 mg total) by mouth daily with breakfast. 09/06/21   Roxan Hockey, MD  ?carvedilol (COREG) 6.25 MG tablet Take 1 tablet (6.25  mg total) by mouth 2 (two) times daily with a meal. 09/06/21   Emokpae, Courage, MD  ?ezetimibe (ZETIA) 10 MG tablet Take 1 tablet (10 mg total) by mouth daily. 05/31/21   Arnoldo Lenis, MD  ?furosemide (LASIX) 40 MG tablet Take 1 tablet (40 mg total) by mouth daily. (MAY TAKE AN ADDITIONAL 1/2 TAB AS NEEDED FOR SWELLING) - 09/06/21   Roxan Hockey, MD  ?hydrALAZINE (APRESOLINE) 25 MG tablet TAKE 1 AND 1/2 TABLETS(37.5 MG) BY MOUTH THREE TIMES DAILY 10/05/21   Arnoldo Lenis, MD  ?nitroGLYCERIN (NITROSTAT) 0.3 MG SL tablet Place 0.3 mg under the tongue every 5 (five) minutes as needed for chest pain.    [provider]  ?Potassium 99 MG TABS Take 1 tablet by mouth daily.     [provider]  ?predniSONE (DELTASONE) 50 MG tablet Take 1 tablet (50 mg total) by mouth daily with breakfast. 8/46/96   Delora Fuel, MD  ?sacubitril-valsartan (ENTRESTO) 49-51 MG Take 1 tablet by mouth 2 (two) times daily. 09/06/21   Roxan Hockey, MD  ?umeclidinium-vilanterol (ANORO ELLIPTA) 62.5-25 MCG/INH AEPB Inhale 1 puff into the lungs daily. 09/07/21   Roxan Hockey, MD  ? ? ?Physical Exam: ?Vitals:  ? 03/05/2022 0116 03/08/2022 0200 03/20/2022 0330 03/04/2022 0415  ?BP: 110/88   92/76  ?Pulse: 94   80  ?Resp: 18  (!) 23 18  ?Temp: 98.5 ?F (36.9 ?C) (!) 100.7 ?F (38.2 ?C)    ?TempSrc: Axillary     ?SpO2: 96%  (!) 70% 97%  ?Weight:      ?Height:      ? ?1.  General: ?Patient lying supine in bed,  no acute distress ?  ?2. Psychiatric: ?Alert and oriented x 3, mood and behavior normal for situation, irritable and cooperative with exam ?  ?3. Neurologic: ?Speech-slightly slurred, but patient reports this is because he is sleepy and language are normal, face is symmetric, moves all 4 extremities voluntarily, at baseline without acute deficits on limited exam ?  ?4. HEENMT:  ?Head is atraumatic, normocephalic, pupils reactive to light, neck is supple, trachea is midline, mucous membranes are moist ?  ?5.  Respiratory : ?Lungs are clear to auscultation bilaterally without wheezing, rhonchi, rales, no cyanosis, no increase in work of breathing or accessory muscle use ?  ?6. Cardiovascular : ?Heart rate normal, rhythm i

## 2022-02-27 ENCOUNTER — Inpatient Hospital Stay (HOSPITAL_COMMUNITY): Payer: Medicare Other

## 2022-02-27 DIAGNOSIS — I5022 Chronic systolic (congestive) heart failure: Secondary | ICD-10-CM

## 2022-02-27 DIAGNOSIS — R188 Other ascites: Secondary | ICD-10-CM | POA: Diagnosis not present

## 2022-02-27 DIAGNOSIS — I4819 Other persistent atrial fibrillation: Secondary | ICD-10-CM | POA: Diagnosis not present

## 2022-02-27 DIAGNOSIS — A419 Sepsis, unspecified organism: Secondary | ICD-10-CM | POA: Diagnosis not present

## 2022-02-27 DIAGNOSIS — N281 Cyst of kidney, acquired: Secondary | ICD-10-CM | POA: Diagnosis not present

## 2022-02-27 DIAGNOSIS — R748 Abnormal levels of other serum enzymes: Secondary | ICD-10-CM | POA: Diagnosis not present

## 2022-02-27 DIAGNOSIS — N179 Acute kidney failure, unspecified: Secondary | ICD-10-CM | POA: Diagnosis not present

## 2022-02-27 DIAGNOSIS — K8020 Calculus of gallbladder without cholecystitis without obstruction: Secondary | ICD-10-CM | POA: Diagnosis not present

## 2022-02-27 DIAGNOSIS — R6520 Severe sepsis without septic shock: Secondary | ICD-10-CM | POA: Diagnosis not present

## 2022-02-27 LAB — CORTISOL-AM, BLOOD: Cortisol - AM: 13.8 ug/dL (ref 6.7–22.6)

## 2022-02-27 LAB — CBC WITH DIFFERENTIAL/PLATELET
Abs Immature Granulocytes: 0.03 10*3/uL (ref 0.00–0.07)
Basophils Absolute: 0 10*3/uL (ref 0.0–0.1)
Basophils Relative: 0 %
Eosinophils Absolute: 0 10*3/uL (ref 0.0–0.5)
Eosinophils Relative: 0 %
HCT: 33 % — ABNORMAL LOW (ref 39.0–52.0)
Hemoglobin: 9.8 g/dL — ABNORMAL LOW (ref 13.0–17.0)
Immature Granulocytes: 0 %
Lymphocytes Relative: 16 %
Lymphs Abs: 1.2 10*3/uL (ref 0.7–4.0)
MCH: 28.6 pg (ref 26.0–34.0)
MCHC: 29.7 g/dL — ABNORMAL LOW (ref 30.0–36.0)
MCV: 96.2 fL (ref 80.0–100.0)
Monocytes Absolute: 0.3 10*3/uL (ref 0.1–1.0)
Monocytes Relative: 4 %
Neutro Abs: 5.9 10*3/uL (ref 1.7–7.7)
Neutrophils Relative %: 80 %
Platelets: 112 10*3/uL — ABNORMAL LOW (ref 150–400)
RBC: 3.43 MIL/uL — ABNORMAL LOW (ref 4.22–5.81)
RDW: 16.4 % — ABNORMAL HIGH (ref 11.5–15.5)
WBC: 7.4 10*3/uL (ref 4.0–10.5)
nRBC: 0 % (ref 0.0–0.2)

## 2022-02-27 LAB — PROTIME-INR
INR: 1.7 — ABNORMAL HIGH (ref 0.8–1.2)
Prothrombin Time: 20.2 seconds — ABNORMAL HIGH (ref 11.4–15.2)

## 2022-02-27 LAB — COMPREHENSIVE METABOLIC PANEL
ALT: 215 U/L — ABNORMAL HIGH (ref 0–44)
AST: 271 U/L — ABNORMAL HIGH (ref 15–41)
Albumin: 2.9 g/dL — ABNORMAL LOW (ref 3.5–5.0)
Alkaline Phosphatase: 88 U/L (ref 38–126)
Anion gap: 9 (ref 5–15)
BUN: 43 mg/dL — ABNORMAL HIGH (ref 8–23)
CO2: 22 mmol/L (ref 22–32)
Calcium: 8.2 mg/dL — ABNORMAL LOW (ref 8.9–10.3)
Chloride: 108 mmol/L (ref 98–111)
Creatinine, Ser: 1.73 mg/dL — ABNORMAL HIGH (ref 0.61–1.24)
GFR, Estimated: 38 mL/min — ABNORMAL LOW (ref >60)
Glucose, Bld: 93 mg/dL (ref 70–99)
Potassium: 3.9 mmol/L (ref 3.5–5.1)
Sodium: 139 mmol/L (ref 135–145)
Total Bilirubin: 0.8 mg/dL (ref 0.3–1.2)
Total Protein: 5.4 g/dL — ABNORMAL LOW (ref 6.5–8.1)

## 2022-02-27 LAB — MAGNESIUM: Magnesium: 2.1 mg/dL (ref 1.7–2.4)

## 2022-02-27 LAB — PROCALCITONIN: Procalcitonin: 2.14 ng/mL

## 2022-02-27 MED ORDER — ALBUTEROL SULFATE (2.5 MG/3ML) 0.083% IN NEBU
2.5000 mg | INHALATION_SOLUTION | Freq: Four times a day (QID) | RESPIRATORY_TRACT | Status: DC
  Administered 2022-02-28 – 2022-03-03 (×14): 2.5 mg via RESPIRATORY_TRACT
  Filled 2022-02-27 (×13): qty 3

## 2022-02-27 MED ORDER — LOPERAMIDE HCL 2 MG PO CAPS
2.0000 mg | ORAL_CAPSULE | Freq: Four times a day (QID) | ORAL | Status: DC
  Administered 2022-02-27 – 2022-03-01 (×8): 2 mg via ORAL
  Filled 2022-02-27 (×9): qty 1

## 2022-02-27 MED ORDER — CARVEDILOL 3.125 MG PO TABS
3.1250 mg | ORAL_TABLET | Freq: Two times a day (BID) | ORAL | Status: DC
  Administered 2022-02-27 (×2): 3.125 mg via ORAL
  Filled 2022-02-27 (×2): qty 1

## 2022-02-27 NOTE — TOC Initial Note (Signed)
Transition of Care (TOC) - Initial/Assessment Note  ? ? ?Patient Details  ?Name: Gary Odonnell ?MRN: [id number] ?Date of Birth: 1932-07-21 ? ?Transition of Care (TOC) CM/SW Contact:    ?Salome Arnt, LCSW ?Phone Number: ?02/27/2022, 3:15 PM ? ?Clinical Narrative:  Pt admitted with sepsis. Assessment completed due to high risk readmission score. Pt's wife reports pt has required assist with ADLs recently and has been very weak the last few days prior to admission. At baseline, pt ambulates with a cane. Discussed d/c plan and wife is unsure what they will need. He is not currently active with home health services. Requested MD put in PT evaluation for recommendations. TOC will follow up.                ? ? ?  ?Barriers to Discharge: Continued Medical Work up ? ? ?Patient Goals and CMS Choice ?Patient states their goals for this hospitalization and ongoing recovery are:: unsure at this time ?  ?Choice offered to / list presented to : Spouse ? ?Expected Discharge Plan and Services ?  ?In-house Referral: Clinical Social Work ?  ?  ?Living arrangements for the past 2 months: [place] ?                ?  ?  ?  ?  ?  ?  ?  ?  ?  ?  ? ?Prior Living Arrangements/Services ?Living arrangements for the past 2 months: [place] ?Lives with:: Spouse ?Patient language and need for interpreter reviewed:: Yes ?Do you feel safe going back to the place where you live?: Yes      ?  ?  ?Current home services: DME (cane, walker, wheelchair, home O2, 3N1) ?Criminal Activity/Legal Involvement Pertinent to Current Situation/Hospitalization: No - Comment as needed ? ?Activities of Daily Living ?Home Assistive Devices/Equipment: Cane (specify quad or straight), Walker (specify type) ?ADL Screening (condition at time of admission) ?Patient's cognitive ability adequate to safely complete daily activities?: Yes ?Is the patient deaf or have difficulty hearing?: No ?Does the patient have difficulty seeing, even when  wearing glasses/contacts?: No ?Does the patient have difficulty concentrating, remembering, or making decisions?: No ?Patient able to express need for assistance with ADLs?: Yes ?Does the patient have difficulty dressing or bathing?: No ?Independently performs ADLs?: Yes (appropriate for developmental age) ?Does the patient have difficulty walking or climbing stairs?: No ?Weakness of Legs: Both ?Weakness of Arms/Hands: None ? ?Permission Sought/Granted ?  ?  ?   ?   ?   ?   ? ?Emotional Assessment ?  ?  ?  ?  ?Alcohol / Substance Use: Not Applicable ?Psych Involvement: No (comment) ? ?Admission diagnosis:  Fall, initial encounter [W19.XXXA] ?Sepsis ([place]) [A41.9] ?Patient Active Problem List  ? Diagnosis Date Noted  ? Sepsis ([place]) 03/19/2022  ? UTI (urinary tract infection) 03/01/2022  ? Hyperkalemia 02/23/2022  ? COPD with acute exacerbation ([place]) 09/06/2021  ? Acute respiratory failure with hypoxia ([place]) 09/04/2021  ? Acute on chronic combined systolic and diastolic CHF (congestive heart failure) ([place]) 10/06/2020  ? DM type 2 (diabetes mellitus, type 2) ([place]) 10/06/2020  ? CKD (chronic kidney disease) stage 3, GFR 30-59 ml/min (HCC) 10/06/2020  ? Abnormal TSH 09/10/2020  ? Diabetic nephropathy ([place])   ? History of lung cancer 06/09/2020  ? Stage 3b chronic kidney disease ([place]) 06/08/2020  ? Elevated troponin   ? History of non-ST elevation myocardial infarction (NSTEMI) 04/08/2020  ? AKI (acute kidney injury) (  [place]) 01/10/2019  ? Diet-controlled diabetes mellitus ([place]) 01/10/2019  ? Chronic obstructive pulmonary disease ([place]) 01/05/2019  ? A-fib ([place]) 01/05/2019  ? Tobacco abuse   ? Left shoulder pain 09/01/2018  ? Iliac artery stenosis, left ([place]) 04/22/2015  ? PAD (peripheral artery disease) ([place]) 04/21/2015  ? Carpal tunnel syndrome, left 05/03/2014  ? HTN (hypertension) 09/26/2013  ? Chronic combined systolic and diastolic CHF (congestive heart failure) ([place]) 09/26/2013  ? History of CVA (cerebrovascular  accident) 09/26/2013  ? CAD (coronary atherosclerotic disease) 09/26/2013  ? S/P CABG x 4 09/26/2013  ? ?PCP:  Loman Brooklyn, FNP ?Pharmacy:   ?[facility] [place] ?[address] ?[place] [address] ?Phone: [phone] Fax: [phone] ? ?[facility] [phone] - [place], [address] ?[place] ?[place] [address] ?Phone: [phone] Fax: [phone] ? ? ? ? ?Social Determinants of Health (SDOH) Interventions ?  ? ?Readmission Risk Interventions ? ?  02/27/2022  ?  3:13 PM  ?Readmission Risk Prevention Plan  ?Transportation Screening Complete  ?[place] or Home Care Consult Complete  ?Social Work Consult for [place] Planning/Counseling Complete  ?Palliative Care Screening Not Applicable  ?Medication Review Press photographer) Complete  ? ? ? ?

## 2022-02-27 NOTE — Progress Notes (Signed)
? ?Progress Note ? ?Patient Name: Gary Odonnell ?Date of Encounter: 02/27/2022 ? ?[facility] Cardiologist: Carlyle Dolly, MD  ? ?Subjective  ? ?No complaints ? ?Inpatient Medications  ?  ?Scheduled Meds: ? apixaban  2.5 mg Oral BID  ? aspirin EC  81 mg Oral Q breakfast  ? Chlorhexidine Gluconate Cloth  6 each Topical Q0600  ? ezetimibe  10 mg Oral Daily  ? lactobacillus  1 g Oral TID WC  ? umeclidinium-vilanterol  1 puff Inhalation Daily  ? ?Continuous Infusions: ? azithromycin 250 mL/hr at 02/27/22 0700  ? cefTRIAXone (ROCEPHIN)  IV Stopped (02/27/22 0200)  ? ?PRN Meds: ?acetaminophen **OR** acetaminophen, albuterol, ondansetron **OR** ondansetron (ZOFRAN) IV, oxyCODONE, polyethylene glycol  ? ?Vital Signs  ?  ?Vitals:  ? 02/27/22 0400 02/27/22 0500 02/27/22 0600 02/27/22 0700  ?BP: (!) 104/57 (!) 91/54 (!) 124/50 120/64  ?Pulse: 67 72 66 66  ?Resp: 16 (!) 22 14 20   ?Temp: 97.7 ?F (36.5 ?C)     ?TempSrc: Oral     ?SpO2: 100% 98% 100% 100%  ?Weight:  78 kg    ?Height:      ? ? ?Intake/Output Summary (Last 24 hours) at 02/27/2022 0817 ?Last data filed at 02/27/2022 0700 ?Gross per 24 hour  ?Intake 1198.16 ml  ?Output 300 ml  ?Net 898.16 ml  ? ? ?  02/27/2022  ?  5:00 AM 03/18/2022  ?  8:00 AM 03/13/2022  ?  1:15 AM  ?Last 3 Weights  ?Weight (lbs) 171 lb 15.3 oz 166 lb 0.1 oz 169 lb 12.1 oz  ?Weight (kg) 78 kg 75.3 kg 77 kg  ?   ? ?Telemetry  ?  ?Rate controlled afib - Personally Reviewed ? ?ECG  ?  ?N/a - Personally Reviewed ? ?Physical Exam  ? ?GEN: No acute distress.   ?Neck: No JVD ?Cardiac: irreg ?Respiratory: coarse bilaterally ?GI: Soft, nontender, non-distended  ?MS: No edema; No deformity. ?Neuro:  Nonfocal  ?Psych: Normal affect  ? ?Labs  ?  ?High Sensitivity Troponin:   ?Recent Labs  ?Lab 02/25/2022 ?0210 03/23/2022 ?0423  ?TROPONINIHS 535* 416*  ?   ?Chemistry ?Recent Labs  ?Lab 03/09/2022 ?0210 02/27/22 ?0402  ?NA 140 139  ?K 5.4* 3.9  ?CL 105 108  ?CO2 18* 22  ?GLUCOSE 116* 93  ?BUN 44* 43*  ?CREATININE 2.09*  1.73*  ?CALCIUM 8.8* 8.2*  ?MG  --  2.1  ?PROT 6.8 5.4*  ?ALBUMIN 3.5 2.9*  ?AST 365* 271*  ?ALT 171* 215*  ?ALKPHOS 128* 88  ?BILITOT 1.5* 0.8  ?GFRNONAA 30* 38*  ?ANIONGAP 17* 9  ?  ?Lipids No results for input(s): CHOL, TRIG, HDL, LABVLDL, LDLCALC, CHOLHDL in the last 168 hours.  ?Hematology ?Recent Labs  ?Lab 03/15/2022 ?0210 02/27/22 ?0402  ?WBC 8.2 7.4  ?RBC 3.68* 3.43*  ?HGB 10.7* 9.8*  ?HCT 35.8* 33.0*  ?MCV 97.3 96.2  ?MCH 29.1 28.6  ?MCHC 29.9* 29.7*  ?RDW 16.7* 16.4*  ?PLT 129* 112*  ? ?Thyroid No results for input(s): TSH, FREET4 in the last 168 hours.  ?BNP ?Recent Labs  ?Lab 03/14/2022 ?0210  ?BNP 1,448.0*  ?  ?DDimer No results for input(s): DDIMER in the last 168 hours.  ? ?Radiology  ?  ?DG Chest 2 View ? ?Result Date: 03/02/2022 ?CLINICAL DATA:  Cough and weakness EXAM: CHEST - 2 VIEW COMPARISON:  02/08/2022 FINDINGS: Mild cardiomegaly. Right basilar opacities. No sizable pleural effusion. No pneumothorax. Remote median sternotomy. Chronic wedge compression deformity of  the lower thoracic spine. IMPRESSION: Right basilar opacities, which may indicate developing infection or atelectasis. Electronically Signed   By: Ulyses Jarred M.D.   On: 03/07/2022 03:42  ? ?CT HEAD WO CONTRAST ? ?Result Date: 03/15/2022 ?CLINICAL DATA:  Head trauma, minor (Age >= 65y).  Fall EXAM: CT HEAD WITHOUT CONTRAST TECHNIQUE: Contiguous axial images were obtained from the base of the skull through the vertex without intravenous contrast. RADIATION DOSE REDUCTION: This exam was performed according to the departmental dose-optimization program which includes automated exposure control, adjustment of the mA and/or kV according to patient size and/or use of iterative reconstruction technique. COMPARISON:  01/10/2015 FINDINGS: Brain: Old left frontal infarct, unchanged. There is atrophy and chronic small vessel disease changes. No acute intracranial abnormality. Specifically, no hemorrhage, hydrocephalus, mass lesion, acute infarction,  or significant intracranial injury. Vascular: No hyperdense vessel or unexpected calcification. Skull: No acute calvarial abnormality. Sinuses/Orbits: No acute findings Other: None IMPRESSION: Chronic left frontal infarct, stable. Atrophy, chronic microvascular disease. No acute intracranial abnormality. Electronically Signed   By: Rolm Baptise M.D.   On: 03/08/2022 03:32  ? ?ECHOCARDIOGRAM COMPLETE ? ?Result Date: 03/22/2022 ?   ECHOCARDIOGRAM REPORT   Patient Name:   Gary Odonnell Date of Exam: 03/05/2022 Medical Rec #:  [id number]      Height:       71.0 in Accession #:    [id number]     Weight:       166.0 lb Date of Birth:  1932/01/24     BSA:          1.948 m? Patient Age:    50 years       BP:           122/50 mmHg Patient Gender: M              HR:           77 bpm. Exam Location:  [facility] Procedure: 2D Echo, Cardiac Doppler and Color Doppler Indications:    Elevated Troponin  History:        Patient has prior history of Echocardiogram examinations, most                 recent 10/07/2020. CHF, CAD and Previous Myocardial Infarction,                 Prior CABG, PAD, COPD and Stroke, Arrythmias:Atrial                 Fibrillation; Risk Factors:Hypertension, Diabetes and Current                 Smoker. Lung CA. Images by Lonn Georgia, student.  Sonographer:    Wenda Low Referring Phys: [phone] Gary Odonnell IMPRESSIONS  1. Left ventricular ejection fraction, by estimation, is 30%. The left ventricle has moderate to severely decreased function. The left ventricle demonstrates regional wall motion abnormalities (see scoring diagram/findings for description). There is moderate left ventricular hypertrophy. Left ventricular diastolic parameters are indeterminate.  2. Ventricular septum flattens in systole suggesting RV pressure overload.. Right ventricular systolic function is moderately reduced. The right ventricular size is mildly enlarged. There is mildly elevated pulmonary artery systolic pressure.  3. Left  atrial size was mildly dilated.  4. Right atrial size was mildly dilated.  5. The mitral valve is abnormal. Mild mitral valve regurgitation. No evidence of mitral stenosis. Severe mitral annular calcification.  6. The aortic valve is tricuspid. There is moderate calcification of the aortic valve.  There is moderate thickening of the aortic valve. Aortic valve regurgitation is not visualized. Mild aortic valve stenosis.  7. The inferior vena cava is normal in size with greater than 50% respiratory variability, suggesting right atrial pressure of 3 mmHg. FINDINGS  Left Ventricle: Anteroseptal wall is akinetic. Inferior, inferoseptal and anterior walls are hypokinetic. Apex is hypokinetic. Left ventricular ejection fraction, by estimation, is 30%. The left ventricle has moderate to severely decreased function. The  left ventricle demonstrates regional wall motion abnormalities. The left ventricular internal cavity size was normal in size. There is moderate left ventricular hypertrophy. Left ventricular diastolic parameters are indeterminate. Right Ventricle: Ventricular septum flattens in systole suggesting RV pressure overload. The right ventricular size is mildly enlarged. Right vetricular wall thickness was not well visualized. Right ventricular systolic function is moderately reduced. There is mildly elevated pulmonary artery systolic pressure. The tricuspid regurgitant velocity is 3.00 m/s, and with an assumed right atrial pressure of 3 mmHg, the estimated right ventricular systolic pressure is 76.2 mmHg. Left Atrium: Left atrial size was mildly dilated. Right Atrium: Right atrial size was mildly dilated. Pericardium: There is no evidence of pericardial effusion. Mitral Valve: The mitral valve is abnormal. There is moderate thickening of the mitral valve leaflet(s). There is moderate calcification of the mitral valve leaflet(s). Severe mitral annular calcification. Mild mitral valve regurgitation. No evidence of  mitral valve stenosis. MV peak gradient, 23.7 mmHg. The mean mitral valve gradient is 16.0 mmHg. Tricuspid Valve: The tricuspid valve is not well visualized. Tricuspid valve regurgitation is mild . No evid

## 2022-02-27 NOTE — Progress Notes (Signed)
?PROGRESS NOTE ? ? ? ?Gary Odonnell  WPV:[id number] DOB: [birth year] DOA: 03/17/2022 ?PCP: Loman Brooklyn, FNP ? ? ?Brief Narrative:  ? ? ?Gary Odonnell is a 88 y.o. male with medical history significant of with history of atrial fibrillation, CHF-at baseline, COPD, CAD, HTN, HLD, PVD, CVA,  Diet-controlled DM II, presents the ED with a chief complaint of generalized weakness.  Patient reports he had generalized weakness for 2 days.  He had a fall as well.  He reports that his weakness is worse since it started.  He cannot get out of bed and he cannot get out of his chair.  He reports that he fell trying to get up.  Patient denies any pain.   ? He is on his baseline oxygen 3 L nasal cannula.  Patient reports that he has had dysuria and a significant decrease in urine output.  He also had diarrhea about 3 times in the last week.  He reports the diarrhea started 4 to days ago.  Patient reports he just does not know what is wrong, he feels so weak. ? ?Patient does not use drugs, does not drink alcohol.  He is a current smoker, half pack per day.  Patient is not interested in quitting.  Patient is not interested in a nicotine patch.  Patient is full code. ? ?-Patient was admitted with septic shock and noted to have organ dysfunction with AKI and elevated LFTs.  He was also noted to have troponin elevation and is now noted to have a drop in his LVEF on 2D echocardiogram.  He is also noted to have some ongoing diarrhea.  AKI is improving.  Cardiology planning for conservative management at this time.  ? ? ?Assessment & Plan: ?  ?Principal Problem: ?  Sepsis ([place]) ?Active Problems: ?  Elevated troponin ?  A-fib ([place]) ?  UTI (urinary tract infection) ?  Tobacco abuse ?  HTN (hypertension) ?  PAD (peripheral artery disease) ([place]) ?  Chronic obstructive pulmonary disease (HCC) ?  AKI (acute kidney injury) ([place]) ?  Diet-controlled diabetes mellitus ([place]) ?  Stage 3b chronic kidney disease ([place]) ?  Hyperkalemia ? ?Assessment  and Plan: ? ? ?Sepsis ([place]) ?-Patient met severe sepsis criteria with TM 100.7, RR 22, hypotensive BP 81/51, A on CKD, weighted LFTs, lactic acidosis 5.8, 4.1 ?  ?-Source of infection likely UTI, possibly pneumonia ?-O2 sats read as low as 70% the patient is on his baseline oxygen supplementation maintaining mid 90s oxygen sats ?  ?-Pneumonia is also on the differential with atelectasis versus developing infection on chest x-ray ?-Continue Rocephin and Zithromax started in the ED ?-Blood cultures with no growth thus far ?-Urine culture pending ?-Continue to monitor ?  ?Elevated troponin ?Diminished LVEF with wall motion abnormalities noted, but patient is not a candidate for cardiac catheterization ?Patient is already on aspirin and Eliquis ?Continue to monitor ?Denies any chest pain, subtle changes in EKG possible chronic ?Consult cardiology for evaluation appreciated with plans to resume low-dose Coreg 4/5 ?  ?UTI (urinary tract infection) ?- UA indicative of UTI ?-Urine culture pending ?-Rocephin started in the ED, continue Rocephin ?-Continue to monitor ?  ?A-fib ([place]) ?- Continue Eliquis ?-Holding beta-blocker secondary to soft blood pressures at this time ?  ?  ?Tobacco abuse ?- Counseled on the importance of cessation ?-Patient is not interested in quitting ?-Not interested in nicotine patch for cravings ?  ?  ?Stage 3b chronic kidney disease ([place]) ?- Estimated GFR is  usually around 45 ?-Today 30 with acute on chronic kidney disease ?-Creatinine (baseline 1.7)  2.09 >>>  ?-Avoid nephrotoxic agents when possible ?-Continue to monitor ?-Continue gentle IV fluid hydration ?  ?Diet-controlled diabetes mellitus ([place]) ?- Monitor blood glucose on chemistries ?  ?AKI (acute kidney injury) ([place]) ?- Creatinine at baseline 1.47 ?Currently improving ?Likely secondary to sepsis ?Continue IV hydration ?Avoid nephrotoxic agents when possible ?Holding further IV fluid at this point ?  ?Chronic obstructive pulmonary disease  (HCC) ?- Continue albuterol as needed ?-Continue Anoro ?-At baseline with 3 L of oxygen, satting 98% ?  ?  ?PAD (peripheral artery disease) ([place]) ?- Continue aspirin, Eliquis, patient is not on a statin ?  ?  ?HTN (hypertension) ?-Blood pressures improving slightly, resume home Coreg ?-Holding spironolactone ? ?Ongoing diarrhea ?-GI pathogen panel and C. difficile testing negative ?-Noted to start approximately 2 weeks ago ?-Started on Imodium scheduled ?-Tolerating diet ? ?Transaminitis ?-Likely secondary to septic shock ?-Continue to trend ?-Check ultrasound abdomen ?  ? ?DVT prophylaxis: Eliquis ?Code Status: Full ?Family Communication: None at bedside ?Disposition Plan:  ?Status is: Inpatient ?Remains inpatient appropriate because: Continues to require IV medications. ? ?Consultants:  ?Cardiology ? ?Procedures:  ?See below ? ?Antimicrobials:  ?Anti-infectives (From admission, onward)  ? ? Start     Dose/Rate Route Frequency Ordered Stop  ? 02/27/22 0000  cefTRIAXone (ROCEPHIN) 1 g in sodium chloride 0.9 % 100 mL IVPB       ? 1 g ?200 mL/hr over 30 Minutes Intravenous Every 24 hours 03/04/2022 0603    ? 03/01/2022 0800  azithromycin (ZITHROMAX) 500 mg in sodium chloride 0.9 % 250 mL IVPB  Status:  Discontinued       ? 500 mg ?250 mL/hr over 60 Minutes Intravenous Every 24 hours 02/28/2022 0753 03/07/2022 0804  ? 03/08/2022 0400  azithromycin (ZITHROMAX) 500 mg in sodium chloride 0.9 % 250 mL IVPB       ? 500 mg ?250 mL/hr over 60 Minutes Intravenous Every 24 hours 02/24/2022 0347    ? 03/04/2022 0245  cefTRIAXone (ROCEPHIN) 2 g in sodium chloride 0.9 % 100 mL IVPB       ? 2 g ?200 mL/hr over 30 Minutes Intravenous  Once 03/23/2022 0240 03/16/2022 9935  ? ?  ? ? ?Subjective: ?Patient seen and evaluated today with no new acute complaints or concerns. No acute concerns or events noted overnight.  He has less abdominal pain and tolerating his diet better.  He continues to have liquidy stool output. ? ?Objective: ?Vitals:  ? 02/27/22  0700 02/27/22 0815 02/27/22 0929 02/27/22 1100  ?BP: 120/64  123/68   ?Pulse: 66  84   ?Resp: 20     ?Temp:  98.9 ?F (37.2 ?C)  98.4 ?F (36.9 ?C)  ?TempSrc:  Axillary  Oral  ?SpO2: 100%     ?Weight:      ?Height:      ? ? ?Intake/Output Summary (Last 24 hours) at 02/27/2022 1353 ?Last data filed at 02/27/2022 0700 ?Gross per 24 hour  ?Intake 1198.16 ml  ?Output 300 ml  ?Net 898.16 ml  ? ?Filed Weights  ? 03/20/2022 0115 03/07/2022 0800 02/27/22 0500  ?Weight: 77 kg 75.3 kg 78 kg  ? ? ?Examination: ? ?General exam: Appears calm and comfortable  ?Respiratory system: Clear to auscultation. Respiratory effort normal.  Nasal cannula oxygen ?Cardiovascular system: S1 & S2 heard, RRR.  ?Gastrointestinal system: Abdomen is soft ?Central nervous system: Alert and awake ?Extremities: No edema ?  Skin: No significant lesions noted ?Psychiatry: Flat affect. ?Rectal bag with brown, liquidy stool output ? ? ? ?Data Reviewed: I have personally reviewed following labs and imaging studies ? ?CBC: ?Recent Labs  ?Lab 03/17/2022 ?0210 02/27/22 ?0402  ?WBC 8.2 7.4  ?NEUTROABS 6.1 5.9  ?HGB 10.7* 9.8*  ?HCT 35.8* 33.0*  ?MCV 97.3 96.2  ?PLT 129* 112*  ? ?Basic Metabolic Panel: ?Recent Labs  ?Lab 03/14/2022 ?0210 02/27/22 ?0402  ?NA 140 139  ?K 5.4* 3.9  ?CL 105 108  ?CO2 18* 22  ?GLUCOSE 116* 93  ?BUN 44* 43*  ?CREATININE 2.09* 1.73*  ?CALCIUM 8.8* 8.2*  ?MG  --  2.1  ? ?GFR: ?Estimated Creatinine Clearance: 32 mL/min (A) (by C-G formula based on SCr of 1.73 mg/dL (H)). ?Liver Function Tests: ?Recent Labs  ?Lab 03/11/2022 ?0210 02/27/22 ?0402  ?AST 365* 271*  ?ALT 171* 215*  ?ALKPHOS 128* 88  ?BILITOT 1.5* 0.8  ?PROT 6.8 5.4*  ?ALBUMIN 3.5 2.9*  ? ?No results for input(s): LIPASE, AMYLASE in the last 168 hours. ?Recent Labs  ?Lab 03/12/2022 ?0210  ?AMMONIA 22  ? ?Coagulation Profile: ?Recent Labs  ?Lab 03/24/2022 ?7867 02/27/22 ?0402  ?INR 2.3* 1.7*  ? ?Cardiac Enzymes: ?No results for input(s): CKTOTAL, CKMB, CKMBINDEX, TROPONINI in the last 168  hours. ?BNP (last 3 results) ?No results for input(s): PROBNP in the last 8760 hours. ?HbA1C: ?No results for input(s): HGBA1C in the last 72 hours. ?CBG: ?Recent Labs  ?Lab 03/16/2022 ?0151  ?GLUCAP 105*  ? ?Lipid

## 2022-02-28 DIAGNOSIS — R197 Diarrhea, unspecified: Secondary | ICD-10-CM | POA: Diagnosis not present

## 2022-02-28 DIAGNOSIS — R6520 Severe sepsis without septic shock: Secondary | ICD-10-CM | POA: Diagnosis not present

## 2022-02-28 DIAGNOSIS — I482 Chronic atrial fibrillation, unspecified: Secondary | ICD-10-CM | POA: Diagnosis not present

## 2022-02-28 DIAGNOSIS — N179 Acute kidney failure, unspecified: Secondary | ICD-10-CM | POA: Diagnosis not present

## 2022-02-28 DIAGNOSIS — R778 Other specified abnormalities of plasma proteins: Secondary | ICD-10-CM | POA: Diagnosis not present

## 2022-02-28 DIAGNOSIS — L8990 Pressure ulcer of unspecified site, unspecified stage: Secondary | ICD-10-CM | POA: Insufficient documentation

## 2022-02-28 DIAGNOSIS — A419 Sepsis, unspecified organism: Secondary | ICD-10-CM | POA: Diagnosis not present

## 2022-02-28 DIAGNOSIS — I739 Peripheral vascular disease, unspecified: Secondary | ICD-10-CM | POA: Diagnosis not present

## 2022-02-28 LAB — RETICULOCYTES
Immature Retic Fract: 13.1 % (ref 2.3–15.9)
RBC.: 3.32 MIL/uL — ABNORMAL LOW (ref 4.22–5.81)
Retic Count, Absolute: 48.1 10*3/uL (ref 19.0–186.0)
Retic Ct Pct: 1.5 % (ref 0.4–3.1)

## 2022-02-28 LAB — IRON AND TIBC
Iron: 14 ug/dL — ABNORMAL LOW (ref 45–182)
Saturation Ratios: 5 % — ABNORMAL LOW (ref 17.9–39.5)
TIBC: 263 ug/dL (ref 250–450)
UIBC: 249 ug/dL

## 2022-02-28 LAB — CBC
HCT: 31.9 % — ABNORMAL LOW (ref 39.0–52.0)
Hemoglobin: 9.6 g/dL — ABNORMAL LOW (ref 13.0–17.0)
MCH: 29 pg (ref 26.0–34.0)
MCHC: 30.1 g/dL (ref 30.0–36.0)
MCV: 96.4 fL (ref 80.0–100.0)
Platelets: 109 10*3/uL — ABNORMAL LOW (ref 150–400)
RBC: 3.31 MIL/uL — ABNORMAL LOW (ref 4.22–5.81)
RDW: 16.5 % — ABNORMAL HIGH (ref 11.5–15.5)
WBC: 6.1 10*3/uL (ref 4.0–10.5)
nRBC: 0.3 % — ABNORMAL HIGH (ref 0.0–0.2)

## 2022-02-28 LAB — COMPREHENSIVE METABOLIC PANEL
ALT: 183 U/L — ABNORMAL HIGH (ref 0–44)
AST: 145 U/L — ABNORMAL HIGH (ref 15–41)
Albumin: 2.7 g/dL — ABNORMAL LOW (ref 3.5–5.0)
Alkaline Phosphatase: 81 U/L (ref 38–126)
Anion gap: 10 (ref 5–15)
BUN: 37 mg/dL — ABNORMAL HIGH (ref 8–23)
CO2: 22 mmol/L (ref 22–32)
Calcium: 8.4 mg/dL — ABNORMAL LOW (ref 8.9–10.3)
Chloride: 106 mmol/L (ref 98–111)
Creatinine, Ser: 1.6 mg/dL — ABNORMAL HIGH (ref 0.61–1.24)
GFR, Estimated: 41 mL/min — ABNORMAL LOW (ref >60)
Glucose, Bld: 116 mg/dL — ABNORMAL HIGH (ref 70–99)
Potassium: 3.8 mmol/L (ref 3.5–5.1)
Sodium: 138 mmol/L (ref 135–145)
Total Bilirubin: 0.6 mg/dL (ref 0.3–1.2)
Total Protein: 5.5 g/dL — ABNORMAL LOW (ref 6.5–8.1)

## 2022-02-28 LAB — URINE CULTURE: Culture: 60000 — AB

## 2022-02-28 LAB — MAGNESIUM: Magnesium: 2.1 mg/dL (ref 1.7–2.4)

## 2022-02-28 LAB — VITAMIN B12: Vitamin B-12: 589 pg/mL (ref 180–914)

## 2022-02-28 LAB — FERRITIN: Ferritin: 306 ng/mL (ref 24–336)

## 2022-02-28 LAB — FOLATE: Folate: 17.7 ng/mL (ref >5.9)

## 2022-02-28 MED ORDER — FLUCONAZOLE 100 MG PO TABS
100.0000 mg | ORAL_TABLET | Freq: Every day | ORAL | Status: DC
  Administered 2022-02-28 – 2022-03-02 (×3): 100 mg via ORAL
  Filled 2022-02-28 (×3): qty 1

## 2022-02-28 MED ORDER — LACTATED RINGERS IV SOLN
INTRAVENOUS | Status: AC
Start: 2022-02-28 — End: 2022-02-28

## 2022-02-28 MED ORDER — CHLORHEXIDINE GLUCONATE CLOTH 2 % EX PADS
6.0000 | MEDICATED_PAD | Freq: Every day | CUTANEOUS | Status: DC
  Administered 2022-03-01: 6 via TOPICAL

## 2022-02-28 NOTE — Evaluation (Signed)
Physical Therapy Evaluation ?Patient Details ?Name: Gary Odonnell ?MRN: [id number] ?DOB: 1932/09/29 ?Today's Date: 02/28/2022 ? ?History of Present Illness ? Gary Odonnell is a 88 y.o. male with medical history significant of with history of atrial fibrillation, CHF-at baseline BMP today, COPD, coronary artery disease, essential hypertension, hyperlipidemia, peripheral artery disease, stroke, diet-controlled diabetes, and more presents the ED with a chief complaint of generalized weakness.  Patient reports he had generalized weakness for 2 days.  He had a fall as well.  He reports that his weakness is worse since it started.  He cannot get out of bed and he cannot get out of his chair.  He reports that he fell trying to get up.  Patient denies any pain.  He does not think he has had a fever, but reports that he does not have a thermometer.  He has not felt any more short of breath than his normal.  He is on his baseline oxygen 3 L nasal cannula.  Patient reports that he has had dysuria and a significant decrease in urine output.  He also had diarrhea about 3 times in the last week.  He reports the diarrhea started 4 to days ago.  Patient reports he just does not know what is wrong, he feels so weak. ?  ?Clinical Impression ? Patient demonstrates slow labored movement for sitting up at bedside requiring frequent rest breaks due to fatigue, very unsteady on feet, at high risk for falls and limited to a few slow labored side steps before having to sit due to generalized weakness.  Patient tolerated sitting up in chair after therapy with his spouse present in room - RN aware.  Patient will benefit from continued skilled physical therapy in hospital and recommended venue below to increase strength, balance, endurance for safe ADLs and gait.  ? ?   ?   ? ?Recommendations for follow up therapy are one component of a multi-disciplinary discharge planning process, led by the attending physician.  Recommendations may be  updated based on patient status, additional functional criteria and insurance authorization. ? ?Follow Up Recommendations Skilled nursing-short term rehab (<3 hours/day) ? ?  ?Assistance Recommended at Discharge Intermittent Supervision/Assistance  ?Patient can return home with the following ? A lot of help with bathing/dressing/bathroom;A lot of help with walking and/or transfers;Help with stairs or ramp for entrance;Assistance with cooking/housework ? ?  ?Equipment Recommendations None recommended by PT  ?Recommendations for Other Services ?    ?  ?Functional Status Assessment Patient has had a recent decline in their functional status and demonstrates the ability to make significant improvements in function in a reasonable and predictable amount of time.  ? ?  ?Precautions / Restrictions Precautions ?Precautions: Fall ?Restrictions ?Weight Bearing Restrictions: No  ? ?  ? ?Mobility ? Bed Mobility ?Overal bed mobility: Needs Assistance ?Bed Mobility: Supine to Sit ?  ?  ?Supine to sit: Mod assist ?  ?  ?General bed mobility comments: increased time, labored movement ?  ? ?Transfers ?Overall transfer level: Needs assistance ?Equipment used: Rolling walker (2 wheels) ?Transfers: Sit to/from Stand, Bed to chair/wheelchair/BSC ?Sit to Stand: Mod assist ?  ?Step pivot transfers: Mod assist ?  ?  ?  ?General transfer comment: unsteady labored movement with flexed trunk ?  ? ?Ambulation/Gait ?Ambulation/Gait assistance: Mod assist, Max assist ?Gait Distance (Feet): 5 Feet ?Assistive device: Rolling walker (2 wheels) ?Gait Pattern/deviations: Decreased step length - left, Decreased stance time - right, Decreased stride length, Trunk flexed, Knees  buckling ?Gait velocity: decreased ?  ?  ?General Gait Details: limited to a few slow labored side steps with flexed trunk and buckling of knees ? ?Stairs ?  ?  ?  ?  ?  ? ?Wheelchair Mobility ?  ? ?Modified Rankin (Stroke Patients Only) ?  ? ?  ? ?Balance Overall balance  assessment: Needs assistance ?Sitting-balance support: Feet supported, Bilateral upper extremity supported ?Sitting balance-Leahy Scale: Fair ?Sitting balance - Comments: had to support self with BUE due to generalized weakness ?  ?Standing balance support: During functional activity, Bilateral upper extremity supported ?Standing balance-Leahy Scale: Poor ?Standing balance comment: fair/poor using RW ?  ?  ?  ?  ?  ?  ?  ?  ?  ?  ?  ?   ? ? ? ?Pertinent Vitals/Pain Pain Assessment ?Pain Assessment: No/denies pain  ? ? ?Home Living Family/patient expects to be discharged to:: Private residence ?Living Arrangements: Spouse/significant other ?Available Help at Discharge: Family;Available 24 hours/day ?Type of Home: House ?Home Access: Ramped entrance ?  ?  ?  ?Home Layout: One level ?Home Equipment: Conservation officer, nature (2 wheels);Cane - single point;Grab bars - tub/shower;Shower seat;Wheelchair - manual ?   ?  ?Prior Function Prior Level of Function : Independent/Modified Independent ?  ?  ?  ?  ?  ?  ?Mobility Comments: household ambulator using RW ?ADLs Comments: assisted by family ?  ? ? ?Hand Dominance  ? Dominant Hand: Right ? ?  ?Extremity/Trunk Assessment  ? Upper Extremity Assessment ?Upper Extremity Assessment: Generalized weakness ?  ? ?Lower Extremity Assessment ?Lower Extremity Assessment: Generalized weakness ?  ? ?Cervical / Trunk Assessment ?Cervical / Trunk Assessment: Normal  ?Communication  ? Communication: No difficulties  ?Cognition Arousal/Alertness: Awake/alert ?Behavior During Therapy: [facility] for tasks assessed/performed ?Overall Cognitive Status: Within Functional Limits for tasks assessed ?  ?  ?  ?  ?  ?  ?  ?  ?  ?  ?  ?  ?  ?  ?  ?  ?  ?  ?  ? ?  ?General Comments   ? ?  ?Exercises    ? ?Assessment/Plan  ?  ?PT Assessment Patient needs continued PT services  ?PT Problem List Decreased strength;Decreased activity tolerance;Decreased balance;Decreased mobility ? ?   ?  ?PT Treatment Interventions DME  instruction;Gait training;Stair training;Functional mobility training;Therapeutic activities;Therapeutic exercise;Patient/family education;Balance training   ? ?PT Goals (Current goals can be found in the Care Plan section)  ?Acute Rehab PT Goals ?Patient Stated Goal: return home after rehab ?PT Goal Formulation: With patient/family ?Time For Goal Achievement: 03/14/22 ?Potential to Achieve Goals: Good ? ?  ?Frequency Min 3X/week ?  ? ? ?Co-evaluation   ?  ?  ?  ?  ? ? ?  ?AM-PAC PT "6 Clicks" Mobility  ?Outcome Measure Help needed turning from your back to your side while in a flat bed without using bedrails?: A Lot ?Help needed moving from lying on your back to sitting on the side of a flat bed without using bedrails?: A Lot ?Help needed moving to and from a bed to a chair (including a wheelchair)?: A Lot ?Help needed standing up from a chair using your arms (e.g., wheelchair or bedside chair)?: A Lot ?Help needed to walk in hospital room?: A Lot ?Help needed climbing 3-5 steps with a railing? : Total ?6 Click Score: 11 ? ?  ?End of Session Equipment Utilized During Treatment: Oxygen ?Activity Tolerance: Patient tolerated treatment well;Patient limited  by fatigue ?Patient left: in chair;with call bell/phone within reach ?Nurse Communication: Mobility status ?PT Visit Diagnosis: Unsteadiness on feet (R26.81);Other abnormalities of gait and mobility (R26.89);Muscle weakness (generalized) (M62.81) ?  ? ?Time: 1017-5102 ?PT Time Calculation (min) (ACUTE ONLY): 26 min ? ? ?Charges:   PT Evaluation ?$PT Eval Moderate Complexity: 1 Mod ?PT Treatments ?$Therapeutic Activity: 23-37 mins ?  ?   ? ? ?12:27 PM, 02/28/22 ?Lonell Grandchild, MPT ?Physical Therapist with [place] [facility] ?[facility] ?[phone] office ?[phone] mobile phone ? ? ?

## 2022-02-28 NOTE — Progress Notes (Signed)
Pharmacy Antibiotic Note ? ?Gary Odonnell is a 88 y.o. male admitted on 03/16/2022 with UTI.  Pharmacy has been consulted for fluconazole dosing. ? ?Plan: ?Fluconazole 100mg  po daily x 14 days ? ?Height: 5\' 11"  (180.3 cm) ?Weight: 79.3 kg (174 lb 13.2 oz) ?IBW/kg (Calculated) : 75.3 ? ?Temp (24hrs), Avg:98.2 ?F (36.8 ?C), Min:97.7 ?F (36.5 ?C), Max:98.8 ?F (37.1 ?C) ? ?Recent Labs  ?Lab 02/25/2022 ?0210 03/23/2022 ?0931 03/07/2022 ?1216 03/09/2022 ?2446 03/19/2022 ?1136 02/27/22 ?0402 02/28/22 ?0406  ?WBC 8.2  --   --   --   --  7.4 6.1  ?CREATININE 2.09*  --   --   --   --  1.73* 1.60*  ?LATICACIDVEN  --  5.8* 4.1* 2.8* 1.9  --   --   ?  ?Estimated Creatinine Clearance: 34.6 mL/min (A) (by C-G formula based on SCr of 1.6 mg/dL (H)).   ? ?Allergies  ?Allergen Reactions  ? Statins Other (See Comments)  ?  Intolerance per outpt Cardiology notes  ? Aldactone [Spironolactone] Diarrhea  ? ? ?Antimicrobials this admission: ?4/6 fluconazole >>  ?4/4 ceftriaxone >>  ?4/4 azithromycin >>  ? ?Microbiology results: ?4/4 BCx: NGTD ?4/4 UCx: 60,000 colonies yeast  ?4/4 MRSA PCR: negative  ? ?Thank you for allowing pharmacy to be a part of this patient?s care. ? ?Gary Odonnell ?02/28/2022 4:50 PM ? ?

## 2022-02-28 NOTE — TOC Progression Note (Addendum)
Transition of Care (TOC) - Progression Note  ? ? ?Patient Details  ?Name: Gary Odonnell ?MRN: [id number] ?Date of Birth: [birth year] ? ?Transition of Care (TOC) CM/SW Contact  ?Boneta Lucks, RN ?Phone Number: ?02/28/2022, 12:07 PM ? ?Clinical Narrative:   PT is recommending SNF. Patient getting a breathing treatment. Wife at the bedside, agreeing he needs SNF. Patient is not vaccinated. They are wanting a SNF in [place]. FL2 completed and sent out for bed offers. TOC to follow. ? ?Addendum: reviewed bed offers - Wife is accepting [facility] ? ?Expected Discharge Plan: [place] ?Barriers to Discharge: Continued Medical Work up ? ?Expected Discharge Plan and Services ?Expected Discharge Plan: [place] ?In-house Referral: Clinical Social Work ?  ?  ?Living arrangements for the past 2 months: [place] ?                ?    ?Readmission Risk Interventions ? ?  02/28/2022  ? 12:06 PM 02/27/2022  ?  3:13 PM  ?Readmission Risk Prevention Plan  ?Transportation Screening Complete Complete  ?PCP or Specialist Appt within 3-5 Days Not Complete   ?[place] or Home Care Consult Complete Complete  ?Social Work Consult for [place] Planning/Counseling Complete Complete  ?Palliative Care Screening Not Applicable Not Applicable  ?Medication Review Press photographer) Complete Complete  ? ? ?

## 2022-02-28 NOTE — Progress Notes (Signed)
?PROGRESS NOTE ? ? ? ?[name]  XBJ:[id number] DOB: 1932/06/30 DOA: 03/24/2022 ?PCP: Loman Brooklyn, FNP ? ? ?Brief Narrative:  ? ? ?Gary Odonnell is a 88 y.o. male with medical history significant of with history of atrial fibrillation, CHF-at baseline, COPD, CAD, HTN, HLD, PVD, CVA,  Diet-controlled DM II, presents the ED with a chief complaint of generalized weakness.  Patient reports he had generalized weakness for 2 days.  He had a fall as well.  He reports that his weakness is worse since it started.  He cannot get out of bed and he cannot get out of his chair.  He reports that he fell trying to get up.  Patient denies any pain.   ? He is on his baseline oxygen 3 L nasal cannula.  Patient reports that he has had dysuria and a significant decrease in urine output.  He also had diarrhea about 3 times in the last week.  He reports the diarrhea started 4 to days ago.  Patient reports he just does not know what is wrong, he feels so weak. ? ?Patient does not use drugs, does not drink alcohol.  He is a current smoker, half pack per day.  Patient is not interested in quitting.  Patient is not interested in a nicotine patch.  Patient is full code. ? ?-Patient was admitted with septic shock and noted to have organ dysfunction with AKI and elevated LFTs.  He was also noted to have troponin elevation and is now noted to have a drop in his LVEF on 2D echocardiogram.  He is also noted to have some ongoing diarrhea.  AKI is improving.  Cardiology planning for conservative management at this time.  ? ? ?Assessment & Plan: ?  ?Principal Problem: ?  Sepsis ([place]) ?Active Problems: ?  Elevated troponin ?  A-fib ([place]) ?  UTI (urinary tract infection) ?  Tobacco abuse ?  HTN (hypertension) ?  PAD (peripheral artery disease) ([place]) ?  Chronic obstructive pulmonary disease (HCC) ?  AKI (acute kidney injury) ([place]) ?  Diet-controlled diabetes mellitus ([place]) ?  Stage 3b chronic kidney disease ([place]) ?  Hyperkalemia ? ?Assessment  and Plan: ? ? ?Sepsis ([place]) ?-Patient met severe sepsis criteria with TM 100.7, RR 22, hypotensive BP 81/51, A on CKD, weighted LFTs, lactic acidosis 5.8, 4.1 ?  ?-Source of infection likely UTI, possibly pneumonia ?-O2 sats read as low as 70% the patient is on his baseline oxygen supplementation maintaining mid 90s oxygen sats ?  ?-Pneumonia is also on the differential with atelectasis versus developing infection on chest x-ray ?-Continue Rocephin and Zithromax started in the ED ?-Blood cultures with no growth thus far ?-Urine culture pending ?-Continue to monitor ?-Holding Aldactone and beta-blocker due to soft blood pressure readings, resume IV fluid ?  ?Elevated troponin ?Diminished LVEF with wall motion abnormalities noted, but patient is not a candidate for cardiac catheterization ?Patient is already on aspirin and Eliquis ?Continue to monitor ?Denies any chest pain, subtle changes in EKG possible chronic ?Consult cardiology for evaluation appreciated with plans to resume low-dose Coreg 4/5 ?  ?UTI (urinary tract infection) ?- UA indicative of UTI ?-Urine culture pending ?-Rocephin started in the ED, continue Rocephin ?-Continue to monitor ?  ?A-fib ([place]) ?- Continue Eliquis ?-Holding beta-blocker secondary to soft blood pressures at this time ?  ?  ?Tobacco abuse ?- Counseled on the importance of cessation ?-Patient is not interested in quitting ?-Not interested in nicotine patch for cravings ?  ?  ?  Stage 3b chronic kidney disease ([place]) ?- Estimated GFR is usually around 45 ?-Today 30 with acute on chronic kidney disease ?-Creatinine (baseline 1.7)  2.09 >>>  ?-Avoid nephrotoxic agents when possible ?-Continue to monitor ?-Continue gentle IV fluid hydration ?  ?Diet-controlled diabetes mellitus ([place]) ?- Monitor blood glucose on chemistries ?  ?AKI (acute kidney injury) ([place]) ?- Creatinine at baseline 1.47 ?Currently improving ?Likely secondary to sepsis ?Continue IV hydration ?Avoid nephrotoxic agents when  possible ?  ?Chronic obstructive pulmonary disease (HCC) ?- Continue albuterol as needed ?-Continue Anoro ?-At baseline with 3 L of oxygen, satting 98% ?  ?  ?PAD (peripheral artery disease) ([place]) ?- Continue aspirin, Eliquis, patient is not on a statin ?  ?  ?HTN (hypertension) ?-Blood pressures improving slightly, resume home Coreg ?-Holding spironolactone ?  ?Ongoing diarrhea ?-GI pathogen panel and C. difficile testing negative ?-Noted to start approximately 2 weeks ago ?-Started on Imodium scheduled without improvement ?-Tolerating diet without abdominal pain ?-Requested GI consultation 4/6 ?-IV fluid resume for diarrhea and soft blood pressures ?  ?Transaminitis-downtrending ?-Likely secondary to septic shock ?-Continue to trend ?-Ultrasound with some cholelithiasis, but no other significant findings noted. ?  ?  ?DVT prophylaxis: Eliquis ?Code Status: Full ?Family Communication: None at bedside ?Disposition Plan:  ?Status is: Inpatient ?Remains inpatient appropriate because: Continues to require IV medications. ?  ?Consultants:  ?Cardiology ?GI ?  ?Procedures:  ?See below ?  ?Antimicrobials:  ?Anti-infectives (From admission, onward)  ? ? Start     Dose/Rate Route Frequency Ordered Stop  ? 02/27/22 0000  cefTRIAXone (ROCEPHIN) 1 g in sodium chloride 0.9 % 100 mL IVPB       ? 1 g ?200 mL/hr over 30 Minutes Intravenous Every 24 hours 03/15/2022 0603    ? 03/16/2022 0800  azithromycin (ZITHROMAX) 500 mg in sodium chloride 0.9 % 250 mL IVPB  Status:  Discontinued       ? 500 mg ?250 mL/hr over 60 Minutes Intravenous Every 24 hours 03/24/2022 0753 03/12/2022 0804  ? 02/27/2022 0400  azithromycin (ZITHROMAX) 500 mg in sodium chloride 0.9 % 250 mL IVPB       ? 500 mg ?250 mL/hr over 60 Minutes Intravenous Every 24 hours 02/23/2022 0347    ? 03/22/2022 0245  cefTRIAXone (ROCEPHIN) 2 g in sodium chloride 0.9 % 100 mL IVPB       ? 2 g ?200 mL/hr over 30 Minutes Intravenous  Once 03/01/2022 0240 03/01/2022 4010  ? ?   ? ?Subjective: ?Patient seen and evaluated today with no new acute complaints or concerns.  He continues to have ongoing liquidy stool output, but no abdominal pain.  He states that he is otherwise eating well.  Soft blood pressures noted this AM.  No acute concerns or events noted overnight. ? ?Objective: ?Vitals:  ? 02/28/22 0732 02/28/22 0813 02/28/22 2725 02/28/22 3664  ?BP:  (!) 100/55 (!) 102/44 (!) 102/44  ?Pulse:  (!) 54 61   ?Resp:  19    ?Temp:      ?TempSrc:      ?SpO2: 99% 96% 100%   ?Weight:      ?Height:      ? ? ?Intake/Output Summary (Last 24 hours) at 02/28/2022 1014 ?Last data filed at 02/28/2022 0441 ?Gross per 24 hour  ?Intake 30 ml  ?Output 700 ml  ?Net -670 ml  ? ?Filed Weights  ? 03/14/2022 0800 02/27/22 0500 02/28/22 0441  ?Weight: 75.3 kg 78 kg 79.3 kg  ? ? ?  Examination: ? ?General exam: Appears calm and comfortable  ?Respiratory system: Clear to auscultation. Respiratory effort normal.  Nasal cannula oxygen. ?Cardiovascular system: S1 & S2 heard, irregular. ?Gastrointestinal system: Abdomen is soft ?Central nervous system: Alert and awake ?Extremities: No edema ?Skin: No significant lesions noted ?Psychiatry: Flat affect. ?Rectal tube with brown, liquidy output noted ? ? ? ?Data Reviewed: I have personally reviewed following labs and imaging studies ? ?CBC: ?Recent Labs  ?Lab 03/01/2022 ?0210 02/27/22 ?0402 02/28/22 ?0406  ?WBC 8.2 7.4 6.1  ?NEUTROABS 6.1 5.9  --   ?HGB 10.7* 9.8* 9.6*  ?HCT 35.8* 33.0* 31.9*  ?MCV 97.3 96.2 96.4  ?PLT 129* 112* 109*  ? ?Basic Metabolic Panel: ?Recent Labs  ?Lab 03/14/2022 ?0210 02/27/22 ?0402 02/28/22 ?0406  ?NA 140 139 138  ?K 5.4* 3.9 3.8  ?CL 105 108 106  ?CO2 18* 22 22  ?GLUCOSE 116* 93 116*  ?BUN 44* 43* 37*  ?CREATININE 2.09* 1.73* 1.60*  ?CALCIUM 8.8* 8.2* 8.4*  ?MG  --  2.1 2.1  ? ?GFR: ?Estimated Creatinine Clearance: 34.6 mL/min (A) (by C-G formula based on SCr of 1.6 mg/dL (H)). ?Liver Function Tests: ?Recent Labs  ?Lab 03/01/2022 ?0210 02/27/22 ?0402  02/28/22 ?0406  ?AST 365* 271* 145*  ?ALT 171* 215* 183*  ?ALKPHOS 128* 88 81  ?BILITOT 1.5* 0.8 0.6  ?PROT 6.8 5.4* 5.5*  ?ALBUMIN 3.5 2.9* 2.7*  ? ?No results for input(s): LIPASE, AMYLASE in the last 168 hours. ?Recent Labs

## 2022-02-28 NOTE — NC FL2 (Signed)
?[place] MEDICAID FL2 LEVEL OF CARE SCREENING TOOL  ?  ? ?IDENTIFICATION  ?Patient Name: ?Gary Odonnell Birthdate: [birth year] Sex: male Admission Date (Current Location): ?03/22/2022  ?[place] and [place] Number: ? [place] and Address:  ?[place] [address], [place] ?     Provider Number: ?[id number]  ?Attending Physician Name and Address:  ?Manuella Ghazi, Pratik D, DO ? Relative Name and Phone Number:  ?Hassen Bruun spouse [phone] ?   ?Current Level of Care: ?Hospital Recommended Level of Care: ?[facility] [place] Prior Approval Number: ?  ? ?Date Approved/Denied: ?  PASRR Number: ?[id number] A ? ?Discharge Plan: ?SNF ?  ? ?Current Diagnoses: ?Patient Active Problem List  ? Diagnosis Date Noted  ? Sepsis ([place]) 03/09/2022  ? UTI (urinary tract infection) 02/25/2022  ? Hyperkalemia 03/10/2022  ? COPD with acute exacerbation ([place]) 09/06/2021  ? Acute respiratory failure with hypoxia ([place]) 09/04/2021  ? Acute on chronic combined systolic and diastolic CHF (congestive heart failure) ([place]) 10/06/2020  ? DM type 2 (diabetes mellitus, type 2) ([place]) 10/06/2020  ? CKD (chronic kidney disease) stage 3, GFR 30-59 ml/min (HCC) 10/06/2020  ? Abnormal TSH 09/10/2020  ? Diabetic nephropathy ([place])   ? History of lung cancer 06/09/2020  ? Stage 3b chronic kidney disease ([place]) 06/08/2020  ? Elevated troponin   ? History of non-ST elevation myocardial infarction (NSTEMI) 04/08/2020  ? AKI (acute kidney injury) ([place]) 01/10/2019  ? Diet-controlled diabetes mellitus ([place]) 01/10/2019  ? Chronic obstructive pulmonary disease ([place]) 01/05/2019  ? A-fib ([place]) 01/05/2019  ? Tobacco abuse   ? Left shoulder pain 09/01/2018  ? Iliac artery stenosis, left ([place]) 04/22/2015  ? PAD (peripheral artery disease) ([place]) 04/21/2015  ? Carpal tunnel syndrome, left 05/03/2014  ? HTN (hypertension) 09/26/2013  ? Chronic combined systolic and diastolic CHF (congestive heart failure) ([place]) 09/26/2013  ? History  of CVA (cerebrovascular accident) 09/26/2013  ? CAD (coronary atherosclerotic disease) 09/26/2013  ? S/P CABG x 4 09/26/2013  ? ? ?Orientation RESPIRATION BLADDER Height & Weight   ?  ?Self, Time, Situation, Place ? Normal External catheter Weight: 79.3 kg ?Height:  5\' 11"  (180.3 cm)  ?BEHAVIORAL SYMPTOMS/MOOD NEUROLOGICAL BOWEL NUTRITION STATUS  ?    Continent Diet (See Dc summary)  ?AMBULATORY STATUS COMMUNICATION OF NEEDS Skin   ?  Verbally Skin abrasions, Bruising ?  ?  ?  ?    ?     ?     ? ? ?Personal Care Assistance Level of Assistance  ?Bathing, Feeding, Dressing Bathing Assistance: Maximum assistance ?Feeding assistance: Limited assistance ?Dressing Assistance: Maximum assistance ?   ? ?Functional Limitations Info  ?Sight, Hearing, Speech Sight Info: Adequate ?Hearing Info: Adequate ?Speech Info: Adequate  ? ? ?SPECIAL CARE FACTORS FREQUENCY  ?PT (By licensed PT)   ?  ?PT Frequency: 5 times a week ?  ?  ?  ?  ?   ? ? ?Contractures Contractures Info: Not present  ? ? ?Additional Factors Info  ?Code Status Code Status Info: FULL ?  ?  ?  ?  ?   ? ?Current Medications (02/28/2022):  This is the current hospital active medication list ?Current Facility-Administered Medications  ?Medication Dose Route Frequency Provider Last Rate Last Admin  ? acetaminophen (TYLENOL) tablet 650 mg  650 mg Oral Q6H PRN Zierle-Ghosh, Asia B, DO      ? Or  ? acetaminophen (TYLENOL) suppository 650 mg  650 mg Rectal Q6H PRN Zierle-Ghosh, [place]  B, DO      ? albuterol (PROVENTIL) (2.5 MG/3ML) 0.083% nebulizer solution 2.5 mg  2.5 mg Nebulization Q6H PRN Zierle-Ghosh, Asia B, DO   2.5 mg at 02/27/22 2349  ? albuterol (PROVENTIL) (2.5 MG/3ML) 0.083% nebulizer solution 2.5 mg  2.5 mg Nebulization QID Manuella Ghazi, Pratik D, DO   2.5 mg at 02/28/22 1123  ? apixaban (ELIQUIS) tablet 2.5 mg  2.5 mg Oral BID Zierle-Ghosh, Asia B, DO   2.5 mg at 02/28/22 0912  ? aspirin EC tablet 81 mg  81 mg Oral Q breakfast Zierle-Ghosh, Asia B, DO   81 mg at 02/28/22  0900  ? azithromycin (ZITHROMAX) 500 mg in sodium chloride 0.9 % 250 mL IVPB  500 mg Intravenous Q24H Zierle-Ghosh, Asia B, DO 250 mL/hr at 02/28/22 0324 500 mg at 02/28/22 0324  ? cefTRIAXone (ROCEPHIN) 1 g in sodium chloride 0.9 % 100 mL IVPB  1 g Intravenous Q24H Zierle-Ghosh, Asia B, DO 200 mL/hr at 02/28/22 0011 1 g at 02/28/22 0011  ? Chlorhexidine Gluconate Cloth 2 % PADS 6 each  6 each Topical Q0600 Shahmehdi, Seyed A, MD   6 each at 02/27/22 0930  ? ezetimibe (ZETIA) tablet 10 mg  10 mg Oral Daily Zierle-Ghosh, Asia B, DO   10 mg at 02/28/22 0912  ? lactated ringers infusion   Intravenous Continuous Heath Lark D, DO 75 mL/hr at 02/28/22 0902 New Bag at 02/28/22 0902  ? lactobacillus (FLORANEX/LACTINEX) granules 1 g  1 g Oral TID WC Shahmehdi, Seyed A, MD   1 g at 02/28/22 0900  ? loperamide (IMODIUM) capsule 2 mg  2 mg Oral Q6H Shah, Pratik D, DO   2 mg at 02/28/22 0900  ? ondansetron (ZOFRAN) tablet 4 mg  4 mg Oral Q6H PRN Zierle-Ghosh, Asia B, DO      ? Or  ? ondansetron (ZOFRAN) injection 4 mg  4 mg Intravenous Q6H PRN Zierle-Ghosh, Asia B, DO      ? oxyCODONE (Oxy IR/ROXICODONE) immediate release tablet 5 mg  5 mg Oral Q4H PRN Zierle-Ghosh, Asia B, DO   5 mg at 02/27/22 2303  ? polyethylene glycol (MIRALAX / GLYCOLAX) packet 17 g  17 g Oral Daily PRN Zierle-Ghosh, Asia B, DO      ? umeclidinium-vilanterol (ANORO ELLIPTA) 62.5-25 MCG/ACT 1 puff  1 puff Inhalation Daily Zierle-Ghosh, Asia B, DO   1 puff at 02/28/22 0732  ? ? ? ?Discharge Medications: ?Please see discharge summary for a list of discharge medications. ? ?Relevant Imaging Results: ? ?Relevant Lab Results: ? ? ?Additional Information ?SS# [id number] ? ?Boneta Lucks, RN ? ? ? ? ?

## 2022-02-28 NOTE — Plan of Care (Signed)
?  Problem: Acute Rehab PT Goals(only PT should resolve) ?Goal: Pt Will Go Supine/Side To Sit ?Outcome: Progressing ?Flowsheets (Taken 02/28/2022 1228) ?Pt will go Supine/Side to Sit: with minimal assist ?Goal: Patient Will Transfer Sit To/From Stand ?Outcome: Progressing ?Flowsheets (Taken 02/28/2022 1228) ?Patient will transfer sit to/from stand: with minimal assist ?Goal: Pt Will Transfer Bed To Chair/Chair To Bed ?Outcome: Progressing ?Flowsheets (Taken 02/28/2022 1228) ?Pt will Transfer Bed to Chair/Chair to Bed: with min assist ?Goal: Pt Will Ambulate ?Outcome: Progressing ?Flowsheets (Taken 02/28/2022 1228) ?Pt will Ambulate: ? 25 feet ? with minimal assist ? with moderate assist ? with rolling walker ?  ?12:28 PM, 02/28/22 ?Lonell Grandchild, MPT ?Physical Therapist with [place] ?[facility] ?[phone] office ?[phone] mobile phone ? ?

## 2022-02-28 NOTE — Consult Note (Addendum)
? ?Gastroenterology Consult  ? ?Referring Provider: No ref. provider found ?Primary Care Physician:  Loman Brooklyn, FNP ?Primary Gastroenterologist:  Dr. Gala Romney (newly assigned), previously Dr. Oneida Alar.  ? ?Patient ID: Gary Odonnell; [id number]; October 17, 1932  ? ?Admit date: 03/02/2022 ? LOS: 2 days  ? ?Date of Consultation: 02/28/2022 ? ?Reason for Consultation:  diarrhea ? ?History of Present Illness  ? ?Gary Odonnell is a 88 y.o. year old male with a history of Afib on eliquis, CHF CAD s/p CABG , COPD, HTN, HLD, PAD, Stroke, Diabetes type II, and prior LLL lung cancer. He presented to the ED for weakness that resulted in a fall at home. GI consulted for further evaluation of recent acute diarrhea. ? ?Last colonoscopy 01/24/2015 with redundant left colon, three 6 mm sessile ascending colon polyps, one 3 mm sessile rectosigmoid polyp, one pedunculated rectosigmoid polyp, one 1.2 cm sessile rectal polyp and tattooed, rare diverticula in transverse colon, frequent diverticula in sigmoid colon with tortuosity, angulation, and redundant and muscular hypertrophy with luminal narrowing, normal terminal ileum. Pathology revealed tubular adenomas. Was recommended to repeat colonoscopy in 1-3 years.  ? ?Never had EGD per patient, no record in chart.  ? ?He has been on antibiotics for pneumonia and UTI while inpatient however diarrhea began prior to this. GI pathogen panel and C-diff negative thus far. Just started on imodium yesterday.  ? ?Consult: ?Patient with a significant medical and surgical history over the years however reports he had been feeling fairly well recently until the last couple of weeks. He and family have denied any recent fevers or sick contacts and no recent antibiotic use. He was notably given a 5 day prednisone taper after his last ED visit on 02/08/22 for COPD exacerbation. Diarrhea began about 2 weeks ago and he has been going about twice a day with sometimes being watery and sometimes mushy. He did  report he sometimes had accidents in his depends especially if he could not make it to the bathroom and may have had some intermittent black stools. He reports no abdominal pain, constipation, hematochezia, fevers, chills, N/V, chest pain, hematemesis. He has noted some weight loss and early satiety over the last couple of months but reports that he feels like his appetite is normal. His wife states that he is like "skin and bones" recently. He has noted feeling increasingly weak and fatigued and it has been hard for him to get out of his bed and his chair.  ? ? ?Past Medical History:  ?Diagnosis Date  ? Atrial fibrillation ([place])   ? CHF (congestive heart failure) ([place])   ? a. EF 40-45% in 2015, at EF 30-35% by echo in 12/2018  ? COPD (chronic obstructive pulmonary disease) ([place])   ? Coronary artery disease   ? a. s/p CABG with most recent cath in 2014 showing patent LIMA-LAD, occluded Radial to OM1-OM2-PDA and occluded RCA with L--> R collaterals  ? Diabetic nephropathy ([place])   ? Essential hypertension   ? Hyperlipidemia   ? PAD (peripheral artery disease) ([place])   ? Pneumonia 09/2013  ? Primary cancer of left lower lobe of lung ([place]) 02/02/2015  ? Radiation 02/2015  ? Spinal stenosis   ? Stroke [facility]) 11/2008  ? Type 2 diabetes mellitus ([place])   ? ? ?Past Surgical History:  ?Procedure Laterality Date  ? Angioplasty to Left femoral artery    ? CARPAL TUNNEL RELEASE Left 05/11/2014  ? Procedure: LEFT CARPAL TUNNEL RELEASE;  Surgeon: Carole Civil,  MD;  Location: [facility] ORS;  Service: Orthopedics;  Laterality: Left;  ? COLONOSCOPY N/A 01/24/2015  ? Procedure: COLONOSCOPY;  Surgeon: Danie Binder, MD;  Location: [facility] ENDO SUITE;  Service: Endoscopy;  Laterality: N/A;  130  ? CORONARY ARTERY BYPASS GRAFT    ? x4  ? FEMORAL-POPLITEAL BYPASS GRAFT Right 10/23/2015  ? Procedure: BYPASS GRAFT FEMORAL TO BELOW KNEE POPLITEAL ARTERY USING RIGHT NON-REVERSED GREATER SAPPHENOUS VEIN;  Surgeon: [name], MD;  Location: [facility];   Service: Vascular;  Laterality: Right;  ? INTRAOPERATIVE ARTERIOGRAM Right 10/23/2015  ? Procedure: INTRA OPERATIVE ARTERIOGRAM - RIGHT LOWER LEG;  Surgeon: [name], MD;  Location: [facility] OR;  Service: Vascular;  Laterality: Right;  ? KNEE ARTHROSCOPY Right   ? LEFT AND RIGHT HEART CATHETERIZATION WITH CORONARY ANGIOGRAM N/A 09/29/2013  ? Procedure: LEFT AND RIGHT HEART CATHETERIZATION WITH CORONARY ANGIOGRAM;  Surgeon: Burnell Blanks, MD;  Location: [facility] CATH LAB;  Service: Cardiovascular;  Laterality: N/A;  ? LEFT HEART CATH AND CORS/GRAFTS ANGIOGRAPHY N/A 04/11/2020  ? Procedure: LEFT HEART CATH AND CORS/GRAFTS ANGIOGRAPHY;  Surgeon: Burnell Blanks, MD;  Location: [facility] CV LAB;  Service: Cardiovascular;  Laterality: N/A;  ? PERIPHERAL VASCULAR CATHETERIZATION N/A 04/21/2015  ? Procedure: Abdominal Aortogram w/Lower Extremity;  Surgeon: [name], MD;  Location: [facility] CV LAB;  Service: Cardiovascular;  Laterality: N/A;  ? Stent to right femoral artery    ? TRIGGER FINGER RELEASE Bilateral   ? VEIN HARVEST Right 10/23/2015  ? Procedure: [place];  Surgeon: [name], MD;  Location: [facility];  Service: Vascular;  Laterality: Right;  ? ? ?Prior to Admission medications   ?Medication Sig Start Date End Date Taking? Authorizing Provider  ?acetaminophen (TYLENOL) 325 MG tablet Take 2 tablets (650 mg total) by mouth every 6 (six) hours as needed for mild pain or headache (or Fever >/= 101). 09/06/21  Yes Emokpae, Courage, MD  ?albuterol (PROVENTIL) (2.5 MG/3ML) 0.083% nebulizer solution Take 3 mLs (2.5 mg total) by nebulization every 6 (six) hours as needed for shortness of breath. 10/08/20  Yes Kathie Dike, MD  ?albuterol (VENTOLIN HFA) 108 (90 Base) MCG/ACT inhaler Inhale 2 puffs into the lungs every 6 (six) hours as needed for wheezing or shortness of breath. 04/11/20  Yes Debbe Odea, MD  ?apixaban (ELIQUIS) 2.5 MG TABS tablet Take 1 tablet  (2.5 mg total) by mouth 2 (two) times daily. 01/09/22  Yes BranchAlphonse Guild, MD  ?aspirin 81 MG EC tablet Take 1 tablet (81 mg total) by mouth daily with breakfast. 09/06/21  Yes Emokpae, Courage, MD  ?carvedilol (COREG) 6.25 MG tablet Take 1 tablet (6.25 mg total) by mouth 2 (two) times daily with a meal. 09/06/21  Yes Emokpae, Courage, MD  ?ezetimibe (ZETIA) 10 MG tablet Take 1 tablet (10 mg total) by mouth daily. 05/31/21  Yes BranchAlphonse Guild, MD  ?furosemide (LASIX) 40 MG tablet Take 1 tablet (40 mg total) by mouth daily. (MAY TAKE AN ADDITIONAL 1/2 TAB AS NEEDED FOR SWELLING) - 09/06/21  Yes Emokpae, Courage, MD  ?hydrALAZINE (APRESOLINE) 25 MG tablet TAKE 1 AND 1/2 TABLETS(37.5 MG) BY MOUTH THREE TIMES DAILY 10/05/21  Yes Branch, Alphonse Guild, MD  ?nitroGLYCERIN (NITROSTAT) 0.3 MG SL tablet Place 0.3 mg under the tongue every 5 (five) minutes as needed for chest pain.   Yes [provider]  ?sacubitril-valsartan (ENTRESTO) 49-51 MG Take 1 tablet by mouth 2 (two) times daily.  09/06/21  Yes Roxan Hockey, MD  ?predniSONE (DELTASONE) 50 MG tablet Take 1 tablet (50 mg total) by mouth daily with breakfast. ?Patient not taking: Reported on 04/28/9431 0/03/79   Delora Fuel, MD  ?umeclidinium-vilanterol [facility] ELLIPTA) 62.5-25 MCG/INH AEPB Inhale 1 puff into the lungs daily. ?Patient not taking: Reported on 03/07/2022 09/07/21   Roxan Hockey, MD  ? ? ?Current Facility-Administered Medications  ?Medication Dose Route Frequency Provider Last Rate Last Admin  ? acetaminophen (TYLENOL) tablet 650 mg  650 mg Oral Q6H PRN Zierle-Ghosh, Asia B, DO      ? Or  ? acetaminophen (TYLENOL) suppository 650 mg  650 mg Rectal Q6H PRN Zierle-Ghosh, Asia B, DO      ? albuterol (PROVENTIL) (2.5 MG/3ML) 0.083% nebulizer solution 2.5 mg  2.5 mg Nebulization Q6H PRN Zierle-Ghosh, Asia B, DO   2.5 mg at 02/27/22 2349  ? albuterol (PROVENTIL) (2.5 MG/3ML) 0.083% nebulizer solution 2.5 mg  2.5 mg Nebulization QID Manuella Ghazi, Pratik D,  DO   2.5 mg at 02/28/22 0732  ? apixaban (ELIQUIS) tablet 2.5 mg  2.5 mg Oral BID Zierle-Ghosh, Asia B, DO   2.5 mg at 02/28/22 4446  ? aspirin EC tablet 81 mg  81 mg Oral Q breakfast Zierle-Ghosh, [place] B,

## 2022-02-28 NOTE — Progress Notes (Signed)
Patient currently OOB up in chair at this time, PT reported that patient did well standing with walker & transferring to chair ?

## 2022-02-28 NOTE — Progress Notes (Signed)
? ?Progress Note ? ?Patient Name: Gary Odonnell ?Date of Encounter: 02/28/2022 ? ?[facility] Cardiologist: Carlyle Dolly, MD  ? ?Subjective  ? ?Feeling better than yesterday but continues to feel very weak. Has a productive cough and is wheezing this morning. No chest pain.  ? ?Inpatient Medications  ?  ?Scheduled Meds: ? albuterol  2.5 mg Nebulization QID  ? apixaban  2.5 mg Oral BID  ? aspirin EC  81 mg Oral Q breakfast  ? carvedilol  3.125 mg Oral BID WC  ? Chlorhexidine Gluconate Cloth  6 each Topical Q0600  ? ezetimibe  10 mg Oral Daily  ? lactobacillus  1 g Oral TID WC  ? loperamide  2 mg Oral Q6H  ? umeclidinium-vilanterol  1 puff Inhalation Daily  ? ?Continuous Infusions: ? azithromycin 500 mg (02/28/22 0324)  ? cefTRIAXone (ROCEPHIN)  IV 1 g (02/28/22 0011)  ? lactated ringers    ? ?PRN Meds: ?acetaminophen **OR** acetaminophen, albuterol, ondansetron **OR** ondansetron (ZOFRAN) IV, oxyCODONE, polyethylene glycol  ? ?Vital Signs  ?  ?Vitals:  ? 02/27/22 2331 02/27/22 2351 02/28/22 0441 02/28/22 0732  ?BP:      ?Pulse:      ?Resp:      ?Temp: 97.7 ?F (36.5 ?C)  97.8 ?F (36.6 ?C)   ?TempSrc: Oral  Oral   ?SpO2:  99%  99%  ?Weight:   79.3 kg   ?Height:      ? ? ?Intake/Output Summary (Last 24 hours) at 02/28/2022 0842 ?Last data filed at 02/28/2022 0441 ?Gross per 24 hour  ?Intake 30 ml  ?Output 700 ml  ?Net -670 ml  ? ? ?  02/28/2022  ?  4:41 AM 02/27/2022  ?  5:00 AM 03/10/2022  ?  8:00 AM  ?Last 3 Weights  ?Weight (lbs) 174 lb 13.2 oz 171 lb 15.3 oz 166 lb 0.1 oz  ?Weight (kg) 79.3 kg 78 kg 75.3 kg  ?   ? ?Telemetry  ?  ?Afib; one 3 beat run of NSVT - Personally Reviewed ? ?ECG  ?  ?No new tracing - Personally Reviewed ? ?Physical Exam  ? ?GEN: Sitting up in bed, comfortable, speaking in full sentences ?Neck: No JVD ?Cardiac: Irregular, 2/6 systolic murmur ?Respiratory: Diffuse wheezing ?GI: Soft, nontender, non-distended  ?MS: No edema; No deformity. ?Neuro:  Nonfocal  ?Psych: Normal affect  ? ?Labs  ?  ?High  Sensitivity Troponin:   ?Recent Labs  ?Lab 03/20/2022 ?0210 03/08/2022 ?0423  ?TROPONINIHS 535* 416*  ?   ?Chemistry ?Recent Labs  ?Lab 03/15/2022 ?0210 02/27/22 ?0402 02/28/22 ?0406  ?NA 140 139 138  ?K 5.4* 3.9 3.8  ?CL 105 108 106  ?CO2 18* 22 22  ?GLUCOSE 116* 93 116*  ?BUN 44* 43* 37*  ?CREATININE 2.09* 1.73* 1.60*  ?CALCIUM 8.8* 8.2* 8.4*  ?MG  --  2.1 2.1  ?PROT 6.8 5.4* 5.5*  ?ALBUMIN 3.5 2.9* 2.7*  ?AST 365* 271* 145*  ?ALT 171* 215* 183*  ?ALKPHOS 128* 88 81  ?BILITOT 1.5* 0.8 0.6  ?GFRNONAA 30* 38* 41*  ?ANIONGAP 17* 9 10  ?  ?Lipids No results for input(s): CHOL, TRIG, HDL, LABVLDL, LDLCALC, CHOLHDL in the last 168 hours.  ?Hematology ?Recent Labs  ?Lab 02/25/2022 ?0210 02/27/22 ?0402 02/28/22 ?0406  ?WBC 8.2 7.4 6.1  ?RBC 3.68* 3.43* 3.31*  ?HGB 10.7* 9.8* 9.6*  ?HCT 35.8* 33.0* 31.9*  ?MCV 97.3 96.2 96.4  ?MCH 29.1 28.6 29.0  ?MCHC 29.9* 29.7* 30.1  ?RDW 16.7*  16.4* 16.5*  ?PLT 129* 112* 109*  ? ?Thyroid No results for input(s): TSH, FREET4 in the last 168 hours.  ?BNP ?Recent Labs  ?Lab 03/20/2022 ?0210  ?BNP 1,448.0*  ?  ?DDimer No results for input(s): DDIMER in the last 168 hours.  ? ?Radiology  ?  ?ECHOCARDIOGRAM COMPLETE ? ?Result Date: 03/02/2022 ?   ECHOCARDIOGRAM REPORT   Patient Name:   Gary Odonnell Date of Exam: 03/17/2022 Medical Rec #:  [id number]      Height:       71.0 in Accession #:    [id number]     Weight:       166.0 lb Date of Birth:  1932-03-21     BSA:          1.948 m? Patient Age:    88 years       BP:           122/50 mmHg Patient Gender: M              HR:           77 bpm. Exam Location:  [facility] Procedure: 2D Echo, Cardiac Doppler and Color Doppler Indications:    Elevated Troponin  History:        Patient has prior history of Echocardiogram examinations, most                 recent 10/07/2020. CHF, CAD and Previous Myocardial Infarction,                 Prior CABG, PAD, COPD and Stroke, Arrythmias:Atrial                 Fibrillation; Risk Factors:Hypertension, Diabetes and Current                  Smoker. Lung CA. Images by Lonn Georgia, student.  Sonographer:    Wenda Low Referring Phys: [phone] SEYED A SHAHMEHDI IMPRESSIONS  1. Left ventricular ejection fraction, by estimation, is 30%. The left ventricle has moderate to severely decreased function. The left ventricle demonstrates regional wall motion abnormalities (see scoring diagram/findings for description). There is moderate left ventricular hypertrophy. Left ventricular diastolic parameters are indeterminate.  2. Ventricular septum flattens in systole suggesting RV pressure overload.. Right ventricular systolic function is moderately reduced. The right ventricular size is mildly enlarged. There is mildly elevated pulmonary artery systolic pressure.  3. Left atrial size was mildly dilated.  4. Right atrial size was mildly dilated.  5. The mitral valve is abnormal. Mild mitral valve regurgitation. No evidence of mitral stenosis. Severe mitral annular calcification.  6. The aortic valve is tricuspid. There is moderate calcification of the aortic valve. There is moderate thickening of the aortic valve. Aortic valve regurgitation is not visualized. Mild aortic valve stenosis.  7. The inferior vena cava is normal in size with greater than 50% respiratory variability, suggesting right atrial pressure of 3 mmHg. FINDINGS  Left Ventricle: Anteroseptal wall is akinetic. Inferior, inferoseptal and anterior walls are hypokinetic. Apex is hypokinetic. Left ventricular ejection fraction, by estimation, is 30%. The left ventricle has moderate to severely decreased function. The  left ventricle demonstrates regional wall motion abnormalities. The left ventricular internal cavity size was normal in size. There is moderate left ventricular hypertrophy. Left ventricular diastolic parameters are indeterminate. Right Ventricle: Ventricular septum flattens in systole suggesting RV pressure overload. The right ventricular size is mildly enlarged. Right  vetricular wall thickness was not well visualized. Right ventricular systolic function is  moderately reduced. There is mildly elevated pulmonary artery systolic pressure. The tricuspid regurgitant velocity is 3.00 m/s, and with an assumed right atrial pressure of 3 mmHg, the estimated right ventricular systolic pressure is 47.6 mmHg. Left Atrium: Left atrial size was mildly dilated. Right Atrium: Right atrial size was mildly dilated. Pericardium: There is no evidence of pericardial effusion. Mitral Valve: The mitral valve is abnormal. There is moderate thickening of the mitral valve leaflet(s). There is moderate calcification of the mitral valve leaflet(s). Severe mitral annular calcification. Mild mitral valve regurgitation. No evidence of mitral valve stenosis. MV peak gradient, 23.7 mmHg. The mean mitral valve gradient is 16.0 mmHg. Tricuspid Valve: The tricuspid valve is not well visualized. Tricuspid valve regurgitation is mild . No evidence of tricuspid stenosis. Aortic Valve: The aortic valve is tricuspid. There is moderate calcification of the aortic valve. There is moderate thickening of the aortic valve. There is moderate aortic valve annular calcification. Aortic valve regurgitation is not visualized. Mild aortic stenosis is present. Aortic valve mean gradient measures 7.3 mmHg. Aortic valve peak gradient measures 12.3 mmHg. Aortic valve area, by VTI measures 1.38 cm?. Pulmonic Valve: The pulmonic valve was not well visualized. Pulmonic valve regurgitation is not visualized. No evidence of pulmonic stenosis. Aorta: The aortic root is normal in size and structure. Venous: The inferior vena cava is normal in size with greater than 50% respiratory variability, suggesting right atrial pressure of 3 mmHg. IAS/Shunts: No atrial level shunt detected by color flow Doppler.  LEFT VENTRICLE PLAX 2D LVIDd:         5.00 cm LVIDs:         3.90 cm LV PW:         1.40 cm LV IVS:        1.20 cm LVOT diam:     1.80 cm LV  SV:         60 LV SV Index:   31 LVOT Area:     2.54 cm?  LV Volumes (MOD) LV vol d, MOD A2C: 121.0 ml LV vol d, MOD A4C: 63.3 ml LV vol s, MOD A2C: 77.9 ml LV vol s, MOD A4C: 39.1 ml LV SV MOD A2C:     43.1

## 2022-03-01 ENCOUNTER — Inpatient Hospital Stay (HOSPITAL_COMMUNITY): Payer: Medicare Other

## 2022-03-01 DIAGNOSIS — A419 Sepsis, unspecified organism: Secondary | ICD-10-CM | POA: Diagnosis not present

## 2022-03-01 DIAGNOSIS — N179 Acute kidney failure, unspecified: Secondary | ICD-10-CM | POA: Diagnosis not present

## 2022-03-01 DIAGNOSIS — R0602 Shortness of breath: Secondary | ICD-10-CM | POA: Diagnosis not present

## 2022-03-01 DIAGNOSIS — I4821 Permanent atrial fibrillation: Secondary | ICD-10-CM

## 2022-03-01 DIAGNOSIS — J90 Pleural effusion, not elsewhere classified: Secondary | ICD-10-CM | POA: Diagnosis not present

## 2022-03-01 DIAGNOSIS — R6520 Severe sepsis without septic shock: Secondary | ICD-10-CM | POA: Diagnosis not present

## 2022-03-01 DIAGNOSIS — I517 Cardiomegaly: Secondary | ICD-10-CM | POA: Diagnosis not present

## 2022-03-01 LAB — COMPREHENSIVE METABOLIC PANEL
ALT: 147 U/L — ABNORMAL HIGH (ref 0–44)
AST: 98 U/L — ABNORMAL HIGH (ref 15–41)
Albumin: 2.8 g/dL — ABNORMAL LOW (ref 3.5–5.0)
Alkaline Phosphatase: 77 U/L (ref 38–126)
Anion gap: 9 (ref 5–15)
BUN: 38 mg/dL — ABNORMAL HIGH (ref 8–23)
CO2: 22 mmol/L (ref 22–32)
Calcium: 8.4 mg/dL — ABNORMAL LOW (ref 8.9–10.3)
Chloride: 105 mmol/L (ref 98–111)
Creatinine, Ser: 1.7 mg/dL — ABNORMAL HIGH (ref 0.61–1.24)
GFR, Estimated: 39 mL/min — ABNORMAL LOW (ref >60)
Glucose, Bld: 121 mg/dL — ABNORMAL HIGH (ref 70–99)
Potassium: 4.3 mmol/L (ref 3.5–5.1)
Sodium: 136 mmol/L (ref 135–145)
Total Bilirubin: 0.5 mg/dL (ref 0.3–1.2)
Total Protein: 5.6 g/dL — ABNORMAL LOW (ref 6.5–8.1)

## 2022-03-01 LAB — CBC
HCT: 30.4 % — ABNORMAL LOW (ref 39.0–52.0)
Hemoglobin: 9.2 g/dL — ABNORMAL LOW (ref 13.0–17.0)
MCH: 28.6 pg (ref 26.0–34.0)
MCHC: 30.3 g/dL (ref 30.0–36.0)
MCV: 94.4 fL (ref 80.0–100.0)
Platelets: 110 10*3/uL — ABNORMAL LOW (ref 150–400)
RBC: 3.22 MIL/uL — ABNORMAL LOW (ref 4.22–5.81)
RDW: 16.5 % — ABNORMAL HIGH (ref 11.5–15.5)
WBC: 5.4 10*3/uL (ref 4.0–10.5)
nRBC: 0.7 % — ABNORMAL HIGH (ref 0.0–0.2)

## 2022-03-01 LAB — MAGNESIUM: Magnesium: 2.2 mg/dL (ref 1.7–2.4)

## 2022-03-01 MED ORDER — BISMUTH SUBSALICYLATE 262 MG PO CHEW
524.0000 mg | CHEWABLE_TABLET | Freq: Three times a day (TID) | ORAL | Status: DC
  Filled 2022-03-01 (×12): qty 2

## 2022-03-01 MED ORDER — CHLORHEXIDINE GLUCONATE 0.12 % MT SOLN
15.0000 mL | Freq: Two times a day (BID) | OROMUCOSAL | Status: DC
  Administered 2022-03-01 – 2022-03-02 (×4): 15 mL via OROMUCOSAL
  Filled 2022-03-01 (×4): qty 15

## 2022-03-01 MED ORDER — LOPERAMIDE HCL 2 MG PO CAPS
4.0000 mg | ORAL_CAPSULE | Freq: Four times a day (QID) | ORAL | Status: DC
  Administered 2022-03-01 – 2022-03-02 (×4): 4 mg via ORAL
  Filled 2022-03-01 (×4): qty 2

## 2022-03-01 MED ORDER — SODIUM CHLORIDE 0.9 % IV SOLN
250.0000 mL | INTRAVENOUS | Status: DC
  Administered 2022-03-01: 250 mL via INTRAVENOUS

## 2022-03-01 MED ORDER — SODIUM CHLORIDE 0.9 % IV BOLUS: 500.0000 mL | Freq: Once | INTRAVENOUS | Status: DC

## 2022-03-01 MED ORDER — ORAL CARE MOUTH RINSE
15.0000 mL | Freq: Two times a day (BID) | OROMUCOSAL | Status: DC
  Administered 2022-03-01 – 2022-03-02 (×4): 15 mL via OROMUCOSAL

## 2022-03-01 MED ORDER — NOREPINEPHRINE 4 MG/250ML-% IV SOLN
2.0000 ug/min | INTRAVENOUS | Status: DC
  Administered 2022-03-01: 1 ug/min via INTRAVENOUS
  Filled 2022-03-01: qty 250

## 2022-03-01 MED ORDER — POLYVINYL ALCOHOL 1.4 % OP SOLN: 1.0000 [drp] | OPHTHALMIC | Status: DC | PRN

## 2022-03-01 MED ORDER — BISMUTH SUBSALICYLATE 262 MG/15ML PO SUSP
30.0000 mL | Freq: Three times a day (TID) | ORAL | Status: DC
  Administered 2022-03-01 – 2022-03-02 (×5): 30 mL via ORAL
  Filled 2022-03-01: qty 118

## 2022-03-01 MED ORDER — FUROSEMIDE 10 MG/ML IJ SOLN
40.0000 mg | Freq: Once | INTRAMUSCULAR | Status: AC
  Administered 2022-03-01: 40 mg via INTRAVENOUS
  Filled 2022-03-01: qty 4

## 2022-03-01 NOTE — Plan of Care (Signed)
Patient requiring Levophed infusion this AM. BiPAP @ 35% following episode of respiratory distress earlier this AM. ? ? ?Problem: Fluid Volume: ?Goal: Hemodynamic stability will improve ?Outcome: Progressing ?  ?Problem: Clinical Measurements: ?Goal: Diagnostic test results will improve ?Outcome: Progressing ?Goal: Signs and symptoms of infection will decrease ?Outcome: Progressing ?  ?Problem: Respiratory: ?Goal: Ability to maintain adequate ventilation will improve ?Outcome: Progressing ?  ?Problem: Urinary Elimination: ?Goal: Signs and symptoms of infection will decrease ?Outcome: Progressing ?  ?Problem: Education: ?Goal: Knowledge of General Education information will improve ?Description: Including pain rating scale, medication(s)/side effects and non-pharmacologic comfort measures ?Outcome: Progressing ?  ?Problem: Health Behavior/Discharge Planning: ?Goal: Ability to manage health-related needs will improve ?Outcome: Progressing ?  ?Problem: Clinical Measurements: ?Goal: Ability to maintain clinical measurements within normal limits will improve ?Outcome: Progressing ?Goal: Will remain free from infection ?Outcome: Progressing ?Goal: Diagnostic test results will improve ?Outcome: Progressing ?Goal: Respiratory complications will improve ?Outcome: Progressing ?Goal: Cardiovascular complication will be avoided ?Outcome: Progressing ?  ?Problem: Activity: ?Goal: Risk for activity intolerance will decrease ?Outcome: Progressing ?  ?Problem: Nutrition: ?Goal: Adequate nutrition will be maintained ?Outcome: Progressing ?  ?Problem: Coping: ?Goal: Level of anxiety will decrease ?Outcome: Progressing ?  ?Problem: Elimination: ?Goal: Will not experience complications related to bowel motility ?Outcome: Progressing ?Goal: Will not experience complications related to urinary retention ?Outcome: Progressing ?  ?Problem: Pain Managment: ?Goal: General experience of comfort will improve ?Outcome: Progressing ?  ?Problem:  Safety: ?Goal: Ability to remain free from injury will improve ?Outcome: Progressing ?  ?Problem: Skin Integrity: ?Goal: Risk for impaired skin integrity will decrease ?Outcome: Progressing ?  ?

## 2022-03-01 NOTE — Progress Notes (Signed)
?PROGRESS NOTE ? ? ? ?Gary Odonnell  MRN:[id number] DOB: 05/31/1932 DOA: 03/05/2022 ?PCP: Joyce, Britney F, FNP ? ? ?Brief Narrative:  ? ? ?Gary Odonnell is a 89 y.o. male with medical history significant of with history of atrial fibrillation, CHF-at baseline, COPD, CAD, HTN, HLD, PVD, CVA,  Diet-controlled DM II, presents the ED with a chief complaint of generalized weakness.  Patient reports he had generalized weakness for 2 days.  He had a fall as well.  He reports that his weakness is worse since it started.  He cannot get out of bed and he cannot get out of his chair.  He reports that he fell trying to get up.  Patient denies any pain.   ? He is on his baseline oxygen 3 L nasal cannula.  Patient reports that he has had dysuria and a significant decrease in urine output.  He also had diarrhea about 3 times in the last week.  He reports the diarrhea started 4 to days ago.  Patient reports he just does not know what is wrong, he feels so weak. ? ?Patient does not use drugs, does not drink alcohol.  He is a current smoker, half pack per day.  Patient is not interested in quitting.  Patient is not interested in a nicotine patch.  Patient is full code. ? ?-Patient was admitted with septic shock and noted to have organ dysfunction with AKI and elevated LFTs.  He was also noted to have troponin elevation and is now noted to have a drop in his LVEF on 2D echocardiogram.  He is also noted to have some ongoing diarrhea.  AKI is improving.  Cardiology planning for conservative management at this time.  He is not requiring low-dose pressors and has had some tachypnea requiring BiPAP overnight.  ? ? ?Assessment & Plan: ?  ?Principal Problem: ?  Sepsis (HCC) ?Active Problems: ?  Elevated troponin ?  A-fib (HCC) ?  UTI (urinary tract infection) ?  Tobacco abuse ?  HTN (hypertension) ?  PAD (peripheral artery disease) (HCC) ?  Chronic obstructive pulmonary disease (HCC) ?  AKI (acute kidney injury) (HCC) ?  Diet-controlled  diabetes mellitus (HCC) ?  Stage 3b chronic kidney disease (HCC) ?  Hyperkalemia ?  Pressure injury of skin ?  Diarrhea ? ?Assessment and Plan: ? ? ?Sepsis (HCC) ?-Patient met severe sepsis criteria with TM 100.7, RR 22, hypotensive BP 81/51, A on CKD, weighted LFTs, lactic acidosis 5.8, 4.1; currently with low BP readings requiring norepinephrine-but not due to sepsis ?  ?-Source of infection likely UTI, possibly pneumonia ?-O2 sats read as low as 70% the patient is on his baseline oxygen supplementation maintaining mid 90s oxygen sats ?  ?-Pneumonia is also on the differential with atelectasis versus developing infection on chest x-ray ?-Continue Rocephin and Zithromax started in the ED ?-Blood cultures with no growth thus far ?-Urine culture with yeast and patient started on Diflucan ?-Continue to monitor ?-Holding Aldactone and beta-blocker due to soft blood pressure readings, resume IV fluid ?  ?Elevated troponin ?Diminished LVEF with wall motion abnormalities noted, but patient is not a candidate for cardiac catheterization ?Patient is already on aspirin and Eliquis ?Continue to monitor ?Denies any chest pain, subtle changes in EKG possible chronic ?Consult cardiology for evaluation appreciated with plans to resume low-dose Coreg 4/5 ? ?Acute systolic CHF exacerbation ?-Seen on chest x-ray from overnight now requiring BiPAP ?-Appreciate cardiology recommendations ?-Avoid diuretics given low blood pressure readings and ongoing diarrhea ?-  Continue to monitor closely ?  ?UTI (urinary tract infection) ?- UA indicative of UTI ?-Urine culture pending ?-Rocephin started in the ED, continue Rocephin ?-Continue to monitor ?  ?A-fib (HCC) ?- Continue Eliquis ?-Holding beta-blocker secondary to soft blood pressures at this time ?  ?  ?Tobacco abuse ?- Counseled on the importance of cessation ?-Patient is not interested in quitting ?-Not interested in nicotine patch for cravings ?  ?  ?Stage 3b chronic kidney disease  (HCC) ?- Estimated GFR is usually around 45 ?-Today 30 with acute on chronic kidney disease ?-Creatinine (baseline 1.4)  ?-Avoid nephrotoxic agents when possible ?-Continue to monitor ?-Continue gentle IV fluid hydration ?  ?Diet-controlled diabetes mellitus (HCC) ?- Monitor blood glucose on chemistries ?  ?AKI (acute kidney injury) (HCC) ?- Creatinine at baseline 1.47 ?Currently stable ?Likely secondary to sepsis ?Hold further IV hydration given volume overload and CHF on chest x-ray ?Avoid nephrotoxic agents when possible ?  ?Chronic obstructive pulmonary disease (HCC) ?- Continue albuterol as needed ?-Continue Anoro ?-At baseline with 3 L of oxygen, satting 98% ?  ?  ?PAD (peripheral artery disease) (HCC) ?- Continue aspirin, Eliquis, patient is not on a statin ?  ?  ?HTN (hypertension) ?-Holding BP medications as patient currently has hypotension overnight on 4/6 ?-Started on norepinephrine ?  ?Ongoing diarrhea ?-GI pathogen panel and C. difficile testing negative ?-Noted to start approximately 2 weeks ago ?-Started on Imodium scheduled without improvement ?-Tolerating diet without abdominal pain ?-Requested GI consultation 4/6 ?-Increase loperamide to 4 mg every 6 hours and added bismuth 2 tablets every 6 hours per GI recommendations ?  ?Transaminitis-downtrending ?-Likely secondary to septic shock ?-Continue to trend ?-Ultrasound with some cholelithiasis, but no other significant findings noted. ?  ?  ?DVT prophylaxis: Eliquis ?Code Status: Full ?Family Communication: Discussed with daughter on phone 4/6 ?Disposition Plan:  ?Status is: Inpatient ?Remains inpatient appropriate because: Continues to require IV medications. ?  ?Consultants:  ?Cardiology ?GI ?  ?Procedures:  ?See below ?  ?Antimicrobials:  ?Anti-infectives (From admission, onward)  ? ? Start     Dose/Rate Route Frequency Ordered Stop  ? 02/28/22 1745  fluconazole (DIFLUCAN) tablet 100 mg       ? 100 mg Oral Daily 02/28/22 1650 03/14/22 0959  ?  02/27/22 0000  cefTRIAXone (ROCEPHIN) 1 g in sodium chloride 0.9 % 100 mL IVPB       ? 1 g ?200 mL/hr over 30 Minutes Intravenous Every 24 hours 02/23/2022 0603    ? 03/11/2022 0800  azithromycin (ZITHROMAX) 500 mg in sodium chloride 0.9 % 250 mL IVPB  Status:  Discontinued       ? 500 mg ?250 mL/hr over 60 Minutes Intravenous Every 24 hours 03/20/2022 0753 02/25/2022 0804  ? 02/23/2022 0400  azithromycin (ZITHROMAX) 500 mg in sodium chloride 0.9 % 250 mL IVPB       ? 500 mg ?250 mL/hr over 60 Minutes Intravenous Every 24 hours 02/24/2022 0347    ? 03/06/2022 0245  cefTRIAXone (ROCEPHIN) 2 g in sodium chloride 0.9 % 100 mL IVPB       ? 2 g ?200 mL/hr over 30 Minutes Intravenous  Once 03/17/2022 0240 03/01/2022 0609  ? ?  ? ?Subjective: ?Patient seen and evaluated today and was noted to have some respiratory distress earlier this morning requiring placement of BiPAP.  He was also noted to become hypotensive requiring initiation of norepinephrine.  He denies any pain complaints and continues to have ongoing diarrhea. ? ?Objective: ?  Vitals:  ? 03/01/22 1230 03/01/22 1245 03/01/22 1300 03/01/22 1315  ?BP: 103/64 99/60 102/60 (!) 116/47  ?Pulse: (!) 44 60 (!) 59 63  ?Resp: (!) 27 (!) 23 (!) 22 (!) 21  ?Temp:      ?TempSrc:      ?SpO2: (!) 88%  91% 93%  ?Weight:      ?Height:      ? ? ?Intake/Output Summary (Last 24 hours) at 03/01/2022 1419 ?Last data filed at 03/01/2022 1300 ?Gross per 24 hour  ?Intake 1677.3 ml  ?Output 0 ml  ?Net 1677.3 ml  ? ?Filed Weights  ? 03/10/2022 0800 02/27/22 0500 02/28/22 0441  ?Weight: 75.3 kg 78 kg 79.3 kg  ? ? ?Examination: ? ?General exam: Appears calm and comfortable  ?Respiratory system: Clear to auscultation. Respiratory effort normal.  Currently on BiPAP ?Cardiovascular system: S1 & S2 heard, RRR.  ?Gastrointestinal system: Abdomen is soft ?Central nervous system: Alert and awake ?Extremities: No edema ?Skin: No significant lesions noted ?Psychiatry: Flat affect. ?Rectal tube with dark liquid stool  output ? ? ? ?Data Reviewed: I have personally reviewed following labs and imaging studies ? ?CBC: ?Recent Labs  ?Lab 03/07/2022 ?0210 02/27/22 ?0402 02/28/22 ?0406 03/01/22 ?0402  ?WBC 8.2 7.4 6.1 5.4  ?NEUTROABS 6.1 5.

## 2022-03-01 NOTE — Progress Notes (Signed)
? ?Progress Note ? ?Patient Name: Gary Odonnell ?Date of Encounter: 03/01/2022 ? ?[facility] Cardiologist: Carlyle Dolly, MD  ? ?Subjective  ? ?Patient says his breathing is OK   Denies CP   Very hungry   Wants to eat ? ?Inpatient Medications  ?  ?Scheduled Meds: ? albuterol  2.5 mg Nebulization QID  ? apixaban  2.5 mg Oral BID  ? aspirin EC  81 mg Oral Q breakfast  ? chlorhexidine  15 mL Mouth Rinse BID  ? Chlorhexidine Gluconate Cloth  6 each Topical Q0600  ? ezetimibe  10 mg Oral Daily  ? fluconazole  100 mg Oral Daily  ? lactobacillus  1 g Oral TID WC  ? loperamide  2 mg Oral Q6H  ? mouth rinse  15 mL Mouth Rinse q12n4p  ? umeclidinium-vilanterol  1 puff Inhalation Daily  ? ?Continuous Infusions: ? sodium chloride 10 mL/hr at 03/01/22 1000  ? azithromycin Stopped (03/01/22 0511)  ? cefTRIAXone (ROCEPHIN)  IV Stopped (03/01/22 0133)  ? norepinephrine (LEVOPHED) Adult infusion 2 mcg/min (03/01/22 1000)  ? sodium chloride    ? ?PRN Meds: ?acetaminophen **OR** acetaminophen, albuterol, ondansetron **OR** ondansetron (ZOFRAN) IV, oxyCODONE, polyethylene glycol  ? ?Vital Signs  ?  ?Vitals:  ? 03/01/22 0935 03/01/22 0945 03/01/22 1000 03/01/22 1015  ?BP:  130/71 123/78 (!) 110/45  ?Pulse: 91 65 (!) 58   ?Resp: 12 (!) 22 17 (!) 21  ?Temp:      ?TempSrc:      ?SpO2: 100% 96% 96% 98%  ?Weight:      ?Height:      ? ? ?Intake/Output Summary (Last 24 hours) at 03/01/2022 1043 ?Last data filed at 03/01/2022 1000 ?Gross per 24 hour  ?Intake 1631.18 ml  ?Output 0 ml  ?Net 1631.18 ml  ? ?Net positive 3.3 L  ? ? ? ?  02/28/2022  ?  4:41 AM 02/27/2022  ?  5:00 AM 03/10/2022  ?  8:00 AM  ?Last 3 Weights  ?Weight (lbs) 174 lb 13.2 oz 171 lb 15.3 oz 166 lb 0.1 oz  ?Weight (kg) 79.3 kg 78 kg 75.3 kg  ?   ? ?Telemetry  ?  ?Afib   100- Personally Reviewed ? ?ECG  ?  ?No new tracing - Personally Reviewed ? ?Physical Exam  ? ?GEN: Sitting up in bed, comfortable, speaking in full sentences ?Neck: No JVD ?Cardiac: Irregular, 2/6 systolic  murmur ?Respiratory: Rhonchi.  Mild wheezing Rales at bases  ?GI: Soft, nontender, non-distended  ?MS: No siginficant  edema;   Feet warm  No deformity. ?Neuro:  Nonfocal  ?Psych: Normal affect  ? ?Labs  ?  ?High Sensitivity Troponin:   ?Recent Labs  ?Lab 03/02/2022 ?0210 02/27/2022 ?0423  ?TROPONINIHS 535* 416*  ?   ?Chemistry ?Recent Labs  ?Lab 02/27/22 ?0402 02/28/22 ?0406 03/01/22 ?0402  ?NA 139 138 136  ?K 3.9 3.8 4.3  ?CL 108 106 105  ?CO2 22 22 22   ?GLUCOSE 93 116* 121*  ?BUN 43* 37* 38*  ?CREATININE 1.73* 1.60* 1.70*  ?CALCIUM 8.2* 8.4* 8.4*  ?MG 2.1 2.1 2.2  ?PROT 5.4* 5.5* 5.6*  ?ALBUMIN 2.9* 2.7* 2.8*  ?AST 271* 145* 98*  ?ALT 215* 183* 147*  ?ALKPHOS 88 81 77  ?BILITOT 0.8 0.6 0.5  ?GFRNONAA 38* 41* 39*  ?ANIONGAP 9 10 9   ?  ?Lipids No results for input(s): CHOL, TRIG, HDL, LABVLDL, LDLCALC, CHOLHDL in the last 168 hours.  ?Hematology ?Recent Labs  ?Lab 02/27/22 ?9924  02/28/22 ?0406 02/28/22 ?1346 03/01/22 ?0402  ?WBC 7.4 6.1  --  5.4  ?RBC 3.43* 3.31* 3.32* 3.22*  ?HGB 9.8* 9.6*  --  9.2*  ?HCT 33.0* 31.9*  --  30.4*  ?MCV 96.2 96.4  --  94.4  ?MCH 28.6 29.0  --  28.6  ?MCHC 29.7* 30.1  --  30.3  ?RDW 16.4* 16.5*  --  16.5*  ?PLT 112* 109*  --  110*  ? ?Thyroid No results for input(s): TSH, FREET4 in the last 168 hours.  ?BNP ?Recent Labs  ?Lab 03/20/2022 ?0210  ?BNP 1,448.0*  ?  ?DDimer No results for input(s): DDIMER in the last 168 hours.  ? ?Radiology  ?  ?DG Chest 1 View ? ?Result Date: 03/01/2022 ?CLINICAL DATA:  Increased shortness of breath EXAM: CHEST  1 VIEW COMPARISON:  03/22/2022 FINDINGS: Cardiomegaly. Diffuse interstitial coarsening with small pleural effusions and cephalized blood flow. Prior median sternotomy. No pneumothorax. IMPRESSION: CHF pattern. Electronically Signed   By: Jorje Guild M.D.   On: 03/01/2022 06:47  ? ?US Abdomen Limited RUQ (LIVER/GB) ? ?Result Date: 02/27/2022 ?CLINICAL DATA:  Transaminitis. EXAM: ULTRASOUND ABDOMEN LIMITED RIGHT UPPER QUADRANT COMPARISON:  None.  FINDINGS: Gallbladder: Gallbladder is nondistended, which limits evaluation. Several gallstones with acoustic shadowing are seen measuring up to 1 cm in size. Gallbladder wall thickness cannot be accurately measured due to nondistended state, however there is no evidence of sonographic Murphy sign or pericholecystic fluid. Common bile duct: Diameter: 5 mm, within normal limits. Liver: Mildly increased echogenicity of the hepatic parenchyma, consistent with hepatic steatosis. No hepatic mass identified. Portal vein is patent on color Doppler imaging with normal direction of blood flow towards the liver. Other: Mild right upper quadrant ascites and right pleural effusion incidentally noted. A large right renal cyst is also seen measuring approximately 8.5 cm. IMPRESSION: Cholelithiasis. No sonographic signs of acute cholecystitis or biliary ductal dilatation. Mild diffuse hepatic steatosis. Mild right upper quadrant ascites and right pleural effusion. Right renal cyst incidentally noted. Electronically Signed   By: Marlaine Hind M.D.   On: 02/27/2022 14:28   ? ?Cardiac Studies  ? ?   ?  ?09/27/13 Echo   ?LVEF 15-20%, moderate LVH, severe diffuse hypokinesis, restrictive diastolic dysfunction, multiple WMAs, moderate MR, severe RV dysfunction RV TAPSE 0.7 cm, PASP 61 mmHg.   ?  ? ?04/2014 Echo ?Study Conclusions ? ?- Left ventricle: The cavity size was normal. Wall thickness was ?increased in a pattern of mild LVH. Systolic function was mildly ?to moderately reduced. The estimated ejection fraction was in the ?range of 40% to 45%. There is diastolic dysfunction, ?indeterminant grade. ?- Regional wall motion abnormality: Hypokinesis of the mid anterior ?and basal-mid anteroseptal myocardium. ?- Aortic valve: Moderately calcified annulus. Trileaflet; ?moderately thickened leaflets. Valve area (VTI): 2.48 cm^2. Valve ?area (Vmax): 2.23 cm^2. ?- Mitral valve: Moderately calcified annulus. Mildly thickened ?leaflets . ?- Left  atrium: The atrium was moderately dilated. ?- Right atrium: The atrium was mildly dilated. ?- Technically adequate study. ?  ?  ?01/2015 Carotid US ?bilaterall 1-39% ?  ?  ?  ?01/2018 echo ?Study Conclusions ?  ?- Left ventricle: The cavity size was normal. Wall thickness was ?  increased in a pattern of moderate LVH. Systolic function was ?  moderately reduced. The estimated ejection fraction was in the ?  range of 35% to 40%. Diffuse hypokinesis. Features are consistent ?  with a pseudonormal left ventricular filling pattern, with ?  concomitant abnormal relaxation  and increased filling pressure ?  (grade 2 diastolic dysfunction). Doppler parameters are ?  consistent with high ventricular filling pressure. ?- Regional wall motion abnormality: Moderate hypokinesis of the ?  basal-mid anteroseptal, mid inferoseptal, and mid inferior ?  myocardium. ?- Aortic valve: Moderately calcified annulus. Mildly thickened, ?  mildly calcified leaflets. There appears to be at least mild (if ?  not mild to moderate) calcific aortic valvular stenosis. I ?  suspect peak velocity and mean gradient is underestimated due to ?  depressed LV function. Peak velocity (S): 159 cm/s. Mean gradient ?  (S): 6 mm Hg. Valve area (VTI): 1.66 cm^2. Valve area (Vmax): ?  1.73 cm^2. Valve area (Vmean): 1.52 cm^2. ?- Mitral valve: Calcified annulus. Restricted leaflet motion due to ?  depressed LV function. There was mild regurgitation. Valve area ?  by continuity equation (using LVOT flow): 1.21 cm^2. ?- Left atrium: The atrium was mildly dilated. ?- Right ventricle: Systolic function was moderately reduced. ?- Tricuspid valve: There was mild regurgitation ?  ?03/2020 cath ?Prox RCA lesion is 100% stenosed. ?Prox LAD lesion is 100% stenosed. ?LIMA graft was visualized by angiography and is normal in caliber. ?Dist Cx lesion is 90% stenosed. ?Ost Cx to Prox Cx lesion is 70% stenosed. ?Lat Ramus lesion is 70% stenosed. ?Ramus lesion is 70% stenosed. ?   ?1. Severe triple vessel CAD s/p 4V CABG with 1/4 patent grafts.  ?2. Occluded proximal LAD. Patent LIMA to LAD ?3. Moderately severe proximal Circumflex stenosis, unchanged from last cath. Severe distal Circumfle

## 2022-03-02 ENCOUNTER — Inpatient Hospital Stay (HOSPITAL_COMMUNITY): Payer: Medicare Other

## 2022-03-02 DIAGNOSIS — R0602 Shortness of breath: Secondary | ICD-10-CM | POA: Diagnosis not present

## 2022-03-02 DIAGNOSIS — I517 Cardiomegaly: Secondary | ICD-10-CM | POA: Diagnosis not present

## 2022-03-02 DIAGNOSIS — J90 Pleural effusion, not elsewhere classified: Secondary | ICD-10-CM | POA: Diagnosis not present

## 2022-03-02 DIAGNOSIS — N179 Acute kidney failure, unspecified: Secondary | ICD-10-CM | POA: Diagnosis not present

## 2022-03-02 DIAGNOSIS — R6520 Severe sepsis without septic shock: Secondary | ICD-10-CM | POA: Diagnosis not present

## 2022-03-02 DIAGNOSIS — A419 Sepsis, unspecified organism: Secondary | ICD-10-CM | POA: Diagnosis not present

## 2022-03-02 LAB — LACTIC ACID, PLASMA: Lactic Acid, Venous: 3.1 mmol/L (ref 0.5–1.9)

## 2022-03-02 LAB — COMPREHENSIVE METABOLIC PANEL
ALT: 233 U/L — ABNORMAL HIGH (ref 0–44)
AST: 241 U/L — ABNORMAL HIGH (ref 15–41)
Albumin: 2.8 g/dL — ABNORMAL LOW (ref 3.5–5.0)
Alkaline Phosphatase: 93 U/L (ref 38–126)
Anion gap: 8 (ref 5–15)
BUN: 41 mg/dL — ABNORMAL HIGH (ref 8–23)
CO2: 23 mmol/L (ref 22–32)
Calcium: 8.3 mg/dL — ABNORMAL LOW (ref 8.9–10.3)
Chloride: 105 mmol/L (ref 98–111)
Creatinine, Ser: 2 mg/dL — ABNORMAL HIGH (ref 0.61–1.24)
GFR, Estimated: 32 mL/min — ABNORMAL LOW (ref >60)
Glucose, Bld: 119 mg/dL — ABNORMAL HIGH (ref 70–99)
Potassium: 4.5 mmol/L (ref 3.5–5.1)
Sodium: 136 mmol/L (ref 135–145)
Total Bilirubin: 0.9 mg/dL (ref 0.3–1.2)
Total Protein: 5.7 g/dL — ABNORMAL LOW (ref 6.5–8.1)

## 2022-03-02 LAB — BLOOD GAS, ARTERIAL
Acid-base deficit: 7.1 mmol/L — ABNORMAL HIGH (ref 0.0–2.0)
Bicarbonate: 16.8 mmol/L — ABNORMAL LOW (ref 20.0–28.0)
Drawn by: 22223
FIO2: 80 %
O2 Saturation: 99.8 %
Patient temperature: 36.6
pCO2 arterial: 28 mmHg — ABNORMAL LOW (ref 32–48)
pH, Arterial: 7.38 (ref 7.35–7.45)
pO2, Arterial: 315 mmHg — ABNORMAL HIGH (ref 83–108)

## 2022-03-02 LAB — PROCALCITONIN: Procalcitonin: 0.96 ng/mL

## 2022-03-02 LAB — CBC
HCT: 32.7 % — ABNORMAL LOW (ref 39.0–52.0)
Hemoglobin: 10 g/dL — ABNORMAL LOW (ref 13.0–17.0)
MCH: 29.2 pg (ref 26.0–34.0)
MCHC: 30.6 g/dL (ref 30.0–36.0)
MCV: 95.3 fL (ref 80.0–100.0)
Platelets: 123 10*3/uL — ABNORMAL LOW (ref 150–400)
RBC: 3.43 MIL/uL — ABNORMAL LOW (ref 4.22–5.81)
RDW: 16.7 % — ABNORMAL HIGH (ref 11.5–15.5)
WBC: 6.7 10*3/uL (ref 4.0–10.5)
nRBC: 0.9 % — ABNORMAL HIGH (ref 0.0–0.2)

## 2022-03-02 LAB — MAGNESIUM: Magnesium: 2.1 mg/dL (ref 1.7–2.4)

## 2022-03-02 LAB — GLUCOSE, CAPILLARY: Glucose-Capillary: 96 mg/dL (ref 70–99)

## 2022-03-02 MED ORDER — MIDODRINE HCL 5 MG PO TABS
10.0000 mg | ORAL_TABLET | Freq: Three times a day (TID) | ORAL | Status: DC
  Administered 2022-03-02 (×2): 10 mg via ORAL
  Filled 2022-03-02 (×3): qty 2

## 2022-03-02 NOTE — Progress Notes (Signed)
?PROGRESS NOTE ? ? ? ?Gary Odonnell  VOZ:[id number] DOB: 1932-09-11 DOA: 03/15/2022 ?PCP: Loman Brooklyn, FNP ? ? ?Brief Narrative:  ? ? ?Gary Odonnell is a 88 y.o. male with medical history significant of with history of atrial fibrillation, CHF-at baseline, COPD, CAD, HTN, HLD, PVD, CVA,  Diet-controlled DM II, presents the ED with a chief complaint of generalized weakness.  Patient reports he had generalized weakness for 2 days.  He had a fall as well.  He reports that his weakness is worse since it started.  He cannot get out of bed and he cannot get out of his chair.  He reports that he fell trying to get up.  Patient denies any pain.   ? He is on his baseline oxygen 3 L nasal cannula.  Patient reports that he has had dysuria and a significant decrease in urine output.  He also had diarrhea about 3 times in the last week.  He reports the diarrhea started 4 to days ago.  Patient reports he just does not know what is wrong, he feels so weak. ? ?Patient does not use drugs, does not drink alcohol.  He is a current smoker, half pack per day.  Patient is not interested in quitting.  Patient is not interested in a nicotine patch.  Patient is full code. ? ?-Patient was admitted with septic shock and noted to have organ dysfunction with AKI and elevated LFTs.  He was also noted to have troponin elevation and is now noted to have a drop in his LVEF on 2D echocardiogram.  He is also noted to have some ongoing diarrhea.  AKI is improving.  Cardiology planning for conservative management at this time.  His condition is overall declining and he is requiring norepinephrine for blood pressure support.  Noted to have pleural effusions bilaterally on chest x-ray.  Discussed case with PCCM as well as cardiology at [facility].  Patient noted to have high mortality and further discussion had with family members who are in agreement that patient will now be DNR and likely will require transition to comfort care in the next few  hours to days as it is unlikely that he will survive much longer.  ? ? ?Assessment & Plan: ?  ?Principal Problem: ?  Sepsis ([place]) ?Active Problems: ?  Elevated troponin ?  A-fib ([place]) ?  UTI (urinary tract infection) ?  Tobacco abuse ?  HTN (hypertension) ?  PAD (peripheral artery disease) ([place]) ?  Chronic obstructive pulmonary disease (HCC) ?  AKI (acute kidney injury) ([place]) ?  Diet-controlled diabetes mellitus ([place]) ?  Stage 3b chronic kidney disease ([place]) ?  Hyperkalemia ?  Pressure injury of skin ?  Diarrhea ? ?Assessment and Plan: ? ?Septic shock ?-Now with multiorgan dysfunction and possessing very high mortality, discussed with PCCM and cardiology on 4/8 ?-Repeat blood cultures due to ongoing fever ?  ?-Source of infection likely UTI, possibly pneumonia ?-O2 sats read as low as 70% the patient is on his baseline oxygen supplementation maintaining mid 90s oxygen sats ?  ?-Continue on Rocephin ?-Initial blood cultures with no growth thus far ?-Urine culture with yeast and patient started on Diflucan ?-Continue to monitor ?-Holding Aldactone and beta-blocker due to soft blood pressure readings, hold further IV fluid ? ?-Patient appears to have very high mortality and is unlikely to survive this hospitalization.  Discussed with PCCM and cardiology.  Patient and his wife agreed to DNR. ?  ?Elevated troponin ?Diminished LVEF with  wall motion abnormalities noted, but patient is not a candidate for cardiac catheterization ?Patient is already on aspirin and Eliquis ?Continue to monitor ?Denies any chest pain, subtle changes in EKG possible chronic ?Consult cardiology for evaluation appreciated with plans to resume low-dose Coreg 4/5 ?  ?Acute systolic CHF exacerbation ?-Seen on chest x-ray from overnight now requiring high flow nasal cannula oxygen ?-Worsening fluid accumulation noted on chest x-ray ?-Not a candidate for any further Lasix given significant hypotension/shock physiology ?  ?UTI (urinary tract  infection) ?- UA indicative of UTI ?-Urine culture with yeast noted ?-Continue Diflucan ?-Continue to monitor ?  ?A-fib ([place]) ?- Continue Eliquis ?-Currently rate controlled ?-Holding beta-blocker secondary to soft blood pressures at this time ?  ?  ?Tobacco abuse ?- Counseled on the importance of cessation ?-Patient is not interested in quitting ?-Not interested in nicotine patch for cravings ? ?  ?Diet-controlled diabetes mellitus ([place]) ?- Monitor blood glucose on chemistries ?  ?AKI (acute kidney injury) ([place]) ?On CKD stage IIIb ?- Creatinine currently elevated in the setting of septic shock ?Currently stable ?Hold further IV hydration given volume overload and CHF on chest x-ray ?Avoid nephrotoxic agents when possible ?  ?Chronic obstructive pulmonary disease (HCC) ?- Continue albuterol as needed ?-Continue Anoro ?-Currently requiring 15 L high flow nasal cannula due to worsening pleural effusion ?  ?PAD (peripheral artery disease) ([place]) ?- Continue aspirin, Eliquis, patient is not on a statin ?  ?  ?HTN (hypertension), currently with hypotension ?-Holding BP medications as patient currently has hypotension overnight on 4/6 ?-Started on norepinephrine ?  ?Ongoing diarrhea-improved ?-GI pathogen panel and C. difficile testing negative ?-Noted to start approximately 2 weeks ago ?-Started on Imodium scheduled without improvement ?-Tolerating diet without abdominal pain ?-Requested GI consultation 4/6 ?-Increase loperamide to 4 mg every 6 hours and added bismuth 2 tablets every 6 hours per GI recommendations ?-Appreciate further GI recommendations ?  ?Transaminitis-downtrending ?-Likely secondary to septic shock ?-Continue to trend ?-Ultrasound with some cholelithiasis, but no other significant findings noted. ?  ?  ?DVT prophylaxis: Eliquis ?Code Status: DNR ?Family Communication: Discussed with wife on phone 4/8 ?Disposition Plan:  ?Status is: Inpatient ?Remains inpatient appropriate because: Continues to require  IV medications. ?  ?Consultants:  ?Cardiology ?GI ?PCCM Dr. Doyne Keel 4/8 ?Palliative care ?  ?Procedures:  ?See below ? ? Antimicrobials:  ?Anti-infectives (From admission, onward)  ? ? Start     Dose/Rate Route Frequency Ordered Stop  ? 02/28/22 1745  fluconazole (DIFLUCAN) tablet 100 mg       ? 100 mg Oral Daily 02/28/22 1650 03/14/22 0959  ? 02/27/22 0000  cefTRIAXone (ROCEPHIN) 1 g in sodium chloride 0.9 % 100 mL IVPB       ? 1 g ?200 mL/hr over 30 Minutes Intravenous Every 24 hours 03/08/2022 0603    ? 03/04/2022 0800  azithromycin (ZITHROMAX) 500 mg in sodium chloride 0.9 % 250 mL IVPB  Status:  Discontinued       ? 500 mg ?250 mL/hr over 60 Minutes Intravenous Every 24 hours 03/01/2022 0753 03/11/2022 0804  ? 03/24/2022 0400  azithromycin (ZITHROMAX) 500 mg in sodium chloride 0.9 % 250 mL IVPB  Status:  Discontinued       ? 500 mg ?250 mL/hr over 60 Minutes Intravenous Every 24 hours 03/11/2022 0347 03/02/22 0810  ? 03/09/2022 0245  cefTRIAXone (ROCEPHIN) 2 g in sodium chloride 0.9 % 100 mL IVPB       ? 2 g ?200 mL/hr over  30 Minutes Intravenous  Once 03/10/2022 0240 03/07/2022 9150  ? ?  ? ? ?Subjective: ?Patient seen and evaluated today and is noted to remain hypotensive and is requiring high flow nasal cannula oxygen.  He denies any chest pain, or respiratory distress.  He is alert and oriented x3. ? ?Objective: ?Vitals:  ? 03/02/22 0800 03/02/22 0900 03/02/22 1000 03/02/22 1119  ?BP: (!) 89/58 (!) 75/62 (!) 67/50   ?Pulse: (!) 127 91 70 74  ?Resp: 17 20 (!) 24 19  ?Temp:    97.8 ?F (36.6 ?C)  ?TempSrc:    Oral  ?SpO2: (!) 89% 100% 100% 100%  ?Weight:      ?Height:      ? ? ?Intake/Output Summary (Last 24 hours) at 03/02/2022 1230 ?Last data filed at 03/02/2022 1023 ?Gross per 24 hour  ?Intake 726.2 ml  ?Output 600 ml  ?Net 126.2 ml  ? ?Filed Weights  ? 03/24/2022 0800 02/27/22 0500 02/28/22 0441  ?Weight: 75.3 kg 78 kg 79.3 kg  ? ? ?Examination: ? ?General exam: Appears calm and comfortable  ?Respiratory system: Diminished  bilaterally.  High flow nasal cannula oxygen 15 L ?Cardiovascular system: S1 & S2 heard, irregular rate ?Gastrointestinal system: Abdomen is soft ?Central nervous system: Alert and awake ?Extremities: No edema ?Skin: No

## 2022-03-02 NOTE — Progress Notes (Signed)
CRITICAL VALUE STICKER ? ?CRITICAL VALUE: Lactic acid 3.1 ? ?RECEIVER (on-site recipient of call): Freda Jackson RN ? ?DATE & TIME NOTIFIED: 03/02/22  @ 1107 ? ?MD NOTIFIED: Heath Lark DO ? ?TIME OF NOTIFICATION: 1108 ? ?

## 2022-03-02 NOTE — Plan of Care (Signed)
Patient still requiring levophed infusion. Plans to discharge home with hospice on Monday. ? ?Problem: Fluid Volume: ?Goal: Hemodynamic stability will improve ?Outcome: Not Progressing ?  ?Problem: Urinary Elimination: ?Goal: Signs and symptoms of infection will decrease ?Outcome: Not Progressing ?  ?Problem: Elimination: ?Goal: Will not experience complications related to bowel motility ?Outcome: Progressing ?  ?Problem: Activity: ?Goal: Risk for activity intolerance will decrease ?Outcome: Not Progressing ?  ?Problem: Elimination: ?Goal: Will not experience complications related to urinary retention ?Outcome: Completed/Met ?  ?Problem: Pain Managment: ?Goal: General experience of comfort will improve ?Outcome: Completed/Met ?  ?

## 2022-03-03 ENCOUNTER — Inpatient Hospital Stay (HOSPITAL_COMMUNITY): Payer: Medicare Other

## 2022-03-03 DIAGNOSIS — N179 Acute kidney failure, unspecified: Secondary | ICD-10-CM | POA: Diagnosis not present

## 2022-03-03 DIAGNOSIS — A419 Sepsis, unspecified organism: Secondary | ICD-10-CM | POA: Diagnosis not present

## 2022-03-03 DIAGNOSIS — R6520 Severe sepsis without septic shock: Secondary | ICD-10-CM | POA: Diagnosis not present

## 2022-03-03 LAB — COMPREHENSIVE METABOLIC PANEL
ALT: 895 U/L — ABNORMAL HIGH (ref 0–44)
AST: 1892 U/L — ABNORMAL HIGH (ref 15–41)
Albumin: 2.7 g/dL — ABNORMAL LOW (ref 3.5–5.0)
Alkaline Phosphatase: 124 U/L (ref 38–126)
Anion gap: 15 (ref 5–15)
BUN: 55 mg/dL — ABNORMAL HIGH (ref 8–23)
CO2: 16 mmol/L — ABNORMAL LOW (ref 22–32)
Calcium: 8.5 mg/dL — ABNORMAL LOW (ref 8.9–10.3)
Chloride: 106 mmol/L (ref 98–111)
Creatinine, Ser: 2.44 mg/dL — ABNORMAL HIGH (ref 0.61–1.24)
GFR, Estimated: 25 mL/min — ABNORMAL LOW (ref >60)
Glucose, Bld: 83 mg/dL (ref 70–99)
Potassium: 5.5 mmol/L — ABNORMAL HIGH (ref 3.5–5.1)
Sodium: 137 mmol/L (ref 135–145)
Total Bilirubin: 1 mg/dL (ref 0.3–1.2)
Total Protein: 5.7 g/dL — ABNORMAL LOW (ref 6.5–8.1)

## 2022-03-03 LAB — CBC
HCT: 35.5 % — ABNORMAL LOW (ref 39.0–52.0)
Hemoglobin: 10.9 g/dL — ABNORMAL LOW (ref 13.0–17.0)
MCH: 29.1 pg (ref 26.0–34.0)
MCHC: 30.7 g/dL (ref 30.0–36.0)
MCV: 94.7 fL (ref 80.0–100.0)
Platelets: 161 10*3/uL (ref 150–400)
RBC: 3.75 MIL/uL — ABNORMAL LOW (ref 4.22–5.81)
RDW: 16.5 % — ABNORMAL HIGH (ref 11.5–15.5)
WBC: 7 10*3/uL (ref 4.0–10.5)
nRBC: 2.3 % — ABNORMAL HIGH (ref 0.0–0.2)

## 2022-03-03 LAB — CULTURE, BLOOD (ROUTINE X 2)
Culture: NO GROWTH
Culture: NO GROWTH
Special Requests: ADEQUATE
Special Requests: ADEQUATE

## 2022-03-03 LAB — HEPATITIS PANEL, ACUTE
HCV Ab: NONREACTIVE
Hep A IgM: NONREACTIVE
Hep B C IgM: NONREACTIVE
Hepatitis B Surface Ag: NONREACTIVE

## 2022-03-03 LAB — MAGNESIUM: Magnesium: 2.3 mg/dL (ref 1.7–2.4)

## 2022-03-03 MED ORDER — DEXTROSE 50 % IV SOLN
1.0000 | Freq: Once | INTRAVENOUS | Status: AC
  Administered 2022-03-03: 50 mL via INTRAVENOUS
  Filled 2022-03-03: qty 50

## 2022-03-03 MED ORDER — INSULIN ASPART 100 UNIT/ML IV SOLN
10.0000 [IU] | Freq: Once | INTRAVENOUS | Status: AC
  Administered 2022-03-03: 10 [IU] via INTRAVENOUS

## 2022-03-03 MED ORDER — SODIUM ZIRCONIUM CYCLOSILICATE 10 G PO PACK
10.0000 g | PACK | Freq: Once | ORAL | Status: AC
  Administered 2022-03-03: 10 g via ORAL
  Filled 2022-03-03: qty 1

## 2022-03-07 LAB — CULTURE, BLOOD (ROUTINE X 2)
Culture: NO GROWTH
Culture: NO GROWTH
Special Requests: ADEQUATE
Special Requests: ADEQUATE

## 2022-03-12 DIAGNOSIS — Z20822 Contact with and (suspected) exposure to covid-19: Secondary | ICD-10-CM | POA: Diagnosis not present

## 2022-03-25 NOTE — Death Summary Note (Signed)
? ?DEATH SUMMARY  ? ?Patient Details  ?Name: Gary Odonnell ?MRN: [id number] ?DOB: 01-24-1932 ?CBS:WHQPR, Shireen Quan, FNP ?Admission/Discharge Information  ? ?Admit Date:  03-18-22  ?Date of Death: Date of Death: 2022/03/23  ?Time of Death: Time of Death: 59  ?Length of Stay: 5  ? ?Principle Cause of death: Septic shock with acute systolic CHF decompensation ? ?Hospital Diagnoses: ?Principal Problem: ?  Sepsis ([place]) ?Active Problems: ?  Elevated troponin ?  A-fib ([place]) ?  UTI (urinary tract infection) ?  Tobacco abuse ?  HTN (hypertension) ?  PAD (peripheral artery disease) ([place]) ?  Chronic obstructive pulmonary disease ([facility]) ?  AKI (acute kidney injury) ([place]) ?  Diet-controlled diabetes mellitus ([place]) ?  Stage 3b chronic kidney disease ([place]) ?  Hyperkalemia ?  Pressure injury of skin ?  Diarrhea ? ? ?Hospital Course: ? ? ?Gary Odonnell is a 88 y.o. male with medical history significant of with history of atrial fibrillation, CHF-at baseline, COPD, CAD, HTN, HLD, PVD, CVA,  Diet-controlled DM II, presents the ED with a chief complaint of generalized weakness.  Patient reports he had generalized weakness for 2 days.  He had a fall as well.  He reports that his weakness is worse since it started.  He cannot get out of bed and he cannot get out of his chair.  He reports that he fell trying to get up.  Patient denies any pain.   ? He is on his baseline oxygen 3 L nasal cannula.  Patient reports that he has had dysuria and a significant decrease in urine output.  He also had diarrhea about 3 times in the last week.  He reports the diarrhea started 4 to days ago.  Patient reports he just does not know what is wrong, he feels so weak. ? ?Patient does not use drugs, does not drink alcohol.  He is a current smoker, half pack per day.  Patient is not interested in quitting.  Patient is not interested in a nicotine patch.  Patient is full code. ? ?-Patient was admitted with septic shock and noted to have organ dysfunction with  AKI and elevated LFTs.  He was also noted to have troponin elevation and was subsequently noted to have a drop in his LVEF to 30% on 2D echocardiogram.  Patient was noted to be a very poor candidate for any invasive measures given history of CAD with prior CABG and is only noted to have 1 out of his 4 grafts patent.  He was managed conservatively by cardiology and ultimately developed worsening CHF symptoms for which she was given some Lasix, but without any improvement.  He required initiation of norepinephrine for blood pressure support and continued to have worsening multiorgan dysfunction to include elevating creatinine levels as well as liver enzymes.  Discussed case with PCCM as well as cardiology at [facility] on 4/8 and services were in agreement that patient was too high risk for transfer and had significantly elevated mortality and should be transition to DNR and comfort measures if possible.   ? ?Discussion was then had with family members on 4/8 as well as patient who agreed to DNR and understood that he likely will not survive past a few hours to days.  Patient wanted to go home for hospice care, but could not given his hemodynamic instability, but attempts were being made to try to wean off of his norepinephrine in order to allow this possibility.  He unfortunately continued to require norepinephrine  infusion overnight and the following morning on 4/9 expired at 0836. ? ? ?Assessment and Plan: ? ?Septic shock with multiorgan dysfunction and acute systolic CHF exacerbation. ? ? ?  ? ? ?Procedures: None ? ?Consultations: Cardiology, GI, discussed with PCCM Dr. Doyne Keel 4/8, Palliative care ? ?The results of significant diagnostics from this hospitalization (including imaging, microbiology, ancillary and laboratory) are listed below for reference.  ? ?Significant Diagnostic Studies: ?DG Chest 1 View ? ?Result Date: 03/02/2022 ?CLINICAL DATA:  Shortness of breath EXAM: CHEST  1 VIEW COMPARISON:  Yesterday  FINDINGS: Hazy opacification of the bilateral lower chest with superimposed airspace and interstitial type densities. Chronic cardiomegaly. Prior median sternotomy. No pneumothorax. IMPRESSION: Layering pleural effusions. Superimposed pulmonary opacity that could be infection or edema. Electronically Signed   By: Jorje Guild M.D.   On: 03/02/2022 06:44  ? ?DG Chest 1 View ? ?Result Date: 03/01/2022 ?CLINICAL DATA:  Increased shortness of breath EXAM: CHEST  1 VIEW COMPARISON:  03/01/2022 FINDINGS: Cardiomegaly. Diffuse interstitial coarsening with small pleural effusions and cephalized blood flow. Prior median sternotomy. No pneumothorax. IMPRESSION: CHF pattern. Electronically Signed   By: Jorje Guild M.D.   On: 03/01/2022 06:47  ? ?DG Chest 2 View ? ?Result Date: 03/07/2022 ?CLINICAL DATA:  Cough and weakness EXAM: CHEST - 2 VIEW COMPARISON:  02/08/2022 FINDINGS: Mild cardiomegaly. Right basilar opacities. No sizable pleural effusion. No pneumothorax. Remote median sternotomy. Chronic wedge compression deformity of the lower thoracic spine. IMPRESSION: Right basilar opacities, which may indicate developing infection or atelectasis. Electronically Signed   By: Ulyses Jarred M.D.   On: 02/27/2022 03:42  ? ?CT HEAD WO CONTRAST ? ?Result Date: 02/25/2022 ?CLINICAL DATA:  Head trauma, minor (Age >= 65y).  Fall EXAM: CT HEAD WITHOUT CONTRAST TECHNIQUE: Contiguous axial images were obtained from the base of the skull through the vertex without intravenous contrast. RADIATION DOSE REDUCTION: This exam was performed according to the departmental dose-optimization program which includes automated exposure control, adjustment of the mA and/or kV according to patient size and/or use of iterative reconstruction technique. COMPARISON:  01/10/2015 FINDINGS: Brain: Old left frontal infarct, unchanged. There is atrophy and chronic small vessel disease changes. No acute intracranial abnormality. Specifically, no hemorrhage,  hydrocephalus, mass lesion, acute infarction, or significant intracranial injury. Vascular: No hyperdense vessel or unexpected calcification. Skull: No acute calvarial abnormality. Sinuses/Orbits: No acute findings Other: None IMPRESSION: Chronic left frontal infarct, stable. Atrophy, chronic microvascular disease. No acute intracranial abnormality. Electronically Signed   By: Rolm Baptise M.D.   On: 03/09/2022 03:32  ? ?DG Chest Port 1 View ? ?Result Date: 02/08/2022 ?CLINICAL DATA:  Shortness of breath and congestion. History of COPD and CHF. EXAM: PORTABLE CHEST 1 VIEW COMPARISON:  09/04/2021 FINDINGS: Patient is post median sternotomy. Borderline cardiomegaly with stable mediastinal contours. Small left pleural effusion and adjacent basilar opacity, likely atelectasis. Trace right pleural effusion. There is mild chronic bronchial thickening. No pneumothorax. No acute osseous findings. IMPRESSION: 1. Small left pleural effusion and adjacent basilar opacity, likely atelectasis. Trace right pleural effusion. 2. Chronic bronchial thickening likely related to COPD. Electronically Signed   By: Keith Rake M.D.   On: 02/08/2022 20:06  ? ?ECHOCARDIOGRAM COMPLETE ? ?Result Date: 02/25/2022 ?   ECHOCARDIOGRAM REPORT   Patient Name:   Gary Odonnell Date of Exam: 03/15/2022 Medical Rec #:  [id number]      Height:       71.0 in Accession #:    [id number]  Weight:       166.0 lb Date of Birth:  1932/09/21     BSA:          1.948 m? Patient Age:    37 years       BP:           122/50 mmHg Patient Gender: M              HR:           77 bpm. Exam Location:  [facility] Procedure: 2D Echo, Cardiac Doppler and Color Doppler Indications:    Elevated Troponin  History:        Patient has prior history of Echocardiogram examinations, most                 recent 10/07/2020. CHF, CAD and Previous Myocardial Infarction,                 Prior CABG, PAD, COPD and Stroke, Arrythmias:Atrial                 Fibrillation; Risk  Factors:Hypertension, Diabetes and Current                 Smoker. Lung CA. Images by Lonn Georgia, student.  Sonographer:    Wenda Low Referring Phys: [phone] SEYED A SHAHMEHDI IMPRESSIONS  1. Left ventricular ejection

## 2022-03-25 NOTE — Progress Notes (Signed)
Patient has become weaker than yesterday. States he cannot swallow pills and is having some difficulty breathing even after getting breathing treatments from respiratory. Refuses to eat breakfast or take water.  ?

## 2022-03-25 NOTE — Progress Notes (Signed)
Pharmacy Antibiotic Note ? ?Gary Odonnell is a 88 y.o. male admitted on 03/01/2022 with UTI.  Pharmacy has been consulted for fluconazole dosing. ? ?Plan: ?Fluconazole 100mg  po daily x 14 days ? ?Height: 5\' 11"  (180.3 cm) ?Weight: 72.8 kg (160 lb 7.9 oz) ?IBW/kg (Calculated) : 75.3 ? ?Temp (24hrs), Avg:99.2 ?F (37.3 ?C), Min:97.8 ?F (36.6 ?C), Max:100.7 ?F (38.2 ?C) ? ?Recent Labs  ?Lab 03/12/2022 ?0446 02/28/2022 ?1610 03/18/2022 ?9604 03/01/2022 ?1136 02/27/22 ?0402 02/28/22 ?0406 03/01/22 ?5409 03/02/22 ?8119 03/02/22 ?1478 March 07, 2022 ?2956  ?WBC  --   --   --   --  7.4 6.1 5.4 6.7  --  7.0  ?CREATININE  --   --   --   --  1.73* 1.60* 1.70* 2.00*  --  2.44*  ?LATICACIDVEN 5.8* 4.1* 2.8* 1.9  --   --   --   --  3.1*  --   ? ?  ?Estimated Creatinine Clearance: 22 mL/min (A) (by C-G formula based on SCr of 2.44 mg/dL (H)).   ? ?Allergies  ?Allergen Reactions  ? Statins Other (See Comments)  ?  Intolerance per outpt Cardiology notes  ? Aldactone [Spironolactone] Diarrhea  ? ? ?Antimicrobials this admission: ?4/6 fluconazole >>  ?4/4 ceftriaxone >>  ?4/4 azithromycin >> 4/9 ? ?Microbiology results: ?4/4 BCx: NGTD ?4/4 UCx: 60,000 colonies yeast  ?4/4 MRSA PCR: negative  ? ?Thank you for allowing pharmacy to be a part of this patient?s care. ? ?Donna Christen Mackensie Pilson ?03-07-2022 8:07 AM ? ?

## 2022-03-25 DEATH — deceased

## 2022-04-02 ENCOUNTER — Encounter (INDEPENDENT_AMBULATORY_CARE_PROVIDER_SITE_OTHER): Payer: Medicare Other | Admitting: Ophthalmology

## 2022-06-03 ENCOUNTER — Ambulatory Visit: Payer: Medicare Other | Admitting: Cardiology
